# Patient Record
Sex: Female | Born: 1965 | Race: White | Hispanic: No | Marital: Single
Health system: Southern US, Academic
[De-identification: ages and names within clinical notes are randomized; demographics above are authoritative.]

## PROBLEM LIST (undated history)

## (undated) ENCOUNTER — Encounter: Attending: Hematology & Oncology | Primary: Hematology & Oncology

## (undated) ENCOUNTER — Ambulatory Visit

## (undated) ENCOUNTER — Telehealth

## (undated) ENCOUNTER — Encounter: Attending: Adult Health | Primary: Adult Health

## (undated) ENCOUNTER — Encounter

## (undated) ENCOUNTER — Ambulatory Visit: Payer: MEDICARE

## (undated) ENCOUNTER — Telehealth: Attending: Hematology & Oncology | Primary: Hematology & Oncology

## (undated) ENCOUNTER — Telehealth: Attending: Adult Health | Primary: Adult Health

## (undated) ENCOUNTER — Ambulatory Visit: Payer: MEDICARE | Attending: Physical Medicine & Rehabilitation | Primary: Physical Medicine & Rehabilitation

## (undated) ENCOUNTER — Non-Acute Institutional Stay: Payer: MEDICARE

## (undated) ENCOUNTER — Ambulatory Visit: Attending: Hematology & Oncology | Primary: Hematology & Oncology

## (undated) ENCOUNTER — Encounter: Attending: Physical Medicine & Rehabilitation | Primary: Physical Medicine & Rehabilitation

## (undated) ENCOUNTER — Encounter: Payer: MEDICARE | Attending: Hematology & Oncology | Primary: Hematology & Oncology

## (undated) ENCOUNTER — Telehealth: Attending: Family Medicine | Primary: Family Medicine

## (undated) ENCOUNTER — Telehealth: Attending: Hematology | Primary: Hematology

## (undated) ENCOUNTER — Ambulatory Visit: Attending: Adult Health | Primary: Adult Health

## (undated) ENCOUNTER — Ambulatory Visit: Payer: MEDICARE | Attending: Hematology & Oncology | Primary: Hematology & Oncology

## (undated) ENCOUNTER — Ambulatory Visit: Payer: MEDICARE | Attending: Physician Assistant | Primary: Physician Assistant

## (undated) ENCOUNTER — Ambulatory Visit: Payer: Medicare (Managed Care)

## (undated) ENCOUNTER — Encounter: Payer: MEDICARE | Attending: Urology | Primary: Urology

## (undated) ENCOUNTER — Ambulatory Visit: Payer: MEDICARE | Attending: Hematology | Primary: Hematology

## (undated) ENCOUNTER — Encounter: Payer: MEDICARE | Attending: Anesthesiology | Primary: Anesthesiology

## (undated) DIAGNOSIS — D759 Disease of blood and blood-forming organs, unspecified: Secondary | ICD-10-CM

## (undated) DIAGNOSIS — N390 Urinary tract infection, site not specified: Secondary | ICD-10-CM

## (undated) DIAGNOSIS — J449 Chronic obstructive pulmonary disease, unspecified: Secondary | ICD-10-CM

## (undated) DIAGNOSIS — F419 Anxiety disorder, unspecified: Secondary | ICD-10-CM

## (undated) DIAGNOSIS — F329 Major depressive disorder, single episode, unspecified: Secondary | ICD-10-CM

## (undated) DIAGNOSIS — M545 Low back pain, unspecified: Secondary | ICD-10-CM

## (undated) DIAGNOSIS — F32A Depression, unspecified: Secondary | ICD-10-CM

## (undated) DIAGNOSIS — N189 Chronic kidney disease, unspecified: Secondary | ICD-10-CM

## (undated) DIAGNOSIS — E119 Type 2 diabetes mellitus without complications: Secondary | ICD-10-CM

## (undated) DIAGNOSIS — I10 Essential (primary) hypertension: Secondary | ICD-10-CM

## (undated) DIAGNOSIS — R5383 Other fatigue: Secondary | ICD-10-CM

## (undated) DIAGNOSIS — R0683 Snoring: Secondary | ICD-10-CM

## (undated) DIAGNOSIS — D6800 Von Willebrand disease, unspecified: Secondary | ICD-10-CM

## (undated) DIAGNOSIS — R059 Cough, unspecified: Secondary | ICD-10-CM

## (undated) DIAGNOSIS — R0602 Shortness of breath: Secondary | ICD-10-CM

## (undated) DIAGNOSIS — H5509 Other forms of nystagmus: Secondary | ICD-10-CM

## (undated) DIAGNOSIS — R739 Hyperglycemia, unspecified: Secondary | ICD-10-CM

## (undated) DIAGNOSIS — R109 Unspecified abdominal pain: Secondary | ICD-10-CM

## (undated) DIAGNOSIS — M542 Cervicalgia: Secondary | ICD-10-CM

## (undated) DIAGNOSIS — J3489 Other specified disorders of nose and nasal sinuses: Secondary | ICD-10-CM

## (undated) DIAGNOSIS — Z720 Tobacco use: Secondary | ICD-10-CM

## (undated) DIAGNOSIS — J45909 Unspecified asthma, uncomplicated: Secondary | ICD-10-CM

## (undated) DIAGNOSIS — D699 Hemorrhagic condition, unspecified: Secondary | ICD-10-CM

## (undated) DIAGNOSIS — R51 Headache: Secondary | ICD-10-CM

## (undated) DIAGNOSIS — R928 Other abnormal and inconclusive findings on diagnostic imaging of breast: Secondary | ICD-10-CM

## (undated) HISTORY — DX: Von Willebrand disease, unspecified: D68.00

## (undated) HISTORY — DX: Chronic obstructive pulmonary disease, unspecified: J44.9

## (undated) HISTORY — DX: Snoring: R06.83

## (undated) HISTORY — DX: Tobacco use: Z72.0

## (undated) HISTORY — DX: Other fatigue: R53.83

## (undated) HISTORY — DX: Other forms of nystagmus: H55.09

## (undated) HISTORY — DX: Hyperglycemia, unspecified: R73.9

## (undated) HISTORY — PX: SHOULDER SURGERY: SHX246

## (undated) HISTORY — DX: Major depressive disorder, single episode, unspecified: F32.9

## (undated) HISTORY — DX: Urinary tract infection, site not specified: N39.0

## (undated) HISTORY — PX: APPENDECTOMY: SHX54

## (undated) HISTORY — DX: Low back pain, unspecified: M54.50

## (undated) HISTORY — DX: Cough, unspecified: R05.9

## (undated) HISTORY — DX: Cervicalgia: M54.2

## (undated) HISTORY — DX: Von Willebrand's disease: D68.0

## (undated) HISTORY — PX: BLADDER SURGERY: SHX569

## (undated) HISTORY — DX: Other specified disorders of nose and nasal sinuses: J34.89

## (undated) HISTORY — DX: Shortness of breath: R06.02

## (undated) HISTORY — DX: Headache: R51

## (undated) HISTORY — DX: Other abnormal and inconclusive findings on diagnostic imaging of breast: R92.8

## (undated) HISTORY — PX: ABDOMINAL HYSTERECTOMY: SHX81

## (undated) HISTORY — DX: Unspecified abdominal pain: R10.9

## (undated) HISTORY — PX: BREAST BIOPSY: SHX20

## (undated) HISTORY — PX: REPLACEMENT TOTAL KNEE: SUR1224

## (undated) HISTORY — PX: CARDIAC CATHETERIZATION: SHX172

## (undated) MED ORDER — DOXYCYCLINE HYCLATE 100 MG TABLET
Freq: Two times a day (BID) | ORAL | 0 days
Start: ? — End: 2020-11-22

---
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
## 1998-10-28 DIAGNOSIS — R519 Headache, unspecified: Secondary | ICD-10-CM

## 1998-10-28 HISTORY — DX: Headache, unspecified: R51.9

## 2000-10-28 HISTORY — PX: ABDOMINAL HYSTERECTOMY: SUR658

## 2004-07-28 ENCOUNTER — Ambulatory Visit: Payer: Self-pay | Admitting: Internal Medicine

## 2004-08-16 ENCOUNTER — Inpatient Hospital Stay: Payer: Self-pay | Admitting: Unknown Physician Specialty

## 2004-08-28 ENCOUNTER — Ambulatory Visit: Payer: Self-pay | Admitting: Internal Medicine

## 2004-09-27 ENCOUNTER — Ambulatory Visit: Payer: Self-pay | Admitting: Internal Medicine

## 2004-10-28 ENCOUNTER — Ambulatory Visit: Payer: Self-pay | Admitting: Internal Medicine

## 2004-11-01 ENCOUNTER — Ambulatory Visit: Payer: Self-pay | Admitting: Family Medicine

## 2004-11-20 ENCOUNTER — Ambulatory Visit: Payer: Self-pay | Admitting: Pain Medicine

## 2004-11-28 ENCOUNTER — Ambulatory Visit: Payer: Self-pay | Admitting: Internal Medicine

## 2004-12-18 ENCOUNTER — Ambulatory Visit: Payer: Self-pay | Admitting: Pain Medicine

## 2004-12-24 ENCOUNTER — Ambulatory Visit: Payer: Self-pay | Admitting: Pain Medicine

## 2004-12-26 ENCOUNTER — Ambulatory Visit: Payer: Self-pay | Admitting: Internal Medicine

## 2005-01-31 ENCOUNTER — Ambulatory Visit: Payer: Self-pay | Admitting: Pain Medicine

## 2005-02-04 ENCOUNTER — Ambulatory Visit: Payer: Self-pay | Admitting: Pain Medicine

## 2005-02-06 ENCOUNTER — Ambulatory Visit: Payer: Self-pay | Admitting: Pain Medicine

## 2005-02-11 ENCOUNTER — Ambulatory Visit: Payer: Self-pay | Admitting: Internal Medicine

## 2005-02-25 ENCOUNTER — Ambulatory Visit: Payer: Self-pay | Admitting: Internal Medicine

## 2005-03-28 ENCOUNTER — Ambulatory Visit: Payer: Self-pay | Admitting: Internal Medicine

## 2005-04-27 ENCOUNTER — Ambulatory Visit: Payer: Self-pay | Admitting: Internal Medicine

## 2005-05-28 ENCOUNTER — Ambulatory Visit: Payer: Self-pay | Admitting: Internal Medicine

## 2005-06-28 ENCOUNTER — Ambulatory Visit: Payer: Self-pay | Admitting: Internal Medicine

## 2005-07-28 ENCOUNTER — Ambulatory Visit: Payer: Self-pay | Admitting: Internal Medicine

## 2005-08-28 ENCOUNTER — Ambulatory Visit: Payer: Self-pay | Admitting: Internal Medicine

## 2005-09-27 ENCOUNTER — Ambulatory Visit: Payer: Self-pay | Admitting: Internal Medicine

## 2005-10-28 ENCOUNTER — Ambulatory Visit: Payer: Self-pay | Admitting: Internal Medicine

## 2005-12-02 ENCOUNTER — Ambulatory Visit: Payer: Self-pay | Admitting: Internal Medicine

## 2005-12-22 ENCOUNTER — Emergency Department: Payer: Self-pay | Admitting: Emergency Medicine

## 2005-12-30 ENCOUNTER — Ambulatory Visit: Payer: Self-pay | Admitting: Internal Medicine

## 2006-01-13 ENCOUNTER — Ambulatory Visit: Payer: Self-pay | Admitting: Unknown Physician Specialty

## 2006-01-30 ENCOUNTER — Ambulatory Visit: Payer: Self-pay | Admitting: Unknown Physician Specialty

## 2006-02-09 ENCOUNTER — Emergency Department: Payer: Self-pay | Admitting: Emergency Medicine

## 2006-02-10 ENCOUNTER — Ambulatory Visit: Payer: Self-pay | Admitting: Internal Medicine

## 2006-02-25 ENCOUNTER — Ambulatory Visit: Payer: Self-pay | Admitting: Unknown Physician Specialty

## 2006-02-25 ENCOUNTER — Ambulatory Visit: Payer: Self-pay | Admitting: Internal Medicine

## 2006-03-28 ENCOUNTER — Ambulatory Visit: Payer: Self-pay | Admitting: Internal Medicine

## 2006-04-09 ENCOUNTER — Encounter: Payer: Self-pay | Admitting: Urology

## 2006-04-27 ENCOUNTER — Encounter: Payer: Self-pay | Admitting: Urology

## 2006-05-06 ENCOUNTER — Inpatient Hospital Stay: Payer: Self-pay | Admitting: Internal Medicine

## 2006-05-06 ENCOUNTER — Other Ambulatory Visit: Payer: Self-pay

## 2006-05-12 ENCOUNTER — Ambulatory Visit: Payer: Self-pay | Admitting: Internal Medicine

## 2006-06-02 ENCOUNTER — Ambulatory Visit: Payer: Self-pay | Admitting: Internal Medicine

## 2006-06-11 ENCOUNTER — Ambulatory Visit: Payer: Self-pay | Admitting: Gastroenterology

## 2006-06-28 ENCOUNTER — Ambulatory Visit: Payer: Self-pay | Admitting: Internal Medicine

## 2006-06-28 ENCOUNTER — Emergency Department: Payer: Self-pay | Admitting: Unknown Physician Specialty

## 2006-07-28 ENCOUNTER — Ambulatory Visit: Payer: Self-pay | Admitting: Internal Medicine

## 2006-09-03 ENCOUNTER — Ambulatory Visit: Payer: Self-pay | Admitting: Internal Medicine

## 2006-09-05 ENCOUNTER — Emergency Department: Payer: Self-pay | Admitting: Emergency Medicine

## 2006-09-07 ENCOUNTER — Emergency Department: Payer: Self-pay | Admitting: Emergency Medicine

## 2006-09-27 ENCOUNTER — Ambulatory Visit: Payer: Self-pay | Admitting: Internal Medicine

## 2006-11-11 ENCOUNTER — Ambulatory Visit: Payer: Self-pay

## 2006-11-14 ENCOUNTER — Ambulatory Visit: Payer: Self-pay | Admitting: Internal Medicine

## 2006-11-28 ENCOUNTER — Ambulatory Visit: Payer: Self-pay | Admitting: Internal Medicine

## 2006-12-13 ENCOUNTER — Other Ambulatory Visit: Payer: Self-pay

## 2006-12-13 ENCOUNTER — Emergency Department: Payer: Self-pay

## 2007-01-02 ENCOUNTER — Ambulatory Visit: Payer: Self-pay | Admitting: Internal Medicine

## 2007-01-10 ENCOUNTER — Emergency Department: Payer: Self-pay | Admitting: Emergency Medicine

## 2007-01-12 ENCOUNTER — Emergency Department: Payer: Self-pay | Admitting: Emergency Medicine

## 2007-01-13 ENCOUNTER — Other Ambulatory Visit: Payer: Self-pay

## 2007-01-13 ENCOUNTER — Inpatient Hospital Stay: Payer: Self-pay | Admitting: Unknown Physician Specialty

## 2007-01-13 ENCOUNTER — Ambulatory Visit: Payer: Self-pay | Admitting: Internal Medicine

## 2007-01-20 ENCOUNTER — Inpatient Hospital Stay: Payer: Self-pay | Admitting: Internal Medicine

## 2007-01-27 ENCOUNTER — Ambulatory Visit: Payer: Self-pay | Admitting: Internal Medicine

## 2007-02-03 ENCOUNTER — Emergency Department: Payer: Self-pay | Admitting: Emergency Medicine

## 2007-02-03 ENCOUNTER — Other Ambulatory Visit: Payer: Self-pay

## 2007-02-26 ENCOUNTER — Ambulatory Visit: Payer: Self-pay | Admitting: Internal Medicine

## 2007-03-29 ENCOUNTER — Ambulatory Visit: Payer: Self-pay | Admitting: Internal Medicine

## 2007-04-24 ENCOUNTER — Ambulatory Visit: Payer: Self-pay | Admitting: Internal Medicine

## 2007-04-24 ENCOUNTER — Emergency Department: Payer: Self-pay | Admitting: Emergency Medicine

## 2007-04-28 ENCOUNTER — Ambulatory Visit: Payer: Self-pay | Admitting: Internal Medicine

## 2007-05-29 ENCOUNTER — Ambulatory Visit: Payer: Self-pay | Admitting: Internal Medicine

## 2007-06-29 ENCOUNTER — Ambulatory Visit: Payer: Self-pay | Admitting: Internal Medicine

## 2007-07-29 ENCOUNTER — Ambulatory Visit: Payer: Self-pay | Admitting: Internal Medicine

## 2007-08-29 ENCOUNTER — Ambulatory Visit: Payer: Self-pay | Admitting: Internal Medicine

## 2007-09-28 ENCOUNTER — Ambulatory Visit: Payer: Self-pay | Admitting: Internal Medicine

## 2007-10-01 ENCOUNTER — Ambulatory Visit: Payer: Self-pay | Admitting: Internal Medicine

## 2007-10-29 ENCOUNTER — Ambulatory Visit: Payer: Self-pay | Admitting: Internal Medicine

## 2007-11-29 ENCOUNTER — Ambulatory Visit: Payer: Self-pay | Admitting: Internal Medicine

## 2007-12-07 ENCOUNTER — Ambulatory Visit: Payer: Self-pay | Admitting: Unknown Physician Specialty

## 2007-12-14 ENCOUNTER — Other Ambulatory Visit: Payer: Self-pay

## 2007-12-14 ENCOUNTER — Ambulatory Visit: Payer: Self-pay | Admitting: Unknown Physician Specialty

## 2007-12-18 ENCOUNTER — Inpatient Hospital Stay: Payer: Self-pay | Admitting: Unknown Physician Specialty

## 2007-12-27 ENCOUNTER — Ambulatory Visit: Payer: Self-pay | Admitting: Internal Medicine

## 2008-01-27 ENCOUNTER — Ambulatory Visit: Payer: Self-pay | Admitting: Internal Medicine

## 2008-02-04 ENCOUNTER — Ambulatory Visit: Payer: Self-pay | Admitting: Family Medicine

## 2008-02-25 ENCOUNTER — Other Ambulatory Visit: Payer: Self-pay

## 2008-02-25 ENCOUNTER — Emergency Department: Payer: Self-pay | Admitting: Internal Medicine

## 2008-02-26 ENCOUNTER — Ambulatory Visit: Payer: Self-pay | Admitting: Internal Medicine

## 2008-02-26 ENCOUNTER — Other Ambulatory Visit: Payer: Self-pay

## 2008-02-26 ENCOUNTER — Emergency Department: Payer: Self-pay | Admitting: Emergency Medicine

## 2008-02-29 ENCOUNTER — Other Ambulatory Visit: Payer: Self-pay

## 2008-02-29 ENCOUNTER — Inpatient Hospital Stay: Payer: Self-pay | Admitting: Internal Medicine

## 2008-03-31 ENCOUNTER — Ambulatory Visit: Payer: Self-pay | Admitting: Internal Medicine

## 2008-04-27 ENCOUNTER — Ambulatory Visit: Payer: Self-pay | Admitting: Internal Medicine

## 2008-08-08 ENCOUNTER — Inpatient Hospital Stay: Payer: Self-pay | Admitting: Unknown Physician Specialty

## 2008-08-28 ENCOUNTER — Ambulatory Visit: Payer: Self-pay | Admitting: Internal Medicine

## 2008-09-15 ENCOUNTER — Ambulatory Visit: Payer: Self-pay | Admitting: Internal Medicine

## 2008-09-27 ENCOUNTER — Ambulatory Visit: Payer: Self-pay | Admitting: Internal Medicine

## 2008-10-28 ENCOUNTER — Ambulatory Visit: Payer: Self-pay | Admitting: Internal Medicine

## 2008-11-28 ENCOUNTER — Ambulatory Visit: Payer: Self-pay | Admitting: Internal Medicine

## 2008-12-26 ENCOUNTER — Ambulatory Visit: Payer: Self-pay | Admitting: Internal Medicine

## 2009-01-26 ENCOUNTER — Ambulatory Visit: Payer: Self-pay | Admitting: Internal Medicine

## 2009-02-12 IMAGING — CT CT OF THE LEFT KNEE WITHOUT CONTRAST
1 series · 12 of 14 positions shown, 15 images · non-contrast
Comparison: Correlated with recent plain film examination.

REASON FOR EXAM: fall  x ray shows depression   laterally
COMMENTS:

PROCEDURE:     CT  - CT KNEE LEFT WO  - January 10, 2007  [DATE]
RESULT:
HISTORY: Fall.
TECHNIQUE: Contiguous axial CT images of the LEFT knee were obtained without
intravenous contrast.  Images were viewed on a workstation in sagittal and
coronal planes for interpretation. In addition, a 3D model was
reconstructed.

[Series 4: bone windows · axial · 0.39mm/px · z∈[+220,+364]mm · 12 of 58 slices shown, 15 images]
[im 5/58  soft-tissue]
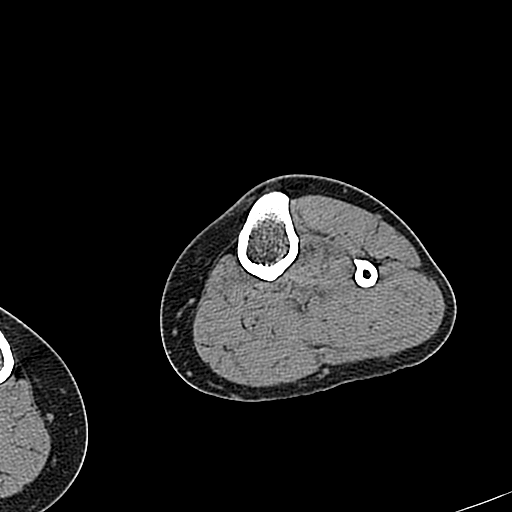
[im 5/58  bone]
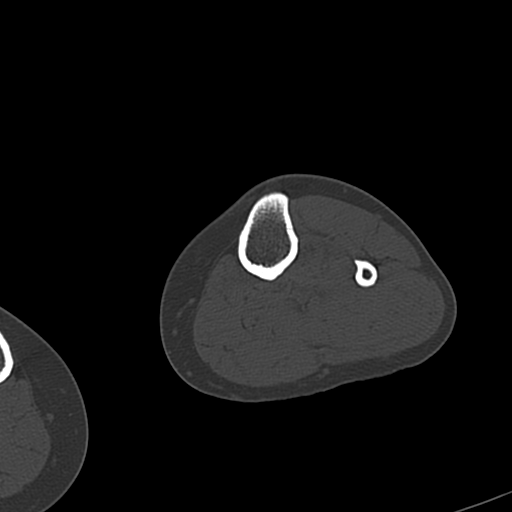
[im 9/58  bone]
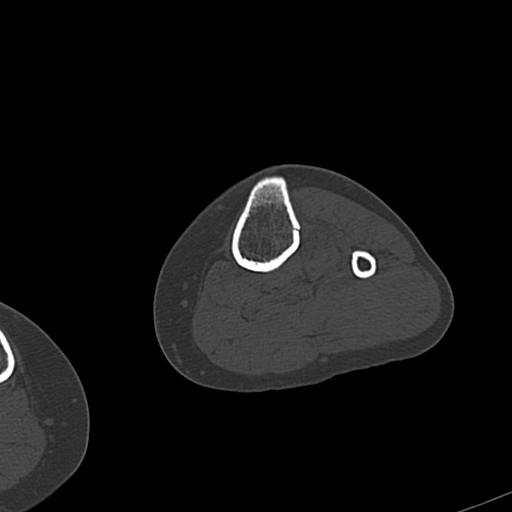
[im 14/58  bone]
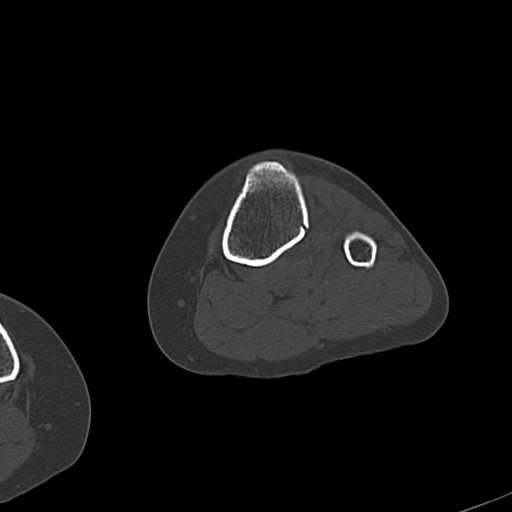
[im 18/58  bone]
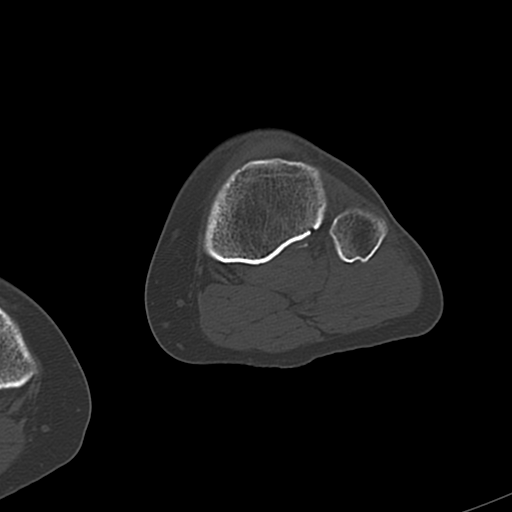
[im 22/58  soft-tissue]
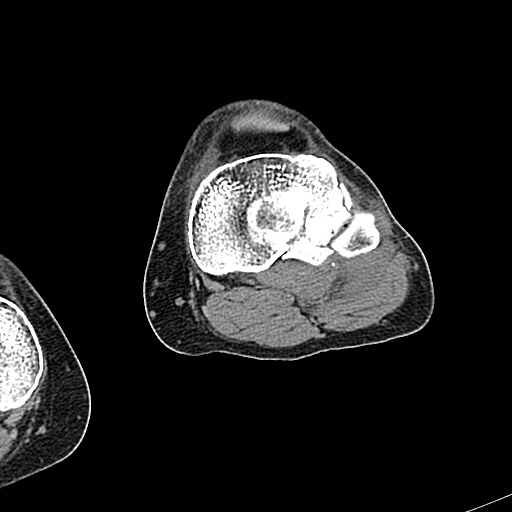
[im 22/58  bone]
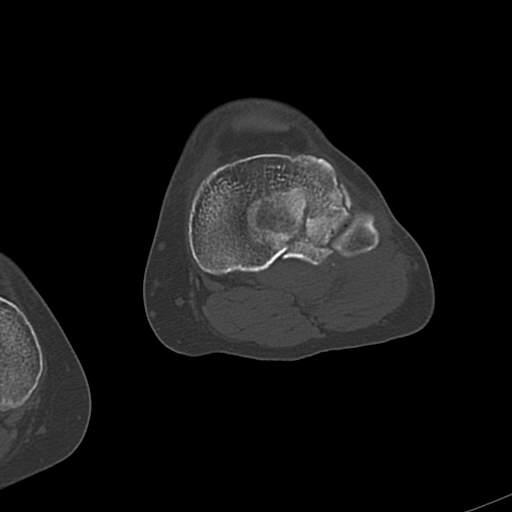
[im 27/58  bone]
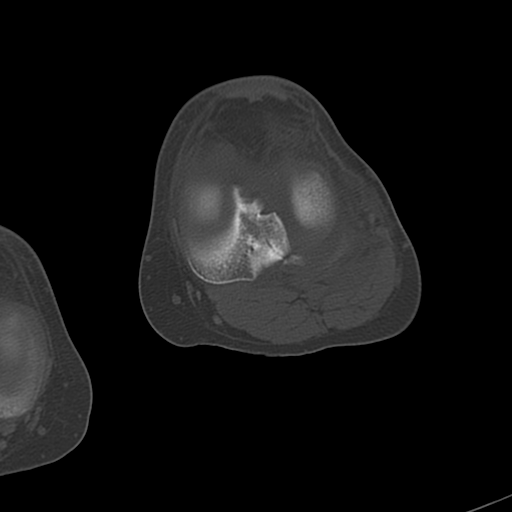
[im 31/58  bone]
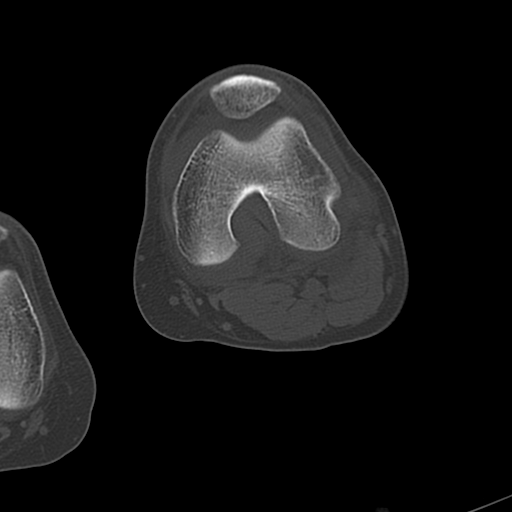
[im 36/58  bone]
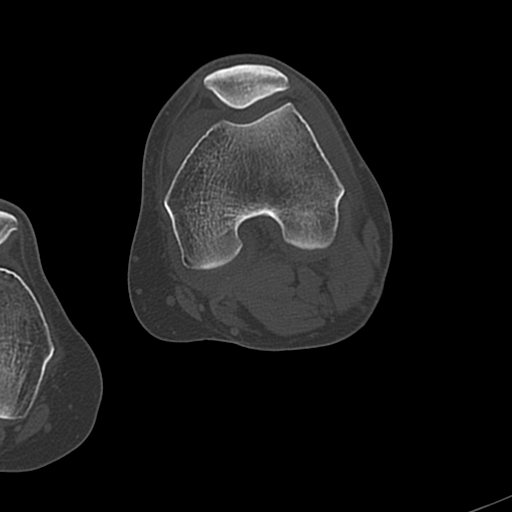
[im 40/58  soft-tissue]
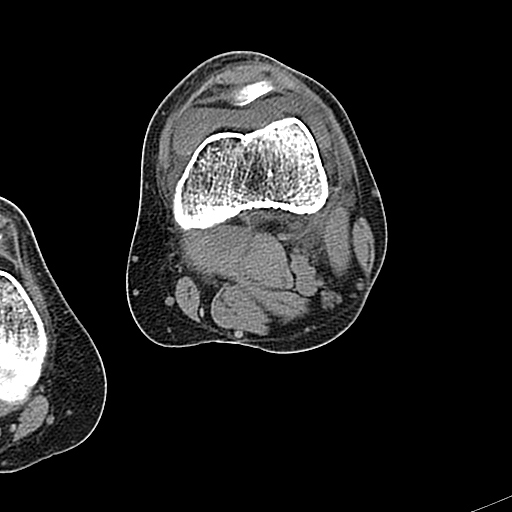
[im 40/58  bone]
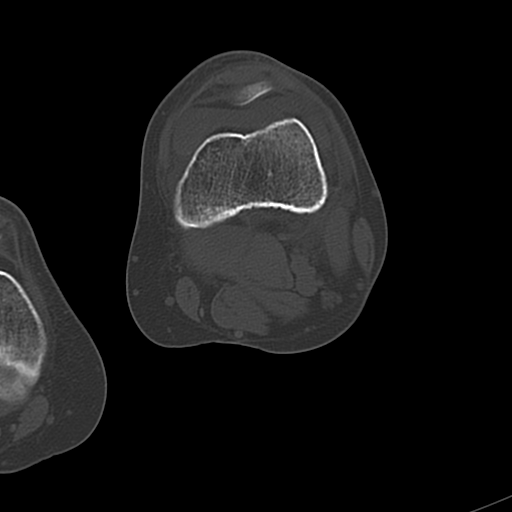
[im 44/58  bone]
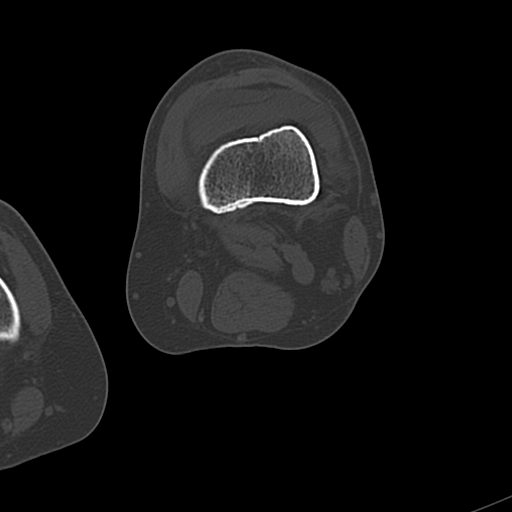
[im 49/58  bone]
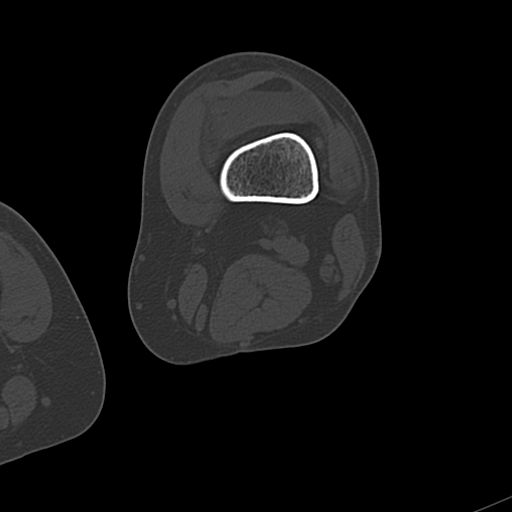
[im 53/58  bone]
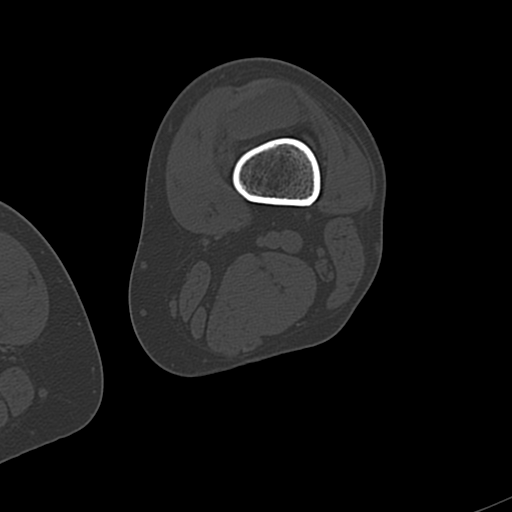

[12 of 14 positions shown; findings below may reference images not displayed]

FINDINGS: There is a lateral tibial plateau fracture on the LEFT with a
cm fragment of the lateral tibial plateau depressed approximately 7 mm.
There is a second fragment posteriorly which is minimally displaced. A
portion of the fracture extends across the midline toward the medial tibial
plateau. The fracture also involves the lateral aspect of the tibial spine
near the ACL insertion.

There is a joint effusion present. No other fractures are identified.
IMPRESSION: 1)There is a comminuted lateral tibial plateau fracture with a 2.2 cm
central fragment, which is depressed 7 mm.  There is also a small posterior
fragment.  A portion of the fracture extends across the mid tibia and
extends to the region of the tibial spine near the ACL insertion.

## 2009-03-28 ENCOUNTER — Ambulatory Visit: Payer: Self-pay | Admitting: Internal Medicine

## 2009-04-17 ENCOUNTER — Ambulatory Visit: Payer: Self-pay | Admitting: Internal Medicine

## 2009-04-27 ENCOUNTER — Ambulatory Visit: Payer: Self-pay | Admitting: Internal Medicine

## 2009-05-28 ENCOUNTER — Ambulatory Visit: Payer: Self-pay | Admitting: Internal Medicine

## 2009-06-28 ENCOUNTER — Ambulatory Visit: Payer: Self-pay | Admitting: Internal Medicine

## 2009-07-28 ENCOUNTER — Ambulatory Visit: Payer: Self-pay | Admitting: Internal Medicine

## 2009-08-28 ENCOUNTER — Ambulatory Visit: Payer: Self-pay | Admitting: Internal Medicine

## 2009-09-27 ENCOUNTER — Ambulatory Visit: Payer: Self-pay | Admitting: Internal Medicine

## 2009-10-04 ENCOUNTER — Emergency Department: Payer: Self-pay | Admitting: Emergency Medicine

## 2009-10-10 ENCOUNTER — Ambulatory Visit: Payer: Self-pay | Admitting: Family Medicine

## 2009-10-28 ENCOUNTER — Ambulatory Visit: Payer: Self-pay | Admitting: Internal Medicine

## 2009-11-28 ENCOUNTER — Ambulatory Visit: Payer: Self-pay | Admitting: Internal Medicine

## 2009-12-26 ENCOUNTER — Ambulatory Visit: Payer: Self-pay | Admitting: Internal Medicine

## 2010-01-09 ENCOUNTER — Ambulatory Visit: Payer: Self-pay | Admitting: Internal Medicine

## 2010-01-26 ENCOUNTER — Ambulatory Visit: Payer: Self-pay | Admitting: Internal Medicine

## 2010-03-16 ENCOUNTER — Ambulatory Visit: Payer: Self-pay | Admitting: Internal Medicine

## 2010-03-28 ENCOUNTER — Ambulatory Visit: Payer: Self-pay | Admitting: Internal Medicine

## 2010-04-04 ENCOUNTER — Emergency Department: Payer: Self-pay | Admitting: Emergency Medicine

## 2010-04-27 ENCOUNTER — Ambulatory Visit: Payer: Self-pay | Admitting: Internal Medicine

## 2010-05-15 ENCOUNTER — Ambulatory Visit: Payer: Self-pay | Admitting: Internal Medicine

## 2010-05-28 ENCOUNTER — Ambulatory Visit: Payer: Self-pay | Admitting: Internal Medicine

## 2010-05-29 ENCOUNTER — Ambulatory Visit: Payer: Self-pay | Admitting: Internal Medicine

## 2010-05-31 ENCOUNTER — Ambulatory Visit: Payer: Self-pay | Admitting: Urology

## 2010-06-12 ENCOUNTER — Ambulatory Visit: Payer: Self-pay | Admitting: Urology

## 2010-06-28 ENCOUNTER — Ambulatory Visit: Payer: Self-pay | Admitting: Internal Medicine

## 2010-07-28 ENCOUNTER — Ambulatory Visit: Payer: Self-pay | Admitting: Internal Medicine

## 2010-08-24 ENCOUNTER — Ambulatory Visit: Payer: Self-pay | Admitting: Unknown Physician Specialty

## 2010-08-25 ENCOUNTER — Ambulatory Visit: Payer: Self-pay | Admitting: Unknown Physician Specialty

## 2010-08-28 ENCOUNTER — Ambulatory Visit: Payer: Self-pay | Admitting: Internal Medicine

## 2010-09-27 ENCOUNTER — Ambulatory Visit: Payer: Self-pay | Admitting: Internal Medicine

## 2010-10-28 ENCOUNTER — Ambulatory Visit: Payer: Self-pay | Admitting: Internal Medicine

## 2010-11-28 ENCOUNTER — Ambulatory Visit: Payer: Self-pay | Admitting: Internal Medicine

## 2010-12-27 ENCOUNTER — Ambulatory Visit: Payer: Self-pay | Admitting: Internal Medicine

## 2011-01-27 ENCOUNTER — Ambulatory Visit: Payer: Self-pay | Admitting: Internal Medicine

## 2011-01-28 ENCOUNTER — Ambulatory Visit: Payer: Self-pay | Admitting: Unknown Physician Specialty

## 2011-01-28 ENCOUNTER — Ambulatory Visit: Payer: Self-pay | Admitting: Urology

## 2011-01-31 ENCOUNTER — Ambulatory Visit: Payer: Self-pay | Admitting: Internal Medicine

## 2011-01-31 ENCOUNTER — Ambulatory Visit: Payer: Self-pay | Admitting: Neurology

## 2011-02-26 ENCOUNTER — Ambulatory Visit: Payer: Self-pay | Admitting: Internal Medicine

## 2011-02-27 ENCOUNTER — Ambulatory Visit: Payer: Self-pay | Admitting: Family Medicine

## 2011-03-29 ENCOUNTER — Ambulatory Visit: Payer: Self-pay | Admitting: Internal Medicine

## 2011-04-28 ENCOUNTER — Ambulatory Visit: Payer: Self-pay | Admitting: Internal Medicine

## 2011-06-14 ENCOUNTER — Ambulatory Visit: Payer: Self-pay | Admitting: Internal Medicine

## 2011-06-29 ENCOUNTER — Ambulatory Visit: Payer: Self-pay | Admitting: Internal Medicine

## 2011-07-29 ENCOUNTER — Ambulatory Visit: Payer: Self-pay | Admitting: Internal Medicine

## 2011-08-15 ENCOUNTER — Ambulatory Visit: Payer: Self-pay | Admitting: Internal Medicine

## 2011-08-17 ENCOUNTER — Emergency Department: Payer: Self-pay | Admitting: *Deleted

## 2011-08-29 ENCOUNTER — Ambulatory Visit: Payer: Self-pay | Admitting: Internal Medicine

## 2011-09-28 ENCOUNTER — Ambulatory Visit: Payer: Self-pay | Admitting: Internal Medicine

## 2011-10-29 ENCOUNTER — Ambulatory Visit: Payer: Self-pay | Admitting: Internal Medicine

## 2011-11-12 ENCOUNTER — Ambulatory Visit: Payer: Self-pay | Admitting: Family Medicine

## 2011-11-14 ENCOUNTER — Ambulatory Visit: Payer: Self-pay | Admitting: Internal Medicine

## 2011-11-14 LAB — CBC CANCER CENTER
Basophil #: 0.1 x10 3/mm (ref 0.0–0.1)
Basophil %: 0.9 %
Eosinophil #: 0.1 x10 3/mm (ref 0.0–0.7)
Eosinophil %: 0.5 %
HCT: 40.3 % (ref 35.0–47.0)
Lymphocyte #: 1.5 x10 3/mm (ref 1.0–3.6)
Lymphocyte %: 10.6 %
MCH: 34 pg (ref 26.0–34.0)
MCHC: 33.7 g/dL (ref 32.0–36.0)
MCV: 101 fL — ABNORMAL HIGH (ref 80–100)
Monocyte #: 0.6 x10 3/mm (ref 0.0–0.7)
Neutrophil #: 11.8 x10 3/mm — ABNORMAL HIGH (ref 1.4–6.5)
Neutrophil %: 84.1 %
RBC: 4 10*6/uL (ref 3.80–5.20)
RDW: 15.6 % — ABNORMAL HIGH (ref 11.5–14.5)

## 2011-11-20 ENCOUNTER — Other Ambulatory Visit: Payer: Self-pay | Admitting: Unknown Physician Specialty

## 2011-11-21 ENCOUNTER — Other Ambulatory Visit: Payer: Self-pay

## 2011-11-21 LAB — BASIC METABOLIC PANEL
BUN: 13 mg/dL (ref 7–18)
Co2: 28 mmol/L (ref 21–32)
EGFR (African American): >60
EGFR (Non-African Amer.): >60
Glucose: 91 mg/dL (ref 65–99)
Osmolality: 279 (ref 275–301)
Potassium: 3.5 mmol/L (ref 3.5–5.1)
Sodium: 140 mmol/L (ref 136–145)

## 2011-11-21 LAB — CLOSTRIDIUM DIFFICILE BY PCR

## 2011-11-24 LAB — STOOL CULTURE

## 2011-11-29 ENCOUNTER — Ambulatory Visit: Payer: Self-pay | Admitting: Internal Medicine

## 2011-12-06 ENCOUNTER — Ambulatory Visit: Payer: Self-pay | Admitting: Unknown Physician Specialty

## 2011-12-12 ENCOUNTER — Ambulatory Visit: Payer: Self-pay | Admitting: Internal Medicine

## 2011-12-12 LAB — CBC CANCER CENTER
Basophil #: 0.1 x10 3/mm (ref 0.0–0.1)
Basophil %: 1.1 %
Eosinophil #: 0.2 x10 3/mm (ref 0.0–0.7)
HCT: 41.7 % (ref 35.0–47.0)
Lymphocyte %: 16 %
MCHC: 34.1 g/dL (ref 32.0–36.0)
MCV: 101 fL — ABNORMAL HIGH (ref 80–100)
Monocyte #: 0.5 x10 3/mm (ref 0.0–0.7)
Monocyte %: 5.5 %
Neutrophil #: 7.1 x10 3/mm — ABNORMAL HIGH (ref 1.4–6.5)
Neutrophil %: 75.8 %
Platelet: 321 x10 3/mm (ref 150–440)
RBC: 4.13 10*6/uL (ref 3.80–5.20)
RDW: 15.4 % — ABNORMAL HIGH (ref 11.5–14.5)
WBC: 9.4 x10 3/mm (ref 3.6–11.0)

## 2011-12-16 ENCOUNTER — Ambulatory Visit: Payer: Self-pay | Admitting: Unknown Physician Specialty

## 2011-12-27 ENCOUNTER — Ambulatory Visit: Payer: Self-pay | Admitting: Internal Medicine

## 2012-01-08 ENCOUNTER — Ambulatory Visit: Payer: Self-pay | Admitting: Internal Medicine

## 2012-01-08 LAB — CBC CANCER CENTER
Basophil #: 0 x10 3/mm (ref 0.0–0.1)
Eosinophil #: 0.2 x10 3/mm (ref 0.0–0.7)
Eosinophil %: 2.8 %
HCT: 42.7 % (ref 35.0–47.0)
Lymphocyte #: 1.2 x10 3/mm (ref 1.0–3.6)
MCH: 33.5 pg (ref 26.0–34.0)
MCHC: 34.3 g/dL (ref 32.0–36.0)
MCV: 98 fL (ref 80–100)
Monocyte %: 7.8 %
Neutrophil #: 5.4 x10 3/mm (ref 1.4–6.5)
Platelet: 406 x10 3/mm (ref 150–440)
RBC: 4.38 10*6/uL (ref 3.80–5.20)
RDW: 14.2 % (ref 11.5–14.5)
WBC: 7.4 x10 3/mm (ref 3.6–11.0)

## 2012-01-08 LAB — APTT: Activated PTT: 33.2 secs (ref 23.6–35.9)

## 2012-01-09 ENCOUNTER — Ambulatory Visit: Payer: Self-pay | Admitting: Unknown Physician Specialty

## 2012-01-13 LAB — PATHOLOGY REPORT

## 2012-01-14 ENCOUNTER — Emergency Department: Payer: Self-pay | Admitting: Emergency Medicine

## 2012-01-14 LAB — BASIC METABOLIC PANEL
BUN: 12 mg/dL (ref 7–18)
Calcium, Total: 8.8 mg/dL (ref 8.5–10.1)
Co2: 24 mmol/L (ref 21–32)
Creatinine: 0.92 mg/dL (ref 0.60–1.30)
EGFR (African American): >60
EGFR (Non-African Amer.): >60
Osmolality: 288 (ref 275–301)
Potassium: 3.8 mmol/L (ref 3.5–5.1)
Sodium: 143 mmol/L (ref 136–145)

## 2012-01-14 LAB — CBC
MCH: 32.9 pg (ref 26.0–34.0)
MCHC: 34 g/dL (ref 32.0–36.0)
RDW: 13.4 % (ref 11.5–14.5)

## 2012-01-14 LAB — TROPONIN I: Troponin-I: <0.02 ng/mL

## 2012-01-27 ENCOUNTER — Ambulatory Visit: Payer: Self-pay | Admitting: Internal Medicine

## 2012-02-07 LAB — CBC CANCER CENTER
Eosinophil #: 0.4 x10 3/mm (ref 0.0–0.7)
Eosinophil %: 5.2 %
HGB: 14.8 g/dL (ref 12.0–16.0)
Lymphocyte #: 1.3 x10 3/mm (ref 1.0–3.6)
MCH: 31.1 pg (ref 26.0–34.0)
Monocyte #: 0.5 x10 3/mm (ref 0.2–0.9)
Monocyte %: 6.4 %
RBC: 4.77 10*6/uL (ref 3.80–5.20)
RDW: 13.3 % (ref 11.5–14.5)
WBC: 8 x10 3/mm (ref 3.6–11.0)

## 2012-02-14 LAB — CBC CANCER CENTER
Basophil #: 0.1 x10 3/mm (ref 0.0–0.1)
HGB: 13.6 g/dL (ref 12.0–16.0)
MCH: 31.4 pg (ref 26.0–34.0)
MCHC: 33.4 g/dL (ref 32.0–36.0)
MCV: 94 fL (ref 80–100)
Monocyte #: 0.6 x10 3/mm (ref 0.2–0.9)
Monocyte %: 8.8 %
Neutrophil #: 3.7 x10 3/mm (ref 1.4–6.5)
Platelet: 399 x10 3/mm (ref 150–440)
RBC: 4.34 10*6/uL (ref 3.80–5.20)

## 2012-02-26 ENCOUNTER — Ambulatory Visit: Payer: Self-pay | Admitting: Internal Medicine

## 2012-02-28 LAB — CBC CANCER CENTER
Basophil #: 0.1 x10 3/mm (ref 0.0–0.1)
Eosinophil #: 0 x10 3/mm (ref 0.0–0.7)
Eosinophil %: 0.1 %
HGB: 14.6 g/dL (ref 12.0–16.0)
Lymphocyte #: 0.9 x10 3/mm — ABNORMAL LOW (ref 1.0–3.6)
MCH: 30.5 pg (ref 26.0–34.0)
MCHC: 33 g/dL (ref 32.0–36.0)
MCV: 93 fL (ref 80–100)
Monocyte #: 0.2 x10 3/mm (ref 0.2–0.9)
Neutrophil #: 8 x10 3/mm — ABNORMAL HIGH (ref 1.4–6.5)
Neutrophil %: 87 %
Platelet: 359 x10 3/mm (ref 150–440)
RBC: 4.8 10*6/uL (ref 3.80–5.20)
WBC: 9.1 x10 3/mm (ref 3.6–11.0)

## 2012-03-05 ENCOUNTER — Ambulatory Visit: Payer: Self-pay | Admitting: Pain Medicine

## 2012-03-17 ENCOUNTER — Ambulatory Visit: Payer: Self-pay | Admitting: Family Medicine

## 2012-03-18 LAB — CBC CANCER CENTER
Eosinophil #: 0.4 x10 3/mm (ref 0.0–0.7)
Eosinophil %: 4.4 %
Lymphocyte #: 1.8 x10 3/mm (ref 1.0–3.6)
Lymphocyte %: 23 %
MCH: 29.2 pg (ref 26.0–34.0)
MCHC: 32.3 g/dL (ref 32.0–36.0)
MCV: 90 fL (ref 80–100)
Monocyte #: 0.6 x10 3/mm (ref 0.2–0.9)
Monocyte %: 7.8 %
Neutrophil %: 63.9 %
Platelet: 322 x10 3/mm (ref 150–440)
RBC: 4.53 10*6/uL (ref 3.80–5.20)
RDW: 14.1 % (ref 11.5–14.5)

## 2012-03-28 ENCOUNTER — Ambulatory Visit: Payer: Self-pay | Admitting: Internal Medicine

## 2012-04-01 LAB — CBC CANCER CENTER
Basophil #: 0 x10 3/mm (ref 0.0–0.1)
Basophil %: 1.1 %
Eosinophil #: 0.1 x10 3/mm (ref 0.0–0.7)
Eosinophil %: 1.8 %
HCT: 40 % (ref 35.0–47.0)
HGB: 13.1 g/dL (ref 12.0–16.0)
Lymphocyte %: 41.5 %
MCH: 28.1 pg (ref 26.0–34.0)
MCV: 86 fL (ref 80–100)
Monocyte %: 8 %
Neutrophil #: 1.9 x10 3/mm (ref 1.4–6.5)
Platelet: 166 x10 3/mm (ref 150–440)
RBC: 4.66 10*6/uL (ref 3.80–5.20)
RDW: 14.5 % (ref 11.5–14.5)
WBC: 4 x10 3/mm (ref 3.6–11.0)

## 2012-04-07 LAB — CBC CANCER CENTER
Basophil %: 0.9 %
Eosinophil #: 0.1 x10 3/mm (ref 0.0–0.7)
Eosinophil %: 0.9 %
Lymphocyte #: 3 x10 3/mm (ref 1.0–3.6)
Lymphocyte %: 49.8 %
Monocyte %: 9.2 %
Neutrophil %: 39.2 %
Platelet: 183 x10 3/mm (ref 150–440)
RDW: 15 % — ABNORMAL HIGH (ref 11.5–14.5)
WBC: 6 x10 3/mm (ref 3.6–11.0)

## 2012-04-13 LAB — CBC CANCER CENTER
Basophil #: 0 x10 3/mm (ref 0.0–0.1)
Basophil %: 0.6 %
Eosinophil #: 0 x10 3/mm (ref 0.0–0.7)
HCT: 39.2 % (ref 35.0–47.0)
HGB: 12.7 g/dL (ref 12.0–16.0)
Lymphocyte #: 2.3 x10 3/mm (ref 1.0–3.6)
Lymphocyte %: 65.6 %
MCH: 27.6 pg (ref 26.0–34.0)
MCV: 85 fL (ref 80–100)
Neutrophil #: 0.8 x10 3/mm — ABNORMAL LOW (ref 1.4–6.5)
Neutrophil %: 21.2 %
RBC: 4.61 10*6/uL (ref 3.80–5.20)
RDW: 15.3 % — ABNORMAL HIGH (ref 11.5–14.5)
WBC: 3.6 x10 3/mm (ref 3.6–11.0)

## 2012-04-16 LAB — CBC CANCER CENTER
Basophil #: 0.1 x10 3/mm (ref 0.0–0.1)
Eosinophil #: 0 x10 3/mm (ref 0.0–0.7)
Eosinophil %: 0.8 %
Lymphocyte #: 2.5 x10 3/mm (ref 1.0–3.6)
Lymphocyte %: 64.4 %
MCV: 85 fL (ref 80–100)
Monocyte #: 0.4 x10 3/mm (ref 0.2–0.9)
Monocyte %: 11.1 %
Neutrophil %: 22.3 %
Platelet: 237 x10 3/mm (ref 150–440)

## 2012-04-20 LAB — CBC CANCER CENTER
HCT: 39.9 % (ref 35.0–47.0)
HGB: 12.9 g/dL (ref 12.0–16.0)
Lymphocyte #: 2.2 x10 3/mm (ref 1.0–3.6)
Lymphocyte %: 58.5 %
MCH: 27.3 pg (ref 26.0–34.0)
MCHC: 32.3 g/dL (ref 32.0–36.0)
MCV: 85 fL (ref 80–100)
Monocyte #: 0.4 x10 3/mm (ref 0.2–0.9)
Neutrophil #: 1 x10 3/mm — ABNORMAL LOW (ref 1.4–6.5)
Neutrophil %: 25.6 %
Platelet: 220 x10 3/mm (ref 150–440)
RBC: 4.71 10*6/uL (ref 3.80–5.20)
RDW: 15.3 % — ABNORMAL HIGH (ref 11.5–14.5)

## 2012-04-23 ENCOUNTER — Ambulatory Visit: Payer: Self-pay | Admitting: Family Medicine

## 2012-04-27 ENCOUNTER — Ambulatory Visit: Payer: Self-pay | Admitting: Internal Medicine

## 2012-04-29 LAB — CBC CANCER CENTER
Basophil #: 0 x10 3/mm (ref 0.0–0.1)
Basophil %: 0.3 %
Eosinophil %: 5.9 %
Lymphocyte %: 34.7 %
MCH: 27 pg (ref 26.0–34.0)
MCHC: 32 g/dL (ref 32.0–36.0)
Neutrophil #: 2.4 x10 3/mm (ref 1.4–6.5)
Neutrophil %: 50 %
Platelet: 266 x10 3/mm (ref 150–440)
RBC: 4.78 10*6/uL (ref 3.80–5.20)
RDW: 14.9 % — ABNORMAL HIGH (ref 11.5–14.5)

## 2012-05-04 ENCOUNTER — Ambulatory Visit: Payer: Self-pay | Admitting: Family Medicine

## 2012-05-05 LAB — CBC CANCER CENTER
Basophil #: 0.1 x10 3/mm (ref 0.0–0.1)
Basophil %: 1.2 %
Eosinophil #: 0.4 x10 3/mm (ref 0.0–0.7)
HCT: 41.4 % (ref 35.0–47.0)
HGB: 13 g/dL (ref 12.0–16.0)
Lymphocyte %: 22.6 %
MCHC: 31.4 g/dL — ABNORMAL LOW (ref 32.0–36.0)
Monocyte %: 6.7 %
Neutrophil #: 4.2 x10 3/mm (ref 1.4–6.5)
Platelet: 301 x10 3/mm (ref 150–440)
RDW: 15.2 % — ABNORMAL HIGH (ref 11.5–14.5)
WBC: 6.6 x10 3/mm (ref 3.6–11.0)

## 2012-05-11 LAB — CBC CANCER CENTER
Basophil #: 0.1 x10 3/mm (ref 0.0–0.1)
Eosinophil #: 0.4 x10 3/mm (ref 0.0–0.7)
Eosinophil %: 8.1 %
Lymphocyte %: 24.6 %
MCHC: 31.9 g/dL — ABNORMAL LOW (ref 32.0–36.0)
Monocyte %: 7.2 %
Neutrophil %: 58.5 %
Platelet: 265 x10 3/mm (ref 150–440)
RBC: 4.94 10*6/uL (ref 3.80–5.20)
RDW: 14.9 % — ABNORMAL HIGH (ref 11.5–14.5)

## 2012-05-25 LAB — CBC CANCER CENTER
Basophil %: 1.8 %
Eosinophil #: 0.6 x10 3/mm (ref 0.0–0.7)
Eosinophil %: 10.3 %
HCT: 38.5 % (ref 35.0–47.0)
HGB: 12.9 g/dL (ref 12.0–16.0)
Lymphocyte #: 2.1 x10 3/mm (ref 1.0–3.6)
Lymphocyte %: 35.4 %
MCHC: 33.4 g/dL (ref 32.0–36.0)
Monocyte #: 0.5 x10 3/mm (ref 0.2–0.9)
Neutrophil #: 2.6 x10 3/mm (ref 1.4–6.5)
Platelet: 316 x10 3/mm (ref 150–440)
RBC: 4.85 10*6/uL (ref 3.80–5.20)

## 2012-05-28 ENCOUNTER — Ambulatory Visit: Payer: Self-pay | Admitting: Internal Medicine

## 2012-06-17 LAB — CBC CANCER CENTER
Basophil #: 0.1 x10 3/mm (ref 0.0–0.1)
Basophil %: 0.9 %
Eosinophil #: 0.7 x10 3/mm (ref 0.0–0.7)
Eosinophil %: 11.9 %
HCT: 40.7 % (ref 35.0–47.0)
Lymphocyte #: 1.6 x10 3/mm (ref 1.0–3.6)
Lymphocyte %: 27 %
MCH: 24.7 pg — ABNORMAL LOW (ref 26.0–34.0)
MCV: 78 fL — ABNORMAL LOW (ref 80–100)
Monocyte #: 0.4 x10 3/mm (ref 0.2–0.9)
Monocyte %: 7.2 %
Neutrophil #: 3.1 x10 3/mm (ref 1.4–6.5)
RDW: 16.1 % — ABNORMAL HIGH (ref 11.5–14.5)
WBC: 5.9 x10 3/mm (ref 3.6–11.0)

## 2012-06-28 ENCOUNTER — Ambulatory Visit: Payer: Self-pay | Admitting: Internal Medicine

## 2012-07-01 LAB — CBC CANCER CENTER
Basophil %: 1.4 %
Eosinophil #: 0.6 x10 3/mm (ref 0.0–0.7)
Eosinophil %: 9.4 %
HGB: 13.4 g/dL (ref 12.0–16.0)
Lymphocyte #: 1.6 x10 3/mm (ref 1.0–3.6)
Lymphocyte %: 26.5 %
MCH: 24.7 pg — ABNORMAL LOW (ref 26.0–34.0)
MCHC: 32.4 g/dL (ref 32.0–36.0)
MCV: 76 fL — ABNORMAL LOW (ref 80–100)
Monocyte #: 0.4 x10 3/mm (ref 0.2–0.9)
Monocyte %: 7.3 %
Neutrophil #: 3.4 x10 3/mm (ref 1.4–6.5)
Neutrophil %: 55.4 %
Platelet: 327 x10 3/mm (ref 150–440)
RBC: 5.44 10*6/uL — ABNORMAL HIGH (ref 3.80–5.20)
WBC: 6.2 x10 3/mm (ref 3.6–11.0)

## 2012-07-02 DIAGNOSIS — D6800 Von Willebrand disease, unspecified: Secondary | ICD-10-CM | POA: Insufficient documentation

## 2012-07-02 DIAGNOSIS — R350 Frequency of micturition: Secondary | ICD-10-CM | POA: Insufficient documentation

## 2012-07-02 DIAGNOSIS — N3946 Mixed incontinence: Secondary | ICD-10-CM | POA: Insufficient documentation

## 2012-07-02 DIAGNOSIS — R339 Retention of urine, unspecified: Secondary | ICD-10-CM | POA: Insufficient documentation

## 2012-07-02 DIAGNOSIS — N3010 Interstitial cystitis (chronic) without hematuria: Secondary | ICD-10-CM | POA: Insufficient documentation

## 2012-07-02 DIAGNOSIS — R310 Gross hematuria: Secondary | ICD-10-CM | POA: Insufficient documentation

## 2012-07-02 DIAGNOSIS — IMO0002 Reserved for concepts with insufficient information to code with codable children: Secondary | ICD-10-CM | POA: Insufficient documentation

## 2012-07-15 LAB — CBC CANCER CENTER
Eosinophil #: 0.7 x10 3/mm (ref 0.0–0.7)
Eosinophil %: 7.6 %
HCT: 42.8 % (ref 35.0–47.0)
Lymphocyte #: 2.8 x10 3/mm (ref 1.0–3.6)
Lymphocyte %: 30.4 %
MCHC: 31.4 g/dL — ABNORMAL LOW (ref 32.0–36.0)
MCV: 77 fL — ABNORMAL LOW (ref 80–100)
Monocyte %: 6.9 %
Neutrophil #: 5 x10 3/mm (ref 1.4–6.5)
Platelet: 433 x10 3/mm (ref 150–440)
RDW: 16.6 % — ABNORMAL HIGH (ref 11.5–14.5)
WBC: 9.2 x10 3/mm (ref 3.6–11.0)

## 2012-07-22 LAB — CBC CANCER CENTER
Eosinophil #: 0.5 x10 3/mm (ref 0.0–0.7)
Eosinophil %: 9 %
HCT: 38.1 % (ref 35.0–47.0)
HGB: 11.8 g/dL — ABNORMAL LOW (ref 12.0–16.0)
Lymphocyte #: 1.5 x10 3/mm (ref 1.0–3.6)
Lymphocyte %: 27.2 %
MCV: 76 fL — ABNORMAL LOW (ref 80–100)
Monocyte #: 0.4 x10 3/mm (ref 0.2–0.9)
Monocyte %: 7.9 %
Neutrophil #: 3 x10 3/mm (ref 1.4–6.5)
RBC: 5 10*6/uL (ref 3.80–5.20)
RDW: 16.6 % — ABNORMAL HIGH (ref 11.5–14.5)
WBC: 5.6 x10 3/mm (ref 3.6–11.0)

## 2012-07-28 ENCOUNTER — Ambulatory Visit: Payer: Self-pay | Admitting: Internal Medicine

## 2012-07-31 LAB — CBC CANCER CENTER
Basophil %: 2.6 %
Eosinophil #: 0.4 x10 3/mm (ref 0.0–0.7)
Eosinophil %: 6.6 %
HCT: 38.6 % (ref 35.0–47.0)
HGB: 12 g/dL (ref 12.0–16.0)
Lymphocyte %: 28.4 %
MCHC: 31 g/dL — ABNORMAL LOW (ref 32.0–36.0)
MCV: 76 fL — ABNORMAL LOW (ref 80–100)
Monocyte %: 7.3 %
Neutrophil %: 55.1 %
RBC: 5.1 10*6/uL (ref 3.80–5.20)
RDW: 16.4 % — ABNORMAL HIGH (ref 11.5–14.5)
WBC: 6.4 x10 3/mm (ref 3.6–11.0)

## 2012-08-10 LAB — CBC CANCER CENTER
Basophil #: 0.1 x10 3/mm (ref 0.0–0.1)
Basophil %: 1.4 %
Eosinophil %: 5.1 %
HGB: 12 g/dL (ref 12.0–16.0)
Lymphocyte #: 2.1 x10 3/mm (ref 1.0–3.6)
MCH: 22.9 pg — ABNORMAL LOW (ref 26.0–34.0)
MCHC: 30.8 g/dL — ABNORMAL LOW (ref 32.0–36.0)
MCV: 74 fL — ABNORMAL LOW (ref 80–100)
Monocyte #: 0.4 x10 3/mm (ref 0.2–0.9)
Neutrophil #: 4 x10 3/mm (ref 1.4–6.5)
WBC: 7 x10 3/mm (ref 3.6–11.0)

## 2012-08-26 LAB — CBC CANCER CENTER
Basophil %: 3.9 %
Eosinophil #: 0.4 x10 3/mm (ref 0.0–0.7)
Eosinophil %: 5.4 %
HCT: 39.3 % (ref 35.0–47.0)
HGB: 12.2 g/dL (ref 12.0–16.0)
Lymphocyte #: 1.8 x10 3/mm (ref 1.0–3.6)
Lymphocyte %: 25.9 %
MCH: 22.7 pg — ABNORMAL LOW (ref 26.0–34.0)
Monocyte #: 0.5 x10 3/mm (ref 0.2–0.9)
Monocyte %: 6.9 %
Neutrophil #: 4.1 x10 3/mm (ref 1.4–6.5)
Neutrophil %: 57.9 %
RBC: 5.38 10*6/uL — ABNORMAL HIGH (ref 3.80–5.20)

## 2012-08-28 ENCOUNTER — Ambulatory Visit: Payer: Self-pay | Admitting: Internal Medicine

## 2012-09-16 LAB — CBC CANCER CENTER
Basophil #: 0 x10 3/mm (ref 0.0–0.1)
Basophil %: 0.2 %
Eosinophil #: 0.5 x10 3/mm (ref 0.0–0.7)
HGB: 12.9 g/dL (ref 12.0–16.0)
Lymphocyte %: 35.8 %
MCH: 22.2 pg — ABNORMAL LOW (ref 26.0–34.0)
MCHC: 30.9 g/dL — ABNORMAL LOW (ref 32.0–36.0)
Monocyte #: 0.5 x10 3/mm (ref 0.2–0.9)
Monocyte %: 7 %
Neutrophil %: 49.8 %
RDW: 17 % — ABNORMAL HIGH (ref 11.5–14.5)
WBC: 6.8 x10 3/mm (ref 3.6–11.0)

## 2012-09-18 LAB — PROTIME-INR
INR: 1
Prothrombin Time: 13.3 secs (ref 11.5–14.7)

## 2012-09-18 LAB — APTT: Activated PTT: 33.2 secs (ref 23.6–35.9)

## 2012-09-27 ENCOUNTER — Ambulatory Visit: Payer: Self-pay | Admitting: Internal Medicine

## 2012-09-28 ENCOUNTER — Ambulatory Visit: Payer: Self-pay | Admitting: Internal Medicine

## 2012-10-01 LAB — CBC CANCER CENTER
Basophil #: 0.1 x10 3/mm (ref 0.0–0.1)
Basophil %: 1.1 %
Eosinophil #: 0.6 x10 3/mm (ref 0.0–0.7)
Eosinophil %: 6.7 %
HGB: 12.6 g/dL (ref 12.0–16.0)
Lymphocyte %: 24 %
MCHC: 32.5 g/dL (ref 32.0–36.0)
Monocyte #: 0.4 x10 3/mm (ref 0.2–0.9)
Neutrophil #: 5.6 x10 3/mm (ref 1.4–6.5)
Neutrophil %: 63.4 %
RBC: 5.54 10*6/uL — ABNORMAL HIGH (ref 3.80–5.20)

## 2012-10-19 LAB — CBC CANCER CENTER
Basophil #: 0.1 x10 3/mm (ref 0.0–0.1)
HCT: 37.8 % (ref 35.0–47.0)
HGB: 12.1 g/dL (ref 12.0–16.0)
Lymphocyte #: 2.4 x10 3/mm (ref 1.0–3.6)
MCH: 22.1 pg — ABNORMAL LOW (ref 26.0–34.0)
MCHC: 32 g/dL (ref 32.0–36.0)
MCV: 69 fL — ABNORMAL LOW (ref 80–100)
Monocyte #: 0.5 x10 3/mm (ref 0.2–0.9)
Monocyte %: 6.3 %
Neutrophil #: 4.1 x10 3/mm (ref 1.4–6.5)
Platelet: 402 x10 3/mm (ref 150–440)
RBC: 5.47 10*6/uL — ABNORMAL HIGH (ref 3.80–5.20)
RDW: 17 % — ABNORMAL HIGH (ref 11.5–14.5)

## 2012-10-28 ENCOUNTER — Ambulatory Visit: Payer: Self-pay | Admitting: Internal Medicine

## 2012-11-16 LAB — CBC CANCER CENTER
Basophil #: 0.1 x10 3/mm (ref 0.0–0.1)
Basophil %: 1.4 %
Eosinophil %: 1.3 %
HCT: 41.5 % (ref 35.0–47.0)
Lymphocyte #: 2.5 x10 3/mm (ref 1.0–3.6)
Lymphocyte %: 23.1 %
MCHC: 32.1 g/dL (ref 32.0–36.0)
Monocyte #: 0.4 x10 3/mm (ref 0.2–0.9)
Neutrophil %: 70.2 %
Platelet: 413 x10 3/mm (ref 150–440)
RBC: 6.17 10*6/uL — ABNORMAL HIGH (ref 3.80–5.20)
RDW: 18.1 % — ABNORMAL HIGH (ref 11.5–14.5)
WBC: 10.9 x10 3/mm (ref 3.6–11.0)

## 2012-11-28 ENCOUNTER — Ambulatory Visit: Payer: Self-pay | Admitting: Internal Medicine

## 2012-12-02 LAB — CBC CANCER CENTER
Basophil #: 0.2 x10 3/mm — ABNORMAL HIGH (ref 0.0–0.1)
Eosinophil #: 0.3 x10 3/mm (ref 0.0–0.7)
Eosinophil %: 3.4 %
HGB: 12.1 g/dL (ref 12.0–16.0)
MCH: 21.1 pg — ABNORMAL LOW (ref 26.0–34.0)
MCV: 67 fL — ABNORMAL LOW (ref 80–100)
Monocyte %: 6.9 %
Neutrophil #: 5.1 x10 3/mm (ref 1.4–6.5)
RDW: 18.5 % — ABNORMAL HIGH (ref 11.5–14.5)
WBC: 9.8 x10 3/mm (ref 3.6–11.0)

## 2012-12-26 ENCOUNTER — Ambulatory Visit: Payer: Self-pay | Admitting: Internal Medicine

## 2013-01-07 LAB — CBC CANCER CENTER
Basophil #: 0 x10 3/mm (ref 0.0–0.1)
Basophil %: 0.1 %
Eosinophil #: 0.6 x10 3/mm (ref 0.0–0.7)
Eosinophil %: 7.5 %
HCT: 39.3 % (ref 35.0–47.0)
HGB: 12.6 g/dL (ref 12.0–16.0)
Lymphocyte %: 27.2 %
MCH: 21.3 pg — ABNORMAL LOW (ref 26.0–34.0)
MCHC: 32.1 g/dL (ref 32.0–36.0)
MCV: 66 fL — ABNORMAL LOW (ref 80–100)
Monocyte #: 0.5 x10 3/mm (ref 0.2–0.9)
Monocyte %: 6.4 %
Neutrophil #: 4.9 x10 3/mm (ref 1.4–6.5)
RBC: 5.92 10*6/uL — ABNORMAL HIGH (ref 3.80–5.20)
WBC: 8.4 x10 3/mm (ref 3.6–11.0)

## 2013-01-20 DIAGNOSIS — N3941 Urge incontinence: Secondary | ICD-10-CM | POA: Insufficient documentation

## 2013-01-20 DIAGNOSIS — N319 Neuromuscular dysfunction of bladder, unspecified: Secondary | ICD-10-CM | POA: Insufficient documentation

## 2013-01-20 DIAGNOSIS — N393 Stress incontinence (female) (male): Secondary | ICD-10-CM | POA: Insufficient documentation

## 2013-01-26 ENCOUNTER — Ambulatory Visit: Payer: Self-pay | Admitting: Internal Medicine

## 2013-02-01 LAB — CBC CANCER CENTER
Basophil #: 0.2 x10 3/mm — ABNORMAL HIGH (ref 0.0–0.1)
Basophil %: 2.6 %
Eosinophil %: 9.6 %
HGB: 12.9 g/dL (ref 12.0–16.0)
Lymphocyte #: 2 x10 3/mm (ref 1.0–3.6)
Lymphocyte %: 24.9 %
MCH: 21 pg — ABNORMAL LOW (ref 26.0–34.0)
MCHC: 31.8 g/dL — ABNORMAL LOW (ref 32.0–36.0)
Monocyte #: 0.5 x10 3/mm (ref 0.2–0.9)
Neutrophil #: 4.6 x10 3/mm (ref 1.4–6.5)
Neutrophil %: 57.1 %
RBC: 6.15 10*6/uL — ABNORMAL HIGH (ref 3.80–5.20)
RDW: 18.6 % — ABNORMAL HIGH (ref 11.5–14.5)

## 2013-02-18 DIAGNOSIS — N312 Flaccid neuropathic bladder, not elsewhere classified: Secondary | ICD-10-CM | POA: Insufficient documentation

## 2013-02-18 DIAGNOSIS — N3942 Incontinence without sensory awareness: Secondary | ICD-10-CM | POA: Insufficient documentation

## 2013-02-25 ENCOUNTER — Ambulatory Visit: Payer: Self-pay | Admitting: Internal Medicine

## 2013-03-19 LAB — CBC CANCER CENTER
Basophil #: 0.1 x10 3/mm (ref 0.0–0.1)
Eosinophil %: 6.5 %
HGB: 13.1 g/dL (ref 12.0–16.0)
Monocyte #: 0.3 x10 3/mm (ref 0.2–0.9)
Monocyte %: 4.1 %
Neutrophil #: 5.4 x10 3/mm (ref 1.4–6.5)
Neutrophil %: 67.3 %
Platelet: 444 x10 3/mm — ABNORMAL HIGH (ref 150–440)
RBC: 6.25 10*6/uL — ABNORMAL HIGH (ref 3.80–5.20)
RDW: 17.4 % — ABNORMAL HIGH (ref 11.5–14.5)
WBC: 8 x10 3/mm (ref 3.6–11.0)

## 2013-03-25 DIAGNOSIS — N3020 Other chronic cystitis without hematuria: Secondary | ICD-10-CM | POA: Insufficient documentation

## 2013-03-28 ENCOUNTER — Ambulatory Visit: Payer: Self-pay | Admitting: Internal Medicine

## 2013-04-06 ENCOUNTER — Other Ambulatory Visit: Payer: Self-pay

## 2013-04-06 LAB — LIPID PANEL
Cholesterol: 119 mg/dL (ref 0–200)
HDL Cholesterol: 32 mg/dL — ABNORMAL LOW (ref 40–60)
VLDL Cholesterol, Calc: 53 mg/dL — ABNORMAL HIGH (ref 5–40)

## 2013-04-06 LAB — FOLATE: Folic Acid: 4.8 ng/mL (ref 3.1–100.0)

## 2013-04-06 LAB — TSH: Thyroid Stimulating Horm: 4.58 u[IU]/mL — ABNORMAL HIGH

## 2013-04-21 LAB — CBC CANCER CENTER
Basophil #: 0.1 x10 3/mm (ref 0.0–0.1)
Eosinophil %: 4.6 %
HGB: 13.2 g/dL (ref 12.0–16.0)
Lymphocyte #: 2.2 x10 3/mm (ref 1.0–3.6)
Lymphocyte %: 25.5 %
MCH: 21.4 pg — ABNORMAL LOW (ref 26.0–34.0)
MCHC: 32.4 g/dL (ref 32.0–36.0)
MCV: 66 fL — ABNORMAL LOW (ref 80–100)
Monocyte #: 0.4 x10 3/mm (ref 0.2–0.9)
Monocyte %: 4.6 %
Neutrophil #: 5.4 x10 3/mm (ref 1.4–6.5)
Platelet: 484 x10 3/mm — ABNORMAL HIGH (ref 150–440)
RBC: 6.15 10*6/uL — ABNORMAL HIGH (ref 3.80–5.20)
WBC: 8.4 x10 3/mm (ref 3.6–11.0)

## 2013-04-27 ENCOUNTER — Ambulatory Visit: Payer: Self-pay | Admitting: Internal Medicine

## 2013-05-19 LAB — CBC CANCER CENTER
Basophil #: 0 x10 3/mm (ref 0.0–0.1)
Eosinophil #: 0.2 x10 3/mm (ref 0.0–0.7)
Eosinophil %: 2.5 %
HCT: 43.4 % (ref 35.0–47.0)
HGB: 14.1 g/dL (ref 12.0–16.0)
Lymphocyte #: 2 x10 3/mm (ref 1.0–3.6)
Lymphocyte %: 20.8 %
MCHC: 32.6 g/dL (ref 32.0–36.0)
MCV: 69 fL — ABNORMAL LOW (ref 80–100)
Monocyte #: 0.5 x10 3/mm (ref 0.2–0.9)
Monocyte %: 4.9 %
Neutrophil #: 7 x10 3/mm — ABNORMAL HIGH (ref 1.4–6.5)
Platelet: 482 x10 3/mm — ABNORMAL HIGH (ref 150–440)
RDW: 22.9 % — ABNORMAL HIGH (ref 11.5–14.5)

## 2013-05-28 ENCOUNTER — Ambulatory Visit: Payer: Self-pay | Admitting: Internal Medicine

## 2013-06-09 LAB — CBC CANCER CENTER
Basophil #: 0 x10 3/mm (ref 0.0–0.1)
Eosinophil #: 0.3 x10 3/mm (ref 0.0–0.7)
Eosinophil %: 3 %
HCT: 42.6 % (ref 35.0–47.0)
HGB: 13.9 g/dL (ref 12.0–16.0)
Lymphocyte %: 21.8 %
MCHC: 32.6 g/dL (ref 32.0–36.0)
MCV: 68 fL — ABNORMAL LOW (ref 80–100)
Monocyte #: 0.6 x10 3/mm (ref 0.2–0.9)
Monocyte %: 6 %
Neutrophil #: 6.7 x10 3/mm — ABNORMAL HIGH (ref 1.4–6.5)
Neutrophil %: 69 %
Platelet: 457 x10 3/mm — ABNORMAL HIGH (ref 150–440)
RBC: 6.24 10*6/uL — ABNORMAL HIGH (ref 3.80–5.20)
RDW: 21 % — ABNORMAL HIGH (ref 11.5–14.5)

## 2013-06-24 ENCOUNTER — Emergency Department: Payer: Self-pay | Admitting: Emergency Medicine

## 2013-06-25 DIAGNOSIS — E1142 Type 2 diabetes mellitus with diabetic polyneuropathy: Secondary | ICD-10-CM | POA: Insufficient documentation

## 2013-06-28 ENCOUNTER — Ambulatory Visit: Payer: Self-pay | Admitting: Internal Medicine

## 2013-07-07 LAB — CBC CANCER CENTER
Basophil #: 0.2 x10 3/mm — ABNORMAL HIGH (ref 0.0–0.1)
Basophil %: 1.1 %
Eosinophil #: 0.2 x10 3/mm (ref 0.0–0.7)
HCT: 38.9 % (ref 35.0–47.0)
HGB: 12.1 g/dL (ref 12.0–16.0)
Lymphocyte #: 3.7 x10 3/mm — ABNORMAL HIGH (ref 1.0–3.6)
Lymphocyte %: 23.5 %
MCH: 21.2 pg — ABNORMAL LOW (ref 26.0–34.0)
MCV: 68 fL — ABNORMAL LOW (ref 80–100)
Monocyte %: 7.3 %
Neutrophil #: 10.7 x10 3/mm — ABNORMAL HIGH (ref 1.4–6.5)
Platelet: 490 x10 3/mm — ABNORMAL HIGH (ref 150–440)
RBC: 5.7 10*6/uL — ABNORMAL HIGH (ref 3.80–5.20)

## 2013-07-12 ENCOUNTER — Other Ambulatory Visit: Payer: Self-pay | Admitting: Unknown Physician Specialty

## 2013-07-12 LAB — CLOSTRIDIUM DIFFICILE BY PCR

## 2013-07-14 LAB — STOOL CULTURE

## 2013-07-28 ENCOUNTER — Ambulatory Visit: Payer: Self-pay | Admitting: Internal Medicine

## 2013-08-04 ENCOUNTER — Emergency Department: Payer: Self-pay | Admitting: Internal Medicine

## 2013-08-12 LAB — CBC CANCER CENTER
Basophil #: 0 x10 3/mm (ref 0.0–0.1)
Basophil %: 0.3 %
Eosinophil #: 0.2 x10 3/mm (ref 0.0–0.7)
HCT: 41 % (ref 35.0–47.0)
HGB: 13.3 g/dL (ref 12.0–16.0)
MCHC: 32.5 g/dL (ref 32.0–36.0)
Monocyte #: 0.6 x10 3/mm (ref 0.2–0.9)
Monocyte %: 6.5 %
Neutrophil #: 6.9 x10 3/mm — ABNORMAL HIGH (ref 1.4–6.5)
Neutrophil %: 69.9 %
Platelet: 443 x10 3/mm — ABNORMAL HIGH (ref 150–440)
RDW: 21.8 % — ABNORMAL HIGH (ref 11.5–14.5)

## 2013-08-28 ENCOUNTER — Ambulatory Visit: Payer: Self-pay | Admitting: Internal Medicine

## 2013-09-02 LAB — CBC CANCER CENTER
Eosinophil %: 0.2 %
HGB: 13.5 g/dL (ref 12.0–16.0)
Lymphocyte #: 1.2 x10 3/mm (ref 1.0–3.6)
Lymphocyte %: 6.5 %
MCH: 22.5 pg — ABNORMAL LOW (ref 26.0–34.0)
MCHC: 31.4 g/dL — ABNORMAL LOW (ref 32.0–36.0)
MCV: 72 fL — ABNORMAL LOW (ref 80–100)
Monocyte #: 0.6 x10 3/mm (ref 0.2–0.9)
Neutrophil #: 17.2 x10 3/mm — ABNORMAL HIGH (ref 1.4–6.5)
Neutrophil %: 89.7 %
WBC: 19.2 x10 3/mm — ABNORMAL HIGH (ref 3.6–11.0)

## 2013-09-06 ENCOUNTER — Ambulatory Visit: Payer: Self-pay | Admitting: Unknown Physician Specialty

## 2013-09-10 LAB — CBC CANCER CENTER
Basophil #: 0.2 x10 3/mm — ABNORMAL HIGH (ref 0.0–0.1)
Basophil %: 1.3 %
Eosinophil #: 0.2 x10 3/mm (ref 0.0–0.7)
Eosinophil %: 1.6 %
Lymphocyte #: 2.2 x10 3/mm (ref 1.0–3.6)
Lymphocyte %: 15.3 %
MCH: 22.5 pg — ABNORMAL LOW (ref 26.0–34.0)
MCHC: 32.1 g/dL (ref 32.0–36.0)
MCV: 70 fL — ABNORMAL LOW (ref 80–100)
Monocyte %: 8 %
Neutrophil %: 73.8 %
Platelet: 481 x10 3/mm — ABNORMAL HIGH (ref 150–440)
RBC: 6.14 10*6/uL — ABNORMAL HIGH (ref 3.80–5.20)
WBC: 14.4 x10 3/mm — ABNORMAL HIGH (ref 3.6–11.0)

## 2013-09-15 ENCOUNTER — Ambulatory Visit: Payer: Self-pay | Admitting: Family Medicine

## 2013-09-27 ENCOUNTER — Ambulatory Visit: Payer: Self-pay | Admitting: Internal Medicine

## 2013-09-27 LAB — CBC CANCER CENTER
Eosinophil #: 0.3 x10 3/mm (ref 0.0–0.7)
Lymphocyte %: 19.4 %
MCHC: 32 g/dL (ref 32.0–36.0)
MCV: 70 fL — ABNORMAL LOW (ref 80–100)
Monocyte #: 0.6 x10 3/mm (ref 0.2–0.9)
Monocyte %: 7.5 %
Neutrophil #: 5.9 x10 3/mm (ref 1.4–6.5)
Neutrophil %: 68.8 %
WBC: 8.5 x10 3/mm (ref 3.6–11.0)

## 2013-10-11 LAB — CBC CANCER CENTER
Basophil #: 0.2 x10 3/mm — ABNORMAL HIGH (ref 0.0–0.1)
HGB: 12.8 g/dL (ref 12.0–16.0)
Lymphocyte #: 2.2 x10 3/mm (ref 1.0–3.6)
MCH: 21.8 pg — ABNORMAL LOW (ref 26.0–34.0)
MCHC: 31.7 g/dL — ABNORMAL LOW (ref 32.0–36.0)
MCV: 69 fL — ABNORMAL LOW (ref 80–100)
Monocyte #: 0.8 x10 3/mm (ref 0.2–0.9)
Monocyte %: 6.3 %
Neutrophil #: 8.9 x10 3/mm — ABNORMAL HIGH (ref 1.4–6.5)
Platelet: 460 x10 3/mm — ABNORMAL HIGH (ref 150–440)

## 2013-10-28 ENCOUNTER — Ambulatory Visit: Payer: Self-pay | Admitting: Internal Medicine

## 2013-11-02 LAB — CBC CANCER CENTER
Basophil #: 0.1 x10 3/mm (ref 0.0–0.1)
Basophil %: 1.3 %
EOS PCT: 2.5 %
Eosinophil #: 0.2 x10 3/mm (ref 0.0–0.7)
HCT: 41.8 % (ref 35.0–47.0)
HGB: 13 g/dL (ref 12.0–16.0)
LYMPHS PCT: 19.7 %
Lymphocyte #: 1.8 x10 3/mm (ref 1.0–3.6)
MCH: 21.3 pg — ABNORMAL LOW (ref 26.0–34.0)
MCHC: 31.1 g/dL — AB (ref 32.0–36.0)
MCV: 68 fL — ABNORMAL LOW (ref 80–100)
MONO ABS: 0.5 x10 3/mm (ref 0.2–0.9)
Monocyte %: 5.5 %
NEUTROS ABS: 6.6 x10 3/mm — AB (ref 1.4–6.5)
NEUTROS PCT: 71 %
Platelet: 475 x10 3/mm — ABNORMAL HIGH (ref 150–440)
RBC: 6.11 10*6/uL — ABNORMAL HIGH (ref 3.80–5.20)
RDW: 18.4 % — ABNORMAL HIGH (ref 11.5–14.5)
WBC: 9.3 x10 3/mm (ref 3.6–11.0)

## 2013-11-02 LAB — APTT: Activated PTT: 32 secs (ref 23.6–35.9)

## 2013-11-25 ENCOUNTER — Ambulatory Visit: Payer: Self-pay | Admitting: Internal Medicine

## 2013-11-25 LAB — CBC CANCER CENTER
BASOS ABS: 0.1 x10 3/mm (ref 0.0–0.1)
BASOS PCT: 0.8 %
EOS ABS: 0.2 x10 3/mm (ref 0.0–0.7)
EOS PCT: 2.5 %
HCT: 40.9 % (ref 35.0–47.0)
HGB: 12.8 g/dL (ref 12.0–16.0)
LYMPHS PCT: 20.8 %
Lymphocyte #: 1.5 x10 3/mm (ref 1.0–3.6)
MCH: 21.7 pg — AB (ref 26.0–34.0)
MCHC: 31.2 g/dL — AB (ref 32.0–36.0)
MCV: 70 fL — AB (ref 80–100)
MONO ABS: 0.4 x10 3/mm (ref 0.2–0.9)
Monocyte %: 6 %
NEUTROS PCT: 69.9 %
Neutrophil #: 4.9 x10 3/mm (ref 1.4–6.5)
PLATELETS: 442 x10 3/mm — AB (ref 150–440)
RBC: 5.88 10*6/uL — ABNORMAL HIGH (ref 3.80–5.20)
RDW: 18.4 % — AB (ref 11.5–14.5)
WBC: 7.1 x10 3/mm (ref 3.6–11.0)

## 2013-11-25 LAB — BASIC METABOLIC PANEL
Anion Gap: 11 (ref 7–16)
BUN: 7 mg/dL (ref 7–18)
Calcium, Total: 8.1 mg/dL — ABNORMAL LOW (ref 8.5–10.1)
Chloride: 106 mmol/L (ref 98–107)
Co2: 27 mmol/L (ref 21–32)
Creatinine: 0.95 mg/dL (ref 0.60–1.30)
GLUCOSE: 95 mg/dL (ref 65–99)
Osmolality: 285 (ref 275–301)
Potassium: 3.7 mmol/L (ref 3.5–5.1)
SODIUM: 144 mmol/L (ref 136–145)

## 2013-11-25 LAB — MAGNESIUM: Magnesium: 2 mg/dL

## 2013-11-28 ENCOUNTER — Ambulatory Visit: Payer: Self-pay | Admitting: Internal Medicine

## 2013-12-13 LAB — COMPREHENSIVE METABOLIC PANEL
ALBUMIN: 3.4 g/dL (ref 3.4–5.0)
ALK PHOS: 79 U/L
ANION GAP: 3 — AB (ref 7–16)
BUN: 11 mg/dL (ref 7–18)
Bilirubin,Total: 0.6 mg/dL (ref 0.2–1.0)
CO2: 30 mmol/L (ref 21–32)
Calcium, Total: 8.7 mg/dL (ref 8.5–10.1)
Chloride: 108 mmol/L — ABNORMAL HIGH (ref 98–107)
Creatinine: 0.96 mg/dL (ref 0.60–1.30)
EGFR (African American): >60
EGFR (Non-African Amer.): >60
GLUCOSE: 89 mg/dL (ref 65–99)
Osmolality: 280 (ref 275–301)
Potassium: 4.2 mmol/L (ref 3.5–5.1)
SGOT(AST): 38 U/L — ABNORMAL HIGH (ref 15–37)
SGPT (ALT): 23 U/L (ref 12–78)
SODIUM: 141 mmol/L (ref 136–145)
Total Protein: 6.7 g/dL (ref 6.4–8.2)

## 2013-12-13 LAB — CBC CANCER CENTER
BASOS PCT: 0.4 %
Basophil #: 0 x10 3/mm (ref 0.0–0.1)
EOS ABS: 0.3 x10 3/mm (ref 0.0–0.7)
Eosinophil %: 2.4 %
HCT: 40.4 % (ref 35.0–47.0)
HGB: 12.4 g/dL (ref 12.0–16.0)
Lymphocyte #: 2.3 x10 3/mm (ref 1.0–3.6)
Lymphocyte %: 20.8 %
MCH: 20.6 pg — AB (ref 26.0–34.0)
MCHC: 30.7 g/dL — ABNORMAL LOW (ref 32.0–36.0)
MCV: 67 fL — ABNORMAL LOW (ref 80–100)
MONOS PCT: 6.7 %
Monocyte #: 0.7 x10 3/mm (ref 0.2–0.9)
NEUTROS PCT: 69.7 %
Neutrophil #: 7.7 x10 3/mm — ABNORMAL HIGH (ref 1.4–6.5)
Platelet: 421 x10 3/mm (ref 150–440)
RBC: 6.02 10*6/uL — AB (ref 3.80–5.20)
RDW: 19.6 % — ABNORMAL HIGH (ref 11.5–14.5)
WBC: 11 x10 3/mm (ref 3.6–11.0)

## 2013-12-13 LAB — MAGNESIUM: MAGNESIUM: 2 mg/dL

## 2013-12-16 ENCOUNTER — Ambulatory Visit: Payer: Self-pay | Admitting: Gastroenterology

## 2013-12-26 ENCOUNTER — Ambulatory Visit: Payer: Self-pay | Admitting: Internal Medicine

## 2013-12-31 ENCOUNTER — Observation Stay: Payer: Self-pay | Admitting: Internal Medicine

## 2013-12-31 LAB — CBC
HCT: 37.8 % (ref 35.0–47.0)
HGB: 12.1 g/dL (ref 12.0–16.0)
MCH: 21.6 pg — AB (ref 26.0–34.0)
MCHC: 32.2 g/dL (ref 32.0–36.0)
MCV: 67 fL — ABNORMAL LOW (ref 80–100)
Platelet: 408 10*3/uL (ref 150–440)
RBC: 5.63 10*6/uL — ABNORMAL HIGH (ref 3.80–5.20)
RDW: 20.4 % — ABNORMAL HIGH (ref 11.5–14.5)
WBC: 8.8 10*3/uL (ref 3.6–11.0)

## 2013-12-31 LAB — BASIC METABOLIC PANEL
Anion Gap: 4 — ABNORMAL LOW (ref 7–16)
BUN: 9 mg/dL (ref 7–18)
CHLORIDE: 111 mmol/L — AB (ref 98–107)
CO2: 28 mmol/L (ref 21–32)
Calcium, Total: 8.3 mg/dL — ABNORMAL LOW (ref 8.5–10.1)
Creatinine: 0.81 mg/dL (ref 0.60–1.30)
EGFR (African American): >60
Glucose: 93 mg/dL (ref 65–99)
OSMOLALITY: 283 (ref 275–301)
Potassium: 3.7 mmol/L (ref 3.5–5.1)
Sodium: 143 mmol/L (ref 136–145)

## 2013-12-31 LAB — URINALYSIS, COMPLETE
BLOOD: NEGATIVE
Bilirubin,UR: NEGATIVE
Glucose,UR: NEGATIVE mg/dL (ref 0–75)
KETONE: NEGATIVE
Nitrite: NEGATIVE
PROTEIN: NEGATIVE
Ph: 6 (ref 4.5–8.0)
RBC,UR: 1 /HPF (ref 0–5)
Specific Gravity: 1.003 (ref 1.003–1.030)
WBC UR: 36 /HPF (ref 0–5)

## 2013-12-31 LAB — CBC CANCER CENTER
Basophil #: 0 x10 3/mm (ref 0.0–0.1)
Basophil %: 0.2 %
EOS ABS: 0.3 x10 3/mm (ref 0.0–0.7)
Eosinophil %: 3.8 %
HCT: 39.9 % (ref 35.0–47.0)
HGB: 12.3 g/dL (ref 12.0–16.0)
LYMPHS PCT: 22.3 %
Lymphocyte #: 1.8 x10 3/mm (ref 1.0–3.6)
MCH: 20.5 pg — AB (ref 26.0–34.0)
MCHC: 30.9 g/dL — ABNORMAL LOW (ref 32.0–36.0)
MCV: 67 fL — AB (ref 80–100)
Monocyte #: 0.5 x10 3/mm (ref 0.2–0.9)
Monocyte %: 6.1 %
NEUTROS ABS: 5.6 x10 3/mm (ref 1.4–6.5)
Neutrophil %: 67.6 %
PLATELETS: 455 x10 3/mm — AB (ref 150–440)
RBC: 5.99 10*6/uL — ABNORMAL HIGH (ref 3.80–5.20)
RDW: 20.6 % — ABNORMAL HIGH (ref 11.5–14.5)
WBC: 8.3 x10 3/mm (ref 3.6–11.0)

## 2013-12-31 LAB — CK TOTAL AND CKMB (NOT AT ARMC)
CK, TOTAL: 34 U/L
CK, TOTAL: 32 U/L
CK-MB: 0.6 ng/mL (ref 0.5–3.6)
CK-MB: 0.5 ng/mL (ref 0.5–3.6)

## 2013-12-31 LAB — DIFFERENTIAL
Basophil #: 0.1 10*3/uL (ref 0.0–0.1)
Basophil %: 1 %
EOS ABS: 0.3 10*3/uL (ref 0.0–0.7)
Eosinophil %: 3.8 %
Lymphocyte #: 1.8 10*3/uL (ref 1.0–3.6)
Lymphocyte %: 20.6 %
Monocyte #: 0.6 x10 3/mm (ref 0.2–0.9)
Monocyte %: 7 %
Neutrophil #: 5.9 10*3/uL (ref 1.4–6.5)
Neutrophil %: 67.6 %

## 2013-12-31 LAB — TROPONIN I
Troponin-I: <0.02 ng/mL
Troponin-I: <0.02 ng/mL

## 2014-01-01 LAB — CBC WITH DIFFERENTIAL/PLATELET
Basophil #: 0.1 10*3/uL (ref 0.0–0.1)
Basophil %: 1.2 %
EOS PCT: 3.6 %
Eosinophil #: 0.2 10*3/uL (ref 0.0–0.7)
HCT: 35.2 % (ref 35.0–47.0)
HGB: 10.9 g/dL — ABNORMAL LOW (ref 12.0–16.0)
Lymphocyte #: 1.8 10*3/uL (ref 1.0–3.6)
Lymphocyte %: 25.5 %
MCH: 20.8 pg — AB (ref 26.0–34.0)
MCHC: 31.1 g/dL — ABNORMAL LOW (ref 32.0–36.0)
MCV: 67 fL — AB (ref 80–100)
MONOS PCT: 7 %
Monocyte #: 0.5 x10 3/mm (ref 0.2–0.9)
Neutrophil #: 4.3 10*3/uL (ref 1.4–6.5)
Neutrophil %: 62.7 %
PLATELETS: 410 10*3/uL (ref 150–440)
RBC: 5.27 10*6/uL — ABNORMAL HIGH (ref 3.80–5.20)
RDW: 20.5 % — ABNORMAL HIGH (ref 11.5–14.5)
WBC: 6.9 10*3/uL (ref 3.6–11.0)

## 2014-01-01 LAB — HEMOGLOBIN A1C: Hemoglobin A1C: 5.5 % (ref 4.2–6.3)

## 2014-01-01 LAB — MAGNESIUM: Magnesium: 1.8 mg/dL

## 2014-01-01 LAB — BASIC METABOLIC PANEL
Anion Gap: 6 — ABNORMAL LOW (ref 7–16)
BUN: 8 mg/dL (ref 7–18)
Calcium, Total: 7.9 mg/dL — ABNORMAL LOW (ref 8.5–10.1)
Chloride: 113 mmol/L — ABNORMAL HIGH (ref 98–107)
Co2: 26 mmol/L (ref 21–32)
Creatinine: 0.84 mg/dL (ref 0.60–1.30)
EGFR (African American): >60
Glucose: 88 mg/dL (ref 65–99)
Osmolality: 286 (ref 275–301)
Potassium: 3.6 mmol/L (ref 3.5–5.1)
Sodium: 145 mmol/L (ref 136–145)

## 2014-01-01 LAB — LIPID PANEL
Cholesterol: 109 mg/dL (ref 0–200)
HDL: 34 mg/dL — AB (ref 40–60)
Ldl Cholesterol, Calc: 40 mg/dL (ref 0–100)
Triglycerides: 175 mg/dL (ref 0–200)
VLDL CHOLESTEROL, CALC: 35 mg/dL (ref 5–40)

## 2014-01-01 LAB — TSH: Thyroid Stimulating Horm: 1.33 u[IU]/mL

## 2014-01-03 LAB — URINE CULTURE

## 2014-01-08 IMAGING — US ABDOMEN ULTRASOUND
1 series · 17 of 25 positions shown · non-contrast
Comparison: none

REASON FOR EXAM: complete   diarrhea  LUQ RUQ abd pain
COMMENTS:

[Series 1: abdomen ultrasound · 17 of 79 slices shown]
[im 1/79]
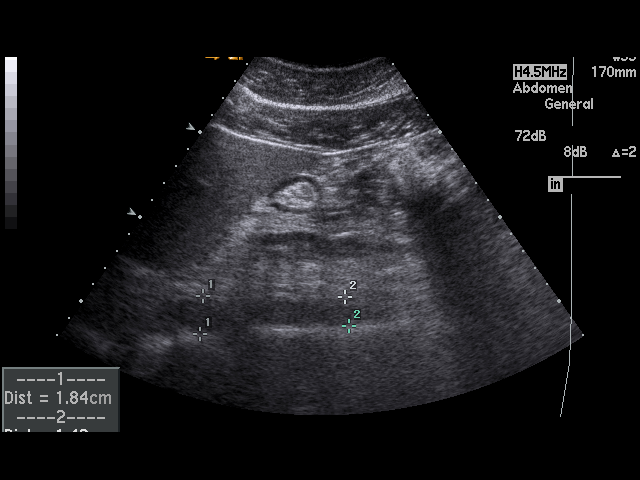
[im 7/79]
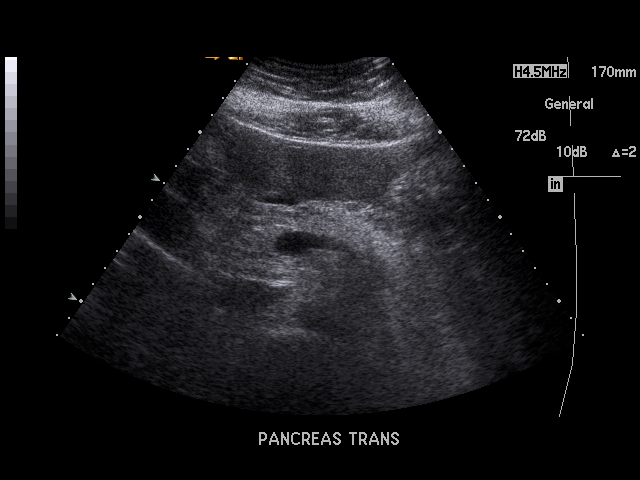
[im 10/79]
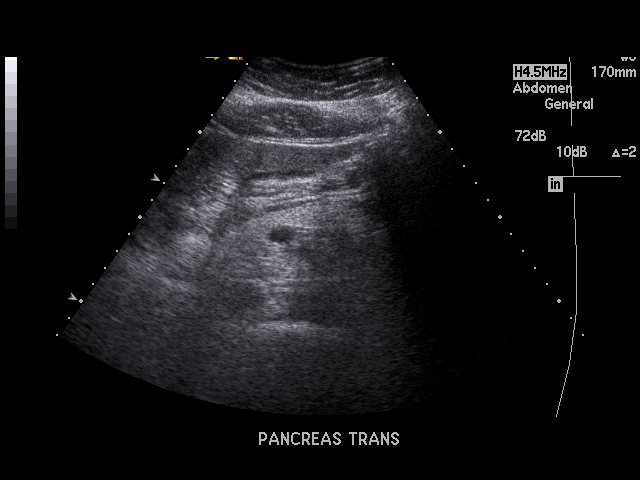
[im 17/79]
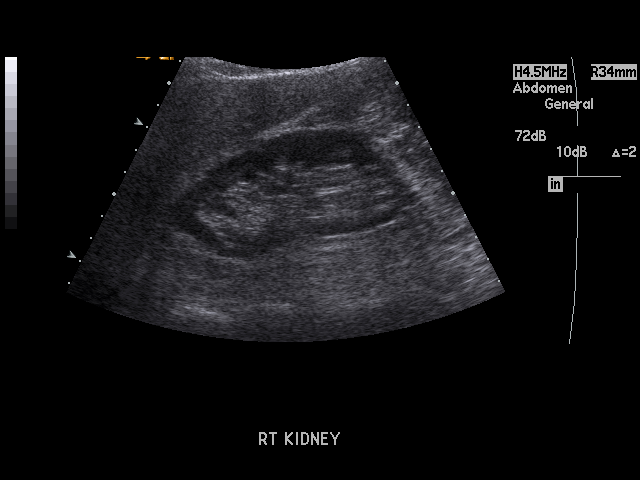
[im 20/79]
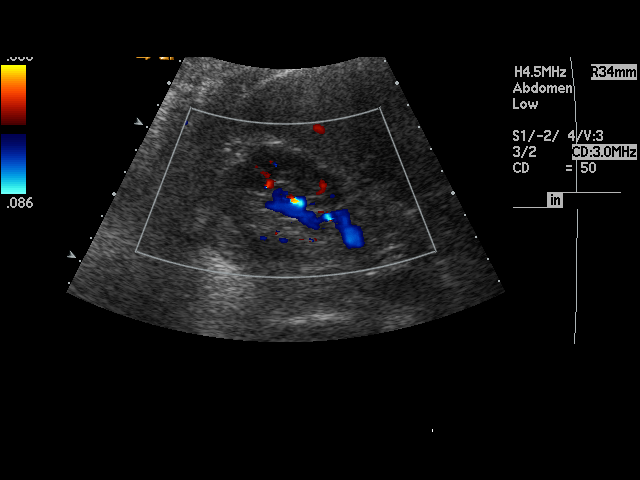
[im 27/79]
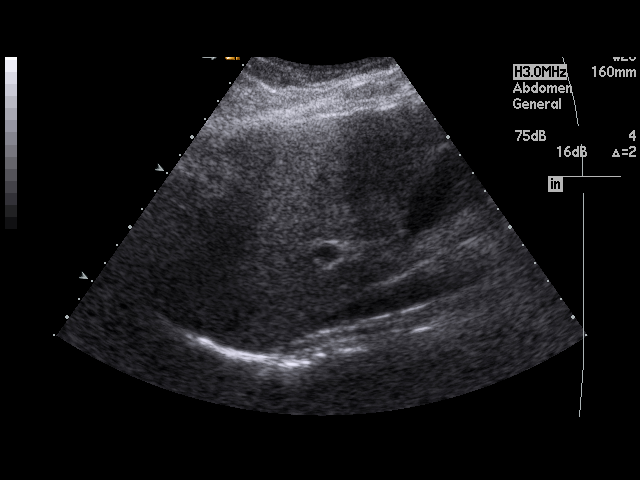
[im 30/79]
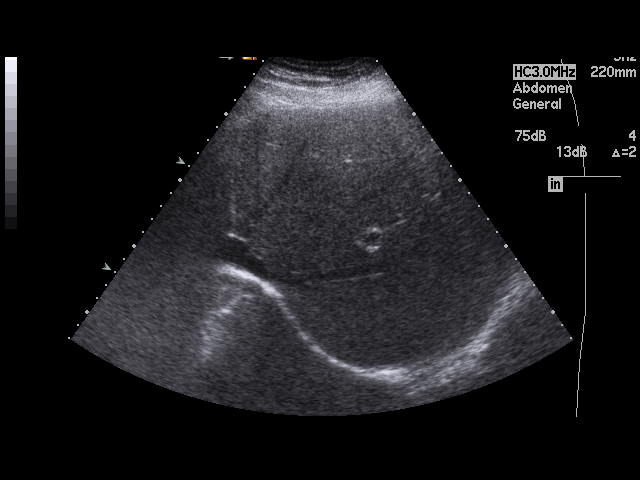
[im 36/79]
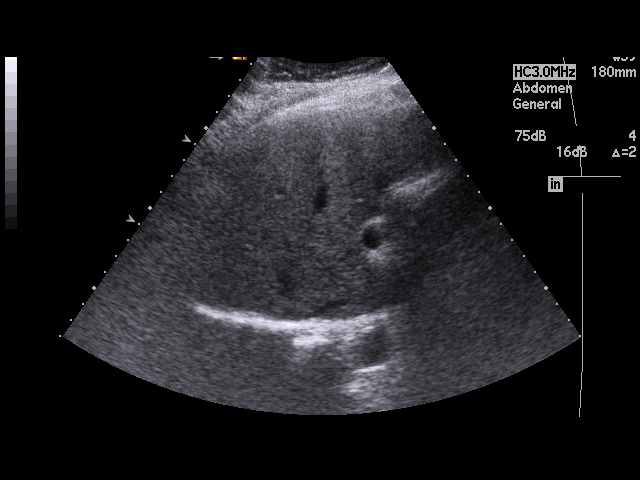
[im 40/79]
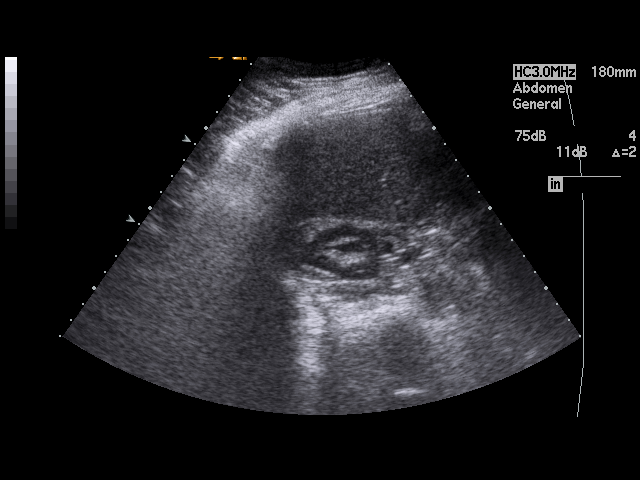
[im 43/79]
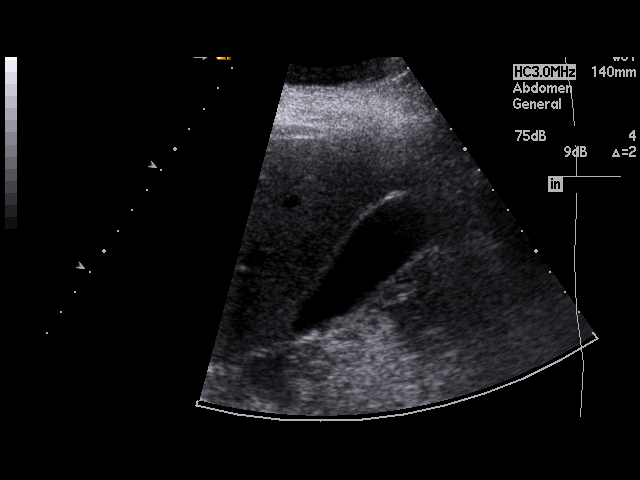
[im 49/79]
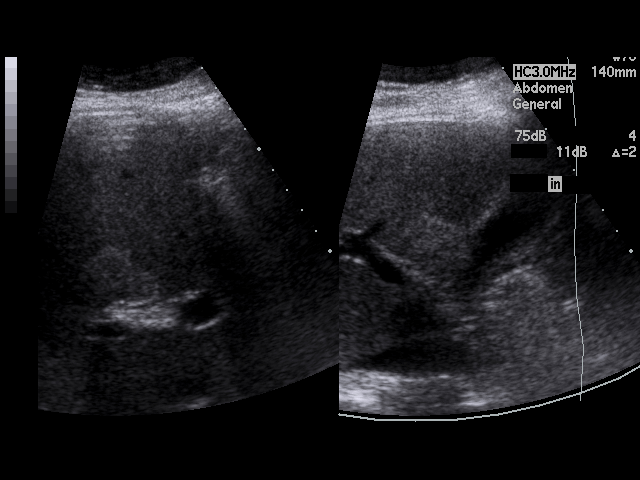
[im 53/79]
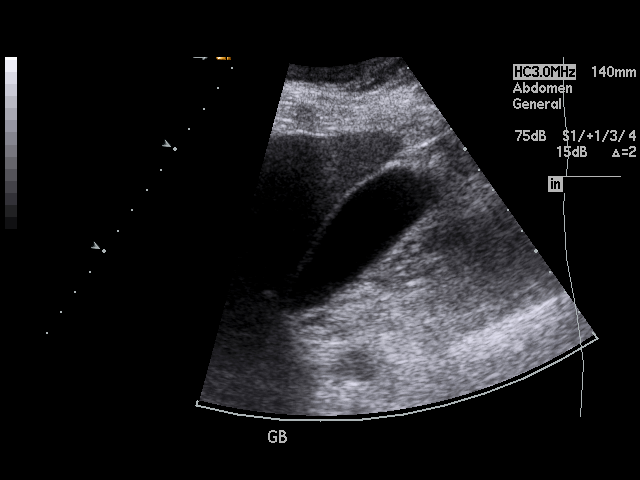
[im 59/79]
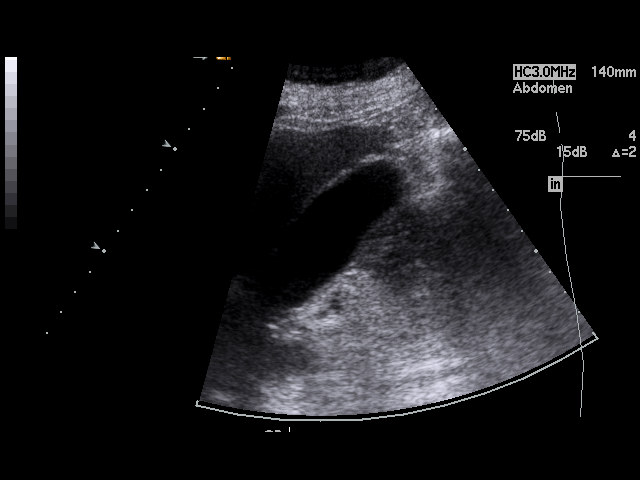
[im 62/79]
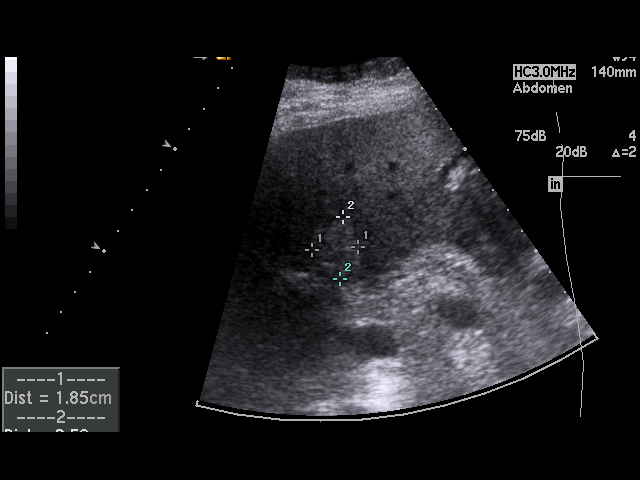
[im 69/79]
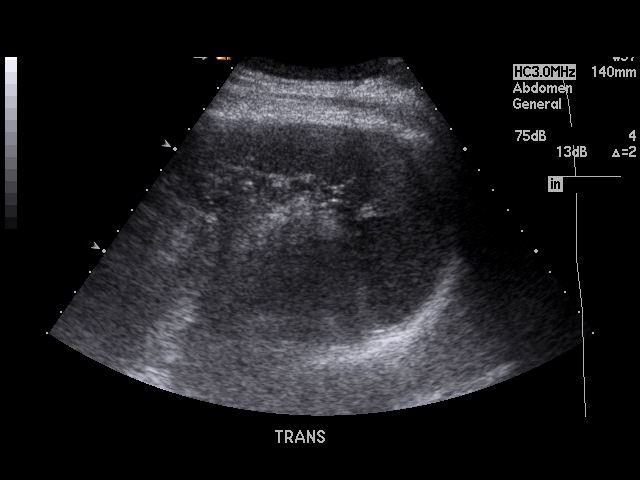
[im 72/79]
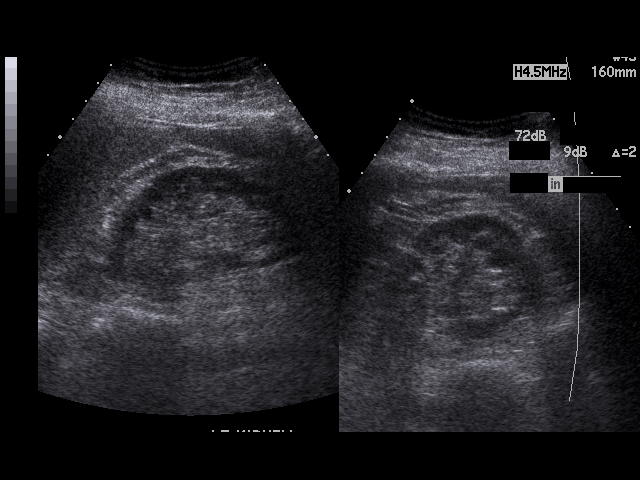
[im 79/79]
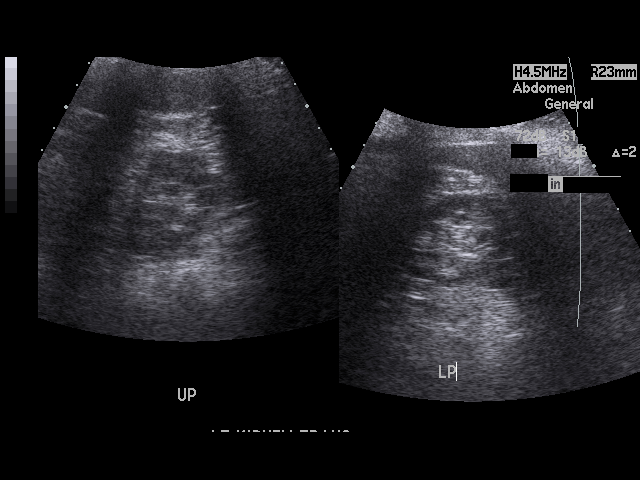

[17 of 25 positions shown; findings below may reference images not displayed]

PROCEDURE:     US  - US ABDOMEN GENERAL SURVEY  - December 06, 2011  [DATE]

RESULT:     There is a 3.1 cm mass located medially in the right lobe. The
mass is mildly hyperechogenic and may represent a hepatic hemangioma.
Additional evaluation of the liver by hepatic MR is suggested. The hepatic
echo pattern otherwise is normal in appearance. Spleen size is normal. The
abdominal aorta and inferior vena cava show no significant abnormalities. No
gallstones are seen. There is no thickening of the gallbladder wall. The
common bile duct measures 4.2 mm in diameter which is within normal limits.
The kidneys show no hydronephrosis. Sagittally, the right kidney measures
10.4 cm and the left measured 9.74 cm. No ascites is seen.
IMPRESSION: 1. No gallstones are identified.
2. There is a focal hyperechoic mass medially in the right lobe of the
liver. Additional evaluation by hepatic MR is suggested.

## 2014-01-21 LAB — CBC CANCER CENTER
Basophil #: 0 x10 3/mm (ref 0.0–0.1)
Basophil %: 0.3 %
EOS PCT: 3.1 %
Eosinophil #: 0.3 x10 3/mm (ref 0.0–0.7)
HCT: 38.5 % (ref 35.0–47.0)
HGB: 11.7 g/dL — ABNORMAL LOW (ref 12.0–16.0)
LYMPHS ABS: 1.9 x10 3/mm (ref 1.0–3.6)
LYMPHS PCT: 20.8 %
MCH: 20.5 pg — AB (ref 26.0–34.0)
MCHC: 30.5 g/dL — ABNORMAL LOW (ref 32.0–36.0)
MCV: 67 fL — ABNORMAL LOW (ref 80–100)
Monocyte #: 0.5 x10 3/mm (ref 0.2–0.9)
Monocyte %: 5.5 %
NEUTROS ABS: 6.3 x10 3/mm (ref 1.4–6.5)
Neutrophil %: 70.3 %
Platelet: 451 x10 3/mm — ABNORMAL HIGH (ref 150–440)
RBC: 5.74 10*6/uL — ABNORMAL HIGH (ref 3.80–5.20)
RDW: 20.5 % — ABNORMAL HIGH (ref 11.5–14.5)
WBC: 9 x10 3/mm (ref 3.6–11.0)

## 2014-01-26 ENCOUNTER — Ambulatory Visit: Payer: Self-pay | Admitting: Internal Medicine

## 2014-02-07 LAB — CBC CANCER CENTER
Basophil #: 0.1 x10 3/mm (ref 0.0–0.1)
Basophil %: 0.8 %
EOS ABS: 0.1 x10 3/mm (ref 0.0–0.7)
EOS PCT: 0.9 %
HCT: 40.3 % (ref 35.0–47.0)
HGB: 12.3 g/dL (ref 12.0–16.0)
Lymphocyte #: 1.9 x10 3/mm (ref 1.0–3.6)
Lymphocyte %: 13.1 %
MCH: 20.3 pg — ABNORMAL LOW (ref 26.0–34.0)
MCHC: 30.5 g/dL — ABNORMAL LOW (ref 32.0–36.0)
MCV: 67 fL — AB (ref 80–100)
MONO ABS: 0.9 x10 3/mm (ref 0.2–0.9)
Monocyte %: 6.3 %
Neutrophil #: 11.4 x10 3/mm — ABNORMAL HIGH (ref 1.4–6.5)
Neutrophil %: 78.9 %
Platelet: 575 x10 3/mm — ABNORMAL HIGH (ref 150–440)
RBC: 6.06 10*6/uL — ABNORMAL HIGH (ref 3.80–5.20)
RDW: 19.9 % — ABNORMAL HIGH (ref 11.5–14.5)
WBC: 14.5 x10 3/mm — ABNORMAL HIGH (ref 3.6–11.0)

## 2014-02-16 LAB — CBC CANCER CENTER
Basophil #: 0 x10 3/mm (ref 0.0–0.1)
Basophil %: 0.2 %
EOS PCT: 2.3 %
Eosinophil #: 0.3 x10 3/mm (ref 0.0–0.7)
HCT: 40.2 % (ref 35.0–47.0)
HGB: 12.1 g/dL (ref 12.0–16.0)
Lymphocyte #: 2 x10 3/mm (ref 1.0–3.6)
Lymphocyte %: 16.9 %
MCH: 20.2 pg — AB (ref 26.0–34.0)
MCHC: 30 g/dL — ABNORMAL LOW (ref 32.0–36.0)
MCV: 67 fL — AB (ref 80–100)
Monocyte #: 0.6 x10 3/mm (ref 0.2–0.9)
Monocyte %: 4.8 %
Neutrophil #: 9.1 x10 3/mm — ABNORMAL HIGH (ref 1.4–6.5)
Neutrophil %: 75.8 %
Platelet: 530 x10 3/mm — ABNORMAL HIGH (ref 150–440)
RBC: 5.99 10*6/uL — ABNORMAL HIGH (ref 3.80–5.20)
RDW: 20 % — AB (ref 11.5–14.5)
WBC: 12.1 x10 3/mm — ABNORMAL HIGH (ref 3.6–11.0)

## 2014-02-16 LAB — BASIC METABOLIC PANEL
Anion Gap: 1 — ABNORMAL LOW (ref 7–16)
BUN: 9 mg/dL (ref 7–18)
CO2: 30 mmol/L (ref 21–32)
CREATININE: 1.03 mg/dL (ref 0.60–1.30)
Calcium, Total: 8.4 mg/dL — ABNORMAL LOW (ref 8.5–10.1)
Chloride: 110 mmol/L — ABNORMAL HIGH (ref 98–107)
EGFR (African American): >60
GLUCOSE: 85 mg/dL (ref 65–99)
OSMOLALITY: 279 (ref 275–301)
Potassium: 3.9 mmol/L (ref 3.5–5.1)
SODIUM: 141 mmol/L (ref 136–145)

## 2014-02-16 LAB — MAGNESIUM: Magnesium: 2 mg/dL

## 2014-02-25 ENCOUNTER — Ambulatory Visit: Payer: Self-pay | Admitting: Internal Medicine

## 2014-03-02 LAB — CBC CANCER CENTER
BASOS PCT: 1.2 %
Basophil #: 0.1 x10 3/mm (ref 0.0–0.1)
EOS PCT: 2.7 %
Eosinophil #: 0.3 x10 3/mm (ref 0.0–0.7)
HCT: 39.3 % (ref 35.0–47.0)
HGB: 12.4 g/dL (ref 12.0–16.0)
Lymphocyte #: 2 x10 3/mm (ref 1.0–3.6)
Lymphocyte %: 18.9 %
MCH: 20.9 pg — AB (ref 26.0–34.0)
MCHC: 31.6 g/dL — AB (ref 32.0–36.0)
MCV: 66 fL — ABNORMAL LOW (ref 80–100)
MONOS PCT: 5.8 %
Monocyte #: 0.6 x10 3/mm (ref 0.2–0.9)
Neutrophil #: 7.6 x10 3/mm — ABNORMAL HIGH (ref 1.4–6.5)
Neutrophil %: 71.4 %
Platelet: 475 x10 3/mm — ABNORMAL HIGH (ref 150–440)
RBC: 5.94 10*6/uL — ABNORMAL HIGH (ref 3.80–5.20)
RDW: 19.6 % — AB (ref 11.5–14.5)
WBC: 10.7 x10 3/mm (ref 3.6–11.0)

## 2014-03-23 LAB — CBC CANCER CENTER
BASOS ABS: 0.1 x10 3/mm (ref 0.0–0.1)
Basophil %: 1.1 %
EOS ABS: 0.2 x10 3/mm (ref 0.0–0.7)
Eosinophil %: 1.4 %
HCT: 39.3 % (ref 35.0–47.0)
HGB: 12.2 g/dL (ref 12.0–16.0)
Lymphocyte #: 3.5 x10 3/mm (ref 1.0–3.6)
Lymphocyte %: 27.8 %
MCH: 20.7 pg — ABNORMAL LOW (ref 26.0–34.0)
MCHC: 31.1 g/dL — ABNORMAL LOW (ref 32.0–36.0)
MCV: 67 fL — ABNORMAL LOW (ref 80–100)
MONO ABS: 0.9 x10 3/mm (ref 0.2–0.9)
MONOS PCT: 6.9 %
Neutrophil #: 7.9 x10 3/mm — ABNORMAL HIGH (ref 1.4–6.5)
Neutrophil %: 62.8 %
PLATELETS: 496 x10 3/mm — AB (ref 150–440)
RBC: 5.91 10*6/uL — ABNORMAL HIGH (ref 3.80–5.20)
RDW: 19.8 % — ABNORMAL HIGH (ref 11.5–14.5)
WBC: 12.6 x10 3/mm — AB (ref 3.6–11.0)

## 2014-03-28 ENCOUNTER — Ambulatory Visit: Payer: Self-pay | Admitting: Internal Medicine

## 2014-04-06 LAB — CREATININE, SERUM
Creatinine: 0.91 mg/dL (ref 0.60–1.30)
EGFR (Non-African Amer.): >60

## 2014-04-06 LAB — CBC CANCER CENTER
Basophil #: 0.1 x10 3/mm (ref 0.0–0.1)
Basophil %: 1.4 %
EOS ABS: 0.2 x10 3/mm (ref 0.0–0.7)
Eosinophil %: 2.8 %
HCT: 39.4 % (ref 35.0–47.0)
HGB: 12.1 g/dL (ref 12.0–16.0)
LYMPHS PCT: 20.6 %
Lymphocyte #: 1.7 x10 3/mm (ref 1.0–3.6)
MCH: 20.6 pg — AB (ref 26.0–34.0)
MCHC: 30.7 g/dL — ABNORMAL LOW (ref 32.0–36.0)
MCV: 67 fL — ABNORMAL LOW (ref 80–100)
MONO ABS: 0.7 x10 3/mm (ref 0.2–0.9)
Monocyte %: 8 %
Neutrophil #: 5.6 x10 3/mm (ref 1.4–6.5)
Neutrophil %: 67.2 %
Platelet: 406 x10 3/mm (ref 150–440)
RBC: 5.88 10*6/uL — ABNORMAL HIGH (ref 3.80–5.20)
RDW: 19.6 % — AB (ref 11.5–14.5)
WBC: 8.4 x10 3/mm (ref 3.6–11.0)

## 2014-04-06 LAB — MAGNESIUM: Magnesium: 2 mg/dL

## 2014-04-06 LAB — POTASSIUM: POTASSIUM: 4.2 mmol/L (ref 3.5–5.1)

## 2014-04-25 LAB — CBC CANCER CENTER
Basophil #: 0.1 x10 3/mm (ref 0.0–0.1)
Basophil %: 0.4 %
EOS ABS: 0.2 x10 3/mm (ref 0.0–0.7)
EOS PCT: 1.4 %
HCT: 39.8 % (ref 35.0–47.0)
HGB: 12.3 g/dL (ref 12.0–16.0)
LYMPHS ABS: 3.4 x10 3/mm (ref 1.0–3.6)
Lymphocyte %: 22.1 %
MCH: 20.6 pg — ABNORMAL LOW (ref 26.0–34.0)
MCHC: 31 g/dL — ABNORMAL LOW (ref 32.0–36.0)
MCV: 66 fL — ABNORMAL LOW (ref 80–100)
Monocyte #: 0.9 x10 3/mm (ref 0.2–0.9)
Monocyte %: 6.2 %
NEUTROS PCT: 69.9 %
Neutrophil #: 10.7 x10 3/mm — ABNORMAL HIGH (ref 1.4–6.5)
Platelet: 375 x10 3/mm (ref 150–440)
RBC: 5.99 10*6/uL — AB (ref 3.80–5.20)
RDW: 20.4 % — ABNORMAL HIGH (ref 11.5–14.5)
WBC: 15.3 x10 3/mm — ABNORMAL HIGH (ref 3.6–11.0)

## 2014-04-27 ENCOUNTER — Ambulatory Visit: Payer: Self-pay | Admitting: Internal Medicine

## 2014-05-04 LAB — CREATININE, SERUM
Creatinine: 0.94 mg/dL (ref 0.60–1.30)
EGFR (Non-African Amer.): >60

## 2014-05-04 LAB — CANCER CENTER HEMATOCRIT: HCT: 40.1 % (ref 35.0–47.0)

## 2014-05-18 LAB — CBC CANCER CENTER
BASOS PCT: 1.1 %
Basophil #: 0.1 x10 3/mm (ref 0.0–0.1)
EOS ABS: 0.2 x10 3/mm (ref 0.0–0.7)
EOS PCT: 2.6 %
HCT: 39.1 % (ref 35.0–47.0)
HGB: 12.1 g/dL (ref 12.0–16.0)
LYMPHS PCT: 17.7 %
Lymphocyte #: 1.7 x10 3/mm (ref 1.0–3.6)
MCH: 20.5 pg — ABNORMAL LOW (ref 26.0–34.0)
MCHC: 31 g/dL — ABNORMAL LOW (ref 32.0–36.0)
MCV: 66 fL — ABNORMAL LOW (ref 80–100)
Monocyte #: 0.6 x10 3/mm (ref 0.2–0.9)
Monocyte %: 6.3 %
Neutrophil #: 6.9 x10 3/mm — ABNORMAL HIGH (ref 1.4–6.5)
Neutrophil %: 72.3 %
Platelet: 411 x10 3/mm (ref 150–440)
RBC: 5.9 10*6/uL — ABNORMAL HIGH (ref 3.80–5.20)
RDW: 20.9 % — ABNORMAL HIGH (ref 11.5–14.5)
WBC: 9.6 x10 3/mm (ref 3.6–11.0)

## 2014-05-23 DIAGNOSIS — E109 Type 1 diabetes mellitus without complications: Secondary | ICD-10-CM | POA: Insufficient documentation

## 2014-05-27 ENCOUNTER — Ambulatory Visit: Payer: Self-pay | Admitting: Primary Care

## 2014-05-28 ENCOUNTER — Ambulatory Visit: Payer: Self-pay | Admitting: Internal Medicine

## 2014-06-10 LAB — CBC CANCER CENTER
Basophil #: 0 x10 3/mm (ref 0.0–0.1)
Basophil %: 0.3 %
EOS ABS: 0.5 x10 3/mm (ref 0.0–0.7)
Eosinophil %: 4.3 %
HCT: 42.7 % (ref 35.0–47.0)
HGB: 13 g/dL (ref 12.0–16.0)
LYMPHS ABS: 2 x10 3/mm (ref 1.0–3.6)
Lymphocyte %: 18.8 %
MCH: 20.7 pg — ABNORMAL LOW (ref 26.0–34.0)
MCHC: 30.5 g/dL — ABNORMAL LOW (ref 32.0–36.0)
MCV: 68 fL — ABNORMAL LOW (ref 80–100)
MONOS PCT: 4.6 %
Monocyte #: 0.5 x10 3/mm (ref 0.2–0.9)
Neutrophil #: 7.7 x10 3/mm — ABNORMAL HIGH (ref 1.4–6.5)
Neutrophil %: 72 %
Platelet: 462 x10 3/mm — ABNORMAL HIGH (ref 150–440)
RBC: 6.28 10*6/uL — ABNORMAL HIGH (ref 3.80–5.20)
RDW: 21.5 % — AB (ref 11.5–14.5)
WBC: 10.6 x10 3/mm (ref 3.6–11.0)

## 2014-06-10 LAB — URINALYSIS, COMPLETE
Bilirubin,UR: NEGATIVE
Blood: NEGATIVE
Glucose,UR: NEGATIVE mg/dL (ref 0–75)
KETONE: NEGATIVE
NITRITE: POSITIVE
PROTEIN: NEGATIVE
Ph: 5 (ref 4.5–8.0)
RBC,UR: 3 /HPF (ref 0–5)
Specific Gravity: 1.018 (ref 1.003–1.030)
Squamous Epithelial: 4
WBC UR: 33 /HPF (ref 0–5)

## 2014-06-12 LAB — URINE CULTURE

## 2014-06-28 ENCOUNTER — Ambulatory Visit: Payer: Self-pay | Admitting: Internal Medicine

## 2014-07-08 LAB — CBC CANCER CENTER
Basophil #: 0 x10 3/mm (ref 0.0–0.1)
Basophil %: 0.2 %
Eosinophil #: 0.3 x10 3/mm (ref 0.0–0.7)
Eosinophil %: 3.3 %
HCT: 41.9 % (ref 35.0–47.0)
HGB: 13 g/dL (ref 12.0–16.0)
LYMPHS ABS: 1.7 x10 3/mm (ref 1.0–3.6)
LYMPHS PCT: 17.4 %
MCH: 20.8 pg — ABNORMAL LOW (ref 26.0–34.0)
MCHC: 31 g/dL — ABNORMAL LOW (ref 32.0–36.0)
MCV: 67 fL — ABNORMAL LOW (ref 80–100)
MONOS PCT: 5.4 %
Monocyte #: 0.5 x10 3/mm (ref 0.2–0.9)
NEUTROS PCT: 73.7 %
Neutrophil #: 7 x10 3/mm — ABNORMAL HIGH (ref 1.4–6.5)
Platelet: 456 x10 3/mm — ABNORMAL HIGH (ref 150–440)
RBC: 6.24 10*6/uL — AB (ref 3.80–5.20)
RDW: 20 % — ABNORMAL HIGH (ref 11.5–14.5)
WBC: 9.5 x10 3/mm (ref 3.6–11.0)

## 2014-07-08 LAB — BASIC METABOLIC PANEL
Anion Gap: 7 (ref 7–16)
BUN: 7 mg/dL (ref 7–18)
CALCIUM: 8.5 mg/dL (ref 8.5–10.1)
Chloride: 103 mmol/L (ref 98–107)
Co2: 28 mmol/L (ref 21–32)
Creatinine: 0.86 mg/dL (ref 0.60–1.30)
EGFR (African American): >60
GLUCOSE: 121 mg/dL — AB (ref 65–99)
Osmolality: 275 (ref 275–301)
Potassium: 4.7 mmol/L (ref 3.5–5.1)
Sodium: 138 mmol/L (ref 136–145)

## 2014-07-08 LAB — MAGNESIUM: Magnesium: 1.9 mg/dL

## 2014-07-15 DIAGNOSIS — R05 Cough: Secondary | ICD-10-CM | POA: Insufficient documentation

## 2014-07-15 DIAGNOSIS — R51 Headache: Secondary | ICD-10-CM

## 2014-07-15 DIAGNOSIS — R519 Headache, unspecified: Secondary | ICD-10-CM | POA: Insufficient documentation

## 2014-07-15 DIAGNOSIS — R053 Chronic cough: Secondary | ICD-10-CM | POA: Insufficient documentation

## 2014-07-15 DIAGNOSIS — R079 Chest pain, unspecified: Secondary | ICD-10-CM | POA: Insufficient documentation

## 2014-07-15 DIAGNOSIS — H5501 Congenital nystagmus: Secondary | ICD-10-CM | POA: Insufficient documentation

## 2014-07-18 LAB — CANCER CENTER HEMATOCRIT: HCT: 38.3 % (ref 35.0–47.0)

## 2014-07-28 ENCOUNTER — Ambulatory Visit: Payer: Self-pay | Admitting: Internal Medicine

## 2014-07-28 DIAGNOSIS — N3281 Overactive bladder: Secondary | ICD-10-CM | POA: Insufficient documentation

## 2014-07-28 DIAGNOSIS — J45909 Unspecified asthma, uncomplicated: Secondary | ICD-10-CM | POA: Insufficient documentation

## 2014-08-15 LAB — CBC CANCER CENTER
BASOS PCT: 0.9 %
Basophil #: 0.1 x10 3/mm (ref 0.0–0.1)
EOS ABS: 0.3 x10 3/mm (ref 0.0–0.7)
EOS PCT: 1.9 %
HCT: 39.3 % (ref 35.0–47.0)
HGB: 12 g/dL (ref 12.0–16.0)
LYMPHS ABS: 2.3 x10 3/mm (ref 1.0–3.6)
Lymphocyte %: 14.4 %
MCH: 20.5 pg — ABNORMAL LOW (ref 26.0–34.0)
MCHC: 30.6 g/dL — ABNORMAL LOW (ref 32.0–36.0)
MCV: 67 fL — AB (ref 80–100)
MONO ABS: 0.8 x10 3/mm (ref 0.2–0.9)
Monocyte %: 4.8 %
NEUTROS ABS: 12.8 x10 3/mm — AB (ref 1.4–6.5)
Neutrophil %: 78 %
PLATELETS: 508 x10 3/mm — AB (ref 150–440)
RBC: 5.86 10*6/uL — AB (ref 3.80–5.20)
RDW: 19.2 % — ABNORMAL HIGH (ref 11.5–14.5)
WBC: 16.3 x10 3/mm — AB (ref 3.6–11.0)

## 2014-08-28 ENCOUNTER — Ambulatory Visit: Payer: Self-pay | Admitting: Internal Medicine

## 2014-08-29 DIAGNOSIS — E782 Mixed hyperlipidemia: Secondary | ICD-10-CM | POA: Insufficient documentation

## 2014-09-05 LAB — CBC CANCER CENTER
BASOS PCT: 2.1 %
Basophil #: 0.3 x10 3/mm — ABNORMAL HIGH (ref 0.0–0.1)
EOS ABS: 0.1 x10 3/mm (ref 0.0–0.7)
EOS PCT: 1 %
HCT: 42.6 % (ref 35.0–47.0)
HGB: 12.7 g/dL (ref 12.0–16.0)
LYMPHS PCT: 4.4 %
Lymphocyte #: 0.7 x10 3/mm — ABNORMAL LOW (ref 1.0–3.6)
MCH: 20.4 pg — AB (ref 26.0–34.0)
MCHC: 29.9 g/dL — ABNORMAL LOW (ref 32.0–36.0)
MCV: 68 fL — ABNORMAL LOW (ref 80–100)
MONO ABS: 0.2 x10 3/mm (ref 0.2–0.9)
Monocyte %: 1.5 %
Neutrophil #: 13.7 x10 3/mm — ABNORMAL HIGH (ref 1.4–6.5)
Neutrophil %: 91 %
PLATELETS: 527 x10 3/mm — AB (ref 150–440)
RBC: 6.25 10*6/uL — AB (ref 3.80–5.20)
RDW: 18.4 % — AB (ref 11.5–14.5)
WBC: 15.1 x10 3/mm — ABNORMAL HIGH (ref 3.6–11.0)

## 2014-09-12 LAB — CBC CANCER CENTER
BASOS ABS: 0.2 x10 3/mm — AB (ref 0.0–0.1)
BASOS PCT: 1.2 %
Eosinophil #: 0.3 x10 3/mm (ref 0.0–0.7)
Eosinophil %: 2.3 %
HCT: 39.2 % (ref 35.0–47.0)
HGB: 11.9 g/dL — ABNORMAL LOW (ref 12.0–16.0)
LYMPHS PCT: 17.9 %
Lymphocyte #: 2.4 x10 3/mm (ref 1.0–3.6)
MCH: 20.2 pg — ABNORMAL LOW (ref 26.0–34.0)
MCHC: 30.3 g/dL — ABNORMAL LOW (ref 32.0–36.0)
MCV: 67 fL — ABNORMAL LOW (ref 80–100)
Monocyte #: 0.7 x10 3/mm (ref 0.2–0.9)
Monocyte %: 5.5 %
NEUTROS PCT: 73.1 %
Neutrophil #: 9.8 x10 3/mm — ABNORMAL HIGH (ref 1.4–6.5)
Platelet: 492 x10 3/mm — ABNORMAL HIGH (ref 150–440)
RBC: 5.88 10*6/uL — ABNORMAL HIGH (ref 3.80–5.20)
RDW: 18.5 % — ABNORMAL HIGH (ref 11.5–14.5)
WBC: 13.3 x10 3/mm — AB (ref 3.6–11.0)

## 2014-09-27 ENCOUNTER — Ambulatory Visit: Payer: Self-pay | Admitting: Internal Medicine

## 2014-09-27 ENCOUNTER — Ambulatory Visit: Payer: Self-pay | Admitting: Family Medicine

## 2014-10-04 ENCOUNTER — Encounter: Payer: Self-pay | Admitting: Nurse Practitioner

## 2014-10-04 NOTE — Progress Notes (Signed)
This encounter was created in error - please disregard.

## 2014-10-05 LAB — CBC CANCER CENTER
BASOS ABS: 0.2 x10 3/mm — AB (ref 0.0–0.1)
Basophil %: 1.3 %
Eosinophil #: 0.1 x10 3/mm (ref 0.0–0.7)
Eosinophil %: 0.9 %
HCT: 39.4 % (ref 35.0–47.0)
HGB: 11.8 g/dL — ABNORMAL LOW (ref 12.0–16.0)
LYMPHS ABS: 1.8 x10 3/mm (ref 1.0–3.6)
LYMPHS PCT: 12.3 %
MCH: 19.6 pg — ABNORMAL LOW (ref 26.0–34.0)
MCHC: 30 g/dL — ABNORMAL LOW (ref 32.0–36.0)
MCV: 65 fL — ABNORMAL LOW (ref 80–100)
MONO ABS: 0.5 x10 3/mm (ref 0.2–0.9)
MONOS PCT: 3.6 %
Neutrophil #: 11.8 x10 3/mm — ABNORMAL HIGH (ref 1.4–6.5)
Neutrophil %: 81.9 %
Platelet: 486 x10 3/mm — ABNORMAL HIGH (ref 150–440)
RBC: 6.02 10*6/uL — AB (ref 3.80–5.20)
RDW: 18.3 % — ABNORMAL HIGH (ref 11.5–14.5)
WBC: 14.4 x10 3/mm — ABNORMAL HIGH (ref 3.6–11.0)

## 2014-10-28 ENCOUNTER — Ambulatory Visit: Payer: Self-pay | Admitting: Internal Medicine

## 2014-11-03 LAB — CBC CANCER CENTER
Basophil #: 0.1 x10 3/mm (ref 0.0–0.1)
Basophil %: 1.3 %
EOS ABS: 0.3 x10 3/mm (ref 0.0–0.7)
Eosinophil %: 3.3 %
HCT: 38.9 % (ref 35.0–47.0)
HGB: 12 g/dL (ref 12.0–16.0)
LYMPHS ABS: 2 x10 3/mm (ref 1.0–3.6)
Lymphocyte %: 24.2 %
MCH: 20 pg — AB (ref 26.0–34.0)
MCHC: 30.9 g/dL — AB (ref 32.0–36.0)
MCV: 65 fL — AB (ref 80–100)
MONO ABS: 0.6 x10 3/mm (ref 0.2–0.9)
Monocyte %: 7.2 %
NEUTROS PCT: 64 %
Neutrophil #: 5.3 x10 3/mm (ref 1.4–6.5)
PLATELETS: 463 x10 3/mm — AB (ref 150–440)
RBC: 6.03 10*6/uL — ABNORMAL HIGH (ref 3.80–5.20)
RDW: 19.2 % — ABNORMAL HIGH (ref 11.5–14.5)
WBC: 8.3 x10 3/mm (ref 3.6–11.0)

## 2014-11-09 ENCOUNTER — Ambulatory Visit: Payer: Self-pay | Admitting: Specialist

## 2014-11-28 ENCOUNTER — Ambulatory Visit: Payer: Self-pay | Admitting: Internal Medicine

## 2014-12-27 ENCOUNTER — Ambulatory Visit
Admit: 2014-12-27 | Disposition: A | Discharge status: Home / Self Care | Payer: Self-pay | Attending: Internal Medicine | Admitting: Internal Medicine

## 2015-01-25 LAB — CBC CANCER CENTER
BASOS PCT: 2.5 %
Basophil #: 0.2 x10 3/mm — ABNORMAL HIGH (ref 0.0–0.1)
Eosinophil #: 0.4 x10 3/mm (ref 0.0–0.7)
Eosinophil %: 4.4 %
HCT: 38 % (ref 35.0–47.0)
HGB: 11.5 g/dL — AB (ref 12.0–16.0)
LYMPHS PCT: 19.9 %
Lymphocyte #: 1.7 x10 3/mm (ref 1.0–3.6)
MCH: 19.4 pg — ABNORMAL LOW (ref 26.0–34.0)
MCHC: 30.3 g/dL — ABNORMAL LOW (ref 32.0–36.0)
MCV: 64 fL — ABNORMAL LOW (ref 80–100)
MONOS PCT: 5.6 %
Monocyte #: 0.5 x10 3/mm (ref 0.2–0.9)
NEUTROS ABS: 5.6 x10 3/mm (ref 1.4–6.5)
Neutrophil %: 67.6 %
Platelet: 374 x10 3/mm (ref 150–440)
RBC: 5.93 10*6/uL — ABNORMAL HIGH (ref 3.80–5.20)
RDW: 19 % — ABNORMAL HIGH (ref 11.5–14.5)
WBC: 8.3 x10 3/mm (ref 3.6–11.0)

## 2015-01-27 ENCOUNTER — Ambulatory Visit
Admit: 2015-01-27 | Disposition: A | Discharge status: Home / Self Care | Payer: Self-pay | Attending: Internal Medicine | Admitting: Internal Medicine

## 2015-01-28 DIAGNOSIS — T85111A Breakdown (mechanical) of implanted electronic neurostimulator (electrode) of peripheral nerve, initial encounter: Secondary | ICD-10-CM | POA: Insufficient documentation

## 2015-01-30 LAB — CREATININE, SERUM
Creatinine: 0.72 mg/dL
EGFR (Non-African Amer.): >60

## 2015-02-18 NOTE — H&P (Signed)
PATIENT NAME:  Lauren Escobar, GALENTINE MR#:  [id number] DATE OF BIRTH:  1966/03/22  DATE OF ADMISSION:  12/31/2013  PRIMARY CARE PHYSICIAN: Dr. Dema Severin at [place]: Dr. Manuella Ghazi  PRIMARY GASTROENTEROLOGIST: Dr. Vira Agar   PRIMARY ONCOLOGIST: Dr. Inez Pilgrim  REFERRING PHYSICIAN: Dr. Jimmye Norman  CHIEF COMPLAINT: Passed out.   HISTORY OF PRESENT ILLNESS: The patient is a 49 year old with history of polycythemia vera and splenomegaly who was just at Dr. Marylene Land office to be evaluated to see if she needs another bout of phlebotomy or not. Furthermore, she has a history of hypertension, COPD, still smoker, and von Willebrand disease. Of note, for the past 2 or 3 months, she has been having chronic diarrhea that is being worked up as an outpatient. Initial thought was that this could be a VIPoma. It appears that she underwent an ondansetron test, which was negative. She is getting evaluated by Dr. Vira Agar for that. While at the clinic, she was sitting and attempted to get up or time around, she does not know which. She passed out. It was witnessed. She denied having any pains in the chest or palpitations at that time. She was brought in here where she underwent a CAT scan of the head, which was negative for acute stroke. Hospitalist services were contacted for further evaluation and management. The patient denies having any chest pain, palpitations. Has chronic shortness of breath.   PAST MEDICAL HISTORY:  1.  Polycythemia vera. 2.  Splenomegaly, undergoing as needed phlebotomy by Dr. Inez Pilgrim. 3.  Chronic diarrhea for the past 2 or 3 months, getting worked up by GI as an outpatient with negative octreotide scan. 4.  Von Willebrand disease.  5.  Hypertension.  6.  Depression.  7.  COPD with ongoing tobacco abuse.  8.  Emphysema.  9.  Diabetes.  10.  Gastric ulcer.  11.  Chronic abdominal pain.  12.  GERD. 13.  Endometriosis.  14.  Total left knee replacement. 15.  Breast  surgery. 16.  Bladder implant. 17.  Shoulder and leg surgeries. 18.  Hysterectomy.   ALLERGIES: No known drug allergies.  SOCIAL HISTORY: Smokes a pack a day. Denies alcohol or drug use.   FAMILY HISTORY: Grandmother with cancer, otherwise, she does not know.   OUTPATIENT MEDICATIONS: Abilify 10 mg once a day, Advair 500/50 mcg 1 puff 2 times a day, albuterol p.r.n., Anusol 1 suppository once a day at bedtime, buspirone 30 mg 2 times a day, Dexilant 60 mg once a day, diclofenac sodium 75 mg 2 times a day as needed, Lyrica 100 mg 2 times a day, metformin 500 mg once a day, nitrofurantoin microcrystal 100 mg once a day,  Norco 325/7.5 one tab 4 times a day as needed, Spiriva 18 mcg inhaled once a day, sumatriptan 50 mg once a day as needed for migraines, Valium 10 mg 2 times a day, Zyprexa 5 mg once a day.   REVIEW OF SYSTEMS: CONSTITUTIONAL: Denies any fevers, chills. Thinks she has gained weight recently.  EYES: Denies blurry vision or double vision.  ENT: Denies tinnitus, hearing loss, discharge, or cough.  RESPIRATORY: No cough, wheezing. Has COPD. CARDIOVASCULAR: No chest pain, orthopnea or swelling in the legs. Denies palpitations.  GASTROINTESTINAL: Positive for chronic diarrhea which is watery, also more recently has had some dysphagia with some foods. Has chronic abdominal pain. No black or tarry stools.  GENITOURINARY: Has dysuria, increased frequency.  ENDOCRINE: Denies polyuria or nocturia. HEMATOLOGIC AND LYMPHATIC: Has polycythemia  vera and von Willebrand's. No recent bleeding.  MUSCULOSKELETAL: Has chronic arthritis.  NEUROLOGIC: No focal weakness or numbness, but has had chronic numbness in the lower extremities. PSYCH: Has depression.   PHYSICAL EXAMINATION: VITAL SIGNS: Temperature on arrival 97.8, pulse rate 76, respiratory rate 18, blood pressure 111/56, pulse ox 99% on room air.  GENERAL: The patient is an obese female, lying in bed, talking in full sentences. No  obvious distress.  HEENT: Normocephalic, atraumatic. Pupils are equal and reactive. Somewhat dry mucous membranes.  NECK: Supple. No thyroid tenderness. No cervical lymphadenopathy.  CARDIOVASCULAR: S1, S2. No significant murmurs or rubs.  LUNGS: Clear to auscultation. No wheezing, rhonchi or rales.  ABDOMEN: Soft, hyperactive. No significant tenderness.  EXTREMITIES: No pitting edema.  NEUROLOGIC: Cranial nerves II through XII grossly intact. Strength is 5 out of 5 in all extremities. The patient has subjective sensation in feet and lower extremities bilaterally.  PSYCHIATRIC: Awake, alert, and oriented x3.  SKIN: No obvious rashes or lesions.   LABS AND IMAGING: CT of the head as above.   EKG: Normal sinus rhythm, low voltage QRS. No acute ST elevations or depressions.  BUN 9, creatinine 0.81, sodium 143, potassium 3.7. Troponin negative. White count 8.8, hemoglobin 12.1, platelets  408,000, MCV 67. UA: 1+ leukocyte esterase, 36 WBC, 2+ bacteria.   ASSESSMENT AND PLAN: We have a 49 year old who was in Dr. Marylene Land office for evaluation for phlebotomy for her polycythemia vera and splenomegaly who also has von Willebrand's disease with chronic diarrhea, diabetes, COPD, and ongoing tobacco abuse who comes in following syncope. She denied having any palpitations. Of note here, she also has some orthostatic vitals done which do not exhibit orthostasis. Ideology of syncope is not known, but the patient does have a dirty urine which could imply she has a urinary tract infection, but do not know if that has caused her syncope. Furthermore, the patient has had chronic diarrhea with multiple episodes of loose watery stools per day that is getting worked up as an outpatient. There are no recent antibiotics or hospitalizations to suggest this is Clostridium difficile nor does she have fever or white count, and I do not think that this is an infectious process, but I would go ahead and obtain a  gastroenterology consult for this chronic diarrhea, obtain stool cultures as well. She is a little dehydrated on exam and I would go ahead and start her on some IV fluids. We will start her on ceftriaxone for possible urinary tract infection as she is symptomatic and has a dirty urine and send in a urine culture. I would check an echocardiogram as well as ultrasound of the carotids. She is neurologically intact at this point. I would monitor her with remote telemetry to see if she has any significant arrhythmias as a cause. She does not appear to have significant electrolyte abnormalities and a negative troponin. I would cycle the troponins. I would hold the metformin. She is on a couple of psychiatric medications which could potentially cause syncope, but they are not new and they have been going on for a year. She denied having syncope in the past. Therefore, I think those are less likely. It could be the combination of a urinary tract infection and gastrointestinal losses/dehydration causing the syncope. Would follow with some neuro checks while she is here. She is FULL code. She was counseled for her smoking for 3 minutes and I would start her on a nicotine patch. Her chronic obstructive pulmonary disease is  stable without significant wheezing, and I would continue her inhalers. ___________________________ Vivien Presto, MD sa:sb D: 12/31/2013 15:41:00 ET T: 12/31/2013 16:10:31 ET JOB#: [id number]  cc: Vivien Presto, MD, <Dictator> Simonne Come. Inez Pilgrim, MD Manya Silvas, MD Dr. Dema Severin - [facility] MD ELECTRONICALLY SIGNED 01/27/2014 15:36

## 2015-02-18 NOTE — Discharge Summary (Signed)
PATIENT NAME:  Lauren Escobar, Lauren Escobar MR#:  [id number] DATE OF BIRTH:  07-07-1966  DATE OF ADMISSION:  12/31/2013 DATE OF DISCHARGE:  01/01/2014  PRESENTING COMPLAINT:  "I passed out."  DISCHARGE DIAGNOSES: 1.  Syncope, suspected due to urinary tract infection. Mild dehydration, resolved.  2.  Hypertension.  3.  Emphysema.  4.  Ongoing tobacco abuse. 5.  Depression.  CODE STATUS: Full code.  MEDICATIONS: 1.  Spiriva 18 mcg inhalation p.o. daily.  2.  Zyprexa 5 mg p.o. daily.  3.  Advair Diskus 500/50, 1 puff b.i.d.  4.  Diclofenac 75 mg b.i.d. p.r.n.  5.  Abilify 10 mg p.o. daily.  6.  Buspirone 30 mg b.i.d.  7.  Anusol-HC 25 rectal suppository once a day at bedtime.  8.  Sumatriptan 50 mg p.o. daily as needed.  9.  Dexilant 60 mg daily.  10.  Lyrica 100 mg p.o. b.i.d.  11.  Albuterol 1 puff inhalation as needed.  12.  Metformin 500 mg daily.  13.  Norco 325/7.5, 1 tablet 4 times a day as needed.  14.  Valium 10 mg b.i.d.  15.  Cipro 500 mg b.i.d.   DIET: Low sodium diet.   FOLLOWUP: With Barnie Del, PA, at [facility].  Echo Doppler showed EF of 55% to 60%, normal left ventricular systolic function.  H and H is 10.9 and 35.2. White count is 6.9. Platelet count is 410.  Metabolic panel within normal limits. Magnesium 1.8, hemoglobin A1c is 5.5. Lipid profile within normal limits. TSH is 1.33. Cardiac enzymes x 3 negative.  Ultrasound carotid Doppler was negative.  CT of the head without contrast shows no gross intracranial abnormality. UA positive for UTI. Urine culture positive for Citrobacter.  BRIEF SUMMARY OF HOSPITAL COURSE: [name] is a 49 year old Caucasian female with multiple medical problems comes to the Emergency Room after she had: 1. Syncopal-like episode. It was suspected due to mild dehydration and urinary tract infection after her cardiac workup was essentially negative. She was admitted on telemetry floor where she remained in regular rhythm.  Cardiac enzymes x 2 were negative. Blood pressure was stable. No orthostasis was noted. She was eating okay. CT head was negative. Carotid Doppler was negative as well.  2.  Urinary tract infection. Urine culture was positive for Citrobacter. She will be finishing up a course with Cipro.  3.  Chronic diarrhea. Follow up with Dr. Vira Agar as outpatient.  4.  Emphysema with ongoing tobacco abuse remained stable. The patient was advised on smoking cessation.  5.  History of depression. Continue antidepressants.  Hospital stay otherwise remained stable. The patient remained a full code.   TIME SPENT: 40 minutes   ____________________________ Gus Height A. Posey Pronto, MD sap:ce D: 01/04/2014 15:10:16 ET T: 01/04/2014 15:34:16 ET JOB#: [id number]  cc: China Deitrick A. Posey Pronto, MD, <Dictator> Barnie Del, [place] Gus Height Hulda Humphrey MD ELECTRONICALLY SIGNED 01/11/2014 15:08

## 2015-02-22 LAB — CBC CANCER CENTER
BASOS PCT: 1.3 %
Basophil #: 0.1 x10 3/mm (ref 0.0–0.1)
Eosinophil #: 0.4 x10 3/mm (ref 0.0–0.7)
Eosinophil %: 3.8 %
HCT: 35.9 % (ref 35.0–47.0)
HGB: 11.5 g/dL — ABNORMAL LOW (ref 12.0–16.0)
LYMPHS ABS: 2 x10 3/mm (ref 1.0–3.6)
Lymphocyte %: 19.9 %
MCH: 20 pg — ABNORMAL LOW (ref 26.0–34.0)
MCHC: 31.9 g/dL — ABNORMAL LOW (ref 32.0–36.0)
MCV: 63 fL — ABNORMAL LOW (ref 80–100)
MONOS PCT: 4.3 %
Monocyte #: 0.4 x10 3/mm (ref 0.2–0.9)
NEUTROS ABS: 7.1 x10 3/mm — AB (ref 1.4–6.5)
Neutrophil %: 70.7 %
PLATELETS: 454 x10 3/mm — AB (ref 150–440)
RBC: 5.75 10*6/uL — AB (ref 3.80–5.20)
RDW: 18.6 % — ABNORMAL HIGH (ref 11.5–14.5)
WBC: 10 x10 3/mm (ref 3.6–11.0)

## 2015-03-22 ENCOUNTER — Ambulatory Visit: Payer: Self-pay | Admitting: Internal Medicine

## 2015-03-22 ENCOUNTER — Other Ambulatory Visit: Payer: Self-pay

## 2015-03-31 DIAGNOSIS — I10 Essential (primary) hypertension: Secondary | ICD-10-CM | POA: Insufficient documentation

## 2015-04-05 DIAGNOSIS — B373 Candidiasis of vulva and vagina: Secondary | ICD-10-CM | POA: Insufficient documentation

## 2015-04-05 DIAGNOSIS — B3731 Acute candidiasis of vulva and vagina: Secondary | ICD-10-CM | POA: Insufficient documentation

## 2015-04-07 ENCOUNTER — Other Ambulatory Visit: Payer: Self-pay

## 2015-04-07 ENCOUNTER — Ambulatory Visit: Payer: Self-pay | Admitting: Internal Medicine

## 2015-04-14 ENCOUNTER — Ambulatory Visit: Payer: Self-pay | Admitting: Family Medicine

## 2015-04-14 ENCOUNTER — Other Ambulatory Visit: Payer: Self-pay

## 2015-04-19 ENCOUNTER — Other Ambulatory Visit: Payer: Self-pay

## 2015-04-19 ENCOUNTER — Ambulatory Visit: Payer: Self-pay | Admitting: Family Medicine

## 2015-04-20 ENCOUNTER — Other Ambulatory Visit: Payer: Self-pay

## 2015-04-20 DIAGNOSIS — D45 Polycythemia vera: Secondary | ICD-10-CM | POA: Insufficient documentation

## 2015-04-20 DIAGNOSIS — Z0289 Encounter for other administrative examinations: Secondary | ICD-10-CM | POA: Insufficient documentation

## 2015-04-21 ENCOUNTER — Inpatient Hospital Stay (HOSPITAL_BASED_OUTPATIENT_CLINIC_OR_DEPARTMENT_OTHER): Payer: Medicare Other | Admitting: Family Medicine

## 2015-04-21 ENCOUNTER — Inpatient Hospital Stay: Payer: Medicare Other | Attending: Family Medicine

## 2015-04-21 VITALS — BP 120/82 | HR 85 | Temp 97.7°F | Wt 180.3 lb

## 2015-04-21 DIAGNOSIS — R161 Splenomegaly, not elsewhere classified: Secondary | ICD-10-CM | POA: Diagnosis not present

## 2015-04-21 DIAGNOSIS — Z79899 Other long term (current) drug therapy: Secondary | ICD-10-CM | POA: Insufficient documentation

## 2015-04-21 DIAGNOSIS — D45 Polycythemia vera: Secondary | ICD-10-CM | POA: Diagnosis present

## 2015-04-21 LAB — CBC
HEMATOCRIT: 38.2 % (ref 35.0–47.0)
HEMOGLOBIN: 11.6 g/dL — AB (ref 12.0–16.0)
MCH: 19.3 pg — AB (ref 26.0–34.0)
MCHC: 30.4 g/dL — ABNORMAL LOW (ref 32.0–36.0)
MCV: 63.4 fL — ABNORMAL LOW (ref 80.0–100.0)
Platelets: 407 10*3/uL (ref 150–440)
RBC: 6.02 MIL/uL — ABNORMAL HIGH (ref 3.80–5.20)
RDW: 21.3 % — ABNORMAL HIGH (ref 11.5–14.5)
WBC: 8.4 10*3/uL (ref 3.6–11.0)

## 2015-04-21 NOTE — Progress Notes (Signed)
[place]  Telephone:([phone]) [phone]  Fax:([phone]) [place] DOB: 29-Oct-1965  MR#: [id number]  OVZ#:[id number]  Patient Care Team: Letta Median, MD as PCP - General  CHIEF COMPLAINT:  Chief Complaint  Patient presents with  . Follow-up   Regarding polycythemia vera. Patient also with a history of splenomegaly in 1996 by ultrasound. Per Dr. Marylene Land previous notes target platelets below 500,000 and hematocrit 42-45. 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  INTERVAL HISTORY:  Patient is here for continued follow-up and further evaluation of polycythemia vera. She overall reports feeling very well. She denies any acute complaints today. Patient has been off Hydrea since June 2013. Of note patient developed diarrhea in 2014, was seen by Dr. Vira Agar. 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 Last CT scan in July 2015 revealed no pancreatic lesion, but did show some signs of colitis. Dr. Vira Agar at that time advised against colonoscopy, diarrhea has since improved.  REVIEW OF SYSTEMS:   Review of Systems  Constitutional: Negative for fever, chills, weight loss, malaise/fatigue and diaphoresis.  HENT: Negative for congestion, ear discharge, ear pain, hearing loss, nosebleeds, sore throat and tinnitus.   Eyes: Negative for blurred vision, double vision, photophobia, pain, discharge and redness.  Respiratory: Negative for cough, hemoptysis, sputum production, shortness of breath, wheezing and stridor.   Cardiovascular: Negative for chest pain, palpitations, orthopnea, claudication, leg swelling and PND.  Gastrointestinal: Negative for heartburn, nausea, vomiting, abdominal pain, diarrhea, constipation, blood in stool and melena.  Genitourinary: Negative.   Musculoskeletal: Negative.   Skin:  Negative.   Neurological: Negative for dizziness, tingling, focal weakness, seizures, weakness and headaches.  Endo/Heme/Allergies: Does not bruise/bleed easily.  Psychiatric/Behavioral: Negative for depression. The patient is not nervous/anxious and does not have insomnia.     As per HPI. Otherwise, a complete review of systems is negatve.  ONCOLOGY HISTORY:  No history exists.    PAST MEDICAL HISTORY: No past medical history on file.  PAST SURGICAL HISTORY: No past surgical history on file.  FAMILY HISTORY No family history on file.  GYNECOLOGIC HISTORY:  No LMP recorded.     ADVANCED DIRECTIVES:    HEALTH MAINTENANCE: History  Substance Use Topics  . Smoking status: Not on file  . Smokeless tobacco: Not on file  . Alcohol Use: Not on file     Colonoscopy:  PAP:  Bone density:  Lipid panel:  Allergies  Allergen Reactions  . Pregabalin Other (See Comments)    Unsteady gate, causing falls    Current Outpatient Prescriptions  Medication Sig Dispense Refill  . amphetamine-dextroamphetamine (ADDERALL) 10 MG tablet Take by mouth.    . ARIPiprazole (ABILIFY) 15 MG tablet Take 15 mg by mouth.    Marland Kitchen atorvastatin (LIPITOR) 20 MG tablet Take 20 mg by mouth.    Marland Kitchen buPROPion (BUDEPRION XL) 300 MG 24 hr tablet Take 300 mg by mouth.    . busPIRone (BUSPAR) 10 MG tablet Take by mouth.    . diazepam (VALIUM) 10 MG tablet Take 10 mg by mouth.    . diphenoxylate-atropine (LOMOTIL) 2.5-0.025 MG per tablet Take by mouth.    . esomeprazole (NEXIUM) 40 MG capsule Take 40 mg by mouth.    . estradiol (ESTRACE VAGINAL) 0.1 MG/GM vaginal cream Use small pea-sized application topically to the  urethral meatus each night at bedtime.    . flurbiprofen (ANSAID) 100 MG tablet Take 100 mg by mouth.    . Fluticasone-Salmeterol (ADVAIR DISKUS) 250-50 MCG/DOSE AEPB Inhale into the lungs.    . gabapentin (NEURONTIN) 300 MG capsule Take 600 mg by mouth.    Marland Kitchen HYDROcodone-acetaminophen (NORCO)  7.5-325 MG per tablet One tablet by mouth four times daily PRN. Do not fill before: 04/20/15, 05/20/15    . ipratropium-albuterol (DUONEB) 0.5-2.5 (3) MG/3ML SOLN 3 mL Q4H (RT).     . isosorbide mononitrate (IMDUR) 30 MG 24 hr tablet Take 30 mg by mouth.    . lisdexamfetamine (VYVANSE) 70 MG capsule Take 70 mg by mouth.    . lurasidone (LATUDA) 40 MG TABS tablet     . metFORMIN (GLUCOPHAGE) 500 MG tablet Take 500 mg by mouth.    . mirabegron ER (MYRBETRIQ) 50 MG TB24 tablet Take 50 mg by mouth.    . nitrofurantoin (MACRODANTIN) 100 MG capsule Take by mouth.    . nystatin (MYCOSTATIN) powder Apply topically.    . nystatin cream (MYCOSTATIN) Apply topically.    . propranolol (INDERAL) 40 MG tablet     . sucralfate (CARAFATE) 1 G tablet Take by mouth.    . SUMAtriptan (IMITREX) 50 MG tablet Take 50 mg by mouth.    . tamsulosin (FLOMAX) 0.4 MG CAPS capsule Take 0.4 mg by mouth.    . tiotropium (SPIRIVA HANDIHALER) 18 MCG inhalation capsule Frequency:BID   Dosage:18   MCG  Instructions:  Note:Dose: 18MCG    . topiramate (TOPAMAX) 50 MG tablet Take 50 mg by mouth.    . varenicline (CHANTIX) 1 MG tablet Take 1 mg by mouth.    . Vilazodone HCl (VIIBRYD) 40 MG TABS Take 40 mg by mouth.    . zaleplon (SONATA) 10 MG capsule Take 10 mg by mouth.     No current facility-administered medications for this visit.    OBJECTIVE: BP 120/82 mmHg  Pulse 85  Temp(Src) 97.7 F (36.5 C) (Tympanic)  Wt 180 lb 5.4 oz (81.8 kg)   There is no height on file to calculate BMI.    ECOG FS:0 - Asymptomatic  General: Well-developed, well-nourished, no acute distress. Eyes: Pink conjunctiva, anicteric sclera. HEENT: Normocephalic, moist mucous membranes, clear oropharnyx. Lungs: Clear to auscultation bilaterally. Heart: Regular rate and rhythm. No rubs, murmurs, or gallops. Abdomen: Soft, nontender, nondistended. No organomegaly noted, normoactive bowel sounds. Musculoskeletal: No edema, cyanosis, or  clubbing. Neuro: Alert, answering all questions appropriately. Cranial nerves grossly intact. Skin: No rashes or petechiae noted. Psych: Normal affect. Lymphatics: No cervical, calvicular, axillary or inguinal LAD.   LAB RESULTS:  Appointment on 04/21/2015  Component Date Value Ref Range Status  . WBC 04/21/2015 8.4  3.6 - 11.0 K/uL Final  . RBC 04/21/2015 6.02* 3.80 - 5.20 MIL/uL Final  . Hemoglobin 04/21/2015 11.6* 12.0 - 16.0 g/dL Final   RESULT REPEATED AND VERIFIED  . HCT 04/21/2015 38.2  35.0 - 47.0 % Final   RESULT REPEATED AND VERIFIED  . MCV 04/21/2015 63.4* 80.0 - 100.0 fL Final  . MCH 04/21/2015 19.3* 26.0 - 34.0 pg Final  . MCHC 04/21/2015 30.4* 32.0 - 36.0 g/dL Final  . RDW 04/21/2015 21.3* 11.5 - 14.5 % Final  . Platelets 04/21/2015 407  150 - 440 K/uL Final    STUDIES: No results found.  ASSESSMENT:  Polycythemia vera  PLAN:   1. Polycythemia vera. As previously planned by Dr. Inez Pilgrim goal hematocrit  is less than 42. If hematocrit falls between 42 and 45 250 ML phlebotomy should be performed, if over 45 350 ML phlebotomy should be performed. Also established a target platelet count of less than 500,000. Stated that if platelets continue to rise greater than 500,000 he would consider restarting Hydrea to or 3 times per week. All lab values today within normal limits hematocrit 38.2, platelets 407,000. We will continue with follow-up again in one month to reevaluate lab values.  Patient expressed understanding and was in agreement with this plan. She also understands that She can call clinic at any time with any questions, concerns, or complaints.   Dr. Oliva Bustard was available for consultation and review of plan of care for this patient.   Evlyn Kanner, NP   04/21/2015 10:49 AM

## 2015-05-04 ENCOUNTER — Other Ambulatory Visit: Payer: Self-pay | Admitting: Family Medicine

## 2015-05-17 ENCOUNTER — Inpatient Hospital Stay: Payer: Medicare Other | Attending: Family Medicine

## 2015-05-17 DIAGNOSIS — D45 Polycythemia vera: Secondary | ICD-10-CM | POA: Diagnosis present

## 2015-05-17 LAB — CBC WITH DIFFERENTIAL/PLATELET
Basophils Absolute: 0.1 10*3/uL (ref 0–0.1)
Eosinophils Absolute: 0 10*3/uL (ref 0–0.7)
HCT: 40.4 % (ref 35.0–47.0)
HEMOGLOBIN: 12.3 g/dL (ref 12.0–16.0)
LYMPHS ABS: 1.6 10*3/uL (ref 1.0–3.6)
Lymphocytes Relative: 10 %
MCH: 19.7 pg — ABNORMAL LOW (ref 26.0–34.0)
MCHC: 30.5 g/dL — AB (ref 32.0–36.0)
MCV: 64.7 fL — ABNORMAL LOW (ref 80.0–100.0)
MONO ABS: 0.7 10*3/uL (ref 0.2–0.9)
Monocytes Relative: 4 %
Neutro Abs: 14.3 10*3/uL — ABNORMAL HIGH (ref 1.4–6.5)
Neutrophils Relative %: 85 %
Platelets: 468 10*3/uL — ABNORMAL HIGH (ref 150–440)
RBC: 6.25 MIL/uL — ABNORMAL HIGH (ref 3.80–5.20)
RDW: 22.8 % — ABNORMAL HIGH (ref 11.5–14.5)
WBC: 16.7 10*3/uL — AB (ref 3.6–11.0)

## 2015-05-18 DIAGNOSIS — R1031 Right lower quadrant pain: Secondary | ICD-10-CM | POA: Insufficient documentation

## 2015-05-19 ENCOUNTER — Inpatient Hospital Stay: Payer: Medicare Other

## 2015-06-16 ENCOUNTER — Inpatient Hospital Stay: Payer: Medicare Other | Attending: Family Medicine

## 2015-06-23 ENCOUNTER — Other Ambulatory Visit: Payer: Self-pay | Admitting: Family Medicine

## 2015-07-14 ENCOUNTER — Other Ambulatory Visit: Payer: Medicare Other

## 2015-07-14 ENCOUNTER — Ambulatory Visit: Payer: Medicare Other

## 2015-07-19 ENCOUNTER — Inpatient Hospital Stay: Payer: Medicare Other | Admitting: Family Medicine

## 2015-07-19 ENCOUNTER — Inpatient Hospital Stay: Payer: Medicare Other | Attending: Family Medicine

## 2015-07-19 ENCOUNTER — Other Ambulatory Visit: Payer: Self-pay | Admitting: Family Medicine

## 2015-07-19 DIAGNOSIS — D45 Polycythemia vera: Secondary | ICD-10-CM

## 2015-07-27 ENCOUNTER — Ambulatory Visit: Payer: Medicare Other | Admitting: Anesthesiology

## 2015-07-27 ENCOUNTER — Encounter: Payer: Self-pay | Admitting: *Deleted

## 2015-07-27 ENCOUNTER — Ambulatory Visit
Admission: RE | Admit: 2015-07-27 | Discharge: 2015-07-27 | Disposition: A | Discharge status: Home / Self Care | Payer: Medicare Other | Source: Ambulatory Visit | Attending: Unknown Physician Specialty | Admitting: Unknown Physician Specialty

## 2015-07-27 ENCOUNTER — Encounter
Admission: RE | Disposition: A | Discharge status: Home / Self Care | Payer: Self-pay | Source: Ambulatory Visit | Attending: Unknown Physician Specialty

## 2015-07-27 DIAGNOSIS — R1084 Generalized abdominal pain: Secondary | ICD-10-CM | POA: Insufficient documentation

## 2015-07-27 DIAGNOSIS — Z79899 Other long term (current) drug therapy: Secondary | ICD-10-CM | POA: Diagnosis not present

## 2015-07-27 DIAGNOSIS — F329 Major depressive disorder, single episode, unspecified: Secondary | ICD-10-CM | POA: Insufficient documentation

## 2015-07-27 DIAGNOSIS — F419 Anxiety disorder, unspecified: Secondary | ICD-10-CM | POA: Diagnosis not present

## 2015-07-27 DIAGNOSIS — I129 Hypertensive chronic kidney disease with stage 1 through stage 4 chronic kidney disease, or unspecified chronic kidney disease: Secondary | ICD-10-CM | POA: Diagnosis not present

## 2015-07-27 DIAGNOSIS — E669 Obesity, unspecified: Secondary | ICD-10-CM | POA: Insufficient documentation

## 2015-07-27 DIAGNOSIS — E1140 Type 2 diabetes mellitus with diabetic neuropathy, unspecified: Secondary | ICD-10-CM | POA: Diagnosis not present

## 2015-07-27 DIAGNOSIS — Z6833 Body mass index (BMI) 33.0-33.9, adult: Secondary | ICD-10-CM | POA: Insufficient documentation

## 2015-07-27 DIAGNOSIS — K640 First degree hemorrhoids: Secondary | ICD-10-CM | POA: Diagnosis not present

## 2015-07-27 DIAGNOSIS — E1122 Type 2 diabetes mellitus with diabetic chronic kidney disease: Secondary | ICD-10-CM | POA: Insufficient documentation

## 2015-07-27 DIAGNOSIS — D759 Disease of blood and blood-forming organs, unspecified: Secondary | ICD-10-CM | POA: Diagnosis not present

## 2015-07-27 DIAGNOSIS — N189 Chronic kidney disease, unspecified: Secondary | ICD-10-CM | POA: Insufficient documentation

## 2015-07-27 DIAGNOSIS — D680 Von Willebrand's disease: Secondary | ICD-10-CM | POA: Insufficient documentation

## 2015-07-27 DIAGNOSIS — Z7952 Long term (current) use of systemic steroids: Secondary | ICD-10-CM | POA: Diagnosis not present

## 2015-07-27 DIAGNOSIS — R197 Diarrhea, unspecified: Secondary | ICD-10-CM | POA: Insufficient documentation

## 2015-07-27 DIAGNOSIS — R933 Abnormal findings on diagnostic imaging of other parts of digestive tract: Secondary | ICD-10-CM | POA: Diagnosis present

## 2015-07-27 DIAGNOSIS — D125 Benign neoplasm of sigmoid colon: Secondary | ICD-10-CM | POA: Diagnosis not present

## 2015-07-27 HISTORY — DX: Disease of blood and blood-forming organs, unspecified: D75.9

## 2015-07-27 HISTORY — DX: Type 2 diabetes mellitus without complications: E11.9

## 2015-07-27 HISTORY — DX: Chronic kidney disease, unspecified: N18.9

## 2015-07-27 HISTORY — DX: Major depressive disorder, single episode, unspecified: F32.9

## 2015-07-27 HISTORY — DX: Depression, unspecified: F32.A

## 2015-07-27 HISTORY — DX: Essential (primary) hypertension: I10

## 2015-07-27 HISTORY — DX: Hemorrhagic condition, unspecified: D69.9

## 2015-07-27 HISTORY — DX: Anxiety disorder, unspecified: F41.9

## 2015-07-27 HISTORY — PX: COLONOSCOPY WITH PROPOFOL: SHX5780

## 2015-07-27 LAB — GLUCOSE, CAPILLARY: Glucose-Capillary: 97 mg/dL (ref 65–99)

## 2015-07-27 SURGERY — COLONOSCOPY WITH PROPOFOL
Anesthesia: General

## 2015-07-27 MED ORDER — PROPOFOL 500 MG/50ML IV EMUL
INTRAVENOUS | Status: DC | PRN
  Administered 2015-07-27: 160 ug/kg/min via INTRAVENOUS

## 2015-07-27 MED ORDER — MIDAZOLAM HCL 2 MG/2ML IJ SOLN
INTRAMUSCULAR | Status: DC | PRN
  Administered 2015-07-27: 1 mg via INTRAVENOUS

## 2015-07-27 MED ORDER — LIDOCAINE HCL (CARDIAC) 20 MG/ML IV SOLN
INTRAVENOUS | Status: DC | PRN
  Administered 2015-07-27: 40 mg via INTRAVENOUS

## 2015-07-27 MED ORDER — PROPOFOL 10 MG/ML IV BOLUS
INTRAVENOUS | Status: DC | PRN
  Administered 2015-07-27: 10 mg via INTRAVENOUS
  Administered 2015-07-27: 50 mg via INTRAVENOUS

## 2015-07-27 MED ORDER — SODIUM CHLORIDE 0.9 % IV SOLN
INTRAVENOUS | Status: DC
  Administered 2015-07-27 (×2): 1000 mL via INTRAVENOUS

## 2015-07-27 MED ORDER — SODIUM CHLORIDE 0.9 % IV SOLN: INTRAVENOUS | Status: DC

## 2015-07-27 MED ORDER — DESMOPRESSIN ACETATE 4 MCG/ML IJ SOLN: 24.0000 ug | Freq: Once | INTRAMUSCULAR | Status: DC

## 2015-07-27 MED ORDER — FENTANYL CITRATE (PF) 100 MCG/2ML IJ SOLN
INTRAMUSCULAR | Status: DC | PRN
  Administered 2015-07-27: 50 ug via INTRAVENOUS

## 2015-07-27 MED ORDER — DESMOPRESSIN ACETATE 4 MCG/ML IJ SOLN
24.0000 ug | Freq: Once | INTRAMUSCULAR | Status: AC
  Administered 2015-07-27: 24 ug via INTRAVENOUS
  Filled 2015-07-27: qty 6

## 2015-07-27 NOTE — Progress Notes (Signed)
Spoke with RN taking care of patient in endo to clarify patient weight to verify dose of desmopressin. Per RN, patient weighs 75 kg.

## 2015-07-27 NOTE — H&P (Signed)
Primary Care Physician:  Letta Median, MD Primary Gastroenterologist:  Dr. Vira Agar  Pre-Procedure History & Physical: HPI:  Lauren Escobar is a 49 y.o. female is here for an colonoscopy.   Past Medical History  Diagnosis Date  . Hypertension   . Bleeding disorder   . Blood dyscrasia   . Chronic kidney disease   . Diabetes mellitus without complication   . Anxiety   . Depression     No past surgical history on file.  Prior to Admission medications   Medication Sig Start Date End Date Taking? Authorizing Provider  amphetamine-dextroamphetamine (ADDERALL) 10 MG tablet Take by mouth.   Yes Historical Provider, MD  ARIPiprazole (ABILIFY) 15 MG tablet Take 15 mg by mouth. 12/24/14  Yes Historical Provider, MD  atorvastatin (LIPITOR) 20 MG tablet Take 20 mg by mouth. 09/09/12  Yes Historical Provider, MD  buPROPion (BUDEPRION XL) 300 MG 24 hr tablet Take 300 mg by mouth. 08/03/14  Yes Historical Provider, MD  busPIRone (BUSPAR) 10 MG tablet Take by mouth.   Yes Historical Provider, MD  diazepam (VALIUM) 10 MG tablet Take 10 mg by mouth. 10/07/12  Yes Historical Provider, MD  diphenoxylate-atropine (LOMOTIL) 2.5-0.025 MG per tablet Take by mouth. 07/26/14  Yes Historical Provider, MD  esomeprazole (NEXIUM) 40 MG capsule Take 40 mg by mouth. 08/07/14  Yes Historical Provider, MD  estradiol (ESTRACE VAGINAL) 0.1 MG/GM vaginal cream Use small pea-sized application topically to the urethral meatus each night at bedtime. 11/10/14  Yes Historical Provider, MD  flurbiprofen (ANSAID) 100 MG tablet Take 100 mg by mouth. 07/07/14  Yes Historical Provider, MD  Fluticasone-Salmeterol (ADVAIR DISKUS) 250-50 MCG/DOSE AEPB Inhale into the lungs. 01/02/15  Yes Historical Provider, MD  gabapentin (NEURONTIN) 300 MG capsule Take 600 mg by mouth. 04/20/15 04/19/16 Yes Historical Provider, MD  HYDROcodone-acetaminophen (NORCO) 7.5-325 MG per tablet One tablet by mouth four times daily PRN. Do not fill  before: 04/20/15, 05/20/15 04/20/15  Yes Historical Provider, MD  ipratropium-albuterol (DUONEB) 0.5-2.5 (3) MG/3ML SOLN 3 mL Q4H (RT).  07/11/14  Yes Historical Provider, MD  isosorbide mononitrate (IMDUR) 30 MG 24 hr tablet Take 30 mg by mouth. 07/16/14  Yes Historical Provider, MD  lisdexamfetamine (VYVANSE) 70 MG capsule Take 70 mg by mouth. 01/02/15  Yes Historical Provider, MD  lurasidone (LATUDA) 40 MG TABS tablet  04/18/15  Yes Historical Provider, MD  metFORMIN (GLUCOPHAGE) 500 MG tablet Take 500 mg by mouth. 09/09/12  Yes Historical Provider, MD  mirabegron ER (MYRBETRIQ) 50 MG TB24 tablet Take 50 mg by mouth. 07/28/14  Yes Historical Provider, MD  nitrofurantoin (MACRODANTIN) 100 MG capsule Take by mouth.   Yes Historical Provider, MD  nystatin (MYCOSTATIN) powder Apply topically. 04/05/15 04/04/16 Yes Historical Provider, MD  nystatin cream (MYCOSTATIN) Apply topically. 06/07/13  Yes Historical Provider, MD  propranolol (INDERAL) 40 MG tablet  02/11/15  Yes Historical Provider, MD  sucralfate (CARAFATE) 1 G tablet Take by mouth. 03/14/15 03/13/16 Yes Historical Provider, MD  SUMAtriptan (IMITREX) 50 MG tablet Take 50 mg by mouth. 02/01/13  Yes Historical Provider, MD  tamsulosin (FLOMAX) 0.4 MG CAPS capsule Take 0.4 mg by mouth. 03/25/13  Yes Historical Provider, MD  tiotropium (SPIRIVA HANDIHALER) 18 MCG inhalation capsule Frequency:BID   Dosage:18   MCG  Instructions:  Note:Dose: 18MCG 09/09/12  Yes Historical Provider, MD  topiramate (TOPAMAX) 50 MG tablet Take 50 mg by mouth. 04/20/15  Yes Historical Provider, MD  varenicline (CHANTIX) 1 MG tablet Take 1  mg by mouth. 08/03/14  Yes Historical Provider, MD  Vilazodone HCl (VIIBRYD) 40 MG TABS Take 40 mg by mouth. 02/10/13  Yes Historical Provider, MD  zaleplon (SONATA) 10 MG capsule Take 10 mg by mouth. 12/09/12  Yes Historical Provider, MD    Allergies as of 06/20/2015 - Review Complete 04/21/2015  Allergen Reaction Noted  . Pregabalin Other (See  Comments) 04/21/2015    No family history on file.  Social History   Social History  . Marital Status: Single    Spouse Name: N/A  . Number of Children: N/A  . Years of Education: N/A   Occupational History  . Not on file.   Social History Main Topics  . Smoking status: Not on file  . Smokeless tobacco: Not on file  . Alcohol Use: Not on file  . Drug Use: Not on file  . Sexual Activity: Not on file   Other Topics Concern  . Not on file   Social History Narrative  . No narrative on file    Review of Systems: See HPI, otherwise negative ROS  Physical Exam: BP 114/77 mmHg  Pulse 91  Temp(Src) 98.4 F (36.9 C) (Tympanic)  Resp 16  Ht 4\' 11"  (1.499 m)  Wt 74.844 kg (165 lb)  BMI 33.31 kg/m2 General:   Alert,  pleasant and cooperative in NAD Head:  Normocephalic and atraumatic. Neck:  Supple; no masses or thyromegaly. Lungs:  Clear throughout to auscultation.    Heart:  Regular rate and rhythm. Abdomen:  Soft, nontender and nondistended. Normal bowel sounds, without guarding, and without rebound.   Neurologic:  Alert and  oriented x4;  grossly normal neurologically.  Impression/Plan: Lauren Escobar is here for an colonoscopy to be performed for abnormal CT scan of cecum  Risks, benefits, limitations, and alternatives regarding  colonoscopy have been reviewed with the patient.  Questions have been answered.  All parties agreeable.   Gaylyn Cheers, MD  07/27/2015, 11:12 AM

## 2015-07-27 NOTE — Anesthesia Procedure Notes (Signed)
Date/Time: 07/27/2015 10:45 AM Performed by: Doreen Salvage Pre-anesthesia Checklist: Patient identified, Emergency Drugs available, Suction available and Patient being monitored Patient Re-evaluated:Patient Re-evaluated prior to inductionOxygen Delivery Method: Nasal cannula Intubation Type: IV induction Dental Injury: Teeth and Oropharynx as per pre-operative assessment  Comments: Nasal cannula with etCO2 monitoring

## 2015-07-27 NOTE — Transfer of Care (Signed)
Immediate Anesthesia Transfer of Care Note  Patient: Lauren Escobar  Procedure(s) Performed: Procedure(s): COLONOSCOPY WITH PROPOFOL (N/A)  Patient Location: PACU and Endoscopy Unit  Anesthesia Type:General  Level of Consciousness: sedated  Airway & Oxygen Therapy: Patient Spontanous Breathing and Patient connected to nasal cannula oxygen  Post-op Assessment: Report given to RN and Post -op Vital signs reviewed and stable  Post vital signs: Reviewed and stable  Last Vitals:  Filed Vitals:   07/27/15 1111  BP: 126/78  Pulse:   Temp: 36.2 C  Resp: 18    Complications: No apparent anesthesia complications

## 2015-07-27 NOTE — Anesthesia Preprocedure Evaluation (Signed)
Anesthesia Evaluation  Patient identified by MRN, date of birth, ID band Patient awake    Reviewed: Allergy & Precautions, H&P , NPO status , Patient's Chart, lab work & pertinent test results, reviewed documented beta blocker date and time   Airway Mallampati: II  TM Distance: >3 FB Neck ROM: full    Dental no notable dental hx. (+) Edentulous Upper, Edentulous Lower   Pulmonary neg pulmonary ROS,    Pulmonary exam normal breath sounds clear to auscultation       Cardiovascular Exercise Tolerance: Good hypertension, negative cardio ROS   Rhythm:regular Rate:Normal     Neuro/Psych negative neurological ROS  negative psych ROS   GI/Hepatic negative GI ROS, Neg liver ROS,   Endo/Other  negative endocrine ROSdiabetes  Renal/GU Renal diseasenegative Renal ROS  negative genitourinary   Musculoskeletal   Abdominal   Peds  Hematology negative hematology ROS (+) Blood dyscrasia, ,   Anesthesia Other Findings   Reproductive/Obstetrics negative OB ROS                             Anesthesia Physical Anesthesia Plan  ASA: III  Anesthesia Plan: General   Post-op Pain Management:    Induction:   Airway Management Planned:   Additional Equipment:   Intra-op Plan:   Post-operative Plan:   Informed Consent: I have reviewed the patients History and Physical, chart, labs and discussed the procedure including the risks, benefits and alternatives for the proposed anesthesia with the patient or authorized representative who has indicated his/her understanding and acceptance.   Dental Advisory Given  Plan Discussed with: CRNA  Anesthesia Plan Comments:         Anesthesia Quick Evaluation

## 2015-07-27 NOTE — Op Note (Signed)
[facility] Gastroenterology Patient Name: Lauren Escobar Procedure Date: 07/27/2015 9:58 AM MRN: [id number] Account #: [id number] Date of Birth: 1966/08/24 Admit Type: Outpatient Age: 49 Room: [facility] ENDO ROOM 1 Gender: Female Note Status: Finalized Procedure:         Colonoscopy Indications:       Abnormal CT of the GI tract Providers:         Manya Silvas, MD Referring MD:      Baxter Kail. Bender (Referring MD) Medicines:         Propofol per Anesthesia Complications:     No immediate complications. Procedure:         Pre-Anesthesia Assessment:                    - After reviewing the risks and benefits, the patient was                     deemed in satisfactory condition to undergo the procedure.                    After obtaining informed consent, the colonoscope was                     passed under direct vision. Throughout the procedure, the                     patient's blood pressure, pulse, and oxygen saturations                     were monitored continuously. The Colonoscope was                     introduced through the anus and advanced to the the cecum,                     identified by appendiceal orifice and ileocecal valve. The                     colonoscopy was performed without difficulty. The patient                     tolerated the procedure well. The quality of the bowel                     preparation was adequate to identify polyps 6 mm and                     larger in size. The patient tolerated the procedure well. Findings:      A diminutive polyp was found in the sigmoid colon. The polyp was       sessile. The polyp was removed with a jumbo cold forceps. Resection and       retrieval were complete.      Internal hemorrhoids were found during endoscopy. The hemorrhoids were       small, medium-sized and Grade I (internal hemorrhoids that do not       prolapse).      The exam was otherwise without abnormality. Impression:        -  One diminutive polyp in the sigmoid colon. Resected and                     retrieved.                    -  Internal hemorrhoids.                    - The examination was otherwise normal. Recommendation:    - Await pathology results. Manya Silvas, MD 07/27/2015 11:08:39 AM This report has been signed electronically. Number of Addenda: 0 Note Initiated On: 07/27/2015 9:58 AM Scope Withdrawal Time: 0 hours 8 minutes 1 second  Total Procedure Duration: 0 hours 15 minutes 52 seconds       [facility]

## 2015-07-28 LAB — SURGICAL PATHOLOGY

## 2015-07-31 NOTE — Anesthesia Postprocedure Evaluation (Signed)
  Anesthesia Post-op Note  Patient: Lauren Escobar  Procedure(s) Performed: Procedure(s): COLONOSCOPY WITH PROPOFOL (N/A)  Anesthesia type:General  Patient location: PACU  Post pain: Pain level controlled  Post assessment: Post-op Vital signs reviewed, Patient's Cardiovascular Status Stable, Respiratory Function Stable, Patent Airway and No signs of Nausea or vomiting  Post vital signs: Reviewed and stable  Last Vitals:  Filed Vitals:   07/27/15 1132  BP: 122/77  Pulse: 100  Temp:   Resp: 19    Level of consciousness: awake, alert  and patient cooperative  Complications: No apparent anesthesia complications

## 2015-08-02 ENCOUNTER — Inpatient Hospital Stay: Payer: Medicare Other | Admitting: Family Medicine

## 2015-08-02 ENCOUNTER — Inpatient Hospital Stay: Payer: Medicare Other | Attending: Family Medicine

## 2015-08-09 DIAGNOSIS — G8929 Other chronic pain: Secondary | ICD-10-CM | POA: Insufficient documentation

## 2015-08-09 DIAGNOSIS — M25559 Pain in unspecified hip: Secondary | ICD-10-CM

## 2015-10-11 DIAGNOSIS — Z96659 Presence of unspecified artificial knee joint: Secondary | ICD-10-CM | POA: Insufficient documentation

## 2015-12-12 ENCOUNTER — Encounter: Payer: Self-pay | Admitting: *Deleted

## 2015-12-12 ENCOUNTER — Ambulatory Visit: Payer: Medicare Other | Admitting: Diagnostic Neuroimaging

## 2015-12-13 ENCOUNTER — Encounter: Payer: Self-pay | Admitting: Diagnostic Neuroimaging

## 2015-12-18 ENCOUNTER — Encounter: Payer: Self-pay | Admitting: Diagnostic Neuroimaging

## 2015-12-18 ENCOUNTER — Ambulatory Visit (INDEPENDENT_AMBULATORY_CARE_PROVIDER_SITE_OTHER): Payer: Medicare Other | Admitting: Diagnostic Neuroimaging

## 2015-12-18 VITALS — BP 124/78 | HR 79 | Ht 59.0 in | Wt 178.2 lb

## 2015-12-18 DIAGNOSIS — G43019 Migraine without aura, intractable, without status migrainosus: Secondary | ICD-10-CM

## 2015-12-18 DIAGNOSIS — G894 Chronic pain syndrome: Secondary | ICD-10-CM | POA: Diagnosis not present

## 2015-12-18 NOTE — Progress Notes (Signed)
[facility]  PATIENT: Lauren Escobar DOB: 01/10/1966  REFERRING CLINICIAN: Joselyn Arrow HISTORY FROM: patient  REASON FOR VISIT: new consult    HISTORICAL  CHIEF COMPLAINT:  Chief Complaint  Patient presents with  . Migraine    rm 7, New Patient, "intractable migrianes x 5 years, tried Propanolol"    HISTORY OF PRESENT ILLNESS:   50 year old female here for evaluation of headaches. Patient reports history of headaches since at least 2000, with pounding sensation in the back of her head, nausea, photophobia and phonophobia. She's been under care of neurologist in [facility] for past 3-4 years. She is on topiramate 100 mg twice a day, propanolol 40 motor strength a and sumatriptan 50 mg as needed. Patient continues to have constant daily low-grade headaches in addition to daily exacerbations lasting 2-3 hours a time.  Patient is also tried Tylenol, ibuprofen, BC powder, magnesium. She has had second opinion at [place] [facility] in the past.  Patient was screened for sleep apnea and she does endorse snoring and interrupted sleep. She has never had a sleep study.  Patient drinks 2 Mountain Dew sodas per day.  Patient has chronic fatigue.    REVIEW OF SYSTEMS: Full 14 system review of systems performed and notable only for eye itching ear pain shortness of breath runny nose leg swelling.   ALLERGIES: Allergies  Allergen Reactions  . Pregabalin Other (See Comments)    Unsteady gate, causing falls  . Tramadol Rash    HOME MEDICATIONS: Outpatient Prescriptions Prior to Visit  Medication Sig Dispense Refill  . amphetamine-dextroamphetamine (ADDERALL) 10 MG tablet Take by mouth.    . ARIPiprazole (ABILIFY) 15 MG tablet Take 15 mg by mouth.    Marland Kitchen atorvastatin (LIPITOR) 20 MG tablet Take 20 mg by mouth.    Marland Kitchen buPROPion (BUDEPRION XL) 300 MG 24 hr tablet Take 300 mg by mouth.    . busPIRone (BUSPAR) 10 MG tablet Take by mouth.    .  diazepam (VALIUM) 10 MG tablet Take 10 mg by mouth.    . diphenoxylate-atropine (LOMOTIL) 2.5-0.025 MG per tablet Take by mouth.    . esomeprazole (NEXIUM) 40 MG capsule Take 40 mg by mouth.    . estradiol (ESTRACE VAGINAL) 0.1 MG/GM vaginal cream Use small pea-sized application topically to the urethral meatus each night at bedtime.    . flurbiprofen (ANSAID) 100 MG tablet Take 100 mg by mouth.    . Fluticasone-Salmeterol (ADVAIR DISKUS) 250-50 MCG/DOSE AEPB Inhale into the lungs.    . gabapentin (NEURONTIN) 300 MG capsule Take 600 mg by mouth.    Marland Kitchen ipratropium-albuterol (DUONEB) 0.5-2.5 (3) MG/3ML SOLN 3 mL Q4H (RT).     . isosorbide mononitrate (IMDUR) 30 MG 24 hr tablet Take 30 mg by mouth.    . lisdexamfetamine (VYVANSE) 70 MG capsule Take 70 mg by mouth.    . lurasidone (LATUDA) 40 MG TABS tablet     . metFORMIN (GLUCOPHAGE) 500 MG tablet Take 500 mg by mouth.    . mirabegron ER (MYRBETRIQ) 50 MG TB24 tablet Take 50 mg by mouth.    . nitrofurantoin (MACRODANTIN) 100 MG capsule Take by mouth.    . nystatin cream (MYCOSTATIN) Apply topically.    . propranolol (INDERAL) 40 MG tablet     . sucralfate (CARAFATE) 1 G tablet Take by mouth.    . SUMAtriptan (IMITREX) 50 MG tablet Take 50 mg by mouth.    . tamsulosin (FLOMAX) 0.4 MG CAPS capsule  Take 0.4 mg by mouth.    . tiotropium (SPIRIVA HANDIHALER) 18 MCG inhalation capsule Frequency:BID   Dosage:18   MCG  Instructions:  Note:Dose: 18MCG    . topiramate (TOPAMAX) 50 MG tablet Take 50 mg by mouth.    . Vilazodone HCl (VIIBRYD) 40 MG TABS Take 40 mg by mouth.    . zaleplon (SONATA) 10 MG capsule Take 10 mg by mouth.    Marland Kitchen HYDROcodone-acetaminophen (NORCO) 7.5-325 MG per tablet One tablet by mouth four times daily PRN. Do not fill before: 04/20/15, 05/20/15    . nystatin (MYCOSTATIN) powder Apply topically.    . varenicline (CHANTIX) 1 MG tablet Take 1 mg by mouth. Reported on 12/18/2015     No facility-administered medications prior to  visit.    PAST MEDICAL HISTORY: Past Medical History  Diagnosis Date  . Hypertension   . Bleeding disorder ([place])   . Blood dyscrasia     polycythemia vera  . Chronic kidney disease   . Diabetes mellitus without complication ([place])   . Anxiety   . Depression   . Headache 2000  . Horizontal nystagmus     age 58  . Von Willebrand's disease [facility])     PAST SURGICAL HISTORY: Past Surgical History  Procedure Laterality Date  . Colonoscopy with propofol N/A 07/27/2015    Procedure: COLONOSCOPY WITH PROPOFOL;  Surgeon: Manya Silvas, MD;  Location: [facility] ENDOSCOPY;  Service: Endoscopy;  Laterality: N/A;  . Abdominal hysterectomy  2002    oophorectomy  . Appendectomy    . Bladder surgery    . Replacement total knee Left   . Shoulder surgery Left     x 2  . Cardiac catheterization      FAMILY HISTORY: Family History  Problem Relation Age of Onset  . Cancer Father     prostate  . Stroke Father   . Diabetes Father   . Cancer Mother     bladder    SOCIAL HISTORY:  Social History   Social History  . Marital Status: Single    Spouse Name: N/A  . Number of Children: N/A  . Years of Education: N/A   Occupational History  .      disabled   Social History Main Topics  . Smoking status: Former Smoker    Quit date: 04/18/2014  . Smokeless tobacco: Never Used  . Alcohol Use: No  . Drug Use: No  . Sexual Activity: Not on file   Other Topics Concern  . Not on file   Social History Narrative   single     PHYSICAL EXAM  GENERAL EXAM/CONSTITUTIONAL: Vitals:  Filed Vitals:   12/18/15 0859  BP: 124/78  Pulse: 79  Height: 4\' 11"  (1.499 m)  Weight: 178 lb 3.2 oz (80.831 kg)     Body mass index is 35.97 kg/(m^2).  Visual Acuity Screening   Right eye Left eye Both eyes  Without correction:     With correction: 20/70 20/70   Comments: bifocals    Patient is in no distress; well developed, nourished and groomed; neck is supple  APPEARS SLEEPY; SLOW  SPEECH  CARDIOVASCULAR:  Examination of carotid arteries is normal; no carotid bruits  Regular rate and rhythm, no murmurs  Examination of peripheral vascular system by observation and palpation is normal  EYES:  Ophthalmoscopic exam of optic discs and posterior segments is normal; no papilledema or hemorrhages  MUSCULOSKELETAL:  Gait, strength, tone, movements noted in Neurologic exam below  NEUROLOGIC:  MENTAL STATUS:  No flowsheet data found.  awake, alert, oriented to person, place and time  recent and remote memory intact  normal attention and concentration  language fluent, comprehension intact, naming intact,   fund of knowledge appropriate  CRANIAL NERVE:   2nd - no papilledema on fundoscopic exam  2nd, 3rd, 4th, 6th - pupils equal and reactive to light, visual fields full to confrontation, extraocular muscles intact, NYSTAGMUS ON RIGHT AND LEFT LATERAL GAZE  5th - facial sensation symmetric  7th - facial strength symmetric  8th - hearing intact  9th - palate elevates symmetrically, uvula midline  11th - shoulder shrug symmetric  12th - tongue protrusion midline  MOTOR:   normal bulk and tone, LIMITED STRENGTH DUE TO PAIN in the BUE, BLE  SENSORY:   normal and symmetric to light touch, temperature, vibration  COORDINATION:   finger-nose-finger, fine finger movements SLOW  REFLEXES:   deep tendon reflexes TRACE and symmetric  GAIT/STATION:   narrow based gait; USES ROLLATOR WALKER     DIAGNOSTIC DATA (LABS, IMAGING, TESTING) - I reviewed patient records, labs, notes, testing and imaging myself where available.  Lab Results  Component Value Date   WBC 16.7* 05/17/2015   HGB 12.3 05/17/2015   HCT 40.4 05/17/2015   MCV 64.7* 05/17/2015   PLT 468* 05/17/2015      Component Value Date/Time   NA 138 07/08/2014 1055   K 4.7 07/08/2014 1055   CL 103 07/08/2014 1055   CO2 28 07/08/2014 1055   GLUCOSE 121* 07/08/2014 1055   BUN 7  07/08/2014 1055   CREATININE 0.72 01/30/2015 1512   CALCIUM 8.5 07/08/2014 1055   PROT 6.7 12/13/2013 1202   ALBUMIN 3.4 12/13/2013 1202   AST 38* 12/13/2013 1202   ALT 23 12/13/2013 1202   ALKPHOS 79 12/13/2013 1202   BILITOT 0.6 12/13/2013 1202   GFRNONAA >60 01/30/2015 1512   GFRNONAA >60 01/14/2012 1532   GFRAA >60 01/30/2015 1512   GFRAA >60 01/14/2012 1532   Lab Results  Component Value Date   CHOL 109 01/01/2014   HDL 34* 01/01/2014   LDLCALC 40 01/01/2014   TRIG 175 01/01/2014   Lab Results  Component Value Date   HGBA1C 5.5 01/01/2014   No results found for: VITAMINB12 Lab Results  Component Value Date   TSH 1.33 01/01/2014      ASSESSMENT AND PLAN  50 y.o. year old female here with intractable migraine since 2000. Patient has tried a variety of preventative and rescue medications. She has seen [place] [facility], outside neurologist, and now seeing me for third opinion. I suggested checking sleep study, possible MRI of the brain, lumbar puncture testing but patient declined at this time. Patient currently under pain management treatment for knee, neck and back pain. Patient states that opiate medications seem to help her headaches. At this time I have no further recommendations to add beyond what her primary neurologist has offered.   Dx:    1. Intractable migraine without aura and without status migrainosus      PLAN: - continue topiramate 100mg  BID and propranolol 40mg  BID - continue sumatriptan 50mg  prn migraine - consider sleep study, MRI brain, lumbar puncture; patient declines at this time - continue pain management clinic treatments for knee/neck/back pain - follow up with primary neurologist or consider referral to academic/university headache clinic  Return for return to PCP and primary neurologist.    Penni Bombard, MD [id number], [id number] AM Certified in Neurology,  Neurophysiology and Neuroimaging  [facility] [address], [place], [place] [address] [phone]

## 2015-12-19 IMAGING — CT NM OCTREOSCAN MULTI DAY
1 series · 3 of 3 positions shown, 7 images · non-contrast
Comparison: None.

RADIOPHARMACEUTICALS:  [id number] Octreotide

CLINICAL DATA: Diarrhea, evaluate for  Vipoma

EXAM:
NUCLEAR MEDICINE OCTREOTIDE (SOMATOSTATIN-RECEPTOR) SCAN
TECHNIQUE: Following intravenous administration of radiopharmaceutical, whole
body images of the head, neck, trunk, and extremities were obtained
on subsequent days.

[Series 3: 3d octreo 2.5 b31s · axial · 0.98mm/px · z∈[+1739,+1747]mm · 3 of 3 slices shown, 7 images]
[im 1/3  soft-tissue]
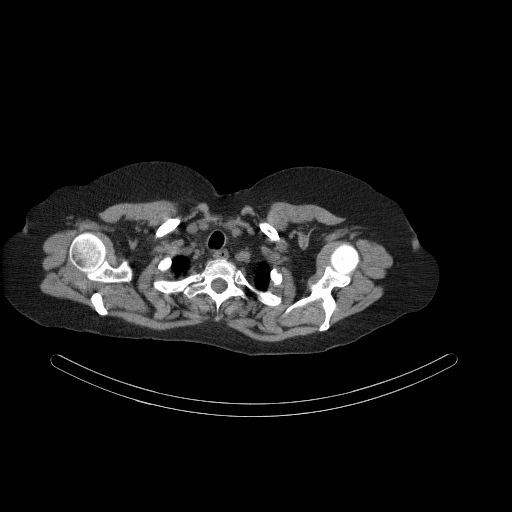
[im 1/3  lung]
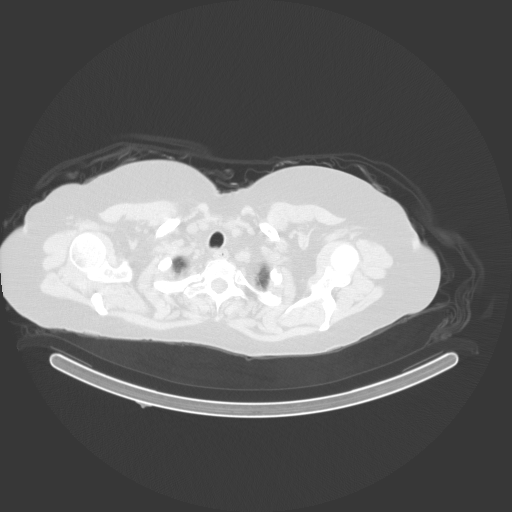
[im 1/3  bone]
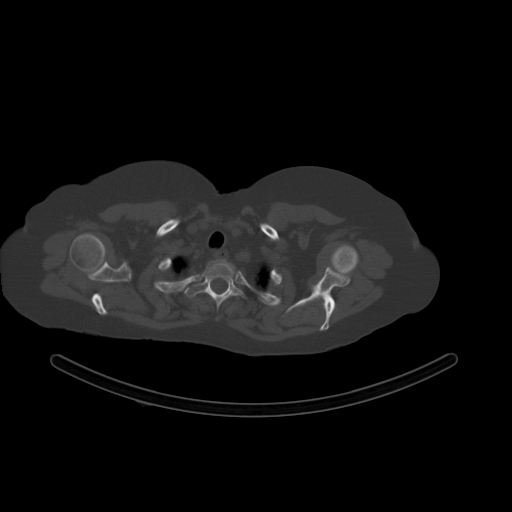
[im 2/3  soft-tissue]
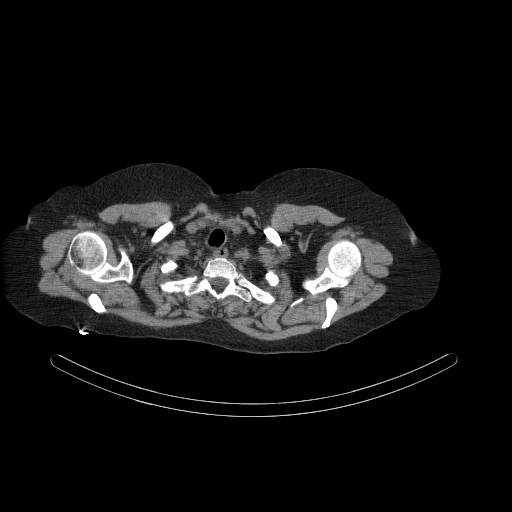
[im 2/3  lung]
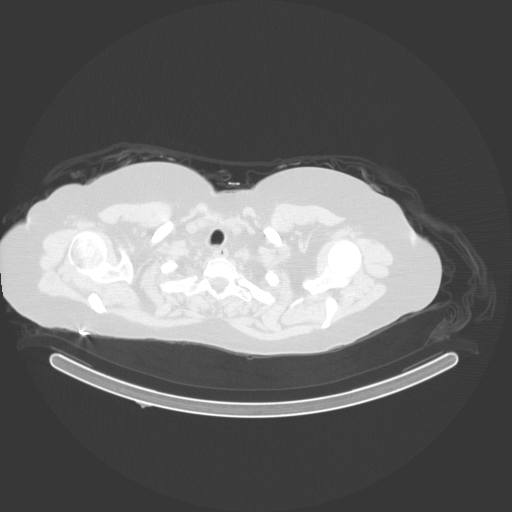
[im 3/3  soft-tissue]
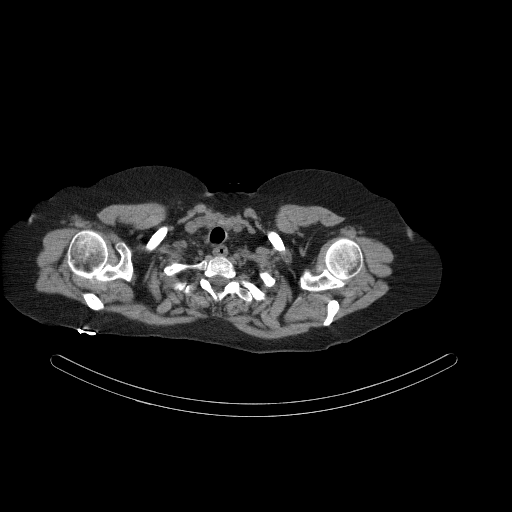
[im 3/3  lung]
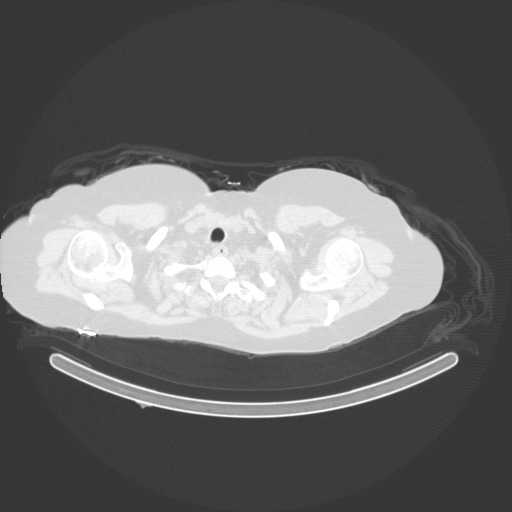

[3 of 3 positions shown; findings below may reference images not displayed]

FINDINGS: Whole-body planar imaging demonstrates no abnormal accumulation of
radiotracer to suggest neuroendocrine tumor. SPECT CT imaging of the
chest abdomen pelvis fail to localize a neuroendocrine tumor. Uptake
in the thyroid gland is likely related to thyroid goiter.
IMPRESSION: No neuroendocrine tumor localized by whole-body planar imaging or
SPECT CT imaging of the chest, abdomen, and pelvis

## 2015-12-27 DIAGNOSIS — R2689 Other abnormalities of gait and mobility: Secondary | ICD-10-CM | POA: Insufficient documentation

## 2016-01-26 ENCOUNTER — Ambulatory Visit: Payer: Medicare Other | Attending: Specialist

## 2016-01-29 ENCOUNTER — Other Ambulatory Visit: Payer: Self-pay | Admitting: Family Medicine

## 2016-01-29 DIAGNOSIS — Z1231 Encounter for screening mammogram for malignant neoplasm of breast: Secondary | ICD-10-CM

## 2016-01-30 ENCOUNTER — Ambulatory Visit: Payer: Medicare Other

## 2016-02-01 ENCOUNTER — Ambulatory Visit
Admission: RE | Admit: 2016-02-01 | Discharge: 2016-02-01 | Disposition: A | Discharge status: Home / Self Care | Payer: Medicare Other | Source: Ambulatory Visit | Attending: Family Medicine | Admitting: Family Medicine

## 2016-02-01 DIAGNOSIS — Z1231 Encounter for screening mammogram for malignant neoplasm of breast: Secondary | ICD-10-CM | POA: Diagnosis not present

## 2016-02-21 ENCOUNTER — Ambulatory Visit: Payer: Medicare Other | Attending: Specialist

## 2016-02-21 DIAGNOSIS — R531 Weakness: Secondary | ICD-10-CM | POA: Diagnosis present

## 2016-02-21 DIAGNOSIS — R0683 Snoring: Secondary | ICD-10-CM | POA: Insufficient documentation

## 2016-02-21 DIAGNOSIS — G4733 Obstructive sleep apnea (adult) (pediatric): Secondary | ICD-10-CM | POA: Diagnosis not present

## 2016-07-12 DIAGNOSIS — I200 Unstable angina: Secondary | ICD-10-CM | POA: Diagnosis present

## 2016-07-17 ENCOUNTER — Ambulatory Visit
Admission: RE | Admit: 2016-07-17 | Discharge: 2016-07-17 | Disposition: A | Discharge status: Home / Self Care | Payer: Medicare Other | Source: Ambulatory Visit | Attending: Internal Medicine | Admitting: Internal Medicine

## 2016-07-17 ENCOUNTER — Encounter
Admission: RE | Disposition: A | Discharge status: Home / Self Care | Payer: Self-pay | Source: Ambulatory Visit | Attending: Internal Medicine

## 2016-07-17 ENCOUNTER — Encounter: Payer: Self-pay | Admitting: *Deleted

## 2016-07-17 DIAGNOSIS — Z823 Family history of stroke: Secondary | ICD-10-CM | POA: Insufficient documentation

## 2016-07-17 DIAGNOSIS — D45 Polycythemia vera: Secondary | ICD-10-CM | POA: Diagnosis not present

## 2016-07-17 DIAGNOSIS — K219 Gastro-esophageal reflux disease without esophagitis: Secondary | ICD-10-CM | POA: Diagnosis not present

## 2016-07-17 DIAGNOSIS — Z888 Allergy status to other drugs, medicaments and biological substances status: Secondary | ICD-10-CM | POA: Diagnosis not present

## 2016-07-17 DIAGNOSIS — E785 Hyperlipidemia, unspecified: Secondary | ICD-10-CM | POA: Insufficient documentation

## 2016-07-17 DIAGNOSIS — Z90710 Acquired absence of both cervix and uterus: Secondary | ICD-10-CM | POA: Diagnosis not present

## 2016-07-17 DIAGNOSIS — F329 Major depressive disorder, single episode, unspecified: Secondary | ICD-10-CM | POA: Diagnosis not present

## 2016-07-17 DIAGNOSIS — Z96659 Presence of unspecified artificial knee joint: Secondary | ICD-10-CM | POA: Diagnosis not present

## 2016-07-17 DIAGNOSIS — Z8042 Family history of malignant neoplasm of prostate: Secondary | ICD-10-CM | POA: Insufficient documentation

## 2016-07-17 DIAGNOSIS — Z833 Family history of diabetes mellitus: Secondary | ICD-10-CM | POA: Diagnosis not present

## 2016-07-17 DIAGNOSIS — G629 Polyneuropathy, unspecified: Secondary | ICD-10-CM | POA: Insufficient documentation

## 2016-07-17 DIAGNOSIS — Z8371 Family history of colonic polyps: Secondary | ICD-10-CM | POA: Diagnosis not present

## 2016-07-17 DIAGNOSIS — Z7984 Long term (current) use of oral hypoglycemic drugs: Secondary | ICD-10-CM | POA: Diagnosis not present

## 2016-07-17 DIAGNOSIS — I200 Unstable angina: Secondary | ICD-10-CM | POA: Diagnosis present

## 2016-07-17 DIAGNOSIS — Z87891 Personal history of nicotine dependence: Secondary | ICD-10-CM | POA: Diagnosis not present

## 2016-07-17 DIAGNOSIS — J449 Chronic obstructive pulmonary disease, unspecified: Secondary | ICD-10-CM | POA: Insufficient documentation

## 2016-07-17 DIAGNOSIS — E7800 Pure hypercholesterolemia, unspecified: Secondary | ICD-10-CM | POA: Insufficient documentation

## 2016-07-17 DIAGNOSIS — I10 Essential (primary) hypertension: Secondary | ICD-10-CM | POA: Diagnosis not present

## 2016-07-17 DIAGNOSIS — Z8249 Family history of ischemic heart disease and other diseases of the circulatory system: Secondary | ICD-10-CM | POA: Insufficient documentation

## 2016-07-17 DIAGNOSIS — R079 Chest pain, unspecified: Secondary | ICD-10-CM | POA: Diagnosis not present

## 2016-07-17 DIAGNOSIS — Z79899 Other long term (current) drug therapy: Secondary | ICD-10-CM | POA: Diagnosis not present

## 2016-07-17 DIAGNOSIS — Z801 Family history of malignant neoplasm of trachea, bronchus and lung: Secondary | ICD-10-CM | POA: Insufficient documentation

## 2016-07-17 DIAGNOSIS — Z809 Family history of malignant neoplasm, unspecified: Secondary | ICD-10-CM | POA: Insufficient documentation

## 2016-07-17 HISTORY — PX: CARDIAC CATHETERIZATION: SHX172

## 2016-07-17 SURGERY — LEFT HEART CATH AND CORONARY ANGIOGRAPHY
Anesthesia: Moderate Sedation

## 2016-07-17 MED ORDER — SODIUM CHLORIDE 0.9% FLUSH: 3.0000 mL | Freq: Two times a day (BID) | INTRAVENOUS | Status: DC

## 2016-07-17 MED ORDER — SODIUM CHLORIDE 0.9 % IV SOLN: 250.0000 mL | INTRAVENOUS | Status: DC | PRN

## 2016-07-17 MED ORDER — IOPAMIDOL (ISOVUE-300) INJECTION 61%
INTRAVENOUS | Status: DC | PRN
  Administered 2016-07-17: 110 mL via INTRAVENOUS

## 2016-07-17 MED ORDER — SODIUM CHLORIDE 0.9 % WEIGHT BASED INFUSION
240.0000 mL/h | INTRAVENOUS | Status: DC
  Administered 2016-07-17: 11:00:00 via INTRAVENOUS

## 2016-07-17 MED ORDER — FENTANYL CITRATE (PF) 100 MCG/2ML IJ SOLN
INTRAMUSCULAR | Status: AC
  Filled 2016-07-17: qty 2

## 2016-07-17 MED ORDER — SODIUM CHLORIDE 0.9 % WEIGHT BASED INFUSION: 3.0000 mL/kg/h | INTRAVENOUS | Status: DC

## 2016-07-17 MED ORDER — ACETAMINOPHEN 325 MG PO TABS
ORAL_TABLET | ORAL | Status: AC
  Filled 2016-07-17: qty 2

## 2016-07-17 MED ORDER — MIDAZOLAM HCL 2 MG/2ML IJ SOLN
INTRAMUSCULAR | Status: AC
  Filled 2016-07-17: qty 2

## 2016-07-17 MED ORDER — MIDAZOLAM HCL 2 MG/2ML IJ SOLN
INTRAMUSCULAR | Status: DC | PRN
  Administered 2016-07-17: 1 mg via INTRAVENOUS

## 2016-07-17 MED ORDER — ACETAMINOPHEN 325 MG PO TABS
650.0000 mg | ORAL_TABLET | ORAL | Status: DC | PRN
  Administered 2016-07-17: 650 mg via ORAL

## 2016-07-17 MED ORDER — SODIUM CHLORIDE 0.9% FLUSH: 3.0000 mL | INTRAVENOUS | Status: DC | PRN

## 2016-07-17 MED ORDER — ASPIRIN 81 MG PO CHEW: 81.0000 mg | CHEWABLE_TABLET | ORAL | Status: DC

## 2016-07-17 MED ORDER — SODIUM CHLORIDE 0.9 % WEIGHT BASED INFUSION: 1.0000 mL/kg/h | INTRAVENOUS | Status: DC

## 2016-07-17 MED ORDER — FENTANYL CITRATE (PF) 100 MCG/2ML IJ SOLN
INTRAMUSCULAR | Status: DC | PRN
  Administered 2016-07-17: 50 ug via INTRAVENOUS
  Administered 2016-07-17: 25 ug via INTRAVENOUS

## 2016-07-17 MED ORDER — ONDANSETRON HCL 4 MG/2ML IJ SOLN: 4.0000 mg | Freq: Four times a day (QID) | INTRAMUSCULAR | Status: DC | PRN

## 2016-07-17 MED ORDER — HEPARIN (PORCINE) IN NACL 2-0.9 UNIT/ML-% IJ SOLN
INTRAMUSCULAR | Status: AC
  Filled 2016-07-17: qty 500

## 2016-07-17 SURGICAL SUPPLY — 9 items
CATH 5FR JL4 DIAGNOSTIC (CATHETERS) ×3 IMPLANT
CATH INFINITI 5FR ANG PIGTAIL (CATHETERS) ×3 IMPLANT
CATH INFINITI JR4 5F (CATHETERS) ×3 IMPLANT
DEVICE CLOSURE MYNXGRIP 5F (Vascular Products) ×3 IMPLANT
KIT MANI 3VAL PERCEP (MISCELLANEOUS) ×3 IMPLANT
NEEDLE PERC 18GX7CM (NEEDLE) ×3 IMPLANT
PACK CARDIAC CATH (CUSTOM PROCEDURE TRAY) ×3 IMPLANT
SHEATH PINNACLE 5F 10CM (SHEATH) ×3 IMPLANT
WIRE EMERALD 3MM-J .035X150CM (WIRE) ×3 IMPLANT

## 2016-07-17 NOTE — Discharge Instructions (Signed)

## 2016-07-17 NOTE — Progress Notes (Signed)
Pt clinically stable post heart cath with clean coronaries. Husband present, Dr Nehemiah Massed out to speak with patient and husband with questions answered, right groin without bleeding nor hematoma. Denies complaints, taking po's without difficulty.

## 2016-07-18 ENCOUNTER — Encounter: Payer: Self-pay | Admitting: Internal Medicine

## 2016-07-23 ENCOUNTER — Other Ambulatory Visit: Payer: Self-pay | Admitting: Orthopedic Surgery

## 2016-07-23 DIAGNOSIS — M7541 Impingement syndrome of right shoulder: Secondary | ICD-10-CM

## 2016-07-26 ENCOUNTER — Ambulatory Visit: Admission: RE | Admit: 2016-07-26 | Payer: Medicare Other | Source: Ambulatory Visit

## 2016-07-26 ENCOUNTER — Other Ambulatory Visit: Payer: Self-pay | Admitting: Orthopedic Surgery

## 2016-07-26 DIAGNOSIS — M7541 Impingement syndrome of right shoulder: Secondary | ICD-10-CM

## 2016-07-31 DIAGNOSIS — R079 Chest pain, unspecified: Secondary | ICD-10-CM | POA: Insufficient documentation

## 2016-07-31 DIAGNOSIS — Z9889 Other specified postprocedural states: Secondary | ICD-10-CM | POA: Insufficient documentation

## 2016-08-07 ENCOUNTER — Ambulatory Visit
Admission: RE | Admit: 2016-08-07 | Discharge: 2016-08-07 | Disposition: A | Discharge status: Home / Self Care | Payer: Medicare Other | Source: Ambulatory Visit | Attending: Orthopedic Surgery | Admitting: Orthopedic Surgery

## 2016-08-07 DIAGNOSIS — M7541 Impingement syndrome of right shoulder: Secondary | ICD-10-CM

## 2016-08-07 DIAGNOSIS — M75101 Unspecified rotator cuff tear or rupture of right shoulder, not specified as traumatic: Secondary | ICD-10-CM | POA: Insufficient documentation

## 2016-08-07 MED ORDER — IOPAMIDOL (ISOVUE-300) INJECTION 61%
10.0000 mL | Freq: Once | INTRAVENOUS | Status: AC | PRN
  Administered 2016-08-07: 4 mL via INTRA_ARTERIAL

## 2016-08-07 MED ORDER — LIDOCAINE HCL (PF) 1 % IJ SOLN
5.0000 mL | Freq: Once | INTRAMUSCULAR | Status: AC
  Administered 2016-08-07: 5 mL
  Filled 2016-08-07: qty 5

## 2016-08-07 MED ORDER — SODIUM CHLORIDE 0.9 % IJ SOLN
5.0000 mL | INTRAMUSCULAR | Status: DC | PRN
  Administered 2016-08-07: 5 mL

## 2016-09-27 DIAGNOSIS — N390 Urinary tract infection, site not specified: Secondary | ICD-10-CM | POA: Insufficient documentation

## 2016-10-01 DIAGNOSIS — D414 Neoplasm of uncertain behavior of bladder: Secondary | ICD-10-CM | POA: Insufficient documentation

## 2016-11-21 ENCOUNTER — Other Ambulatory Visit: Payer: Self-pay | Admitting: Internal Medicine

## 2016-11-21 DIAGNOSIS — R197 Diarrhea, unspecified: Secondary | ICD-10-CM

## 2016-11-21 DIAGNOSIS — R1013 Epigastric pain: Secondary | ICD-10-CM | POA: Insufficient documentation

## 2016-11-21 DIAGNOSIS — R300 Dysuria: Secondary | ICD-10-CM | POA: Insufficient documentation

## 2016-11-22 ENCOUNTER — Other Ambulatory Visit
Admission: RE | Admit: 2016-11-22 | Discharge: 2016-11-22 | Disposition: A | Discharge status: Home / Self Care | Payer: Medicare Other | Source: Ambulatory Visit | Attending: Nurse Practitioner | Admitting: Nurse Practitioner

## 2016-11-22 DIAGNOSIS — R197 Diarrhea, unspecified: Secondary | ICD-10-CM | POA: Diagnosis present

## 2016-11-22 LAB — GASTROINTESTINAL PANEL BY PCR, STOOL (REPLACES STOOL CULTURE)

## 2016-11-22 LAB — C DIFFICILE QUICK SCREEN W PCR REFLEX
C Diff antigen: POSITIVE — AB
C Diff toxin: NEGATIVE

## 2016-11-22 LAB — CLOSTRIDIUM DIFFICILE BY PCR: Toxigenic C. Difficile by PCR: NEGATIVE

## 2016-11-27 ENCOUNTER — Ambulatory Visit
Admission: RE | Admit: 2016-11-27 | Discharge: 2016-11-27 | Disposition: A | Discharge status: Home / Self Care | Payer: Medicare Other | Source: Ambulatory Visit | Attending: Internal Medicine | Admitting: Internal Medicine

## 2016-11-27 ENCOUNTER — Other Ambulatory Visit: Payer: Self-pay | Admitting: Nurse Practitioner

## 2016-11-27 DIAGNOSIS — R197 Diarrhea, unspecified: Secondary | ICD-10-CM

## 2016-11-27 DIAGNOSIS — R911 Solitary pulmonary nodule: Secondary | ICD-10-CM | POA: Insufficient documentation

## 2016-11-27 DIAGNOSIS — R1013 Epigastric pain: Secondary | ICD-10-CM | POA: Diagnosis not present

## 2016-11-27 DIAGNOSIS — K760 Fatty (change of) liver, not elsewhere classified: Secondary | ICD-10-CM | POA: Insufficient documentation

## 2016-11-27 HISTORY — DX: Unspecified asthma, uncomplicated: J45.909

## 2016-11-27 LAB — POCT I-STAT CREATININE: Creatinine, Ser: 0.7 mg/dL (ref 0.44–1.00)

## 2016-11-27 MED ORDER — IOPAMIDOL (ISOVUE-300) INJECTION 61%
100.0000 mL | Freq: Once | INTRAVENOUS | Status: AC | PRN
  Administered 2016-11-27: 100 mL via INTRAVENOUS

## 2016-11-29 ENCOUNTER — Ambulatory Visit: Payer: Medicare Other

## 2016-12-19 ENCOUNTER — Other Ambulatory Visit: Payer: Self-pay | Admitting: Nurse Practitioner

## 2016-12-19 DIAGNOSIS — R6881 Early satiety: Secondary | ICD-10-CM | POA: Insufficient documentation

## 2016-12-26 ENCOUNTER — Ambulatory Visit
Admission: RE | Admit: 2016-12-26 | Discharge: 2016-12-26 | Disposition: A | Discharge status: Home / Self Care | Payer: Medicare Other | Source: Ambulatory Visit | Attending: Nurse Practitioner | Admitting: Nurse Practitioner

## 2016-12-26 DIAGNOSIS — R6881 Early satiety: Secondary | ICD-10-CM | POA: Insufficient documentation

## 2016-12-26 MED ORDER — TECHNETIUM TC 99M SULFUR COLLOID
2.0000 | Freq: Once | INTRAVENOUS | Status: AC | PRN
  Administered 2016-12-26: 2.14 via INTRAVENOUS

## 2017-01-21 DIAGNOSIS — N320 Bladder-neck obstruction: Secondary | ICD-10-CM | POA: Insufficient documentation

## 2017-01-27 ENCOUNTER — Ambulatory Visit: Payer: Medicare Other | Attending: Neurology

## 2017-01-27 DIAGNOSIS — M6281 Muscle weakness (generalized): Secondary | ICD-10-CM | POA: Insufficient documentation

## 2017-01-27 DIAGNOSIS — R262 Difficulty in walking, not elsewhere classified: Secondary | ICD-10-CM | POA: Diagnosis present

## 2017-01-27 DIAGNOSIS — G8929 Other chronic pain: Secondary | ICD-10-CM | POA: Diagnosis present

## 2017-01-27 DIAGNOSIS — M25562 Pain in left knee: Secondary | ICD-10-CM | POA: Insufficient documentation

## 2017-01-27 NOTE — Addendum Note (Signed)
Addended by: Blain Pais on: 01/27/2017 02:55 PM   Modules accepted: Orders

## 2017-01-27 NOTE — Therapy (Signed)
[place] MAIN [facility] SERVICES [address] [place], [place], [address] Phone: [phone]   Fax:  [phone]  Physical Therapy Evaluation  Patient Details  Name: Lauren Escobar MRN: [id number] Date of Birth: 1966/06/29 Referring Provider: Dr. Manuella Ghazi  Encounter Date: 01/27/2017      PT End of Session - 01/27/17 1007    Visit Number 1   Number of Visits 12   Date for PT Re-Evaluation 03/10/17   PT Start Time 0900   PT Stop Time 1000   PT Time Calculation (min) 60 min   Equipment Utilized During Treatment Gait belt   Activity Tolerance Patient tolerated treatment well;Patient limited by pain   Behavior During Therapy [facility] for tasks assessed/performed      Past Medical History:  Diagnosis Date  . Anxiety   . Asthma   . Bleeding disorder ([place])   . Blood dyscrasia    polycythemia vera  . Chronic kidney disease   . Depression   . Diabetes mellitus without complication ([place])   . Headache 2000  . Horizontal nystagmus    age 32  . Hypertension   . Von Willebrand's disease [facility])     Past Surgical History:  Procedure Laterality Date  . ABDOMINAL HYSTERECTOMY  2002   oophorectomy  . APPENDECTOMY    . BLADDER SURGERY    . BREAST BIOPSY Left    NEG  . CARDIAC CATHETERIZATION    . CARDIAC CATHETERIZATION N/A 07/17/2016   Procedure: Left Heart Cath and Coronary Angiography;  Surgeon: Corey Skains, MD;  Location: [place] CV LAB;  Service: Cardiovascular;  Laterality: N/A;  . COLONOSCOPY WITH PROPOFOL N/A 07/27/2015   Procedure: COLONOSCOPY WITH PROPOFOL;  Surgeon: Manya Silvas, MD;  Location: [facility] ENDOSCOPY;  Service: Endoscopy;  Laterality: N/A;  . REPLACEMENT TOTAL KNEE Left   . SHOULDER SURGERY Left    x 2    There were no vitals filed for this visit.       Subjective Assessment - 01/27/17 0915    Subjective Pt is a 51 yo presenting with imbalance and LE weakness. Patient states difficulty walking longer periods  of time and standing for prolonged periods of time, performing the stairs ascesion/desecion of the stairs, performing heavy household chores, and performing dishes, and transfer in and out of the tub and the car. (boyfriend helps perform).    Pertinent History Patient reports L knee fx and subsquent replacement ~ 10 years ago. Patient hx of DM, vonn-wellburn dz, polyliocethemia, endometriosis, plantarfasciaitis, rotator cuff tears B,    How long can you stand comfortably? 48min   How long can you walk comfortably? 83min   Patient Stated Goals Improve strength  and independence    Currently in Pain? Yes   Pain Score 9    Pain Location --  L knee and L arm   Pain Orientation Left   Pain Descriptors / Indicators Aching;Throbbing   Pain Type Chronic pain   Pain Onset More than a month ago   Pain Frequency Constant   Aggravating Factors  movement of limbs   Pain Relieving Factors nothing            [facility] PT Assessment - 01/27/17 0908      Assessment   Medical Diagnosis Imbalance    Referring Provider Dr. Manuella Ghazi   Onset Date/Surgical Date 08/28/16   Hand Dominance Left   Next MD Visit unknown   Prior Therapy yes     Precautions  Precautions Fall     Balance Screen   Has the patient fallen in the past 6 months Yes   How many times? 75   Has the patient had a decrease in activity level because of a fear of falling?  Yes   Is the patient reluctant to leave their home because of a fear of falling?  Yes     Prior Function   Level of Independence Needs assistance with ADLs   Vocation On disability   Vocation Requirements N/A   Leisure watching TV     Cognition   Overall Cognitive Status Within Functional Limits for tasks assessed     Sensation   Light Touch Impaired Detail  L LE higher sensitive to light touch L2-S2     ROM / Strength   AROM / PROM / Strength AROM;Strength     AROM   AROM Assessment Site Knee   Right/Left Knee Right;Left   Right Knee Extension -5   Right  Knee Flexion 110   Left Knee Extension -20   Left Knee Flexion 90     Strength   Strength Assessment Site Hip;Knee;Ankle   Right/Left Hip Right;Left   Right Hip Flexion 3+/5   Right Hip ABduction 3/5   Right Hip ADduction 3/5   Left Hip Flexion 3-/5   Left Hip ABduction 3-/5   Left Hip ADduction 3-/5   Right/Left Knee Right;Left   Right Knee Flexion 4-/5   Right Knee Extension 4-/5   Left Knee Flexion 3+/5   Left Knee Extension 3-/5   Right/Left Ankle Right;Left   Right Ankle Dorsiflexion 4/5   Right Ankle Plantar Flexion 3+/5   Right Ankle Inversion 3+/5   Right Ankle Eversion 3+/5   Left Ankle Dorsiflexion 3-/5   Left Ankle Plantar Flexion 2+/5   Left Ankle Inversion 3-/5   Left Ankle Eversion 3-/5     Ambulation/Gait   Assistive device Rollator   Gait Pattern Decreased arm swing - right;Decreased arm swing - left;Step-to pattern;Decreased step length - right;Decreased step length - left;Decreased stride length;Decreased hip/knee flexion - left;Decreased weight shift to right   Gait velocity .85m/s   Gait Comments Poor posture increased hip and lumbar flexion with exercise     Balance   Balance Assessed Yes     Static Standing Balance   Static Standing - Balance Support No upper extremity supported   Static Standing - Level of Assistance 4: Min assist   Static Standing Balance -  Activities  Romberg - Eyes Opened;Sharpened Romberg - Eyes Open  Unable to balance for >10 without assistance   Static Standing - Comment/# of Minutes 10sec     Standardized Balance Assessment   Standardized Balance Assessment Timed Up and Go Test;10 meter walk test;Five Times Sit to Stand   Five times sit to stand comments  20sec with use of UEs   10 Meter Walk .41m/s     Timed Up and Go Test   Normal TUG (seconds) 36     Observation: Sensation: hyper sensitivity to L knee with eliciting of pain after light fingertip touch. Balance: Patient unable to perform higher level balancing  and demonstrates hip strategies to maintain balance.   Treatment: Sit to stands -- x 10  Feet together balancing -- x 30sec Knee ball roll outs to decrease pain -- x 10        PT Education - 01/27/17 1006    Education provided Yes   Education Details HEP: performing light touch  over the L knee, sit to stands x 10, LAQ to L knee throughout pain-free ROM   Person(s) Educated Patient   Methods Explanation;Demonstration   Comprehension Verbalized understanding;Returned demonstration             PT Long Term Goals - 02-20-2017 1015      PT LONG TERM GOAL #1   Title Patient will score <16 sec to perform 5xSTS without use of UE's to decrease fall risk and improve LE strength.   Baseline 5Xsts: 20sec with use of UEs   Time 6   Period Weeks   Status New     PT LONG TERM GOAL #2   Title Patient will score >30m/s with performing 30mWT with rollator to indicate decreased fall risk when ambulating at community distances.   Baseline .65m/s   Time 6   Period Weeks   Status New     PT LONG TERM GOAL #3   Title Patient will score under 10sec on the TUG to demonstrate significant improvement on the TUG and demonstrate decreased fall risk.    Baseline TUG: 36 sec   Time 6   Period Weeks   Status New     PT LONG TERM GOAL #4   Title Patient will be independent with HEP to demonstrate continued improvements after D/C from PT   Baseline Dependent with HEP performance    Time 6   Period Weeks   Status New               Plan - 02/20/2017 1008    Clinical Impression Statement Pt is a 51 yo let hand dominant female presenting with decreased balance and LE strength. Balance and strength limitations are indicated by decreased time to perform 5xSTS, 55mwt, TUG, Sharpened Rhomberg balance, and decreased AROM/MMT testing. Patient demonstrates increased sensitivity to light touch over the L knee and is unable to move the L knee without significant increase in pain. Patient demonstrates  significant decrease in balance and standing/walking tolerance. Patient will benefit from further skilled therapy to return to prior level of function.    Rehab Potential Fair   Clinical Impairments Affecting Rehab Potential (-) Chronicity of pain and condition, (+) family support   PT Frequency 2x / week   PT Duration 6 weeks   PT Treatment/Interventions ADLs/Self Care Home Management;Electrical Stimulation;Iontophoresis 4mg /ml Dexamethasone;Moist Heat;Stair training;Gait training;Therapeutic activities;Therapeutic exercise;Functional mobility training;Balance training;Patient/family education;Neuromuscular re-education;Manual techniques;Passive range of motion;Energy conservation   PT Next Visit Plan Progress balance/ strengthening   PT Home Exercise Plan sit to stands, knee AROM, tactile light touching to L knee   Consulted and Agree with Plan of Care Patient      Patient will benefit from skilled therapeutic intervention in order to improve the following deficits and impairments:  Pain, Decreased range of motion, Decreased strength, Decreased endurance, Decreased activity tolerance, Difficulty walking, Decreased balance, Decreased safety awareness, Impaired flexibility, Hypomobility, Impaired UE functional use, Decreased mobility, Decreased coordination, Increased muscle spasms, Impaired tone, Abnormal gait, Impaired sensation  Visit Diagnosis: Muscle weakness (generalized)  Difficulty in walking, not elsewhere classified  Chronic pain of left knee      G-Codes - 2017-02-20 1137    Functional Assessment Tool Used (Outpatient Only) TUG, 5xSTS, 63mWT, MMT, clinical judgement   Functional Limitation Mobility: Walking and moving around   Mobility: Walking and Moving Around Current Status (U6333) At least 40 percent but less than 60 percent impaired, limited or restricted   Mobility: Walking and Moving Around Goal Status [phone])  At least 1 percent but less than 20 percent impaired, limited or  restricted       Problem List Patient Active Problem List   Diagnosis Date Noted  . Unstable angina ([place]) 07/12/2016  . Polycythemia vera ([place]) 04/20/2015    Blythe Stanford, PT DPT 01/27/2017, 11:38 AM  [place] MAIN [facility] SERVICES [address] [place], [place], [address] Phone: [phone]   Fax:  [phone]  Name: Lauren Escobar MRN: [id number] Date of Birth: [birth year]

## 2017-02-03 ENCOUNTER — Ambulatory Visit: Payer: Medicare Other

## 2017-02-05 DIAGNOSIS — R339 Retention of urine, unspecified: Secondary | ICD-10-CM | POA: Insufficient documentation

## 2017-02-13 ENCOUNTER — Ambulatory Visit: Payer: Medicare Other

## 2017-02-17 ENCOUNTER — Ambulatory Visit: Payer: Medicare Other

## 2017-02-19 ENCOUNTER — Ambulatory Visit: Payer: Medicare Other

## 2017-02-24 ENCOUNTER — Ambulatory Visit: Payer: Medicare Other

## 2017-02-26 ENCOUNTER — Ambulatory Visit: Payer: Medicare Other

## 2017-03-03 ENCOUNTER — Ambulatory Visit: Payer: Medicare Other

## 2017-03-05 ENCOUNTER — Ambulatory Visit: Payer: Medicare Other

## 2017-03-10 ENCOUNTER — Ambulatory Visit: Payer: Medicare Other

## 2017-03-12 ENCOUNTER — Ambulatory Visit: Payer: Medicare Other

## 2017-03-26 ENCOUNTER — Emergency Department: Payer: Medicare Other

## 2017-03-26 ENCOUNTER — Encounter: Payer: Self-pay | Admitting: Emergency Medicine

## 2017-03-26 ENCOUNTER — Emergency Department
Admission: EM | Admit: 2017-03-26 | Discharge: 2017-03-26 | Disposition: A | Discharge status: Home / Self Care | Payer: Medicare Other | Attending: Emergency Medicine | Admitting: Emergency Medicine

## 2017-03-26 DIAGNOSIS — I129 Hypertensive chronic kidney disease with stage 1 through stage 4 chronic kidney disease, or unspecified chronic kidney disease: Secondary | ICD-10-CM | POA: Diagnosis not present

## 2017-03-26 DIAGNOSIS — R0789 Other chest pain: Secondary | ICD-10-CM | POA: Diagnosis not present

## 2017-03-26 DIAGNOSIS — J45909 Unspecified asthma, uncomplicated: Secondary | ICD-10-CM | POA: Insufficient documentation

## 2017-03-26 DIAGNOSIS — Z79899 Other long term (current) drug therapy: Secondary | ICD-10-CM | POA: Insufficient documentation

## 2017-03-26 DIAGNOSIS — Z87891 Personal history of nicotine dependence: Secondary | ICD-10-CM | POA: Insufficient documentation

## 2017-03-26 DIAGNOSIS — Z7984 Long term (current) use of oral hypoglycemic drugs: Secondary | ICD-10-CM | POA: Diagnosis not present

## 2017-03-26 DIAGNOSIS — N189 Chronic kidney disease, unspecified: Secondary | ICD-10-CM | POA: Diagnosis not present

## 2017-03-26 DIAGNOSIS — E1122 Type 2 diabetes mellitus with diabetic chronic kidney disease: Secondary | ICD-10-CM | POA: Diagnosis not present

## 2017-03-26 DIAGNOSIS — R079 Chest pain, unspecified: Secondary | ICD-10-CM

## 2017-03-26 LAB — BASIC METABOLIC PANEL
ANION GAP: 6 (ref 5–15)
BUN: 12 mg/dL (ref 6–20)
CHLORIDE: 108 mmol/L (ref 101–111)
CO2: 25 mmol/L (ref 22–32)
CREATININE: 0.83 mg/dL (ref 0.44–1.00)
Calcium: 9.3 mg/dL (ref 8.9–10.3)
GFR calc Af Amer: >60 mL/min (ref >60)
GFR calc non Af Amer: >60 mL/min (ref >60)
Glucose, Bld: 109 mg/dL — ABNORMAL HIGH (ref 65–99)
Potassium: 4 mmol/L (ref 3.5–5.1)
SODIUM: 139 mmol/L (ref 135–145)

## 2017-03-26 LAB — CBC
HEMATOCRIT: 38.2 % (ref 35.0–47.0)
HEMOGLOBIN: 12 g/dL (ref 12.0–16.0)
MCH: 21.5 pg — ABNORMAL LOW (ref 26.0–34.0)
MCHC: 31.3 g/dL — ABNORMAL LOW (ref 32.0–36.0)
MCV: 68.7 fL — AB (ref 80.0–100.0)
PLATELETS: 400 10*3/uL (ref 150–440)
RBC: 5.56 MIL/uL — AB (ref 3.80–5.20)
RDW: 18.4 % — ABNORMAL HIGH (ref 11.5–14.5)
WBC: 10 10*3/uL (ref 3.6–11.0)

## 2017-03-26 LAB — TROPONIN I
Troponin I: <0.03 ng/mL (ref <0.03)
Troponin I: <0.03 ng/mL

## 2017-03-26 MED ORDER — KETOROLAC TROMETHAMINE 30 MG/ML IJ SOLN
30.0000 mg | Freq: Once | INTRAMUSCULAR | Status: AC
  Administered 2017-03-26: 30 mg via INTRAVENOUS
  Filled 2017-03-26: qty 1

## 2017-03-26 MED ORDER — DIAZEPAM 5 MG PO TABS: 5.0000 mg | ORAL_TABLET | Freq: Three times a day (TID) | ORAL | 0 refills | Status: DC | PRN

## 2017-03-26 MED ORDER — IBUPROFEN 600 MG PO TABS: 600.0000 mg | ORAL_TABLET | Freq: Three times a day (TID) | ORAL | 0 refills | Status: DC | PRN

## 2017-03-26 NOTE — ED Triage Notes (Signed)
Patient presents to the ED with chest pain x 1 week.  Patient had an appt. With Dr. Nehemiah Massed this am but when she went to appt.  Dr. Nehemiah Massed brought patient to ED.  Patient states pain has been constant in left side of chest and left shoulder.  Patient states chest area is tender and pain is worse with movement.

## 2017-03-26 NOTE — ED Provider Notes (Signed)
[facility] Emergency Department Provider Note       Time seen: ----------------------------------------- 11:13 AM on 03/26/2017 -----------------------------------------     I have reviewed the triage vital signs and the nursing notes.   HISTORY   Chief Complaint Chest Pain    HPI Lauren Escobar is a 51 y.o. female who presents to the ED for chest pain for last week. Patient upon with Dr. Narda Bonds Q this morning but when she went to the appointment he brought her to the ER. Patient states the pain has been constant in the left side of her chest and shoulder. Patient states chest area is tender the pain is worse with movement.   Past Medical History:  Diagnosis Date  . Anxiety   . Asthma   . Bleeding disorder ([place])   . Blood dyscrasia    polycythemia vera  . Chronic kidney disease   . Depression   . Diabetes mellitus without complication ([place])   . Headache 2000  . Horizontal nystagmus    age 62  . Hypertension   . Von Willebrand's disease [facility])     Patient Active Problem List   Diagnosis Date Noted  . Unstable angina ([place]) 07/12/2016  . Polycythemia vera ([place]) 04/20/2015    Past Surgical History:  Procedure Laterality Date  . ABDOMINAL HYSTERECTOMY  2002   oophorectomy  . ABDOMINAL HYSTERECTOMY    . APPENDECTOMY    . BLADDER SURGERY    . BREAST BIOPSY Left    NEG  . CARDIAC CATHETERIZATION    . CARDIAC CATHETERIZATION N/A 07/17/2016   Procedure: Left Heart Cath and Coronary Angiography;  Surgeon: Corey Skains, MD;  Location: [place] CV LAB;  Service: Cardiovascular;  Laterality: N/A;  . COLONOSCOPY WITH PROPOFOL N/A 07/27/2015   Procedure: COLONOSCOPY WITH PROPOFOL;  Surgeon: Manya Silvas, MD;  Location: [facility] ENDOSCOPY;  Service: Endoscopy;  Laterality: N/A;  . REPLACEMENT TOTAL KNEE Left   . SHOULDER SURGERY Left    x 2    Allergies Pregabalin and Tramadol  Social History Social History  Substance Use Topics   . Smoking status: Former Smoker    Quit date: 04/18/2014  . Smokeless tobacco: Never Used  . Alcohol use No    Review of Systems Constitutional: Negative for fever. Eyes: Negative for vision changes ENT:  Negative for congestion, sore throat Cardiovascular: Positive for chest pain Respiratory: Negative for shortness of breath. Gastrointestinal: Negative for abdominal pain, vomiting and diarrhea. Genitourinary: Negative for dysuria. Musculoskeletal: Negative for back pain. Skin: Negative for rash. Neurological: Negative for headaches, focal weakness or numbness.  All systems negative/normal/unremarkable except as stated in the HPI  ____________________________________________   PHYSICAL EXAM:  VITAL SIGNS: ED Triage Vitals  Enc Vitals Group     BP 03/26/17 0957 127/83     Pulse Rate 03/26/17 0957 82     Resp 03/26/17 0957 18     Temp 03/26/17 0957 98.2 F (36.8 C)     Temp Source 03/26/17 0957 Oral     SpO2 03/26/17 0957 96 %     Weight 03/26/17 0958 172 lb (78 kg)     Height 03/26/17 0958 4\' 11"  (1.499 m)     Head Circumference --      Peak Flow --      Pain Score 03/26/17 0957 9     Pain Loc --      Pain Edu? --      Excl. in [place]? --  Constitutional: Alert and oriented. Well appearing and in no distress. Eyes: Conjunctivae are normal. Normal extraocular movements. ENT   Head: Normocephalic and atraumatic.   Nose: No congestion/rhinnorhea.   Mouth/Throat: Mucous membranes are moist.   Neck: No stridor. Cardiovascular: Normal rate, regular rhythm. No murmurs, rubs, or gallops. Respiratory: Normal respiratory effort without tachypnea nor retractions. Breath sounds are clear and equal bilaterally. No wheezes/rales/rhonchi. Gastrointestinal: Soft and nontender. Normal bowel sounds Musculoskeletal: Nontender with normal range of motion in extremities. No lower extremity tenderness nor edema. Neurologic:  Normal speech and language. No gross focal  neurologic deficits are appreciated.  Skin:  Skin is warm, dry and intact. No rash noted. Psychiatric: Mood and affect are normal. Speech and behavior are normal.  ____________________________________________  EKG: Interpreted by me.Sinus rhythm rate 81 bpm, normal PR interval, normal QRS, normal QT.  ____________________________________________  ED COURSE:  Pertinent labs & imaging results that were available during my care of the patient were reviewed by me and considered in my medical decision making (see chart for details). Patient presents for chest pain, we will assess with labs and imaging as indicated.   Procedures ____________________________________________   LABS (pertinent positives/negatives)  Labs Reviewed  BASIC METABOLIC PANEL - Abnormal; Notable for the following:       Result Value   Glucose, Bld 109 (*)    All other components within normal limits  CBC - Abnormal; Notable for the following:    RBC 5.56 (*)    MCV 68.7 (*)    MCH 21.5 (*)    MCHC 31.3 (*)    RDW 18.4 (*)    All other components within normal limits  TROPONIN I  TROPONIN I    RADIOLOGY  Chest x-ray Is negative ____________________________________________  FINAL ASSESSMENT AND PLAN  Chest pain  Plan: Patient's labs and imaging were dictated above. Patient had presented for Chest pain which appears to be reproducible and musculoskeletal related. She'll be discharged with anti-inflammatory muscle relaxants. She is stable for outpatient follow-up. I did discuss with her cardiologist who agrees with her planned prior to discharge.   [name], MD   Note: This note was generated in part or whole with voice recognition software. Voice recognition is usually quite accurate but there are transcription errors that can and very often do occur. I apologize for any typographical errors that were not detected and corrected.     [name], MD 03/26/17 [phone]

## 2017-04-15 DIAGNOSIS — J449 Chronic obstructive pulmonary disease, unspecified: Secondary | ICD-10-CM | POA: Insufficient documentation

## 2017-04-15 DIAGNOSIS — K219 Gastro-esophageal reflux disease without esophagitis: Secondary | ICD-10-CM | POA: Insufficient documentation

## 2017-04-15 DIAGNOSIS — F1190 Opioid use, unspecified, uncomplicated: Secondary | ICD-10-CM | POA: Insufficient documentation

## 2017-04-15 DIAGNOSIS — F329 Major depressive disorder, single episode, unspecified: Secondary | ICD-10-CM | POA: Insufficient documentation

## 2017-04-15 DIAGNOSIS — G43909 Migraine, unspecified, not intractable, without status migrainosus: Secondary | ICD-10-CM | POA: Insufficient documentation

## 2017-04-15 DIAGNOSIS — G894 Chronic pain syndrome: Secondary | ICD-10-CM | POA: Insufficient documentation

## 2017-04-15 DIAGNOSIS — F32A Depression, unspecified: Secondary | ICD-10-CM | POA: Insufficient documentation

## 2017-04-15 DIAGNOSIS — Z79891 Long term (current) use of opiate analgesic: Secondary | ICD-10-CM | POA: Insufficient documentation

## 2017-04-15 NOTE — Progress Notes (Signed)
Patient's Name: Lauren Escobar  MRN: [id number]  Referring Provider: Letta Median, MD  DOB: 03-09-1966  PCP: Letta Median, MD  DOS: 04/16/2017  Note by: Dionisio David NP  Service setting: Ambulatory outpatient  Specialty: Interventional Pain Management  Location: [facility] (AMB) Pain Management Facility    Patient type: New Patient    Primary Reason(s) for Visit: Initial Patient Evaluation CC: Knee Pain (left)  HPI  Lauren Escobar is a 51 y.o. year old, female patient, who comes today for an initial evaluation. She has Polycythemia vera ([place]); Unstable angina ([place]); Abdominal pain, acute, right lower quadrant; Epigastric pain; Asthma without status asthmaticus; Atony of bladder; Balance problem; Benign essential hypertension; Bladder outlet obstruction; Candidiasis of female genitalia; Chest pain; Chest pain, unspecified; Chronic cough; Chronic cystitis; Chronic daily headache; Chronic hip pain (bilateral) (left greater than right); Chronic interstitial cystitis; Congenital nystagmus; COPD (chronic obstructive pulmonary disease) ([place]); Depression; Detrusor instability; Diabetic peripheral neuropathy ([place]); Diarrhea, unspecified; Dyspareunia; Dysuria; Early satiety; Female stress incontinence; Functional disorder of bladder; GERD (gastroesophageal reflux disease); Gross hematuria; Headache disorder; Incomplete emptying of bladder; Mechanical breakdown of implanted electronic neurostimulator of peripheral nerve ([place]); Migraines; Mixed hyperlipidemia; Mixed urge and stress incontinence; Bladder neoplasm of uncertain malignant potential; Nystagmus, congenital; Pain medication agreement signed; S/P cardiac cath; Type I diabetes mellitus ([place]); Urge incontinence; Increased frequency of urination; Urinary incontinence without sensory awareness; Urinary retention; Urinary tract infection; Von Willebrand's disease ([place]); Chronic pain syndrome; Long term current use of opiate analgesic; Long term  prescription opiate use; Opiate use; Pain in joint, shoulder region (secondary) (right greater than left); Knee pain (primary) (greater than right); and Pain due to interstitial cystitis (tertiary) on her problem list.. Her primarily concern today is the Knee Pain (left)  Pain Assessment: Self-Reported Pain Score: 9 /10 Clinically the patient looks like a 3/10 Reported level is inconsistent with clinical observations. Information on the proper use of the pain scale provided to the patient today Pain Type: Chronic pain (post fx 10 years ago) Pain Location: Knee Pain Orientation: Left Pain Descriptors / Indicators: Dull, Aching Pain Frequency: Constant  Onset and Duration: Present longer than 3 months Cause of pain: Trauma Severity: NAS-11 at its worse: 9/10, NAS-11 at its best: 8/10, NAS-11 now: 9/10 and NAS-11 on the average: 5/10 Timing: Afternoon Aggravating Factors: Bending, Kneeling, Lifiting and Walking Alleviating Factors: Stretching Associated Problems: Day-time cramps, Night-time cramps, Dizziness, Inability to control bladder (urine), Inability to control bowel and Sweating Quality of Pain: Aching, Dull, Itching and Stabbing Previous Examinations or Tests: CT scan Previous Treatments: Narcotic medications and Physical Therapy  The patient comes into the clinics today for the first time for a chronic pain management evaluation. According to the patient her pain is worse in her knee. She admits the left is greater than the right. She feels like this is related to a fall that she suffered 10 years ago. She fell off a ladder and fractured her left leg in 5 places. She did undergo surgery including a total knee replacement. She admits that she needs a revision however is not a surgical candidate at this time. She was told she has nerve damage. She does have numbness tingling and weakness. She does have frequent falls last fall was approximately 2 weeks ago. She has had interventional  therapies last done approximately one year ago. She has had physical therapy phase last one was ineffective they will concerned that her knee was getting worse. She denies any recent images.  Her  next area of pain is in her shoulders. She admits right is greater than the left. She has a torn rotator cuff. Again she is not a candidate for surgery. She does have numbness, tingling and weakness. She has had interventional treatment as approximate 4 months ago Dr. Sabra Heck which were ineffective. She has also had physical therapy which was ineffective.  She admits that her next area of pain is in her bladder secondary to having interstitial cystitis. She's been followed by Dr. Jacqlyn Larsen. He does interventional therapies. She denies any physical therapy.  Her last area of pain is in her hip she admits that the left is greater than the right. She has had interventional therapy approximately 1 year ago at [facility]. She does not feel that it was effective it may have made things even worse. She denies any physical therapy or recent images.  Today I took the time to provide the patient with information regarding this pain practice. The patient was informed that the practice is divided into two sections: an interventional pain management section, as well as a completely separate and distinct medication management section. I explained that there are procedure days for interventional therapies, and evaluation days for follow-ups and medication management. Because of the amount of documentation required during both, they are kept separated. This means that there is the possibility that she may be scheduled for a procedure on one day, and medication management the next. I have also informed her that because of staffing and facility limitations, this practice will no longer take patients for medication management only. To illustrate the reasons for this, I gave the patient the example of surgeons, and how inappropriate it would be to  refer a patient to her care, just to write for the post-surgical antibiotics on a surgery done by a different surgeon.   Because interventional pain management is part of the board-certified specialty for the doctors, the patient was informed that joining this practice means that they are open to any and all interventional therapies. I made it clear that this does not mean that they will be forced to have any procedures done. What this means is that I believe interventional therapies to be essential part of the diagnosis and proper management of chronic pain conditions. Therefore, patients not interested in these interventional alternatives will be better served under the care of a different practitioner.  The patient was also made aware of my Comprehensive Pain Management Safety Guidelines where by joining this practice, they limit all of their nerve blocks and joint injections to those done by our practice, for as long as we are retained to manage their care. Historic Controlled Substance Pharmacotherapy Review  PMP and historical list of controlled substances: Diazepam 5 mg 3 times daily, Butrans 10 g patch weekly, Butrans 7.5 g patch weekly, Vyvanse 70 mg capsule daily, Butrans 5 g patch weekly, acetaminophen/codeine No. 3 3 times daily, Vyvanse 60 mg capsule daily, Vyvanse 50 mg capsule daily, Cheratussin 120 mL twice daily, hydrocodone/acetaminophen 7.5/325mg, Adderall ER 30 mg capsules daily,  Adderall 20 mg capsules twice daily, Zaleplon 10 mg capsule daily Highest opioid analgesic regimen found: Hydrocodone/acetaminophen 7.5/325 5 times daily Most recent opioid analgesic: Butrans 10 g patch weekly, Current opioid analgesics: Butrans 10 g patch weekly, Highest recorded MME/day: 45 mg/day MME/day: unknown  Medications: The patient did not bring the medication(s) to the appointment, as requested in our "New Patient Package" Pharmacodynamics: Desired effects: Analgesia: The patient reports  >50% benefit. Reported improvement in function:  The patient reports medication allows her to accomplish basic ADLs. Clinically meaningful improvement in function (CMIF): Sustained CMIF goals met Perceived effectiveness: Described as relatively effective, allowing for increase in activities of daily living (ADL) Undesirable effects: Side-effects or Adverse reactions: None reported Historical Monitoring: The patient  reports that she does not use drugs. List of all UDS Test(s): No results found for: MDMA, COCAINSCRNUR, PCPSCRNUR, PCPQUANT, CANNABQUANT, THCU, [place] List of all Serum Drug Screening Test(s):  No results found for: AMPHSCRSER, BARBSCRSER, BENZOSCRSER, COCAINSCRSER, PCPSCRSER, PCPQUANT, THCSCRSER, CANNABQUANT, OPIATESCRSER, OXYSCRSER, PROPOXSCRSER Historical Background Evaluation: [place] PDMP: Six (6) year initial data search conducted.             [place] Department of public safety, offender search: Editor, commissioning Information) Non-contributory Risk Assessment Profile: Aberrant behavior: None observed or detected today Risk factors for fatal opioid overdose: Concomitant use of Benzodiazepines, Age 75-19 years old, Caucasian and Multiple prescribers Fatal overdose hazard ratio (HR): 1.32 for 20-49 MME/day Non-fatal overdose hazard ratio (HR): 1.44 for 20-49 MME/day Risk of opioid abuse or dependence: 0.7-3.0% with doses ? 36 MME/day and 6.1-26% with doses ? 120 MME/day. Substance use disorder (SUD) risk level: Pending results of Medical Psychology Evaluation for SUD Opioid risk tool (ORT) (Total Score): 1  ORT Scoring interpretation table:  Score <3 = Low Risk for SUD  Score between 4-7 = Moderate Risk for SUD  Score >8 = High Risk for Opioid Abuse   PHQ-2 Depression Scale:  Total score: 0  PHQ-2 Scoring interpretation table: (Score and probability of major depressive disorder)  Score 0 = No depression  Score 1 = 15.4% Probability  Score 2 = 21.1% Probability  Score 3 = 38.4% Probability   Score 4 = 45.5% Probability  Score 5 = 56.4% Probability  Score 6 = 78.6% Probability   PHQ-9 Depression Scale:  Total score: 0  PHQ-9 Scoring interpretation table:  Score 0-4 = No depression  Score 5-9 = Mild depression  Score 10-14 = Moderate depression  Score 15-19 = Moderately severe depression  Score 20-27 = Severe depression (2.4 times higher risk of SUD and 2.89 times higher risk of overuse)   Pharmacologic Plan: Pending ordered tests and/or consults  Meds  The patient has a current medication list which includes the following prescription(s): amphetamine-dextroamphetamine, aripiprazole, bupropion, buspirone, diazepam, dicyclomine, diphenoxylate-atropine, esomeprazole, estradiol, flurbiprofen, fluticasone-salmeterol, ipratropium-albuterol, isosorbide mononitrate, lisdexamfetamine, lurasidone, metformin, mirabegron er, nitrofurantoin, nystatin cream, propranolol, sumatriptan, tamsulosin, tiotropium, topiramate, vilazodone hcl, zaleplon, gabapentin, and sucralfate.  Current Outpatient Prescriptions on File Prior to Visit  Medication Sig  . amphetamine-dextroamphetamine (ADDERALL) 10 MG tablet Take by mouth.  . ARIPIPRAZOLE PO Take 15 mg by mouth 2 (two) times daily.   Marland Kitchen buPROPion (BUDEPRION XL) 300 MG 24 hr tablet Take 300 mg by mouth daily.   . busPIRone (BUSPAR) 15 MG tablet Take by mouth 2 (two) times daily.   . diazepam (VALIUM) 10 MG tablet Take 10 mg by mouth 3 (three) times daily.   Marland Kitchen dicyclomine (BENTYL) 10 MG capsule Take 10 mg by mouth daily.  . diphenoxylate-atropine (LOMOTIL) 2.5-0.025 MG per tablet Take 2 tablets by mouth 4 (four) times daily as needed.   Marland Kitchen esomeprazole (NEXIUM) 40 MG capsule Take 40 mg by mouth daily.   Marland Kitchen estradiol (ESTRACE VAGINAL) 0.1 MG/GM vaginal cream Use small pea-sized application topically to the urethral meatus each night at bedtime.  . flurbiprofen (ANSAID) 100 MG tablet Take 100 mg by mouth.  . Fluticasone-Salmeterol (ADVAIR DISKUS)  250-50 MCG/DOSE AEPB Inhale  1 puff into the lungs 2 (two) times daily.   Marland Kitchen ipratropium-albuterol (DUONEB) 0.5-2.5 (3) MG/3ML SOLN 3 mL Q4H (RT).   . isosorbide mononitrate (IMDUR) 30 MG 24 hr tablet Take 30 mg by mouth daily.  Marland Kitchen lisdexamfetamine (VYVANSE) 70 MG capsule Take 70 mg by mouth daily.   Marland Kitchen lurasidone (LATUDA) 40 MG TABS tablet Take by mouth daily with breakfast.   . metFORMIN (GLUCOPHAGE) 500 MG tablet Take 500 mg by mouth daily with breakfast.   . mirabegron ER (MYRBETRIQ) 50 MG TB24 tablet Take 50 mg by mouth daily.   . nitrofurantoin (MACRODANTIN) 100 MG capsule Take 100 mg by mouth daily.   Marland Kitchen nystatin cream (MYCOSTATIN) Apply topically.  . propranolol (INDERAL) 40 MG tablet Take 40 mg by mouth 2 (two) times daily.   . SUMAtriptan (IMITREX) 100 MG tablet Take 100 mg by mouth once.   . tamsulosin (FLOMAX) 0.4 MG CAPS capsule Take 0.4 mg by mouth daily after supper.   . tiotropium (SPIRIVA HANDIHALER) 18 MCG inhalation capsule Frequency:BID   Dosage:18   MCG  Instructions:  Note:Dose: 18MCG  . topiramate (TOPAMAX) 50 MG tablet Take 50 mg by mouth 2 (two) times daily.   . Vilazodone HCl (VIIBRYD) 40 MG TABS Take 40 mg by mouth.  . zaleplon (SONATA) 10 MG capsule Take 10 mg by mouth.  . gabapentin (NEURONTIN) 300 MG capsule Take 900 mg by mouth 3 (three) times daily.   . sucralfate (CARAFATE) 1 G tablet Take 1 g by mouth 4 (four) times daily -  with meals and at bedtime.    No current facility-administered medications on file prior to visit.    Imaging Review  Shoulder Imaging:  Shoulder-R CT w contrast:  Results for orders placed during the hospital encounter of 08/07/16  CT SHOULDER RIGHT W CONTRAST   Narrative CLINICAL DATA:  Right shoulder pain. Unable to lift arm for 3 months.  EXAM: CT ARTHROGRAPHY OF THE RIGHT SHOULDER  TECHNIQUE: Multidetector CT imaging was performed following the standard protocol after injection of dilute contrast into the joint.  COMPARISON:   None.  FINDINGS: Rotator cuff: Very small partial thickness articular surface tear of the supraspinatus tendon. Infraspinatus tendon is intact. Teres minor tendon is intact. Subscapularis tendon is intact.  Muscles: No atrophy or fatty replacement of the muscles of the rotator cuff.  Biceps Long Head: Intraarticular and extraarticular portions of the biceps tendon are intact.  Acromioclavicular Joint: Moderate degenerative changes of the acromioclavicular joint. Type II acromion. No contrast in the subacromial/subdeltoid bursa.  Glenohumeral Joint: Intraarticular contrast mildly distending the joint capsule. No chondral defect.  Labrum: Intact.  Bones: No fracture or dislocation. No lytic or sclerotic osseous lesion. No pes reaction bone destruction.  Other:  Visualize right lung is clear.  IMPRESSION: 1. Very small partial thickness articular surface tear of the supraspinatus tendon.   Electronically Signed   By: Kathreen Devoid   On: 08/07/2016 14:00    Shoulder-R DG Arthrogram:  Results for orders placed during the hospital encounter of 08/07/16  DG Arthro Shoulder Right   Narrative INDICATION: Impingement syndrome with limitation of motion and pain right shoulder  EXAM: INTRA-ARTICULAR CONTRAST ADMINISTRATION RIGHT SHOULDER FOR RIGHT CT SHOULDER ARTHROGRAPHY  COMPARISON:  None.  FLUOROSCOPY TIME:  Fluoroscopy 0 minutes 42 seconds  Radiation Exposure Index (if provided by the fluoroscopic device): 5.50 mGy  Number of Acquired Spot Images: 1  COMPLICATIONS: None immediate.  PROCEDURE: Following informed consent, the patient was placed  on the fluoroscopy table, and the right shoulder joint was localized using fluoroscopy. The right shoulder was subsequently prepped and draped with local anesthesia using 1% lidocaine injected into the tissues over the right shoulder joint. Under fluoroscopic guidance, a 22 gauge spinal needle was inserted into the right  shoulder joint. A total of 4 mL Omnipaque 300 nonionic, 5 mm 1% lidocaine, and 5 mL normal saline was inserted into the right shoulder joint under fluoroscopic guidance. At the end of the procedure, the needle was removed, and the patient was transferred from the fluoroscopy suite to CT in stable condition.  IMPRESSION: Under fluoroscopic guidance, a 22 gauge spinal needle was inserted into the right shoulder joint with contrast injection as noted above. Image confirmation obtained. No immediate complications.   Electronically Signed   By: Lowella Grip III M.D.   On: 08/07/2016 11:32    Shoulder-L DG:  Results for orders placed in visit on 10/04/09  DG Shoulder Left   Narrative  PRIOR REPORT IMPORTED FROM AN EXTERNAL SYSTEM *   PRIOR REPORT IMPORTED FROM THE [facility] WORKFLOW SYSTEM   REASON FOR EXAM:    shoulder   flex 3  COMMENTS:   LMP: Post Hysterectomy   PROCEDURE:     DXR - DXR SHOULDER LEFT COMPLETE  - Oct 04 2009 11:56AM   RESULT:     Three views of the left shoulder reveal the bones to be  adequately mineralized for age. I do not see evidence of an acute  fracture.  There is no dislocation or significant degenerative change. The [facility] joint  is  grossly intact.   IMPRESSION:      I see no acute bony abnormality of the left shoulder. Of  note is a rounded sclerotic focus that projects over the anterior lateral  aspect of the left third rib.       Lumbosacral Imaging:  Lumbar CT wo contrast:  Results for orders placed in visit on 01/28/11  CT Lumbar Spine Wo Contrast   Narrative * PRIOR REPORT IMPORTED FROM AN EXTERNAL SYSTEM *   PRIOR REPORT IMPORTED FROM THE [facility]NGO WORKFLOW SYSTEM   REASON FOR EXAM:    has bladder stimulator  back and right leg pain  COMMENTS:   PROCEDURE:     CT  - CT LUMBAR SPINE WO  - Jan 28 2011  1:33PM   RESULT:     Clinical Indication: Back pain   Comparison: None   Technique: Multiple axial CT images from T12 through S1  obtained with  sagittal and coronal reformatted images provided.   Findings:   The alignment is anatomic. The vertebral body heights are maintained.  There  is no acute fracture or static listhesis. The paravertebral soft tissues  are  normal. The intraspinal soft tissues are not fully imaged on this  examination due to poor soft tissue contrast, but there is no soft tissue  gross abnormality.   The disc spaces are maintained.   IMPRESSION:   1. No acute osseous injury of the lumbar spine.      Knee Imaging:   Knee-L DG 1-2 views:  Results for orders placed in visit on 12/18/07  DG Knee 1-2 Views Left   Narrative * PRIOR REPORT IMPORTED FROM AN EXTERNAL SYSTEM *   PRIOR REPORT IMPORTED FROM THE SYNGO WORKFLOW SYSTEM   REASON FOR EXAM:    TKR  COMMENTS:   Bedside (portable):Y   PROCEDURE:     DXR -  DXR KNEE LEFT AP AND LATERAL  - Dec 18 2007  3:48PM   RESULT:     The patient is status post LEFT knee replacement. No fracture  about the femoral or tibial prosthetic components is seen. There is no  dislocation at the prosthetic knee joint. Surgical drains are present.   IMPRESSION:   The patient is status post LEFT knee replacement. No abnormal  postoperative  changes are identified.   Thank you for the opportunity to contribute to the care of your patient.        Note: Available results from prior imaging studies were reviewed.        ROS  Cardiovascular History: Negative for hypertension, coronary artery diseas, myocardial infraction, anticoagulant therapy or heart failure Pulmonary or Respiratory History: Lung problems, Asthma and Emphysema Neurological History: Negative for epilepsy, stroke, urinary or fecal inontinence, spina bifida or tethered cord syndrome Review of Past Neurological Studies: No results found for this or any previous visit. Psychological-Psychiatric History: Negative for anxiety, depression, schizophrenia, bipolar disorders or suicidal  ideations or attempts Gastrointestinal History: Negative for peptic ulcer disease, hiatal hernia, GERD, IBS, hepatitis, cirrhosis or pancreatitis Genitourinary History: Kidney disease Hematological History: Negative for anticoagulant therapy, anemia, bruising or bleeding easily, hemophilia, sickle cell disease or trait, thrombocytopenia or coagulupathies Endocrine History: Negative for diabetes or thyroid disease Rheumatologic History: Negative for lupus, osteoarthritis, rheumatoid arthritis, myositis, polymyositis or fibromyagia Musculoskeletal History: Negative for myasthenia gravis, muscular dystrophy, multiple sclerosis or malignant hyperthermia Work History: Disabled  Allergies  Ms. Bickle is allergic to pregabalin and tramadol.  Laboratory Chemistry  Inflammation Markers Lab Results  Component Value Date   ESRSEDRATE 2 04/16/2017   (CRP: Acute Phase) (ESR: Chronic Phase) Renal Function Markers Lab Results  Component Value Date   BUN 9 04/16/2017   CREATININE 0.73 04/16/2017   GFRAA >60 04/16/2017   GFRNONAA >60 04/16/2017   Hepatic Function Markers Lab Results  Component Value Date   AST 42 (H) 04/16/2017   ALT 22 04/16/2017   ALBUMIN 3.9 04/16/2017   ALKPHOS 73 04/16/2017   Electrolytes Lab Results  Component Value Date   NA 136 04/16/2017   K 3.5 04/16/2017   CL 105 04/16/2017   CALCIUM 8.7 (L) 04/16/2017   MG 1.9 04/16/2017   Neuropathy Markers No results found for: [id number] Bone Pathology Markers Lab Results  Component Value Date   ALKPHOS 73 04/16/2017   CALCIUM 8.7 (L) 04/16/2017   Coagulation Parameters Lab Results  Component Value Date   INR 1.0 09/18/2012   LABPROT 13.3 09/18/2012   APTT 32.0 11/02/2013   PLT 400 03/26/2017   Cardiovascular Markers Lab Results  Component Value Date   HGB 12.0 03/26/2017   HCT 38.2 03/26/2017   Note: Lab results reviewed.  [place]  Drug: Ms. Castagnola  reports that she does not use drugs. Alcohol:   reports that she does not drink alcohol. Tobacco:  reports that she quit smoking about 2 years ago. She has never used smokeless tobacco. Medical:  has a past medical history of Anxiety; Asthma; Bleeding disorder ([place]); Blood dyscrasia; Chronic kidney disease; Depression; Diabetes mellitus without complication ([place]); Headache (2000); Horizontal nystagmus; Hypertension; and Von Willebrand's disease ([place]). Family: family history includes Breast cancer in her maternal grandmother; Cancer in her father and mother; Diabetes in her father; Stroke in her father.  Past Surgical History:  Procedure Laterality Date  . ABDOMINAL HYSTERECTOMY  2002   oophorectomy  . ABDOMINAL HYSTERECTOMY    .  APPENDECTOMY    . BLADDER SURGERY    . BREAST BIOPSY Left    NEG  . CARDIAC CATHETERIZATION    . CARDIAC CATHETERIZATION N/A 07/17/2016   Procedure: Left Heart Cath and Coronary Angiography;  Surgeon: Corey Skains, MD;  Location: [facility];  Service: Cardiovascular;  Laterality: N/A;  . COLONOSCOPY WITH PROPOFOL N/A 07/27/2015   Procedure: COLONOSCOPY WITH PROPOFOL;  Surgeon: Manya Silvas, MD;  Location[facility];  Service: Endoscopy;  Laterality: N/A;  . REPLACEMENT TOTAL KNEE Left   . SHOULDER SURGERY Left    x 2   Active Ambulatory Problems    Diagnosis Date Noted  . Polycythemia vera ([place]) 04/20/2015  . Unstable angina ([place]) 07/12/2016  . Abdominal pain, acute, right lower quadrant 05/18/2015  . Epigastric pain 11/21/2016  . Asthma without status asthmaticus 07/28/2014  . Atony of bladder 02/18/2013  . Balance problem 12/27/2015  . Benign essential hypertension 03/31/2015  . Bladder outlet obstruction 01/21/2017  . Candidiasis of female genitalia 04/05/2015  . Chest pain 07/15/2014  . Chest pain, unspecified 07/31/2016  . Chronic cough 07/15/2014  . Chronic cystitis 03/25/2013  . Chronic daily headache 07/15/2014  . Chronic hip pain (bilateral) (left greater than right)  08/09/2015  . Chronic interstitial cystitis 07/02/2012  . 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 COPD (chronic obstructive pulmonary disease) ([place]) 04/15/2017  . Depression 04/15/2017  . Detrusor instability 07/28/2014  . Diabetic peripheral neuropathy ([place]) 06/25/2013  . Diarrhea, unspecified 11/21/2016  . Dyspareunia 07/02/2012  . Dysuria 11/21/2016  . Early satiety 12/19/2016  . Female stress incontinence 01/20/2013  . Functional disorder of bladder 01/20/2013  . GERD (gastroesophageal reflux disease) 04/15/2017  . Gross hematuria 07/02/2012  . Headache disorder 07/15/2014  . Incomplete emptying of bladder 07/02/2012  . Mechanical breakdown of implanted electronic neurostimulator of peripheral nerve ([place]) 01/28/2015  . Migraines 04/15/2017  . Mixed hyperlipidemia 08/29/2014  . Mixed urge and stress incontinence 07/02/2012  . 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 Nystagmus, congenital 07/15/2014  . Pain medication agreement signed 04/20/2015  . S/P cardiac cath 07/31/2016  . Type I diabetes mellitus ([place]) 05/23/2014  . Urge incontinence 01/20/2013  . Increased frequency of urination 07/02/2012  . Urinary incontinence without sensory awareness 02/18/2013  . Urinary retention 02/05/2017  . Urinary tract infection 09/27/2016  . Von Willebrand's disease ([place]) 07/02/2012  . Chronic pain syndrome 04/15/2017  . Long term current use of opiate analgesic 04/15/2017  . Long term prescription opiate use 04/15/2017  . Opiate use 04/15/2017  . Pain in joint, shoulder region (secondary) (right greater than left) 04/16/2017  . Knee pain (primary) (greater than right) 04/16/2017  . 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 No Resolved Ambulatory Problems   Past Medical History:  Diagnosis Date  . Anxiety   . Asthma   . Bleeding disorder ([place])   . Blood dyscrasia   . Chronic kidney disease    . Depression   . Diabetes mellitus without complication ([place])   . Headache 2000  . Horizontal nystagmus   . Hypertension   . Von Willebrand's disease [facility])    Constitutional Exam  General appearance: Well nourished, well developed, and well hydrated. In no apparent acute distress Vitals:   04/16/17 0812  BP: 114/61  Pulse: 71  Resp: 16  Temp: 97.9 F (36.6 C)  TempSrc: Oral  SpO2: 98%  Weight:  170 lb (77.1 kg)  Height: _0  (1.499 m)   BMI Assessment: Estimated body mass index is 34.34 kg/m as calculated from the following:   Height as of this encounter: _1  (1.499 m).   Weight as of this encounter: 170 lb (77.1 kg).  BMI interpretation table: BMI level Category Range association with higher incidence of chronic pain  <18 kg/m2 Underweight   18.5-24.9 kg/m2 Ideal body weight   25-29.9 kg/m2 Overweight Increased incidence by 20%  30-34.9 kg/m2 Obese (Class I) Increased incidence by 68%  35-39.9 kg/m2 Severe obesity (Class II) Increased incidence by 136%  >40 kg/m2 Extreme obesity (Class III) Increased incidence by 254%   BMI Readings from Last 4 Encounters:  04/16/17 34.34 kg/m  03/26/17 34.74 kg/m  07/17/16 35.95 kg/m  12/18/15 35.99 kg/m   Wt Readings from Last 4 Encounters:  04/16/17 170 lb (77.1 kg)  03/26/17 172 lb (78 kg)  07/17/16 178 lb (80.7 kg)  12/18/15 178 lb 3.2 oz (80.8 kg)  Psych/Mental status: Alert, oriented x 3 (person, place, & time)       Eyes: PERLA Respiratory: No evidence of acute respiratory distress  Cervical Spine Exam  Inspection: No masses, redness, or swelling Alignment: Symmetrical Functional ROM: Unrestricted ROM      Stability: No instability detected Muscle strength & Tone: Functionally intact Sensory: Unimpaired Palpation: No palpable anomalies              Upper Extremity (UE) Exam    Side: Right upper extremity  Side: Left upper extremity  Inspection: No masses, redness, swelling, or asymmetry. No contractures   Inspection: No masses, redness, swelling, or asymmetry. No contractures  Functional ROM: Decreased ROM          Functional ROM: Unrestricted ROM          Muscle strength & Tone: Functionally intact  Muscle strength & Tone: Functionally intact  Sensory: Unimpaired  Sensory: Unimpaired  Palpation: No palpable anomalies              Palpation: No palpable anomalies              Specialized Test(s): Deferred         Specialized Test(s): Deferred          Thoracic Spine Exam  Inspection: No masses, redness, or swelling Alignment: Symmetrical Functional ROM: Unrestricted ROM Stability: No instability detected Sensory: Unimpaired Muscle strength & Tone: No palpable anomalies  Lumbar Spine Exam  Inspection: No masses, redness, or swelling Alignment: Symmetrical Functional ROM: Pain restricted ROM      Stability: No instability detected Muscle strength & Tone: Functionally intact Sensory: Unimpaired Palpation: No palpable anomalies       Provocative Tests: Lumbar Hyperextension and rotation test: Unable to perform       Patrick's Maneuver: evaluation deferred today                    Gait & Posture Assessment  Ambulation: Patient ambulates using a walker Gait: Limited. Using assistive device to ambulate Posture: WNL   Lower Extremity Exam    Side: Right lower extremity  Side: Left lower extremity  Inspection: No masses, redness, swelling, or asymmetry. No contractures  Inspection:Mild swelling   Functional ROM: Unrestricted ROM          Functional ROM: Unrestricted ROM          Muscle strength & Tone: Functionally intact  Muscle strength & Tone: Functionally intact  Sensory: Unimpaired  Sensory: Unimpaired  Palpation: No palpable anomalies  Palpation: No palpable anomalies   Assessment  Primary Diagnosis & Pertinent Problem List: The primary encounter diagnosis was Pain in joint of right shoulder. 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  Visit Diagnosis: 1. Pain in joint of right shoulder   2. Chronic pain of left knee   3. 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 Chronic hip pain, unspecified laterality   5. Chronic pain syndrome   6. Long term current use of opiate analgesic   7. Long term prescription opiate use   8. Opiate use    Plan of Care  Initial treatment plan:  Please be advised that as per protocol, today's visit has been an evaluation only. We have not taken over the patient's controlled substance management.  Problem-specific plan: No problem-specific Assessment & Plan notes found for this encounter.  Ordered Lab-work, Procedure(s), Referral(s), & Consult(s): Orders Placed This Encounter  Procedures  . DG HIP UNILAT W OR W/O PELVIS 2-3 VIEWS LEFT  . DG HIP UNILAT W OR W/O PELVIS 2-3 VIEWS RIGHT  . DG Shoulder Right  . Compliance Drug Analysis, Ur  . Comprehensive metabolic panel  . C-reactive protein  . Magnesium  . Sedimentation rate  . Vitamin B12  . 25-Hydroxyvitamin D Lcms D2+D3  . Ambulatory referral to Psychology   Pharmacotherapy: Medications ordered:  No orders of the defined types were placed in this encounter.  Medications administered during this visit: Ms. Muldrow had no medications administered during this visit.   Pharmacotherapy under consideration:  Opioid Analgesics: The patient was informed that there is no guarantee that she would be a candidate for opioid analgesics. The decision will be made following CDC guidelines. This decision will be based on the results of diagnostic studies, as well as Ms. Dinapoli's risk profile.  Membrane stabilizer: To be determined at a later time Muscle relaxant: To be determined at a later time NSAID: To be determined at a later time Other  analgesic(s): To be determined at a later time   Interventional therapies under consideration: Ms. Osborne was informed that there is no guarantee that she would be a candidate for interventional therapies. The decision will be based on the results of diagnostic studies, as well as Ms. Neglia's risk profile.  Possible procedure(s): Diagnostic left knee genicular Nerve block Possible left knee radiofrequency ablation Possible bilateral hip injection    Provider-requested follow-up: Return for 2nd Visit, w/ Dr. Dossie Arbour, after [facility] eval.  No future appointments.  Primary Care Physician: Letta Median, MD Location: [facility] Outpatient Pain Management Facility Note by:  Date: 04/16/2017; Time: 3:28 PM  Pain Score Disclaimer: We use the NRS-11 scale. This is a self-reported, subjective measurement of pain severity with only modest accuracy. It is used primarily to identify changes within a particular patient. It must be understood that outpatient pain scales are significantly less accurate that those used for research, where they can be applied under ideal controlled circumstances with minimal exposure to variables. In reality, the score is likely to be a combination of pain intensity and pain affect, where pain affect describes the degree of emotional arousal or changes in action readiness caused by the sensory experience of pain. Factors such as social and work situation, setting, emotional  state, anxiety levels, expectation, and prior pain experience may influence pain perception and show large inter-individual differences that may also be affected by time variables.  Patient instructions provided during this appointment: Patient Instructions    ____________________________________________________________________________________________  Appointment Policy Summary  It is our goal and responsibility to provide the medical community with assistance in the evaluation and management of  patients with chronic pain. Unfortunately our resources are limited. Because we do not have an unlimited amount of time, or available appointments, we are required to closely monitor and manage their use. The following rules exist to maximize their use:  Patient's responsibilities: 1. Punctuality: You are required to be physically present and registered in our facility at least 30 minutes before your appointment. 2. Tardiness: The cutoff is your appointment time. If you have an appointment scheduled for 10:00 AM and you arrive at 10:01, you will be required to reschedule your appointment.  3. Plan ahead: Always assume that you will encounter traffic on your way in. Plan for it. If you are dependent on a driver, make sure they understand these rules and the need to arrive early. 4. Other appointments and responsibilities: Avoid scheduling any other appointments before or after your pain clinic appointments.  5. Be prepared: Write down everything that you need to discuss with your healthcare provider and give this information to the admitting nurse. Write down the medications that you will need refilled. Bring your pills and bottles (even the empty ones), to all of your appointments, except for those where a procedure is scheduled. 6. No children or pets: Find someone to take care of them. It is not appropriate to bring them in. 7. Scheduling changes: We request "advanced notification" of any changes or cancellations. 8. Advanced notification: Defined as a time period of more than 24 hours prior to the originally scheduled appointment. This allows for the appointment to be offered to other patients. 9. Rescheduling: When a visit is rescheduled, it will require the cancellation of the original appointment. For this reason they both fall within the category of "Cancellations".  10. Cancellations: They require advanced notification. Any cancellation less than 24 hours before the  appointment will be recorded  as a "No Show". 11. No Show: Defined as an unkept appointment where the patient failed to notify or declare to the practice their intention or inability to keep the appointment.  Corrective process for repeat offenders:  1. Tardiness: Three (3) episodes of rescheduling due to late arrivals will be recorded as one (1) "No Show". 2. Cancellation or reschedule: Three (3) cancellations or rescheduling will be recorded as one (1) "No Show". 3. "No Shows": Three (3) "No Shows" within a 12 month period will result in discharge from the practice.  ____________________________________________________________________________________________  ____________________________________________________________________________________________  Pain Scale  Introduction: The pain score used by this practice is the Verbal Numerical Rating Scale (VNRS-11). This is an 11-point scale. It is for adults and children 10 years or older. There are significant differences in how the pain score is reported, used, and applied. Forget everything you learned in the past and learn this scoring system.  General Information: The scale should reflect your current level of pain. Unless you are specifically asked for the level of your worst pain, or your average pain. If you are asked for one of these two, then it should be understood that it is over the past 24 hours.  Basic Activities of Daily Living (ADL): Personal hygiene, dressing, eating, transferring, and using restroom.  Instructions:  Most patients tend to report their level of pain as a combination of two factors, their physical pain and their psychosocial pain. This last one is also known as "suffering" and it is reflection of how physical pain affects you socially and psychologically. From now on, report them separately. From this point on, when asked to report your pain level, report only your physical pain. Use the following table for reference.  Pain Clinic Pain Levels  (0-5/10)  Pain Level Score  Description  No Pain 0   Mild pain 1 Nagging, annoying, but does not interfere with basic activities of daily living (ADL). Patients are able to eat, bathe, get dressed, toileting (being able to get on and off the toilet and perform personal hygiene functions), transfer (move in and out of bed or a chair without assistance), and maintain continence (able to control bladder and bowel functions). Blood pressure and heart rate are unaffected. A normal heart rate for a healthy adult ranges from 60 to 100 bpm (beats per minute).   Mild to moderate pain 2 Noticeable and distracting. Impossible to hide from other people. More frequent flare-ups. Still possible to adapt and function close to normal. It can be very annoying and may have occasional stronger flare-ups. With discipline, patients may get used to it and adapt.   Moderate pain 3 Interferes significantly with activities of daily living (ADL). It becomes difficult to feed, bathe, get dressed, get on and off the toilet or to perform personal hygiene functions. Difficult to get in and out of bed or a chair without assistance. Very distracting. With effort, it can be ignored when deeply involved in activities.   Moderately severe pain 4 Impossible to ignore for more than a few minutes. With effort, patients may still be able to manage work or participate in some social activities. Very difficult to concentrate. Signs of autonomic nervous system discharge are evident: dilated pupils (mydriasis); mild sweating (diaphoresis); sleep interference. Heart rate becomes elevated (>115 bpm). Diastolic blood pressure (lower number) rises above 100 mmHg. Patients find relief in laying down and not moving.   Severe pain 5 Intense and extremely unpleasant. Associated with frowning face and frequent crying. Pain overwhelms the senses.  Ability to do any activity or maintain social relationships becomes significantly limited. Conversation  becomes difficult. Pacing back and forth is common, as getting into a comfortable position is nearly impossible. Pain wakes you up from deep sleep. Physical signs will be obvious: pupillary dilation; increased sweating; goosebumps; brisk reflexes; cold, clammy hands and feet; nausea, vomiting or dry heaves; loss of appetite; significant sleep disturbance with inability to fall asleep or to remain asleep. When persistent, significant weight loss is observed due to the complete loss of appetite and sleep deprivation.  Blood pressure and heart rate becomes significantly elevated. Caution: If elevated blood pressure triggers a pounding headache associated with blurred vision, then the patient should immediately seek attention at an urgent or emergency care unit, as these may be signs of an impending stroke.    Emergency Department Pain Levels (6-10/10)  Emergency Room Pain 6 Severely limiting. Requires emergency care and should not be seen or managed at an outpatient pain management facility. Communication becomes difficult and requires great effort. Assistance to reach the emergency department may be required. Facial flushing and profuse sweating along with potentially dangerous increases in heart rate and blood pressure will be evident.   Distressing pain 7 Self-care is very difficult. Assistance is required to transport, or use restroom. Assistance  to reach the emergency department will be required. Tasks requiring coordination, such as bathing and getting dressed become very difficult.   Disabling pain 8 Self-care is no longer possible. At this level, pain is disabling. The individual is unable to do even the most "basic" activities such as walking, eating, bathing, dressing, transferring to a bed, or toileting. Fine motor skills are lost. It is difficult to think clearly.   Incapacitating pain 9 Pain becomes incapacitating. Thought processing is no longer possible. Difficult to remember your own name.  Control of movement and coordination are lost.   The worst pain imaginable 10 At this level, most patients pass out from pain. When this level is reached, collapse of the autonomic nervous system occurs, leading to a sudden drop in blood pressure and heart rate. This in turn results in a temporary and dramatic drop in blood flow to the brain, leading to a loss of consciousness. Fainting is one of the body's self defense mechanisms. Passing out puts the brain in a calmed state and causes it to shut down for a while, in order to begin the healing process.    Summary: 1. Refer to this scale when providing [place] with your pain level. 2. Be accurate and careful when reporting your pain level. This will help with your care. 3. Over-reporting your pain level will lead to loss of credibility. 4. Even a level of 1/10 means that there is pain and will be treated at our facility. 5. High, inaccurate reporting will be documented as "Symptom Exaggeration", leading to loss of credibility and suspicions of possible secondary gains such as obtaining more narcotics, or wanting to appear disabled, for fraudulent reasons. 6. Only pain levels of 5 or below will be seen at our facility. 7. Pain levels of 6 and above will be sent to the Emergency Department and the appointment cancelled.  Please get your labs and x-rays done as soon as possible. ____________________________________________________________________________________________

## 2017-04-16 ENCOUNTER — Other Ambulatory Visit
Admission: RE | Admit: 2017-04-16 | Discharge: 2017-04-16 | Disposition: A | Discharge status: Home / Self Care | Payer: Medicare Other | Source: Ambulatory Visit | Attending: Pain Medicine | Admitting: Pain Medicine

## 2017-04-16 ENCOUNTER — Ambulatory Visit
Admission: RE | Admit: 2017-04-16 | Discharge: 2017-04-16 | Disposition: A | Discharge status: Home / Self Care | Payer: Medicare Other | Source: Ambulatory Visit | Attending: Pain Medicine | Admitting: Pain Medicine

## 2017-04-16 ENCOUNTER — Ambulatory Visit
Admission: RE | Admit: 2017-04-16 | Discharge: 2017-04-16 | Disposition: A | Discharge status: Home / Self Care | Payer: Medicare Other | Source: Ambulatory Visit | Attending: Nurse Practitioner | Admitting: Nurse Practitioner

## 2017-04-16 ENCOUNTER — Ambulatory Visit: Payer: Medicare Other | Attending: Nurse Practitioner | Admitting: Nurse Practitioner

## 2017-04-16 ENCOUNTER — Encounter: Payer: Self-pay | Admitting: Nurse Practitioner

## 2017-04-16 VITALS — BP 114/61 | HR 71 | Temp 97.9°F | Resp 16 | Ht 59.0 in | Wt 170.0 lb

## 2017-04-16 DIAGNOSIS — Z79891 Long term (current) use of opiate analgesic: Secondary | ICD-10-CM

## 2017-04-16 DIAGNOSIS — R52 Pain, unspecified: Secondary | ICD-10-CM | POA: Diagnosis not present

## 2017-04-16 DIAGNOSIS — M79604 Pain in right leg: Secondary | ICD-10-CM | POA: Insufficient documentation

## 2017-04-16 DIAGNOSIS — F329 Major depressive disorder, single episode, unspecified: Secondary | ICD-10-CM | POA: Insufficient documentation

## 2017-04-16 DIAGNOSIS — R42 Dizziness and giddiness: Secondary | ICD-10-CM | POA: Insufficient documentation

## 2017-04-16 DIAGNOSIS — F1190 Opioid use, unspecified, uncomplicated: Secondary | ICD-10-CM | POA: Diagnosis not present

## 2017-04-16 DIAGNOSIS — D680 Von Willebrand's disease: Secondary | ICD-10-CM | POA: Diagnosis not present

## 2017-04-16 DIAGNOSIS — I129 Hypertensive chronic kidney disease with stage 1 through stage 4 chronic kidney disease, or unspecified chronic kidney disease: Secondary | ICD-10-CM | POA: Insufficient documentation

## 2017-04-16 DIAGNOSIS — F419 Anxiety disorder, unspecified: Secondary | ICD-10-CM | POA: Insufficient documentation

## 2017-04-16 DIAGNOSIS — E1122 Type 2 diabetes mellitus with diabetic chronic kidney disease: Secondary | ICD-10-CM | POA: Insufficient documentation

## 2017-04-16 DIAGNOSIS — N320 Bladder-neck obstruction: Secondary | ICD-10-CM | POA: Diagnosis not present

## 2017-04-16 DIAGNOSIS — M25511 Pain in right shoulder: Secondary | ICD-10-CM | POA: Diagnosis not present

## 2017-04-16 DIAGNOSIS — R531 Weakness: Secondary | ICD-10-CM | POA: Diagnosis not present

## 2017-04-16 DIAGNOSIS — E1142 Type 2 diabetes mellitus with diabetic polyneuropathy: Secondary | ICD-10-CM | POA: Diagnosis not present

## 2017-04-16 DIAGNOSIS — N3010 Interstitial cystitis (chronic) without hematuria: Secondary | ICD-10-CM

## 2017-04-16 DIAGNOSIS — K219 Gastro-esophageal reflux disease without esophagitis: Secondary | ICD-10-CM | POA: Insufficient documentation

## 2017-04-16 DIAGNOSIS — R109 Unspecified abdominal pain: Secondary | ICD-10-CM | POA: Insufficient documentation

## 2017-04-16 DIAGNOSIS — J449 Chronic obstructive pulmonary disease, unspecified: Secondary | ICD-10-CM | POA: Insufficient documentation

## 2017-04-16 DIAGNOSIS — G8929 Other chronic pain: Secondary | ICD-10-CM

## 2017-04-16 DIAGNOSIS — G894 Chronic pain syndrome: Secondary | ICD-10-CM | POA: Insufficient documentation

## 2017-04-16 DIAGNOSIS — M25562 Pain in left knee: Secondary | ICD-10-CM | POA: Insufficient documentation

## 2017-04-16 DIAGNOSIS — N3941 Urge incontinence: Secondary | ICD-10-CM | POA: Diagnosis not present

## 2017-04-16 DIAGNOSIS — Z794 Long term (current) use of insulin: Secondary | ICD-10-CM | POA: Insufficient documentation

## 2017-04-16 DIAGNOSIS — S4991XA Unspecified injury of right shoulder and upper arm, initial encounter: Secondary | ICD-10-CM | POA: Diagnosis not present

## 2017-04-16 DIAGNOSIS — N3011 Interstitial cystitis (chronic) with hematuria: Secondary | ICD-10-CM | POA: Diagnosis not present

## 2017-04-16 DIAGNOSIS — Z96652 Presence of left artificial knee joint: Secondary | ICD-10-CM | POA: Insufficient documentation

## 2017-04-16 DIAGNOSIS — M25559 Pain in unspecified hip: Principal | ICD-10-CM

## 2017-04-16 DIAGNOSIS — R339 Retention of urine, unspecified: Secondary | ICD-10-CM | POA: Diagnosis not present

## 2017-04-16 DIAGNOSIS — E782 Mixed hyperlipidemia: Secondary | ICD-10-CM | POA: Insufficient documentation

## 2017-04-16 DIAGNOSIS — R296 Repeated falls: Secondary | ICD-10-CM | POA: Insufficient documentation

## 2017-04-16 DIAGNOSIS — M25569 Pain in unspecified knee: Secondary | ICD-10-CM | POA: Insufficient documentation

## 2017-04-16 DIAGNOSIS — M25519 Pain in unspecified shoulder: Secondary | ICD-10-CM | POA: Insufficient documentation

## 2017-04-16 DIAGNOSIS — N189 Chronic kidney disease, unspecified: Secondary | ICD-10-CM | POA: Diagnosis not present

## 2017-04-16 LAB — C-REACTIVE PROTEIN

## 2017-04-16 LAB — COMPREHENSIVE METABOLIC PANEL
ALBUMIN: 3.9 g/dL (ref 3.5–5.0)
ALT: 22 U/L (ref 14–54)
ANION GAP: 7 (ref 5–15)
AST: 42 U/L — ABNORMAL HIGH (ref 15–41)
Alkaline Phosphatase: 73 U/L (ref 38–126)
BILIRUBIN TOTAL: 0.5 mg/dL (ref 0.3–1.2)
BUN: 9 mg/dL (ref 6–20)
CALCIUM: 8.7 mg/dL — AB (ref 8.9–10.3)
CO2: 24 mmol/L (ref 22–32)
Chloride: 105 mmol/L (ref 101–111)
Creatinine, Ser: 0.73 mg/dL (ref 0.44–1.00)
Glucose, Bld: 125 mg/dL — ABNORMAL HIGH (ref 65–99)
POTASSIUM: 3.5 mmol/L (ref 3.5–5.1)
Sodium: 136 mmol/L (ref 135–145)
TOTAL PROTEIN: 6.9 g/dL (ref 6.5–8.1)

## 2017-04-16 LAB — SEDIMENTATION RATE: SED RATE: 2 mm/h (ref 0–30)

## 2017-04-16 LAB — MAGNESIUM: MAGNESIUM: 1.9 mg/dL (ref 1.7–2.4)

## 2017-04-16 LAB — VITAMIN B12: VITAMIN B 12: 398 pg/mL (ref 180–914)

## 2017-04-16 NOTE — Progress Notes (Signed)
Safety precautions to be maintained throughout the outpatient stay will include: orient to surroundings, keep bed in low position, maintain call bell within reach at all times, provide assistance with transfer out of bed and ambulation.  

## 2017-04-16 NOTE — Patient Instructions (Addendum)
____________________________________________________________________________________________  Appointment Policy Summary  It is our goal and responsibility to provide the medical community with assistance in the evaluation and management of patients with chronic pain. Unfortunately our resources are limited. Because we do not have an unlimited amount of time, or available appointments, we are required to closely monitor and manage their use. The following rules exist to maximize their use:  Patient's responsibilities: 1. Punctuality: You are required to be physically present and registered in our facility at least 30 minutes before your appointment. 2. Tardiness: The cutoff is your appointment time. If you have an appointment scheduled for 10:00 AM and you arrive at 10:01, you will be required to reschedule your appointment.  3. Plan ahead: Always assume that you will encounter traffic on your way in. Plan for it. If you are dependent on a driver, make sure they understand these rules and the need to arrive early. 4. Other appointments and responsibilities: Avoid scheduling any other appointments before or after your pain clinic appointments.  5. Be prepared: Write down everything that you need to discuss with your healthcare provider and give this information to the admitting nurse. Write down the medications that you will need refilled. Bring your pills and bottles (even the empty ones), to all of your appointments, except for those where a procedure is scheduled. 6. No children or pets: Find someone to take care of them. It is not appropriate to bring them in. 7. Scheduling changes: We request "advanced notification" of any changes or cancellations. 8. Advanced notification: Defined as a time period of more than 24 hours prior to the originally scheduled appointment. This allows for the appointment to be offered to other patients. 9. Rescheduling: When a visit is rescheduled, it will require the  cancellation of the original appointment. For this reason they both fall within the category of "Cancellations".  10. Cancellations: They require advanced notification. Any cancellation less than 24 hours before the  appointment will be recorded as a "No Show". 11. No Show: Defined as an unkept appointment where the patient failed to notify or declare to the practice their intention or inability to keep the appointment.  Corrective process for repeat offenders:  1. Tardiness: Three (3) episodes of rescheduling due to late arrivals will be recorded as one (1) "No Show". 2. Cancellation or reschedule: Three (3) cancellations or rescheduling will be recorded as one (1) "No Show". 3. "No Shows": Three (3) "No Shows" within a 12 month period will result in discharge from the practice.  ____________________________________________________________________________________________  ____________________________________________________________________________________________  Pain Scale  Introduction: The pain score used by this practice is the Verbal Numerical Rating Scale (VNRS-11). This is an 11-point scale. It is for adults and children 10 years or older. There are significant differences in how the pain score is reported, used, and applied. Forget everything you learned in the past and learn this scoring system.  General Information: The scale should reflect your current level of pain. Unless you are specifically asked for the level of your worst pain, or your average pain. If you are asked for one of these two, then it should be understood that it is over the past 24 hours.  Basic Activities of Daily Living (ADL): Personal hygiene, dressing, eating, transferring, and using restroom.  Instructions: Most patients tend to report their level of pain as a combination of two factors, their physical pain and their psychosocial pain. This last one is also known as "suffering" and it is reflection of how  physical   pain affects you socially and psychologically. From now on, report them separately. From this point on, when asked to report your pain level, report only your physical pain. Use the following table for reference.  Pain Clinic Pain Levels (0-5/10)  Pain Level Score  Description  No Pain 0   Mild pain 1 Nagging, annoying, but does not interfere with basic activities of daily living (ADL). Patients are able to eat, bathe, get dressed, toileting (being able to get on and off the toilet and perform personal hygiene functions), transfer (move in and out of bed or a chair without assistance), and maintain continence (able to control bladder and bowel functions). Blood pressure and heart rate are unaffected. A normal heart rate for a healthy adult ranges from 60 to 100 bpm (beats per minute).   Mild to moderate pain 2 Noticeable and distracting. Impossible to hide from other people. More frequent flare-ups. Still possible to adapt and function close to normal. It can be very annoying and may have occasional stronger flare-ups. With discipline, patients may get used to it and adapt.   Moderate pain 3 Interferes significantly with activities of daily living (ADL). It becomes difficult to feed, bathe, get dressed, get on and off the toilet or to perform personal hygiene functions. Difficult to get in and out of bed or a chair without assistance. Very distracting. With effort, it can be ignored when deeply involved in activities.   Moderately severe pain 4 Impossible to ignore for more than a few minutes. With effort, patients may still be able to manage work or participate in some social activities. Very difficult to concentrate. Signs of autonomic nervous system discharge are evident: dilated pupils (mydriasis); mild sweating (diaphoresis); sleep interference. Heart rate becomes elevated (>115 bpm). Diastolic blood pressure (lower number) rises above 100 mmHg. Patients find relief in laying down and not  moving.   Severe pain 5 Intense and extremely unpleasant. Associated with frowning face and frequent crying. Pain overwhelms the senses.  Ability to do any activity or maintain social relationships becomes significantly limited. Conversation becomes difficult. Pacing back and forth is common, as getting into a comfortable position is nearly impossible. Pain wakes you up from deep sleep. Physical signs will be obvious: pupillary dilation; increased sweating; goosebumps; brisk reflexes; cold, clammy hands and feet; nausea, vomiting or dry heaves; loss of appetite; significant sleep disturbance with inability to fall asleep or to remain asleep. When persistent, significant weight loss is observed due to the complete loss of appetite and sleep deprivation.  Blood pressure and heart rate becomes significantly elevated. Caution: If elevated blood pressure triggers a pounding headache associated with blurred vision, then the patient should immediately seek attention at an urgent or emergency care unit, as these may be signs of an impending stroke.    Emergency Department Pain Levels (6-10/10)  Emergency Room Pain 6 Severely limiting. Requires emergency care and should not be seen or managed at an outpatient pain management facility. Communication becomes difficult and requires great effort. Assistance to reach the emergency department may be required. Facial flushing and profuse sweating along with potentially dangerous increases in heart rate and blood pressure will be evident.   Distressing pain 7 Self-care is very difficult. Assistance is required to transport, or use restroom. Assistance to reach the emergency department will be required. Tasks requiring coordination, such as bathing and getting dressed become very difficult.   Disabling pain 8 Self-care is no longer possible. At this level, pain is disabling. The individual   is unable to do even the most "basic" activities such as walking, eating, bathing,  dressing, transferring to a bed, or toileting. Fine motor skills are lost. It is difficult to think clearly.   Incapacitating pain 9 Pain becomes incapacitating. Thought processing is no longer possible. Difficult to remember your own name. Control of movement and coordination are lost.   The worst pain imaginable 10 At this level, most patients pass out from pain. When this level is reached, collapse of the autonomic nervous system occurs, leading to a sudden drop in blood pressure and heart rate. This in turn results in a temporary and dramatic drop in blood flow to the brain, leading to a loss of consciousness. Fainting is one of the body's self defense mechanisms. Passing out puts the brain in a calmed state and causes it to shut down for a while, in order to begin the healing process.    Summary: 1. Refer to this scale when providing [place] with your pain level. 2. Be accurate and careful when reporting your pain level. This will help with your care. 3. Over-reporting your pain level will lead to loss of credibility. 4. Even a level of 1/10 means that there is pain and will be treated at our facility. 5. High, inaccurate reporting will be documented as "Symptom Exaggeration", leading to loss of credibility and suspicions of possible secondary gains such as obtaining more narcotics, or wanting to appear disabled, for fraudulent reasons. 6. Only pain levels of 5 or below will be seen at our facility. 7. Pain levels of 6 and above will be sent to the Emergency Department and the appointment cancelled.  Please get your labs and x-rays done as soon as possible. ____________________________________________________________________________________________

## 2017-04-19 LAB — 25-HYDROXY VITAMIN D LCMS D2+D3
25-Hydroxy, Vitamin D-3: 37 ng/mL
25-Hydroxy, Vitamin D: 37 ng/mL

## 2017-04-19 LAB — 25-HYDROXYVITAMIN D LCMS D2+D3

## 2017-04-21 NOTE — Progress Notes (Signed)
Results were reviewed and found to be: mildly abnormal  No acute injury or pathology identified  Review would suggest interventional pain management techniques may be of benefit 

## 2017-04-22 LAB — COMPLIANCE DRUG ANALYSIS, UR

## 2017-05-14 ENCOUNTER — Ambulatory Visit: Admission: RE | Admit: 2017-05-14 | Discharge: 2017-05-14 | Payer: MEDICARE | Attending: Urology | Admitting: Urology

## 2017-05-14 DIAGNOSIS — R350 Frequency of micturition: Principal | ICD-10-CM

## 2017-05-14 DIAGNOSIS — R339 Retention of urine, unspecified: Secondary | ICD-10-CM

## 2017-05-14 DIAGNOSIS — G8929 Other chronic pain: Secondary | ICD-10-CM

## 2017-05-14 DIAGNOSIS — M25551 Pain in right hip: Secondary | ICD-10-CM

## 2017-05-21 ENCOUNTER — Ambulatory Visit
Admission: RE | Admit: 2017-05-21 | Discharge: 2017-05-21 | Disposition: A | Discharge status: Home / Self Care | Payer: MEDICARE | Attending: Hematology & Oncology | Admitting: Hematology & Oncology

## 2017-05-21 ENCOUNTER — Ambulatory Visit
Admission: RE | Admit: 2017-05-21 | Discharge: 2017-05-21 | Disposition: A | Discharge status: Home / Self Care | Payer: MEDICARE

## 2017-05-21 DIAGNOSIS — D45 Polycythemia vera: Principal | ICD-10-CM

## 2017-05-21 MED ORDER — ASPIRIN 81 MG TABLET,DELAYED RELEASE
ORAL_TABLET | Freq: Every day | ORAL | 2 refills | 0 days
Start: 2017-05-21 — End: 2026-05-21

## 2017-05-26 ENCOUNTER — Ambulatory Visit
Admission: RE | Admit: 2017-05-26 | Discharge: 2017-05-26 | Disposition: A | Discharge status: Home / Self Care | Payer: MEDICARE | Attending: Urology | Admitting: Urology

## 2017-05-26 DIAGNOSIS — R300 Dysuria: Secondary | ICD-10-CM

## 2017-05-26 DIAGNOSIS — R339 Retention of urine, unspecified: Principal | ICD-10-CM

## 2017-05-26 DIAGNOSIS — N3020 Other chronic cystitis without hematuria: Secondary | ICD-10-CM

## 2017-06-16 ENCOUNTER — Emergency Department: Payer: Medicare Other

## 2017-06-16 ENCOUNTER — Inpatient Hospital Stay
Admission: EM | Admit: 2017-06-16 | Discharge: 2017-06-18 | DRG: 378 | Disposition: A | Discharge status: Home Health | Payer: Medicare Other | Attending: Internal Medicine | Admitting: Internal Medicine

## 2017-06-16 DIAGNOSIS — J449 Chronic obstructive pulmonary disease, unspecified: Secondary | ICD-10-CM | POA: Diagnosis present

## 2017-06-16 DIAGNOSIS — Z79899 Other long term (current) drug therapy: Secondary | ICD-10-CM | POA: Diagnosis not present

## 2017-06-16 DIAGNOSIS — Z87891 Personal history of nicotine dependence: Secondary | ICD-10-CM | POA: Diagnosis not present

## 2017-06-16 DIAGNOSIS — D6800 Von Willebrand disease, unspecified: Secondary | ICD-10-CM

## 2017-06-16 DIAGNOSIS — N189 Chronic kidney disease, unspecified: Secondary | ICD-10-CM | POA: Diagnosis present

## 2017-06-16 DIAGNOSIS — D509 Iron deficiency anemia, unspecified: Secondary | ICD-10-CM | POA: Diagnosis present

## 2017-06-16 DIAGNOSIS — F329 Major depressive disorder, single episode, unspecified: Secondary | ICD-10-CM | POA: Diagnosis present

## 2017-06-16 DIAGNOSIS — K580 Irritable bowel syndrome with diarrhea: Secondary | ICD-10-CM | POA: Diagnosis present

## 2017-06-16 DIAGNOSIS — K219 Gastro-esophageal reflux disease without esophagitis: Secondary | ICD-10-CM | POA: Diagnosis present

## 2017-06-16 DIAGNOSIS — K921 Melena: Principal | ICD-10-CM | POA: Diagnosis present

## 2017-06-16 DIAGNOSIS — Z888 Allergy status to other drugs, medicaments and biological substances status: Secondary | ICD-10-CM

## 2017-06-16 DIAGNOSIS — F419 Anxiety disorder, unspecified: Secondary | ICD-10-CM | POA: Diagnosis present

## 2017-06-16 DIAGNOSIS — Z86010 Personal history of colonic polyps: Secondary | ICD-10-CM

## 2017-06-16 DIAGNOSIS — E1122 Type 2 diabetes mellitus with diabetic chronic kidney disease: Secondary | ICD-10-CM | POA: Diagnosis present

## 2017-06-16 DIAGNOSIS — G43909 Migraine, unspecified, not intractable, without status migrainosus: Secondary | ICD-10-CM | POA: Diagnosis present

## 2017-06-16 DIAGNOSIS — Z886 Allergy status to analgesic agent status: Secondary | ICD-10-CM | POA: Diagnosis not present

## 2017-06-16 DIAGNOSIS — Z79890 Hormone replacement therapy (postmenopausal): Secondary | ICD-10-CM | POA: Diagnosis not present

## 2017-06-16 DIAGNOSIS — K922 Gastrointestinal hemorrhage, unspecified: Secondary | ICD-10-CM | POA: Diagnosis present

## 2017-06-16 DIAGNOSIS — J4520 Mild intermittent asthma, uncomplicated: Secondary | ICD-10-CM | POA: Diagnosis present

## 2017-06-16 DIAGNOSIS — Z87440 Personal history of urinary (tract) infections: Secondary | ICD-10-CM

## 2017-06-16 DIAGNOSIS — I129 Hypertensive chronic kidney disease with stage 1 through stage 4 chronic kidney disease, or unspecified chronic kidney disease: Secondary | ICD-10-CM | POA: Diagnosis present

## 2017-06-16 DIAGNOSIS — D45 Polycythemia vera: Secondary | ICD-10-CM | POA: Diagnosis present

## 2017-06-16 DIAGNOSIS — E876 Hypokalemia: Secondary | ICD-10-CM | POA: Diagnosis present

## 2017-06-16 DIAGNOSIS — R319 Hematuria, unspecified: Secondary | ICD-10-CM | POA: Diagnosis present

## 2017-06-16 DIAGNOSIS — K648 Other hemorrhoids: Secondary | ICD-10-CM | POA: Diagnosis present

## 2017-06-16 DIAGNOSIS — Z7984 Long term (current) use of oral hypoglycemic drugs: Secondary | ICD-10-CM

## 2017-06-16 DIAGNOSIS — E782 Mixed hyperlipidemia: Secondary | ICD-10-CM | POA: Diagnosis present

## 2017-06-16 LAB — HEMOGLOBIN: HEMOGLOBIN: 10.2 g/dL — AB (ref 12.0–16.0)

## 2017-06-16 LAB — COMPREHENSIVE METABOLIC PANEL
ALK PHOS: 81 U/L (ref 38–126)
ALT: 21 U/L (ref 14–54)
AST: 43 U/L — AB (ref 15–41)
Albumin: 3.8 g/dL (ref 3.5–5.0)
Anion gap: 8 (ref 5–15)
BILIRUBIN TOTAL: 0.5 mg/dL (ref 0.3–1.2)
BUN: 8 mg/dL (ref 6–20)
CALCIUM: 9.1 mg/dL (ref 8.9–10.3)
CO2: 25 mmol/L (ref 22–32)
CREATININE: 0.76 mg/dL (ref 0.44–1.00)
Chloride: 108 mmol/L (ref 101–111)
GFR calc Af Amer: >60 mL/min (ref >60)
Glucose, Bld: 120 mg/dL — ABNORMAL HIGH (ref 65–99)
POTASSIUM: 3.6 mmol/L (ref 3.5–5.1)
Sodium: 141 mmol/L (ref 135–145)
TOTAL PROTEIN: 7.1 g/dL (ref 6.5–8.1)

## 2017-06-16 LAB — CBC
HEMATOCRIT: 38.2 % (ref 35.0–47.0)
Hemoglobin: 11.7 g/dL — ABNORMAL LOW (ref 12.0–16.0)
MCH: 19.8 pg — ABNORMAL LOW (ref 26.0–34.0)
MCHC: 30.6 g/dL — ABNORMAL LOW (ref 32.0–36.0)
MCV: 64.7 fL — ABNORMAL LOW (ref 80.0–100.0)
PLATELETS: 348 10*3/uL (ref 150–440)
RBC: 5.9 MIL/uL — ABNORMAL HIGH (ref 3.80–5.20)
RDW: 19.5 % — ABNORMAL HIGH (ref 11.5–14.5)
WBC: 12.3 10*3/uL — AB (ref 3.6–11.0)

## 2017-06-16 LAB — TYPE AND SCREEN
ABO/RH(D): O POS
ANTIBODY SCREEN: NEGATIVE

## 2017-06-16 LAB — GLUCOSE, CAPILLARY
Glucose-Capillary: 105 mg/dL — ABNORMAL HIGH (ref 65–99)
Glucose-Capillary: 105 mg/dL — ABNORMAL HIGH (ref 65–99)

## 2017-06-16 MED ORDER — DIAZEPAM 5 MG PO TABS: 10.0000 mg | ORAL_TABLET | Freq: Two times a day (BID) | ORAL | Status: DC | PRN

## 2017-06-16 MED ORDER — SODIUM CHLORIDE 0.9 % IV SOLN
0.3000 ug/kg | Freq: Once | INTRAVENOUS | Status: AC
  Administered 2017-06-16: 22.4 ug via INTRAVENOUS
  Filled 2017-06-16: qty 5.6

## 2017-06-16 MED ORDER — IOPAMIDOL (ISOVUE-300) INJECTION 61%
100.0000 mL | Freq: Once | INTRAVENOUS | Status: AC | PRN
  Administered 2017-06-16: 100 mL via INTRAVENOUS

## 2017-06-16 MED ORDER — PROPRANOLOL HCL 20 MG PO TABS
40.0000 mg | ORAL_TABLET | Freq: Two times a day (BID) | ORAL | Status: DC
  Administered 2017-06-16 – 2017-06-17 (×2): 40 mg via ORAL
  Filled 2017-06-16 (×4): qty 2
  Filled 2017-06-16: qty 1

## 2017-06-16 MED ORDER — SODIUM CHLORIDE 0.9 % IV BOLUS (SEPSIS)
1000.0000 mL | Freq: Once | INTRAVENOUS | Status: AC
  Administered 2017-06-16: 1000 mL via INTRAVENOUS

## 2017-06-16 MED ORDER — VILAZODONE HCL 40 MG PO TABS
40.0000 mg | ORAL_TABLET | Freq: Every day | ORAL | Status: DC
  Filled 2017-06-16: qty 1

## 2017-06-16 MED ORDER — BUSPIRONE HCL 10 MG PO TABS
10.0000 mg | ORAL_TABLET | Freq: Two times a day (BID) | ORAL | Status: DC
  Administered 2017-06-16 – 2017-06-17 (×2): 10 mg via ORAL
  Filled 2017-06-16 (×3): qty 1

## 2017-06-16 MED ORDER — ACETAMINOPHEN 325 MG PO TABS: 650.0000 mg | ORAL_TABLET | Freq: Four times a day (QID) | ORAL | Status: DC | PRN

## 2017-06-16 MED ORDER — LISDEXAMFETAMINE DIMESYLATE 30 MG PO CAPS: 60.0000 mg | ORAL_CAPSULE | Freq: Every day | ORAL | Status: DC

## 2017-06-16 MED ORDER — ONDANSETRON HCL 4 MG/2ML IJ SOLN
4.0000 mg | Freq: Four times a day (QID) | INTRAMUSCULAR | Status: DC | PRN
  Administered 2017-06-16: 4 mg via INTRAVENOUS
  Filled 2017-06-16: qty 2

## 2017-06-16 MED ORDER — MORPHINE SULFATE (PF) 2 MG/ML IV SOLN
2.0000 mg | INTRAVENOUS | Status: DC | PRN
  Administered 2017-06-16 – 2017-06-17 (×5): 2 mg via INTRAVENOUS
  Filled 2017-06-16 (×5): qty 1

## 2017-06-16 MED ORDER — MORPHINE SULFATE (PF) 2 MG/ML IV SOLN
2.0000 mg | Freq: Once | INTRAVENOUS | Status: AC
  Administered 2017-06-16: 2 mg via INTRAVENOUS

## 2017-06-16 MED ORDER — MOMETASONE FURO-FORMOTEROL FUM 200-5 MCG/ACT IN AERO
2.0000 | INHALATION_SPRAY | Freq: Two times a day (BID) | RESPIRATORY_TRACT | Status: DC
  Administered 2017-06-16 – 2017-06-17 (×3): 2 via RESPIRATORY_TRACT
  Filled 2017-06-16: qty 8.8

## 2017-06-16 MED ORDER — BUPROPION HCL ER (XL) 150 MG PO TB24
300.0000 mg | ORAL_TABLET | Freq: Every day | ORAL | Status: DC
  Administered 2017-06-16: 300 mg via ORAL
  Filled 2017-06-16: qty 2

## 2017-06-16 MED ORDER — SODIUM CHLORIDE 0.9 % IV SOLN
80.0000 mg | Freq: Once | INTRAVENOUS | Status: AC
  Administered 2017-06-16: 16:00:00 80 mg via INTRAVENOUS
  Filled 2017-06-16: qty 80

## 2017-06-16 MED ORDER — INSULIN ASPART 100 UNIT/ML ~~LOC~~ SOLN: 0.0000 [IU] | Freq: Three times a day (TID) | SUBCUTANEOUS | Status: DC

## 2017-06-16 MED ORDER — ACETAMINOPHEN 650 MG RE SUPP: 650.0000 mg | Freq: Four times a day (QID) | RECTAL | Status: DC | PRN

## 2017-06-16 MED ORDER — MORPHINE SULFATE (PF) 2 MG/ML IV SOLN
INTRAVENOUS | Status: AC
  Filled 2017-06-16: qty 1

## 2017-06-16 MED ORDER — DICYCLOMINE HCL 10 MG PO CAPS
10.0000 mg | ORAL_CAPSULE | Freq: Every day | ORAL | Status: DC
  Administered 2017-06-16 – 2017-06-17 (×2): 10 mg via ORAL
  Filled 2017-06-16 (×2): qty 1

## 2017-06-16 MED ORDER — LISDEXAMFETAMINE DIMESYLATE 10 MG PO CAPS: 10.0000 mg | ORAL_CAPSULE | Freq: Every day | ORAL | Status: DC

## 2017-06-16 MED ORDER — GABAPENTIN 300 MG PO CAPS
900.0000 mg | ORAL_CAPSULE | Freq: Three times a day (TID) | ORAL | Status: DC
  Administered 2017-06-16 – 2017-06-17 (×4): 900 mg via ORAL
  Filled 2017-06-16 (×4): qty 3

## 2017-06-16 MED ORDER — OXYCODONE HCL 5 MG PO TABS
5.0000 mg | ORAL_TABLET | ORAL | Status: DC | PRN
  Administered 2017-06-16 – 2017-06-17 (×2): 5 mg via ORAL
  Filled 2017-06-16 (×2): qty 1

## 2017-06-16 MED ORDER — PANTOPRAZOLE SODIUM 40 MG IV SOLR: 40.0000 mg | Freq: Two times a day (BID) | INTRAVENOUS | Status: DC

## 2017-06-16 MED ORDER — INSULIN ASPART 100 UNIT/ML ~~LOC~~ SOLN
0.0000 [IU] | Freq: Every day | SUBCUTANEOUS | Status: DC
Start: 2017-06-16 — End: 2017-06-18

## 2017-06-16 MED ORDER — VILAZODONE HCL 20 MG PO TABS
40.0000 mg | ORAL_TABLET | Freq: Every day | ORAL | Status: DC
  Administered 2017-06-16 – 2017-06-17 (×2): 40 mg via ORAL
  Filled 2017-06-16 (×3): qty 2

## 2017-06-16 MED ORDER — MIRABEGRON ER 50 MG PO TB24
50.0000 mg | ORAL_TABLET | Freq: Every day | ORAL | Status: DC
  Administered 2017-06-16 – 2017-06-17 (×2): 50 mg via ORAL
  Filled 2017-06-16 (×3): qty 1

## 2017-06-16 MED ORDER — TAMSULOSIN HCL 0.4 MG PO CAPS
0.4000 mg | ORAL_CAPSULE | Freq: Every day | ORAL | Status: DC
  Administered 2017-06-16 – 2017-06-17 (×2): 0.4 mg via ORAL
  Filled 2017-06-16 (×2): qty 1

## 2017-06-16 MED ORDER — SODIUM CHLORIDE 0.9 % IV SOLN
INTRAVENOUS | Status: DC
  Administered 2017-06-16 – 2017-06-18 (×4): via INTRAVENOUS

## 2017-06-16 MED ORDER — GUAIFENESIN-DM 100-10 MG/5ML PO SYRP
5.0000 mL | ORAL_SOLUTION | ORAL | Status: DC | PRN
  Administered 2017-06-16: 5 mL via ORAL
  Filled 2017-06-16: qty 5

## 2017-06-16 MED ORDER — AMPHETAMINE-DEXTROAMPHETAMINE 5 MG PO TABS: 10.0000 mg | ORAL_TABLET | Freq: Every day | ORAL | Status: DC

## 2017-06-16 MED ORDER — ARIPIPRAZOLE 15 MG PO TABS
15.0000 mg | ORAL_TABLET | Freq: Two times a day (BID) | ORAL | Status: DC
  Administered 2017-06-16: 15 mg via ORAL
  Filled 2017-06-16 (×2): qty 1

## 2017-06-16 MED ORDER — SODIUM CHLORIDE 0.9% FLUSH
3.0000 mL | Freq: Two times a day (BID) | INTRAVENOUS | Status: DC
  Administered 2017-06-16 – 2017-06-17 (×3): 3 mL via INTRAVENOUS

## 2017-06-16 MED ORDER — IPRATROPIUM-ALBUTEROL 0.5-2.5 (3) MG/3ML IN SOLN: 3.0000 mL | RESPIRATORY_TRACT | Status: DC | PRN

## 2017-06-16 MED ORDER — TOPIRAMATE 25 MG PO TABS
50.0000 mg | ORAL_TABLET | Freq: Two times a day (BID) | ORAL | Status: DC
  Administered 2017-06-16 – 2017-06-17 (×3): 50 mg via ORAL
  Filled 2017-06-16 (×5): qty 2

## 2017-06-16 MED ORDER — LISDEXAMFETAMINE DIMESYLATE 70 MG PO CAPS
70.0000 mg | ORAL_CAPSULE | Freq: Every day | ORAL | Status: DC
  Administered 2017-06-16 – 2017-06-17 (×2): 70 mg via ORAL
  Filled 2017-06-16 (×2): qty 1

## 2017-06-16 MED ORDER — TIOTROPIUM BROMIDE MONOHYDRATE 18 MCG IN CAPS
18.0000 ug | ORAL_CAPSULE | Freq: Every day | RESPIRATORY_TRACT | Status: DC
  Administered 2017-06-16 – 2017-06-17 (×2): 18 ug via RESPIRATORY_TRACT
  Filled 2017-06-16: qty 5

## 2017-06-16 MED ORDER — LISDEXAMFETAMINE DIMESYLATE 30 MG PO CAPS
70.0000 mg | ORAL_CAPSULE | Freq: Every day | ORAL | Status: DC
  Filled 2017-06-16 (×2): qty 2

## 2017-06-16 MED ORDER — ONDANSETRON HCL 4 MG PO TABS: 4.0000 mg | ORAL_TABLET | Freq: Four times a day (QID) | ORAL | Status: DC | PRN

## 2017-06-16 MED ORDER — LURASIDONE HCL 40 MG PO TABS
40.0000 mg | ORAL_TABLET | Freq: Every day | ORAL | Status: DC
  Administered 2017-06-17: 40 mg via ORAL
  Filled 2017-06-16 (×2): qty 1

## 2017-06-16 MED ORDER — SODIUM CHLORIDE 0.9 % IV SOLN
8.0000 mg/h | INTRAVENOUS | Status: DC
  Administered 2017-06-16 – 2017-06-18 (×4): 8 mg/h via INTRAVENOUS
  Filled 2017-06-16 (×4): qty 80

## 2017-06-16 NOTE — ED Notes (Signed)
Pt provided warm blanket.

## 2017-06-16 NOTE — ED Triage Notes (Signed)
Pt presents to ED with c/o of a bleeding disorder that she sees cancer center for per husband. Pt states every few minutes having bloody stool. States has been going on a while but worse this weekend. States 1 vomit but no blood in it. Alert, oriented, ambulatory. Appears uncomfortable. States abd and rectum pain. States over weekend stool was bright red, now is black tarry.

## 2017-06-16 NOTE — H&P (Signed)
[facility] [place] NAME: Lauren Escobar    MR#:  [id number]  DATE OF BIRTH:  1966-10-04  DATE OF ADMISSION:  06/16/2017  PRIMARY CARE PHYSICIAN: Letta Median, MD   REQUESTING/REFERRING PHYSICIAN: Dr. Quentin Cornwall  CHIEF COMPLAINT:   Chief Complaint  Patient presents with  . Rectal Bleeding    HISTORY OF PRESENT ILLNESS:  Lauren Escobar  is a 51 y.o. female with a known history of One pill of branch disease, hypertension, diabetes, internal hemorrhoids, polycythemia vera presents to the hospital complaining of severe watery diarrhea since Saturday. On Sunday she noticed some bright red blood with stool. Since yesterday she has started having black tarry stools with epigastric pain. Patient feels dizzy is tachycardic in the emergency room. Her baseline hemoglobin is 15. Target with her hematologist for polycythemia vera is 14.5 with regular phlebotomy. Today her hemoglobin is 11.5. She does not take any anticoagulants. Had colonoscopy in 2016 which showed colon polyps and internal hemorrhoids.  PAST MEDICAL HISTORY:   Past Medical History:  Diagnosis Date  . Anxiety   . Asthma   . Bleeding disorder ([place])   . Blood dyscrasia    polycythemia vera  . Chronic kidney disease   . Depression   . Diabetes mellitus without complication ([place])   . Headache 2000  . Horizontal nystagmus    age 84  . Hypertension   . Von Willebrand's disease [facility])     PAST SURGICAL HISTORY:   Past Surgical History:  Procedure Laterality Date  . ABDOMINAL HYSTERECTOMY  2002   oophorectomy  . ABDOMINAL HYSTERECTOMY    . APPENDECTOMY    . BLADDER SURGERY    . BREAST BIOPSY Left    NEG  . CARDIAC CATHETERIZATION    . CARDIAC CATHETERIZATION N/A 07/17/2016   Procedure: Left Heart Cath and Coronary Angiography;  Surgeon: Corey Skains, MD;  Location: [place];  Service: Cardiovascular;  Laterality: N/A;  . COLONOSCOPY WITH PROPOFOL N/A  07/27/2015   Procedure: COLONOSCOPY WITH PROPOFOL;  Surgeon: Manya Silvas, MD;  Location: [facility] ENDOSCOPY;  Service: Endoscopy;  Laterality: N/A;  . REPLACEMENT TOTAL KNEE Left   . SHOULDER SURGERY Left    x 2    SOCIAL HISTORY:   Social History  Substance Use Topics  . Smoking status: Former Smoker    Quit date: 04/18/2014  . Smokeless tobacco: Never Used  . Alcohol use No    FAMILY HISTORY:   Family History  Problem Relation Age of Onset  . Cancer Father        prostate  . Stroke Father   . Diabetes Father   . Cancer Mother        bladder  . Breast cancer Maternal Grandmother     DRUG ALLERGIES:   Allergies  Allergen Reactions  . Aspirin   . Pregabalin Other (See Comments)    Unsteady gate, causing falls  . Tramadol Rash    REVIEW OF SYSTEMS:   Review of Systems  Constitutional: Positive for malaise/fatigue. Negative for chills, fever and weight loss.  HENT: Negative for hearing loss and nosebleeds.   Eyes: Negative for blurred vision, double vision and pain.  Respiratory: Negative for cough, hemoptysis, sputum production, shortness of breath and wheezing.   Cardiovascular: Negative for chest pain, palpitations, orthopnea and leg swelling.  Gastrointestinal: Positive for abdominal pain, blood in stool, diarrhea, melena and nausea. Negative for constipation and vomiting.  Genitourinary: Negative for  dysuria and hematuria.  Musculoskeletal: Negative for back pain, falls and myalgias.  Skin: Negative for rash.  Neurological: Positive for dizziness and weakness. Negative for tremors, sensory change, speech change, focal weakness, seizures and headaches.  Endo/Heme/Allergies: Does not bruise/bleed easily.  Psychiatric/Behavioral: Positive for depression. Negative for memory loss. The patient is not nervous/anxious.     MEDICATIONS AT HOME:   Prior to Admission medications   Medication Sig Start Date End Date Taking? Authorizing Provider  busPIRone (BUSPAR)  15 MG tablet Take by mouth 2 (two) times daily.    Yes [provider]  diazepam (VALIUM) 10 MG tablet Take 10 mg by mouth every 12 (twelve) hours as needed.  10/07/12  Yes [provider]  dicyclomine (BENTYL) 10 MG capsule Take 10 mg by mouth daily.   Yes [provider]  esomeprazole (NEXIUM) 40 MG capsule Take 40 mg by mouth daily.  08/07/14  Yes [provider]  estradiol (ESTRACE VAGINAL) 0.1 MG/GM vaginal cream Use small pea-sized application topically to the urethral meatus each night at bedtime. 11/10/14  Yes [provider]  flurbiprofen (ANSAID) 100 MG tablet Take 100 mg by mouth. 07/07/14  Yes [provider]  Fluticasone-Salmeterol (ADVAIR DISKUS) 250-50 MCG/DOSE AEPB Inhale 1 puff into the lungs 2 (two) times daily.  01/02/15  Yes [provider]  gabapentin (NEURONTIN) 300 MG capsule Take 900 mg by mouth 3 (three) times daily.  04/20/15 06/16/17 Yes [provider]  ipratropium-albuterol (DUONEB) 0.5-2.5 (3) MG/3ML SOLN 3 mL Q4H (RT).  07/11/14  Yes [provider]  isosorbide mononitrate (IMDUR) 30 MG 24 hr tablet Take 30 mg by mouth daily.   Yes [provider]  lisdexamfetamine (VYVANSE) 70 MG capsule Take 70 mg by mouth daily.  01/02/15  Yes [provider]  lurasidone (LATUDA) 40 MG TABS tablet Take by mouth daily with breakfast.  04/18/15  Yes [provider]  mirabegron ER (MYRBETRIQ) 50 MG TB24 tablet Take 50 mg by mouth daily.  07/28/14  Yes [provider]  nitrofurantoin (MACRODANTIN) 100 MG capsule Take 100 mg by mouth daily.    Yes [provider]  NITROGLYCERIN SL Place 1 tablet under the tongue.   Yes [provider]  nystatin cream (MYCOSTATIN) Apply topically. 06/07/13  Yes [provider]  propranolol (INDERAL) 40 MG tablet Take 40 mg by mouth 2 (two) times daily.  02/11/15  Yes [provider]  SUMAtriptan (IMITREX) 100 MG  tablet Take 100 mg by mouth once.  02/01/13  Yes [provider]  tamsulosin (FLOMAX) 0.4 MG CAPS capsule Take 0.4 mg by mouth daily after supper.  03/25/13  Yes [provider]  tiotropium (SPIRIVA HANDIHALER) 18 MCG inhalation capsule Frequency:BID   Dosage:18   MCG  Instructions:  Note:Dose: 18MCG 09/09/12  Yes [provider]  topiramate (TOPAMAX) 50 MG tablet Take 50 mg by mouth 2 (two) times daily.  04/20/15  Yes [provider]  Vilazodone HCl (VIIBRYD) 40 MG TABS Take 40 mg by mouth. 02/10/13  Yes [provider]  zaleplon (SONATA) 10 MG capsule Take 10 mg by mouth. 12/09/12  Yes [provider]  amphetamine-dextroamphetamine (ADDERALL) 10 MG tablet Take by mouth.    [provider]  ARIPIPRAZOLE PO Take 15 mg by mouth 2 (two) times daily.  12/24/14   [provider]  buPROPion (BUDEPRION XL) 300 MG 24 hr tablet Take 300 mg by mouth daily.  08/03/14   [provider]  diphenoxylate-atropine (LOMOTIL) 2.5-0.025 MG per tablet Take 2 tablets by mouth 4 (four) times daily as needed.  07/26/14   [provider]  metFORMIN (GLUCOPHAGE) 500 MG tablet Take 500 mg by mouth daily with breakfast.  09/09/12   [provider]  sucralfate (CARAFATE) 1 G tablet Take 1 g by mouth 4 (four) times daily -  with meals and at bedtime.  03/14/15 03/13/16  [provider]     VITAL SIGNS:  Blood pressure 118/72, pulse (!) 104, temperature 98.4 F (36.9 C), temperature source Oral, resp. rate 18, height 4\' 11"  (1.499 m), weight 74.4 kg (164 lb), SpO2 97 %.  PHYSICAL EXAMINATION:  Physical Exam  GENERAL:  51 y.o.-year-old patient lying in the bed with no acute distress.  EYES: Pupils equal, round, reactive to light and accommodation. No scleral icterus. Extraocular muscles intact.  HEENT: Head atraumatic, normocephalic. Oropharynx and nasopharynx clear. No oropharyngeal erythema, moist oral mucosa  NECK:  Supple,  no jugular venous distention. No thyroid enlargement, no tenderness.  LUNGS: Normal breath sounds bilaterally, no wheezing, rales, rhonchi. No use of accessory muscles of respiration.  CARDIOVASCULAR: S1, S2 normal. No murmurs, rubs, or gallops.  ABDOMEN: Soft,, nondistended. Bowel sounds present. No organomegaly or mass. Epigastric area tenderness  EXTREMITIES: No pedal edema, cyanosis, or clubbing. + 2 pedal & radial pulses b/l.   NEUROLOGIC: Cranial nerves II through XII are intact. No focal Motor or sensory deficits appreciated b/l PSYCHIATRIC: The patient is alert and oriented x 3. Good affect.  SKIN: No obvious rash, lesion, or ulcer.   LABORATORY PANEL:   CBC  Recent Labs Lab 06/16/17 1157  WBC 12.3*  HGB 11.7*  HCT 38.2  PLT 348   ------------------------------------------------------------------------------------------------------------------  Chemistries   Recent Labs Lab 06/16/17 1157  NA 141  K 3.6  CL 108  CO2 25  GLUCOSE 120*  BUN 8  CREATININE 0.76  CALCIUM 9.1  AST 43*  ALT 21  ALKPHOS 81  BILITOT 0.5   ------------------------------------------------------------------------------------------------------------------  Cardiac Enzymes No results for input(s): TROPONINI in the last 168 hours. ------------------------------------------------------------------------------------------------------------------  RADIOLOGY:  Ct Abdomen Pelvis W Contrast  Result Date: 06/16/2017 CLINICAL DATA:  Abdominal and rectal pain with GI bleeding. Bleeding disorder (polycythemia vera). Previous hysterectomy and appendectomy. EXAM: CT ABDOMEN AND PELVIS WITH CONTRAST TECHNIQUE: Multidetector CT imaging of the abdomen and pelvis was performed using the standard protocol following bolus administration of intravenous contrast. CONTRAST:  127mL ISOVUE-300 IOPAMIDOL (ISOVUE-300) INJECTION 61% COMPARISON:  CT 11/27/2016. FINDINGS: Lower chest: There is stable minimal nodularity  at the left lung base. No significant pleural or pericardial effusion. Hepatobiliary: Mild hepatic steatosis appears improved. No focal hepatic abnormality or abnormal enhancement identified. No evidence of gallstones, gallbladder wall thickening or biliary dilatation. Pancreas: Unremarkable. No pancreatic ductal dilatation or surrounding inflammatory changes. Spleen: Normal in size without focal abnormality. Adrenals/Urinary Tract: Both adrenal glands appear normal. The kidneys and ureters appear normal. No evidence of urinary tract calculus or hydronephrosis. Possible mild bladder wall thickening without residual air in the bladder lumen or focal urothelial lesion. Stomach/Bowel: No evidence of bowel wall thickening, distention or surrounding inflammatory change. There is stable fat deposition within the colonic wall. Postsurgical changes from previous appendectomy are noted. Vascular/Lymphatic: There are no enlarged abdominal or pelvic lymph nodes. No significant vascular findings are present. Reproductive: Hysterectomy.  No evidence of adnexal mass. Other: No evidence of abdominal wall mass or hernia. No ascites. Musculoskeletal: No acute or significant osseous findings. Pelvic stimulator again  noted with single lead traversing the right S3 foramen. IMPRESSION: 1. No acute abdominopelvic findings or explanation for GI bleeding identified. 2. Chronic colonic wall fat deposition may be secondary to body habitus or chronic inflammation. No surrounding inflammatory changes. 3. Possible mild bladder wall thickening. 4. Improved hepatic steatosis. 5. Pelvic stimulator noted. Electronically Signed   By: Richardean Sale M.D.   On: 06/16/2017 16:11     IMPRESSION AND PLAN:   * GI bleed, Likely upper GI bleed due to melanoma. Will admit as inpatient. Serial hemoglobin checks. Start Protonix drip. We'll bolus 1 L normal saline. Baseline hemoglobin is close to 14.5-15. Today she is at 11.5. Tachycardic. Feels  dizzy. Will need transfusion if there is further drop. Patient does have von Willebrand's disease. Received 1 dose of desmopressin in the emergency room.  * Diarrhea. This is worsening of her mild chronic diarrhea. Will need to check for stool PCR and C. difficile.   * Diabetes mellitus. Sliding scale insulin.  * Asthma. He is stable. Continue home inhalers and nebulizers.  * DVT prophylaxis with SCDs  All the records are reviewed and case discussed with ED provider. Management plans discussed with the patient, family and they are in agreement.  CODE STATUS: FULL CODE  TOTAL TIME TAKING CARE OF THIS PATIENT: 35 minutes.   Hillary Bow R M.D on 06/16/2017 at 5:01 PM  Between 7am to 6pm - Pager - [phone]  After 6pm go to [email or web address] - password [email or web address] [facility]  Office  [phone]  CC: Primary care physician; Letta Median, MD  Note: This dictation was prepared with Dragon dictation along with smaller phrase technology. Any transcriptional errors that result from this process are unintentional.

## 2017-06-16 NOTE — ED Provider Notes (Signed)
[facility] Emergency Department Provider Note    First MD Initiated Contact with Patient 06/16/17 1434     (approximate)  I have reviewed the triage vital signs and the nursing notes.   HISTORY  Chief Complaint Rectal Bleeding    HPI Lauren Escobar is a 51 y.o. female chief complaint of profuse bloody diarrhea that initially was hematochezia now turning into melena associated with worsening shortness of breath weakness and fatigue over the past day or so. Patient does have a history of IBS as well as von Willebrand's disease as well as polycythemia vera. Is not taking any blood thinners. Has not had any coffee ground emesis.   Past Medical History:  Diagnosis Date  . Anxiety   . Asthma   . Bleeding disorder ([place])   . Blood dyscrasia    polycythemia vera  . Chronic kidney disease   . Depression   . Diabetes mellitus without complication ([place])   . Headache 2000  . Horizontal nystagmus    age 69  . Hypertension   . Von Willebrand's disease [facility])    Family History  Problem Relation Age of Onset  . Cancer Father        prostate  . Stroke Father   . Diabetes Father   . Cancer Mother        bladder  . Breast cancer Maternal Grandmother    Past Surgical History:  Procedure Laterality Date  . ABDOMINAL HYSTERECTOMY  2002   oophorectomy  . ABDOMINAL HYSTERECTOMY    . APPENDECTOMY    . BLADDER SURGERY    . BREAST BIOPSY Left    NEG  . CARDIAC CATHETERIZATION    . CARDIAC CATHETERIZATION N/A 07/17/2016   Procedure: Left Heart Cath and Coronary Angiography;  Surgeon: Corey Skains, MD;  Location: [place] CV LAB;  Service: Cardiovascular;  Laterality: N/A;  . COLONOSCOPY WITH PROPOFOL N/A 07/27/2015   Procedure: COLONOSCOPY WITH PROPOFOL;  Surgeon: Manya Silvas, MD;  Location: [facility];  Service: Endoscopy;  Laterality: N/A;  . REPLACEMENT TOTAL KNEE Left   . SHOULDER SURGERY Left    x 2   Patient Active Problem List   Diagnosis Date Noted  . GI bleed 06/16/2017  . Pain in joint, shoulder region (secondary) (right greater than left) 04/16/2017  . Knee pain (primary) (greater than right) 04/16/2017  . Pain due to interstitial cystitis (tertiary) 04/16/2017  . COPD (chronic obstructive pulmonary disease) ([place]) 04/15/2017  . Depression 04/15/2017  . GERD (gastroesophageal reflux disease) 04/15/2017  . Migraines 04/15/2017  . Chronic pain syndrome 04/15/2017  . Long term current use of opiate analgesic 04/15/2017  . Long term prescription opiate use 04/15/2017  . Opiate use 04/15/2017  . Urinary retention 02/05/2017  . Bladder outlet obstruction 01/21/2017  . Early satiety 12/19/2016  . Epigastric pain 11/21/2016  . Diarrhea, unspecified 11/21/2016  . Dysuria 11/21/2016  . 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 Urinary tract infection 09/27/2016  . Chest pain, unspecified 07/31/2016  . S/P cardiac cath 07/31/2016  . Unstable angina ([place]) 07/12/2016  . Balance problem 12/27/2015  . Chronic hip pain (tertiary) (bilateral) (left greater than right) 08/09/2015  . Abdominal pain, acute, right lower quadrant 05/18/2015  . Polycythemia vera ([place]) 04/20/2015  . Pain medication agreement signed 04/20/2015  . Candidiasis of female genitalia 04/05/2015  . Benign essential hypertension 03/31/2015  . Mechanical breakdown of implanted electronic neurostimulator of peripheral nerve ([place]) 01/28/2015  .  Mixed hyperlipidemia 08/29/2014  . Asthma without status asthmaticus 07/28/2014  . Detrusor instability 07/28/2014  . Chest pain 07/15/2014  . Chronic cough 07/15/2014  . Chronic daily headache 07/15/2014  . 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 Headache disorder 07/15/2014  . Nystagmus, congenital 07/15/2014  . Type I diabetes mellitus ([place]) 05/23/2014  . Diabetic peripheral neuropathy ([place]) 06/25/2013  . Chronic cystitis 03/25/2013  . [place] of bladder 02/18/2013  . Urinary  incontinence without sensory awareness 02/18/2013  . Female stress incontinence 01/20/2013  . Functional disorder of bladder 01/20/2013  . Urge incontinence 01/20/2013  . Chronic interstitial cystitis 07/02/2012  . Dyspareunia 07/02/2012  . Gross hematuria 07/02/2012  . Incomplete emptying of bladder 07/02/2012  . Mixed urge and stress incontinence 07/02/2012  . Increased frequency of urination 07/02/2012  . Von Willebrand's disease ([place]) 07/02/2012      Prior to Admission medications   Medication Sig Start Date End Date Taking? Authorizing Provider  busPIRone (BUSPAR) 15 MG tablet Take by mouth 2 (two) times daily.    Yes [provider]  diazepam (VALIUM) 10 MG tablet Take 10 mg by mouth every 12 (twelve) hours as needed.  10/07/12  Yes [provider]  dicyclomine (BENTYL) 10 MG capsule Take 10 mg by mouth daily.   Yes [provider]  esomeprazole (NEXIUM) 40 MG capsule Take 40 mg by mouth daily.  08/07/14  Yes [provider]  estradiol (ESTRACE VAGINAL) 0.1 MG/GM vaginal cream Use small pea-sized application topically to the urethral meatus each night at bedtime. 11/10/14  Yes [provider]  flurbiprofen (ANSAID) 100 MG tablet Take 100 mg by mouth. 07/07/14  Yes [provider]  Fluticasone-Salmeterol (ADVAIR DISKUS) 250-50 MCG/DOSE AEPB Inhale 1 puff into the lungs 2 (two) times daily.  01/02/15  Yes [provider]  gabapentin (NEURONTIN) 300 MG capsule Take 900 mg by mouth 3 (three) times daily.  04/20/15 06/16/17 Yes [provider]  ipratropium-albuterol (DUONEB) 0.5-2.5 (3) MG/3ML SOLN 3 mL Q4H (RT).  07/11/14  Yes [provider]  isosorbide mononitrate (IMDUR) 30 MG 24 hr tablet Take 30 mg by mouth daily.   Yes [provider]  lisdexamfetamine (VYVANSE) 70 MG capsule Take 70 mg by mouth daily.  01/02/15  Yes [provider]  lurasidone (LATUDA) 40 MG TABS tablet Take by mouth daily  with breakfast.  04/18/15  Yes [provider]  mirabegron ER (MYRBETRIQ) 50 MG TB24 tablet Take 50 mg by mouth daily.  07/28/14  Yes [provider]  nitrofurantoin (MACRODANTIN) 100 MG capsule Take 100 mg by mouth daily.    Yes [provider]  NITROGLYCERIN SL Place 1 tablet under the tongue.   Yes [provider]  nystatin cream (MYCOSTATIN) Apply topically. 06/07/13  Yes [provider]  propranolol (INDERAL) 40 MG tablet Take 40 mg by mouth 2 (two) times daily.  02/11/15  Yes [provider]  SUMAtriptan (IMITREX) 100 MG tablet Take 100 mg by mouth once.  02/01/13  Yes [provider]  tamsulosin (FLOMAX) 0.4 MG CAPS capsule Take 0.4 mg by mouth daily after supper.  03/25/13  Yes [provider]  tiotropium (SPIRIVA HANDIHALER) 18 MCG inhalation capsule Frequency:BID   Dosage:18   MCG  Instructions:  Note:Dose: 18MCG 09/09/12  Yes [provider]  topiramate (TOPAMAX) 50 MG tablet Take 50 mg by mouth 2 (two) times daily.  04/20/15  Yes [provider]  Vilazodone HCl (VIIBRYD) 40 MG TABS Take 40  mg by mouth. 02/10/13  Yes [provider]  zaleplon (SONATA) 10 MG capsule Take 10 mg by mouth. 12/09/12  Yes [provider]  amphetamine-dextroamphetamine (ADDERALL) 10 MG tablet Take by mouth.    [provider]  ARIPIPRAZOLE PO Take 15 mg by mouth 2 (two) times daily.  12/24/14   [provider]  buPROPion (BUDEPRION XL) 300 MG 24 hr tablet Take 300 mg by mouth daily.  08/03/14   [provider]  diphenoxylate-atropine (LOMOTIL) 2.5-0.025 MG per tablet Take 2 tablets by mouth 4 (four) times daily as needed.  07/26/14   [provider]  metFORMIN (GLUCOPHAGE) 500 MG tablet Take 500 mg by mouth daily with breakfast.  09/09/12   [provider]  sucralfate (CARAFATE) 1 G tablet Take 1 g by mouth 4 (four) times daily -  with meals and at bedtime.  03/14/15 03/13/16   [provider]    Allergies Aspirin; Pregabalin; and Tramadol    Social History Social History  Substance Use Topics  . Smoking status: Former Smoker    Quit date: 06/18/2014  . Smokeless tobacco: Never Used  . Alcohol use No    Review of Systems Patient denies headaches, rhinorrhea, blurry vision, numbness, shortness of breath, chest pain, edema, cough, abdominal pain, nausea, vomiting, diarrhea, dysuria, fevers, rashes or hallucinations unless otherwise stated above in HPI. ____________________________________________   PHYSICAL EXAM:  VITAL SIGNS: Vitals:   06/16/17 1730 06/16/17 1757  BP: 122/71 120/64  Pulse: 100 (!) 103  Resp: 17   Temp:  97.8 F (36.6 C)  SpO2: 100% 100%    Constitutional: Alert and oriented.  in no acute distress. Eyes: Conjunctivae are normal.  Head: Atraumatic. Nose: No congestion/rhinnorhea. Mouth/Throat: Mucous membranes are moist.   Neck: No stridor. Painless ROM.  Cardiovascular: Normal rate, regular rhythm. Grossly normal heart sounds.  Good peripheral circulation. Respiratory: Normal respiratory effort.  No retractions. Lungs CTAB. Gastrointestinal: Soft and nontender. No distention. No abdominal bruits. No CVA tenderness. Genitourinary: guaiac positive stools, perirectal dermatitis, no abscess or induration Musculoskeletal: No lower extremity tenderness nor edema.  No joint effusions. Neurologic:  Normal speech and language. No gross focal neurologic deficits are appreciated. No facial droop Skin:  Skin is warm, dry and intact. No rash noted. Psychiatric: Mood and affect are normal. Speech and behavior are normal.  ____________________________________________   LABS (all labs ordered are listed, but only abnormal results are displayed)  Results for orders placed or performed during the hospital encounter of 06/16/17 (from the past 24 hour(s))  Comprehensive metabolic panel     Status: Abnormal   Collection Time:  06/16/17 11:57 AM  Result Value Ref Range   Sodium 141 135 - 145 mmol/L   Potassium 3.6 3.5 - 5.1 mmol/L   Chloride 108 101 - 111 mmol/L   CO2 25 22 - 32 mmol/L   Glucose, Bld 120 (H) 65 - 99 mg/dL   BUN 8 6 - 20 mg/dL   Creatinine, Ser 0.76 0.44 - 1.00 mg/dL   Calcium 9.1 8.9 - 10.3 mg/dL   Total Protein 7.1 6.5 - 8.1 g/dL   Albumin 3.8 3.5 - 5.0 g/dL   AST 43 (H) 15 - 41 U/L   ALT 21 14 - 54 U/L   Alkaline Phosphatase 81 38 - 126 U/L   Total Bilirubin 0.5 0.3 - 1.2 mg/dL   GFR calc non Af Amer >60 >60 mL/min   GFR calc Af Amer >60 >60 mL/min  Anion gap 8 5 - 15  CBC     Status: Abnormal   Collection Time: 06/16/17 11:57 AM  Result Value Ref Range   WBC 12.3 (H) 3.6 - 11.0 K/uL   RBC 5.90 (H) 3.80 - 5.20 MIL/uL   Hemoglobin 11.7 (L) 12.0 - 16.0 g/dL   HCT 38.2 35.0 - 47.0 %   MCV 64.7 (L) 80.0 - 100.0 fL   MCH 19.8 (L) 26.0 - 34.0 pg   MCHC 30.6 (L) 32.0 - 36.0 g/dL   RDW 19.5 (H) 11.5 - 14.5 %   Platelets 348 150 - 440 K/uL  Type and screen [facility]     Status: None   Collection Time: 06/16/17 11:57 AM  Result Value Ref Range   ABO/RH(D) O POS    Antibody Screen NEG    Sample Expiration 06/19/2017   Glucose, capillary     Status: Abnormal   Collection Time: 06/16/17  6:06 PM  Result Value Ref Range   Glucose-Capillary 105 (H) 65 - 99 mg/dL   ____________________________________________ ____________________________________________  RADIOLOGY  I personally reviewed all radiographic images ordered to evaluate for the above acute complaints and reviewed radiology reports and findings.  These findings were personally discussed with the patient.  Please see medical record for radiology report.  ____________________________________________   PROCEDURES  Procedure(s) performed:  Procedures    Critical Care performed: no ____________________________________________   INITIAL IMPRESSION / ASSESSMENT AND PLAN / ED COURSE  Pertinent labs  & imaging results that were available during my care of the patient were reviewed by me and considered in my medical decision making (see chart for details).  DDX: ugib, lgib, colitis, uc, ibd, dehydration, colitis  Lauren Escobar is a 51 y.o. who presents to the ED with symptoms as described above. Patient afebrile and hemodynamically stable. Based on exam above CT imaging ordered for the above differential shows no evidence of colitis or ulcerative colitis. No evidence of diverticulosis. Based on her blood dyscrasia patient was given DDAVP for concern for active bleeding. This point do not feel that transfusion emergently indicated but I do feel the patient would benefit from admission to hospital for further evaluation and management.      ____________________________________________   FINAL CLINICAL IMPRESSION(S) / ED DIAGNOSES  Final diagnoses:  Acute GI bleeding  Von Willebrand disease ([place])      NEW MEDICATIONS STARTED DURING THIS VISIT:  Current Discharge Medication List       Note:  This document was prepared using Dragon voice recognition software and may include unintentional dictation errors.    Merlyn Lot, MD 06/16/17 2010

## 2017-06-16 NOTE — ED Notes (Signed)
Spoke with pharmacy, they are working on medications

## 2017-06-17 DIAGNOSIS — K921 Melena: Secondary | ICD-10-CM

## 2017-06-17 LAB — URINALYSIS, ROUTINE W REFLEX MICROSCOPIC
Bilirubin Urine: NEGATIVE
Glucose, UA: NEGATIVE mg/dL
Hgb urine dipstick: NEGATIVE
Ketones, ur: NEGATIVE mg/dL
Nitrite: NEGATIVE
Protein, ur: NEGATIVE mg/dL
SPECIFIC GRAVITY, URINE: 1.016 (ref 1.005–1.030)
pH: 5 (ref 5.0–8.0)

## 2017-06-17 LAB — BASIC METABOLIC PANEL
Anion gap: 4 — ABNORMAL LOW (ref 5–15)
BUN: 6 mg/dL (ref 6–20)
CHLORIDE: 111 mmol/L (ref 101–111)
CO2: 26 mmol/L (ref 22–32)
CREATININE: 0.74 mg/dL (ref 0.44–1.00)
Calcium: 7.7 mg/dL — ABNORMAL LOW (ref 8.9–10.3)
GFR calc Af Amer: >60 mL/min (ref >60)
GFR calc non Af Amer: >60 mL/min (ref >60)
GLUCOSE: 106 mg/dL — AB (ref 65–99)
POTASSIUM: 2.8 mmol/L — AB (ref 3.5–5.1)
SODIUM: 141 mmol/L (ref 135–145)

## 2017-06-17 LAB — CBC
HCT: 33.6 % — ABNORMAL LOW (ref 35.0–47.0)
Hemoglobin: 10.4 g/dL — ABNORMAL LOW (ref 12.0–16.0)
MCH: 20.7 pg — ABNORMAL LOW (ref 26.0–34.0)
MCHC: 31 g/dL — ABNORMAL LOW (ref 32.0–36.0)
MCV: 66.6 fL — AB (ref 80.0–100.0)
Platelets: 249 10*3/uL (ref 150–440)
RBC: 5.05 MIL/uL (ref 3.80–5.20)
RDW: 19.3 % — ABNORMAL HIGH (ref 11.5–14.5)
WBC: 8.4 10*3/uL (ref 3.6–11.0)

## 2017-06-17 LAB — POTASSIUM: Potassium: 3.6 mmol/L (ref 3.5–5.1)

## 2017-06-17 LAB — GLUCOSE, CAPILLARY
GLUCOSE-CAPILLARY: 101 mg/dL — AB (ref 65–99)
GLUCOSE-CAPILLARY: 102 mg/dL — AB (ref 65–99)
GLUCOSE-CAPILLARY: 121 mg/dL — AB (ref 65–99)
Glucose-Capillary: 97 mg/dL (ref 65–99)

## 2017-06-17 LAB — HEMOGLOBIN A1C
Hgb A1c MFr Bld: 5.5 % (ref 4.8–5.6)
Mean Plasma Glucose: 111.15 mg/dL

## 2017-06-17 LAB — MAGNESIUM: MAGNESIUM: 1.7 mg/dL (ref 1.7–2.4)

## 2017-06-17 MED ORDER — POTASSIUM CHLORIDE 10 MEQ/100ML IV SOLN
10.0000 meq | INTRAVENOUS | Status: AC
  Administered 2017-06-17 (×4): 10 meq via INTRAVENOUS
  Filled 2017-06-17 (×4): qty 100

## 2017-06-17 MED ORDER — DEXTROSE 5 % IV SOLN
2.0000 g | INTRAVENOUS | Status: DC
  Filled 2017-06-17: qty 2

## 2017-06-17 MED ORDER — BUSPIRONE HCL 5 MG PO TABS
15.0000 mg | ORAL_TABLET | Freq: Two times a day (BID) | ORAL | Status: DC
  Administered 2017-06-17: 15 mg via ORAL
  Filled 2017-06-17 (×4): qty 3

## 2017-06-17 MED ORDER — DEXTROSE 5 % IV SOLN
2.0000 g | Freq: Once | INTRAVENOUS | Status: AC
  Administered 2017-06-18: 2 g via INTRAVENOUS
  Filled 2017-06-17: qty 2

## 2017-06-17 NOTE — Progress Notes (Signed)
Pt complained of increased frequency, burning while urinating; Primary nurse notified Dr. Darvin Neighbours.. Orders received for U/A

## 2017-06-17 NOTE — Progress Notes (Signed)
Called Dr. Benjie Karvonen to discontinue cardiac monitor order and also the patient talked to  PT about multiple times a day she looses feeling in her arms and legs and that's what she feels is causing her falls.  Dr. Benjie Karvonen gave me a verbal order to discontinue telemetry and she just acknowledged about the feeling and said she would talk to her tomorrow.

## 2017-06-17 NOTE — Progress Notes (Signed)
Talked to Dr. Benjie Karvonen about the patient.  I asked her if it were ok to given her oral medications even though patient is NPO; she gave me a verbal order that it was ok to give medications.  I also mentioned to her that there were 3 medications that were ordered for the patient by on the PTA med list it said patient did not take the medications; she gave me a verbal order to discontinue the Adderall, Abilify, and Bupropion.  I also mentioned to her that the patient did not have much urine output last night and she gave me an order to increase there NS rate to 149ml/hr.

## 2017-06-17 NOTE — Consult Note (Addendum)
Cephas Darby, MD [address]  [place]  [place], [place] [address]  Main: [phone]  Fax: [phone] Pager: [phone]   Consultation  Referring Provider:     No ref. provider found Primary Care Physician:  Letta Median, MD Primary Gastroenterologist:  Dr. Vira Agar        Reason for Consultation:     Lower abdominal pain, diarrhea  Date of Admission:  06/16/2017 Date of Consultation:  06/17/2017         HPI:   Lauren Escobar is a 51 y.o. female with past medical history as described below, significant for von Willebrand's disease, polycythemia, IBS-diarrhea, GERD, microcytosis secondary to iron deficiency secondary to serial phlebotomies for polycythemia vera. She is admitted with lower abdominal pain and diarrhea that started on Saturday. Prior to this she was having regular bowel movements. She took ibuprofen 800 mg on Saturday and Sunday, followed by this has noticed dark tarry stools. She did not have bowel movements today. She had CT A/P in the ER and it did not show any acute pathology. She reports being on several different antibiotics for bladder infections in last few weeks. She denies recent travel, consuming outside food. She denies fever, chills  GI Procedures: Colonoscopy on 07/27/2015 by Dr. Vira Agar, tubular adenoma in sigmoid colon  Colonoscopy 01/09/2012 Diagnosis:  DESCENDING COLON MUCOSAL COLD BIOPSY:  - COLONIC MUCOSA WITH NO PATHOLOGIC ABNORMALITY.  - NEGATIVE FOR DYSPLASIA AND MALIGNANCY.   Colonoscopy 08/24/2010 Diagnosis:  Part A: ASCENDING COLON COLD BIOPSIES:  - NO COLITIS.  Marland Kitchen  Part B: TRANSVERSE COLON COLD BIOPSIES:  - NO COLITIS.  Marland Kitchen  Part C: SIGMOID COLON COLD BIOPSIES:  - NO COLITIS.   Past Medical History:  Diagnosis Date  . Anxiety   . Asthma   . Bleeding disorder ([place])   . Blood dyscrasia    polycythemia vera  . Chronic kidney disease   . Depression   . Diabetes mellitus without complication ([place])   .  Headache 2000  . Horizontal nystagmus    age 61  . Hypertension   . Von Willebrand's disease [facility])     Past Surgical History:  Procedure Laterality Date  . ABDOMINAL HYSTERECTOMY  2002   oophorectomy  . ABDOMINAL HYSTERECTOMY    . APPENDECTOMY    . BLADDER SURGERY    . BREAST BIOPSY Left    NEG  . CARDIAC CATHETERIZATION    . CARDIAC CATHETERIZATION N/A 07/17/2016   Procedure: Left Heart Cath and Coronary Angiography;  Surgeon: Corey Skains, MD;  Location: [place] [facility];  Service: Cardiovascular;  Laterality: N/A;  . COLONOSCOPY WITH PROPOFOL N/A 07/27/2015   Procedure: COLONOSCOPY WITH PROPOFOL;  Surgeon: Manya Silvas, MD;  Location: [facility] ENDOSCOPY;  Service: Endoscopy;  Laterality: N/A;  . REPLACEMENT TOTAL KNEE Left   . SHOULDER SURGERY Left    x 2    Prior to Admission medications   Medication Sig Start Date End Date Taking? Authorizing Provider  busPIRone (BUSPAR) 15 MG tablet Take by mouth 2 (two) times daily.    Yes [provider]  diazepam (VALIUM) 10 MG tablet Take 10 mg by mouth every 12 (twelve) hours as needed.  10/07/12  Yes [provider]  dicyclomine (BENTYL) 10 MG capsule Take 10 mg by mouth daily.   Yes [provider]  esomeprazole (NEXIUM) 40 MG capsule Take 40 mg by mouth daily.  08/07/14  Yes [provider]  estradiol (ESTRACE  VAGINAL) 0.1 MG/GM vaginal cream Use small pea-sized application topically to the urethral meatus each night at bedtime. 11/10/14  Yes [provider]  flurbiprofen (ANSAID) 100 MG tablet Take 100 mg by mouth. 07/07/14  Yes [provider]  Fluticasone-Salmeterol (ADVAIR DISKUS) 250-50 MCG/DOSE AEPB Inhale 1 puff into the lungs 2 (two) times daily.  01/02/15  Yes [provider]  gabapentin (NEURONTIN) 300 MG capsule Take 900 mg by mouth 3 (three) times daily.  04/20/15 06/16/17 Yes [provider]  ipratropium-albuterol (DUONEB) 0.5-2.5 (3) MG/3ML SOLN 3  mL Q4H (RT).  07/11/14  Yes [provider]  isosorbide mononitrate (IMDUR) 30 MG 24 hr tablet Take 30 mg by mouth daily.   Yes [provider]  lisdexamfetamine (VYVANSE) 70 MG capsule Take 70 mg by mouth daily.  01/02/15  Yes [provider]  lurasidone (LATUDA) 40 MG TABS tablet Take by mouth daily with breakfast.  04/18/15  Yes [provider]  mirabegron ER (MYRBETRIQ) 50 MG TB24 tablet Take 50 mg by mouth daily.  07/28/14  Yes [provider]  nitrofurantoin (MACRODANTIN) 100 MG capsule Take 100 mg by mouth daily.    Yes [provider]  NITROGLYCERIN SL Place 1 tablet under the tongue.   Yes [provider]  nystatin cream (MYCOSTATIN) Apply topically. 06/07/13  Yes [provider]  propranolol (INDERAL) 40 MG tablet Take 40 mg by mouth 2 (two) times daily.  02/11/15  Yes [provider]  SUMAtriptan (IMITREX) 100 MG tablet Take 100 mg by mouth once.  02/01/13  Yes [provider]  tamsulosin (FLOMAX) 0.4 MG CAPS capsule Take 0.4 mg by mouth daily after supper.  03/25/13  Yes [provider]  tiotropium (SPIRIVA HANDIHALER) 18 MCG inhalation capsule Frequency:BID   Dosage:18   MCG  Instructions:  Note:Dose: 18MCG 09/09/12  Yes [provider]  topiramate (TOPAMAX) 50 MG tablet Take 50 mg by mouth 2 (two) times daily.  04/20/15  Yes [provider]  Vilazodone HCl (VIIBRYD) 40 MG TABS Take 40 mg by mouth. 02/10/13  Yes [provider]  zaleplon (SONATA) 10 MG capsule Take 10 mg by mouth. 12/09/12  Yes [provider]  amphetamine-dextroamphetamine (ADDERALL) 10 MG tablet Take by mouth.    [provider]  ARIPIPRAZOLE PO Take 15 mg by mouth 2 (two) times daily.  12/24/14   [provider]  buPROPion (BUDEPRION XL) 300 MG 24 hr tablet Take 300 mg by mouth daily.  08/03/14   [provider]  diphenoxylate-atropine (LOMOTIL) 2.5-0.025 MG per tablet  Take 2 tablets by mouth 4 (four) times daily as needed.  07/26/14   [provider]  metFORMIN (GLUCOPHAGE) 500 MG tablet Take 500 mg by mouth daily with breakfast.  09/09/12   [provider]  sucralfate (CARAFATE) 1 G tablet Take 1 g by mouth 4 (four) times daily -  with meals and at bedtime.  03/14/15 03/13/16  [provider]    Family History  Problem Relation Age of Onset  . Cancer Father        prostate  . Stroke Father   . Diabetes Father   . Cancer Mother        bladder  . Breast cancer Maternal Grandmother      Social History  Substance Use Topics  . Smoking status: Former Smoker    Quit date: 06/18/2014  . Smokeless tobacco: Never Used  . Alcohol use No    Allergies  as of 06/16/2017 - Review Complete 06/16/2017  Allergen Reaction Noted  . Aspirin  06/16/2017  . Pregabalin Other (See Comments) 04/21/2015  . Tramadol Rash 12/12/2015    Review of Systems:    All systems reviewed and negative except where noted in HPI.   Physical Exam:  Vital signs in last 24 hours: Temp:  [97.8 F (36.6 C)-98.4 F (36.9 C)] 97.8 F (36.6 C) (08/21 0458) Pulse Rate:  [82-114] 82 (08/21 1028) Resp:  [16-21] 21 (08/21 0458) BP: (94-137)/(63-89) 109/63 (08/21 1028) SpO2:  [97 %-100 %] 99 % (08/21 1028) Weight:  [73.9 kg (163 lb)-74.7 kg (164 lb 11.2 oz)] 74.7 kg (164 lb 11.2 oz) (08/21 0421) Last BM Date: 06/16/17 General:   Pleasant, cooperative in NAD Head:  Normocephalic and atraumatic. Eyes:   No icterus.   Conjunctiva pink. PERRLA. Ears:  Normal auditory acuity. Neck:  Supple; no masses or thyroidomegaly Lungs: Respirations even and unlabored. Lungs clear to auscultation bilaterally.   No wheezes, crackles, or rhonchi.  Heart:  Regular rate and rhythm;  Without murmur, clicks, rubs or gallops Abdomen:  Soft, nondistended, mild lower abdominal tenderness, predominantly in the right lower quadrant. Normal bowel sounds. No appreciable masses or  hepatomegaly.  No rebound or guarding.  Rectal:  Not performed. Msk:  Symmetrical without gross deformities.   Extremities:  Without edema, cyanosis or clubbing. Neurologic:  Alert and oriented x3;  grossly normal neurologically. Skin:  Intact without significant lesions or rashes. Cervical Nodes:  No significant cervical adenopathy. Psych:  Alert and cooperative. Normal affect.  LAB RESULTS:  Recent Labs  06/16/17 1157 06/16/17 2040 06/17/17 0523  WBC 12.3*  --  8.4  HGB 11.7* 10.2* 10.4*  HCT 38.2  --  33.6*  PLT 348  --  249   BMET  Recent Labs  06/16/17 1157 06/17/17 0523  NA 141 141  K 3.6 2.8*  CL 108 111  CO2 25 26  GLUCOSE 120* 106*  BUN 8 6  CREATININE 0.76 0.74  CALCIUM 9.1 7.7*   LFT  Recent Labs  06/16/17 1157  PROT 7.1  ALBUMIN 3.8  AST 43*  ALT 21  ALKPHOS 81  BILITOT 0.5   PT/INR No results for input(s): LABPROT, INR in the last 72 hours.  STUDIES: Ct Abdomen Pelvis W Contrast  Result Date: 06/16/2017 CLINICAL DATA:  Abdominal and rectal pain with GI bleeding. Bleeding disorder (polycythemia vera). Previous hysterectomy and appendectomy. EXAM: CT ABDOMEN AND PELVIS WITH CONTRAST TECHNIQUE: Multidetector CT imaging of the abdomen and pelvis was performed using the standard protocol following bolus administration of intravenous contrast. CONTRAST:  145mL ISOVUE-300 IOPAMIDOL (ISOVUE-300) INJECTION 61% COMPARISON:  CT 11/27/2016. FINDINGS: Lower chest: There is stable minimal nodularity at the left lung base. No significant pleural or pericardial effusion. Hepatobiliary: Mild hepatic steatosis appears improved. No focal hepatic abnormality or abnormal enhancement identified. No evidence of gallstones, gallbladder wall thickening or biliary dilatation. Pancreas: Unremarkable. No pancreatic ductal dilatation or surrounding inflammatory changes. Spleen: Normal in size without focal abnormality. Adrenals/Urinary Tract: Both adrenal glands appear normal.  The kidneys and ureters appear normal. No evidence of urinary tract calculus or hydronephrosis. Possible mild bladder wall thickening without residual air in the bladder lumen or focal urothelial lesion. Stomach/Bowel: No evidence of bowel wall thickening, distention or surrounding inflammatory change. There is stable fat deposition within the colonic wall. Postsurgical changes from previous appendectomy are noted. Vascular/Lymphatic: There are no enlarged abdominal or pelvic lymph nodes. No significant vascular findings  are present. Reproductive: Hysterectomy.  No evidence of adnexal mass. Other: No evidence of abdominal wall mass or hernia. No ascites. Musculoskeletal: No acute or significant osseous findings. Pelvic stimulator again noted with single lead traversing the right S3 foramen. IMPRESSION: 1. No acute abdominopelvic findings or explanation for GI bleeding identified. 2. Chronic colonic wall fat deposition may be secondary to body habitus or chronic inflammation. No surrounding inflammatory changes. 3. Possible mild bladder wall thickening. 4. Improved hepatic steatosis. 5. Pelvic stimulator noted. Electronically Signed   By: Richardean Sale M.D.   On: 06/16/2017 16:11      Impression / Plan:   MISTEE SOLIMAN is a 51 y.o. y/o female with Von Willebrand's disease, polycythemia vera on phlebotomies resulting in iron deficiency and microcytic anemia, IBS-D is a regular patient of Dr. Vira Agar admitted with recurrent abdominal pain and worsening diarrhea. This could very well be exacerbation of her underlying IBS-D. However, given recent use of different antibiotics for bladder infection, recommend stool studies to rule out C. difficile. If this is unremarkable, we can start symptomatic management for her IBS-D. She can start full liquid diet. Continue dicyclomine as needed. She tried rifaximin in the past but she did not tolerate it and did not find it helpful either.  Dark tarry stools: History  of NSAID use, she has mild drop in hemoglobin from baseline. Agree with IV PPI - Nothing by mouth past midnight - EGD tomorrow  Thank you for involving me in the care of this patient.      LOS: 1 day   Sherri Sear, MD  06/17/2017, 10:30 AM   Note: This dictation was prepared with Dragon dictation along with smaller phrase technology. Any transcriptional errors that result from this process are unintentional.

## 2017-06-17 NOTE — Progress Notes (Signed)
Pharmacy Antibiotic Note  Lauren Escobar is a 51 y.o. female admitted on 06/16/2017 with UTI.  Pharmacy has been consulted for ceftriaxone dosing.  Plan: Ceftriaxone 2 grams q 24 hours ordered.  Height: 4\' 11"  (149.9 cm) Weight: 164 lb 11.2 oz (74.7 kg) IBW/kg (Calculated) : 43.2  Temp (24hrs), Avg:97.9 F (36.6 C), Min:97.8 F (36.6 C), Max:98 F (36.7 C)   Recent Labs Lab 06/16/17 1157 06/17/17 0523  WBC 12.3* 8.4  CREATININE 0.76 0.74    Estimated Creatinine Clearance: 73.3 mL/min (by C-G formula based on SCr of 0.74 mg/dL).    Allergies  Allergen Reactions  . Aspirin   . Pregabalin Other (See Comments)    Unsteady gate, causing falls  . Tramadol Rash    Antimicrobials this admission: Ceftriaxone 8/21  >>    >>   Dose adjustments this admission:   Microbiology results: No micro      8/21 UA: LE (+) NO2(-) WBC TNTC Thank you for allowing pharmacy to be a part of this patient's care.  Kelbi Renstrom S 06/17/2017 10:50 PM

## 2017-06-17 NOTE — Evaluation (Signed)
Physical Therapy Evaluation Patient Details Name: Lauren Escobar MRN: [id number] DOB: 07-18-1966 Today's Date: 06/17/2017   History of Present Illness  Pt is a 51 yo F, admitted to acute care with GI bleed on 8/20. Prior to admission pt required assist from her boyfriend for ADl's and IADL's, amb with RW. PMH: anxiety, asthma, bleeding disorder, blood dyscrasia, CKD, depression, diabetes mellitus, HA, horizontal nystagmus, HTN, and Von Willebrand disease.   Clinical Impression  Pt is pleasant and willing to participate for return to PLOF Pt performs bed mobility with MinA, tranfers with supervision, and ambulation with with MinA. Pt amb total of 15 ft, becoming increasingly unsteady and reporting n/t to B LE's. Reports her LE's and UE's frequently go numb and then lose all sensation, and this has been going on several times a day for several months. States she has a fall at least once a week due to losing all feeling in extremities. Upon assessing pt sensation, pt inconsistent with reports. Nursing notified of pt reports. Pt presents with the following deficits: strength, endurance, balance, and pain. Overall, pt responded well to today's treatment with no adverse affects. Pt would benefit from skilled PT to address the previously mentioned impairments and promote return to PLOF. Pt is close to baseline levels; given previously mentioned impairments, currently recommending HHPT with intermittent supervision, pending d/c.       Follow Up Recommendations Home health PT;Supervision - Intermittent    Equipment Recommendations  Rolling walker with 5" wheels    Recommendations for Other Services       Precautions / Restrictions Precautions Precautions: Fall Restrictions Weight Bearing Restrictions: No      Mobility  Bed Mobility Overal bed mobility: Needs Assistance Bed Mobility: Supine to Sit     Supine to sit: Min assist     General bed mobility comments: Pt minA with supine to  sit, requiring HHA and min cues for body mechancis. Increased time required to perform task.   Transfers Overall transfer level: Needs assistance Equipment used: Rolling walker (2 wheeled) Transfers: Sit to/from Stand Sit to Stand: Supervision         General transfer comment: Supervision with STS, requiring min cues for mechanics and safety. Requires increased time to perform task.   Ambulation/Gait Ambulation/Gait assistance: Min assist Ambulation Distance (Feet): 15 Feet Assistive device: Rolling walker (2 wheeled) Gait Pattern/deviations: Shuffle     General Gait Details: Pt requires minA with amb, as she became increasingly unsteady with further amb, reporting n/t to B LE's. Requires min verbal and tactile cues for mechancis and safety.   Stairs            Wheelchair Mobility    Modified Rankin (Stroke Patients Only)       Balance Overall balance assessment: Needs assistance Sitting-balance support: Bilateral upper extremity supported;Feet supported Sitting balance-Leahy Scale: Good Sitting balance - Comments: Supervision with sittign balance, due to increasd levels of pain upon sitting. Pt with slight increase in postural sway, but able to self-correct.    Standing balance support: Bilateral upper extremity supported Standing balance-Leahy Scale: Fair Standing balance comment: Fair standing balance, requiring B UE support. Pt with generally flexed posture and increased postural sway, but able to self correct with static standing balance.                              Pertinent Vitals/Pain Pain Assessment: 0-10 Pain Score: 10-Worst pain ever Pain Location: abdomen;  intermittently with movement.  Pain Descriptors / Indicators: Grimacing;Discomfort;Moaning Pain Intervention(s): Limited activity within patient's tolerance;Monitored during session;RN gave pain meds during session    [place] expects to be discharged to:: Private  residence Living Arrangements: Spouse/significant other Available Help at Discharge: Family Type of Home: Mobile home Home Access: Stairs to enter Entrance Stairs-Rails: Right;Left;Can reach both Entrance Stairs-Number of Steps: 4 Home Layout: One level        Prior Function Level of Independence: Needs assistance         Comments: Pt living with boyfriend, who is unhealthy and has CHF. Per pt, he assists her with ADL's adn IADL's. Pt amb with RW, but must have boyfriend hold onto her to prevent falls. Pt states she falls 1x per week, due to losing all feeling in her UE's and LE's. Pt last fall was ~1 week ago when walking to the mailbox.  Per pt, she loses feeling in her extremities everyday, whether she is up amb or sitting in chair ~2x per day. Pt on disability and only amb short distances in the home     Hand Dominance        Extremity/Trunk Assessment   Upper Extremity Assessment Upper Extremity Assessment: Overall WFL for tasks assessed    Lower Extremity Assessment Lower Extremity Assessment: Generalized weakness;RLE deficits/detail;LLE deficits/detail (MMT to B LE's grossly 4/5.) RLE Deficits / Details: During amb, pt reported losing sensation to her LE's. Upon assessing light touch with pt eyes closed. Pt was highly inconsistent with her reports. Occassionally getting R and L confused.  LLE Deficits / Details: During amb, pt reported losing sensation to her LE's. Upon assessing light touch with pt eyes closed. Pt was highly inconsistent with her reports. Occassionally getting R and L confused.        Communication   Communication: No difficulties  Cognition Arousal/Alertness: Awake/alert Behavior During Therapy: WFL for tasks assessed/performed Overall Cognitive Status: Difficult to assess                                 General Comments: Pt was inconsistent in her reports of falling, and with difficulty describing history behind losing sensation to  extremities. Upon assessing pt sensation, she was inconsistent with her reports.       General Comments      Exercises Other Exercises Other Exercises: SLR x 10 reps in supine with supervision. Good mechancis.  Other Exercises: toileting: minA for transfers and CGA for clothing and hygiene.    Assessment/Plan    PT Assessment Patient needs continued PT services  PT Problem List Decreased strength;Decreased activity tolerance;Decreased balance;Decreased mobility;Decreased coordination;Pain       PT Treatment Interventions DME instruction;Gait training;Stair training;Functional mobility training;Therapeutic activities;Therapeutic exercise;Balance training;Neuromuscular re-education;Patient/family education;Manual techniques    PT Goals (Current goals can be found in the Care Plan section)  Acute Rehab PT Goals Patient Stated Goal: to go home PT Goal Formulation: With patient Time For Goal Achievement: 07/01/17 Potential to Achieve Goals: Good    Frequency Min 2X/week   Barriers to discharge        Co-evaluation               AM-PAC PT "6 Clicks" Daily Activity  Outcome Measure Difficulty turning over in bed (including adjusting bedclothes, sheets and blankets)?: Unable Difficulty moving from lying on back to sitting on the side of the bed? : Unable Difficulty sitting down on and  standing up from a chair with arms (e.g., wheelchair, bedside commode, etc,.)?: Unable Help needed moving to and from a bed to chair (including a wheelchair)?: A Little Help needed walking in hospital room?: A Little Help needed climbing 3-5 steps with a railing? : Total 6 Click Score: 10    End of Session Equipment Utilized During Treatment: Gait belt Activity Tolerance: Patient tolerated treatment well Patient left: in chair;with chair alarm set;with call bell/phone within reach;with nursing/sitter in room Nurse Communication: Mobility status      Time: 5366-4403 PT Time Calculation  (min) (ACUTE ONLY): 32 min   Charges:         PT G Codes:        Oran Rein PT, SPT  Bevelyn Ngo 06/17/2017, 5:24 PM

## 2017-06-17 NOTE — Progress Notes (Signed)
[facility] NAME: Lauren Escobar    MR#:  [id number]  DATE OF BIRTH:  09-11-1966  SUBJECTIVE:   Patient has not eaten and therefore has not had diarrhea  REVIEW OF SYSTEMS:    Review of Systems  Constitutional: Negative for fever, chills weight loss HENT: Negative for ear pain, nosebleeds, congestion, facial swelling, rhinorrhea, neck pain, neck stiffness and ear discharge.   Respiratory: Negative for cough, shortness of breath, wheezing  Cardiovascular: Negative for chest pain, palpitations and leg swelling.  Gastrointestinal: She was having diarrhea. No constipation currently no abdominal pain  Genitourinary: Negative for dysuria, urgency, frequency, hematuria Musculoskeletal: Negative for back pain or joint pain Neurological: Negative for dizziness, seizures, syncope, focal weakness,  numbness and headaches.  Hematological: Does not bruise/bleed easily.  Psychiatric/Behavioral: Negative for hallucinations, confusion, dysphoric mood    Tolerating Diet:npo      DRUG ALLERGIES:   Allergies  Allergen Reactions  . Aspirin   . Pregabalin Other (See Comments)    Unsteady gate, causing falls  . Tramadol Rash    VITALS:  Blood pressure 94/67, pulse 82, temperature 97.8 F (36.6 C), temperature source Oral, resp. rate (!) 21, height 4\' 11"  (1.499 m), weight 74.7 kg (164 lb 11.2 oz), SpO2 97 %.  PHYSICAL EXAMINATION:  Constitutional: Appears well-developed and well-nourished. No distress. HENT: Normocephalic. Marland Kitchen Oropharynx is clear and moist.  Eyes: Conjunctivae and EOM are normal. PERRLA, no scleral icterus.  Neck: Normal ROM. Neck supple. No JVD. No tracheal deviation. CVS: RRR, S1/S2 +, no murmurs, no gallops, no carotid bruit.  Pulmonary: Effort and breath sounds normal, no stridor, rhonchi, wheezes, rales.  Abdominal: Soft. BS +,  no distension, tenderness, rebound or guarding.  Musculoskeletal: Normal range of  motion. No edema and no tenderness.  Neuro: Alert. CN 2-12 grossly intact. No focal deficits. Skin: Skin is warm and dry. No rash noted. Psychiatric: Normal mood and affect.      LABORATORY PANEL:   CBC  Recent Labs Lab 06/17/17 0523  WBC 8.4  HGB 10.4*  HCT 33.6*  PLT 249   ------------------------------------------------------------------------------------------------------------------  Chemistries   Recent Labs Lab 06/16/17 1157 06/17/17 0523  NA 141 141  K 3.6 2.8*  CL 108 111  CO2 25 26  GLUCOSE 120* 106*  BUN 8 6  CREATININE 0.76 0.74  CALCIUM 9.1 7.7*  AST 43*  --   ALT 21  --   ALKPHOS 81  --   BILITOT 0.5  --    ------------------------------------------------------------------------------------------------------------------  Cardiac Enzymes No results for input(s): TROPONINI in the last 168 hours. ------------------------------------------------------------------------------------------------------------------  RADIOLOGY:  Ct Abdomen Pelvis W Contrast  Result Date: 06/16/2017 CLINICAL DATA:  Abdominal and rectal pain with GI bleeding. Bleeding disorder (polycythemia vera). Previous hysterectomy and appendectomy. EXAM: CT ABDOMEN AND PELVIS WITH CONTRAST TECHNIQUE: Multidetector CT imaging of the abdomen and pelvis was performed using the standard protocol following bolus administration of intravenous contrast. CONTRAST:  166mL ISOVUE-300 IOPAMIDOL (ISOVUE-300) INJECTION 61% COMPARISON:  CT 11/27/2016. FINDINGS: Lower chest: There is stable minimal nodularity at the left lung base. No significant pleural or pericardial effusion. Hepatobiliary: Mild hepatic steatosis appears improved. No focal hepatic abnormality or abnormal enhancement identified. No evidence of gallstones, gallbladder wall thickening or biliary dilatation. Pancreas: Unremarkable. No pancreatic ductal dilatation or surrounding inflammatory changes. Spleen: Normal in size without focal  abnormality. Adrenals/Urinary Tract: Both adrenal glands appear normal. The kidneys and ureters appear normal. No evidence of urinary  tract calculus or hydronephrosis. Possible mild bladder wall thickening without residual air in the bladder lumen or focal urothelial lesion. Stomach/Bowel: No evidence of bowel wall thickening, distention or surrounding inflammatory change. There is stable fat deposition within the colonic wall. Postsurgical changes from previous appendectomy are noted. Vascular/Lymphatic: There are no enlarged abdominal or pelvic lymph nodes. No significant vascular findings are present. Reproductive: Hysterectomy.  No evidence of adnexal mass. Other: No evidence of abdominal wall mass or hernia. No ascites. Musculoskeletal: No acute or significant osseous findings. Pelvic stimulator again noted with single lead traversing the right S3 foramen. IMPRESSION: 1. No acute abdominopelvic findings or explanation for GI bleeding identified. 2. Chronic colonic wall fat deposition may be secondary to body habitus or chronic inflammation. No surrounding inflammatory changes. 3. Possible mild bladder wall thickening. 4. Improved hepatic steatosis. 5. Pelvic stimulator noted. Electronically Signed   By: Richardean Sale M.D.   On: 06/16/2017 16:11     ASSESSMENT AND PLAN:   51 year old female with history of polycythemia vera, essential hypertension and diabetes who presents with diarrhea and melena.  1. Melena with GI bleed: Hemoglobin has remained stable Continue PPI GI consultation Continue to follow hemoglobin History of von Willebrand's disease and received 1 dose of Desyrel depression in the emergency room  2. Diarrhea: No diarrhea since admission.  3. Mild intermittent asthma: This is stable  4. Diabetes: Continue sliding scale  5. Hypokalemia from diarrhea: Replete and check mg level  6. Depression: Continue Wellbutrin  Management plans discussed with the patient and she is in  agreement.  CODE STATUS: full  TOTAL TIME TAKING CARE OF THIS PATIENT: 30 minutes.     POSSIBLE D/C tomorrow, DEPENDING ON CLINICAL CONDITION.   Caroleen Stoermer M.D on 06/17/2017 at 9:38 AM  Between 7am to 6pm - Pager - [phone] After 6pm go to [email or web address] - password [email or web address] [facility]  Office  [phone]  CC: Primary care physician; Letta Median, MD  Note: This dictation was prepared with Dragon dictation along with smaller phrase technology. Any transcriptional errors that result from this process are unintentional.

## 2017-06-17 NOTE — Progress Notes (Signed)
Primary nurse notified Dr. Almyra Free of pt U/A. Orders received for IV rocephrin. Primary nurse to continue to monitor.

## 2017-06-17 NOTE — Progress Notes (Signed)
MEDICATION RELATED CONSULT NOTE - INITIAL   Pharmacy Consult for Electrolytes  Allergies  Allergen Reactions  . Aspirin   . Pregabalin Other (See Comments)    Unsteady gate, causing falls  . Tramadol Rash    Patient Measurements: Height: 4\' 11"  (149.9 cm) Weight: 164 lb 11.2 oz (74.7 kg) IBW/kg (Calculated) : 43.2  Vital Signs: Temp: 98 F (36.7 C) (08/21 1319) Temp Source: Oral (08/21 1319) BP: 99/59 (08/21 1319) Pulse Rate: 79 (08/21 1319) Intake/Output from previous day: 08/20 0701 - 08/21 0700 In: 1238.8 [I.V.:1238.8] Out: 200 [Urine:200] Intake/Output from this shift: Total I/O In: 541 [I.V.:541] Out: 200 [Urine:200]  Labs:  Recent Labs  06/16/17 1157 06/16/17 2040 06/17/17 0523  WBC 12.3*  --  8.4  HGB 11.7* 10.2* 10.4*  HCT 38.2  --  33.6*  PLT 348  --  249  CREATININE 0.76  --  0.74  MG  --   --  1.7  ALBUMIN 3.8  --   --   PROT 7.1  --   --   AST 43*  --   --   ALT 21  --   --   ALKPHOS 81  --   --   BILITOT 0.5  --   --    Estimated Creatinine Clearance: 73.3 mL/min (by C-G formula based on SCr of 0.74 mg/dL).   Microbiology: No results found for this or any previous visit (from the past 720 hour(s)).  Medical History: Past Medical History:  Diagnosis Date  . Anxiety   . Asthma   . Bleeding disorder ([place])   . Blood dyscrasia    polycythemia vera  . Chronic kidney disease   . Depression   . Diabetes mellitus without complication ([place])   . Headache 2000  . Horizontal nystagmus    age 68  . Hypertension   . Von Willebrand's disease [facility])     Assessment: 51 year old patient presents to hospital complaining of severe watery diarrhea with bright red blood in stool. Pharmacy consulted to follow electrolytes. Today, potassium subtherapeutic at 2.8 mmol/L. Magnesium within range at 1.7 mg/dl.   Goal of Therapy:  Electrolytes WNL K+ Goal 3.5-5.1 mmol/L   Plan:  For hypokalemia, patient today received KCl 10 mEq IV x 4.  Will check  K+ level today @ 1600.  Will followed up with tomorrow BMP and adjust electrolytes accordingly.    [place] Resident  06/17/2017,2:40 PM

## 2017-06-17 NOTE — Progress Notes (Signed)
MEDICATION RELATED CONSULT NOTE - INITIAL   Pharmacy Consult for Electrolytes  Allergies  Allergen Reactions  . Aspirin   . Pregabalin Other (See Comments)    Unsteady gate, causing falls  . Tramadol Rash    Patient Measurements: Height: 4\' 11"  (149.9 cm) Weight: 164 lb 11.2 oz (74.7 kg) IBW/kg (Calculated) : 43.2  Vital Signs: Temp: 98 F (36.7 C) (08/21 1319) Temp Source: Oral (08/21 1319) BP: 99/59 (08/21 1319) Pulse Rate: 79 (08/21 1319) Intake/Output from previous day: 08/20 0701 - 08/21 0700 In: 1238.8 [I.V.:1238.8] Out: 200 [Urine:200] Intake/Output from this shift: Total I/O In: 1560 [I.V.:1160; IV Piggyback:400] Out: 375 [Urine:375]  Labs:  Recent Labs  06/16/17 1157 06/16/17 2040 06/17/17 0523  WBC 12.3*  --  8.4  HGB 11.7* 10.2* 10.4*  HCT 38.2  --  33.6*  PLT 348  --  249  CREATININE 0.76  --  0.74  MG  --   --  1.7  ALBUMIN 3.8  --   --   PROT 7.1  --   --   AST 43*  --   --   ALT 21  --   --   ALKPHOS 81  --   --   BILITOT 0.5  --   --    Estimated Creatinine Clearance: 73.3 mL/min (by C-G formula based on SCr of 0.74 mg/dL).   Microbiology: No results found for this or any previous visit (from the past 720 hour(s)).  Medical History: Past Medical History:  Diagnosis Date  . Anxiety   . Asthma   . Bleeding disorder ([place])   . Blood dyscrasia    polycythemia vera  . Chronic kidney disease   . Depression   . Diabetes mellitus without complication ([place])   . Headache 2000  . Horizontal nystagmus    age 61  . Hypertension   . Von Willebrand's disease [facility])     Assessment: 51 year old patient presents to hospital complaining of severe watery diarrhea with bright red blood in stool. Pharmacy consulted to follow electrolytes.   Goal of Therapy: Electrolytes WNL  Plan:  K = 3.6 after replacement.  No additional supplementation needed at this time.  Will recheck electrolytes with AM labs tomorrow.    Lenis Noon,  PharmD Clinical Pharmacist 06/17/2017,4:44 PM

## 2017-06-18 ENCOUNTER — Inpatient Hospital Stay: Payer: Medicare Other | Admitting: Anesthesiology

## 2017-06-18 ENCOUNTER — Encounter: Payer: Self-pay | Admitting: *Deleted

## 2017-06-18 ENCOUNTER — Encounter
Admission: EM | Disposition: A | Discharge status: Home Health | Payer: Self-pay | Source: Home / Self Care | Attending: Internal Medicine

## 2017-06-18 HISTORY — PX: ESOPHAGOGASTRODUODENOSCOPY: SHX5428

## 2017-06-18 LAB — BASIC METABOLIC PANEL
ANION GAP: 4 — AB (ref 5–15)
BUN: 6 mg/dL (ref 6–20)
CHLORIDE: 114 mmol/L — AB (ref 101–111)
CO2: 25 mmol/L (ref 22–32)
Calcium: 7.7 mg/dL — ABNORMAL LOW (ref 8.9–10.3)
Creatinine, Ser: 0.62 mg/dL (ref 0.44–1.00)
GFR calc non Af Amer: >60 mL/min (ref >60)
Glucose, Bld: 105 mg/dL — ABNORMAL HIGH (ref 65–99)
Potassium: 3.4 mmol/L — ABNORMAL LOW (ref 3.5–5.1)
Sodium: 143 mmol/L (ref 135–145)

## 2017-06-18 LAB — CBC
HEMATOCRIT: 33.5 % — AB (ref 35.0–47.0)
HEMOGLOBIN: 10.2 g/dL — AB (ref 12.0–16.0)
MCH: 20.3 pg — ABNORMAL LOW (ref 26.0–34.0)
MCHC: 30.3 g/dL — ABNORMAL LOW (ref 32.0–36.0)
MCV: 66.8 fL — ABNORMAL LOW (ref 80.0–100.0)
Platelets: 233 10*3/uL (ref 150–440)
RBC: 5.02 MIL/uL (ref 3.80–5.20)
RDW: 19.6 % — ABNORMAL HIGH (ref 11.5–14.5)
WBC: 8.3 10*3/uL (ref 3.6–11.0)

## 2017-06-18 LAB — GLUCOSE, CAPILLARY
GLUCOSE-CAPILLARY: 84 mg/dL (ref 65–99)
GLUCOSE-CAPILLARY: 92 mg/dL (ref 65–99)
Glucose-Capillary: 83 mg/dL (ref 65–99)

## 2017-06-18 LAB — HIV ANTIBODY (ROUTINE TESTING W REFLEX): HIV Screen 4th Generation wRfx: NONREACTIVE

## 2017-06-18 LAB — MAGNESIUM: Magnesium: 1.8 mg/dL (ref 1.7–2.4)

## 2017-06-18 SURGERY — EGD (ESOPHAGOGASTRODUODENOSCOPY)
Anesthesia: General

## 2017-06-18 MED ORDER — CEFUROXIME AXETIL 500 MG PO TABS: 500.0000 mg | ORAL_TABLET | Freq: Two times a day (BID) | ORAL | 0 refills | Status: DC

## 2017-06-18 MED ORDER — GLYCOPYRROLATE 0.2 MG/ML IJ SOLN
INTRAMUSCULAR | Status: AC
  Filled 2017-06-18: qty 1

## 2017-06-18 MED ORDER — POTASSIUM CHLORIDE 10 MEQ/100ML IV SOLN: 10.0000 meq | INTRAVENOUS | Status: AC

## 2017-06-18 MED ORDER — PROPOFOL 500 MG/50ML IV EMUL
INTRAVENOUS | Status: AC
  Filled 2017-06-18: qty 50

## 2017-06-18 MED ORDER — LIDOCAINE HCL (CARDIAC) 20 MG/ML IV SOLN
INTRAVENOUS | Status: DC | PRN
  Administered 2017-06-18: 100 mg via INTRAVENOUS

## 2017-06-18 MED ORDER — LIDOCAINE HCL (PF) 2 % IJ SOLN
INTRAMUSCULAR | Status: AC
  Filled 2017-06-18: qty 2

## 2017-06-18 MED ORDER — PROPOFOL 10 MG/ML IV BOLUS
INTRAVENOUS | Status: DC | PRN
  Administered 2017-06-18: 80 mg via INTRAVENOUS

## 2017-06-18 MED ORDER — POTASSIUM CHLORIDE CRYS ER 20 MEQ PO TBCR: 40.0000 meq | EXTENDED_RELEASE_TABLET | Freq: Once | ORAL | Status: DC

## 2017-06-18 MED ORDER — GABAPENTIN 300 MG PO CAPS: 900.0000 mg | ORAL_CAPSULE | Freq: Three times a day (TID) | ORAL | 0 refills | Status: DC

## 2017-06-18 MED ORDER — GLYCOPYRROLATE 0.2 MG/ML IJ SOLN
INTRAMUSCULAR | Status: DC | PRN
  Administered 2017-06-18: 0.2 mg via INTRAVENOUS

## 2017-06-18 MED ORDER — PROPOFOL 500 MG/50ML IV EMUL
INTRAVENOUS | Status: DC | PRN
  Administered 2017-06-18: 150 ug/kg/min via INTRAVENOUS

## 2017-06-18 NOTE — Progress Notes (Signed)
Pt d/c back to group home today.  IV removed intact.  Rx's given to pt w/all questions and concerns addressed.  D/C paperwork reviewed and education provided with all questions and concerns addressed.  Pt boyfriend at bedside for home transport.

## 2017-06-18 NOTE — Care Management (Signed)
Anticipate discharge today and recommendation of home health physical therapy.  Patient is agreement.  Does not feel she needs nursing.  Has a walker at home so canceled order for DME.  No agency preference.  Referral to [facility]. If there are findings of concern on EGD may need to revisit the home health nurse order.

## 2017-06-18 NOTE — Anesthesia Preprocedure Evaluation (Addendum)
Anesthesia Evaluation  Patient identified by MRN, date of birth, ID band Patient awake    Reviewed: Allergy & Precautions, H&P , NPO status , Patient's Chart, lab work & pertinent test results, reviewed documented beta blocker date and time   History of Anesthesia Complications Negative for: history of anesthetic complications  Airway Mallampati: III  TM Distance: >3 FB Neck ROM: full    Dental  (+) Edentulous Upper, Edentulous Lower   Pulmonary shortness of breath and with exertion, asthma , neg sleep apnea, COPD,  COPD inhaler, neg recent URI, former smoker,           Cardiovascular Exercise Tolerance: Good hypertension, + angina (stable, none recently) + CAD  (-) Past MI, (-) Cardiac Stents and (-) CABG (-) dysrhythmias (-) Valvular Problems/Murmurs     Neuro/Psych neg Seizures PSYCHIATRIC DISORDERS (Depression)  Neuromuscular disease    GI/Hepatic Neg liver ROS, GERD  ,  Endo/Other  diabetes  Renal/GU CRFRenal disease  negative genitourinary   Musculoskeletal   Abdominal   Peds  Hematology negative hematology ROS (+) Blood dyscrasia (von willebrand's and polycythemia vera), ,   Anesthesia Other Findings Past Medical History: No date: Anxiety No date: Asthma No date: Bleeding disorder (HCC) No date: Blood dyscrasia     Comment:  polycythemia vera No date: Chronic kidney disease No date: Depression No date: Diabetes mellitus without complication (HCC) 2000: Headache No date: Horizontal nystagmus     Comment:  age 51 No date: Hypertension No date: Von Willebrand's disease (HCC)   Reproductive/Obstetrics negative OB ROS                             Anesthesia Physical  Anesthesia Plan  ASA: III  Anesthesia Plan: General   Post-op Pain Management:    Induction: Intravenous  PONV Risk Score and Plan: 3 and Propofol infusion  Airway Management Planned: Natural Airway and  Nasal Cannula  Additional Equipment:   Intra-op Plan:   Post-operative Plan:   Informed Consent: I have reviewed the patients History and Physical, chart, labs and discussed the procedure including the risks, benefits and alternatives for the proposed anesthesia with the patient or authorized representative who has indicated his/her understanding and acceptance.     Dental Advisory Given  Plan Discussed with: CRNA  Anesthesia Plan Comments:         Anesthesia Quick Evaluation  

## 2017-06-18 NOTE — Anesthesia Post-op Follow-up Note (Signed)
Anesthesia QCDR form completed.        

## 2017-06-18 NOTE — Progress Notes (Signed)
Pharmacy Antibiotic Note  Lauren Escobar is a 51 y.o. female admitted on 06/16/2017 with UTI.  Pharmacy has been consulted for ceftriaxone dosing.  Plan: Continue ceftriaxone 2 grams q24h.  Height: 4\' 11"  (149.9 cm) Weight: 165 lb (74.8 kg) IBW/kg (Calculated) : 43.2  Temp (24hrs), Avg:97.9 F (36.6 C), Min:97.8 F (36.6 C), Max:98 F (36.7 C)   Recent Labs Lab 06/16/17 1157 06/17/17 0523 06/18/17 0445  WBC 12.3* 8.4 8.3  CREATININE 0.76 0.74 0.62    Estimated Creatinine Clearance: 73.3 mL/min (by C-G formula based on SCr of 0.62 mg/dL).    Allergies  Allergen Reactions  . Aspirin   . Pregabalin Other (See Comments)    Unsteady gate, causing falls  . Tramadol Rash    Antimicrobials this admission: Ceftriaxone 8/21  >>    Microbiology results: CDiff: Sent  Stool: Sent   8/21 UA: LE (+) NO2(-) WBC TNTC  Thank you for allowing pharmacy to be a part of this patient's care.  [place] Resident  06/18/2017 11:56 AM

## 2017-06-18 NOTE — Discharge Summary (Signed)
[place] [facility] [place] NAME: Lauren Escobar    MR#:  [id number]  DATE OF BIRTH:  [birth year]  DATE OF ADMISSION:  06/16/2017 ADMITTING PHYSICIAN: Hillary Bow, MD  DATE OF DISCHARGE: 06/18/2017  PRIMARY CARE PHYSICIAN: Letta Median, MD    ADMISSION DIAGNOSIS:  blood in urine Melena  DISCHARGE DIAGNOSIS:  Active Problems:   GI bleed   Melena   SECONDARY DIAGNOSIS:   Past Medical History:  Diagnosis Date  . Anxiety   . Asthma   . Bleeding disorder ([place])   . Blood dyscrasia    polycythemia vera  . Chronic kidney disease   . Depression   . Diabetes mellitus without complication ([place])   . Headache 2000  . Horizontal nystagmus    age 48  . Hypertension   . Von Willebrand's disease [facility])     HOSPITAL COURSE:   51 year old female with history of polycythemia vera, essential hypertension and diabetes who presents with diarrhea and melena.  1. Melena has ruled out for GI bleed: Her hemoglobin has remained stable. She was evaluated GI. She underwent endoscopy which was normal.   History of von Willebrand's disease and received 1 dose of Desmopression in the emergency room  2. Diarrhea: No diarrhea since admission.  3. Mild intermittent asthma: This is stable  4. Diabetes: She may resume metformin  5. Hypokalemia from diarrhea: Potassium was repleted   DISCHARGE CONDITIONS AND DIET:   Stable Cardiac diet/diabetic diet  CONSULTS OBTAINED:  Treatment Team:  Lin Landsman, MD  DRUG ALLERGIES:   Allergies  Allergen Reactions  . Aspirin   . Pregabalin Other (See Comments)    Unsteady gate, causing falls  . Tramadol Rash    DISCHARGE MEDICATIONS:   Current Discharge Medication List    START taking these medications   Details  cefUROXime (CEFTIN) 500 MG tablet Take 1 tablet (500 mg total) by mouth 2 (two) times daily with a meal. Qty: 12 tablet, Refills: 0      CONTINUE these medications  which have CHANGED   Details  gabapentin (NEURONTIN) 300 MG capsule Take 3 capsules (900 mg total) by mouth 3 (three) times daily. Qty: 120 capsule, Refills: 0      CONTINUE these medications which have NOT CHANGED   Details  busPIRone (BUSPAR) 15 MG tablet Take by mouth 2 (two) times daily.     diazepam (VALIUM) 10 MG tablet Take 10 mg by mouth every 12 (twelve) hours as needed.     dicyclomine (BENTYL) 10 MG capsule Take 10 mg by mouth daily.    esomeprazole (NEXIUM) 40 MG capsule Take 40 mg by mouth daily.     estradiol (ESTRACE VAGINAL) 0.1 MG/GM vaginal cream Use small pea-sized application topically to the urethral meatus each night at bedtime.    Fluticasone-Salmeterol (ADVAIR DISKUS) 250-50 MCG/DOSE AEPB Inhale 1 puff into the lungs 2 (two) times daily.     ipratropium-albuterol (DUONEB) 0.5-2.5 (3) MG/3ML SOLN 3 mL Q4H (RT).     lisdexamfetamine (VYVANSE) 70 MG capsule Take 70 mg by mouth daily.     lurasidone (LATUDA) 40 MG TABS tablet Take by mouth daily with breakfast.     mirabegron ER (MYRBETRIQ) 50 MG TB24 tablet Take 50 mg by mouth daily.     nitrofurantoin (MACRODANTIN) 100 MG capsule Take 100 mg by mouth daily.     NITROGLYCERIN SL Place 1 tablet under the tongue.    nystatin cream (MYCOSTATIN) Apply topically.  propranolol (INDERAL) 40 MG tablet Take 40 mg by mouth 2 (two) times daily.     tamsulosin (FLOMAX) 0.4 MG CAPS capsule Take 0.4 mg by mouth daily after supper.     tiotropium (SPIRIVA HANDIHALER) 18 MCG inhalation capsule Frequency:BID   Dosage:18   MCG  Instructions:  Note:Dose: 18MCG    topiramate (TOPAMAX) 50 MG tablet Take 50 mg by mouth 2 (two) times daily.     Vilazodone HCl (VIIBRYD) 40 MG TABS Take 40 mg by mouth.    diphenoxylate-atropine (LOMOTIL) 2.5-0.025 MG per tablet Take 2 tablets by mouth 4 (four) times daily as needed.     metFORMIN (GLUCOPHAGE) 500 MG tablet Take 500 mg by mouth daily with breakfast.       STOP taking  these medications     flurbiprofen (ANSAID) 100 MG tablet      isosorbide mononitrate (IMDUR) 30 MG 24 hr tablet      SUMAtriptan (IMITREX) 100 MG tablet      zaleplon (SONATA) 10 MG capsule      amphetamine-dextroamphetamine (ADDERALL) 10 MG tablet      ARIPIPRAZOLE PO      buPROPion (BUDEPRION XL) 300 MG 24 hr tablet      sucralfate (CARAFATE) 1 G tablet           Today   CHIEF COMPLAINT:   Doing well this morning. Diarrhea has resolved   VITAL SIGNS:  Blood pressure (!) 92/53, pulse 79, temperature 98 F (36.7 C), temperature source Oral, resp. rate 18, height 4\' 11"  (1.499 m), weight 74.8 kg (165 lb), SpO2 95 %.   REVIEW OF SYSTEMS:  Review of Systems  Constitutional: Negative.  Negative for chills, fever and malaise/fatigue.  HENT: Negative.  Negative for ear discharge, ear pain, hearing loss, nosebleeds and sore throat.   Eyes: Negative.  Negative for blurred vision and pain.  Respiratory: Negative.  Negative for cough, hemoptysis, shortness of breath and wheezing.   Cardiovascular: Negative.  Negative for chest pain, palpitations and leg swelling.  Gastrointestinal: Negative.  Negative for abdominal pain, blood in stool, diarrhea, nausea and vomiting.  Genitourinary: Negative.  Negative for dysuria.  Musculoskeletal: Negative.  Negative for back pain.  Skin: Negative.   Neurological: Negative for dizziness, tremors, speech change, focal weakness, seizures and headaches.  Endo/Heme/Allergies: Negative.  Does not bruise/bleed easily.  Psychiatric/Behavioral: Negative.  Negative for depression, hallucinations and suicidal ideas.     PHYSICAL EXAMINATION:  GENERAL:  51 y.o.-year-old patient lying in the bed with no acute distress.  NECK:  Supple, no jugular venous distention. No thyroid enlargement, no tenderness.  LUNGS: Normal breath sounds bilaterally, no wheezing, rales,rhonchi  No use of accessory muscles of respiration.  CARDIOVASCULAR: S1, S2 normal.  No murmurs, rubs, or gallops.  ABDOMEN: Soft, non-tender, non-distended. Bowel sounds present. No organomegaly or mass.  EXTREMITIES: No pedal edema, cyanosis, or clubbing.  PSYCHIATRIC: The patient is alert and oriented x 3.  SKIN: No obvious rash, lesion, or ulcer.   DATA REVIEW:   CBC  Recent Labs Lab 06/18/17 0445  WBC 8.3  HGB 10.2*  HCT 33.5*  PLT 233    Chemistries   Recent Labs Lab 06/16/17 1157  06/18/17 0445  NA 141  < > 143  K 3.6  < > 3.4*  CL 108  < > 114*  CO2 25  < > 25  GLUCOSE 120*  < > 105*  BUN 8  < > 6  CREATININE 0.76  < >  0.62  CALCIUM 9.1  < > 7.7*  MG  --   < > 1.8  AST 43*  --   --   ALT 21  --   --   ALKPHOS 81  --   --   BILITOT 0.5  --   --   < > = values in this interval not displayed.  Cardiac Enzymes No results for input(s): TROPONINI in the last 168 hours.  Microbiology Results  @[email or web address] @  RADIOLOGY:  Ct Abdomen Pelvis W Contrast  Result Date: 06/16/2017 CLINICAL DATA:  Abdominal and rectal pain with GI bleeding. Bleeding disorder (polycythemia vera). Previous hysterectomy and appendectomy. EXAM: CT ABDOMEN AND PELVIS WITH CONTRAST TECHNIQUE: Multidetector CT imaging of the abdomen and pelvis was performed using the standard protocol following bolus administration of intravenous contrast. CONTRAST:  171mL ISOVUE-300 IOPAMIDOL (ISOVUE-300) INJECTION 61% COMPARISON:  CT 11/27/2016. FINDINGS: Lower chest: There is stable minimal nodularity at the left lung base. No significant pleural or pericardial effusion. Hepatobiliary: Mild hepatic steatosis appears improved. No focal hepatic abnormality or abnormal enhancement identified. No evidence of gallstones, gallbladder wall thickening or biliary dilatation. Pancreas: Unremarkable. No pancreatic ductal dilatation or surrounding inflammatory changes. Spleen: Normal in size without focal abnormality. Adrenals/Urinary Tract: Both adrenal glands appear normal. The kidneys and ureters appear  normal. No evidence of urinary tract calculus or hydronephrosis. Possible mild bladder wall thickening without residual air in the bladder lumen or focal urothelial lesion. Stomach/Bowel: No evidence of bowel wall thickening, distention or surrounding inflammatory change. There is stable fat deposition within the colonic wall. Postsurgical changes from previous appendectomy are noted. Vascular/Lymphatic: There are no enlarged abdominal or pelvic lymph nodes. No significant vascular findings are present. Reproductive: Hysterectomy.  No evidence of adnexal mass. Other: No evidence of abdominal wall mass or hernia. No ascites. Musculoskeletal: No acute or significant osseous findings. Pelvic stimulator again noted with single lead traversing the right S3 foramen. IMPRESSION: 1. No acute abdominopelvic findings or explanation for GI bleeding identified. 2. Chronic colonic wall fat deposition may be secondary to body habitus or chronic inflammation. No surrounding inflammatory changes. 3. Possible mild bladder wall thickening. 4. Improved hepatic steatosis. 5. Pelvic stimulator noted. Electronically Signed   By: Richardean Sale M.D.   On: 06/16/2017 16:11      Current Discharge Medication List    START taking these medications   Details  cefUROXime (CEFTIN) 500 MG tablet Take 1 tablet (500 mg total) by mouth 2 (two) times daily with a meal. Qty: 12 tablet, Refills: 0      CONTINUE these medications which have CHANGED   Details  gabapentin (NEURONTIN) 300 MG capsule Take 3 capsules (900 mg total) by mouth 3 (three) times daily. Qty: 120 capsule, Refills: 0      CONTINUE these medications which have NOT CHANGED   Details  busPIRone (BUSPAR) 15 MG tablet Take by mouth 2 (two) times daily.     diazepam (VALIUM) 10 MG tablet Take 10 mg by mouth every 12 (twelve) hours as needed.     dicyclomine (BENTYL) 10 MG capsule Take 10 mg by mouth daily.    esomeprazole (NEXIUM) 40 MG capsule Take 40 mg by  mouth daily.     estradiol (ESTRACE VAGINAL) 0.1 MG/GM vaginal cream Use small pea-sized application topically to the urethral meatus each night at bedtime.    Fluticasone-Salmeterol (ADVAIR DISKUS) 250-50 MCG/DOSE AEPB Inhale 1 puff into the lungs 2 (two) times daily.     ipratropium-albuterol (DUONEB) 0.5-2.5 (  3) MG/3ML SOLN 3 mL Q4H (RT).     lisdexamfetamine (VYVANSE) 70 MG capsule Take 70 mg by mouth daily.     lurasidone (LATUDA) 40 MG TABS tablet Take by mouth daily with breakfast.     mirabegron ER (MYRBETRIQ) 50 MG TB24 tablet Take 50 mg by mouth daily.     nitrofurantoin (MACRODANTIN) 100 MG capsule Take 100 mg by mouth daily.     NITROGLYCERIN SL Place 1 tablet under the tongue.    nystatin cream (MYCOSTATIN) Apply topically.    propranolol (INDERAL) 40 MG tablet Take 40 mg by mouth 2 (two) times daily.     tamsulosin (FLOMAX) 0.4 MG CAPS capsule Take 0.4 mg by mouth daily after supper.     tiotropium (SPIRIVA HANDIHALER) 18 MCG inhalation capsule Frequency:BID   Dosage:18   MCG  Instructions:  Note:Dose: 18MCG    topiramate (TOPAMAX) 50 MG tablet Take 50 mg by mouth 2 (two) times daily.     Vilazodone HCl (VIIBRYD) 40 MG TABS Take 40 mg by mouth.    diphenoxylate-atropine (LOMOTIL) 2.5-0.025 MG per tablet Take 2 tablets by mouth 4 (four) times daily as needed.     metFORMIN (GLUCOPHAGE) 500 MG tablet Take 500 mg by mouth daily with breakfast.       STOP taking these medications     flurbiprofen (ANSAID) 100 MG tablet      isosorbide mononitrate (IMDUR) 30 MG 24 hr tablet      SUMAtriptan (IMITREX) 100 MG tablet      zaleplon (SONATA) 10 MG capsule      amphetamine-dextroamphetamine (ADDERALL) 10 MG tablet      ARIPIPRAZOLE PO      buPROPion (BUDEPRION XL) 300 MG 24 hr tablet      sucralfate (CARAFATE) 1 G tablet            Management plans discussed with the patient and she is in agreement. Stable for discharge home with [facility]  Patient should  follow up with pcp  CODE STATUS:     Code Status Orders        Start     Ordered   06/16/17 1654  Full code  Continuous     06/16/17 1657    Code Status History    Date Active Date Inactive Code Status Order ID Comments User Context   07/17/2016  2:41 PM 07/17/2016  6:06 PM Full Code [id number]  Corey Skains, MD Inpatient      TOTAL TIME TAKING CARE OF THIS PATIENT: 37 minutes.    Note: This dictation was prepared with Dragon dictation along with smaller phrase technology. Any transcriptional errors that result from this process are unintentional.  Makenze Ellett M.D on 06/18/2017 at 9:08 AM  Between 7am to 6pm - Pager - [phone] After 6pm go to [email or web address] - password [email or web address] [facility]  Office  [phone]  CC: Primary care physician; Letta Median, MD

## 2017-06-18 NOTE — Op Note (Signed)
[facility] Gastroenterology Patient Name: Lauren Escobar Procedure Date: 06/18/2017 1:10 PM MRN: [id number] Account #: [id number] Date of Birth: 10-Dec-1965 Admit Type: Inpatient Age: 51 Room: [facility] ENDO ROOM 1 Gender: Female Note Status: Finalized Procedure:            Upper GI endoscopy Indications:          Suspected upper gastrointestinal bleeding Providers:            Lin Landsman MD, MD Referring MD:         Baxter Kail. Rebeca Alert MD, MD (Referring MD) Medicines:            Monitored Anesthesia Care Complications:        No immediate complications. Procedure:            Pre-Anesthesia Assessment:                       - Prior to the procedure, a History and Physical was                        performed, and patient medications and allergies were                        reviewed. The patient is competent. The risks and                        benefits of the procedure and the sedation options and                        risks were discussed with the patient. All questions                        were answered and informed consent was obtained.                        Patient identification and proposed procedure were                        verified by the physician, the nurse, the                        anesthesiologist, the anesthetist and the technician in                        the pre-procedure area in the procedure room. Mental                        Status Examination: alert and oriented. Airway                        Examination: normal oropharyngeal airway and neck                        mobility. Respiratory Examination: clear to                        auscultation. CV Examination: normal. Prophylactic                        Antibiotics: The patient does not require prophylactic  antibiotics. Prior Anticoagulants: The patient has                        taken no previous anticoagulant or antiplatelet agents.                        ASA  Grade Assessment: III - A patient with severe                        systemic disease. After reviewing the risks and                        benefits, the patient was deemed in satisfactory                        condition to undergo the procedure. The anesthesia plan                        was to use monitored anesthesia care (MAC). Immediately                        prior to administration of medications, the patient was                        re-assessed for adequacy to receive sedatives. The                        heart rate, respiratory rate, oxygen saturations, blood                        pressure, adequacy of pulmonary ventilation, and                        response to care were monitored throughout the                        procedure. The physical status of the patient was                        re-assessed after the procedure.                       After obtaining informed consent, the endoscope was                        passed under direct vision. Throughout the procedure,                        the patient's blood pressure, pulse, and oxygen                        saturations were monitored continuously. The Endoscope                        was introduced through the mouth, and advanced to the                        third part of duodenum. The upper GI endoscopy was  accomplished without difficulty. The patient tolerated                        the procedure well. Findings:      The first portion of the duodenum, second portion of the duodenum and       third portion of the duodenum were normal.      The entire examined stomach was normal.      The gastroesophageal junction and examined esophagus were normal. Impression:           - Normal first portion of the duodenum, second portion                        of the duodenum and third portion of the duodenum.                       - Normal stomach.                       - Normal gastroesophageal junction  and esophagus.                       - No specimens collected. Recommendation:       - Return patient to hospital ward for possible                        discharge same day.                       - Resume regular diet.                       - Continue present medications.                       - Return to GI clinic as previously scheduled. Procedure Code(s):    --- Professional ---                       [phone], Esophagogastroduodenoscopy, flexible, transoral;                        diagnostic, including collection of specimen(s) by                        brushing or washing, when performed (separate procedure) CPT copyright 2016 American Medical Association. All rights reserved. The codes documented in this report are preliminary and upon coder review may  be revised to meet current compliance requirements. Dr. Ulyess Mort Lin Landsman MD, MD 06/18/2017 1:43:11 PM This report has been signed electronically. Number of Addenda: 0 Note Initiated On: 06/18/2017 1:10 PM      [facility]

## 2017-06-18 NOTE — Progress Notes (Addendum)
[place] for Electrolytes  Allergies  Allergen Reactions  . Aspirin   . Pregabalin Other (See Comments)    Unsteady gate, causing falls  . Tramadol Rash    Patient Measurements: Height: 4\' 11"  (149.9 cm) Weight: 165 lb (74.8 kg) IBW/kg (Calculated) : 43.2  Vital Signs: Temp: 98 F (36.7 C) (08/22 0439) Temp Source: Oral (08/22 0439) BP: 92/53 (08/22 0439) Pulse Rate: 79 (08/22 0439) Intake/Output from previous day: 08/21 0701 - 08/22 0700 In: 2362.7 [I.V.:1962.7; IV Piggyback:400] Out: 950 [Urine:950] Intake/Output from this shift: No intake/output data recorded.  Labs:  Recent Labs  06/16/17 1157 06/16/17 2040 06/17/17 0523 06/18/17 0445  WBC 12.3*  --  8.4 8.3  HGB 11.7* 10.2* 10.4* 10.2*  HCT 38.2  --  33.6* 33.5*  PLT 348  --  249 233  CREATININE 0.76  --  0.74 0.62  MG  --   --  1.7 1.8  ALBUMIN 3.8  --   --   --   PROT 7.1  --   --   --   AST 43*  --   --   --   ALT 21  --   --   --   ALKPHOS 81  --   --   --   BILITOT 0.5  --   --   --    Estimated Creatinine Clearance: 73.3 mL/min (by C-G formula based on SCr of 0.62 mg/dL).   Microbiology: No results found for this or any previous visit (from the past 720 hour(s)).  Medical History: Past Medical History:  Diagnosis Date  . Anxiety   . Asthma   . Bleeding disorder ([place])   . Blood dyscrasia    polycythemia vera  . Chronic kidney disease   . Depression   . Diabetes mellitus without complication ([place])   . Headache 2000  . Horizontal nystagmus    age 63  . Hypertension   . Von Willebrand's disease [facility])     Assessment: 51 year old patient presents to hospital complaining of severe watery diarrhea with bright red blood in stool. Pharmacy consulted to follow electrolytes.   Goal of Therapy: Electrolytes WNL  Plan:  K = 3.4 KCl 10 mEg IV x 2 ordered.  Will recheck electrolytes with AM labs tomorrow.    Candelaria Stagers, PharmD Pharmacy  Resident  06/18/2017,7:07 AM

## 2017-06-18 NOTE — Transfer of Care (Signed)
Immediate Anesthesia Transfer of Care Note  Patient: Lauren Escobar  Procedure(s) Performed: Procedure(s): ESOPHAGOGASTRODUODENOSCOPY (EGD) (N/A)  Patient Location: Endoscopy Unit  Anesthesia Type:General  Level of Consciousness: awake and alert   Airway & Oxygen Therapy: Patient Spontanous Breathing and Patient connected to nasal cannula oxygen  Post-op Assessment: Report given to RN and Post -op Vital signs reviewed and stable  Post vital signs: Reviewed and stable  Last Vitals:  Vitals:   06/18/17 1159 06/18/17 1338  BP: 120/68 112/68  Pulse: 82 98  Resp:  16  Temp: 36.7 C (!) 36.2 C  SpO2: 99%     Last Pain:  Vitals:   06/18/17 1338  TempSrc: Tympanic  PainSc:       Patients Stated Pain Goal: 8 (57/97/28 2060)  Complications: No apparent anesthesia complications

## 2017-06-19 ENCOUNTER — Encounter: Payer: Self-pay | Admitting: Gastroenterology

## 2017-06-19 NOTE — Anesthesia Postprocedure Evaluation (Signed)
Anesthesia Post Note  Patient: Lauren Escobar  Procedure(s) Performed: Procedure(s) (LRB): ESOPHAGOGASTRODUODENOSCOPY (EGD) (N/A)  Patient location during evaluation: Endoscopy Anesthesia Type: General Level of consciousness: awake and alert Pain management: pain level controlled Vital Signs Assessment: post-procedure vital signs reviewed and stable Respiratory status: spontaneous breathing, nonlabored ventilation, respiratory function stable and patient connected to nasal cannula oxygen Cardiovascular status: blood pressure returned to baseline and stable Postop Assessment: no signs of nausea or vomiting Anesthetic complications: no     Last Vitals:  Vitals:   06/18/17 1408 06/18/17 1556  BP: 126/74 127/77  Pulse: 97 90  Resp: 16 16  Temp:  36.7 C  SpO2: 95% 99%    Last Pain:  Vitals:   06/18/17 1556  TempSrc: Oral  PainSc:                  Martha Clan

## 2017-07-01 NOTE — Progress Notes (Signed)
[facility]  Patient Status: not taken under care:  06/24/17 Communicated with patient on 06/23/17 attempting admission visit. Patient declined visit stating she had some family issues. She requested waiting "til end of the week." Instructed her that someone would call her to attempt visit week of 06/23/17. Patient verbalized understanding. Notified Dr. Rutherford Guys.    06/27/17  Attempted to reach patient on 8/29, 8/30, and 06/27/17 with available number and patient did not answer phone and has not called office.     IDENTIFIED NEEDS/PLAN: Due to unable to reach patient after several attempts- will be a non-admit this time. Please send another order if any further PT recommended. MD notified: Dr. Rutherford Guys  [phone]    Lauren Escobar 07/01/2017, 9:23 AM

## 2017-07-02 ENCOUNTER — Ambulatory Visit
Admission: RE | Admit: 2017-07-02 | Discharge: 2017-07-02 | Disposition: A | Discharge status: Home / Self Care | Payer: MEDICARE

## 2017-07-02 ENCOUNTER — Ambulatory Visit
Admission: RE | Admit: 2017-07-02 | Discharge: 2017-07-02 | Disposition: A | Discharge status: Home / Self Care | Payer: MEDICARE | Attending: Hematology & Oncology | Admitting: Hematology & Oncology

## 2017-07-02 DIAGNOSIS — D45 Polycythemia vera: Principal | ICD-10-CM

## 2017-08-04 ENCOUNTER — Ambulatory Visit: Payer: Medicare Other | Admitting: Gastroenterology

## 2017-08-04 ENCOUNTER — Encounter: Payer: Self-pay | Admitting: Gastroenterology

## 2017-08-13 ENCOUNTER — Ambulatory Visit
Admission: RE | Admit: 2017-08-13 | Discharge: 2017-08-13 | Disposition: A | Discharge status: Home / Self Care | Payer: MEDICARE | Attending: Hematology & Oncology | Admitting: Hematology & Oncology

## 2017-08-13 DIAGNOSIS — R319 Hematuria, unspecified: Secondary | ICD-10-CM

## 2017-08-13 DIAGNOSIS — D45 Polycythemia vera: Principal | ICD-10-CM

## 2017-08-13 DIAGNOSIS — J329 Chronic sinusitis, unspecified: Secondary | ICD-10-CM

## 2017-08-13 MED ORDER — AMOXICILLIN 500 MG CAPSULE
ORAL_CAPSULE | Freq: Three times a day (TID) | ORAL | 0 refills | 0 days | Status: CP
Start: 2017-08-13 — End: 2017-08-23

## 2017-08-15 ENCOUNTER — Ambulatory Visit
Admission: RE | Admit: 2017-08-15 | Discharge: 2017-08-15 | Disposition: A | Discharge status: Home / Self Care | Payer: MEDICARE

## 2017-08-15 DIAGNOSIS — D45 Polycythemia vera: Principal | ICD-10-CM

## 2017-08-28 DIAGNOSIS — M7541 Impingement syndrome of right shoulder: Secondary | ICD-10-CM | POA: Insufficient documentation

## 2017-08-29 ENCOUNTER — Ambulatory Visit: Admission: RE | Admit: 2017-08-29 | Discharge: 2017-08-29 | Payer: MEDICARE | Attending: Urology | Admitting: Urology

## 2017-08-29 DIAGNOSIS — N3281 Overactive bladder: Secondary | ICD-10-CM

## 2017-08-29 DIAGNOSIS — T85111D Breakdown (mechanical) of implanted electronic neurostimulator (electrode) of peripheral nerve, subsequent encounter: Principal | ICD-10-CM

## 2017-08-29 DIAGNOSIS — N312 Flaccid neuropathic bladder, not elsewhere classified: Secondary | ICD-10-CM

## 2017-08-29 DIAGNOSIS — N3020 Other chronic cystitis without hematuria: Secondary | ICD-10-CM

## 2017-08-29 DIAGNOSIS — R339 Retention of urine, unspecified: Secondary | ICD-10-CM

## 2017-09-16 ENCOUNTER — Ambulatory Visit
Admission: RE | Admit: 2017-09-16 | Discharge: 2017-09-16 | Disposition: A | Discharge status: Home / Self Care | Payer: MEDICARE

## 2017-09-16 DIAGNOSIS — D45 Polycythemia vera: Secondary | ICD-10-CM

## 2017-09-16 DIAGNOSIS — Z01818 Encounter for other preprocedural examination: Principal | ICD-10-CM

## 2017-09-16 DIAGNOSIS — J4520 Mild intermittent asthma, uncomplicated: Secondary | ICD-10-CM

## 2017-09-16 DIAGNOSIS — G629 Polyneuropathy, unspecified: Secondary | ICD-10-CM

## 2017-09-16 DIAGNOSIS — T85111A Breakdown (mechanical) of implanted electronic neurostimulator (electrode) of peripheral nerve, initial encounter: Secondary | ICD-10-CM

## 2017-09-16 DIAGNOSIS — D680 Von Willebrand's disease: Secondary | ICD-10-CM

## 2017-09-16 DIAGNOSIS — E118 Type 2 diabetes mellitus with unspecified complications: Secondary | ICD-10-CM

## 2017-09-21 DIAGNOSIS — T83118A Breakdown (mechanical) of other urinary devices and implants, initial encounter: Principal | ICD-10-CM

## 2017-09-22 ENCOUNTER — Ambulatory Visit
Admission: RE | Admit: 2017-09-22 | Discharge: 2017-09-22 | Disposition: A | Discharge status: Home / Self Care | Payer: MEDICARE | Attending: Urology | Admitting: Urology

## 2017-09-22 ENCOUNTER — Ambulatory Visit
Admission: RE | Admit: 2017-09-22 | Discharge: 2017-09-22 | Disposition: A | Discharge status: Home / Self Care | Payer: MEDICARE | Attending: Registered Nurse | Admitting: Registered Nurse

## 2017-09-22 DIAGNOSIS — T83118A Breakdown (mechanical) of other urinary devices and implants, initial encounter: Principal | ICD-10-CM

## 2017-09-22 MED ORDER — SENNOSIDES 8.6 MG TABLET
ORAL_TABLET | Freq: Every day | ORAL | 0 refills | 0 days | Status: CP
Start: 2017-09-22 — End: 2017-10-22

## 2017-09-22 MED ORDER — DOCUSATE SODIUM 100 MG CAPSULE
ORAL_CAPSULE | Freq: Two times a day (BID) | ORAL | 0 refills | 0.00000 days | Status: CP
Start: 2017-09-22 — End: 2017-10-22

## 2017-09-22 MED ORDER — OXYCODONE-ACETAMINOPHEN 5 MG-325 MG TABLET
ORAL_TABLET | ORAL | 0 refills | 0 days | Status: CP | PRN
Start: 2017-09-22 — End: 2017-09-25

## 2017-09-26 MED ORDER — OXYCODONE-ACETAMINOPHEN 5 MG-325 MG TABLET
ORAL_TABLET | Freq: Three times a day (TID) | ORAL | 0 refills | 0 days | Status: CP | PRN
Start: 2017-09-26 — End: 2017-10-01

## 2017-10-15 ENCOUNTER — Ambulatory Visit
Admission: RE | Admit: 2017-10-15 | Discharge: 2017-10-15 | Disposition: A | Discharge status: Home / Self Care | Payer: MEDICARE

## 2017-10-15 DIAGNOSIS — D45 Polycythemia vera: Principal | ICD-10-CM

## 2017-11-10 ENCOUNTER — Ambulatory Visit
Admit: 2017-11-10 | Discharge: 2017-11-11 | Payer: MEDICARE | Attending: Hematology & Oncology | Primary: Hematology & Oncology

## 2017-11-10 DIAGNOSIS — R350 Frequency of micturition: Secondary | ICD-10-CM

## 2017-11-10 DIAGNOSIS — G8929 Other chronic pain: Secondary | ICD-10-CM

## 2017-11-10 DIAGNOSIS — R634 Abnormal weight loss: Principal | ICD-10-CM

## 2017-11-20 ENCOUNTER — Encounter: Admit: 2017-11-20 | Discharge: 2017-11-21 | Payer: MEDICARE | Attending: Urology | Primary: Urology

## 2017-11-20 DIAGNOSIS — N3281 Overactive bladder: Secondary | ICD-10-CM

## 2017-11-20 DIAGNOSIS — R339 Retention of urine, unspecified: Secondary | ICD-10-CM

## 2017-11-20 DIAGNOSIS — N3020 Other chronic cystitis without hematuria: Secondary | ICD-10-CM

## 2017-11-20 DIAGNOSIS — N3010 Interstitial cystitis (chronic) without hematuria: Principal | ICD-10-CM

## 2017-12-11 ENCOUNTER — Encounter: Admit: 2017-12-11 | Discharge: 2017-12-11 | Payer: MEDICARE

## 2017-12-11 ENCOUNTER — Encounter
Admit: 2017-12-11 | Discharge: 2017-12-11 | Payer: MEDICARE | Attending: Hematology & Oncology | Primary: Hematology & Oncology

## 2017-12-11 DIAGNOSIS — D45 Polycythemia vera: Principal | ICD-10-CM

## 2017-12-11 DIAGNOSIS — K219 Gastro-esophageal reflux disease without esophagitis: Secondary | ICD-10-CM

## 2017-12-11 DIAGNOSIS — E875 Hyperkalemia: Secondary | ICD-10-CM

## 2017-12-11 DIAGNOSIS — R51 Headache: Secondary | ICD-10-CM

## 2017-12-11 MED ORDER — RANITIDINE 300 MG TABLET
ORAL_TABLET | Freq: Every evening | ORAL | 1 refills | 0 days | Status: CP
Start: 2017-12-11 — End: 2018-07-20

## 2017-12-11 MED ORDER — PROPRANOLOL 40 MG TABLET
ORAL_TABLET | Freq: Three times a day (TID) | ORAL | 0 refills | 0 days
Start: 2017-12-11 — End: 2018-07-20

## 2018-01-14 ENCOUNTER — Encounter: Admit: 2018-01-14 | Discharge: 2018-01-14 | Payer: MEDICARE

## 2018-01-14 ENCOUNTER — Encounter
Admit: 2018-01-14 | Discharge: 2018-01-14 | Payer: MEDICARE | Attending: Hematology & Oncology | Primary: Hematology & Oncology

## 2018-01-14 DIAGNOSIS — D45 Polycythemia vera: Principal | ICD-10-CM

## 2018-03-06 IMAGING — MG MM DIGITAL SCREENING BILATERAL
1 series · 4 of 4 positions shown · non-contrast
Comparison: Previous exam(s).

CLINICAL DATA: Screening.

EXAM:
DIGITAL SCREENING BILATERAL MAMMOGRAM WITH CAD

[R CC · right · 4 of 4 slices shown]
[im 1/4]
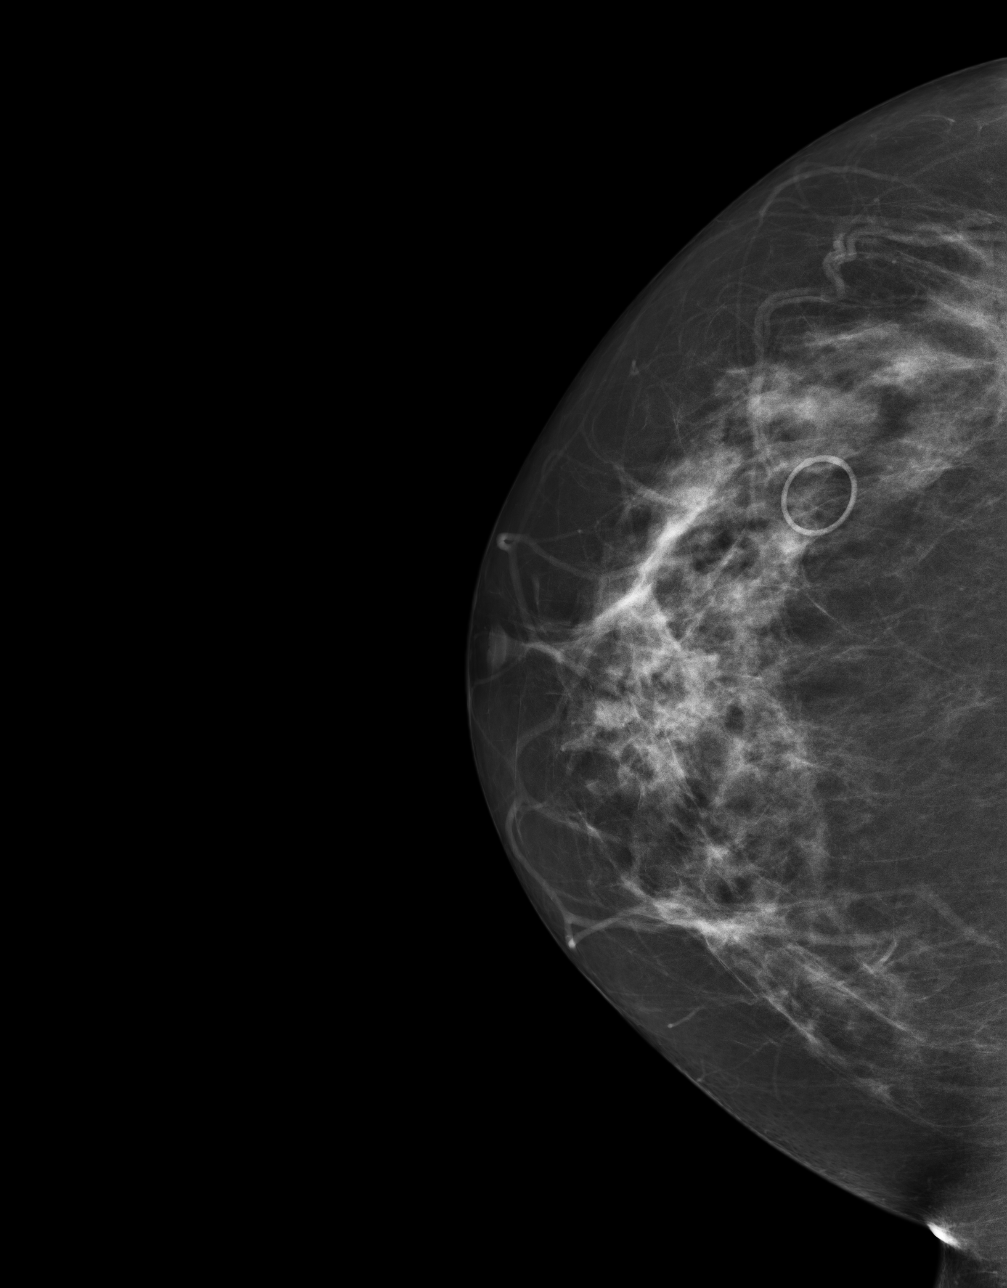
[im 2/4]
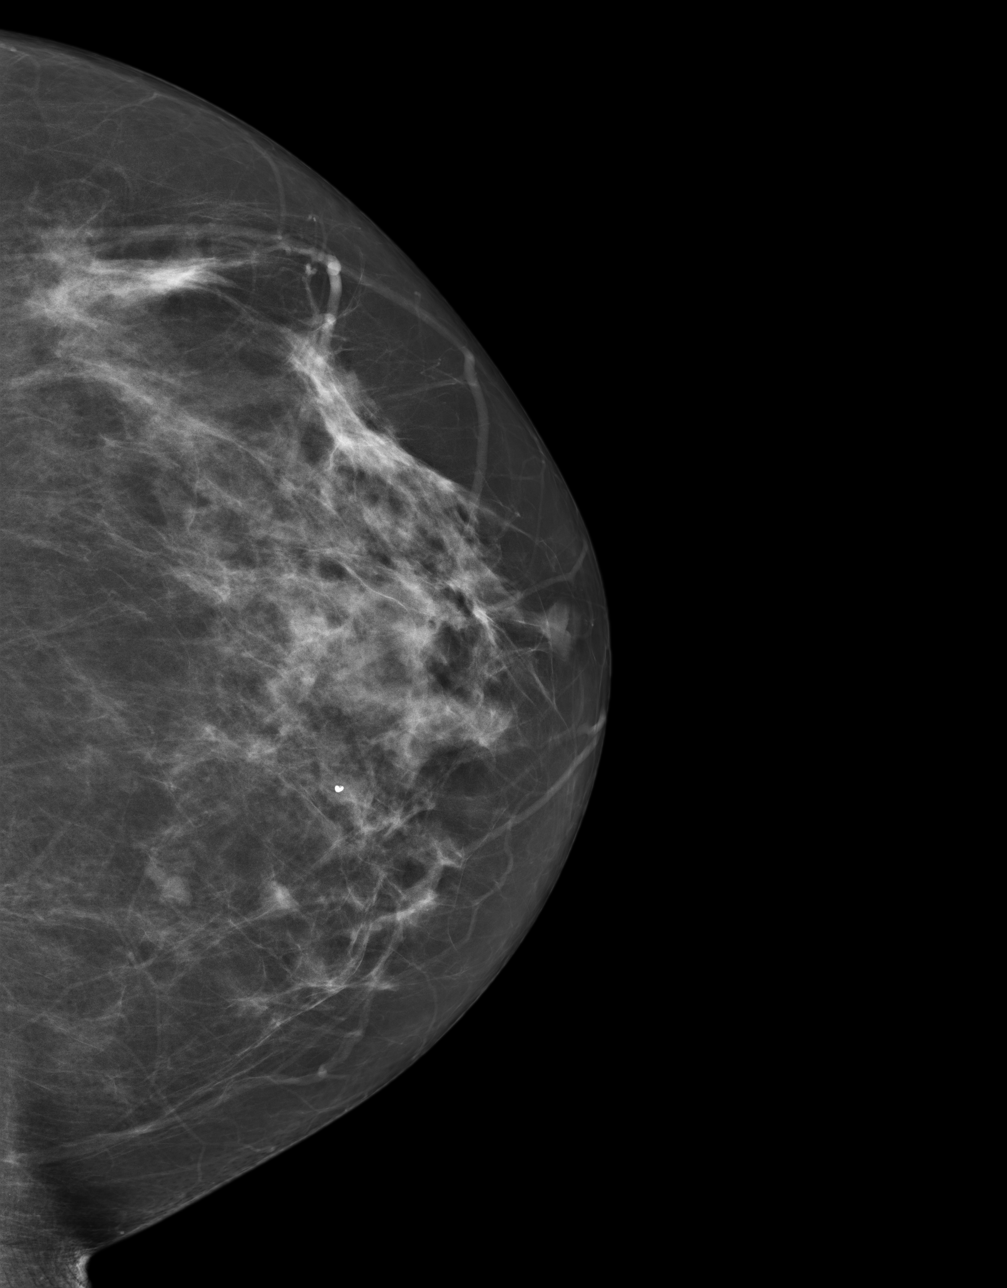
[im 3/4]
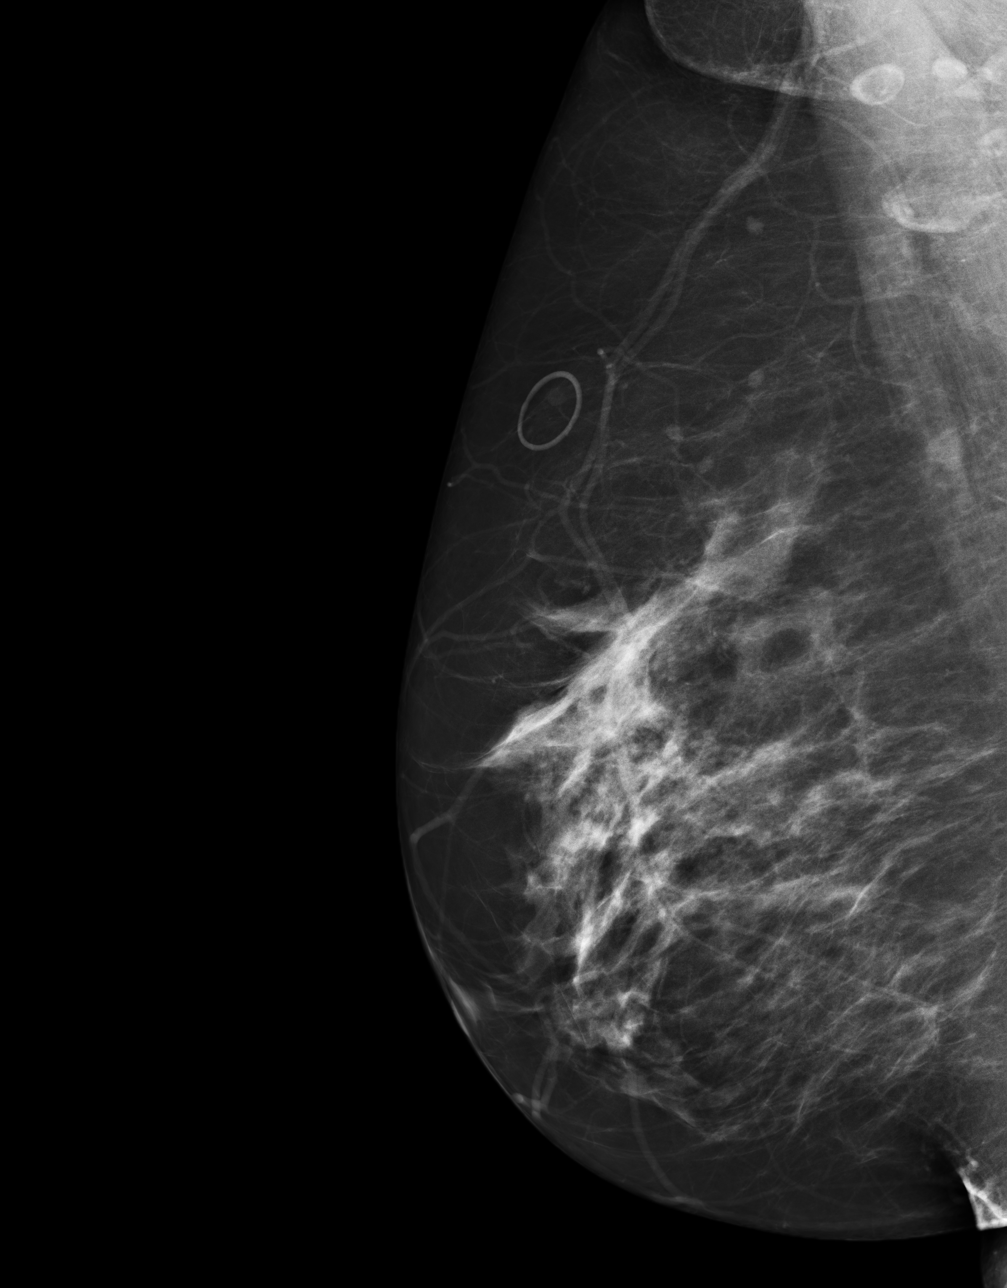
[im 4/4]
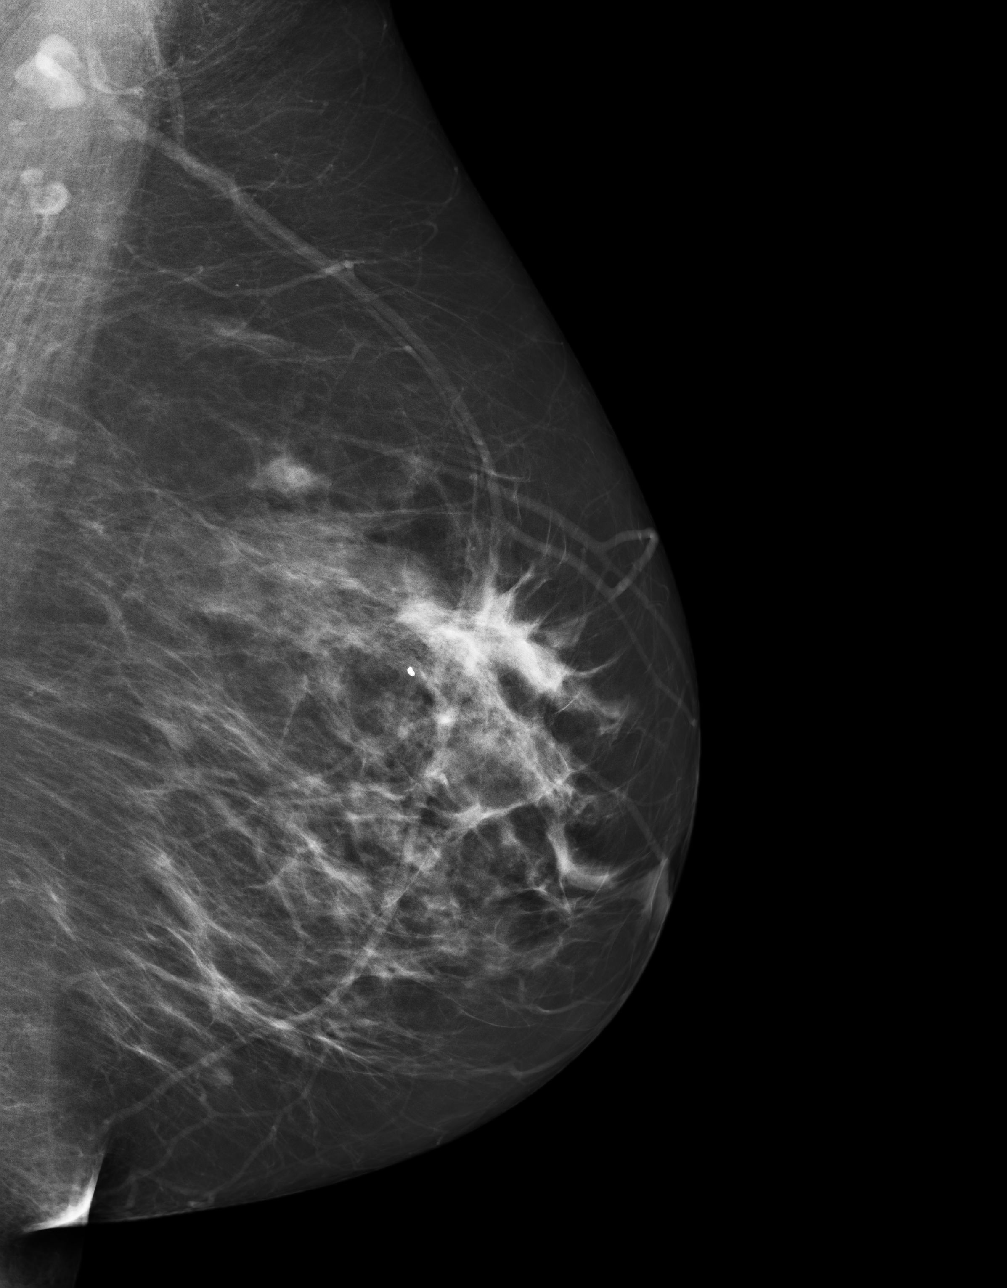

[4 of 4 positions shown; findings below may reference images not displayed]

ACR Breast Density Category c: The breast tissue is heterogeneously
dense, which may obscure small masses.
FINDINGS: There are no findings suspicious for malignancy. Images were
processed with CAD.
IMPRESSION: No mammographic evidence of malignancy. A result letter of this
screening mammogram will be mailed directly to the patient.

RECOMMENDATION:
Screening mammogram in one year. (Code:YJ-[id number])

BI-RADS CATEGORY  1: Negative.

## 2018-07-08 ENCOUNTER — Encounter: Admit: 2018-07-08 | Discharge: 2018-07-09 | Payer: MEDICARE

## 2018-07-08 DIAGNOSIS — R300 Dysuria: Secondary | ICD-10-CM

## 2018-07-08 DIAGNOSIS — R319 Hematuria, unspecified: Principal | ICD-10-CM

## 2018-07-08 DIAGNOSIS — N3010 Interstitial cystitis (chronic) without hematuria: Secondary | ICD-10-CM

## 2018-07-08 DIAGNOSIS — N393 Stress incontinence (female) (male): Secondary | ICD-10-CM

## 2018-07-08 DIAGNOSIS — R102 Pelvic and perineal pain: Secondary | ICD-10-CM

## 2018-07-08 MED ORDER — MIRABEGRON ER 50 MG TABLET,EXTENDED RELEASE 24 HR
ORAL_TABLET | Freq: Every day | ORAL | 11 refills | 0.00000 days | Status: CP
Start: 2018-07-08 — End: 2018-07-20

## 2018-07-08 MED ORDER — TAMSULOSIN 0.4 MG CAPSULE
ORAL_CAPSULE | Freq: Every day | ORAL | 11 refills | 0.00000 days | Status: CP
Start: 2018-07-08 — End: ?

## 2018-07-20 ENCOUNTER — Encounter: Admit: 2018-07-20 | Discharge: 2018-07-21 | Payer: MEDICARE

## 2018-07-20 ENCOUNTER — Encounter
Admit: 2018-07-20 | Discharge: 2018-07-21 | Payer: MEDICARE | Attending: Hematology & Oncology | Primary: Hematology & Oncology

## 2018-07-20 ENCOUNTER — Ambulatory Visit: Admit: 2018-07-20 | Discharge: 2018-07-21 | Payer: MEDICARE

## 2018-07-20 DIAGNOSIS — Z1231 Encounter for screening mammogram for malignant neoplasm of breast: Principal | ICD-10-CM

## 2018-07-20 DIAGNOSIS — D45 Polycythemia vera: Principal | ICD-10-CM

## 2018-07-20 DIAGNOSIS — G8929 Other chronic pain: Secondary | ICD-10-CM

## 2018-07-20 DIAGNOSIS — R911 Solitary pulmonary nodule: Secondary | ICD-10-CM

## 2018-07-20 DIAGNOSIS — R05 Cough: Secondary | ICD-10-CM

## 2018-07-20 DIAGNOSIS — F17200 Nicotine dependence, unspecified, uncomplicated: Secondary | ICD-10-CM

## 2018-07-20 DIAGNOSIS — F329 Major depressive disorder, single episode, unspecified: Secondary | ICD-10-CM

## 2018-07-20 DIAGNOSIS — R918 Other nonspecific abnormal finding of lung field: Secondary | ICD-10-CM

## 2018-07-20 MED ORDER — VILAZODONE 40 MG TABLET
ORAL_TABLET | Freq: Two times a day (BID) | ORAL | 0 refills | 0 days | Status: CP
Start: 2018-07-20 — End: ?

## 2018-07-20 MED ORDER — LATUDA 80 MG TABLET
ORAL_TABLET | Freq: Every day | ORAL | 0 refills | 0.00000 days | Status: CP
Start: 2018-07-20 — End: 2018-08-23

## 2018-08-23 MED ORDER — LATUDA 80 MG TABLET
ORAL_TABLET | 0 refills | 0 days | Status: CP
Start: 2018-08-23 — End: ?

## 2018-11-18 ENCOUNTER — Encounter: Admit: 2018-11-18 | Discharge: 2018-11-19 | Payer: MEDICARE

## 2018-11-18 DIAGNOSIS — R0781 Pleurodynia: Principal | ICD-10-CM

## 2018-12-14 ENCOUNTER — Encounter: Payer: Self-pay | Admitting: Emergency Medicine

## 2018-12-14 ENCOUNTER — Other Ambulatory Visit: Payer: Self-pay

## 2018-12-14 ENCOUNTER — Emergency Department
Admission: EM | Admit: 2018-12-14 | Discharge: 2018-12-14 | Disposition: A | Discharge status: Home / Self Care | Payer: Medicare Other | Attending: Emergency Medicine | Admitting: Emergency Medicine

## 2018-12-14 ENCOUNTER — Emergency Department: Payer: Medicare Other

## 2018-12-14 DIAGNOSIS — J449 Chronic obstructive pulmonary disease, unspecified: Secondary | ICD-10-CM | POA: Insufficient documentation

## 2018-12-14 DIAGNOSIS — Z87891 Personal history of nicotine dependence: Secondary | ICD-10-CM | POA: Insufficient documentation

## 2018-12-14 DIAGNOSIS — E1022 Type 1 diabetes mellitus with diabetic chronic kidney disease: Secondary | ICD-10-CM | POA: Diagnosis not present

## 2018-12-14 DIAGNOSIS — Z96652 Presence of left artificial knee joint: Secondary | ICD-10-CM | POA: Diagnosis not present

## 2018-12-14 DIAGNOSIS — R079 Chest pain, unspecified: Secondary | ICD-10-CM

## 2018-12-14 DIAGNOSIS — N189 Chronic kidney disease, unspecified: Secondary | ICD-10-CM | POA: Diagnosis not present

## 2018-12-14 DIAGNOSIS — I129 Hypertensive chronic kidney disease with stage 1 through stage 4 chronic kidney disease, or unspecified chronic kidney disease: Secondary | ICD-10-CM | POA: Diagnosis not present

## 2018-12-14 LAB — CBC
HCT: 43.7 % (ref 36.0–46.0)
Hemoglobin: 13 g/dL (ref 12.0–15.0)
MCH: 20.6 pg — ABNORMAL LOW (ref 26.0–34.0)
MCHC: 29.7 g/dL — ABNORMAL LOW (ref 30.0–36.0)
MCV: 69.1 fL — ABNORMAL LOW (ref 80.0–100.0)
Platelets: 472 10*3/uL — ABNORMAL HIGH (ref 150–400)
RBC: 6.32 MIL/uL — ABNORMAL HIGH (ref 3.87–5.11)
RDW: 19.9 % — ABNORMAL HIGH (ref 11.5–15.5)
WBC: 11.7 10*3/uL — AB (ref 4.0–10.5)
nRBC: 0 % (ref 0.0–0.2)

## 2018-12-14 LAB — BASIC METABOLIC PANEL
Anion gap: 7 (ref 5–15)
BUN: 11 mg/dL (ref 6–20)
CO2: 23 mmol/L (ref 22–32)
Calcium: 9 mg/dL (ref 8.9–10.3)
Chloride: 108 mmol/L (ref 98–111)
Creatinine, Ser: 0.68 mg/dL (ref 0.44–1.00)
GFR calc Af Amer: >60 mL/min (ref >60)
GFR calc non Af Amer: >60 mL/min (ref >60)
Glucose, Bld: 93 mg/dL (ref 70–99)
Potassium: 3.7 mmol/L (ref 3.5–5.1)
Sodium: 138 mmol/L (ref 135–145)

## 2018-12-14 LAB — TROPONIN I: Troponin I: <0.03 ng/mL (ref <0.03)

## 2018-12-14 MED ORDER — IOHEXOL 350 MG/ML SOLN
75.0000 mL | Freq: Once | INTRAVENOUS | Status: AC | PRN
  Administered 2018-12-14: 75 mL via INTRAVENOUS
  Filled 2018-12-14: qty 75

## 2018-12-14 MED ORDER — HYDROMORPHONE HCL 1 MG/ML IJ SOLN
1.0000 mg | Freq: Once | INTRAMUSCULAR | Status: AC
  Administered 2018-12-14: 1 mg via INTRAVENOUS
  Filled 2018-12-14: qty 1

## 2018-12-14 MED ORDER — IOHEXOL 350 MG/ML SOLN
75.0000 mL | Freq: Once | INTRAVENOUS | Status: DC | PRN
  Filled 2018-12-14: qty 75

## 2018-12-14 NOTE — ED Provider Notes (Signed)
[facility] Emergency Department Provider Note       Time seen: ----------------------------------------- 4:45 PM on 12/14/2018 -----------------------------------------   I have reviewed the triage vital signs and the nursing notes.  HISTORY   Chief Complaint Chest Pain    HPI Lauren Escobar is a 53 y.o. female with a history of anxiety, asthma, polycythemia, chronic kidney disease, depression, diabetes, hypertension who presents to the ED for the right-sided chest pain for several days.  Breathing and movement seems to make the pain worse.  She does report rib fracture several months ago but this pain is in a different location.  She denies fevers, chills or other complaints.  Past Medical History:  Diagnosis Date  . Anxiety   . Asthma   . Bleeding disorder ([place])   . Blood dyscrasia    polycythemia vera  . Chronic kidney disease   . Depression   . Diabetes mellitus without complication ([place])   . Headache 2000  . Horizontal nystagmus    age 43  . Hypertension   . Von Willebrand's disease [facility])     Patient Active Problem List   Diagnosis Date Noted  . Melena 06/17/2017  . GI bleed 06/16/2017  . Pain in joint, shoulder region (secondary) (right greater than left) 04/16/2017  . Knee pain (primary) (greater than right) 04/16/2017  . Pain due to interstitial cystitis (tertiary) 04/16/2017  . COPD (chronic obstructive pulmonary disease) ([place]) 04/15/2017  . Depression 04/15/2017  . GERD (gastroesophageal reflux disease) 04/15/2017  . Migraines 04/15/2017  . Chronic pain syndrome 04/15/2017  . Long term current use of opiate analgesic 04/15/2017  . Long term prescription opiate use 04/15/2017  . Opiate use 04/15/2017  . Urinary retention 02/05/2017  . Bladder outlet obstruction 01/21/2017  . Early satiety 12/19/2016  . Epigastric pain 11/21/2016  . Diarrhea, unspecified 11/21/2016  . Dysuria 11/21/2016  . 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 Urinary tract infection 09/27/2016  . Chest pain, unspecified 07/31/2016  . S/P cardiac cath 07/31/2016  . Unstable angina ([place]) 07/12/2016  . Balance problem 12/27/2015  . Chronic hip pain (tertiary) (bilateral) (left greater than right) 08/09/2015  . Abdominal pain, acute, right lower quadrant 05/18/2015  . Polycythemia vera ([place]) 04/20/2015  . Pain medication agreement signed 04/20/2015  . Candidiasis of female genitalia 04/05/2015  . Benign essential hypertension 03/31/2015  . Mechanical breakdown of implanted electronic neurostimulator of peripheral nerve ([place]) 01/28/2015  . Mixed hyperlipidemia 08/29/2014  . Asthma without status asthmaticus 07/28/2014  . Detrusor instability 07/28/2014  . Chest pain 07/15/2014  . Chronic cough 07/15/2014  . Chronic daily headache 07/15/2014  . 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 Headache disorder 07/15/2014  . Nystagmus, congenital 07/15/2014  . Type I diabetes mellitus ([place]) 05/23/2014  . Diabetic peripheral neuropathy ([place]) 06/25/2013  . Chronic cystitis 03/25/2013  . [place] of bladder 02/18/2013  . Urinary incontinence without sensory awareness 02/18/2013  . Female stress incontinence 01/20/2013  . Functional disorder of bladder 01/20/2013  . Urge incontinence 01/20/2013  . Chronic interstitial cystitis 07/02/2012  . Dyspareunia 07/02/2012  . Gross hematuria 07/02/2012  . Incomplete emptying of bladder 07/02/2012  . Mixed urge and stress incontinence 07/02/2012  . Increased frequency of urination 07/02/2012  . Von Willebrand's disease ([place]) 07/02/2012    Past Surgical History:  Procedure Laterality Date  . ABDOMINAL HYSTERECTOMY  2002   oophorectomy  . ABDOMINAL HYSTERECTOMY    . APPENDECTOMY    . BLADDER SURGERY    .  BREAST BIOPSY Left    NEG  . CARDIAC CATHETERIZATION    . CARDIAC CATHETERIZATION N/A 07/17/2016   Procedure: Left Heart Cath and Coronary Angiography;  Surgeon: Corey Skains, MD;  Location: [place] CV LAB;  Service: Cardiovascular;  Laterality: N/A;  . COLONOSCOPY WITH PROPOFOL N/A 07/27/2015   Procedure: COLONOSCOPY WITH PROPOFOL;  Surgeon: Manya Silvas, MD;  Location: [facility] ENDOSCOPY;  Service: Endoscopy;  Laterality: N/A;  . ESOPHAGOGASTRODUODENOSCOPY N/A 06/18/2017   Procedure: ESOPHAGOGASTRODUODENOSCOPY (EGD);  Surgeon: Lin Landsman, MD;  Location[facility] ENDOSCOPY;  Service: Gastroenterology;  Laterality: N/A;  . REPLACEMENT TOTAL KNEE Left   . SHOULDER SURGERY Left    x 2    Allergies Aspirin; Pregabalin; and Tramadol  Social History Social History   Tobacco Use  . Smoking status: Former Smoker    Last attempt to quit: 06/18/2014    Years since quitting: 4.4  . Smokeless tobacco: Never Used  Substance Use Topics  . Alcohol use: No    Alcohol/week: 0.0 standard drinks  . Drug use: No   Review of Systems Constitutional: Negative for fever. Cardiovascular: Positive for chest pain Respiratory: Positive for pain with breathing Gastrointestinal: Negative for abdominal pain, vomiting and diarrhea. Musculoskeletal: Negative for back pain. Skin: Negative for rash. Neurological: Negative for headaches, focal weakness or numbness.  All systems negative/normal/unremarkable except as stated in the HPI  ____________________________________________   PHYSICAL EXAM:  VITAL SIGNS: ED Triage Vitals  Enc Vitals Group     BP 12/14/18 1336 (!) 146/84     Pulse Rate 12/14/18 1336 80     Resp 12/14/18 1336 16     Temp 12/14/18 1336 (!) 97.4 F (36.3 C)     Temp Source 12/14/18 1336 Oral     SpO2 12/14/18 1336 100 %     Weight --      Height --      Head Circumference --      Peak Flow --      Pain Score 12/14/18 1337 10     Pain Loc --      Pain Edu? --      Excl. in [place]? --    Constitutional: Alert and oriented.  Mild to moderate distress from pain Eyes: Conjunctivae are normal. Normal extraocular  movements. Cardiovascular: Normal rate, regular rhythm. No murmurs, rubs, or gallops. Respiratory: Splinting with clear breath sounds bilaterally Gastrointestinal: Soft and nontender. Normal bowel sounds Musculoskeletal: Nontender with normal range of motion in extremities. No lower extremity tenderness nor edema. Neurologic:  Normal speech and language. No gross focal neurologic deficits are appreciated.  Skin:  Skin is warm, dry and intact. No rash noted. Psychiatric: Mood and affect are normal. Speech and behavior are normal.  ____________________________________________  EKG: Interpreted by me.  Sinus rhythm with a rate of 78 bpm, normal PR interval, normal QRS, normal QT  ____________________________________________  ED COURSE:  As part of my medical decision making, I reviewed the following data within the [place] History obtained from family if available, nursing notes, old chart and ekg, as well as notes from prior ED visits. Patient presented for severe right-sided chest pain, we will assess with labs and imaging as indicated at this time.   Procedures ____________________________________________   LABS (pertinent positives/negatives)  Labs Reviewed  CBC - Abnormal; Notable for the following components:      Result Value   WBC 11.7 (*)    RBC 6.32 (*)    MCV  69.1 (*)    MCH 20.6 (*)    MCHC 29.7 (*)    RDW 19.9 (*)    Platelets 472 (*)    All other components within normal limits  BASIC METABOLIC PANEL  TROPONIN I    RADIOLOGY Images were viewed by me  CTA chest  IMPRESSION: 1. No CT findings for pulmonary embolism. 2. Normal thoracic aorta. 3. No acute pulmonary findings or worrisome pulmonary lesions. 4. Several healing right rib fractures.  Aortic Atherosclerosis (ICD10-I70.0). ____________________________________________   DIFFERENTIAL DIAGNOSIS   Musculoskeletal pain, anxiety, spasm, GERD, MI, PE, dissection  FINAL ASSESSMENT  AND PLAN  Chest pain   Plan: The patient had presented for right-sided chest pain. Patient's labs are reassuring. Patient's imaging did not reveal any acute process or explanation for her right-sided chest pain.  She is cleared for outpatient follow-up.   Laurence Aly, MD    Note: This note was generated in part or whole with voice recognition software. Voice recognition is usually quite accurate but there are transcription errors that can and very often do occur. I apologize for any typographical errors that were not detected and corrected.     [name], MD 12/14/18 [phone]

## 2018-12-14 NOTE — ED Triage Notes (Signed)
Pt c/o RT sided CP xfew days, pt holding chest while triaging. PT states recent rib fractures. PT ambulatory. VSS

## 2018-12-14 NOTE — ED Notes (Signed)
Pt refuses wheelchair.

## 2018-12-21 ENCOUNTER — Telehealth: Payer: Self-pay | Admitting: *Deleted

## 2018-12-21 NOTE — Telephone Encounter (Signed)
Received referral for lung cancer screening. Contacted patient and notified her that she will not be eligible for lung screening until she turns 53 years old. However, she does have a recent CT scan of the chest and reports that she is followed with imaging at another facility.   Patient is informed that I will follow her and contact her when she turns 53 years old for consideration of screening at that time.

## 2019-01-01 ENCOUNTER — Ambulatory Visit: Admit: 2019-01-01 | Discharge: 2019-01-02 | Payer: MEDICARE

## 2019-01-01 ENCOUNTER — Encounter: Admit: 2019-01-01 | Discharge: 2019-01-02 | Payer: MEDICARE | Attending: Urology | Primary: Urology

## 2019-01-01 DIAGNOSIS — Z789 Other specified health status: Principal | ICD-10-CM

## 2019-01-01 DIAGNOSIS — R319 Hematuria, unspecified: Principal | ICD-10-CM

## 2019-01-01 DIAGNOSIS — R768 Other specified abnormal immunological findings in serum: Principal | ICD-10-CM

## 2019-01-01 DIAGNOSIS — N312 Flaccid neuropathic bladder, not elsewhere classified: Principal | ICD-10-CM

## 2019-01-01 DIAGNOSIS — Z0289 Encounter for other administrative examinations: Principal | ICD-10-CM

## 2019-01-01 DIAGNOSIS — R310 Gross hematuria: Principal | ICD-10-CM

## 2019-01-01 DIAGNOSIS — K259 Gastric ulcer, unspecified as acute or chronic, without hemorrhage or perforation: Principal | ICD-10-CM

## 2019-01-01 DIAGNOSIS — N809 Endometriosis, unspecified: Principal | ICD-10-CM

## 2019-01-01 DIAGNOSIS — S2241XA Multiple fractures of ribs, right side, initial encounter for closed fracture: Principal | ICD-10-CM

## 2019-01-01 DIAGNOSIS — M419 Scoliosis, unspecified: Principal | ICD-10-CM

## 2019-01-01 DIAGNOSIS — R911 Solitary pulmonary nodule: Principal | ICD-10-CM

## 2019-01-01 DIAGNOSIS — F419 Anxiety disorder, unspecified: Principal | ICD-10-CM

## 2019-01-01 DIAGNOSIS — R198 Other specified symptoms and signs involving the digestive system and abdomen: Principal | ICD-10-CM

## 2019-01-01 DIAGNOSIS — D680 Von Willebrand's disease: Principal | ICD-10-CM

## 2019-01-01 DIAGNOSIS — D45 Polycythemia vera: Principal | ICD-10-CM

## 2019-01-01 DIAGNOSIS — K219 Gastro-esophageal reflux disease without esophagitis: Principal | ICD-10-CM

## 2019-01-01 DIAGNOSIS — N3020 Other chronic cystitis without hematuria: Principal | ICD-10-CM

## 2019-01-01 DIAGNOSIS — J449 Chronic obstructive pulmonary disease, unspecified: Principal | ICD-10-CM

## 2019-01-01 DIAGNOSIS — F329 Major depressive disorder, single episode, unspecified: Principal | ICD-10-CM

## 2019-01-01 DIAGNOSIS — N318 Other neuromuscular dysfunction of bladder: Principal | ICD-10-CM

## 2019-01-01 DIAGNOSIS — J45909 Unspecified asthma, uncomplicated: Principal | ICD-10-CM

## 2019-01-01 DIAGNOSIS — N3942 Incontinence without sensory awareness: Principal | ICD-10-CM

## 2019-01-01 DIAGNOSIS — Z9889 Other specified postprocedural states: Principal | ICD-10-CM

## 2019-01-01 DIAGNOSIS — E119 Type 2 diabetes mellitus without complications: Principal | ICD-10-CM

## 2019-01-01 DIAGNOSIS — R350 Frequency of micturition: Principal | ICD-10-CM

## 2019-01-01 DIAGNOSIS — R339 Retention of urine, unspecified: Principal | ICD-10-CM

## 2019-01-01 DIAGNOSIS — N393 Stress incontinence (female) (male): Principal | ICD-10-CM

## 2019-01-01 DIAGNOSIS — N3010 Interstitial cystitis (chronic) without hematuria: Principal | ICD-10-CM

## 2019-01-01 DIAGNOSIS — N3941 Urge incontinence: Principal | ICD-10-CM

## 2019-01-01 DIAGNOSIS — IMO0002 Dyspareunia: Principal | ICD-10-CM

## 2019-01-18 ENCOUNTER — Encounter: Admit: 2019-01-18 | Discharge: 2019-01-18 | Payer: MEDICARE

## 2019-01-18 ENCOUNTER — Ambulatory Visit: Admit: 2019-01-18 | Discharge: 2019-01-18 | Payer: MEDICARE

## 2019-01-18 ENCOUNTER — Encounter
Admit: 2019-01-18 | Discharge: 2019-01-18 | Payer: MEDICARE | Attending: Hematology & Oncology | Primary: Hematology & Oncology

## 2019-01-18 DIAGNOSIS — F329 Major depressive disorder, single episode, unspecified: Principal | ICD-10-CM

## 2019-01-18 DIAGNOSIS — Z79899 Other long term (current) drug therapy: Principal | ICD-10-CM

## 2019-01-18 DIAGNOSIS — N3942 Incontinence without sensory awareness: Principal | ICD-10-CM

## 2019-01-18 DIAGNOSIS — Z9889 Other specified postprocedural states: Principal | ICD-10-CM

## 2019-01-18 DIAGNOSIS — E785 Hyperlipidemia, unspecified: Principal | ICD-10-CM

## 2019-01-18 DIAGNOSIS — M419 Scoliosis, unspecified: Principal | ICD-10-CM

## 2019-01-18 DIAGNOSIS — R310 Gross hematuria: Principal | ICD-10-CM

## 2019-01-18 DIAGNOSIS — F419 Anxiety disorder, unspecified: Principal | ICD-10-CM

## 2019-01-18 DIAGNOSIS — D45 Polycythemia vera: Principal | ICD-10-CM

## 2019-01-18 DIAGNOSIS — J449 Chronic obstructive pulmonary disease, unspecified: Principal | ICD-10-CM

## 2019-01-18 DIAGNOSIS — Z789 Other specified health status: Principal | ICD-10-CM

## 2019-01-18 DIAGNOSIS — N393 Stress incontinence (female) (male): Principal | ICD-10-CM

## 2019-01-18 DIAGNOSIS — R911 Solitary pulmonary nodule: Principal | ICD-10-CM

## 2019-01-18 DIAGNOSIS — N3020 Other chronic cystitis without hematuria: Principal | ICD-10-CM

## 2019-01-18 DIAGNOSIS — F17200 Nicotine dependence, unspecified, uncomplicated: Principal | ICD-10-CM

## 2019-01-18 DIAGNOSIS — R198 Other specified symptoms and signs involving the digestive system and abdomen: Principal | ICD-10-CM

## 2019-01-18 DIAGNOSIS — Z87891 Personal history of nicotine dependence: Principal | ICD-10-CM

## 2019-01-18 DIAGNOSIS — R350 Frequency of micturition: Principal | ICD-10-CM

## 2019-01-18 DIAGNOSIS — K259 Gastric ulcer, unspecified as acute or chronic, without hemorrhage or perforation: Principal | ICD-10-CM

## 2019-01-18 DIAGNOSIS — N3010 Interstitial cystitis (chronic) without hematuria: Principal | ICD-10-CM

## 2019-01-18 DIAGNOSIS — D680 Von Willebrand's disease: Principal | ICD-10-CM

## 2019-01-18 DIAGNOSIS — N312 Flaccid neuropathic bladder, not elsewhere classified: Principal | ICD-10-CM

## 2019-01-18 DIAGNOSIS — IMO0002 Dyspareunia: Principal | ICD-10-CM

## 2019-01-18 DIAGNOSIS — D414 Neoplasm of uncertain behavior of bladder: Principal | ICD-10-CM

## 2019-01-18 DIAGNOSIS — Z0289 Encounter for other administrative examinations: Principal | ICD-10-CM

## 2019-01-18 DIAGNOSIS — R339 Retention of urine, unspecified: Principal | ICD-10-CM

## 2019-01-18 DIAGNOSIS — N318 Other neuromuscular dysfunction of bladder: Principal | ICD-10-CM

## 2019-01-18 DIAGNOSIS — Z7951 Long term (current) use of inhaled steroids: Principal | ICD-10-CM

## 2019-01-18 DIAGNOSIS — R918 Other nonspecific abnormal finding of lung field: Principal | ICD-10-CM

## 2019-01-18 DIAGNOSIS — R768 Other specified abnormal immunological findings in serum: Principal | ICD-10-CM

## 2019-01-18 DIAGNOSIS — J45909 Unspecified asthma, uncomplicated: Principal | ICD-10-CM

## 2019-01-18 DIAGNOSIS — K219 Gastro-esophageal reflux disease without esophagitis: Principal | ICD-10-CM

## 2019-01-18 DIAGNOSIS — E119 Type 2 diabetes mellitus without complications: Principal | ICD-10-CM

## 2019-01-18 DIAGNOSIS — N809 Endometriosis, unspecified: Principal | ICD-10-CM

## 2019-01-18 DIAGNOSIS — N3941 Urge incontinence: Principal | ICD-10-CM

## 2019-01-18 DIAGNOSIS — Z1231 Encounter for screening mammogram for malignant neoplasm of breast: Principal | ICD-10-CM

## 2019-01-18 MED ORDER — IPRATROPIUM 0.5 MG-ALBUTEROL 3 MG (2.5 MG BASE)/3 ML NEBULIZATION SOLN
Freq: Four times a day (QID) | RESPIRATORY_TRACT | 11 refills | 0.00000 days | Status: CP | PRN
Start: 2019-01-18 — End: ?

## 2019-04-23 ENCOUNTER — Encounter: Admit: 2019-04-23 | Discharge: 2019-04-24 | Payer: MEDICARE

## 2019-04-23 ENCOUNTER — Ambulatory Visit
Admit: 2019-04-23 | Discharge: 2019-04-24 | Payer: MEDICARE | Attending: Hematology & Oncology | Primary: Hematology & Oncology

## 2019-04-23 DIAGNOSIS — M542 Cervicalgia: Secondary | ICD-10-CM

## 2019-04-23 DIAGNOSIS — D45 Polycythemia vera: Principal | ICD-10-CM

## 2019-04-28 ENCOUNTER — Encounter: Admit: 2019-04-28 | Discharge: 2019-04-29 | Payer: MEDICARE

## 2019-04-28 DIAGNOSIS — D45 Polycythemia vera: Principal | ICD-10-CM

## 2019-05-21 ENCOUNTER — Encounter: Payer: Self-pay | Admitting: Gastroenterology

## 2019-05-21 ENCOUNTER — Ambulatory Visit (INDEPENDENT_AMBULATORY_CARE_PROVIDER_SITE_OTHER): Payer: Medicare Other | Admitting: Gastroenterology

## 2019-05-21 ENCOUNTER — Other Ambulatory Visit: Payer: Self-pay

## 2019-05-21 VITALS — BP 125/75 | HR 87 | Temp 98.2°F | Ht 59.0 in | Wt 133.0 lb

## 2019-05-21 DIAGNOSIS — S62609A Fracture of unspecified phalanx of unspecified finger, initial encounter for closed fracture: Secondary | ICD-10-CM | POA: Insufficient documentation

## 2019-05-21 DIAGNOSIS — R634 Abnormal weight loss: Secondary | ICD-10-CM

## 2019-05-21 DIAGNOSIS — K529 Noninfective gastroenteritis and colitis, unspecified: Secondary | ICD-10-CM

## 2019-05-21 MED ORDER — AMITRIPTYLINE HCL 50 MG PO TABS: 50.0000 mg | ORAL_TABLET | Freq: Every day | ORAL | 1 refills | Status: DC

## 2019-05-21 MED ORDER — DICYCLOMINE HCL 10 MG PO CAPS: 10.0000 mg | ORAL_CAPSULE | Freq: Three times a day (TID) | ORAL | 0 refills | Status: DC

## 2019-05-21 NOTE — Progress Notes (Signed)
Cephas Darby, MD [address]  [place]  [place], [place] [address]  Main: [phone]  Fax: [phone]    Gastroenterology Consultation  Referring Provider:     Freddy Finner, NP Primary Care Physician:  Letta Median, MD Primary Gastroenterologist:  Dr. Cephas Darby Reason for Consultation:     Chronic diarrhea, weight loss, rectal bleeding        HPI:   Lauren Escobar is a 53 y.o. female referred by Dr. Rebeca Alert, Durene Cal, MD  for consultation & management of chronic diarrhea, unintentional weight loss and rectal bleeding.  Patient has history of anxiety, depression, polycythemia vera who undergoes phlebotomy as needed, followed by [facility], hematology.  Patient reports that she has chronic diarrhea but for the last 1 month she has been noticing rectal bleeding, bright red sometimes on wiping and otherwise mixed with stool.  She also notices weight loss.  She is limiting her p.o. intake due to ongoing diarrhea.  She reports having 5-6 runny bowel movements daily and sometimes during the night. Patient was tearful, appears sad, lives alone, lost her roommate/friend 1 year ago.  She said she never lived by herself.  She also reports abdominal cramps, diffuse, back pain.  She said she tried Lomotil and it did not help.  She is currently not taking buspirone and Topamax.  She denies smoking.  NSAIDs: None  Antiplts/Anticoagulants/Anti thrombotics: None  GI Procedures:   Colonoscopy 12/2009  Diagnosis:  Part A: ASCENDING COLON COLD BIOPSIES:  - NO COLITIS.  Marland Kitchen  Part B: TRANSVERSE COLON COLD BIOPSIES:  - NO COLITIS.  Marland Kitchen  Part C: SIGMOID COLON COLD BIOPSIES:  - NO COLITIS.   Colonoscopy 12/2011 Diagnosis:  DESCENDING COLON MUCOSAL COLD BIOPSY:  - COLONIC MUCOSA WITH NO PATHOLOGIC ABNORMALITY.  - NEGATIVE FOR DYSPLASIA AND MALIGNANCY.   colonoscopy by Dr. Vira Agar in 06/2015 - One diminutive polyp in the sigmoid colon. Resected and retrieved. - Internal  hemorrhoids. - The examination was otherwise normal.  DIAGNOSIS:  COLON POLYP, SIGMOID; COLD BIOPSY:  - TUBULAR ADENOMA.  - NEGATIVE FOR HIGH-GRADE DYSPLASIA AND MALIGNANCY.   EGD 06/18/2017 - Normal first portion of the duodenum, second portion of the duodenum and third portion of the duodenum. - Normal stomach. - Normal gastroesophageal junction and esophagus. - No specimens collected.  Past Medical History:  Diagnosis Date   Anxiety    Asthma    Bleeding disorder ([place])    Blood dyscrasia    polycythemia vera   Chronic kidney disease    Depression    Diabetes mellitus without complication ([place])    Headache 2000   Horizontal nystagmus    age 53   Hypertension    Von Willebrand's disease [facility])     Past Surgical History:  Procedure Laterality Date   ABDOMINAL HYSTERECTOMY  2002   oophorectomy   ABDOMINAL HYSTERECTOMY     APPENDECTOMY     BLADDER SURGERY     BREAST BIOPSY Left    NEG   CARDIAC CATHETERIZATION     CARDIAC CATHETERIZATION N/A 07/17/2016   Procedure: Left Heart Cath and Coronary Angiography;  Surgeon: Corey Skains, MD;  Location: [place] CV LAB;  Service: Cardiovascular;  Laterality: N/A;   COLONOSCOPY WITH PROPOFOL N/A 07/27/2015   Procedure: COLONOSCOPY WITH PROPOFOL;  Surgeon: Manya Silvas, MD;  Location: [facility] ENDOSCOPY;  Service: Endoscopy;  Laterality: N/A;   ESOPHAGOGASTRODUODENOSCOPY N/A 06/18/2017   Procedure: ESOPHAGOGASTRODUODENOSCOPY (EGD);  Surgeon: Lin Landsman,  MD;  Location: [facility] ENDOSCOPY;  Service: Gastroenterology;  Laterality: N/A;   REPLACEMENT TOTAL KNEE Left    SHOULDER SURGERY Left    x 2   Current Outpatient Medications:    cefUROXime (CEFTIN) 500 MG tablet, Take 1 tablet (500 mg total) by mouth 2 (two) times daily with a meal., Disp: 12 tablet, Rfl: 0   dicyclomine (BENTYL) 10 MG capsule, Take 1 capsule (10 mg total) by mouth 4 (four) times daily -  before meals and at bedtime for 60  doses., Disp: 60 capsule, Rfl: 0   esomeprazole (NEXIUM) 40 MG capsule, Take 40 mg by mouth daily. , Disp: , Rfl:    Fluticasone-Salmeterol (ADVAIR DISKUS) 250-50 MCG/DOSE AEPB, Inhale 1 puff into the lungs 2 (two) times daily. , Disp: , Rfl:    ipratropium-albuterol (DUONEB) 0.5-2.5 (3) MG/3ML SOLN, 3 mL Q4H (RT). , Disp: , Rfl:    lurasidone (LATUDA) 40 MG TABS tablet, Take by mouth daily with breakfast. , Disp: , Rfl:    nitrofurantoin (MACRODANTIN) 100 MG capsule, Take 100 mg by mouth daily. , Disp: , Rfl:    NITROGLYCERIN SL, Place 1 tablet under the tongue., Disp: , Rfl:    tiotropium (SPIRIVA HANDIHALER) 18 MCG inhalation capsule, Frequency:BID   Dosage:18   MCG  Instructions:  Note:Dose: 18MCG, Disp: , Rfl:    amitriptyline (ELAVIL) 50 MG tablet, Take 1 tablet (50 mg total) by mouth at bedtime., Disp: 30 tablet, Rfl: 1   busPIRone (BUSPAR) 15 MG tablet, Take by mouth 2 (two) times daily. , Disp: , Rfl:    diphenoxylate-atropine (LOMOTIL) 2.5-0.025 MG per tablet, Take 2 tablets by mouth 4 (four) times daily as needed. , Disp: , Rfl:    topiramate (TOPAMAX) 50 MG tablet, Take 50 mg by mouth 2 (two) times daily. , Disp: , Rfl:    Family History  Problem Relation Age of Onset   Cancer Father        prostate   Stroke Father    Diabetes Father    Cancer Mother        bladder   Breast cancer Maternal Grandmother      Social History   Tobacco Use   Smoking status: Former Smoker    Quit date: 06/18/2014    Years since quitting: 4.9   Smokeless tobacco: Never Used  Substance Use Topics   Alcohol use: No    Alcohol/week: 0.0 standard drinks   Drug use: No    Allergies as of 05/21/2019 - Review Complete 05/21/2019  Allergen Reaction Noted   Aspirin  06/16/2017   Pregabalin Other (See Comments) 04/21/2015   Tramadol Rash 12/12/2015    Review of Systems:    All systems reviewed and negative except where noted in HPI.   Physical Exam:  BP 125/75     Pulse 87    Temp 98.2 F (36.8 C)    Ht 4\' 11"  (1.499 m)    Wt 133 lb (60.3 kg)    BMI 26.86 kg/m  No LMP recorded. Patient has had a hysterectomy.  General:   Alert,  Well-developed, well-nourished, pleasant and cooperative in NAD Head:  Normocephalic and atraumatic. Eyes:  Sclera clear, no icterus.   Conjunctiva pink. Ears:  Normal auditory acuity. Nose:  No deformity, discharge, or lesions. Mouth:  No deformity or lesions,oropharynx pink & moist. Neck:  Supple; no masses or thyromegaly. Lungs:  Respirations even and unlabored.  Clear throughout to auscultation.   No wheezes, crackles, or rhonchi.  No acute distress. Heart:  Regular rate and rhythm; no murmurs, clicks, rubs, or gallops. Abdomen:  Normal bowel sounds. Soft, non-tender and non-distended without masses, hepatosplenomegaly or hernias noted.  No guarding or rebound tenderness.   Rectal: Not performed Msk:  Symmetrical without gross deformities. Good, equal movement & strength bilaterally. Pulses:  Normal pulses noted. Extremities:  No clubbing or edema.  No cyanosis. Neurologic:  Alert and oriented x3;  grossly normal neurologically. Skin:  Intact without significant lesions or rashes. No jaundice. Psych:  Alert and cooperative. Normal mood and affect.  Imaging Studies: Reviewed  Assessment and Plan:   JAMARA VARY is a 53 y.o. female with history of untreated anxiety, depression, lives alone with chronic diarrhea in 1 month history of worsening abdominal cramps, diarrhea, rectal bleeding, weight loss  Recommend stool studies to rule out infection Check pancreatic fecal elastase, fecal calprotectin levels, CRP, celiac serologies Trial of Bentyl 10 mg before each meal and at bedtime We will try amitriptyline 50 mg at bedtime Recommend EGD and colonoscopy with duodenal, gastric and colon biopsies   Follow up in 2 to 3 weeks   Cephas Darby, MD

## 2019-05-24 ENCOUNTER — Other Ambulatory Visit: Payer: Self-pay

## 2019-05-24 ENCOUNTER — Other Ambulatory Visit: Payer: Self-pay | Admitting: Gastroenterology

## 2019-05-24 DIAGNOSIS — K529 Noninfective gastroenteritis and colitis, unspecified: Secondary | ICD-10-CM

## 2019-05-24 DIAGNOSIS — R634 Abnormal weight loss: Secondary | ICD-10-CM

## 2019-05-26 ENCOUNTER — Telehealth: Payer: Self-pay

## 2019-05-26 LAB — C-REACTIVE PROTEIN: CRP: <1 mg/L (ref 0–10)

## 2019-05-26 LAB — TISSUE TRANSGLUTAMINASE, IGA: Transglutaminase IgA: <2 U/mL (ref 0–3)

## 2019-05-26 LAB — IGA: IgA/Immunoglobulin A, Serum: 183 mg/dL (ref 87–352)

## 2019-05-26 NOTE — Telephone Encounter (Signed)
Pt notified of lab results

## 2019-05-26 NOTE — Telephone Encounter (Signed)
-----   Message from Lin Landsman, MD sent at 05/26/2019 10:22 AM EDT ----- Normal labs so far  RV

## 2019-05-27 ENCOUNTER — Other Ambulatory Visit: Payer: Self-pay

## 2019-05-27 ENCOUNTER — Other Ambulatory Visit
Admission: RE | Admit: 2019-05-27 | Discharge: 2019-05-27 | Disposition: A | Discharge status: Home / Self Care | Payer: Medicare Other | Source: Ambulatory Visit | Attending: Gastroenterology | Admitting: Gastroenterology

## 2019-05-27 DIAGNOSIS — Z20828 Contact with and (suspected) exposure to other viral communicable diseases: Secondary | ICD-10-CM | POA: Insufficient documentation

## 2019-05-27 LAB — SPECIMEN STATUS REPORT

## 2019-05-27 LAB — SARS CORONAVIRUS 2 (TAT 6-24 HRS): SARS Coronavirus 2: NEGATIVE

## 2019-05-28 ENCOUNTER — Other Ambulatory Visit: Payer: Self-pay

## 2019-05-28 ENCOUNTER — Other Ambulatory Visit: Payer: Self-pay | Admitting: Gastroenterology

## 2019-05-28 DIAGNOSIS — K529 Noninfective gastroenteritis and colitis, unspecified: Secondary | ICD-10-CM

## 2019-05-28 MED ORDER — SUPREP BOWEL PREP KIT 17.5-3.13-1.6 GM/177ML PO SOLN: 1.0000 | ORAL | 0 refills | Status: DC

## 2019-05-31 ENCOUNTER — Ambulatory Visit: Payer: Medicare Other | Admitting: Anesthesiology

## 2019-05-31 ENCOUNTER — Ambulatory Visit
Admission: RE | Admit: 2019-05-31 | Discharge: 2019-05-31 | Disposition: A | Discharge status: Home / Self Care | Payer: Medicare Other | Attending: Gastroenterology | Admitting: Gastroenterology

## 2019-05-31 ENCOUNTER — Encounter: Payer: Self-pay | Admitting: Anesthesiology

## 2019-05-31 ENCOUNTER — Other Ambulatory Visit: Payer: Self-pay

## 2019-05-31 ENCOUNTER — Encounter
Admission: RE | Disposition: A | Discharge status: Home / Self Care | Payer: Self-pay | Source: Home / Self Care | Attending: Gastroenterology

## 2019-05-31 DIAGNOSIS — E1122 Type 2 diabetes mellitus with diabetic chronic kidney disease: Secondary | ICD-10-CM | POA: Insufficient documentation

## 2019-05-31 DIAGNOSIS — F419 Anxiety disorder, unspecified: Secondary | ICD-10-CM | POA: Insufficient documentation

## 2019-05-31 DIAGNOSIS — I2510 Atherosclerotic heart disease of native coronary artery without angina pectoris: Secondary | ICD-10-CM | POA: Insufficient documentation

## 2019-05-31 DIAGNOSIS — Z96652 Presence of left artificial knee joint: Secondary | ICD-10-CM | POA: Insufficient documentation

## 2019-05-31 DIAGNOSIS — R634 Abnormal weight loss: Secondary | ICD-10-CM | POA: Diagnosis not present

## 2019-05-31 DIAGNOSIS — R197 Diarrhea, unspecified: Secondary | ICD-10-CM

## 2019-05-31 DIAGNOSIS — N189 Chronic kidney disease, unspecified: Secondary | ICD-10-CM | POA: Diagnosis not present

## 2019-05-31 DIAGNOSIS — J449 Chronic obstructive pulmonary disease, unspecified: Secondary | ICD-10-CM | POA: Insufficient documentation

## 2019-05-31 DIAGNOSIS — Z6826 Body mass index (BMI) 26.0-26.9, adult: Secondary | ICD-10-CM | POA: Diagnosis not present

## 2019-05-31 DIAGNOSIS — Z7951 Long term (current) use of inhaled steroids: Secondary | ICD-10-CM | POA: Insufficient documentation

## 2019-05-31 DIAGNOSIS — K529 Noninfective gastroenteritis and colitis, unspecified: Secondary | ICD-10-CM | POA: Diagnosis not present

## 2019-05-31 DIAGNOSIS — D680 Von Willebrand's disease: Secondary | ICD-10-CM | POA: Insufficient documentation

## 2019-05-31 DIAGNOSIS — Z79899 Other long term (current) drug therapy: Secondary | ICD-10-CM | POA: Diagnosis not present

## 2019-05-31 DIAGNOSIS — I129 Hypertensive chronic kidney disease with stage 1 through stage 4 chronic kidney disease, or unspecified chronic kidney disease: Secondary | ICD-10-CM | POA: Diagnosis not present

## 2019-05-31 DIAGNOSIS — D126 Benign neoplasm of colon, unspecified: Secondary | ICD-10-CM | POA: Insufficient documentation

## 2019-05-31 DIAGNOSIS — K210 Gastro-esophageal reflux disease with esophagitis: Secondary | ICD-10-CM | POA: Diagnosis not present

## 2019-05-31 DIAGNOSIS — Z87891 Personal history of nicotine dependence: Secondary | ICD-10-CM | POA: Diagnosis not present

## 2019-05-31 DIAGNOSIS — K644 Residual hemorrhoidal skin tags: Secondary | ICD-10-CM | POA: Diagnosis not present

## 2019-05-31 DIAGNOSIS — F329 Major depressive disorder, single episode, unspecified: Secondary | ICD-10-CM | POA: Diagnosis not present

## 2019-05-31 HISTORY — PX: ESOPHAGOGASTRODUODENOSCOPY (EGD) WITH PROPOFOL: SHX5813

## 2019-05-31 HISTORY — PX: COLONOSCOPY WITH PROPOFOL: SHX5780

## 2019-05-31 SURGERY — COLONOSCOPY WITH PROPOFOL
Anesthesia: General

## 2019-05-31 MED ORDER — LIDOCAINE HCL (PF) 2 % IJ SOLN
INTRAMUSCULAR | Status: AC
  Filled 2019-05-31: qty 10

## 2019-05-31 MED ORDER — FENTANYL CITRATE (PF) 100 MCG/2ML IJ SOLN: 25.0000 ug | INTRAMUSCULAR | Status: DC | PRN

## 2019-05-31 MED ORDER — PROPOFOL 500 MG/50ML IV EMUL
INTRAVENOUS | Status: AC
  Filled 2019-05-31: qty 50

## 2019-05-31 MED ORDER — PROPOFOL 500 MG/50ML IV EMUL
INTRAVENOUS | Status: DC | PRN
  Administered 2019-05-31: 130 ug/kg/min via INTRAVENOUS

## 2019-05-31 MED ORDER — SODIUM CHLORIDE 0.9 % IV SOLN
INTRAVENOUS | Status: DC
  Administered 2019-05-31: 1000 mL via INTRAVENOUS

## 2019-05-31 MED ORDER — DICYCLOMINE HCL 20 MG PO TABS
20.0000 mg | ORAL_TABLET | Freq: Once | ORAL | Status: AC
  Administered 2019-05-31: 20 mg via ORAL
  Filled 2019-05-31: qty 1

## 2019-05-31 MED ORDER — ONDANSETRON HCL 4 MG/2ML IJ SOLN: 4.0000 mg | Freq: Once | INTRAMUSCULAR | Status: DC | PRN

## 2019-05-31 MED ORDER — PROPOFOL 10 MG/ML IV BOLUS
INTRAVENOUS | Status: DC | PRN
  Administered 2019-05-31: 50 mg via INTRAVENOUS
  Administered 2019-05-31: 80 mg via INTRAVENOUS

## 2019-05-31 NOTE — Anesthesia Postprocedure Evaluation (Signed)
Anesthesia Post Note  Patient: Lauren Escobar  Procedure(s) Performed: COLONOSCOPY WITH PROPOFOL (N/A ) ESOPHAGOGASTRODUODENOSCOPY (EGD) WITH PROPOFOL (N/A )  Patient location during evaluation: Endoscopy Anesthesia Type: General Level of consciousness: awake and alert and oriented Pain management: pain level controlled Vital Signs Assessment: post-procedure vital signs reviewed and stable Respiratory status: spontaneous breathing Cardiovascular status: blood pressure returned to baseline Anesthetic complications: no     Last Vitals:  Vitals:   05/31/19 1143 05/31/19 1210  BP: (!) 88/63 (!) 88/57  Pulse:    Resp: 16   Temp:    SpO2: 97%     Last Pain:  Vitals:   05/31/19 1230  TempSrc:   PainSc: 8                  Janathan Bribiesca

## 2019-05-31 NOTE — Anesthesia Preprocedure Evaluation (Signed)
Anesthesia Evaluation  Patient identified by MRN, date of birth, ID band Patient awake    Reviewed: Allergy & Precautions, H&P , NPO status , Patient's Chart, lab work & pertinent test results, reviewed documented beta blocker date and time   History of Anesthesia Complications Negative for: history of anesthetic complications  Airway Mallampati: III  TM Distance: >3 FB Neck ROM: full    Dental  (+) Edentulous Upper, Edentulous Lower   Pulmonary shortness of breath and with exertion, asthma , neg sleep apnea, COPD,  COPD inhaler, neg recent URI, former smoker,           Cardiovascular Exercise Tolerance: Good hypertension, + angina (stable, none recently) + CAD  (-) Past MI, (-) Cardiac Stents and (-) CABG (-) dysrhythmias (-) Valvular Problems/Murmurs     Neuro/Psych neg Seizures PSYCHIATRIC DISORDERS (Depression)  Neuromuscular disease    GI/Hepatic Neg liver ROS, GERD  ,  Endo/Other  diabetes  Renal/GU CRFRenal disease  negative genitourinary   Musculoskeletal   Abdominal   Peds  Hematology negative hematology ROS (+) Blood dyscrasia (von willebrand's and polycythemia vera), ,   Anesthesia Other Findings Past Medical History: No date: Anxiety No date: Asthma No date: Bleeding disorder (HCC) No date: Blood dyscrasia     Comment:  polycythemia vera No date: Chronic kidney disease No date: Depression No date: Diabetes mellitus without complication ([place]) 6333: Headache No date: Horizontal nystagmus     Comment:  age 53 No date: Hypertension No date: Von Willebrand's disease ([place])   Reproductive/Obstetrics negative OB ROS                             Anesthesia Physical  Anesthesia Plan  ASA: III  Anesthesia Plan: General   Post-op Pain Management:    Induction: Intravenous  PONV Risk Score and Plan: 3 and Propofol infusion  Airway Management Planned: Natural Airway and  Nasal Cannula  Additional Equipment:   Intra-op Plan:   Post-operative Plan:   Informed Consent: I have reviewed the patients History and Physical, chart, labs and discussed the procedure including the risks, benefits and alternatives for the proposed anesthesia with the patient or authorized representative who has indicated his/her understanding and acceptance.     Dental Advisory Given  Plan Discussed with: CRNA  Anesthesia Plan Comments:         Anesthesia Quick Evaluation

## 2019-05-31 NOTE — H&P (Signed)
Lauren Darby, MD [address]  [place]  [place], [place] [address]  Main: [phone]  Fax: [phone] Pager: [phone]  Primary Care Physician:  Letta Median, MD Primary Gastroenterologist:  Dr. Cephas Escobar  Pre-Procedure History & Physical: HPI:  Lauren Escobar is a 53 y.o. female is here for an endoscopy and colonoscopy.   Past Medical History:  Diagnosis Date  . Anxiety   . Asthma   . Bleeding disorder ([place])   . Blood dyscrasia    polycythemia vera  . Chronic kidney disease   . Depression   . Diabetes mellitus without complication ([place])   . Headache 2000  . Horizontal nystagmus    age 45  . Hypertension   . Von Willebrand's disease [facility])     Past Surgical History:  Procedure Laterality Date  . ABDOMINAL HYSTERECTOMY  2002   oophorectomy  . ABDOMINAL HYSTERECTOMY    . APPENDECTOMY    . BLADDER SURGERY    . BREAST BIOPSY Left    NEG  . CARDIAC CATHETERIZATION    . CARDIAC CATHETERIZATION N/A 07/17/2016   Procedure: Left Heart Cath and Coronary Angiography;  Surgeon: Corey Skains, MD;  Location: [facility];  Service: Cardiovascular;  Laterality: N/A;  . COLONOSCOPY WITH PROPOFOL N/A 07/27/2015   Procedure: COLONOSCOPY WITH PROPOFOL;  Surgeon: Manya Silvas, MD;  Location: [facility] ENDOSCOPY;  Service: Endoscopy;  Laterality: N/A;  . ESOPHAGOGASTRODUODENOSCOPY N/A 06/18/2017   Procedure: ESOPHAGOGASTRODUODENOSCOPY (EGD);  Surgeon: Lin Landsman, MD;  Location: [facility] ENDOSCOPY;  Service: Gastroenterology;  Laterality: N/A;  . REPLACEMENT TOTAL KNEE Left   . SHOULDER SURGERY Left    x 2    Prior to Admission medications   Medication Sig Start Date End Date Taking? Authorizing Provider  amitriptyline (ELAVIL) 50 MG tablet Take 1 tablet (50 mg total) by mouth at bedtime. 05/21/19 06/20/19 Yes Vanga, Tally Due, MD  busPIRone (BUSPAR) 15 MG tablet Take by mouth 2 (two) times daily.    Yes [provider]   cefUROXime (CEFTIN) 500 MG tablet Take 1 tablet (500 mg total) by mouth 2 (two) times daily with a meal. 06/18/17  Yes Mody, Sital, MD  dicyclomine (BENTYL) 10 MG capsule Take 1 capsule (10 mg total) by mouth 4 (four) times daily -  before meals and at bedtime for 60 doses. 05/21/19 06/05/19 Yes Vanga, Tally Due, MD  diphenoxylate-atropine (LOMOTIL) 2.5-0.025 MG per tablet Take 2 tablets by mouth 4 (four) times daily as needed.  07/26/14  Yes [provider]  esomeprazole (NEXIUM) 40 MG capsule Take 40 mg by mouth daily.  08/07/14  Yes [provider]  Fluticasone-Salmeterol (ADVAIR DISKUS) 250-50 MCG/DOSE AEPB Inhale 1 puff into the lungs 2 (two) times daily.  01/02/15  Yes [provider]  ipratropium-albuterol (DUONEB) 0.5-2.5 (3) MG/3ML SOLN 3 mL Q4H (RT).  07/11/14  Yes [provider]  lurasidone (LATUDA) 40 MG TABS tablet Take by mouth daily with breakfast.  04/18/15  Yes [provider]  Na Sulfate-K Sulfate-Mg Sulf (SUPREP BOWEL PREP KIT) 17.5-3.13-1.6 GM/177ML SOLN Take 1 kit by mouth as directed. 05/28/19  Yes Vanga, Tally Due, MD  nitrofurantoin (MACRODANTIN) 100 MG capsule Take 100 mg by mouth daily.    Yes [provider]  NITROGLYCERIN SL Place 1 tablet under the tongue.   Yes [provider]  tiotropium (SPIRIVA HANDIHALER) 18 MCG inhalation capsule Frequency:BID   Dosage:18   MCG  Instructions:  Note:Dose: 18MCG  09/09/12  Yes [provider]  topiramate (TOPAMAX) 50 MG tablet Take 50 mg by mouth 2 (two) times daily.  04/20/15  Yes [provider]    Allergies as of 05/24/2019 - Review Complete 05/21/2019  Allergen Reaction Noted  . Aspirin  06/16/2017  . Pregabalin Other (See Comments) 04/21/2015  . Tramadol Rash 12/12/2015    Family History  Problem Relation Age of Onset  . Cancer Father        prostate  . Stroke Father   . Diabetes Father   . Cancer Mother        bladder  . Breast cancer  Maternal Grandmother     Social History   Socioeconomic History  . Marital status: Single    Spouse name: Not on file  . Number of children: Not on file  . Years of education: Not on file  . Highest education level: Not on file  Occupational History    Comment: disabled  Social Needs  . Financial resource strain: Not on file  . Food insecurity    Worry: Not on file    Inability: Not on file  . Transportation needs    Medical: Not on file    Non-medical: Not on file  Tobacco Use  . Smoking status: Former Smoker    Quit date: 06/18/2014    Years since quitting: 4.9  . Smokeless tobacco: Never Used  Substance and Sexual Activity  . Alcohol use: No    Alcohol/week: 0.0 standard drinks  . Drug use: No  . Sexual activity: Not on file  Lifestyle  . Physical activity    Days per week: Not on file    Minutes per session: Not on file  . Stress: Not on file  Relationships  . Social Herbalist on phone: Not on file    Gets together: Not on file    Attends religious service: Not on file    Active member of club or organization: Not on file    Attends meetings of clubs or organizations: Not on file    Relationship status: Not on file  . Intimate partner violence    Fear of current or ex partner: Not on file    Emotionally abused: Not on file    Physically abused: Not on file    Forced sexual activity: Not on file  Other Topics Concern  . Not on file  Social History Narrative   single    Review of Systems: See HPI, otherwise negative ROS  Physical Exam: BP 105/68   Pulse 73   Temp (!) 96.7 F (35.9 C) (Tympanic)   Resp 20   Ht 4' 11"  (1.499 m)   Wt 60.3 kg   SpO2 100%   BMI 26.86 kg/m  General:   Alert,  pleasant and cooperative in NAD Head:  Normocephalic and atraumatic. Neck:  Supple; no masses or thyromegaly. Lungs:  Clear throughout to auscultation.    Heart:  Regular rate and rhythm. Abdomen:  Soft, nontender and nondistended. Normal bowel  sounds, without guarding, and without rebound.   Neurologic:  Alert and  oriented x4;  grossly normal neurologically.  Impression/Plan: Lauren Escobar is here for an endoscopy and colonoscopy to be performed for chronic diarrhea, weight loss  Risks, benefits, limitations, and alternatives regarding  endoscopy and colonoscopy have been reviewed with the patient.  Questions have been answered.  All parties agreeable.   Sherri Sear, MD  05/31/2019, 10:25 AM

## 2019-05-31 NOTE — OR Nursing (Signed)
Dr. Marius Ditch notified of patient's complaint 10/10 Abdominal pain.

## 2019-05-31 NOTE — Op Note (Signed)
[facility] Patient Name: Lauren Escobar Procedure Date: 05/31/2019 10:25 AM MRN: [id number] Account #: [id number] Date of Birth: 11/12/1965 Admit Type: Outpatient Age: 53 Room: [facility] ENDO ROOM 2 Gender: Female Note Status: Finalized Procedure:            Upper GI endoscopy Indications:          Diarrhea, Weight loss Providers:            Lin Landsman MD, MD Referring MD:         Baxter Kail. Rebeca Alert MD, MD (Referring MD) Medicines:            Monitored Anesthesia Care Complications:        No immediate complications. Estimated blood loss: None. Procedure:            Pre-Anesthesia Assessment:                       - Prior to the procedure, a History and Physical was                        performed, and patient medications and allergies were                        reviewed. The patient is competent. The risks and                        benefits of the procedure and the sedation options and                        risks were discussed with the patient. All questions                        were answered and informed consent was obtained.                        Patient identification and proposed procedure were                        verified by the physician, the nurse, the                        anesthesiologist, the anesthetist and the technician in                        the pre-procedure area in the procedure room in the                        endoscopy suite. Mental Status Examination: alert and                        oriented. Airway Examination: normal oropharyngeal                        airway and neck mobility. Respiratory Examination:                        clear to auscultation. CV Examination: normal.                        Prophylactic Antibiotics: The patient does not require  prophylactic antibiotics. Prior Anticoagulants: The                        patient has taken no previous anticoagulant or         antiplatelet agents. ASA Grade Assessment: III - A                        patient with severe systemic disease. After reviewing                        the risks and benefits, the patient was deemed in                        satisfactory condition to undergo the procedure. The                        anesthesia plan was to use monitored anesthesia care                        (MAC). Immediately prior to administration of                        medications, the patient was re-assessed for adequacy                        to receive sedatives. The heart rate, respiratory rate,                        oxygen saturations, blood pressure, adequacy of                        pulmonary ventilation, and response to care were                        monitored throughout the procedure. The physical status                        of the patient was re-assessed after the procedure.                       After obtaining informed consent, the endoscope was                        passed under direct vision. Throughout the procedure,                        the patient's blood pressure, pulse, and oxygen                        saturations were monitored continuously. The Endoscope                        was introduced through the mouth, and advanced to the                        second part of duodenum. The upper GI endoscopy was                        accomplished without difficulty. The patient tolerated  the procedure well. Findings:      The duodenal bulb and second portion of the duodenum were normal.       Biopsies for histology were taken with a cold forceps for evaluation of       celiac disease.      The entire examined stomach was normal. Biopsies were taken with a cold       forceps for Helicobacter pylori testing.      The cardia and gastric fundus were normal on retroflexion.      LA Grade A (one or more mucosal breaks less than 5 mm, not extending       between tops of 2  mucosal folds) esophagitis with no bleeding was found       in the lower third of the esophagus.      Esophagogastric landmarks were identified: the gastroesophageal junction       was found at 33 cm from the incisors.      The gastroesophageal junction was normal. Impression:           - Normal duodenal bulb and second portion of the                        duodenum. Biopsied.                       - Normal stomach. Biopsied.                       - LA Grade A reflux esophagitis.                       - Esophagogastric landmarks identified.                       - Normal gastroesophageal junction. Recommendation:       - Await pathology results.                       - Follow an antireflux regimen.                       - Use a proton pump inhibitor PO BID for 3 months.                       - Return to my office as previously scheduled. Procedure Code(s):    --- Professional ---                       [phone], Esophagogastroduodenoscopy, flexible, transoral;                        with biopsy, single or multiple Diagnosis Code(s):    --- Professional ---                       K21.0, Gastro-esophageal reflux disease with esophagitis                       R19.7, Diarrhea, unspecified                       R63.4, Abnormal weight loss CPT copyright 2019 American Medical Association. All rights reserved. The codes documented in this report are preliminary and upon coder review may  be  revised to meet current compliance requirements. Dr. Ulyess Mort Lin Landsman MD, MD 05/31/2019 11:18:15 AM This report has been signed electronically. Number of Addenda: 0 Note Initiated On: 05/31/2019 10:25 AM Estimated Blood Loss: Estimated blood loss: none.      [facility]

## 2019-05-31 NOTE — Anesthesia Post-op Follow-up Note (Signed)
Anesthesia QCDR form completed.        

## 2019-05-31 NOTE — Transfer of Care (Signed)
Immediate Anesthesia Transfer of Care Note  Patient: Lauren Escobar  Procedure(s) Performed: COLONOSCOPY WITH PROPOFOL (N/A ) ESOPHAGOGASTRODUODENOSCOPY (EGD) WITH PROPOFOL (N/A )  Patient Location: Endoscopy Unit  Anesthesia Type:General  Level of Consciousness: drowsy and patient cooperative  Airway & Oxygen Therapy: Patient Spontanous Breathing  Post-op Assessment: Report given to RN and Post -op Vital signs reviewed and stable  Post vital signs: Reviewed and stable  Last Vitals:  Vitals Value Taken Time  BP 88/63 05/31/19 1143  Temp    Pulse 74 05/31/19 1142  Resp 16 05/31/19 1143  SpO2 97 % 05/31/19 1143  Vitals shown include unvalidated device data.  Last Pain:  Vitals:   05/31/19 1019  TempSrc: Tympanic  PainSc: 0-No pain         Complications: No apparent anesthesia complications

## 2019-05-31 NOTE — Op Note (Signed)
[facility]astro[facility] Patient Name: Lauren Escobar Procedure Date: 05/31/2019 10:25 AM MRN: [id number] Account #: [id number] Date of Birth: 03-18-1966 Admit Type: Outpatient Age: 53 Room: [facility] ENDO ROOM 2 Gender: Female Note Status: Finalized Procedure:            Colonoscopy Indications:          Chronic diarrhea, Weight loss Providers:            Lin Landsman MD, MD Medicines:            Monitored Anesthesia Care Complications:        No immediate complications. Estimated blood loss: None. Procedure:            Pre-Anesthesia Assessment:                       - Prior to the procedure, a History and Physical was                        performed, and patient medications and allergies were                        reviewed. The patient is competent. The risks and                        benefits of the procedure and the sedation options and                        risks were discussed with the patient. All questions                        were answered and informed consent was obtained.                        Patient identification and proposed procedure were                        verified by the physician, the nurse, the                        anesthesiologist, the anesthetist and the technician in                        the pre-procedure area in the procedure room in the                        endoscopy suite. Mental Status Examination: alert and                        oriented. Airway Examination: normal oropharyngeal                        airway and neck mobility. Respiratory Examination:                        clear to auscultation. CV Examination: normal.                        Prophylactic Antibiotics: The patient does not require  prophylactic antibiotics. Prior Anticoagulants: The                        patient has taken no previous anticoagulant or                        antiplatelet agents. ASA Grade Assessment: III - A                         patient with severe systemic disease. After reviewing                        the risks and benefits, the patient was deemed in                        satisfactory condition to undergo the procedure. The                        anesthesia plan was to use monitored anesthesia care                        (MAC). Immediately prior to administration of                        medications, the patient was re-assessed for adequacy                        to receive sedatives. The heart rate, respiratory rate,                        oxygen saturations, blood pressure, adequacy of                        pulmonary ventilation, and response to care were                        monitored throughout the procedure. The physical status                        of the patient was re-assessed after the procedure.                       After obtaining informed consent, the colonoscope was                        passed under direct vision. Throughout the procedure,                        the patient's blood pressure, pulse, and oxygen                        saturations were monitored continuously. The                        Colonoscope was introduced through the anus and                        advanced to the the terminal ileum, with identification  of the appendiceal orifice and IC valve. The                        colonoscopy was performed without difficulty. The                        patient tolerated the procedure well. The quality of                        the bowel preparation was good. Findings:      The perianal and digital rectal examinations were normal. Pertinent       negatives include normal sphincter tone and no palpable rectal lesions.      The terminal ileum appeared normal.      The colon (entire examined portion) appeared normal. Biopsies for       histology were taken with a cold forceps from the entire colon for       evaluation of microscopic  colitis.      Normal mucosa was found in the entire colon.      Non-bleeding external hemorrhoids were found during retroflexion. The       hemorrhoids were medium-sized. Impression:           - The examined portion of the ileum was normal.                       - The entire examined colon is normal. Biopsied.                       - Normal mucosa in the entire examined colon.                       - Non-bleeding external hemorrhoids. Recommendation:       - Discharge patient to home (with escort).                       - Resume previous diet today.                       - Continue present medications.                       - Await pathology results.                       - Return to my office as previously scheduled.                       - Repeat colonoscopy in 10 years for surveillance. Procedure Code(s):    --- Professional ---                       [phone], Colonoscopy, flexible; with biopsy, single or                        multiple Diagnosis Code(s):    --- Professional ---                       K64.4, Residual hemorrhoidal skin tags                       K52.9, Noninfective gastroenteritis and colitis,  unspecified                       R63.4, Abnormal weight loss CPT copyright 2019 American Medical Association. All rights reserved. The codes documented in this report are preliminary and upon coder review may  be revised to meet current compliance requirements. Dr. Ulyess Mort Lin Landsman MD, MD 05/31/2019 11:36:11 AM This report has been signed electronically. Number of Addenda: 0 Note Initiated On: 05/31/2019 10:25 AM Scope Withdrawal Time: 0 hours 10 minutes 40 seconds  Total Procedure Duration: 0 hours 13 minutes 19 seconds  Estimated Blood Loss: Estimated blood loss: none.      [facility]

## 2019-06-01 ENCOUNTER — Encounter: Payer: Self-pay | Admitting: Gastroenterology

## 2019-06-01 LAB — PANCREATIC ELASTASE, FECAL: Pancreatic Elastase, Fecal: >500 ug Elast./g (ref >200)

## 2019-06-01 LAB — SURGICAL PATHOLOGY

## 2019-06-01 LAB — C DIFFICILE, CYTOTOXIN B

## 2019-06-01 LAB — GI PROFILE, STOOL, PCR

## 2019-06-01 LAB — C DIFFICILE TOXINS A+B W/RFLX: C difficile Toxins A+B, EIA: NEGATIVE

## 2019-06-01 LAB — CALPROTECTIN, FECAL: Calprotectin, Fecal: 36 ug/g (ref 0–120)

## 2019-06-03 ENCOUNTER — Encounter: Payer: Self-pay | Admitting: Gastroenterology

## 2019-06-12 ENCOUNTER — Other Ambulatory Visit: Payer: Self-pay | Admitting: Gastroenterology

## 2019-06-16 ENCOUNTER — Other Ambulatory Visit: Payer: Self-pay

## 2019-06-16 ENCOUNTER — Encounter: Payer: Self-pay | Admitting: Gastroenterology

## 2019-06-16 ENCOUNTER — Ambulatory Visit: Payer: Medicare Other | Admitting: Gastroenterology

## 2019-06-16 ENCOUNTER — Ambulatory Visit (INDEPENDENT_AMBULATORY_CARE_PROVIDER_SITE_OTHER): Payer: Medicare Other | Admitting: Gastroenterology

## 2019-06-16 VITALS — BP 93/60 | HR 76 | Temp 98.6°F | Resp 17 | Ht 59.0 in | Wt 130.6 lb

## 2019-06-16 DIAGNOSIS — K580 Irritable bowel syndrome with diarrhea: Secondary | ICD-10-CM

## 2019-06-16 NOTE — Progress Notes (Signed)
Cephas Darby, MD [address]  [place]  [place], [place] [address]  Main: [phone]  Fax: [phone]    Gastroenterology Consultation  Referring Provider:     Letta Median, MD Primary Care Physician:  Letta Median, MD Primary Gastroenterologist:  Dr. Cephas Darby Reason for Consultation:     Chronic diarrhea, weight loss, rectal bleeding        HPI:   Lauren Escobar is a 53 y.o. female referred by Dr. Rebeca Alert, Durene Cal, MD  for consultation & management of chronic diarrhea, unintentional weight loss and rectal bleeding.  Patient has history of anxiety, depression, polycythemia vera who undergoes phlebotomy as needed, followed by [facility], hematology.  Patient reports that she has chronic diarrhea but for the last 1 month she has been noticing rectal bleeding, bright red sometimes on wiping and otherwise mixed with stool.  She also notices weight loss.  She is limiting her p.o. intake due to ongoing diarrhea.  She reports having 5-6 runny bowel movements daily and sometimes during the night. Patient was tearful, appears sad, lives alone, lost her roommate/friend 1 year ago.  She said she never lived by herself.  She also reports abdominal cramps, diffuse, back pain.  She said she tried Lomotil and it did not help.  She is currently not taking buspirone and Topamax.  She denies smoking.  Follow-up visit 06/16/2019 Patient underwent EGD and colonoscopy with biopsies which were unremarkable.  She has been taking amitriptyline 50 mg at bedtime.  She reports that she has been constipated, having bowel movements about 3 times a week, stools are hard.  She states that she does eat only dinner and nothing throughout the day.  After dinner, she has discomfort in her stomach that she has to lay down.  Her work-up has been unremarkable so far.  NSAIDs: None  Antiplts/Anticoagulants/Anti thrombotics: None  GI Procedures:   EGD and colonoscopy 05/31/2019  - Normal  duodenal bulb and second portion of the duodenum. Biopsied. - Normal stomach. Biopsied. - LA Grade A reflux esophagitis. - Esophagogastric landmarks identified. - Normal gastroesophageal junction.  - The examined portion of the ileum was normal. - The entire examined colon is normal. Biopsied. - Normal mucosa in the entire examined colon. - Non-bleeding external hemorrhoids.  DIAGNOSIS:  A. DUODENUM; COLD BIOPSY:  - ENTERIC MUCOSA WITH PRESERVED VILLOUS ARCHITECTURE AND NO SIGNIFICANT  HISTOPATHOLOGIC CHANGE.  - NEGATIVE FOR FEATURES OF CELIAC, DYSPLASIA, AND MALIGNANCY.   B. STOMACH, RANDOM; COLD BIOPSY:  - GASTRIC ANTRAL AND OXYNTIC MUCOSA WITH NO SIGNIFICANT HISTOPATHOLOGIC  CHANGE.  - NEGATIVE FOR H. PYLORI, DYSPLASIA, AND MALIGNANCY.   C. COLON, RANDOM; COLD BIOPSY:  - INCIDENTAL MINUTE TUBULAR ADENOMA.  - OTHERWISE BENIGN COLONIC MUCOSA WITH NO SIGNIFICANT HISTOPATHOLOGIC  CHANGE.  - NEGATIVE FOR FEATURES OF MICROSCOPIC COLITIS.  - NEGATIVE FOR HIGH-GRADE DYSPLASIA AND MALIGNANCY.   Colonoscopy 12/2009  Diagnosis:  Part A: ASCENDING COLON COLD BIOPSIES:  - NO COLITIS.  Marland Kitchen  Part B: TRANSVERSE COLON COLD BIOPSIES:  - NO COLITIS.  Marland Kitchen  Part C: SIGMOID COLON COLD BIOPSIES:  - NO COLITIS.   Colonoscopy 12/2011 Diagnosis:  DESCENDING COLON MUCOSAL COLD BIOPSY:  - COLONIC MUCOSA WITH NO PATHOLOGIC ABNORMALITY.  - NEGATIVE FOR DYSPLASIA AND MALIGNANCY.   colonoscopy by Dr. Vira Agar in 06/2015 - One diminutive polyp in the sigmoid colon. Resected and retrieved. - Internal hemorrhoids. - The examination was otherwise normal.  DIAGNOSIS:  COLON POLYP, SIGMOID; COLD  BIOPSY:  - TUBULAR ADENOMA.  - NEGATIVE FOR HIGH-GRADE DYSPLASIA AND MALIGNANCY.   EGD 06/18/2017 - Normal first portion of the duodenum, second portion of the duodenum and third portion of the duodenum. - Normal stomach. - Normal gastroesophageal junction and esophagus. - No specimens collected.  Past  Medical History:  Diagnosis Date  . Anxiety   . Asthma   . Bleeding disorder ([place])   . Blood dyscrasia    polycythemia vera  . Chronic kidney disease   . Depression   . Diabetes mellitus without complication ([place])   . Headache 2000  . Horizontal nystagmus    age 3  . Hypertension   . Von Willebrand's disease [facility])     Past Surgical History:  Procedure Laterality Date  . ABDOMINAL HYSTERECTOMY  2002   oophorectomy  . ABDOMINAL HYSTERECTOMY    . APPENDECTOMY    . BLADDER SURGERY    . BREAST BIOPSY Left    NEG  . CARDIAC CATHETERIZATION    . CARDIAC CATHETERIZATION N/A 07/17/2016   Procedure: Left Heart Cath and Coronary Angiography;  Surgeon: Corey Skains, MD;  Location: [place] CV LAB;  Service: Cardiovascular;  Laterality: N/A;  . COLONOSCOPY WITH PROPOFOL N/A 07/27/2015   Procedure: COLONOSCOPY WITH PROPOFOL;  Surgeon: Manya Silvas, MD;  Location: [facility] ENDOSCOPY;  Service: Endoscopy;  Laterality: N/A;  . COLONOSCOPY WITH PROPOFOL N/A 05/31/2019   Procedure: COLONOSCOPY WITH PROPOFOL;  Surgeon: Lin Landsman, MD;  Location: [facility] ENDOSCOPY;  Service: Gastroenterology;  Laterality: N/A;  . ESOPHAGOGASTRODUODENOSCOPY N/A 06/18/2017   Procedure: ESOPHAGOGASTRODUODENOSCOPY (EGD);  Surgeon: Lin Landsman, MD;  Location: [facility] ENDOSCOPY;  Service: Gastroenterology;  Laterality: N/A;  . ESOPHAGOGASTRODUODENOSCOPY (EGD) WITH PROPOFOL N/A 05/31/2019   Procedure: ESOPHAGOGASTRODUODENOSCOPY (EGD) WITH PROPOFOL;  Surgeon: Lin Landsman, MD;  Location: [facility] ENDOSCOPY;  Service: Gastroenterology;  Laterality: N/A;  . REPLACEMENT TOTAL KNEE Left   . SHOULDER SURGERY Left    x 2   Current Outpatient Medications:  .  albuterol (VENTOLIN HFA) 108 (90 Base) MCG/ACT inhaler, Inhale into the lungs., Disp: , Rfl:  .  amitriptyline (ELAVIL) 50 MG tablet, TAKE 1 TABLET BY MOUTH EVERYDAY AT BEDTIME, Disp: 90 tablet, Rfl: 1 .  aspirin EC 81 MG tablet, Take by mouth., Disp: ,  Rfl:  .  atorvastatin (LIPITOR) 20 MG tablet, TAKE 1 TABLET BY MOUTH AT BEDTIME FOR HIGH CHOLESTEROL, Disp: , Rfl:  .  buPROPion (WELLBUTRIN XL) 300 MG 24 hr tablet, Take by mouth., Disp: , Rfl:  .  cetirizine (ZYRTEC) 10 MG tablet, TAKE 1 TABLET BY MOUTH DAILY FOR ALLERGIES, Disp: , Rfl:  .  ciprofloxacin (CIPRO) 500 MG tablet, TAKE 1 TABLET BY MOUTH TWICE A DAY FOR 7 DAYS, Disp: , Rfl:  .  dicyclomine (BENTYL) 10 MG capsule, Take 1 capsule (10 mg total) by mouth 4 (four) times daily -  before meals and at bedtime for 60 doses., Disp: 60 capsule, Rfl: 0 .  diphenoxylate-atropine (LOMOTIL) 2.5-0.025 MG per tablet, Take 2 tablets by mouth 4 (four) times daily as needed. , Disp: , Rfl:  .  esomeprazole (NEXIUM) 40 MG capsule, Take 40 mg by mouth daily. , Disp: , Rfl:  .  Fluticasone-Salmeterol (ADVAIR DISKUS) 250-50 MCG/DOSE AEPB, Inhale 1 puff into the lungs 2 (two) times daily. , Disp: , Rfl:  .  folic acid (FOLVITE) 1 MG tablet, Take 1 mg by mouth daily., Disp: , Rfl:  .  hydrOXYzine (ATARAX/VISTARIL) 25 MG tablet, TAKE  1 4 ORAL TABLETS UP TO TWICE DAILY AS NEEDED FOR SEVERE ANXIETY OR SLEEP, Disp: , Rfl:  .  ipratropium-albuterol (DUONEB) 0.5-2.5 (3) MG/3ML SOLN, 3 mL Q4H (RT). , Disp: , Rfl:  .  isosorbide mononitrate (IMDUR) 30 MG 24 hr tablet, Take 30 mg by mouth daily., Disp: , Rfl:  .  NITROGLYCERIN SL, Place 1 tablet under the tongue., Disp: , Rfl:  .  propranolol (INDERAL) 40 MG tablet, TAKE 1 TABLET TWICE DAILY TO PREVENT HEADACHES, Disp: , Rfl:  .  SUMAtriptan (IMITREX) 50 MG tablet, Take by mouth., Disp: , Rfl:    Family History  Problem Relation Age of Onset  . Cancer Father        prostate  . Stroke Father   . Diabetes Father   . Cancer Mother        bladder  . Breast cancer Maternal Grandmother      Social History   Tobacco Use  . Smoking status: Former Smoker    Quit date: 06/18/2014    Years since quitting: 4.9  . Smokeless tobacco: Never Used  Substance Use Topics   . Alcohol use: No    Alcohol/week: 0.0 standard drinks  . Drug use: No    Allergies as of 06/16/2019 - Review Complete 06/16/2019  Allergen Reaction Noted  . Aspirin  06/16/2017  . Pregabalin Other (See Comments) 04/21/2015  . Tramadol Rash 12/12/2015    Review of Systems:    All systems reviewed and negative except where noted in HPI.   Physical Exam:  BP 93/60 (BP Location: Left Arm, Patient Position: Sitting, Cuff Size: Normal)   Pulse 76   Temp 98.6 F (37 C)   Resp 17   Ht 4\' 11"  (1.499 m)   Wt 130 lb 9.6 oz (59.2 kg)   BMI 26.38 kg/m  No LMP recorded. Patient has had a hysterectomy.  General:   Alert,  Well-developed, well-nourished, pleasant and cooperative in NAD Head:  Normocephalic and atraumatic. Eyes:  Sclera clear, no icterus.   Conjunctiva pink. Ears:  Normal auditory acuity. Nose:  No deformity, discharge, or lesions. Mouth:  No deformity or lesions,oropharynx pink & moist. Neck:  Supple; no masses or thyromegaly. Lungs:  Respirations even and unlabored.  Clear throughout to auscultation.   No wheezes, crackles, or rhonchi. No acute distress. Heart:  Regular rate and rhythm; no murmurs, clicks, rubs, or gallops. Abdomen:  Normal bowel sounds. Soft, non-tender and non-distended without masses, hepatosplenomegaly or hernias noted.  No guarding or rebound tenderness.   Rectal: Not performed Msk:  Symmetrical without gross deformities. Good, equal movement & strength bilaterally. Pulses:  Normal pulses noted. Extremities:  No clubbing or edema.  No cyanosis. Neurologic:  Alert and oriented x3;  grossly normal neurologically. Skin:  Intact without significant lesions or rashes. No jaundice. Psych:  Alert and cooperative. Normal mood and affect.  Imaging Studies: Reviewed  Assessment and Plan:   Lauren Escobar is a 53 y.o. female with history of untreated anxiety, depression, lives alone with chronic diarrhea in 1 month history of worsening abdominal  cramps, diarrhea, rectal bleeding, weight loss.  Work-up including infectious etiology, fecal calprotectin levels, pancreatic elastase, CRP, celiac serologies, EGD and colonoscopy with biopsies are unremarkable.  Her weight is stable.  Currently on amitriptyline 50 mg at bedtime, has constipation  Decrease amitriptyline to 25 mg at bedtime   Follow up in 3 to 4 months  Cephas Darby, MD

## 2019-07-09 ENCOUNTER — Encounter: Admit: 2019-07-09 | Discharge: 2019-07-09 | Payer: MEDICARE

## 2019-07-09 DIAGNOSIS — M542 Cervicalgia: Secondary | ICD-10-CM

## 2019-07-09 DIAGNOSIS — M47812 Spondylosis without myelopathy or radiculopathy, cervical region: Secondary | ICD-10-CM

## 2019-07-11 DIAGNOSIS — M542 Cervicalgia: Secondary | ICD-10-CM

## 2019-08-30 ENCOUNTER — Encounter
Admit: 2019-08-30 | Discharge: 2019-08-31 | Payer: MEDICARE | Attending: Physical Medicine & Rehabilitation | Primary: Physical Medicine & Rehabilitation

## 2019-08-30 DIAGNOSIS — M542 Cervicalgia: Principal | ICD-10-CM

## 2019-08-30 DIAGNOSIS — M479 Spondylosis, unspecified: Principal | ICD-10-CM

## 2019-08-30 MED ORDER — CYCLOBENZAPRINE 5 MG TABLET
ORAL_TABLET | Freq: Every day | ORAL | 0 refills | 0.00000 days | Status: CP
Start: 2019-08-30 — End: ?

## 2019-08-30 MED ORDER — CYCLOBENZAPRINE 5 MG TABLET: 5 mg | tablet | Freq: Three times a day (TID) | 0 refills | 10 days | Status: AC

## 2019-09-01 ENCOUNTER — Encounter: Admit: 2019-09-01 | Discharge: 2019-09-01 | Payer: MEDICARE

## 2019-09-01 ENCOUNTER — Ambulatory Visit
Admit: 2019-09-01 | Discharge: 2019-09-01 | Payer: MEDICARE | Attending: Hematology & Oncology | Primary: Hematology & Oncology

## 2019-09-01 DIAGNOSIS — R310 Gross hematuria: Principal | ICD-10-CM

## 2019-09-01 DIAGNOSIS — D45 Polycythemia vera: Principal | ICD-10-CM

## 2019-09-01 DIAGNOSIS — E109 Type 1 diabetes mellitus without complications: Principal | ICD-10-CM

## 2019-09-17 ENCOUNTER — Ambulatory Visit: Payer: Medicare Other | Admitting: Gastroenterology

## 2019-09-20 ENCOUNTER — Encounter
Admit: 2019-09-20 | Discharge: 2019-10-19 | Payer: MEDICARE | Attending: Rehabilitative and Restorative Service Providers" | Primary: Rehabilitative and Restorative Service Providers"

## 2019-09-28 ENCOUNTER — Encounter: Admit: 2019-09-28 | Discharge: 2019-09-28 | Payer: MEDICARE

## 2019-09-28 ENCOUNTER — Ambulatory Visit
Admit: 2019-09-28 | Discharge: 2019-09-28 | Payer: MEDICARE | Attending: Hematology & Oncology | Primary: Hematology & Oncology

## 2019-09-28 ENCOUNTER — Non-Acute Institutional Stay: Admit: 2019-09-28 | Discharge: 2019-09-28 | Payer: MEDICARE

## 2019-09-28 DIAGNOSIS — R310 Gross hematuria: Principal | ICD-10-CM

## 2019-09-28 DIAGNOSIS — R232 Flushing: Principal | ICD-10-CM

## 2019-09-28 DIAGNOSIS — D414 Neoplasm of uncertain behavior of bladder: Principal | ICD-10-CM

## 2019-09-28 DIAGNOSIS — N3010 Interstitial cystitis (chronic) without hematuria: Principal | ICD-10-CM

## 2019-09-28 DIAGNOSIS — R911 Solitary pulmonary nodule: Principal | ICD-10-CM

## 2019-09-28 DIAGNOSIS — D45 Polycythemia vera: Principal | ICD-10-CM

## 2019-09-28 MED ORDER — CONJUGATED ESTROGENS 0.625 MG TABLET
ORAL_TABLET | Freq: Every day | ORAL | 0 refills | 30.00000 days | Status: CP
Start: 2019-09-28 — End: 2020-09-27

## 2019-09-28 MED ORDER — HYDROXYUREA 500 MG CAPSULE
ORAL_CAPSULE | Freq: Every day | ORAL | 11 refills | 30 days | Status: CP
Start: 2019-09-28 — End: 2020-09-27

## 2019-09-29 ENCOUNTER — Ambulatory Visit (INDEPENDENT_AMBULATORY_CARE_PROVIDER_SITE_OTHER): Payer: Medicare Other | Admitting: Gastroenterology

## 2019-09-29 ENCOUNTER — Encounter: Payer: Self-pay | Admitting: Gastroenterology

## 2019-09-29 ENCOUNTER — Encounter (INDEPENDENT_AMBULATORY_CARE_PROVIDER_SITE_OTHER): Payer: Self-pay

## 2019-09-29 ENCOUNTER — Other Ambulatory Visit: Payer: Self-pay

## 2019-09-29 VITALS — BP 106/67 | HR 80 | Temp 97.6°F | Wt 129.4 lb

## 2019-09-29 DIAGNOSIS — K580 Irritable bowel syndrome with diarrhea: Secondary | ICD-10-CM

## 2019-09-29 DIAGNOSIS — R1030 Lower abdominal pain, unspecified: Secondary | ICD-10-CM | POA: Diagnosis not present

## 2019-09-29 MED ORDER — DICYCLOMINE HCL 20 MG PO TABS: 20.0000 mg | ORAL_TABLET | Freq: Three times a day (TID) | ORAL | 0 refills | Status: DC

## 2019-09-29 MED ORDER — OXYBUTYNIN CHLORIDE ER 5 MG PO TB24: 5.0000 mg | ORAL_TABLET | Freq: Every day | ORAL | 0 refills | Status: DC

## 2019-09-29 NOTE — Progress Notes (Signed)
Cephas Darby, MD [address]  [place]  [place], [place] [address]  Main: [phone]  Fax: [phone]    Gastroenterology Consultation  Referring Provider:     Letta Median, MD Primary Care Physician:  Letta Median, MD Primary Gastroenterologist:  Dr. Cephas Darby Reason for Consultation:     Chronic diarrhea, weight loss, rectal bleeding        HPI:   Lauren Escobar is a 53 y.o. female referred by Dr. Rebeca Alert, Durene Cal, MD  for consultation & management of chronic diarrhea, unintentional weight loss and rectal bleeding.  Patient has history of anxiety, depression, polycythemia vera who undergoes phlebotomy as needed, followed by [facility], hematology.  Patient reports that she has chronic diarrhea but for the last 1 month she has been noticing rectal bleeding, bright red sometimes on wiping and otherwise mixed with stool.  She also notices weight loss.  She is limiting her p.o. intake due to ongoing diarrhea.  She reports having 5-6 runny bowel movements daily and sometimes during the night. Patient was tearful, appears sad, lives alone, lost her roommate/friend 1 year ago.  She said she never lived by herself.  She also reports abdominal cramps, diffuse, back pain.  She said she tried Lomotil and it did not help.  She is currently not taking buspirone and Topamax.  She denies smoking.  Follow-up visit 06/16/2019 Patient underwent EGD and colonoscopy with biopsies which were unremarkable.  She has been taking amitriptyline 50 mg at bedtime.  She reports that she has been constipated, having bowel movements about 3 times a week, stools are hard.  She states that she does eat only dinner and nothing throughout the day.  After dinner, she has discomfort in her stomach that she has to lay down.  Her work-up has been unremarkable so far.  Follow-up visit 09/29/2019 Patient reports that for last 2 weeks, she has been experiencing severe lower abdominal pain, in the  suprapubic area, worse with bending over.  She denies fever, chills.  She denies burning urination, increased urinary frequency, incontinence or hesitancy.  She denies back pain.  Associated with nausea.  She denies bloating.  She reports having regular bowel movements.  She reports Bentyl partially alleviated her symptoms and she ran out of it now.  She also has history of interstitial cystitis and was seeing an urologist in the past.  Her weight has been stable.  She denies diarrhea, constipation or rectal bleeding.  NSAIDs: None  Antiplts/Anticoagulants/Anti thrombotics: None  GI Procedures:   EGD and colonoscopy 05/31/2019  - Normal duodenal bulb and second portion of the duodenum. Biopsied. - Normal stomach. Biopsied. - LA Grade A reflux esophagitis. - Esophagogastric landmarks identified. - Normal gastroesophageal junction.  - The examined portion of the ileum was normal. - The entire examined colon is normal. Biopsied. - Normal mucosa in the entire examined colon. - Non-bleeding external hemorrhoids.  DIAGNOSIS:  A. DUODENUM; COLD BIOPSY:  - ENTERIC MUCOSA WITH PRESERVED VILLOUS ARCHITECTURE AND NO SIGNIFICANT  HISTOPATHOLOGIC CHANGE.  - NEGATIVE FOR FEATURES OF CELIAC, DYSPLASIA, AND MALIGNANCY.   B. STOMACH, RANDOM; COLD BIOPSY:  - GASTRIC ANTRAL AND OXYNTIC MUCOSA WITH NO SIGNIFICANT HISTOPATHOLOGIC  CHANGE.  - NEGATIVE FOR H. PYLORI, DYSPLASIA, AND MALIGNANCY.   C. COLON, RANDOM; COLD BIOPSY:  - INCIDENTAL MINUTE TUBULAR ADENOMA.  - OTHERWISE BENIGN COLONIC MUCOSA WITH NO SIGNIFICANT HISTOPATHOLOGIC  CHANGE.  - NEGATIVE FOR FEATURES OF MICROSCOPIC COLITIS.  - NEGATIVE FOR  HIGH-GRADE DYSPLASIA AND MALIGNANCY.   Colonoscopy 12/2009  Diagnosis:  Part A: ASCENDING COLON COLD BIOPSIES:  - NO COLITIS.  Marland Kitchen  Part B: TRANSVERSE COLON COLD BIOPSIES:  - NO COLITIS.  Marland Kitchen  Part C: SIGMOID COLON COLD BIOPSIES:  - NO COLITIS.   Colonoscopy 12/2011 Diagnosis:  DESCENDING  COLON MUCOSAL COLD BIOPSY:  - COLONIC MUCOSA WITH NO PATHOLOGIC ABNORMALITY.  - NEGATIVE FOR DYSPLASIA AND MALIGNANCY.   colonoscopy by Dr. Vira Agar in 06/2015 - One diminutive polyp in the sigmoid colon. Resected and retrieved. - Internal hemorrhoids. - The examination was otherwise normal.  DIAGNOSIS:  COLON POLYP, SIGMOID; COLD BIOPSY:  - TUBULAR ADENOMA.  - NEGATIVE FOR HIGH-GRADE DYSPLASIA AND MALIGNANCY.   EGD 06/18/2017 - Normal first portion of the duodenum, second portion of the duodenum and third portion of the duodenum. - Normal stomach. - Normal gastroesophageal junction and esophagus. - No specimens collected.  Past Medical History:  Diagnosis Date  . Anxiety   . Asthma   . Bleeding disorder ([place])   . Blood dyscrasia    polycythemia vera  . Chronic kidney disease   . Depression   . Diabetes mellitus without complication ([place])   . Headache 2000  . Horizontal nystagmus    age 54  . Hypertension   . Von Willebrand's disease [facility])     Past Surgical History:  Procedure Laterality Date  . ABDOMINAL HYSTERECTOMY  2002   oophorectomy  . ABDOMINAL HYSTERECTOMY    . APPENDECTOMY    . BLADDER SURGERY    . BREAST BIOPSY Left    NEG  . CARDIAC CATHETERIZATION    . CARDIAC CATHETERIZATION N/A 07/17/2016   Procedure: Left Heart Cath and Coronary Angiography;  Surgeon: Corey Skains, MD;  Location: [place] CV LAB;  Service: Cardiovascular;  Laterality: N/A;  . COLONOSCOPY WITH PROPOFOL N/A 07/27/2015   Procedure: COLONOSCOPY WITH PROPOFOL;  Surgeon: Manya Silvas, MD;  Location: [facility] ENDOSCOPY;  Service: Endoscopy;  Laterality: N/A;  . COLONOSCOPY WITH PROPOFOL N/A 05/31/2019   Procedure: COLONOSCOPY WITH PROPOFOL;  Surgeon: Lin Landsman, MD;  Location: [facility] ENDOSCOPY;  Service: Gastroenterology;  Laterality: N/A;  . ESOPHAGOGASTRODUODENOSCOPY N/A 06/18/2017   Procedure: ESOPHAGOGASTRODUODENOSCOPY (EGD);  Surgeon: Lin Landsman, MD;  Location: [facility]  ENDOSCOPY;  Service: Gastroenterology;  Laterality: N/A;  . ESOPHAGOGASTRODUODENOSCOPY (EGD) WITH PROPOFOL N/A 05/31/2019   Procedure: ESOPHAGOGASTRODUODENOSCOPY (EGD) WITH PROPOFOL;  Surgeon: Lin Landsman, MD;  Location: [facility] ENDOSCOPY;  Service: Gastroenterology;  Laterality: N/A;  . REPLACEMENT TOTAL KNEE Left   . SHOULDER SURGERY Left    x 2   Current Outpatient Medications:  .  albuterol (VENTOLIN HFA) 108 (90 Base) MCG/ACT inhaler, Inhale into the lungs., Disp: , Rfl:  .  amitriptyline (ELAVIL) 50 MG tablet, TAKE 1 TABLET BY MOUTH EVERYDAY AT BEDTIME, Disp: 90 tablet, Rfl: 1 .  aspirin EC 81 MG tablet, Take by mouth., Disp: , Rfl:  .  atorvastatin (LIPITOR) 20 MG tablet, TAKE 1 TABLET BY MOUTH AT BEDTIME FOR HIGH CHOLESTEROL, Disp: , Rfl:  .  buPROPion (WELLBUTRIN XL) 300 MG 24 hr tablet, Take by mouth., Disp: , Rfl:  .  cetirizine (ZYRTEC) 10 MG tablet, TAKE 1 TABLET BY MOUTH DAILY FOR ALLERGIES, Disp: , Rfl:  .  esomeprazole (NEXIUM) 40 MG capsule, Take 40 mg by mouth daily. , Disp: , Rfl:  .  Fluticasone-Salmeterol (ADVAIR DISKUS) 250-50 MCG/DOSE AEPB, Inhale 1 puff into the lungs 2 (two) times daily. , Disp: ,  Rfl:  .  folic acid (FOLVITE) 1 MG tablet, Take 1 mg by mouth daily., Disp: , Rfl:  .  hydrOXYzine (ATARAX/VISTARIL) 25 MG tablet, TAKE 1 4 ORAL TABLETS UP TO TWICE DAILY AS NEEDED FOR SEVERE ANXIETY OR SLEEP, Disp: , Rfl:  .  ipratropium-albuterol (DUONEB) 0.5-2.5 (3) MG/3ML SOLN, 3 mL Q4H (RT). , Disp: , Rfl:  .  isosorbide mononitrate (IMDUR) 30 MG 24 hr tablet, Take 30 mg by mouth daily., Disp: , Rfl:  .  NITROGLYCERIN SL, Place 1 tablet under the tongue., Disp: , Rfl:  .  propranolol (INDERAL) 40 MG tablet, TAKE 1 TABLET TWICE DAILY TO PREVENT HEADACHES, Disp: , Rfl:  .  SUMAtriptan (IMITREX) 50 MG tablet, Take by mouth., Disp: , Rfl:  .  dicyclomine (BENTYL) 20 MG tablet, Take 1 tablet (20 mg total) by mouth 4 (four) times daily -  before meals and at bedtime., Disp:  120 tablet, Rfl: 0 .  oxybutynin (DITROPAN-XL) 5 MG 24 hr tablet, Take 1 tablet (5 mg total) by mouth at bedtime., Disp: 30 tablet, Rfl: 0   Family History  Problem Relation Age of Onset  . Cancer Father        prostate  . Stroke Father   . Diabetes Father   . Cancer Mother        bladder  . Breast cancer Maternal Grandmother      Social History   Tobacco Use  . Smoking status: Former Smoker    Quit date: 06/18/2014    Years since quitting: 5.2  . Smokeless tobacco: Never Used  Substance Use Topics  . Alcohol use: No    Alcohol/week: 0.0 standard drinks  . Drug use: No    Allergies as of 09/29/2019 - Review Complete 09/29/2019  Allergen Reaction Noted  . Aspirin  06/16/2017  . Pregabalin Other (See Comments) 04/21/2015  . Tramadol Rash 12/12/2015    Review of Systems:    All systems reviewed and negative except where noted in HPI.   Physical Exam:  BP 106/67 (BP Location: Left Arm, Patient Position: Sitting, Cuff Size: Normal)   Pulse 80   Temp 97.6 F (36.4 C) (Oral)   Wt 129 lb 6 oz (58.7 kg)   BMI 26.13 kg/m  No LMP recorded. Patient has had a hysterectomy.  General:   Alert,  Well-developed, well-nourished, pleasant and cooperative in NAD Head:  Normocephalic and atraumatic. Eyes:  Sclera clear, no icterus.   Conjunctiva pink. Ears:  Normal auditory acuity. Nose:  No deformity, discharge, or lesions. Mouth:  No deformity or lesions,oropharynx pink & moist. Neck:  Supple; no masses or thyromegaly. Lungs:  Respirations even and unlabored.  Clear throughout to auscultation.   No wheezes, crackles, or rhonchi. No acute distress. Heart:  Regular rate and rhythm; no murmurs, clicks, rubs, or gallops. Abdomen:  Normal bowel sounds. Soft, diffuse abdominal tenderness, worse in the suprapubic area and non-distended without masses, hepatosplenomegaly or hernias noted.  No guarding or rebound tenderness.   Rectal: Not performed Msk:  Symmetrical without gross  deformities. Good, equal movement & strength bilaterally. Pulses:  Normal pulses noted. Extremities:  No clubbing or edema.  No cyanosis. Neurologic:  Alert and oriented x3;  grossly normal neurologically. Skin:  Intact without significant lesions or rashes. No jaundice. Psych:  Alert and cooperative. Normal mood and affect.  Imaging Studies: Reviewed  Assessment and Plan:   Lauren Escobar is a 52 y.o. female with history of untreated anxiety, depression, lives  alone with chronic diarrhea in 1 month history of worsening abdominal cramps, diarrhea, rectal bleeding, weight loss.  Work-up including infectious etiology, fecal calprotectin levels, pancreatic elastase, CRP, celiac serologies, EGD and colonoscopy with biopsies are unremarkable.  Her weight is stable.  Currently on amitriptyline and Bentyl as needed.  Reports 2 weeks of lower abdominal pain but has diffuse abdominal tenderness on exam with no other constitutional symptoms  Increase Bentyl to 20 mg 3 times a day Trial of oxybutynin 5 mg extended release at bedtime  Follow up in 3 months  Cephas Darby, MD

## 2019-10-07 ENCOUNTER — Encounter
Admit: 2019-10-07 | Discharge: 2019-10-08 | Payer: MEDICARE | Attending: Physical Medicine & Rehabilitation | Primary: Physical Medicine & Rehabilitation

## 2019-10-07 ENCOUNTER — Ambulatory Visit: Admit: 2019-10-07 | Discharge: 2019-10-08 | Payer: MEDICARE

## 2019-10-07 DIAGNOSIS — M47812 Spondylosis without myelopathy or radiculopathy, cervical region: Principal | ICD-10-CM

## 2019-10-20 DIAGNOSIS — R232 Flushing: Principal | ICD-10-CM

## 2019-10-20 MED ORDER — BREXPIPRAZOLE 0.5 MG TABLET
Freq: Every day | 0 days
Start: 2019-10-20 — End: ?

## 2019-10-21 ENCOUNTER — Other Ambulatory Visit: Payer: Self-pay | Admitting: Gastroenterology

## 2019-10-21 DIAGNOSIS — R1030 Lower abdominal pain, unspecified: Secondary | ICD-10-CM

## 2019-10-21 DIAGNOSIS — K580 Irritable bowel syndrome with diarrhea: Secondary | ICD-10-CM

## 2019-10-25 MED ORDER — PREMARIN 0.625 MG TABLET
ORAL_TABLET | 0 refills | 0 days | Status: CP
Start: 2019-10-25 — End: ?

## 2019-11-02 ENCOUNTER — Ambulatory Visit: Admit: 2019-11-02 | Discharge: 2019-11-02 | Payer: MEDICARE

## 2019-11-02 ENCOUNTER — Encounter: Admit: 2019-11-02 | Discharge: 2019-11-02 | Payer: MEDICARE

## 2019-11-02 ENCOUNTER — Encounter
Admit: 2019-11-02 | Discharge: 2019-11-02 | Payer: MEDICARE | Attending: Hematology & Oncology | Primary: Hematology & Oncology

## 2019-11-02 DIAGNOSIS — F17210 Nicotine dependence, cigarettes, uncomplicated: Principal | ICD-10-CM

## 2019-11-02 DIAGNOSIS — E010 Iodine-deficiency related diffuse (endemic) goiter: Principal | ICD-10-CM

## 2019-11-02 MED ORDER — VARENICLINE 0.5 MG (11)-1 MG (42) TABLETS IN A DOSE PACK
PACK | 0 refills | 0 days | Status: CP
Start: 2019-11-02 — End: 2020-01-31

## 2019-11-30 ENCOUNTER — Ambulatory Visit: Admit: 2019-11-30 | Discharge: 2019-12-01 | Payer: MEDICARE

## 2019-11-30 MED ORDER — VARENICLINE 1 MG TABLET
ORAL_TABLET | Freq: Two times a day (BID) | ORAL | 2 refills | 30 days | Status: CP
Start: 2019-11-30 — End: 2020-02-28

## 2019-12-02 MED ORDER — HYDROXYUREA 500 MG CAPSULE
ORAL_CAPSULE | Freq: Every day | ORAL | 11 refills | 30 days | Status: CP
Start: 2019-12-02 — End: 2020-12-01

## 2019-12-05 DIAGNOSIS — R232 Flushing: Principal | ICD-10-CM

## 2019-12-06 ENCOUNTER — Encounter
Admit: 2019-12-06 | Discharge: 2019-12-07 | Payer: MEDICARE | Attending: Hematology & Oncology | Primary: Hematology & Oncology

## 2019-12-06 MED ORDER — DICLOFENAC 1 % TOPICAL GEL
Freq: Four times a day (QID) | TOPICAL | 3 refills | 13.00000 days | Status: CP
Start: 2019-12-06 — End: 2020-12-05

## 2019-12-06 MED ORDER — CELECOXIB 200 MG CAPSULE
ORAL_CAPSULE | Freq: Every day | ORAL | 2 refills | 0.00000 days | Status: CP
Start: 2019-12-06 — End: 2020-12-14

## 2019-12-06 MED ORDER — CELECOXIB 200 MG CAPSULE: 200 mg | capsule | Freq: Two times a day (BID) | 2 refills | 30 days | Status: AC

## 2019-12-06 MED ORDER — NICOTINE 21 MG/24 HR DAILY TRANSDERMAL PATCH
MEDICATED_PATCH | TRANSDERMAL | 0 refills | 28 days | Status: CP
Start: 2019-12-06 — End: ?

## 2019-12-07 MED ORDER — PREMARIN 0.625 MG TABLET
ORAL_TABLET | 0 refills | 0 days | Status: CP
Start: 2019-12-07 — End: ?

## 2019-12-08 ENCOUNTER — Other Ambulatory Visit: Payer: Self-pay | Admitting: Gastroenterology

## 2019-12-08 ENCOUNTER — Encounter: Admit: 2019-12-08 | Discharge: 2019-12-09 | Payer: MEDICARE

## 2019-12-08 DIAGNOSIS — R300 Dysuria: Principal | ICD-10-CM

## 2019-12-08 DIAGNOSIS — N320 Bladder-neck obstruction: Principal | ICD-10-CM

## 2019-12-10 ENCOUNTER — Encounter: Admit: 2019-12-10 | Discharge: 2019-12-11 | Payer: MEDICARE

## 2019-12-10 DIAGNOSIS — R3915 Urgency of urination: Principal | ICD-10-CM

## 2019-12-10 MED ORDER — MYRBETRIQ 50 MG TABLET,EXTENDED RELEASE
ORAL_TABLET | Freq: Every day | ORAL | 0 refills | 30 days | Status: CP
Start: 2019-12-10 — End: 2020-01-09

## 2019-12-27 ENCOUNTER — Other Ambulatory Visit: Payer: Self-pay

## 2019-12-27 ENCOUNTER — Ambulatory Visit: Admit: 2019-12-27 | Discharge: 2019-12-28 | Payer: MEDICARE

## 2019-12-27 ENCOUNTER — Encounter
Admit: 2019-12-27 | Discharge: 2019-12-28 | Payer: MEDICARE | Attending: Hematology & Oncology | Primary: Hematology & Oncology

## 2019-12-27 ENCOUNTER — Encounter: Admit: 2019-12-27 | Discharge: 2019-12-28 | Payer: MEDICARE

## 2019-12-29 ENCOUNTER — Encounter: Payer: Self-pay | Admitting: *Deleted

## 2019-12-29 ENCOUNTER — Ambulatory Visit: Payer: Medicare Other | Admitting: Gastroenterology

## 2019-12-29 DIAGNOSIS — K580 Irritable bowel syndrome with diarrhea: Secondary | ICD-10-CM

## 2019-12-30 DIAGNOSIS — R232 Flushing: Principal | ICD-10-CM

## 2019-12-31 MED ORDER — PREMARIN 0.625 MG TABLET
ORAL_TABLET | 0 refills | 0 days | Status: CP
Start: 2019-12-31 — End: ?

## 2020-01-02 DIAGNOSIS — R3915 Urgency of urination: Principal | ICD-10-CM

## 2020-01-03 ENCOUNTER — Other Ambulatory Visit: Payer: Self-pay

## 2020-01-03 DIAGNOSIS — K580 Irritable bowel syndrome with diarrhea: Secondary | ICD-10-CM

## 2020-01-03 DIAGNOSIS — R1030 Lower abdominal pain, unspecified: Secondary | ICD-10-CM

## 2020-01-03 MED ORDER — NICOTINE 14 MG/24 HR DAILY TRANSDERMAL PATCH
MEDICATED_PATCH | TRANSDERMAL | 0 refills | 28.00000 days | Status: CP
Start: 2020-01-03 — End: ?

## 2020-01-03 MED ORDER — DICYCLOMINE HCL 20 MG PO TABS: 20.0000 mg | ORAL_TABLET | Freq: Three times a day (TID) | ORAL | 0 refills | Status: DC

## 2020-01-03 MED ORDER — OXYBUTYNIN CHLORIDE ER 5 MG PO TB24: ORAL_TABLET | ORAL | 0 refills | Status: DC

## 2020-01-03 NOTE — Telephone Encounter (Signed)
Last office vi/sit 09/29/2019 IBS  Last refill 10/25/2019 0 refills Bentyl  Oxybutynin 10/25/2019 0 refills

## 2020-01-04 MED ORDER — MYRBETRIQ 50 MG TABLET,EXTENDED RELEASE
ORAL_TABLET | 10 refills | 0 days | Status: CP
Start: 2020-01-04 — End: ?

## 2020-01-05 DIAGNOSIS — D45 Polycythemia vera: Principal | ICD-10-CM

## 2020-01-27 ENCOUNTER — Other Ambulatory Visit: Payer: Self-pay | Admitting: Gastroenterology

## 2020-01-27 DIAGNOSIS — K580 Irritable bowel syndrome with diarrhea: Secondary | ICD-10-CM

## 2020-01-27 DIAGNOSIS — R1030 Lower abdominal pain, unspecified: Secondary | ICD-10-CM

## 2020-01-28 NOTE — Telephone Encounter (Signed)
Please see and approve refills.

## 2020-01-31 MED ORDER — NICOTINE 7 MG/24 HR DAILY TRANSDERMAL PATCH
MEDICATED_PATCH | TRANSDERMAL | 0 refills | 28 days | Status: CP
Start: 2020-01-31 — End: ?

## 2020-02-03 ENCOUNTER — Other Ambulatory Visit: Payer: Self-pay | Admitting: Family Medicine

## 2020-02-12 ENCOUNTER — Ambulatory Visit: Payer: Medicare Other | Attending: Internal Medicine

## 2020-02-12 DIAGNOSIS — Z23 Encounter for immunization: Secondary | ICD-10-CM

## 2020-02-12 NOTE — Progress Notes (Signed)
   Covid-19 Vaccination Clinic  Name:  Lauren Escobar    MRN: [id number] DOB: 1965-11-28  02/12/2020  [name] was observed post Covid-19 immunization for 15 minutes without incident. She was provided with Vaccine Information Sheet and instruction to access the V-Safe system.   [name] was instructed to call 911 with any severe reactions post vaccine: Marland Kitchen Difficulty breathing  . Swelling of face and throat  . A fast heartbeat  . A bad rash all over body  . Dizziness and weakness   Immunizations Administered    Name Date Dose VIS Date Route   Pfizer COVID-19 Vaccine 02/12/2020 10:00 AM 0.3 mL 10/08/2019 Intramuscular   Manufacturer: [employer], [employer]   Lot: [id number]   [place]: [id number]

## 2020-02-23 ENCOUNTER — Other Ambulatory Visit: Payer: Self-pay | Admitting: Gastroenterology

## 2020-02-23 DIAGNOSIS — K580 Irritable bowel syndrome with diarrhea: Secondary | ICD-10-CM

## 2020-02-23 DIAGNOSIS — R1030 Lower abdominal pain, unspecified: Secondary | ICD-10-CM

## 2020-02-23 MED ORDER — DICYCLOMINE 20 MG TABLET
Freq: Every day | ORAL | 0 days
Start: 2020-02-23 — End: ?

## 2020-02-23 NOTE — Telephone Encounter (Signed)
Last office visit 09/29/2019 IBS  Last refill oxybutynin 01/28/2020 0 refills  Bentyl 01/28/2020 0 refills   no appointment is scheduled

## 2020-03-01 ENCOUNTER — Other Ambulatory Visit: Payer: Self-pay | Admitting: Gastroenterology

## 2020-03-08 ENCOUNTER — Ambulatory Visit: Payer: Medicare Other | Attending: Internal Medicine

## 2020-03-08 ENCOUNTER — Other Ambulatory Visit: Payer: Self-pay | Admitting: Gastroenterology

## 2020-03-08 DIAGNOSIS — K580 Irritable bowel syndrome with diarrhea: Secondary | ICD-10-CM

## 2020-03-08 DIAGNOSIS — Z23 Encounter for immunization: Secondary | ICD-10-CM

## 2020-03-08 DIAGNOSIS — R1030 Lower abdominal pain, unspecified: Secondary | ICD-10-CM

## 2020-03-08 NOTE — Progress Notes (Signed)
   Covid-19 Vaccination Clinic  Name:  GIFTY CURRERI    MRN: [id number] DOB: Jul 10, 1966  03/08/2020  Ms. Paulos was observed post Covid-19 immunization for 15 minutes without incident. She was provided with Vaccine Information Sheet and instruction to access the V-Safe system.   Ms. Paskins was instructed to call 911 with any severe reactions post vaccine: Marland Kitchen Difficulty breathing  . Swelling of face and throat  . A fast heartbeat  . A bad rash all over body  . Dizziness and weakness   Immunizations Administered    Name Date Dose VIS Date Route   Pfizer COVID-19 Vaccine 03/08/2020  4:42 PM 0.3 mL 12/22/2018 Intramuscular   Manufacturer: [place]   Lot: [id number]   [place]: [id number]

## 2020-04-18 ENCOUNTER — Encounter: Admit: 2020-04-18 | Payer: MEDICARE | Attending: Hematology | Primary: Hematology

## 2020-05-04 ENCOUNTER — Ambulatory Visit
Admit: 2020-05-04 | Discharge: 2020-05-04 | Payer: MEDICARE | Attending: Hematology & Oncology | Primary: Hematology & Oncology

## 2020-05-04 ENCOUNTER — Other Ambulatory Visit: Admit: 2020-05-04 | Discharge: 2020-05-04 | Payer: MEDICARE

## 2020-05-04 DIAGNOSIS — M545 Low back pain: Secondary | ICD-10-CM

## 2020-05-04 DIAGNOSIS — J0110 Acute frontal sinusitis, unspecified: Principal | ICD-10-CM

## 2020-05-04 DIAGNOSIS — D45 Polycythemia vera: Principal | ICD-10-CM

## 2020-05-04 DIAGNOSIS — G8929 Other chronic pain: Secondary | ICD-10-CM

## 2020-05-04 DIAGNOSIS — D680 Von Willebrand's disease: Principal | ICD-10-CM

## 2020-05-04 MED ORDER — FLUTICASONE PROPIONATE 50 MCG/ACTUATION NASAL SPRAY,SUSPENSION
Freq: Every day | NASAL | 0 refills | 0.00000 days | Status: CP
Start: 2020-05-04 — End: 2021-05-04

## 2020-05-04 MED ORDER — LEVOFLOXACIN 500 MG TABLET: 500 mg | tablet | Freq: Every day | 0 refills | 7 days | Status: AC

## 2020-05-04 MED ORDER — LEVOFLOXACIN 500 MG TABLET
ORAL_TABLET | Freq: Every day | ORAL | 0 refills | 7.00000 days | Status: CP
Start: 2020-05-04 — End: 2020-05-11

## 2020-05-04 MED ORDER — CETIRIZINE 5 MG-PSEUDOEPHEDRINE ER 120 MG TABLET,EXTENDED RELEASE,12HR
ORAL_TABLET | Freq: Two times a day (BID) | ORAL | 11 refills | 30 days | Status: CP
Start: 2020-05-04 — End: 2021-05-04

## 2020-05-04 MED ORDER — HYDROXYUREA 500 MG CAPSULE
ORAL_CAPSULE | Freq: Every day | ORAL | 11 refills | 30.00000 days | Status: CP
Start: 2020-05-04 — End: 2021-05-04

## 2020-06-09 ENCOUNTER — Ambulatory Visit: Admit: 2020-06-09 | Payer: MEDICARE

## 2020-06-21 ENCOUNTER — Encounter: Payer: Self-pay | Admitting: *Deleted

## 2020-06-21 ENCOUNTER — Telehealth: Payer: Self-pay | Admitting: *Deleted

## 2020-06-21 DIAGNOSIS — Z87891 Personal history of nicotine dependence: Secondary | ICD-10-CM

## 2020-06-21 DIAGNOSIS — Z122 Encounter for screening for malignant neoplasm of respiratory organs: Secondary | ICD-10-CM

## 2020-06-21 NOTE — Telephone Encounter (Signed)
Received referral for initial lung cancer screening scan. Contacted patient and obtained smoking history,(current, 79.75 pack year) as well as answering questions related to screening process. Patient denies signs of lung cancer such as weight loss or hemoptysis. Patient denies comorbidity that would prevent curative treatment if lung cancer were found. Patient is scheduled for shared decision making visit and CT scan on 07/04/20 at 115pm.

## 2020-06-25 DIAGNOSIS — J0110 Acute frontal sinusitis, unspecified: Principal | ICD-10-CM

## 2020-06-26 MED ORDER — FLUTICASONE PROPIONATE 50 MCG/ACTUATION NASAL SPRAY,SUSPENSION
0 refills | 0 days | Status: CP
Start: 2020-06-26 — End: ?

## 2020-07-04 ENCOUNTER — Ambulatory Visit
Admission: RE | Admit: 2020-07-04 | Discharge: 2020-07-04 | Disposition: A | Discharge status: Home / Self Care | Payer: Medicare Other | Source: Ambulatory Visit | Attending: Nurse Practitioner | Admitting: Nurse Practitioner

## 2020-07-04 ENCOUNTER — Other Ambulatory Visit: Payer: Self-pay

## 2020-07-04 ENCOUNTER — Encounter: Payer: Self-pay | Admitting: Nurse Practitioner

## 2020-07-04 ENCOUNTER — Inpatient Hospital Stay: Payer: Medicare Other | Attending: Nurse Practitioner | Admitting: Oncology

## 2020-07-04 DIAGNOSIS — Z122 Encounter for screening for malignant neoplasm of respiratory organs: Secondary | ICD-10-CM | POA: Insufficient documentation

## 2020-07-04 DIAGNOSIS — Z87891 Personal history of nicotine dependence: Secondary | ICD-10-CM

## 2020-07-04 DIAGNOSIS — F17210 Nicotine dependence, cigarettes, uncomplicated: Secondary | ICD-10-CM | POA: Diagnosis not present

## 2020-07-04 DIAGNOSIS — J439 Emphysema, unspecified: Secondary | ICD-10-CM | POA: Diagnosis not present

## 2020-07-04 NOTE — Progress Notes (Signed)
Virtual Visit via Video Note  I connected with Lauren Escobar on 07/04/20 at  1:15 PM EDT by a video enabled telemedicine application and verified that I am speaking with the correct person using two identifiers.  Location: Patient: [facility] Provider: Clinic   I discussed the limitations of evaluation and management by telemedicine and the availability of in person appointments. The patient expressed understanding and agreed to proceed.  I discussed the assessment and treatment plan with the patient. The patient was provided an opportunity to ask questions and all were answered. The patient agreed with the plan and demonstrated an understanding of the instructions.   The patient was advised to call back or seek an in-person evaluation if the symptoms worsen or if the condition fails to improve as anticipated.   In accordance with CMS guidelines, patient has met eligibility criteria including age, absence of signs or symptoms of lung cancer.  Social History   Tobacco Use  . Smoking status: Current Every Day Smoker    Packs/day: 2.75    Years: 29.00    Pack years: 79.75    Types: Cigarettes  . Smokeless tobacco: Never Used  Substance Use Topics  . Alcohol use: No    Alcohol/week: 0.0 standard drinks  . Drug use: No      A shared decision-making session was conducted prior to the performance of CT scan. This includes one or more decision aids, includes benefits and harms of screening, follow-up diagnostic testing, over-diagnosis, false positive rate, and total radiation exposure.   Counseling on the importance of adherence to annual lung cancer LDCT screening, impact of co-morbidities, and ability or willingness to undergo diagnosis and treatment is imperative for compliance of the program.   Counseling on the importance of continued smoking cessation for former smokers; the importance of smoking cessation for current smokers, and information about tobacco cessation interventions have been  given to patient including [place] and [facility] quit [place] programs.   Written order for lung cancer screening with LDCT has been given to the patient and any and all questions have been answered to the best of my abilities.    Yearly follow up will be coordinated by Burgess Estelle, Thoracic Navigator.  I provided 15 minutes of face-to-face video visit time during this encounter, and > 50% was spent counseling as documented under my assessment & plan.   Jacquelin Hawking, NP

## 2020-07-05 ENCOUNTER — Telehealth: Payer: Self-pay | Admitting: *Deleted

## 2020-07-05 NOTE — Telephone Encounter (Signed)
Notified patient of LDCT lung cancer screening program results with recommendation for 12 month follow up imaging. Also notified of incidental findings noted below and is encouraged to discuss further with PCP who will receive a copy of this note and/or the CT report. Patient verbalizes understanding.   IMPRESSION: 1. Lung-RADS 2, benign appearance or behavior. Continue annual screening with low-dose chest CT without contrast in 12 months. 2. Emphysema (ICD10-J43.9).

## 2020-08-07 ENCOUNTER — Encounter: Admit: 2020-08-07 | Discharge: 2020-08-08 | Payer: MEDICARE

## 2020-08-07 ENCOUNTER — Encounter: Admit: 2020-08-07 | Discharge: 2020-08-08 | Payer: MEDICARE | Attending: Adult Health | Primary: Adult Health

## 2020-08-07 DIAGNOSIS — M25551 Pain in right hip: Principal | ICD-10-CM

## 2020-08-07 DIAGNOSIS — G8929 Other chronic pain: Secondary | ICD-10-CM

## 2020-08-07 DIAGNOSIS — D45 Polycythemia vera: Principal | ICD-10-CM

## 2020-08-07 DIAGNOSIS — H6692 Otitis media, unspecified, left ear: Principal | ICD-10-CM

## 2020-08-07 MED ORDER — AMOXICILLIN 875 MG-POTASSIUM CLAVULANATE 125 MG TABLET
ORAL_TABLET | Freq: Two times a day (BID) | ORAL | 0 refills | 14 days | Status: CP
Start: 2020-08-07 — End: 2020-08-21

## 2020-08-15 ENCOUNTER — Other Ambulatory Visit: Payer: Self-pay | Admitting: Family Medicine

## 2020-08-15 DIAGNOSIS — Z1231 Encounter for screening mammogram for malignant neoplasm of breast: Secondary | ICD-10-CM

## 2020-08-22 MED ORDER — HYDROCODONE 5 MG-ACETAMINOPHEN 325 MG TABLET
ORAL_TABLET | Freq: Four times a day (QID) | ORAL | 0 refills | 5 days | Status: CP | PRN
Start: 2020-08-22 — End: 2020-08-27

## 2020-09-29 DIAGNOSIS — D45 Polycythemia vera: Principal | ICD-10-CM

## 2020-09-29 MED ORDER — HYDROXYUREA 500 MG CAPSULE
ORAL_CAPSULE | 3 refills | 0 days | Status: CP
Start: 2020-09-29 — End: 2020-11-21

## 2020-10-03 DIAGNOSIS — R232 Flushing: Principal | ICD-10-CM

## 2020-10-04 ENCOUNTER — Ambulatory Visit: Admit: 2020-10-04 | Discharge: 2020-10-05 | Payer: MEDICARE

## 2020-10-04 DIAGNOSIS — M25551 Pain in right hip: Principal | ICD-10-CM

## 2020-10-04 DIAGNOSIS — M25562 Pain in left knee: Principal | ICD-10-CM

## 2020-10-04 DIAGNOSIS — G8929 Other chronic pain: Principal | ICD-10-CM

## 2020-10-04 DIAGNOSIS — G894 Chronic pain syndrome: Principal | ICD-10-CM

## 2020-10-05 MED ORDER — CONJUGATED ESTROGENS 0.625 MG TABLET
ORAL_TABLET | 0 refills | 0 days
Start: 2020-10-05 — End: ?

## 2020-10-30 DIAGNOSIS — R232 Flushing: Principal | ICD-10-CM

## 2020-10-30 DIAGNOSIS — R3915 Urgency of urination: Principal | ICD-10-CM

## 2020-10-31 MED ORDER — CONJUGATED ESTROGENS 0.625 MG TABLET
ORAL_TABLET | 0 refills | 0 days
Start: 2020-10-31 — End: ?

## 2020-11-01 MED ORDER — MYRBETRIQ 50 MG TABLET,EXTENDED RELEASE
ORAL_TABLET | Freq: Every day | ORAL | 0 refills | 30.00000 days | Status: CP
Start: 2020-11-01 — End: 2020-11-21

## 2020-11-06 MED ORDER — DOXYCYCLINE MONOHYDRATE 100 MG CAPSULE
Freq: Every day | 0.00000 days
Start: 2020-11-06 — End: 2020-12-14

## 2020-11-07 MED ORDER — TRINTELLIX 20 MG TABLET
Freq: Every day | 0 days
Start: 2020-11-07 — End: ?

## 2020-11-09 ENCOUNTER — Ambulatory Visit
Admit: 2020-11-09 | Discharge: 2020-11-10 | Payer: MEDICARE | Attending: Hematology & Oncology | Primary: Hematology & Oncology

## 2020-11-09 ENCOUNTER — Encounter: Admit: 2020-11-09 | Discharge: 2020-11-10 | Payer: MEDICARE

## 2020-11-09 DIAGNOSIS — N61 Mastitis without abscess: Principal | ICD-10-CM

## 2020-11-09 DIAGNOSIS — Z122 Encounter for screening for malignant neoplasm of respiratory organs: Principal | ICD-10-CM

## 2020-11-09 DIAGNOSIS — Z87891 Personal history of nicotine dependence: Principal | ICD-10-CM

## 2020-11-09 DIAGNOSIS — D45 Polycythemia vera: Principal | ICD-10-CM

## 2020-11-09 MED ORDER — RUXOLITINIB 10 MG TABLET
ORAL_TABLET | Freq: Two times a day (BID) | ORAL | 1 refills | 30.00000 days | Status: CP
Start: 2020-11-09 — End: 2020-12-09
  Filled 2020-11-20: qty 60, 30d supply, fill #0

## 2020-11-09 MED ORDER — ATORVASTATIN 20 MG TABLET
ORAL_TABLET | Freq: Every day | ORAL | 11 refills | 30 days | Status: CP
Start: 2020-11-09 — End: 2021-11-09

## 2020-11-10 DIAGNOSIS — N61 Mastitis without abscess: Principal | ICD-10-CM

## 2020-11-13 DIAGNOSIS — D45 Polycythemia vera: Principal | ICD-10-CM

## 2020-11-14 MED ORDER — BELSOMRA 20 MG TABLET
Freq: Every day | 0 days
Start: 2020-11-14 — End: ?

## 2020-11-17 NOTE — Unmapped (Signed)
[facility] Specialty Medication Onboarding    Specialty Medication: Jakafi 10mg  tablet  Prior Authorization: Approved   Financial Assistance: No - copay  <$25  Final Copay/Day Supply: $0 / 30 days    Insurance Restrictions: None     Notes to Pharmacist:     The triage team has completed the benefits investigation and has determined that the patient is able to fill this medication at [facility]. Please contact the patient to complete the onboarding or follow up with the prescribing physician as needed.

## 2020-11-20 NOTE — Unmapped (Signed)
[facility]   Patient Onboarding/Medication Counseling    Ms.Rohe is a 55 y.o. 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  I am speaking to the patient.    Was a Nurse, learning disability used for this call? No    Verified patient's date of birth / HIPAA.    Specialty medication(s) to be sent: Hematology/Oncology: [name]    Non-specialty medications/supplies to be sent: none    Medications not needed at this time: none     Jakafi (ruxolitinib)    Medication & Administration     Dosage: Take 1 tablet (10 mg total) by mouth Two (2) times a day.    Administration:   ??? Take with or without food    Adherence/Missed dose instructions:  ??? Skip the missed dose and go back to your normal time.  ??? Do not take 2 doses at the same time or extra doses.    Goals of Therapy   ??? It is used to treat myelofibrosis.  ??? It is used to treat polycythemia vera.  ??? It is used to treat graft-versus-host disease (GVHD)    Side Effects & Monitoring Parameters   ??? Common side effects  ??? Diarrhea, constipation, stomach pain or gas  ??? Dizziness  ??? Headache  ??? Muscle spasm  ??? Weight gain  ??? Nose or throat irritation  ??? Joint pain    ??? The following side effects should be reported to the provider  ??? Allergic reaction  ??? Infection like symptoms  ??? Unexplained bleeding or bruising  ??? Painful skin rash with blisters  ??? Upset stomach or vomiting  ??? Shortness of breath  ??? Feeling tired or weak  ??? Swelling  ??? Confusion, memory problems, low mood, changes in behavior, change in strength on 1 side is greater than the other, trouble speaking or thinking, change in balance, or change in eyesight.  ??? Change in color or size of a mole, any new or changing skin lump or growth    ??? Monitoring Parameters:  ??? CBC baseline and every 2-4 weeks  ??? Lipid panels 8-12 weeks after therapy initiated  ??? TB skin test prior to starting    Contraindications, Warnings, & Precautions     ??? Hematologic toxicity, including thrombocytopenia, anemia, and neutropenia  ??? Infections  ??? Lipid abnormalities  ??? Non-melanoma skin cancer  ??? Use caution in patients with hepatic or renal impairment  ??? Withdrawal syndrome - fever, respiratory distress, hypotension, multiorgan failure, splenomegaly, worsening cytopenias, hemodynamic compensation, and septic shock-like syndrome have been reported with treatment tapering or discontinuation. Patients should not interrupt/discontinue treatment without consulting a healthcare provider.    Pregnancy & Lactation Considerations  ??? Not recommended for use during pregnancy  ??? Breastfeeding is not recommended during treatment and for 2 weeks after the last dose    Drug/Food Interactions     ??? Medication list reviewed in Epic. The patient was instructed to inform the care team before taking any new medications or supplements. No drug interactions identified.   ??? Avoid grapefruit juice  ??? Ask your doctor before getting any live or inactivated vaccinations    Storage, Handling Precautions, & Disposal     ??? Store at room temperature  ??? Keep away from children and pets  ??? Caregivers recommended to wear gloves when handling medication      Current Medications (including OTC/herbals), Comorbidities and Allergies     Current Outpatient Medications  Medication Sig Dispense Refill   ??? ADVAIR DISKUS 250-50 mcg/dose diskus Inhale 1 puff 2 (two) times a day.      ??? amitriptyline (ELAVIL) 50 MG tablet TAKE 1 TABLET BY MOUTH EVERYDAY AT BEDTIME     ??? aspirin (ECOTRIN) 81 MG tablet Take 1 tablet (81 mg total) by mouth daily. 150 tablet 2   ??? atorvastatin (LIPITOR) 20 MG tablet Take 20 mg by mouth daily.      ??? atorvastatin (LIPITOR) 20 MG tablet Take 1 tablet (20 mg total) by mouth daily. 30 tablet 11   ??? cetirizine (ZYRTEC) 10 MG tablet TAKE 1 TABLET BY MOUTH DAILY FOR ALLERGIES     ??? cetirizine-pseudoephedrine (ZYRTEC-D) 5-120 mg per tablet Take 1 tablet by mouth Two (2) times a day. 60 tablet 11   ??? ciprofloxacin HCl (CIPRO) 250 MG tablet 1 BY MOUTH TWICE A DAY FOR 3 DAYS FOR UTI     ??? dicyclomine (BENTYL) 20 mg tablet Take 20 mg by mouth.     ??? diphenoxylate-atropine (LOMOTIL) 2.5-0.025 mg per tablet Take 1 tablet by mouth four (4) times a day as needed.      ??? esomeprazole (NEXIUM) 40 MG capsule Take 40 mg by mouth daily.     ??? fluticasone propionate (FLONASE) 50 mcg/actuation nasal spray SPRAY 1 SPRAY INTO EACH NOSTRIL EVERY DAY 16 mL 0   ??? folic acid (FOLVITE) 1 MG tablet Take 1,000 mcg by mouth daily.     ??? hydroxyurea (HYDREA) 500 mg capsule TAKE 1 CAPSULE (500 MG TOTAL) BY MOUTH DAILY. 90 capsule 3   ??? ipratropium-albuterol (DUO-NEB) 0.5-2.5 mg/3 mL nebulizer Inhale 3 mL by nebulization every six (6) hours as needed. 360 mL 11   ??? isosorbide mononitrate (IMDUR) 30 MG 24 hr tablet Take 30 mg by mouth daily.      ??? LATUDA 80 mg Tab TAKE 1 TABLET BY MOUTH EVERY DAY 30 tablet 0   ??? miscellaneous medical supply Misc Cefaly for migraine headache     ??? MYRBETRIQ 50 mg Tb24 extended-release tablets Take 1 tablet (50 mg total) by mouth daily. 30 tablet 0   ??? nitroglycerin (NITROSTAT) 0.4 MG SL tablet      ??? oxybutynin (DITROPAN-XL) 5 MG 24 hr tablet TAKE 1 TABLET BY MOUTH EVERYDAY AT BEDTIME     ??? PREMARIN 0.625 mg tablet TAKE 1 TABLET (0.625 MG TOTAL) BY MOUTH DAILY. 30 tablet 0   ??? propranoloL (INDERAL) 40 MG tablet Take 1 tablet by mouth daily.     ??? REXULTI 0.5 mg Tab tablet TAKE 1 TABLET BY MOUTH AT BEDTIME FOR 1 WEEK THEN INCREASE TO 1 MG TABLET     ??? ruxolitinib (JAKAFI) 10 mg tablet Take 1 tablet (10 mg total) by mouth Two (2) times a day. 60 tablet 1   ??? SPIRIVA WITH HANDIHALER 18 mcg inhalation capsule INHALE 1 CAP INHALATION ONCE DAILY     ??? SUMAtriptan (IMITREX) 50 MG tablet Take 50 mg by mouth every two (2) hours as needed.      ??? tamsulosin (FLOMAX) 0.4 mg capsule Take 1 capsule (0.4 mg total) by mouth daily. 90 capsule 11   ??? triamcinolone (KENALOG) 0.1 % cream APPLY TO AFFECTED AREA (BACK OF NECK) TWICE DAILY FOR UP TO 2 WEEKS FOR CONTACT DERMATITIS     ??? venlafaxine (EFFEXOR-XR) 150 MG 24 hr capsule TAKE 2 CAPSULES BY MOUTH EVERY MORNING     ??? vilazodone (VIIBRYD) 40 mg Tab Take 1  tablet (40 mg total) by mouth Two (2) times a day. 60 tablet 0   ??? VRAYLAR 1.5 mg capsule TAKE 1 CAPSULE BY MOUTH EVERY DAY IN THE MORNING     ??? VYVANSE 30 mg capsule Take 1 capsule by mouth daily.       No current facility-administered medications for this visit.       Allergies   Allergen Reactions   ??? Lyrica [Pregabalin] Other (See Comments)     Unsteady gate, causing falls   ??? Gabapentin Nausea And Vomiting   ??? Tramadol Rash       Patient Active Problem List   Diagnosis   ??? Chronic interstitial cystitis   ??? Dyspareunia   ??? Gross hematuria   ??? Incomplete bladder emptying   ??? Polycythemia vera (CMS-HCC)   ??? Von Willebrand's disease (CMS-HCC)   ??? Atony of bladder   ??? Overactive detrusor   ??? Female stress incontinence   ??? Diabetic peripheral neuropathy (CMS-HCC)   ??? Type I diabetes mellitus (CMS-HCC)   ??? Asthma without status asthmaticus   ??? Chest pain   ??? Chronic cough   ??? Headache disorder   ??? Congenital nystagmus   ??? Mechanical breakdown of implanted electronic neurostimulator of peripheral nerve (CMS-HCC)   ??? Candidiasis of female genitalia   ??? Pain medication agreement signed   ??? Abdominal pain, acute, right lower quadrant   ??? Chronic hip pain   ??? Balance problem   ??? Dysuria   ??? Neoplasm of uncertain behavior of bladder   ??? Bladder outlet obstruction       Reviewed and up to date in Epic.    Appropriateness of Therapy     Is medication and dose appropriate based on diagnosis? Yes    Prescription has been clinically reviewed: Yes    Baseline Quality of Life Assessment      How many days over the past month did your polycythemia vera  keep you from your normal activities? For example, brushing your teeth or getting up in the morning. 0    Financial Information     Medication Assistance provided: Prior Authorization    Anticipated copay of $0 / 30 days reviewed with patient. Verified delivery address.    Delivery Information     Scheduled delivery date: 11/21/20    Expected start date: 11/21/20    Medication will be delivered via Next Day Courier to the prescription address in [facility].  This shipment will not require a signature.      Explained the services we provide at [facility] and that each month we would call to set up refills.  Stressed importance of returning phone calls so that we could ensure they receive their medications in time each month.  Informed patient that we should be setting up refills 7-10 days prior to when they will run out of medication.  A pharmacist will reach out to perform a clinical assessment periodically.  Informed patient that a welcome packet and a drug information handout will be sent.      Patient verbalized understanding of the above information as well as how to contact the pharmacy at [phone] option 4 with any questions/concerns.  The pharmacy is open Monday through Friday 8:30am-4:30pm.  A pharmacist is available 24/7 via pager to answer any clinical questions they may have.    Patient Specific Needs     - Does the patient have any physical, cognitive, or cultural barriers? No    -  Patient prefers to have medications discussed with  Patient     - Is the patient or caregiver able to read and understand education materials at a high school level or above? Yes    - Patient's primary language is  English     - Is the patient high risk? Yes, patient is taking oral chemotherapy. Appropriateness of therapy as been assessed    - Does the patient require a Care Management Plan? No     - Does the patient require physician intervention or other additional services (i.e. nutrition, smoking cessation, social work)? No      Tyrek Lawhorn [facility] Specialty Pharmacist

## 2020-11-21 ENCOUNTER — Institutional Professional Consult (permissible substitution): Admit: 2020-11-21 | Discharge: 2020-11-22 | Payer: MEDICARE | Attending: Pharmacist | Primary: Pharmacist

## 2020-11-21 NOTE — Unmapped (Cosign Needed)
Courtney Erickson is a 55 y.o. 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 She also struggles with muscle cramping, migraine headaches, depression, fatigue, pruritis, all which could be related to MPN or complicate treatment choices. Of note she is on various inhalers, bladder medications, and statin. She reports taking hydrea 1000 mg daily with Jakafi planning on arriving by mail today.     Most recent labs from 1/13 include Hgb 13.2, PLT 279, and ANC 3.0. CMP stable, but notable for slight LFT bump. No recent lipids but on statin so baseline likely controlled.    Oncologic History:  Oncology History Overview Note   -- Patient with a reported history of polycythemia vera, treated with phlebotomy and Hydrea. Reportedly had splenomegaly, but CT scan of abdomen and pelvis from 09/06/13 and again on 05/03/2014, at [facility] stated normal spleen and liver. There is report of mild thickening of the wall of the colon, suggestive of chronic inflammation. The patient had a normal chest CT on 11/09/14 with the exception of mild dilatation of the pulmonary trunk.     Polycythemia vera (CMS-HCC)   07/02/2012 Initial Diagnosis    Polycythemia vera (RAF-HCC)         Weight and Vitals:  Wt Readings from Last 3 Encounters:   11/09/20 64.9 kg (143 lb)   10/04/20 63.7 kg (140 lb 8 oz)   08/07/20 65.9 kg (145 lb 3.2 oz)     Temp Readings from Last 3 Encounters:   11/09/20 36.6 ??C (97.8 ??F) (Tympanic)   10/04/20 36.2 ??C (97.1 ??F)   08/07/20 37 ??C (98.6 ??F) (Oral)     BP Readings from Last 3 Encounters:   11/09/20 94/66   10/04/20 119/85 08/07/20 114/81     Pulse Readings from Last 3 Encounters:   11/09/20 81   10/04/20 87   08/07/20 98       Pertinent Labs:  No visits with results within 1 Day(s) from this visit.   Latest known visit with results is:   Lab on 11/09/2020   Component Date Value Ref Range Status   ??? Sodium 11/09/2020 138  135 - 145 mmol/L Final   ??? Potassium 11/09/2020 4.3  3.4 - 4.5 mmol/L Final   ??? Chloride 11/09/2020 110* 98 - 107 mmol/L Final   ??? Anion Gap 11/09/2020 2* 5 - 14 mmol/L Final   ??? CO2 11/09/2020 26.0  20.0 - 31.0 mmol/L Final   ??? BUN 11/09/2020 14  9 - 23 mg/dL Final   ??? Creatinine 11/09/2020 0.62  0.60 - 0.80 mg/dL Final   ??? BUN/Creatinine Ratio 11/09/2020 23   Final   ??? EGFR CKD-EPI Non-African American,* 11/09/2020 >90  >=60 mL/min/1.32m2 Final   ??? EGFR CKD-EPI African American, Fem* 11/09/2020 >90  >=60 mL/min/1.89m2 Final   ??? Glucose 11/09/2020 102  70 - 179 mg/dL Final   ??? Calcium 16/07/9603 9.6  8.7 - 10.4 mg/dL Final   ??? Albumin 54/06/8118 3.8  3.4 - 5.0 g/dL Final   ??? Total Protein 11/09/2020 7.0  5.7 - 8.2 g/dL Final   ???  Total Bilirubin 11/09/2020 1.1  0.3 - 1.2 mg/dL Final   ??? AST 29/56/2130 79* <=34 U/L Final   ??? ALT 11/09/2020 59* 10 - 49 U/L Final   ??? Alkaline Phosphatase 11/09/2020 76  46 - 116 U/L Final   ??? LDH 11/09/2020 167  120 - 246 U/L Final   ??? WBC 11/09/2020 5.6  4.5 - 11.0 10*9/L Final   ??? RBC 11/09/2020 3.57* 4.00 - 5.20 10*12/L Final   ??? HGB 11/09/2020 13.2  12.0 - 16.0 g/dL Final   ??? HCT 86/57/8469 41.6  36.0 - 46.0 % Final   ??? MCV 11/09/2020 116.7* 80.0 - 100.0 fL Final   ??? MCH 11/09/2020 36.9* 26.0 - 34.0 pg Final   ??? MCHC 11/09/2020 31.6  31.0 - 37.0 g/dL Final   ??? RDW 62/95/2841 16.2* 12.0 - 15.0 % Final   ??? MPV 11/09/2020 9.5  7.0 - 10.0 fL Final   ??? Platelet 11/09/2020 279  150 - 440 10*9/L Final   ??? Neutrophils % 11/09/2020 54.3  % Final   ??? Lymphocytes % 11/09/2020 35.4  % Final   ??? Monocytes % 11/09/2020 5.3  % Final   ??? Eosinophils % 11/09/2020 1.9  % Final   ??? Basophils % 11/09/2020 0.5  % Final   ??? Absolute Neutrophils 11/09/2020 3.0  2.0 - 7.5 10*9/L Final   ??? Absolute Lymphocytes 11/09/2020 2.0  1.5 - 5.0 10*9/L Final   ??? Absolute Monocytes 11/09/2020 0.3  0.2 - 0.8 10*9/L Final   ??? Absolute Eosinophils 11/09/2020 0.1  0.0 - 0.4 10*9/L Final   ??? Absolute Basophils 11/09/2020 0.0  0.0 - 0.1 10*9/L Final   ??? Large Unstained Cells 11/09/2020 3  0 - 4 % Final   ??? Macrocytosis 11/09/2020 Marked* Not Present Final   ??? Anisocytosis 11/09/2020 Slight* Not Present Final   ??? Hypochromasia 11/09/2020 Slight* Not Present Final   ??? Smear Review Comments 11/09/2020 See Comment* Undefined Final    Slide reviewed.    ??? Hypersegmented Neutrophils 11/09/2020 Present* Not Present Final       Allergies:   Allergies   Allergen Reactions   ??? Lyrica [Pregabalin] Other (See Comments)     Unsteady gate, causing falls   ??? Gabapentin Nausea And Vomiting   ??? Tramadol Rash       Drug Interactions: no notable clinically concerning DDI with jakafi; she has polypharmacy with numerous other meds (monitor for serotonergic, anticholinergic, BP/HR lowering, and EPS toxicities)      Current Medications:  Current Outpatient Medications   Medication Sig Dispense Refill   ??? ADVAIR DISKUS 250-50 mcg/dose diskus Inhale 1 puff 2 (two) times a day.      ??? amitriptyline (ELAVIL) 50 MG tablet TAKE 1 TABLET BY MOUTH EVERYDAY AT BEDTIME     ??? aspirin (ECOTRIN) 81 MG tablet Take 1 tablet (81 mg total) by mouth daily. 150 tablet 2   ??? atorvastatin (LIPITOR) 20 MG tablet Take 1 tablet (20 mg total) by mouth daily. 30 tablet 11   ??? esomeprazole (NEXIUM) 40 MG capsule Take 40 mg by mouth daily.     ??? fluticasone propionate (FLONASE) 50 mcg/actuation nasal spray SPRAY 1 SPRAY INTO EACH NOSTRIL EVERY DAY 16 mL 0   ??? folic acid (FOLVITE) 1 MG tablet Take 1,000 mcg by mouth daily.     ??? ipratropium-albuterol (DUO-NEB) 0.5-2.5 mg/3 mL nebulizer Inhale 3 mL by nebulization every six (6) hours as needed. 360 mL 11   ???  isosorbide mononitrate (IMDUR) 30 MG 24 hr tablet Take 30 mg by mouth daily.      ??? LATUDA 80 mg Tab TAKE 1 TABLET BY MOUTH EVERY DAY 30 tablet 0   ??? oxybutynin (DITROPAN-XL) 5 MG 24 hr tablet TAKE 1 TABLET BY MOUTH EVERYDAY AT BEDTIME     ??? PREMARIN 0.625 mg tablet TAKE 1 TABLET (0.625 MG TOTAL) BY MOUTH DAILY. 30 tablet 0   ??? propranoloL (INDERAL) 40 MG tablet Take 1 tablet by mouth daily.     ??? SPIRIVA WITH HANDIHALER 18 mcg inhalation capsule INHALE 1 CAP INHALATION ONCE DAILY     ??? SUMAtriptan (IMITREX) 50 MG tablet Take 50 mg by mouth every two (2) hours as needed.      ??? tamsulosin (FLOMAX) 0.4 mg capsule Take 1 capsule (0.4 mg total) by mouth daily. 90 capsule 11   ??? venlafaxine (EFFEXOR-XR) 150 MG 24 hr capsule TAKE 2 CAPSULES BY MOUTH EVERY MORNING     ??? vilazodone (VIIBRYD) 40 mg Tab Take 1 tablet (40 mg total) by mouth Two (2) times a day. 60 tablet 0   ??? VYVANSE 70 mg capsule Take 1 capsule by mouth daily.      ??? nitroglycerin (NITROSTAT) 0.4 MG SL tablet Place 0.4 mg under the tongue daily as needed.      ??? ruxolitinib (JAKAFI) 10 mg tablet Take 1 tablet (10 mg total) by mouth Two (2) times a day. 60 tablet 1     No current facility-administered medications for this visit.       Adherence: no issues identified      Assessment: CourtneyErickson is a 55 y.o. female with MPN (PCV) being initiated on Jakafi 10 mg BID    Plan:   - STOP hydrea  - START Jakafi 10 mg BID  - Continue baby aspirin 81 mg daily  - CTM for supportive care needs related to MPN symptoms  - Discussed Jakafi refills/cost (has grant so $0), administration (take BID with or without food), goal of therapy (MPN symptom management, reduction of blood counts), ADRs (diarrhea, muscle spasms, HA/dizziness, SOB, lab changes such as lipids, LFTs, blood counts). Consider adding lipid panel and increasing atorva if elevated.   - She should RTC on 2/17 as scheduled for labs and MD/CPP visit.   - Potential future plan may include interferon, although complicated by her PS as well as underlying depression    F/u:  Future Appointments   Date Time Provider Department Center   11/22/2020 10:00 AM [facility]MO RM 2 [facility] [place]   11/22/2020 12:00 PM Genia Del, ANP [facility]ULT[facility]   12/08/2020  8:00 AM Vickii Penna, MD [facility]   12/13/2020  8:00 AM HBR CT RM 1 [facility] [place] [facility]   12/14/2020  9:30 AM ADULT ONC LAB [facility]   12/14/2020 10:40 AM Halford Decamp, MD [id number] [facility]         Manfred Arch, PharmD, BCOP, CPP  Pager: [phone]        The patient reports they are currently: at home. I spent 20 minutes on the phone with the patient on the date of service. I spent an additional 5 minutes on pre- and post-visit activities on the date of service.     The patient was physically located in [place] or a state in which I am permitted to provide care. The patient and/or parent/guardian understood that s/he may incur co-pays and cost  sharing, and agreed to the telemedicine visit. The visit was reasonable and appropriate under the circumstances given the patient's presentation at the time.    The patient and/or parent/guardian has been advised of the potential risks and limitations of this mode of treatment (including, but not limited to, the absence of in-person examination) and has agreed to be treated using telemedicine. The patient's/patient's family's questions regarding telemedicine have been answered.     If the visit was completed in an ambulatory setting, the patient and/or parent/guardian has also been advised to contact their provider???s office for worsening conditions, and seek emergency medical treatment and/or call 911 if the patient deems either necessary.

## 2020-11-22 ENCOUNTER — Encounter: Admit: 2020-11-22 | Discharge: 2020-11-23 | Payer: MEDICARE

## 2020-11-22 DIAGNOSIS — Z96652 Presence of left artificial knee joint: Principal | ICD-10-CM

## 2020-11-22 DIAGNOSIS — N61 Mastitis without abscess: Principal | ICD-10-CM

## 2020-11-22 DIAGNOSIS — R928 Other abnormal and inconclusive findings on diagnostic imaging of breast: Principal | ICD-10-CM

## 2020-11-22 DIAGNOSIS — E119 Type 2 diabetes mellitus without complications: Principal | ICD-10-CM

## 2020-11-22 DIAGNOSIS — R350 Frequency of micturition: Principal | ICD-10-CM

## 2020-11-22 DIAGNOSIS — N3941 Urge incontinence: Principal | ICD-10-CM

## 2020-11-22 DIAGNOSIS — K219 Gastro-esophageal reflux disease without esophagitis: Principal | ICD-10-CM

## 2020-11-22 DIAGNOSIS — J45909 Unspecified asthma, uncomplicated: Principal | ICD-10-CM

## 2020-11-22 DIAGNOSIS — D680 Von Willebrand's disease: Principal | ICD-10-CM

## 2020-11-22 DIAGNOSIS — Z79899 Other long term (current) drug therapy: Principal | ICD-10-CM

## 2020-11-22 DIAGNOSIS — F17210 Nicotine dependence, cigarettes, uncomplicated: Principal | ICD-10-CM

## 2020-11-22 DIAGNOSIS — Z7982 Long term (current) use of aspirin: Principal | ICD-10-CM

## 2020-11-22 DIAGNOSIS — F419 Anxiety disorder, unspecified: Principal | ICD-10-CM

## 2020-11-22 DIAGNOSIS — F329 Major depressive disorder, single episode, unspecified: Principal | ICD-10-CM

## 2020-11-22 MED ORDER — DOXYCYCLINE HYCLATE 100 MG TABLET
ORAL_TABLET | Freq: Two times a day (BID) | ORAL | 0 refills | 10.00000 days | Status: CP
Start: 2020-11-22 — End: 2020-12-14

## 2020-11-22 NOTE — Unmapped (Signed)
Patient Name: Courtney Erickson  Patient Age: 55 y.o.  Encounter Date: 11/22/2020    Referring Physician:   Halford Decamp, MD  [address]  [id number]  [place],  [place] [address]    Primary Care Provider:  Carron Brazen, MD    Supervising Physician  Dr. Debbrah Alar      Reason for Visit:   Chief Complaint   Patient presents with   ??? New Diagnosis       HPI:    Courtney Erickson is a 55 y.o. female who is seen in consultation at the request of Leonard Downing* for abnormal mammogram.  The patient's medical record has been reviewed the patient is interviewed.  She states that she has been having left breast pain for a few weeks now.  Also reports yellow nipple discharge.  She was seen in urgent care and was given a couple of courses of doxycycline.  Her last antibiotic was last Friday.  She never noticed any redness to the skin other than the nipple discharge.  She is an every day smoker.  Her pain scale in the breast is an 8 she uses 4 extra strength Tylenol without any relief.  Not having any fevers or chills.  He did have imaging studies that identified in the left upper outer quadrant a 1.3 cm asymmetry for which biopsy is recommended.  Scheduled for January 28.  Has no family history for breast cancer.    Review of Systems:  Other than her breast complaint remaining 10 systems are negative    Reproductive History:  Patient is postmenopausal, menarche at 39    Medical History:  Past Medical History:   Diagnosis Date   ??? Abnormal findings on esophagogastroduodenoscopy (EGD) 12/16/2013    For abdominal discomfort; DDAVP; with EUS; no abnormal findings, and pancreas unremarkable   ??? ANA positive    ??? Anxiety    ??? Asthma    ??? Atony of bladder    ??? Chronic interstitial cystitis    ??? COPD (chronic obstructive pulmonary disease) (CMS-HCC)    ??? Depression    ??? Diabetes (CMS-HCC)    ??? Difficult intravenous access    ??? Dyspareunia    ??? Endometriosis    ??? Female stress incontinence    ??? Gastric ulcer    ??? GERD (gastroesophageal reflux disease)    ??? Gross hematuria    ??? H/O colonoscopy 12/2011   ??? Incomplete bladder emptying    ??? Incontinence without sensory awareness    ??? Other chronic cystitis    ??? Other functional disorder of bladder    ??? Pain medication agreement signed 08/19/2012   ??? Polycythemia vera(238.4)    ??? Scoliosis    ??? Urge incontinence    ??? Urinary frequency    ??? Von Willebrand's disease (CMS-HCC)        Surgical History:  Past Surgical History:   Procedure Laterality Date   ??? APPENDECTOMY     ??? BREAST BIOPSY Right     age 24     benign   ??? HYSTERECTOMY  1999    total   ??? InterStim Device  2003    Bladder Stimulator   ??? OOPHORECTOMY     ??? PR IMPLANT PERIPH/GASTRIC NEUROSTIM/RECEIVER Right 02/06/2015    Procedure: INSERTION OR REPLACEMENT PERIPHERAL/GASTRIC NEUROSTIMULATOR PULSE GENERAT/RECEIVE, DIRECT/INDUCTIV COUPLING;  Surgeon: Smith Robert, MD;  Location: [facility] OR [facility];  Service: Urology   ???  PR REVISE/REMOVE PERIPH/GASTRIC NEUROSTIM/RECEIVER N/A 09/22/2017    Procedure: REVISION OR REMOVAL OF PERIPHERAL OR GASTRIC NEUROSTIMULATOR PULSE GENERATOR OR RECEIVER;  Surgeon: Norval Morton, MD;  Location[facility];  Service: Urology   ??? PR REVISE/REMOVE PERIPHERAL NEUROELECTRODE N/A 09/22/2017    Procedure: REVISION OR REMOVAL OF PERIPHERAL NEUROSTIMULATOR ELECTRODE ARRAY;  Surgeon: Norval Morton, MD;  Location: [facility] OR [facility];  Service: Urology   ??? ROTATOR CUFF REPAIR Left     x 2   ??? TOTAL KNEE ARTHROPLASTY Left 2006    Given Humate-P       Family History:  Family History   Problem Relation Age of Onset   ??? Hyperlipidemia Sister    ??? GU problems Mother    ??? Cancer Mother 53        Bladder cancer   ??? Cancer Father 83        Colon cancer   ??? Prostate cancer Neg Hx    ??? Kidney cancer Neg Hx    ??? Urolithiasis Neg Hx        Social History:  Tobacco use:   Social History     Tobacco Use   Smoking Status Current Some Day Smoker   ??? Packs/day: 0.50   ??? Years: 30.00 ??? Pack years: 15.00   ??? Types: Cigarettes   ??? Start date: 05/13/1999   ??? Last attempt to quit: 11/11/2016   ??? Years since quitting: 4.0   Smokeless Tobacco Never Used   Tobacco Comment    Quit but started back     Alcohol use:   Social History     Substance and Sexual Activity   Alcohol Use No   ??? Alcohol/week: 0.0 standard drinks    Comment: denies     Drug use:   Social History     Substance and Sexual Activity   Drug Use No     Living situation: Patient lives at home with her daughter.  She does not drink alcohol and smokes at least 3 cigarettes a day    Medications:     Current Outpatient Medications:   ???  ADVAIR DISKUS 250-50 mcg/dose diskus, Inhale 1 puff 2 (two) times a day. , Disp: , Rfl:   ???  amitriptyline (ELAVIL) 50 MG tablet, TAKE 1 TABLET BY MOUTH EVERYDAY AT BEDTIME, Disp: , Rfl:   ???  aspirin (ECOTRIN) 81 MG tablet, Take 1 tablet (81 mg total) by mouth daily., Disp: 150 tablet, Rfl: 2  ???  atorvastatin (LIPITOR) 20 MG tablet, Take 1 tablet (20 mg total) by mouth daily., Disp: 30 tablet, Rfl: 11  ???  BELSOMRA 20 mg tablet, 20 mg daily., Disp: , Rfl:   ???  brexpiprazole (REXULTI) 0.5 mg Tab tablet, 0.5 mg daily., Disp: , Rfl:   ???  celecoxib (CELEBREX) 200 MG capsule, Take 200 mg by mouth daily., Disp: , Rfl:   ???  cyclobenzaprine (FLEXERIL) 5 MG tablet, Take 5 mg by mouth daily., Disp: , Rfl:   ???  dicyclomine (BENTYL) 20 mg tablet, Take 20 mg by mouth daily., Disp: , Rfl:   ???  doxycycline (MONODOX) 100 MG capsule, 100 mg daily., Disp: , Rfl:   ???  doxycycline (VIBRA-TABS) 100 MG tablet, Take 100 mg by mouth Two (2) times a day., Disp: , Rfl:   ???  esomeprazole (NEXIUM) 40 MG capsule, Take 40 mg by mouth daily., Disp: , Rfl:   ???  fluticasone propionate (FLONASE) 50 mcg/actuation nasal spray,  SPRAY 1 SPRAY INTO EACH NOSTRIL EVERY DAY, Disp: 16 mL, Rfl: 0  ???  folic acid (FOLVITE) 1 MG tablet, Take 1,000 mcg by mouth daily., Disp: , Rfl:   ???  ipratropium-albuterol (DUO-NEB) 0.5-2.5 mg/3 mL nebulizer, Inhale 3 mL by nebulization every six (6) hours as needed., Disp: 360 mL, Rfl: 11  ???  isosorbide mononitrate (IMDUR) 30 MG 24 hr tablet, Take 30 mg by mouth daily. , Disp: , Rfl:   ???  LATUDA 80 mg Tab, TAKE 1 TABLET BY MOUTH EVERY DAY, Disp: 30 tablet, Rfl: 0  ???  nitroglycerin (NITROSTAT) 0.4 MG SL tablet, Place 0.4 mg under the tongue daily as needed. , Disp: , Rfl:   ???  oxybutynin (DITROPAN-XL) 5 MG 24 hr tablet, TAKE 1 TABLET BY MOUTH EVERYDAY AT BEDTIME, Disp: , Rfl:   ???  PREMARIN 0.625 mg tablet, TAKE 1 TABLET (0.625 MG TOTAL) BY MOUTH DAILY., Disp: 30 tablet, Rfl: 0  ???  propranoloL (INDERAL) 40 MG tablet, Take 1 tablet by mouth daily., Disp: , Rfl:   ???  ruxolitinib (JAKAFI) 10 mg tablet, Take 1 tablet (10 mg total) by mouth Two (2) times a day., Disp: 60 tablet, Rfl: 1  ???  SPIRIVA WITH HANDIHALER 18 mcg inhalation capsule, INHALE 1 CAP INHALATION ONCE DAILY, Disp: , Rfl:   ???  SUMAtriptan (IMITREX) 50 MG tablet, Take 50 mg by mouth every two (2) hours as needed. , Disp: , Rfl:   ???  tamsulosin (FLOMAX) 0.4 mg capsule, Take 1 capsule (0.4 mg total) by mouth daily., Disp: 90 capsule, Rfl: 11  ???  TRINTELLIX 20 mg tablet, 20 mg daily., Disp: , Rfl:   ???  venlafaxine (EFFEXOR-XR) 150 MG 24 hr capsule, TAKE 2 CAPSULES BY MOUTH EVERY MORNING, Disp: , Rfl:   ???  vilazodone (VIIBRYD) 40 mg Tab, Take 1 tablet (40 mg total) by mouth Two (2) times a day., Disp: 60 tablet, Rfl: 0  ???  VYVANSE 70 mg capsule, Take 1 capsule by mouth daily. , Disp: , Rfl:     Allergies:  is allergic to lyrica [pregabalin], gabapentin, and tramadol.          Exam:  BSA: 1.61 meters squared  BP 96/71  - Pulse 94  - Temp 35.9 ??C (96.7 ??F) (Oral)  - Resp 16  - Ht 144.8 cm (4' 9)  - Wt 64.4 kg (141 lb 14.4 oz)  - SpO2 99%  - BMI 30.71 kg/m??   Pain Assessment: Pain scale 8 limited to the left breast    General Appearance:  No acute distress, well appearing and well nourished.   Head:  Normocephalic, atraumatic.   Eyes:  Conjuctiva and lids appear normal. Pupils equal and round,   sclera anicteric.   Ears:  Overall appearance normal with no scars, lesions or               masses.  Hearing is grossly normal.   Nose: Not examined secondary to face mask for covid   Throat: Not examined secondary to face mask for covid   Breast:  Breast exam reveals large heavy breasts with slightly inverted nipples bilaterally.  No skin change mass or nipple discharge bilaterally.  The patient did report pain to palpation in the left upper outer quadrant   Axilla  no adenopathy in the axilla bilaterally   Neck:  Neck is supple trachea midline no JVD is supraclavicular adenopathy   Pulmonary:    Normal respiratory effort  Cardiovascular:  Regular rate and rhythm per vital signs   Musculoskeletal: Normal gait.  Extremities without clubbing, cyanosis, or           edema.   Psychiatric: Judgement and insight appropriate.  Oriented to person,         place,  and time.       Diagnostic Studies:  @MAMMOFINDINGS @  Per HPI    Assessment:  Abnormal left mammogram    Breast pain    Plan:  Patient is assessed to be at average risk for the development of breast cancer.  Clinical exam is normal other than reports of pain to palpation.  Mammogram has a finding that she will undergo biopsy.  Based on her history of smoking I recommended to sensation and also her caffeine intake is moderately high with [facility] and I asked her to eliminate that that would certainly help with her breast pain.  She will be called with her biopsy result pathology and additional recommendations to follow.  Patient tells me that she is unable to take any type of NSAIDs.  So she will stay with extra strength Tylenol.  In addition could use a heating pad or ice.      The note was transcribed by dragon and may contain errors in spelling

## 2020-11-24 ENCOUNTER — Encounter: Admit: 2020-11-24 | Discharge: 2020-11-25 | Payer: MEDICARE

## 2020-11-24 DIAGNOSIS — R928 Other abnormal and inconclusive findings on diagnostic imaging of breast: Principal | ICD-10-CM

## 2020-12-05 NOTE — Unmapped (Signed)
Lennart Pall contacted the [employer] requesting results of the following:     Procedure: Mammogram/Biopsy  Completed On: 11/24/20  Program: Breast  Speciality: Surgery     Please contact LORISA SCHEID at [phone] for proper follow up.    Check Indicates criteria has been reviewed and confirmed with the patient:    [x]  Preferred Name   [x]  DOB and/or MR#  [x]  Preferred Contact Method  [x]  Phone Number(s)   []  MyChart     Thank you,   Drema Balzarine  [facility] Communication [facility]   [phone]

## 2020-12-05 NOTE — Unmapped (Signed)
Hi,     Courtney Erickson has contacted the Communication Center in regards to the following symptom:     States she is having worsening pain at the site of biopsy.     Please contact Likisha at [phone]     Check Indicates criteria has been reviewed and confirmed with the patient:    [x]  Preferred Name   [x]  DOB and/or MR#  [x]  Preferred Contact Method  [x]  Phone Number(s)   []  MyChart       Thank you,  Drema Balzarine   [facility] Communication [facility]   [phone]  .

## 2020-12-06 MED ORDER — OLANZAPINE 5 MG TABLET
0 days
Start: 2020-12-06 — End: ?

## 2020-12-06 NOTE — Unmapped (Signed)
[facility] Triage Note     Patient: Courtney Erickson     Reason for call:  return call    Time call returned: 1335     Phone Assessment: pt calling because she has recently had a biopsy done on her left breast (still waiting results) but the breast is still very painful and still has a discharge.   Pt states she cannot take NSAIDs and tylenol is not helping.         Triage Recommendations: RN recommended a heat compress for now and RN will forward concerns to surg onc for return call

## 2020-12-06 NOTE — Unmapped (Signed)
Hi,     Patient has contacted the Communication Center in regards to the following symptom:     Pain: worsening and Swelling in leg, arm, abdomen: worsening    Please contact patint at [phone].    Check Indicates criteria has been reviewed and confirmed with the patient:    [x]  Preferred Name   [x]  DOB and/or MR#  [x]  Preferred Contact Method  [x]  Phone Number(s)   [x]  MyChart     A page or telephone call has been made to the corresponding clinic.     Thank you,  [name]   [facility] Communication [facility]   [phone]

## 2020-12-07 NOTE — Unmapped (Signed)
The patient called yesterday with complaint of pain post core biopsy and that advil and tylenol not helping nurse recommended heat    I left VM on dedicated line that her path looked great all benign and mammography reviewed this and recommended a one year f/u     PRN with me endorsed heating pad and  Taking 2 extra strength tylenol every 8 hours.

## 2020-12-12 NOTE — Unmapped (Signed)
[facility] Clinical Assessment & Refill Coordination Note    Courtney Erickson, DOB: 11-28-1965  Phone: [phone] (home)     All above HIPAA information was verified with patient.     Was a Nurse, learning disability used for this call? No    Specialty Medication(s):   Hematology/Oncology: [id number]     Current Outpatient Medications   Medication Sig Dispense Refill   ??? ADVAIR DISKUS 250-50 mcg/dose diskus Inhale 1 puff 2 (two) times a day.      ??? amitriptyline (ELAVIL) 50 MG tablet TAKE 1 TABLET BY MOUTH EVERYDAY AT BEDTIME     ??? aspirin (ECOTRIN) 81 MG tablet Take 1 tablet (81 mg total) by mouth daily. 150 tablet 2   ??? atorvastatin (LIPITOR) 20 MG tablet Take 1 tablet (20 mg total) by mouth daily. 30 tablet 11   ??? BELSOMRA 20 mg tablet 20 mg daily.     ??? brexpiprazole (REXULTI) 0.5 mg Tab tablet 0.5 mg daily.     ??? cyclobenzaprine (FLEXERIL) 5 MG tablet Take 5 mg by mouth daily.     ??? dicyclomine (BENTYL) 20 mg tablet Take 20 mg by mouth daily.     ??? doxycycline (MONODOX) 100 MG capsule 100 mg daily.     ??? doxycycline (VIBRA-TABS) 100 MG tablet Take 1 tablet (100 mg total) by mouth Two (2) times a day. 20 tablet 0   ??? esomeprazole (NEXIUM) 40 MG capsule Take 40 mg by mouth daily.     ??? fluticasone propionate (FLONASE) 50 mcg/actuation nasal spray SPRAY 1 SPRAY INTO EACH NOSTRIL EVERY DAY 16 mL 0   ??? folic acid (FOLVITE) 1 MG tablet Take 1,000 mcg by mouth daily.     ??? ipratropium-albuterol (DUO-NEB) 0.5-2.5 mg/3 mL nebulizer Inhale 3 mL by nebulization every six (6) hours as needed. 360 mL 11   ??? isosorbide mononitrate (IMDUR) 30 MG 24 hr tablet Take 30 mg by mouth daily.      ??? LATUDA 80 mg Tab TAKE 1 TABLET BY MOUTH EVERY DAY 30 tablet 0   ??? nitroglycerin (NITROSTAT) 0.4 MG SL tablet Place 0.4 mg under the tongue daily as needed.      ??? oxybutynin (DITROPAN-XL) 5 MG 24 hr tablet TAKE 1 TABLET BY MOUTH EVERYDAY AT BEDTIME     ??? PREMARIN 0.625 mg tablet TAKE 1 TABLET (0.625 MG TOTAL) BY MOUTH DAILY. 30 tablet 0   ??? propranoloL (INDERAL) 40 MG tablet Take 1 tablet by mouth daily.     ??? ruxolitinib (JAKAFI) 10 mg tablet Take 1 tablet (10 mg total) by mouth Two (2) times a day. 60 tablet 1   ??? SPIRIVA WITH HANDIHALER 18 mcg inhalation capsule INHALE 1 CAP INHALATION ONCE DAILY     ??? SUMAtriptan (IMITREX) 50 MG tablet Take 50 mg by mouth every two (2) hours as needed.      ??? tamsulosin (FLOMAX) 0.4 mg capsule Take 1 capsule (0.4 mg total) by mouth daily. 90 capsule 11   ??? TRINTELLIX 20 mg tablet 20 mg daily.     ??? venlafaxine (EFFEXOR-XR) 150 MG 24 hr capsule TAKE 2 CAPSULES BY MOUTH EVERY MORNING     ??? vilazodone (VIIBRYD) 40 mg Tab Take 1 tablet (40 mg total) by mouth Two (2) times a day. 60 tablet 0   ??? VYVANSE 70 mg capsule Take 1 capsule by mouth daily.        No current facility-administered medications for this visit.  Changes to medications: Treyana reports no changes at this time.    Allergies   Allergen Reactions   ??? Lyrica [Pregabalin] Other (See Comments)     Unsteady gate, causing falls   ??? Gabapentin Nausea And Vomiting   ??? Tramadol Rash       Changes to allergies: No    SPECIALTY MEDICATION ADHERENCE     Jakafi 10 mg: 4-5 days of medicine on hand     Medication Adherence    Patient reported X missed doses in the last month: 0  Specialty Medication: Jakafi 10 mg bid  Patient is on additional specialty medications: No  Informant: patient  Confirmed plan for next specialty medication refill: delivery by pharmacy          Specialty medication(s) dose(s) confirmed: Regimen is correct and unchanged.     Are there any concerns with adherence? No    Adherence counseling provided? Not needed    CLINICAL MANAGEMENT AND INTERVENTION      Clinical Benefit Assessment:    Do you feel the medicine is effective or helping your condition? Yes    Clinical Benefit counseling provided? Not needed    Adverse Effects Assessment:    Are you experiencing any side effects? No    Are you experiencing difficulty administering your medicine? No    Quality of Life Assessment:    How many days over the past month did your Polycythemia vera  keep you from your normal activities? For example, brushing your teeth or getting up in the morning. 0    Have you discussed this with your provider? Not needed    Therapy Appropriateness:    Is therapy appropriate? Yes, therapy is appropriate and should be continued    DISEASE/MEDICATION-SPECIFIC INFORMATION      N/A    PATIENT SPECIFIC NEEDS     - Does the patient have any physical, cognitive, or cultural barriers? No    - Is the patient high risk? Yes, patient is taking oral chemotherapy. Appropriateness of therapy as been assessed    - Does the patient require a Care Management Plan? No     - Does the patient require physician intervention or other additional services (i.e. nutrition, smoking cessation, social work)? No      SHIPPING     Specialty Medication(s) to be Shipped:   Hematology/Oncology: Jakafi    Other medication(s) to be shipped: No additional medications requested for fill at this time     Changes to insurance: No    Delivery Scheduled: Yes, Expected medication delivery date: 12/15/20.     Medication will be delivered via Next Day Courier to the confirmed prescription address in [facility].    The patient will receive a drug information handout for each medication shipped and additional FDA Medication Guides as required.  Verified that patient has previously received a Conservation officer, historic buildings.    All of the patient's questions and concerns have been addressed.    [name] Shared [facility] Specialty Pharmacist

## 2020-12-14 ENCOUNTER — Encounter: Admit: 2020-12-14 | Discharge: 2020-12-14 | Payer: MEDICARE

## 2020-12-14 ENCOUNTER — Encounter: Admit: 2020-12-14 | Discharge: 2020-12-14 | Payer: MEDICARE | Attending: Pharmacist | Primary: Pharmacist

## 2020-12-14 ENCOUNTER — Encounter
Admit: 2020-12-14 | Discharge: 2020-12-14 | Payer: MEDICARE | Attending: Hematology & Oncology | Primary: Hematology & Oncology

## 2020-12-14 DIAGNOSIS — J449 Chronic obstructive pulmonary disease, unspecified: Principal | ICD-10-CM

## 2020-12-14 DIAGNOSIS — D45 Polycythemia vera: Principal | ICD-10-CM

## 2020-12-14 LAB — COMPREHENSIVE METABOLIC PANEL
ALBUMIN: 4 g/dL (ref 3.4–5.0)
ALKALINE PHOSPHATASE: 72 U/L (ref 46–116)
ALT (SGPT): 46 U/L (ref 10–49)
ANION GAP: 6 mmol/L (ref 5–14)
AST (SGOT): 62 U/L — ABNORMAL HIGH (ref <=34)
BILIRUBIN TOTAL: 1.3 mg/dL — ABNORMAL HIGH (ref 0.3–1.2)
BLOOD UREA NITROGEN: 11 mg/dL (ref 9–23)
BUN / CREAT RATIO: 16
CALCIUM: 9 mg/dL (ref 8.7–10.4)
CHLORIDE: 104 mmol/L (ref 98–107)
CO2: 27 mmol/L (ref 20.0–31.0)
CREATININE: 0.69 mg/dL
EGFR CKD-EPI AA FEMALE: >90 mL/min/{1.73_m2} (ref >=60)
EGFR CKD-EPI NON-AA FEMALE: >90 mL/min/{1.73_m2} (ref >=60)
GLUCOSE RANDOM: 102 mg/dL (ref 70–179)
POTASSIUM: 4 mmol/L (ref 3.4–4.5)
PROTEIN TOTAL: 6.8 g/dL (ref 5.7–8.2)
SODIUM: 137 mmol/L (ref 135–145)

## 2020-12-14 LAB — CBC W/ AUTO DIFF
BASOPHILS ABSOLUTE COUNT: 0 10*9/L (ref 0.0–0.1)
BASOPHILS RELATIVE PERCENT: 0.4 %
EOSINOPHILS ABSOLUTE COUNT: 0.1 10*9/L (ref 0.0–0.4)
EOSINOPHILS RELATIVE PERCENT: 1.5 %
HEMATOCRIT: 36.7 % (ref 36.0–46.0)
HEMOGLOBIN: 12 g/dL (ref 12.0–16.0)
LARGE UNSTAINED CELLS: 4 % (ref 0–4)
LYMPHOCYTES ABSOLUTE COUNT: 2.4 10*9/L (ref 1.5–5.0)
LYMPHOCYTES RELATIVE PERCENT: 36.5 %
MEAN CORPUSCULAR HEMOGLOBIN CONC: 32.7 g/dL (ref 31.0–37.0)
MEAN CORPUSCULAR HEMOGLOBIN: 36 pg — ABNORMAL HIGH (ref 26.0–34.0)
MEAN CORPUSCULAR VOLUME: 109.9 fL — ABNORMAL HIGH (ref 80.0–100.0)
MEAN PLATELET VOLUME: 9.1 fL (ref 7.0–10.0)
MONOCYTES ABSOLUTE COUNT: 0.3 10*9/L (ref 0.2–0.8)
MONOCYTES RELATIVE PERCENT: 4.9 %
NEUTROPHILS ABSOLUTE COUNT: 3.4 10*9/L (ref 2.0–7.5)
NEUTROPHILS RELATIVE PERCENT: 52.5 %
PLATELET COUNT: 461 10*9/L — ABNORMAL HIGH (ref 150–440)
RED BLOOD CELL COUNT: 3.34 10*12/L — ABNORMAL LOW (ref 4.00–5.20)
RED CELL DISTRIBUTION WIDTH: 13.6 % (ref 12.0–15.0)
WBC ADJUSTED: 6.5 10*9/L (ref 4.5–11.0)

## 2020-12-14 LAB — LACTATE DEHYDROGENASE: LACTATE DEHYDROGENASE: 184 U/L (ref 120–246)

## 2020-12-14 LAB — SLIDE REVIEW

## 2020-12-14 MED ORDER — RUXOLITINIB 15 MG TABLET
ORAL_TABLET | Freq: Two times a day (BID) | ORAL | 5 refills | 30 days | Status: CP
Start: 2020-12-14 — End: 2021-01-13
  Filled 2020-12-18: qty 60, 30d supply, fill #0

## 2020-12-14 MED ORDER — PREDNISONE 10 MG TABLET
ORAL_TABLET | Freq: Every day | ORAL | 0 refills | 5.00000 days | Status: CP
Start: 2020-12-14 — End: ?

## 2020-12-14 MED ORDER — LEVOFLOXACIN 750 MG TABLET
ORAL_TABLET | Freq: Every day | ORAL | 0 refills | 7 days | Status: CP
Start: 2020-12-14 — End: 2020-12-19

## 2020-12-14 MED FILL — JAKAFI 10 MG TABLET: ORAL | 30 days supply | Qty: 60 | Fill #1

## 2020-12-14 NOTE — Unmapped (Signed)
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 This treatment controlled her counts, but did not relieve many of the associated MPN symptoms.  For this reason, she was started on ruxolitinib.  At 10 mg BID, her counts are reasonably well controlled.  Her platelet count is slightly over 450, which does not have any significant clinical implications.  I do not think that increasing the dose will improve her sx.  We should check her ferritin to make sure the increase in her platelets is not tied to a relative iron deficiency.     The goal is to improve her functional status so that we can consider a transition to interferon.  Her biggest risk factor for IFN is her depression.  IFN can be effective a low doses so I feel this can be done if she has careful surveillance.  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  Nevertheless, it is associated with a higher risk of progression and transformation.  I will discuss this with her at her next visit.     Low dose aspirin is reasonable given her cardiovascular risk factors. Given her h/o of vWD and her controlled platelet count, I would stay with once a day.  Of note, her last vWF testing was normal.  This is not unheard of in vWD.      She appears to be suffering from a COPD exacerbation.  This may have been triggered by an environmental exposure.  Nevertheless, I feel it is reasonable to treat it.     PLAN:  1) Continue Ruxolitinib 10 mg BID.    2) Discuss transitioning to interferon.  3) COPD Exacerbation:   ?? Prednisone 40 mg a day for 5 days  ?? Levaquin 750 mg x 5 days      4) FU on CT scan for lung cancer screening (12/16/20)    RISK ASSESSMENT:  ?? 15.8/14/596  ?? No h/o thrombosis  ?? 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 10 yr overall survival: 94%; AML: 3%; MF: 5%    ?? 20 yr overall survival: 66%; AML: 20%; MF: 18%    PCV Thrombosis Risk  Age > 60:    0  History of thrombosis:  0  Low Risk: 0 points (Every day aspirin unless presence of other high risk factors)    TX HX:  ?? 2013: Hydrea  ?? 2022: Switched to ruxolitinib 10 mg BID due to MPN sx     HEME HX  06/2012: Dx with PCV  ?? Possible splenomegaly  ?? Started on phlebotomy  ?? 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  She is tolerating this well.  Her symptom score is given below.  We discussed the following:   ?? Depression:  Overall, she feels less depressed.   ?? COPD: She notes worsening cough with sputum production and worsening SOB; these sx have developed over the past 24 hours. It feels similar to other COPD exacerbations.  These occur 2 to 3 x a year.  Her last hospitalization was in 2007.  She does respond to steroids  ?? Allergy: She does note some worsening allergy sx.  She had a lot of dust exposure in the past few days.  She has been on Claritin and Zyrtec.  Her sneezing has been getting better.   ?? Breast:  She was seen by the breast clinic.  She is no longer having DC.  She does note some pain at the biopsy site.    ?? Flank pain: She notes pain over her flank; she denies UT sx.     MPN 10 Score  (App: [email or web address]) [email or web address]  (Useful for monitoring patient symptoms.    Symptom Range 7/21 1/22 2/17    Fatigue in the past 24 hours (Absent) 0 1 2 3 4 5 6 7 8 9  10 (Worst Imaginable) 10 10 10+   Filling up quickly when you eat (Early satiety)  (Absent) 0 1 2 3 4 5 6 7 8 9  10 (Worst Imaginable)      Abdominal discomfort  (Absent) 0 1 2 3 4 5 6 7 8 9  10 (Worst Imaginable) 0 0 0   Inactivity (Absent) 0 1 2 3 4 5 6 7 8 9  10 (Worst Imaginable) 8 8 8    Problems with concentration - (Absent) 0 1 2 3 4 5 6 7 8 9  10 (Worst Imaginable) 5 8 5    Numbness/ Tingling (in my hands and feet) (Absent) 0 1 2 3 4 5 6 7 8 9  10 (Worst Imaginable) ?  Drops objects on most days  0    Night sweats (Absent) 0 1 2 3 4 5 6 7 8 9  10 (Worst Imaginable) 8 8 0   Itching (pruritus) (Absent) 0 1 2 3 4 5 6 7 8 9  10 (Worst Imaginable) 4  Night itch  aquagenic pruritus;  can't sleep  4 0  Sleep is better    Bone pain (diffuse not joint pain or arthritis) (Absent) 0 1 2 3 4 5 6 7 8 9  10 (Worst Imaginable) 3 Severe but infreq  2   0   Fever (>100 F) (Absent) 0 1 2 3 4 5 6 7 8 9  10 (Daily) 0 0 0   Unintentional weight loss last 6 months (Absent) 0 1 2 3 4 5 6 7 8 9  10 (Worst Imaginable) 0 0 0   Total  38 40 23     PHYSICAL EXAM:  VS: As recorded above  GENERAL: No acute distress  HEENT: Plates;   LYMPH NODES: No significant LAN  NECK: NO JVD  LUNGS: Wheezing noted throughout; no consolidation; extended exp phase; no acc muscle use  ZOX:WRUE S1, S2  ABD: NTND; No HSM  EXT:No edema    PMHx:   ?? 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 H/O bleeding with childbirth, dental extractions, menstrual bleeding  ?? VWF measured as low as 20% and as high as 84%  ?? Responds to DDAVP  ?? Txed with Humate P75 U/kg BID for 5 days  ?? Lung cancer screening   ?? 12/19/17: Pulmonary nodules:  Stable, Lung-RADS Category 2.  ?? 01/18/19: LD CT Chest: Few unchanged subcentimeter pulmonary nodules. No new nodule.   Lung-RADS: 2  ?? Severe cervical spondylosis (9/20)  ?? Colonoscopy (2016): single polyp  ?? Bladder disease: atony, chronic interstitial cystitis, with implanted bladder stimulator  ?? S/P hysterectomy for endometriosis  ?? Psychiatric disease, stated history of major depression; h/o Abilify, Wellbutrin, BuSpar, Adderall, Valium; on Latuda, gabapentin, Topamax, Viibryd, Abilify (txed by [facility]  ?? GERD, gastric ulcer, epigastric pain, with negative EGD and EUS for pancreatic disease (8/20)  ?? Mammogram (01/2017): BI-RADS 2, 1 year follow-up recommended/scheduled  ?? Nl Cardiac Cath (9/17)   ?? COPD;????smoker (30 pk yr), on Advair and DuoNeb;   ?? PFTs (12/15)  ?? FVC was 2.27 liters, 79% of predicted  FEV1 was 1.86, 78% of predicted  FEV1 ratio was 82  FEF 25-75% liters per second was 88% of predicted    LUNG VOLUMES:  TLC was 71% of predicted  RV was 55% of predicted    DIFFUSION CAPACITY:  DLCO was 63% of predicted  DLCO/VA was 123% of predicted    FLOW VOLUME LOOP:  ? Expiratory flow volume is somewhat flattened   ??

## 2020-12-14 NOTE — Unmapped (Signed)
Hi,     Courtney Erickson w/ [facility] contacted the Communication Center regarding the following:    - Clarification needed for Levoflozacin-directions is to take for 5 days but dispense amt is for 7     Please contact Courtney Erickson at [phone].    Thanks in advance,    [name]  [facility]   [phone]

## 2020-12-14 NOTE — Unmapped (Addendum)
PLAN  1) Polycythemia Vera: The Courtney Erickson is working well to control your counts and symptoms.  I have put a script for 15 mg tablets twice a day. The next time we will talk about long term issues with these medications. There is an alternative - that's interferon.      2) COPD: 40 mg of prednisone once a daty for 5 days and levaquin once a day for 5 days.       A: Breast, left, core biopsy  -Benign breast tissue with stromal fibrosis, microcysts, and microcalcifications   -Benign: Not cancer   - Stromal fibrosis: Scar tissue   - Microcysts: These common     It does not explain drainage - this was likely due to an infection in the breast.      Courtney Erickson: It decreases of the side effects of this condition: Bone pain, Night sweats and itching.     All lab results last 24 hours:    Recent Results (from the past 24 hour(s))   Comprehensive Metabolic Panel    Collection Time: 12/14/20 10:17 AM   Result Value Ref Range    Sodium 137 135 - 145 mmol/L    Potassium 4.0 3.4 - 4.5 mmol/L    Chloride 104 98 - 107 mmol/L    Anion Gap 6 5 - 14 mmol/L    CO2 27.0 20.0 - 31.0 mmol/L    BUN 11 9 - 23 mg/dL    Creatinine 1.61 Kidneys are normal 0.60 - 0.80 mg/dL    BUN/Creatinine Ratio 16     EGFR CKD-EPI Non-African American, Female >90 >=60 mL/min/1.58m2    EGFR CKD-EPI African American, Female >90 >=60 mL/min/1.70m2    Glucose 102 70 - 179 mg/dL    Calcium 9.0 8.7 - 09.6 mg/dL    Albumin 4.0 3.4 - 5.0 g/dL    Total Protein 6.8 5.7 - 8.2 g/dL    Total Bilirubin 1.3 (H) 0.3 - 1.2 mg/dL    AST 62 (H) This is likely due to medication.  This is concerning IF it > 340 <=34 U/L    ALT 46 10 - 49 U/L    Alkaline Phosphatase 72 46 - 116 U/L   Lactate dehydrogenase    Collection Time: 12/14/20 10:17 AM   Result Value Ref Range    LDH 184 120 - 246 U/L   CBC w/ Differential    Collection Time: 12/14/20 10:17 AM   Result Value Ref Range    WBC 6.5 4.5 - 11.0 10*9/L    RBC 3.34 (L) 4.00 - 5.20 10*12/L    HGB 12.0 This is normal 12.0 - 16.0 g/dL HCT 04.5 40.9 - 81.1 %    MCV 109.9 (H) 80.0 - 100.0 fL    MCH 36.0 (H) 26.0 - 34.0 pg    MCHC 32.7 31.0 - 37.0 g/dL    RDW 91.4 78.2 - 95.6 %    MPV 9.1 7.0 - 10.0 fL    Platelet 461 (H) This is slightly higher -  150 - 440 10*9/L    Neutrophils % 52.5 %    Lymphocytes % 36.5 %    Monocytes % 4.9 %    Eosinophils % 1.5 %    Basophils % 0.4 %    Absolute Neutrophils 3.4 2.0 - 7.5 10*9/L    Absolute Lymphocytes 2.4 1.5 - 5.0 10*9/L    Absolute Monocytes 0.3 0.2 - 0.8 10*9/L    Absolute Eosinophils 0.1 0.0 - 0.4 10*9/L  Absolute Basophils 0.0 0.0 - 0.1 10*9/L    Large Unstained Cells 4 0 - 4 %    Macrocytosis Marked (A) Not Present

## 2020-12-14 NOTE — Unmapped (Unsigned)
Labs drawn and sent for analysis. Care provided by P.[facility] LPN

## 2020-12-14 NOTE — Unmapped (Signed)
Courtney Erickson is a 55 y.o. female with MPN (PCV) who I am seeing in clinic today for medication management    Encounter Date: 12/14/2020    Current Treatment: Jakafi 10 mg BID    For oral chemotherapy:  Pharmacy: [facility]   Medication Access: $0/month with grant    Interval History: Courtney Erickson reports better symptom control after initiation of Jakafi. Specifically, she has gained greater control over her balance, fall risk, and fatigue. Continues to have pain. She also continues to struggle with COPD management (reported using duoneb every 4 hours), chest pain (goes away after laying down for a while), migraines (pretreated with sumatriptan), insomnia (only 1-2 hours of sleep per day) and bladder over activation. Unclear if these issues are related to MPN, comorbid conditions, or polypharmacy.  Of note, she reported no longer taking doxycycline, nitrostat, and celebrex.       Most recent labs include Hgb 12.0, PLT 461, and ANC 3.4. CMP is stable with slight AST elevation (62) and Tbili (1.3). BP 109/71, HR 84.      Oncologic History:  Oncology History Overview Note   -- Patient with a reported history of polycythemia vera, treated with phlebotomy and Hydrea. Reportedly had splenomegaly, but CT scan of abdomen and pelvis from 09/06/13 and again on 05/03/2014, at [facility] stated normal spleen and liver. There is report of mild thickening of the wall of the colon, suggestive of chronic inflammation. The patient had a normal chest CT on 11/09/14 with the exception of mild dilatation of the pulmonary trunk.     Polycythemia vera (CMS-HCC)   07/02/2012 Initial Diagnosis    Polycythemia vera (RAF-HCC)         Weight and Vitals:  Wt Readings from Last 3 Encounters:   12/14/20 67.1 kg (148 lb)   11/22/20 64.4 kg (141 lb 14.4 oz)   11/09/20 64.9 kg (143 lb)     Temp Readings from Last 3 Encounters:   12/14/20 36.7 ??C (98 ??F) (Oral)   11/22/20 35.9 ??C (96.7 ??F) (Oral)   11/09/20 36.6 ??C (97.8 ??F) (Tympanic)     BP Readings from Last 3 Encounters:   12/14/20 109/71   11/22/20 96/71   11/09/20 94/66     Pulse Readings from Last 3 Encounters:   12/14/20 84   11/22/20 94   11/09/20 81       Pertinent Labs:  Appointment on 12/14/2020   Component Date Value Ref Range Status   ??? Sodium 12/14/2020 137  135 - 145 mmol/L Final   ??? Potassium 12/14/2020 4.0  3.4 - 4.5 mmol/L Final   ??? Chloride 12/14/2020 104  98 - 107 mmol/L Final   ??? Anion Gap 12/14/2020 6  5 - 14 mmol/L Final   ??? CO2 12/14/2020 27.0  20.0 - 31.0 mmol/L Final   ??? BUN 12/14/2020 11  9 - 23 mg/dL Final   ??? Creatinine 12/14/2020 0.69  0.60 - 0.80 mg/dL Final   ??? BUN/Creatinine Ratio 12/14/2020 16   Final   ??? EGFR CKD-EPI Non-African American,* 12/14/2020 >90  >=60 mL/min/1.84m2 Final   ??? EGFR CKD-EPI African American, Fem* 12/14/2020 >90  >=60 mL/min/1.75m2 Final   ??? Glucose 12/14/2020 102  70 - 179 mg/dL Final   ??? Calcium 45/40/9811 9.0  8.7 - 10.4 mg/dL Final   ??? Albumin 91/47/8295 4.0  3.4 - 5.0 g/dL Final   ??? Total Protein 12/14/2020 6.8  5.7 - 8.2 g/dL Final   ???  Total Bilirubin 12/14/2020 1.3* 0.3 - 1.2 mg/dL Final   ??? AST 16/07/9603 62* <=34 U/L Final   ??? ALT 12/14/2020 46  10 - 49 U/L Final   ??? Alkaline Phosphatase 12/14/2020 72  46 - 116 U/L Final   ??? LDH 12/14/2020 184  120 - 246 U/L Final   ??? WBC 12/14/2020 6.5  4.5 - 11.0 10*9/L Final   ??? RBC 12/14/2020 3.34* 4.00 - 5.20 10*12/L Final   ??? HGB 12/14/2020 12.0  12.0 - 16.0 g/dL Final   ??? HCT 54/06/8118 36.7  36.0 - 46.0 % Final   ??? MCV 12/14/2020 109.9* 80.0 - 100.0 fL Final   ??? MCH 12/14/2020 36.0* 26.0 - 34.0 pg Final   ??? MCHC 12/14/2020 32.7  31.0 - 37.0 g/dL Final   ??? RDW 14/78/2956 13.6  12.0 - 15.0 % Final   ??? MPV 12/14/2020 9.1  7.0 - 10.0 fL Final   ??? Platelet 12/14/2020 461* 150 - 440 10*9/L Final   ??? Neutrophils % 12/14/2020 52.5  % Final   ??? Lymphocytes % 12/14/2020 36.5  % Final   ??? Monocytes % 12/14/2020 4.9  % Final   ??? Eosinophils % 12/14/2020 1.5  % Final   ??? Basophils % 12/14/2020 0.4  % Final   ??? Absolute Neutrophils 12/14/2020 3.4  2.0 - 7.5 10*9/L Final   ??? Absolute Lymphocytes 12/14/2020 2.4  1.5 - 5.0 10*9/L Final   ??? Absolute Monocytes 12/14/2020 0.3  0.2 - 0.8 10*9/L Final   ??? Absolute Eosinophils 12/14/2020 0.1  0.0 - 0.4 10*9/L Final   ??? Absolute Basophils 12/14/2020 0.0  0.0 - 0.1 10*9/L Final   ??? Large Unstained Cells 12/14/2020 4  0 - 4 % Final   ??? Macrocytosis 12/14/2020 Marked* Not Present Final       Allergies:   Allergies   Allergen Reactions   ??? Lyrica [Pregabalin] Other (See Comments)     Unsteady gate, causing falls   ??? Gabapentin Nausea And Vomiting   ??? Tramadol Rash       Drug Interactions: No notable DDI with jakafi however, has polypharmacy with numerous other meds. Monitor for serotonin syndrome, anticholinergic effects, EPS toxicities, and BP/HR lowering.       Current Medications:  Current Outpatient Medications   Medication Sig Dispense Refill   ??? ADVAIR DISKUS 250-50 mcg/dose diskus Inhale 1 puff 2 (two) times a day.      ??? amitriptyline (ELAVIL) 50 MG tablet TAKE 1 TABLET BY MOUTH EVERYDAY AT BEDTIME     ??? aspirin (ECOTRIN) 81 MG tablet Take 1 tablet (81 mg total) by mouth daily. 150 tablet 2   ??? atorvastatin (LIPITOR) 20 MG tablet Take 1 tablet (20 mg total) by mouth daily. 30 tablet 11   ??? BELSOMRA 20 mg tablet 20 mg daily.     ??? brexpiprazole (REXULTI) 0.5 mg Tab tablet 0.5 mg daily.     ??? cyclobenzaprine (FLEXERIL) 5 MG tablet Take 5 mg by mouth daily.     ??? dicyclomine (BENTYL) 20 mg tablet Take 20 mg by mouth daily.     ??? esomeprazole (NEXIUM) 40 MG capsule Take 40 mg by mouth daily.     ??? fluticasone propionate (FLONASE) 50 mcg/actuation nasal spray SPRAY 1 SPRAY INTO EACH NOSTRIL EVERY DAY 16 mL 0   ??? folic acid (FOLVITE) 1 MG tablet Take 1,000 mcg by mouth daily.     ??? ipratropium-albuterol (DUO-NEB) 0.5-2.5 mg/3 mL nebulizer Inhale 3  mL by nebulization every six (6) hours as needed. 360 mL 11   ??? isosorbide mononitrate (IMDUR) 30 MG 24 hr tablet Take 30 mg by mouth daily.      ??? LATUDA 80 mg Tab TAKE 1 TABLET BY MOUTH EVERY DAY 30 tablet 0   ??? levoFLOXacin (LEVAQUIN) 750 MG tablet Take 1 tablet (750 mg total) by mouth daily for 5 days. 7 tablet 0   ??? OLANZapine (ZYPREXA) 5 MG tablet TAKE 2 TABS BY MOUTH AT BEDTIME FOR 1 WEEK, THEN 1 TAB BY MOUTH AT BEDTIME FOR 1 WEEK, THEN STOP     ??? predniSONE (DELTASONE) 10 MG tablet Take 4 tablets (40 mg total) by mouth daily. 20 tablet 0   ??? propranoloL (INDERAL) 40 MG tablet Take 1 tablet by mouth daily.     ??? ruxolitinib (JAKAFI) 15 mg tablet Take 1 tablet (15 mg total) by mouth Two (2) times a day. 60 tablet 5   ??? SPIRIVA WITH HANDIHALER 18 mcg inhalation capsule INHALE 1 CAP INHALATION ONCE DAILY     ??? SUMAtriptan (IMITREX) 50 MG tablet Take 50 mg by mouth every two (2) hours as needed.      ??? tamsulosin (FLOMAX) 0.4 mg capsule Take 1 capsule (0.4 mg total) by mouth daily. 90 capsule 11   ??? TRINTELLIX 20 mg tablet 20 mg daily.     ??? venlafaxine (EFFEXOR-XR) 150 MG 24 hr capsule TAKE 2 CAPSULES BY MOUTH EVERY MORNING     ??? VYVANSE 70 mg capsule Take 1 capsule by mouth daily.        No current facility-administered medications for this visit.       Adherence: No issues identified        Assessment: CourtneyErickson is a 55 y.o. female with MPN (PCV) who is now increasing her Jakafi dose from 10 mg BID to 15 mg BID.     Plan:   MPN:   - INCREASE Jakafi to 15 mg BID. Rx sent to pharmacy.   - (The 10 mg tablets were planned to deliver tomorrow. She can either refuse them and will get the 15 mg tablets soon instead, or accept them but have to hang onto them for future.)  - Continue aspirin daily    COPD exacerbation:  - START prednisone 40 mg daily  - START levofloxacin 750 mg daily for 5 days    - She should RTC on 5/26 as scheduled for labs and MD visit.     F/u:  Future Appointments   Date Time Provider Department Center   12/16/2020 10:00 AM [facility] CT RM 1 [facility] [place] [facility]   01/12/2021  9:45 AM Vickii Penna, MD [facility]   03/22/2021 11:30 AM ADULT ONC LAB [facility]   03/22/2021 12:40 PM Halford Decamp, MD [id number]       I spent 25 minutes with Courtney Erickson in direct patient care.      Manfred Arch, PharmD, BCOP, CPP  Pager: [phone]

## 2020-12-14 NOTE — Unmapped (Signed)
AOC Triage Note     Patient: Courtney Erickson     Reason for call:  Medication clarification    Time call returned: 1140     Phone Assessment: Returned [facility] and spoke to [place] to clarify Levoflozacin-directions is to take for 5 days but dispense amount is for 5 per provider.          Patient Response: VORB    Patient Pharmacy has been verified and primary pharmacy has been marked as preferred

## 2020-12-15 NOTE — Unmapped (Signed)
Clinical Assessment Needed For: Dose Change  Medication: Jakafi 15mg  tablet  Last Fill Date/Day Supply: 12/14/20 / 30 days  Copay $0  Was previous dose already scheduled to fill: No    Notes to Pharmacist:

## 2020-12-15 NOTE — Unmapped (Signed)
[facility] Clinical Assessment & Refill Coordination Note    Courtney Erickson, DOB: Mar 13, 1966  Phone: [phone] (home)     All above HIPAA information was verified with patient.     Was a Nurse, learning disability used for this call? No    Specialty Medication(s):   Hematology/Oncology: [id number]     Current Outpatient Medications   Medication Sig Dispense Refill   ??? ADVAIR DISKUS 250-50 mcg/dose diskus Inhale 1 puff 2 (two) times a day.      ??? amitriptyline (ELAVIL) 50 MG tablet TAKE 1 TABLET BY MOUTH EVERYDAY AT BEDTIME     ??? aspirin (ECOTRIN) 81 MG tablet Take 1 tablet (81 mg total) by mouth daily. 150 tablet 2   ??? atorvastatin (LIPITOR) 20 MG tablet Take 1 tablet (20 mg total) by mouth daily. 30 tablet 11   ??? BELSOMRA 20 mg tablet 20 mg daily.     ??? brexpiprazole (REXULTI) 0.5 mg Tab tablet 0.5 mg daily.     ??? cyclobenzaprine (FLEXERIL) 5 MG tablet Take 5 mg by mouth daily.     ??? dicyclomine (BENTYL) 20 mg tablet Take 20 mg by mouth daily.     ??? esomeprazole (NEXIUM) 40 MG capsule Take 40 mg by mouth daily.     ??? fluticasone propionate (FLONASE) 50 mcg/actuation nasal spray SPRAY 1 SPRAY INTO EACH NOSTRIL EVERY DAY 16 mL 0   ??? folic acid (FOLVITE) 1 MG tablet Take 1,000 mcg by mouth daily.     ??? ipratropium-albuterol (DUO-NEB) 0.5-2.5 mg/3 mL nebulizer Inhale 3 mL by nebulization every six (6) hours as needed. 360 mL 11   ??? isosorbide mononitrate (IMDUR) 30 MG 24 hr tablet Take 30 mg by mouth daily.      ??? LATUDA 80 mg Tab TAKE 1 TABLET BY MOUTH EVERY DAY 30 tablet 0   ??? levoFLOXacin (LEVAQUIN) 750 MG tablet Take 1 tablet (750 mg total) by mouth daily for 5 days. 7 tablet 0   ??? OLANZapine (ZYPREXA) 5 MG tablet TAKE 2 TABS BY MOUTH AT BEDTIME FOR 1 WEEK, THEN 1 TAB BY MOUTH AT BEDTIME FOR 1 WEEK, THEN STOP     ??? predniSONE (DELTASONE) 10 MG tablet Take 4 tablets (40 mg total) by mouth daily. 20 tablet 0   ??? propranoloL (INDERAL) 40 MG tablet Take 1 tablet by mouth daily.     ??? ruxolitinib (JAKAFI) 15 mg tablet Take 1 tablet (15 mg total) by mouth Two (2) times a day. 60 tablet 5   ??? SPIRIVA WITH HANDIHALER 18 mcg inhalation capsule INHALE 1 CAP INHALATION ONCE DAILY     ??? SUMAtriptan (IMITREX) 50 MG tablet Take 50 mg by mouth every two (2) hours as needed.      ??? tamsulosin (FLOMAX) 0.4 mg capsule Take 1 capsule (0.4 mg total) by mouth daily. 90 capsule 11   ??? TRINTELLIX 20 mg tablet 20 mg daily.     ??? venlafaxine (EFFEXOR-XR) 150 MG 24 hr capsule TAKE 2 CAPSULES BY MOUTH EVERY MORNING     ??? VYVANSE 70 mg capsule Take 1 capsule by mouth daily.        No current facility-administered medications for this visit.        Changes to medications: Bryanda reports no changes at this time.    Allergies   Allergen Reactions   ??? Lyrica [Pregabalin] Other (See Comments)     Unsteady gate, causing falls   ??? Gabapentin Nausea And Vomiting   ???  Tramadol Rash       Changes to allergies: No    SPECIALTY MEDICATION ADHERENCE     [name]: 0 days of medicine on hand       Medication Adherence    Specialty Medication: [id number]   Patient is on additional specialty medications: No  Informant: patient  Confirmed plan for next specialty medication refill: delivery by pharmacy  Refills needed for supportive medications: not needed          Specialty medication(s) dose(s) confirmed: Patient reports changes to the regimen as follows: increase in dose to 15mg  twice daily     Are there any concerns with adherence? No    Adherence counseling provided? Not needed    CLINICAL MANAGEMENT AND INTERVENTION      Clinical Benefit Assessment:    Do you feel the medicine is effective or helping your condition? Yes    Clinical Benefit counseling provided? Not needed    Adverse Effects Assessment:    Are you experiencing any side effects? No    Are you experiencing difficulty administering your medicine? No    Quality of Life Assessment:    How many days over the past month did your condition  keep you from your normal activities? For example, brushing your teeth or getting up in the morning. 0    Have you discussed this with your provider? Not needed    Therapy Appropriateness:    Is therapy appropriate? Yes, therapy is appropriate and should be continued    DISEASE/MEDICATION-SPECIFIC INFORMATION      N/A    PATIENT SPECIFIC NEEDS     - Does the patient have any physical, cognitive, or cultural barriers? No    - Is the patient high risk? Yes, patient is taking oral chemotherapy. Appropriateness of therapy as been assessed    - Does the patient require a Care Management Plan? No     - Does the patient require physician intervention or other additional services (i.e. nutrition, smoking cessation, social work)? No      SHIPPING     Specialty Medication(s) to be Shipped:   Hematology/Oncology: Jakafi    Other medication(s) to be shipped: No additional medications requested for fill at this time     Changes to insurance: No    Delivery Scheduled: Yes, Expected medication delivery date: 12/18/20.     Medication will be delivered via Same Day Courier to the confirmed prescription address in [facility].    The patient will receive a drug information handout for each medication shipped and additional FDA Medication Guides as required.  Verified that patient has previously received a Conservation officer, historic buildings.    All of the patient's questions and concerns have been addressed.    Hriday Stai Vangie Bicker   [facility] Specialty Pharmacist

## 2020-12-16 ENCOUNTER — Ambulatory Visit: Admit: 2020-12-16 | Discharge: 2020-12-17 | Payer: MEDICARE

## 2021-01-05 NOTE — Unmapped (Signed)
[facility] Clinical Assessment & Refill Coordination Note    Courtney Erickson, Courtney Erickson  Phone: [phone] (home)     All above HIPAA information was verified with patient.     Was a Nurse, learning disability used for this call? No    Specialty Medication(s):   Hematology/Oncology: VHQION     Current Outpatient Medications   Medication Sig Dispense Refill   ??? ADVAIR DISKUS 250-50 mcg/dose diskus Inhale 1 puff 2 (two) times a day.      ??? amitriptyline (ELAVIL) 50 MG tablet TAKE 1 TABLET BY MOUTH EVERYDAY AT BEDTIME     ??? aspirin (ECOTRIN) 81 MG tablet Take 1 tablet (81 mg total) by mouth daily. 150 tablet 2   ??? atorvastatin (LIPITOR) 20 MG tablet Take 1 tablet (20 mg total) by mouth daily. 30 tablet 11   ??? BELSOMRA 20 mg tablet 20 mg daily.     ??? brexpiprazole (REXULTI) 0.5 mg Tab tablet 0.5 mg daily.     ??? cyclobenzaprine (FLEXERIL) 5 MG tablet Take 5 mg by mouth daily.     ??? dicyclomine (BENTYL) 20 mg tablet Take 20 mg by mouth daily.     ??? esomeprazole (NEXIUM) 40 MG capsule Take 40 mg by mouth daily.     ??? fluticasone propionate (FLONASE) 50 mcg/actuation nasal spray SPRAY 1 SPRAY INTO EACH NOSTRIL EVERY DAY 16 mL 0   ??? folic acid (FOLVITE) 1 MG tablet Take 1,000 mcg by mouth daily.     ??? ipratropium-albuterol (DUO-NEB) 0.5-2.5 mg/3 mL nebulizer Inhale 3 mL by nebulization every six (6) hours as needed. 360 mL 11   ??? isosorbide mononitrate (IMDUR) 30 MG 24 hr tablet Take 30 mg by mouth daily.      ??? LATUDA 80 mg Tab TAKE 1 TABLET BY MOUTH EVERY DAY 30 tablet 0   ??? OLANZapine (ZYPREXA) 5 MG tablet TAKE 2 TABS BY MOUTH AT BEDTIME FOR 1 WEEK, THEN 1 TAB BY MOUTH AT BEDTIME FOR 1 WEEK, THEN STOP     ??? predniSONE (DELTASONE) 10 MG tablet Take 4 tablets (40 mg total) by mouth daily. 20 tablet 0   ??? propranoloL (INDERAL) 40 MG tablet Take 1 tablet by mouth daily.     ??? ruxolitinib (JAKAFI) 15 mg tablet Take 1 tablet (15 mg total) by mouth Two (2) times a day. 60 tablet 5   ??? SPIRIVA WITH HANDIHALER 18 mcg inhalation capsule INHALE 1 CAP INHALATION ONCE DAILY     ??? SUMAtriptan (IMITREX) 50 MG tablet Take 50 mg by mouth every two (2) hours as needed.      ??? tamsulosin (FLOMAX) 0.4 mg capsule Take 1 capsule (0.4 mg total) by mouth daily. 90 capsule 11   ??? TRINTELLIX 20 mg tablet 20 mg daily.     ??? venlafaxine (EFFEXOR-XR) 150 MG 24 hr capsule TAKE 2 CAPSULES BY MOUTH EVERY MORNING     ??? VYVANSE 70 mg capsule Take 1 capsule by mouth daily.        No current facility-administered medications for this visit.        Changes to medications: Courtney Erickson reports no changes at this time.    Allergies   Allergen Reactions   ??? Lyrica [Pregabalin] Other (See Comments)     Unsteady gate, causing falls   ??? Gabapentin Nausea And Vomiting   ??? Tramadol Rash       Changes to allergies: No    SPECIALTY MEDICATION ADHERENCE  [name]: about 10 days of medicine on hand     Medication Adherence    Patient reported X missed doses in the last month: 0  Specialty Medication: [name] bid  Patient is on additional specialty medications: No  Informant: patient  Confirmed plan for next specialty medication refill: delivery by pharmacy          Specialty medication(s) dose(s) confirmed: Regimen is correct and unchanged.     Are there any concerns with adherence? No    Adherence counseling provided? Not needed    CLINICAL MANAGEMENT AND INTERVENTION      Clinical Benefit Assessment:    Do you feel the medicine is effective or helping your condition? Yes    Clinical Benefit counseling provided? Not needed    Adverse Effects Assessment:    Are you experiencing any side effects? No    Are you experiencing difficulty administering your medicine? No    Quality of Life Assessment:    How many days over the past month did your polycythemia vera  keep you from your normal activities? For example, brushing your teeth or getting up in the morning. 0    Have you discussed this with your provider? Not needed    Therapy Appropriateness:    Is therapy appropriate? Yes, therapy is appropriate and should be continued    DISEASE/MEDICATION-SPECIFIC INFORMATION      N/A    PATIENT SPECIFIC NEEDS     - Does the patient have any physical, cognitive, or cultural barriers? No    - Is the patient high risk? Yes, patient is taking oral chemotherapy. Appropriateness of therapy as been assessed    - Does the patient require a Care Management Plan? No     - Does the patient require physician intervention or other additional services (i.e. nutrition, smoking cessation, social work)? No      SHIPPING     Specialty Medication(s) to be Shipped:   Hematology/Oncology: Jakafi    Other medication(s) to be shipped: No additional medications requested for fill at this time     Changes to insurance: No    Delivery Scheduled: Yes, Expected medication delivery date: 01/12/21.     Medication will be delivered via Next Day Courier [facility]    The patient will receive a drug information handout for each medication shipped and additional FDA Medication Guides as required.  Verified that patient has previously received a Conservation officer, historic buildings.    All of the patient's questions and concerns have been addressed.    [name] Shared [facility] Specialty Pharmacist

## 2021-01-11 MED FILL — JAKAFI 15 MG TABLET: ORAL | 30 days supply | Qty: 60 | Fill #1

## 2021-01-12 ENCOUNTER — Encounter: Admit: 2021-01-12 | Discharge: 2021-01-13 | Payer: MEDICARE

## 2021-01-12 DIAGNOSIS — R3915 Urgency of urination: Principal | ICD-10-CM

## 2021-01-12 DIAGNOSIS — N3010 Interstitial cystitis (chronic) without hematuria: Principal | ICD-10-CM

## 2021-01-12 LAB — URINALYSIS WITH CULTURE REFLEX
BILIRUBIN UA: NEGATIVE
BLOOD UA: NEGATIVE
GLUCOSE UA: NEGATIVE
KETONES UA: NEGATIVE
LEUKOCYTE ESTERASE UA: NEGATIVE
NITRITE UA: NEGATIVE
PH UA: 5.5 (ref 5.0–9.0)
PROTEIN UA: NEGATIVE
RBC UA: 0 /HPF (ref <4)
SPECIFIC GRAVITY UA: 1.025 (ref 1.005–1.040)
SQUAMOUS EPITHELIAL: 9 /HPF — ABNORMAL HIGH (ref 0–5)
UROBILINOGEN UA: 0.2
WBC UA: 2 /HPF (ref 0–5)

## 2021-01-12 MED ORDER — MIRABEGRON ER 50 MG TABLET,EXTENDED RELEASE 24 HR
ORAL_TABLET | Freq: Every day | ORAL | 11 refills | 90 days | Status: CP
Start: 2021-01-12 — End: 2021-04-12

## 2021-01-12 MED ORDER — PENTOSAN POLYSULFATE SODIUM 100 MG CAPSULE
ORAL_CAPSULE | Freq: Three times a day (TID) | ORAL | 0 refills | 90.00000 days | Status: CP
Start: 2021-01-12 — End: 2021-04-12

## 2021-01-12 NOTE — Unmapped (Signed)
Assessment:   Hx gross hematuria s/p (-) work-up in 12/2018  ?IC s/p interstim placement in past removed 2/2 infection  Polycythemia vera  DM2  Active smoker    This is a 55 y.o.-year-old female with a history of gross hematuria who returns in follow-up for bothersome lower urinary tract symptoms of unclear etiology.  Constellation of symptoms are consistent with IC/BPS, although given significant smoking history urothelial carcinoma must remain in the back of our differential.  Reassuringly her UA is without evidence of microhematuria today and she denies recent episodes of gross hematuria.     I reviewed with the patient today her urinary complaints, symptoms and evaluation in the clinic. Her postvoid residual in clinic is 35cc.  Her urinary symptoms appear to improve with mirabegron; however, she again notes running out of the medication.  We discussed the utility of her medication therapy which includes mybetriq. The risk and side effects of the medications were again discussed.  I emphasized to her the importance of continually taking this medication to maximize the benefit.  We also discussed the utility of Elmiron given that she notes having a prior good response to this.  She elected to proceed with treatment and the potential risks/side effects of this medication were discussed as well.  We will plan to see her back in about 3 to 4 months for symptom check if she notes persistently bothersome LUTS we will consider referral for bladder Botox versus PTNS.    Plan:  [ ]  UA reflex culture  [ ]  Continue mirabegron 50 mg daily  [ ]  Resume Elmiron 100 mg 3 times daily either 1 hour before or 2 hours after meals given questionable history of interstitial cystitis  [ ]  Return to clinic in 3-4 months with any available APP for symptom check  [ ]  As needed referral to Dr. Raoul Pitch for discussion of bladder Botox versus PTNS for medication refractory urge predominant LUTS    ===========================================================================================    CHIEF COMPLAINT: History of BPH and bothersome lower urinary tract symptoms here for follow-up.    BRIEF HISTORY OF PRESENT ILLNESS:  Courtney Erickson is very pleasant 55 y.o.-year-old female with past medical history of gross hematuria status post negative evaluation in 2020 and prior InterStim placement removed for infection who is seen in follow-up for the evaluation of urge predominant lower urinary tract symptoms.  She is previously been followed by APP Duffy Rhody and Dr. Raoul Pitch, who recently performed a cystoscopy on her in March 2020.  Last seen by me 12/10/2019 during which time she was noted to have persistent urge predominant voiding symptoms for which she was started on mirabegron 50 mg daily.  Regarding hematuria history her last UA from 12/08/2019 did not reveal any RBCs.  She is a current smoker (1 pack/week x 30 years).  She denies anticoagulant use and does admit to past urinary tract infections.    In the interim she notes that mirabegron helped with urgency but she since ran out and has had a recurrence of her symptoms.  She denies fevers, chills, night sweats, hematuria, pyuria, dysuria, urinary retention and chest pain.  She does note chronic lower back pain and was asking if this could be related to her lower urinary tract symptoms.  Reassuringly her PVR today is 35 cc.  She continues to smoke tobacco.  UA today is without evidence of hematuria and is not suggestive of infection.    PAST MEDICAL HISTORY:     Past Medical History:  Diagnosis Date   ??? Abnormal findings on esophagogastroduodenoscopy (EGD) 12/16/2013    For abdominal discomfort; DDAVP; with EUS; no abnormal findings, and pancreas unremarkable   ??? ANA positive    ??? Anxiety    ??? Asthma    ??? Atony of bladder    ??? Chronic interstitial cystitis    ??? COPD (chronic obstructive pulmonary disease) (CMS-HCC)    ??? Depression    ??? Diabetes (CMS-HCC)    ??? Difficult intravenous access    ??? Dyspareunia    ??? Endometriosis    ??? Female stress incontinence    ??? Gastric ulcer    ??? GERD (gastroesophageal reflux disease)    ??? Gross hematuria    ??? H/O colonoscopy 12/2011   ??? Incomplete bladder emptying    ??? Incontinence without sensory awareness    ??? Other chronic cystitis    ??? Other functional disorder of bladder    ??? Pain medication agreement signed 08/19/2012   ??? Polycythemia vera(238.4)    ??? Scoliosis    ??? Urge incontinence    ??? Urinary frequency    ??? Von Willebrand's disease (CMS-HCC)        PAST SURGICAL HISTORY:   Past Surgical History:   Procedure Laterality Date   ??? APPENDECTOMY     ??? BREAST BIOPSY Right     age 44     benign   ??? HYSTERECTOMY  1999    total   ??? InterStim Device  2003    Bladder Stimulator   ??? OOPHORECTOMY     ??? PR IMPLANT PERIPH/GASTRIC NEUROSTIM/RECEIVER Right 02/06/2015    Procedure: INSERTION OR REPLACEMENT PERIPHERAL/GASTRIC NEUROSTIMULATOR PULSE GENERAT/RECEIVE, DIRECT/INDUCTIV COUPLING;  Surgeon: Smith Robert, MD;  [facility]: [facility];  Service: Urology   ??? PR REVISE/REMOVE PERIPH/GASTRIC NEUROSTIM/RECEIVER N/A 09/22/2017    Procedure: REVISION OR REMOVAL OF PERIPHERAL OR GASTRIC NEUROSTIMULATOR PULSE GENERATOR OR RECEIVER;  Surgeon: Norval Morton, MD;  [facility] OR [facility];  Service: Urology   ??? PR REVISE/REMOVE PERIPHERAL NEUROELECTRODE N/A 09/22/2017    Procedure: REVISION OR REMOVAL OF PERIPHERAL NEUROSTIMULATOR ELECTRODE ARRAY;  Surgeon: Norval Morton, MD;  [facility];  Service: Urology   ??? ROTATOR CUFF REPAIR Left     x 2   ??? TOTAL KNEE ARTHROPLASTY Left 2006    Given Humate-P       ALLERGIES:    is allergic to lyrica [pregabalin], gabapentin, and tramadol.    MEDICATIONS:  Current Outpatient Medications   Medication Sig Dispense Refill   ??? ADVAIR DISKUS 250-50 mcg/dose diskus Inhale 1 puff 2 (two) times a day.      ??? amitriptyline (ELAVIL) 50 MG tablet TAKE 1 TABLET BY MOUTH EVERYDAY AT BEDTIME     ??? aspirin (ECOTRIN) 81 MG tablet Take 1 tablet (81 mg total) by mouth daily. 150 tablet 2   ??? atorvastatin (LIPITOR) 20 MG tablet Take 1 tablet (20 mg total) by mouth daily. 30 tablet 11   ??? BELSOMRA 20 mg tablet 20 mg daily.     ??? brexpiprazole (REXULTI) 0.5 mg Tab tablet 0.5 mg daily.     ??? cyclobenzaprine (FLEXERIL) 5 MG tablet Take 5 mg by mouth daily.     ??? dicyclomine (BENTYL) 20 mg tablet Take 20 mg by mouth daily.     ??? esomeprazole (NEXIUM) 40 MG capsule Take 40 mg by mouth daily.     ??? fluticasone propionate (FLONASE) 50 mcg/actuation nasal spray SPRAY  1 SPRAY INTO EACH NOSTRIL EVERY DAY 16 mL 0   ??? folic acid (FOLVITE) 1 MG tablet Take 1,000 mcg by mouth daily.     ??? ipratropium-albuterol (DUO-NEB) 0.5-2.5 mg/3 mL nebulizer Inhale 3 mL by nebulization every six (6) hours as needed. 360 mL 11   ??? isosorbide mononitrate (IMDUR) 30 MG 24 hr tablet Take 30 mg by mouth daily.      ??? LATUDA 80 mg Tab TAKE 1 TABLET BY MOUTH EVERY DAY 30 tablet 0   ??? OLANZapine (ZYPREXA) 5 MG tablet TAKE 2 TABS BY MOUTH AT BEDTIME FOR 1 WEEK, THEN 1 TAB BY MOUTH AT BEDTIME FOR 1 WEEK, THEN STOP     ??? predniSONE (DELTASONE) 10 MG tablet Take 4 tablets (40 mg total) by mouth daily. 20 tablet 0   ??? propranoloL (INDERAL) 40 MG tablet Take 1 tablet by mouth daily.     ??? ruxolitinib (JAKAFI) 15 mg tablet Take 1 tablet (15 mg total) by mouth Two (2) times a day. 60 tablet 5   ??? SPIRIVA WITH HANDIHALER 18 mcg inhalation capsule INHALE 1 CAP INHALATION ONCE DAILY     ??? SUMAtriptan (IMITREX) 50 MG tablet Take 50 mg by mouth every two (2) hours as needed.      ??? tamsulosin (FLOMAX) 0.4 mg capsule Take 1 capsule (0.4 mg total) by mouth daily. 90 capsule 11   ??? TRINTELLIX 20 mg tablet 20 mg daily.     ??? venlafaxine (EFFEXOR-XR) 150 MG 24 hr capsule TAKE 2 CAPSULES BY MOUTH EVERY MORNING     ??? VYVANSE 70 mg capsule Take 1 capsule by mouth daily.        No current facility-administered medications for this visit. SOCIAL HISTORY:  Social History     Tobacco Use   ??? Smoking status: Current Some Day Smoker     Packs/day: 0.50     Years: 30.00     Pack years: 15.00     Types: Cigarettes     Start date: 05/13/1999     Last attempt to quit: 11/11/2016     Years since quitting: 4.1   ??? Smokeless tobacco: Never Used   ??? Tobacco comment: Quit but started back   Vaping Use   ??? Vaping Use: Never used   Substance Use Topics   ??? Alcohol use: No     Alcohol/week: 0.0 standard drinks     Comment: denies   ??? Drug use: No        FAMILY HISTORY: mother with bladder cancer    Family History   Problem Relation Age of Onset   ??? Hyperlipidemia Sister    ??? GU problems Mother    ??? Cancer Mother 30        Bladder cancer   ??? Cancer Father 37        Colon cancer   ??? Prostate cancer Neg Hx    ??? Kidney cancer Neg Hx    ??? Urolithiasis Neg Hx        REVIEW OF SYSTEMS:  A 10-system review of systems was completed by the patient and reviewed by me today.  All pertinent positives were discussed in history of present illness.    PHYSICAL EXAMINATION:  VITAL SIGNS:  The patient is afebrile.  Vital signs are stable.  GENERAL:  In no apparent distress; strong smell of tobacco in the room.  HEENT:  Eyes, ears and mouth appeared normal (mask intermittently falling off face).  LYMPHATICS:  No cervical lymphadenopathy.  LUNGS:  Chest wall excursion symmetric bilaterally. No audible wheezing  ABDOMEN:  Soft, nontender, nondistended.  BACK:  Negative CVA tenderness.  EXTREMITIES:  No lower extremity edema.  SKIN:  No rashes or jaundice.  NEUROLOGIC:  No focal deficits on neurologic exam.    LABORATORY TESTING:      Chemistry        Component Value Date/Time    NA 137 12/14/2020 1017    NA 141 01/27/2015 1056    K 4.0 12/14/2020 1017    K 4.6 01/27/2015 1056    CL 104 12/14/2020 1017    CL 105 01/27/2015 1056    CO2 27.0 12/14/2020 1017    CO2 27 01/27/2015 1056    BUN 11 12/14/2020 1017    BUN 7 01/27/2015 1056    CREATININE 0.69 12/14/2020 1017    CREATININE 0.80 01/27/2015 1056    GLU 102 12/14/2020 1017        Component Value Date/Time    CALCIUM 9.0 12/14/2020 1017    CALCIUM 9.6 01/27/2015 1056    ALKPHOS 72 12/14/2020 1017    AST 62 (H) 12/14/2020 1017    ALT 46 12/14/2020 1017    BILITOT 1.3 (H) 12/14/2020 1017              PSA trend:  No results found for: PSA, PSADIAG, PSASCRN, PSAFREE, PSATOT    PVR: 35cc    Candace Cruise. Dow Adolph, MD MPH  Assistant Professor  Department of Urology   [facility]  [id number]  [place], [place] [address]  p [phone] f [phone]

## 2021-02-05 NOTE — Unmapped (Signed)
[facility] Refill Coordination Note    Specialty Medication(s) to be Shipped:   Hematology/Oncology: Courtney Erickson    Other medication(s) to be shipped: No additional medications requested for fill at this time     Courtney Erickson, DOB: 04/01/1966  Phone: [phone] (home)       All above HIPAA information was verified with patient.     Was a Nurse, learning disability used for this call? No    Completed refill call assessment today to schedule patient's medication shipment from the [facility] [phone]).  All relevant notes have been reviewed.     Specialty medication(s) and dose(s) confirmed: Regimen is correct and unchanged.   Changes to medications: Courtney Erickson reports no changes at this time.  Changes to insurance: No  New side effects reported not previously addressed with a pharmacist or physician: None reported  Questions for the pharmacist: No    Confirmed patient received a Conservation officer, historic buildings and a Surveyor, mining with first shipment. The patient will receive a drug information handout for each medication shipped and additional FDA Medication Guides as required.       DISEASE/MEDICATION-SPECIFIC INFORMATION        N/A    SPECIALTY MEDICATION ADHERENCE     Medication Adherence    Patient reported X missed doses in the last month: 0  Specialty Medication: [name]  Patient is on additional specialty medications: No  Informant: patient              Were doses missed due to medication being on hold? No    [name]: 11 days of medicine on hand       REFERRAL TO PHARMACIST     Referral to the pharmacist: Not needed      [facility]     Shipping address confirmed in Epic.     Delivery Scheduled: Yes, Expected medication delivery date: 02/14/21.     Medication will be delivered via Next Day Courier to the prescription address in [employer] [place].    Courtney Erickson   [facility] Specialty Technician

## 2021-02-07 MED ORDER — MIRTAZAPINE 15 MG TABLET
0 days
Start: 2021-02-07 — End: 2021-03-22

## 2021-02-13 MED FILL — JAKAFI 15 MG TABLET: ORAL | 30 days supply | Qty: 60 | Fill #2

## 2021-03-01 MED ORDER — DIAZEPAM 5 MG TABLET
0 days
Start: 2021-03-01 — End: ?

## 2021-03-01 NOTE — Unmapped (Signed)
[facility] Refill Coordination Note    Specialty Medication(s) to be Shipped:   Hematology/Oncology: Courtney Erickson    Other medication(s) to be shipped: No additional medications requested for fill at this time     Courtney Erickson, DOB: Oct 20, 1966  Phone: [phone] (home)       All above HIPAA information was verified with patient.     Was a Nurse, learning disability used for this call? No    Completed refill call assessment today to schedule patient's medication shipment from the [facility] [phone]).  All relevant notes have been reviewed.     Specialty medication(s) and dose(s) confirmed: Regimen is correct and unchanged.   Changes to medications: Diamonds reports no changes at this time.  Changes to insurance: No  New side effects reported not previously addressed with a pharmacist or physician: None reported  Questions for the pharmacist: No    Confirmed patient received a Conservation officer, historic buildings and a Surveyor, mining with first shipment. The patient will receive a drug information handout for each medication shipped and additional FDA Medication Guides as required.       DISEASE/MEDICATION-SPECIFIC INFORMATION        N/A    SPECIALTY MEDICATION ADHERENCE     Medication Adherence    Patient reported X missed doses in the last month: 0  Specialty Medication: [name]  Patient is on additional specialty medications: No  Informant: patient              Were doses missed due to medication being on hold? No    Jakafi  15 mg: 18 days of medicine on hand        REFERRAL TO PHARMACIST     Referral to the pharmacist: Not needed      [facility]     Shipping address confirmed in Epic.     Delivery Scheduled: Yes, Expected medication delivery date: 03/15/21.     Medication will be delivered via Next Day Courier to the prescription address in Epic [place].    Courtney Erickson M Courtney Erickson   [facility] Specialty Technician

## 2021-03-11 ENCOUNTER — Other Ambulatory Visit: Payer: Self-pay

## 2021-03-11 ENCOUNTER — Emergency Department
Admission: EM | Admit: 2021-03-11 | Discharge: 2021-03-11 | Disposition: A | Discharge status: Home / Self Care | Payer: Medicare Other | Attending: Emergency Medicine | Admitting: Emergency Medicine

## 2021-03-11 DIAGNOSIS — Z9861 Coronary angioplasty status: Secondary | ICD-10-CM | POA: Diagnosis not present

## 2021-03-11 DIAGNOSIS — I129 Hypertensive chronic kidney disease with stage 1 through stage 4 chronic kidney disease, or unspecified chronic kidney disease: Secondary | ICD-10-CM | POA: Diagnosis not present

## 2021-03-11 DIAGNOSIS — J45909 Unspecified asthma, uncomplicated: Secondary | ICD-10-CM | POA: Insufficient documentation

## 2021-03-11 DIAGNOSIS — E1140 Type 2 diabetes mellitus with diabetic neuropathy, unspecified: Secondary | ICD-10-CM | POA: Diagnosis not present

## 2021-03-11 DIAGNOSIS — Z7952 Long term (current) use of systemic steroids: Secondary | ICD-10-CM | POA: Diagnosis not present

## 2021-03-11 DIAGNOSIS — Z79899 Other long term (current) drug therapy: Secondary | ICD-10-CM | POA: Diagnosis not present

## 2021-03-11 DIAGNOSIS — E1122 Type 2 diabetes mellitus with diabetic chronic kidney disease: Secondary | ICD-10-CM | POA: Insufficient documentation

## 2021-03-11 DIAGNOSIS — Z7982 Long term (current) use of aspirin: Secondary | ICD-10-CM | POA: Insufficient documentation

## 2021-03-11 DIAGNOSIS — J449 Chronic obstructive pulmonary disease, unspecified: Secondary | ICD-10-CM | POA: Insufficient documentation

## 2021-03-11 DIAGNOSIS — N189 Chronic kidney disease, unspecified: Secondary | ICD-10-CM | POA: Insufficient documentation

## 2021-03-11 DIAGNOSIS — Z8551 Personal history of malignant neoplasm of bladder: Secondary | ICD-10-CM | POA: Insufficient documentation

## 2021-03-11 DIAGNOSIS — N390 Urinary tract infection, site not specified: Secondary | ICD-10-CM | POA: Insufficient documentation

## 2021-03-11 DIAGNOSIS — Z96652 Presence of left artificial knee joint: Secondary | ICD-10-CM | POA: Diagnosis not present

## 2021-03-11 DIAGNOSIS — F17210 Nicotine dependence, cigarettes, uncomplicated: Secondary | ICD-10-CM | POA: Diagnosis not present

## 2021-03-11 DIAGNOSIS — R339 Retention of urine, unspecified: Secondary | ICD-10-CM | POA: Insufficient documentation

## 2021-03-11 LAB — BASIC METABOLIC PANEL
Anion gap: 11 (ref 5–15)
BUN: 19 mg/dL (ref 6–20)
CO2: 19 mmol/L — ABNORMAL LOW (ref 22–32)
Calcium: 8.8 mg/dL — ABNORMAL LOW (ref 8.9–10.3)
Chloride: 108 mmol/L (ref 98–111)
Creatinine, Ser: 0.83 mg/dL (ref 0.44–1.00)
GFR, Estimated: >60 mL/min (ref >60)
Glucose, Bld: 102 mg/dL — ABNORMAL HIGH (ref 70–99)
Potassium: 4.3 mmol/L (ref 3.5–5.1)
Sodium: 138 mmol/L (ref 135–145)

## 2021-03-11 LAB — CBC WITH DIFFERENTIAL/PLATELET
Abs Immature Granulocytes: 0.12 10*3/uL — ABNORMAL HIGH (ref 0.00–0.07)
Basophils Absolute: 0.1 10*3/uL (ref 0.0–0.1)
Basophils Relative: 1 %
Eosinophils Absolute: 0.2 10*3/uL (ref 0.0–0.5)
Eosinophils Relative: 3 %
HCT: 35.7 % — ABNORMAL LOW (ref 36.0–46.0)
Hemoglobin: 12.3 g/dL (ref 12.0–15.0)
Immature Granulocytes: 2 %
Lymphocytes Relative: 26 %
Lymphs Abs: 2.1 10*3/uL (ref 0.7–4.0)
MCH: 31.9 pg (ref 26.0–34.0)
MCHC: 34.5 g/dL (ref 30.0–36.0)
MCV: 92.5 fL (ref 80.0–100.0)
Monocytes Absolute: 0.7 10*3/uL (ref 0.1–1.0)
Monocytes Relative: 8 %
Neutro Abs: 5 10*3/uL (ref 1.7–7.7)
Neutrophils Relative %: 60 %
Platelets: 381 10*3/uL (ref 150–400)
RBC: 3.86 MIL/uL — ABNORMAL LOW (ref 3.87–5.11)
RDW: 13.1 % (ref 11.5–15.5)
WBC: 8.1 10*3/uL (ref 4.0–10.5)
nRBC: 0 % (ref 0.0–0.2)

## 2021-03-11 LAB — URINALYSIS, COMPLETE (UACMP) WITH MICROSCOPIC
Bilirubin Urine: NEGATIVE
Glucose, UA: NEGATIVE mg/dL
Ketones, ur: NEGATIVE mg/dL
Nitrite: NEGATIVE
Protein, ur: 100 mg/dL — AB
RBC / HPF: >50 RBC/hpf — ABNORMAL HIGH (ref 0–5)
Specific Gravity, Urine: 1.028 (ref 1.005–1.030)
Squamous Epithelial / HPF: NONE SEEN (ref 0–5)
WBC, UA: >50 WBC/hpf — ABNORMAL HIGH (ref 0–5)
pH: 5 (ref 5.0–8.0)

## 2021-03-11 MED ORDER — CEPHALEXIN 500 MG CAPSULE
0 days
Start: 2021-03-11 — End: 2021-03-22

## 2021-03-11 MED ORDER — SODIUM CHLORIDE 0.9 % IV SOLN
1.0000 g | Freq: Once | INTRAVENOUS | Status: AC
  Administered 2021-03-11: 1 g via INTRAVENOUS
  Filled 2021-03-11: qty 10

## 2021-03-11 MED ORDER — SODIUM CHLORIDE 0.9 % IV BOLUS
1000.0000 mL | Freq: Once | INTRAVENOUS | Status: AC
  Administered 2021-03-11: 1000 mL via INTRAVENOUS

## 2021-03-11 MED ORDER — CEPHALEXIN 500 MG PO CAPS: 500.0000 mg | ORAL_CAPSULE | Freq: Four times a day (QID) | ORAL | 0 refills | Status: AC

## 2021-03-11 MED ORDER — HALOPERIDOL LACTATE 5 MG/ML IJ SOLN
5.0000 mg | Freq: Once | INTRAMUSCULAR | Status: AC
  Administered 2021-03-11: 5 mg via INTRAVENOUS
  Filled 2021-03-11: qty 1

## 2021-03-11 MED ORDER — PHENAZOPYRIDINE HCL 100 MG PO TABS: 100.0000 mg | ORAL_TABLET | Freq: Three times a day (TID) | ORAL | 0 refills | Status: AC | PRN

## 2021-03-11 MED ORDER — OXYCODONE-ACETAMINOPHEN 5-325 MG PO TABS
1.0000 | ORAL_TABLET | Freq: Once | ORAL | Status: AC
  Administered 2021-03-11: 1 via ORAL
  Filled 2021-03-11: qty 1

## 2021-03-11 NOTE — ED Triage Notes (Signed)
Pt to ED POV for urinary retention since 1930 last night. States she feels like she has to void but cannot, only dribbles States has bladder disorder but this is not normal Pt appears uncomfortable

## 2021-03-11 NOTE — ED Provider Notes (Signed)
[facility] Emergency Department Provider Note   ____________________________________________   I have reviewed the triage vital signs and the nursing notes.   HISTORY  Chief Complaint Urinary Retention   History limited by: Not Limited   HPI Lauren Escobar is a 55 y.o. female who presents to the emergency department today because of concern for urinary retention and lower abdominal pain. The patient states that her symptoms started yesterday. States that the pain is severe. It is located in the central lower abdomen. Worse with movement. The patient has noticed decreased urination since yesterday as well. States that these symptoms have occurred to her in the past and that she has a history of interstitial cystitis.     Records reviewed. Per medical record review patient has a history of CKD, urinary retention.  Past Medical History:  Diagnosis Date  . Anxiety   . Asthma   . Bleeding disorder ([place])   . Blood dyscrasia    polycythemia vera  . Chronic kidney disease   . Depression   . Diabetes mellitus without complication ([place])   . Headache 2000  . Horizontal nystagmus    age 40  . Hypertension   . Von Willebrand's disease [facility])     Patient Active Problem List   Diagnosis Date Noted  . Fracture of phalanx of finger 05/21/2019  . Impingement syndrome of right shoulder region 08/28/2017  . Pain in joint, shoulder region (secondary) (right greater than left) 04/16/2017  . Knee pain (primary) (greater than right) 04/16/2017  . Pain due to interstitial cystitis (tertiary) 04/16/2017  . COPD (chronic obstructive pulmonary disease) ([place]) 04/15/2017  . Depression 04/15/2017  . GERD (gastroesophageal reflux disease) 04/15/2017  . Migraines 04/15/2017  . Chronic pain syndrome 04/15/2017  . Long term current use of opiate analgesic 04/15/2017  . Long term prescription opiate use 04/15/2017  . Opiate use 04/15/2017  . Urinary retention 02/05/2017   . Bladder outlet obstruction 01/21/2017  . Early satiety 12/19/2016  . Epigastric pain 11/21/2016  . Diarrhea, unspecified 11/21/2016  . Dysuria 11/21/2016  . 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 Urinary tract infection 09/27/2016  . Chest pain, unspecified 07/31/2016  . S/P cardiac cath 07/31/2016  . Unstable angina ([place]) 07/12/2016  . Balance problem 12/27/2015  . History of total knee arthroplasty 10/11/2015  . Chronic hip pain (tertiary) (bilateral) (left greater than right) 08/09/2015  . Abdominal pain, acute, right lower quadrant 05/18/2015  . Polycythemia vera ([place]) 04/20/2015  . Pain medication agreement signed 04/20/2015  . Candidiasis of female genitalia 04/05/2015  . Benign essential hypertension 03/31/2015  . Mechanical breakdown of implanted electronic neurostimulator of peripheral nerve ([place]) 01/28/2015  . Mixed hyperlipidemia 08/29/2014  . Asthma without status asthmaticus 07/28/2014  . Detrusor instability 07/28/2014  . Chest pain 07/15/2014  . Chronic cough 07/15/2014  . Chronic daily headache 07/15/2014  . 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 Headache disorder 07/15/2014  . Nystagmus, congenital 07/15/2014  . Type I diabetes mellitus ([place]) 05/23/2014  . Diabetic peripheral neuropathy ([place]) 06/25/2013  . Chronic cystitis 03/25/2013  . Atony of bladder 02/18/2013  . Urinary incontinence without sensory awareness 02/18/2013  . Female stress incontinence 01/20/2013  . Functional disorder of bladder 01/20/2013  . Urge incontinence 01/20/2013  . Overactive detrusor 01/20/2013  . Chronic interstitial cystitis 07/02/2012  . Dyspareunia 07/02/2012  . Gross hematuria 07/02/2012  . Incomplete emptying of bladder 07/02/2012  . Mixed urge and stress incontinence 07/02/2012  . Increased  frequency of urination 07/02/2012  . Von Willebrand's disease ([place]) 07/02/2012    Past Surgical History:  Procedure Laterality Date  . ABDOMINAL  HYSTERECTOMY  2002   oophorectomy  . ABDOMINAL HYSTERECTOMY    . APPENDECTOMY    . BLADDER SURGERY    . BREAST BIOPSY Left    NEG  . CARDIAC CATHETERIZATION    . CARDIAC CATHETERIZATION N/A 07/17/2016   Procedure: Left Heart Cath and Coronary Angiography;  Surgeon: Corey Skains, MD;  Location: [place] CV LAB;  Service: Cardiovascular;  Laterality: N/A;  . COLONOSCOPY WITH PROPOFOL N/A 07/27/2015   Procedure: COLONOSCOPY WITH PROPOFOL;  Surgeon: Manya Silvas, MD;  Location: [facility] ENDOSCOPY;  Service: Endoscopy;  Laterality: N/A;  . COLONOSCOPY WITH PROPOFOL N/A 05/31/2019   Procedure: COLONOSCOPY WITH PROPOFOL;  Surgeon: Lin Landsman, MD;  Location: [facility] ENDOSCOPY;  Service: Gastroenterology;  Laterality: N/A;  . ESOPHAGOGASTRODUODENOSCOPY N/A 06/18/2017   Procedure: ESOPHAGOGASTRODUODENOSCOPY (EGD);  Surgeon: Lin Landsman, MD;  Location: [facility] ENDOSCOPY;  Service: Gastroenterology;  Laterality: N/A;  . ESOPHAGOGASTRODUODENOSCOPY (EGD) WITH PROPOFOL N/A 05/31/2019   Procedure: ESOPHAGOGASTRODUODENOSCOPY (EGD) WITH PROPOFOL;  Surgeon: Lin Landsman, MD;  Location: [facility] ENDOSCOPY;  Service: Gastroenterology;  Laterality: N/A;  . REPLACEMENT TOTAL KNEE Left   . SHOULDER SURGERY Left    x 2    Prior to Admission medications   Medication Sig Start Date End Date Taking? Authorizing Provider  albuterol (VENTOLIN HFA) 108 (90 Base) MCG/ACT inhaler Inhale into the lungs. 11/03/15   [provider]  amitriptyline (ELAVIL) 50 MG tablet TAKE 1 TABLET BY MOUTH EVERYDAY AT BEDTIME 03/01/20   Lin Landsman, MD  aspirin EC 81 MG tablet Take by mouth. 05/21/17 05/21/26  [provider]  atorvastatin (LIPITOR) 20 MG tablet TAKE 1 TABLET BY MOUTH AT BEDTIME FOR HIGH CHOLESTEROL 04/21/19   [provider]  baclofen (LIORESAL) 10 MG tablet Take 10 mg by mouth 3 (three) times daily as needed. 11/12/19   [provider]  buPROPion (WELLBUTRIN XL) 300  MG 24 hr tablet Take by mouth. 12/26/15   [provider]  cetirizine (ZYRTEC) 10 MG tablet TAKE 1 TABLET BY MOUTH DAILY FOR ALLERGIES 04/27/19   [provider]  CHANTIX CONTINUING MONTH PAK 1 MG tablet Take 1 mg by mouth 2 (two) times daily. 11/30/19   [provider]  cyclobenzaprine (FLEXERIL) 5 MG tablet Take 5 mg by mouth 3 (three) times daily. 08/30/19   [provider]  diclofenac Sodium (VOLTAREN) 1 % GEL  12/06/19   [provider]  dicyclomine (BENTYL) 20 MG tablet TAKE 1 TABLET (20 MG TOTAL) BY MOUTH 4 (FOUR) TIMES DAILY - BEFORE MEALS AND AT BEDTIME. 02/23/20 03/24/20  Lin Landsman, MD  esomeprazole (NEXIUM) 40 MG capsule Take 40 mg by mouth daily.  08/07/14   [provider]  Fluticasone-Salmeterol (ADVAIR DISKUS) 250-50 MCG/DOSE AEPB Inhale 1 puff into the lungs 2 (two) times daily.  01/02/15   [provider]  folic acid (FOLVITE) 1 MG tablet Take 1 mg by mouth daily. 05/01/19   [provider]  gabapentin (NEURONTIN) 300 MG capsule Take 300 mg by mouth 3 (three) times daily. 10/30/19   [provider]  hydrOXYzine (ATARAX/VISTARIL) 25 MG tablet TAKE 1 4 ORAL TABLETS UP TO TWICE DAILY AS NEEDED FOR SEVERE ANXIETY OR SLEEP 06/01/19   [provider]  hydrOXYzine (ATARAX/VISTARIL) 50 MG tablet SMARTSIG:1 Tablet(s) By Mouth 1 to 3 Times Daily  12/14/19   [provider]  ipratropium-albuterol (DUONEB) 0.5-2.5 (3) MG/3ML SOLN 3 mL Q4H (RT).  07/11/14   [provider]  isosorbide mononitrate (IMDUR) 30 MG 24 hr tablet Take 30 mg by mouth daily. 05/01/19   [provider]  NITROGLYCERIN SL Place 1 tablet under the tongue.    [provider]  oxybutynin (DITROPAN-XL) 5 MG 24 hr tablet TAKE 1 TABLET BY MOUTH EVERYDAY AT BEDTIME 02/23/20   Vanga, Tally Due, MD  PREMARIN 0.625 MG tablet Take 0.625 mg by mouth daily. 12/07/19   [provider]  propranolol (INDERAL) 40 MG  tablet TAKE 1 TABLET TWICE DAILY TO PREVENT HEADACHES 05/24/19   [provider]  REXULTI 0.5 MG TABS TAKE 1 TABLET BY MOUTH AT BEDTIME FOR 1 WEEK THEN INCREASE TO 1 MG TABLET 10/20/19   [provider]  risperiDONE (RISPERDAL) 0.5 MG tablet TAKE 1 TABLET BY MOUTH AT BEDTIME FOR 1 WEEK, THEN TAKE 2 TABLETS BY MOUTH AT BEDTIME THEREAFTER 07/28/19   [provider]  SPIRIVA HANDIHALER 18 MCG inhalation capsule 1 capsule daily. 10/25/19   [provider]  SUMAtriptan (IMITREX) 50 MG tablet Take by mouth. 02/01/13   [provider]  tamsulosin (FLOMAX) 0.4 MG CAPS capsule Take by mouth. 07/08/18   [provider]  triamcinolone cream (KENALOG) 0.1 % APPLY TO AFFECTED AREA (BACK OF NECK) TWICE DAILY FOR UP TO 2 WEEKS FOR CONTACT DERMATITIS 09/20/19   [provider]  venlafaxine XR (EFFEXOR-XR) 150 MG 24 hr capsule Take 300 mg by mouth every morning. 10/14/19   [provider]  VRAYLAR capsule Take 1.5 mg by mouth. 11/16/19   [provider]  VYVANSE 40 MG capsule Take 40 mg by mouth every morning. 11/16/19   [provider]    Allergies Aspirin, Pregabalin, and Tramadol  Family History  Problem Relation Age of Onset  . Cancer Father        prostate  . Stroke Father   . Diabetes Father   . Cancer Mother        bladder  . Breast cancer Maternal Grandmother     Social History Social History   Tobacco Use  . Smoking status: Current Every Day Smoker    Packs/day: 2.75    Years: 29.00    Pack years: 79.75    Types: Cigarettes  . Smokeless tobacco: Never Used  Substance Use Topics  . Alcohol use: No    Alcohol/week: 0.0 standard drinks  . Drug use: No    Review of Systems Constitutional: No fever/chills Eyes: No visual changes. ENT: No sore throat. Cardiovascular: Denies chest pain. Respiratory: Denies shortness of breath. Gastrointestinal: Positive for lower abdominal pain.  Genitourinary:  Positive for decreased urination.  Musculoskeletal: Negative for back pain. Skin: Negative for rash. Neurological: Negative for headaches, focal weakness or numbness.  ____________________________________________   PHYSICAL EXAM:  VITAL SIGNS: ED Triage Vitals  Enc Vitals Group     BP 03/11/21 1738 131/65     Pulse Rate 03/11/21 1738 85     Resp 03/11/21 1738 (!) 22     Temp 03/11/21 1738 98.2 F (36.8 C)     Temp Source 03/11/21 1738 Oral     SpO2 03/11/21 1738 97 %     Weight 03/11/21 1739 140 lb (63.5 kg)     Height 03/11/21 1739 4\' 11"  (1.499 m)     Head Circumference --      Peak Flow --  Pain Score 03/11/21 1738 10   Constitutional: Alert and oriented.  Eyes: Conjunctivae are normal.  ENT      Head: Normocephalic and atraumatic.      Nose: No congestion/rhinnorhea.      Mouth/Throat: Mucous membranes are moist.      Neck: No stridor. Hematological/Lymphatic/Immunilogical: No cervical lymphadenopathy. Cardiovascular: Normal rate, regular rhythm.  No murmurs, rubs, or gallops.  Respiratory: Normal respiratory effort without tachypnea nor retractions. Breath sounds are clear and equal bilaterally. No wheezes/rales/rhonchi. Gastrointestinal: Soft and tender to palpation in the suprapubic region.  Genitourinary: Deferred Musculoskeletal: Normal range of motion in all extremities. No lower extremity edema. Neurologic:  Normal speech and language. No gross focal neurologic deficits are appreciated.  Skin:  Skin is warm, dry and intact. No rash noted. Psychiatric: Mood and affect are normal. Speech and behavior are normal. Patient exhibits appropriate insight and judgment.  ____________________________________________    LABS (pertinent positives/negatives)  BMP wnl except co2 19, glu 102, ca 8.8 CBC wbc 8.1, hgb 12.3, plt 381 UA cloudy, large hgb dipstick, large leukocytes, >50 RBC and WBC, WBC clumps  present  ____________________________________________   EKG  None  ____________________________________________    RADIOLOGY  None  ____________________________________________   PROCEDURES  Procedures  ____________________________________________   INITIAL IMPRESSION / ASSESSMENT AND PLAN / ED COURSE  Pertinent labs & imaging results that were available during my care of the patient were reviewed by me and considered in my medical decision making (see chart for details).   Patient presented to the emergency department today because of concerns for suprapubic pain and decreased urination.  Work-up here is concerning for urinary tract infection.  Patient was given dose of antibiotics here in the emergency department.  Did feel somewhat better after treatment.  This time will plan on discharge home with further antibiotics.  ____________________________________________   FINAL CLINICAL IMPRESSION(S) / ED DIAGNOSES  Final diagnoses:  Lower urinary tract infectious disease     Note: This dictation was prepared with Dragon dictation. Any transcriptional errors that result from this process are unintentional     Nance Pear, MD 03/11/21 2252

## 2021-03-11 NOTE — Discharge Instructions (Addendum)
Please seek medical attention for any high fevers, chest pain, shortness of breath, change in behavior, persistent vomiting, bloody stool or any other new or concerning symptoms.  

## 2021-03-11 NOTE — ED Notes (Signed)
55ml urine return from foley. Pt remains in pain.

## 2021-03-11 NOTE — ED Notes (Signed)
Pt bladder scanned, no urine noted.

## 2021-03-12 MED ORDER — DEXTROAMPHETAMINE-AMPHETAMINE 20 MG TABLET
0 days
Start: 2021-03-12 — End: ?

## 2021-03-14 MED FILL — JAKAFI 15 MG TABLET: ORAL | 30 days supply | Qty: 60 | Fill #3

## 2021-03-15 MED ORDER — PHENAZOPYRIDINE 100 MG TABLET
0 days
Start: 2021-03-15 — End: 2021-03-22

## 2021-03-19 MED ORDER — ALBUTEROL SULFATE HFA 90 MCG/ACTUATION AEROSOL INHALER
0 days
Start: 2021-03-19 — End: ?

## 2021-03-22 ENCOUNTER — Encounter: Admit: 2021-03-22 | Discharge: 2021-03-23 | Payer: MEDICARE

## 2021-03-22 ENCOUNTER — Encounter
Admit: 2021-03-22 | Discharge: 2021-03-23 | Payer: MEDICARE | Attending: Hematology & Oncology | Primary: Hematology & Oncology

## 2021-03-22 DIAGNOSIS — G4700 Insomnia, unspecified: Principal | ICD-10-CM

## 2021-03-22 DIAGNOSIS — D45 Polycythemia vera: Principal | ICD-10-CM

## 2021-03-22 LAB — COMPREHENSIVE METABOLIC PANEL
ALBUMIN: 3.9 g/dL (ref 3.4–5.0)
ALKALINE PHOSPHATASE: 62 U/L (ref 46–116)
ALT (SGPT): 34 U/L (ref 10–49)
ANION GAP: 3 mmol/L — ABNORMAL LOW (ref 5–14)
AST (SGOT): 60 U/L — ABNORMAL HIGH (ref <=34)
BILIRUBIN TOTAL: 1 mg/dL (ref 0.3–1.2)
BLOOD UREA NITROGEN: 9 mg/dL (ref 9–23)
BUN / CREAT RATIO: 11
CALCIUM: 9.6 mg/dL (ref 8.7–10.4)
CHLORIDE: 107 mmol/L (ref 98–107)
CO2: 29 mmol/L (ref 20.0–31.0)
CREATININE: 0.79 mg/dL
EGFR CKD-EPI (2021) FEMALE: 88 mL/min/{1.73_m2} (ref >=60)
GLUCOSE RANDOM: 119 mg/dL (ref 70–179)
POTASSIUM: 4.2 mmol/L (ref 3.4–4.8)
PROTEIN TOTAL: 6.8 g/dL (ref 5.7–8.2)
SODIUM: 139 mmol/L (ref 135–145)

## 2021-03-22 LAB — CBC W/ AUTO DIFF
BASOPHILS ABSOLUTE COUNT: 0.1 10*9/L (ref 0.0–0.1)
BASOPHILS RELATIVE PERCENT: 0.9 %
EOSINOPHILS ABSOLUTE COUNT: 0.2 10*9/L (ref 0.0–0.5)
EOSINOPHILS RELATIVE PERCENT: 2.8 %
HEMATOCRIT: 35.6 % (ref 34.0–44.0)
HEMOGLOBIN: 12.2 g/dL (ref 11.3–14.9)
LYMPHOCYTES ABSOLUTE COUNT: 1.9 10*9/L (ref 1.1–3.6)
LYMPHOCYTES RELATIVE PERCENT: 28.9 %
MEAN CORPUSCULAR HEMOGLOBIN CONC: 34.2 g/dL (ref 32.0–36.0)
MEAN CORPUSCULAR HEMOGLOBIN: 30.3 pg (ref 25.9–32.4)
MEAN CORPUSCULAR VOLUME: 88.6 fL (ref 77.6–95.7)
MEAN PLATELET VOLUME: 9.1 fL (ref 6.8–10.7)
MONOCYTES ABSOLUTE COUNT: 0.5 10*9/L (ref 0.3–0.8)
MONOCYTES RELATIVE PERCENT: 8.4 %
NEUTROPHILS ABSOLUTE COUNT: 3.8 10*9/L (ref 1.8–7.8)
NEUTROPHILS RELATIVE PERCENT: 59 %
PLATELET COUNT: 344 10*9/L (ref 150–450)
RED BLOOD CELL COUNT: 4.02 10*12/L (ref 3.95–5.13)
RED CELL DISTRIBUTION WIDTH: 13.3 % (ref 12.2–15.2)
WBC ADJUSTED: 6.5 10*9/L (ref 3.6–11.2)

## 2021-03-22 LAB — FERRITIN: FERRITIN: 49 ng/mL

## 2021-03-22 LAB — LACTATE DEHYDROGENASE: LACTATE DEHYDROGENASE: 195 U/L (ref 120–246)

## 2021-03-22 MED ORDER — RUXOLITINIB 15 MG TABLET
ORAL_TABLET | Freq: Two times a day (BID) | ORAL | 5 refills | 30 days | Status: CP
Start: 2021-03-22 — End: 2021-04-21
  Filled 2021-04-12: qty 60, 30d supply, fill #0

## 2021-03-22 NOTE — Unmapped (Signed)
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 This treatment controlled her counts, but did not relieve many of the associated MPN symptoms.  For this reason, she was started on ruxolitinib.  At 15 mg BID, her counts are well controlled.       The goal was to improve her functional status and consider a transition to interferon.  However, the more I get to know her, the more I realize that the psychiatric risks are too great.  She is tolerating her current treatment well.  I think it is best to continue her current treatment.     One reason for the switch was to control her MPN systems.  Her symptom score has returned to her pre-ruxolitinib score.  There are many factors that are contributing to her current score that are beyond her PCV. The biggest problem is her lack of sleep.  In fact, I suspect only the pruritus, parts of her bone pain, and a portion of her NS are controlled by ruxolitinib.  However, given her tolerance for the drug and its control over her CBC, I will continue it.      Low dose aspirin is reasonable given her cardiovascular risk factors. Given her h/o of vWD and her controlled platelet count, I would stay with once a day.  Of note, her last vWF testing was normal.  This is not unheard of in vWD.      PLAN:  1) Continue Ruxolitinib at 15 mg BID  2) I have put in a referral to sleep clinic.   3) She should drop the 2:00 PM  Adderall.   4) Continue CT scan for lung cancer screening (11/2021    RISK ASSESSMENT:  ?? 15.8/14/596  ?? No h/o thrombosis  ?? 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 10 yr overall survival: 94%; AML: 3%; MF: 5%    ?? 20 yr overall survival: 66%; AML: 20%; MF: 18%    PCV Thrombosis Risk  Age > 60:    0  History of thrombosis:  0  Low Risk: 0 points (Every day aspirin unless presence of other high risk factors)    TX HX:  ?? 2013: Hydrea  ?? 2022: Switched to ruxolitinib 10 mg BID due to MPN sx     HEME HX  06/2012: Dx with PCV  ?? Possible splenomegaly  ?? Started on phlebotomy  ?? 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5/12.2/344    INTERVAL HX:  Courtney Erickson came for routine FU of her PCV.  We discussed the following:   ?? Medication Adherence: She reports getting her Jakafi.  She denies a significant side effects; she has missed very few doses.   ?? Polypharmacy:  We  reviewed her meds.  I have attempted to edit the list to the meds she is actually taking.   ?? Sleep: She reports very poor sleep.  She says she will go days without any sleep. She attempts to go to sleep, but is kept up by pain and restlessness. She notes pain in her knee predominantly. She has been told that she needs treatment for OSA. She notes sx c/w sleep deprivation.    ?? Psych:  She reports being bipolar.  She is on a number of psychiatric meds including 2 stimulants. She notes recent crying spells. She denies SI/SP  ?? ROS: She denied F/C/UR; her COPD is at baseline; no bleeding/clotting sx;     MPN 10 Score  (App: [email or web address])   [email or web address]  (Useful for monitoring patient symptoms.    Symptom Range 7/21 1/22 2/17  5/26   Fatigue in the past 24 hours (Absent) 0 1 2 3 4 5 6 7 8 9  10 (Worst Imaginable) 10 10 10+ 10   Filling up quickly when you eat (Early satiety)  (Absent) 0 1 2 3 4 5 6 7 8 9  10 (Worst Imaginable) 0 0 0 0   Abdominal discomfort  (Absent) 0 1 2 3 4 5 6 7 8 9  10 (Worst Imaginable) 0 0 0 0   Inactivity (Absent) 0 1 2 3 4 5 6 7 8 9  10 (Worst Imaginable) 8 8 8 10    Problems with concentration - (Absent) 0 1 2 3 4 5 6 7 8 9  10 (Worst Imaginable) 5 8 5 10    Numbness/ Tingling (in my hands and feet) (Absent) 0 1 2 3 4 5 6 7 8 9  10 (Worst Imaginable) 3 3 3 3    Night sweats (Absent) 0 1 2 3 4 5 6 7 8 9  10 (Worst Imaginable) 8 8 0 3   Itching (pruritus) (Absent) 0 1 2 3 4 5 6 7 8 9  10 (Worst Imaginable) 4  Night itch  aquagenic pruritus;  4 0  Sleep is better  3   Bone pain (diffuse not joint pain or arthritis) (Absent) 0 1 2 3 4 5 6 7 8 9  10 (Worst Imaginable) 3 Severe but infreq  2   0 0   Fever (>100 F) (Absent) 0 1 2 3 4 5 6 7 8 9  10 (Daily) 0 0 0 0   Unintentional weight loss last 6 months (Absent) 0 1 2 3 4 5 6 7 8 9  10 (Worst Imaginable) 0 0 0 0   Total  41 43 26 42     PHYSICAL EXAM:  VS: As recorded above  GENERAL: No acute distress  HEENT: Plates;   LYMPH NODES: No significant LAN  NECK: NO JVD  LUNGS: Wheezing noted throughout; no consolidation; extended exp phase; no acc muscle use  QIO:NGEX S1, S2  ABD: NTND; No HSM  EXT:No edema    PMHx:   ?? 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 H/O bleeding with childbirth, dental extractions, menstrual bleeding  ?? VWF measured as low as 20% and as high as 84%  ?? Responds to DDAVP  ?? Txed with Humate P75 U/kg BID for 5 days  ?? Lung cancer screening   ?? 12/19/17: Pulmonary nodules: Stable, Lung-RADS Category 2.  ?? 01/18/19: LD CT Chest: Few unchanged subcentimeter pulmonary nodules. No new nodule.   Lung-RADS: 2  ?? Severe cervical spondylosis (9/20)  ?? Colonoscopy (2016): single polyp  ?? Bladder disease: atony,  chronic interstitial cystitis, with implanted bladder stimulator  ?? S/P hysterectomy for endometriosis  ?? Psychiatric disease, stated history of major depression; h/o Abilify, Wellbutrin, BuSpar, Adderall, Valium; on Latuda, gabapentin, Topamax, Viibryd, Abilify (txed by [facility]  ?? GERD, gastric ulcer, epigastric pain, with negative EGD and EUS for pancreatic disease (8/20)  ?? Mammogram (01/2017): BI-RADS 2, 1 year follow-up recommended/scheduled  ?? Nl Cardiac Cath (9/17)   ?? COPD;????smoker (30 pk yr), on Advair and DuoNeb;   ?? PFTs (12/15)  ?? FVC was 2.27 liters, 79% of predicted  FEV1 was 1.86, 78% of predicted  FEV1 ratio was 82  FEF 25-75% liters per second was 88% of predicted    LUNG VOLUMES:  TLC was 71% of predicted  RV was 55% of predicted    DIFFUSION CAPACITY:  DLCO was 63% of predicted  DLCO/VA was 123% of predicted    FLOW VOLUME LOOP:  ? Expiratory flow volume is somewhat flattened   ??

## 2021-03-22 NOTE — Unmapped (Addendum)
PLAN:  1) Continue Ruxolitinib   2) I have put in a referral to sleep clinic.     Assessment:  1) Your blood is perfect.  This is a good drug for you. It's working great.  There is no reason that the other doctors can't do something.  2) You will not feel better until you sleep. I would drop the 2:00 Adderall.     All lab results last 24 hours:    Recent Results (from the past 24 hour(s))   Ferritin    Collection Time: 03/22/21 10:19 AM   Result Value Ref Range    Ferritin 49.0 Your iron levels are good 7.3 - 270.7 ng/mL   Lactate dehydrogenase    Collection Time: 03/22/21 10:19 AM   Result Value Ref Range    LDH 195 There is no evidence of cell deatyh 120 - 246 U/L   Comprehensive Metabolic Panel    Collection Time: 03/22/21 10:19 AM   Result Value Ref Range    Sodium 139 135 - 145 mmol/L    Potassium 4.2 3.4 - 4.8 mmol/L    Chloride 107 98 - 107 mmol/L    CO2 29.0 20.0 - 31.0 mmol/L    Anion Gap 3 (L) 5 - 14 mmol/L    BUN 9 9 - 23 mg/dL    Creatinine 1.61 Kidneys are good 0.60 - 0.80 mg/dL    BUN/Creatinine Ratio 11     eGFR CKD-EPI (2021) Female 88 >=60 mL/min/1.46m2    Glucose 119 70 - 179 mg/dL    Calcium 9.6 8.7 - 09.6 mg/dL    Albumin 3.9 3.4 - 5.0 g/dL    Total Protein 6.8 5.7 - 8.2 g/dL    Total Bilirubin 1.0 0.3 - 1.2 mg/dL    AST 60 (H) Your liver tests are good <=34 U/L    ALT 34 10 - 49 U/L    Alkaline Phosphatase 62 46 - 116 U/L   CBC w/ Differential    Collection Time: 03/22/21 10:19 AM   Result Value Ref Range    WBC 6.5 3.6 - 11.2 10*9/L    RBC 4.02 3.95 - 5.13 10*12/L    HGB 12.2 11.3 - 14.9 g/dL    HCT 04.5 This is perfect 34.0 - 44.0 %    MCV 88.6 77.6 - 95.7 fL    MCH 30.3 25.9 - 32.4 pg    MCHC 34.2 32.0 - 36.0 g/dL    RDW 40.9 81.1 - 91.4 %    MPV 9.1 6.8 - 10.7 fL    Platelet 344 This is perfect  150 - 450 10*9/L    Neutrophils % 59.0 %    Lymphocytes % 28.9 %    Monocytes % 8.4 %    Eosinophils % 2.8 %    Basophils % 0.9 %    Absolute Neutrophils 3.8 1.8 - 7.8 10*9/L    Absolute Lymphocytes 1.9 1.1 - 3.6 10*9/L    Absolute Monocytes 0.5 0.3 - 0.8 10*9/L    Absolute Eosinophils 0.2 0.0 - 0.5 10*9/L    Absolute Basophils 0.1 0.0 - 0.1 10*9/L

## 2021-04-05 NOTE — Unmapped (Signed)
[facility] Refill Coordination Note    Specialty Medication(s) to be Shipped:   Hematology/Oncology: Courtney Erickson    Other medication(s) to be shipped: No additional medications requested for fill at this time     Courtney Erickson, DOB: 1966-07-03  Phone: [phone] (home)       All above HIPAA information was verified with patient.     Was a Nurse, learning disability used for this call? No    Completed refill call assessment today to schedule patient's medication shipment from the [facility] [phone]).  All relevant notes have been reviewed.     Specialty medication(s) and dose(s) confirmed: Regimen is correct and unchanged.   Changes to medications: Courtney Erickson reports no changes at this time.  Changes to insurance: No  New side effects reported not previously addressed with a pharmacist or physician: None reported  Questions for the pharmacist: No    Confirmed patient received a Conservation officer, historic buildings and a Surveyor, mining with first shipment. The patient will receive a drug information handout for each medication shipped and additional FDA Medication Guides as required.       DISEASE/MEDICATION-SPECIFIC INFORMATION        N/A    SPECIALTY MEDICATION ADHERENCE     Medication Adherence    Patient reported X missed doses in the last month: 0  Specialty Medication: Jakafi  Patient is on additional specialty medications: No              Were doses missed due to medication being on hold? No    [name]: 14 days of medicine on hand        REFERRAL TO PHARMACIST     Referral to the pharmacist: Not needed      [facility]     Shipping address confirmed in Epic.     Delivery Scheduled: Yes, Expected medication delivery date: 04/13/21.     Medication will be delivered via Next Day Courier to the prescription address in Epic [place].    Unk Lightning   [facility] Specialty Technician

## 2021-04-26 NOTE — Unmapped (Unsigned)
Assessment:   Hx gross hematuria s/p (-) work-up in 12/2018  ?IC s/p interstim placement in past removed 2/2 infection  Polycythemia vera  DM2  Active smoker    This is a 55 y.o.-year-old female with a history of gross hematuria who returns in follow-up for bothersome lower urinary tract symptoms of unclear etiology.  Constellation of symptoms are consistent with IC/BPS, although given significant smoking history urothelial carcinoma must remain in the back of our differential.  Reassuringly her UA is without evidence of microhematuria today and she denies recent episodes of gross hematuria.     I reviewed with the patient today her urinary complaints, symptoms and evaluation in the clinic. Her postvoid residual in clinic is 35cc.  Her urinary symptoms appear to improve with mirabegron; however, she again notes running out of the medication.  We discussed the utility of her medication therapy which includes mybetriq. The risk and side effects of the medications were again discussed.  I emphasized to her the importance of continually taking this medication to maximize the benefit.  We also discussed the utility of Elmiron given that she notes having a prior good response to this.  She elected to proceed with treatment and the potential risks/side effects of this medication were discussed as well.  We will plan to see her back in about 3 to 4 months for symptom check if she notes persistently bothersome LUTS we will consider referral for bladder Botox versus PTNS.    Plan:  [ ]  UA reflex culture  [ ]  Continue mirabegron 50 mg daily  [ ]  Resume Elmiron 100 mg 3 times daily either 1 hour before or 2 hours after meals given questionable history of interstitial cystitis  [ ]  Return to clinic in 3-4 months with any available APP for symptom check  [ ]  As needed referral to Dr. Raoul Pitch for discussion of bladder Botox versus PTNS for medication refractory urge predominant LUTS    ===========================================================================================    CHIEF COMPLAINT: History of BPH and bothersome lower urinary tract symptoms here for follow-up.    BRIEF HISTORY OF PRESENT ILLNESS:  Courtney Erickson is very pleasant 55 y.o.-year-old female with past medical history of gross hematuria status post negative evaluation in 2020 and prior InterStim placement removed for infection who is seen in follow-up for the evaluation of urge predominant lower urinary tract symptoms.  She is previously been followed by APP Duffy Rhody and Dr. Raoul Pitch, who recently performed a cystoscopy on her in March 2020.  Last seen by me 12/10/2019 during which time she was noted to have persistent urge predominant voiding symptoms for which she was started on mirabegron 50 mg daily.  Regarding hematuria history her last UA from 12/08/2019 did not reveal any RBCs.  She is a current smoker (1 pack/week x 30 years).  She denies anticoagulant use and does admit to past urinary tract infections.    In the interim she notes that mirabegron helped with urgency but she since ran out and has had a recurrence of her symptoms.  She denies fevers, chills, night sweats, hematuria, pyuria, dysuria, urinary retention and chest pain.  She does note chronic lower back pain and was asking if this could be related to her lower urinary tract symptoms.  Reassuringly her PVR today is 35 cc.  She continues to smoke tobacco.  UA today is without evidence of hematuria and is not suggestive of infection.    PAST MEDICAL HISTORY:     Past Medical History:  Diagnosis Date   ??? Abnormal findings on esophagogastroduodenoscopy (EGD) 12/16/2013    For abdominal discomfort; DDAVP; with EUS; no abnormal findings, and pancreas unremarkable   ??? ANA positive    ??? Anxiety    ??? Asthma    ??? Atony of bladder    ??? Chronic interstitial cystitis    ??? COPD (chronic obstructive pulmonary disease) (CMS-HCC)    ??? Depression    ??? Diabetes (CMS-HCC)    ??? Difficult intravenous access    ??? Dyspareunia    ??? Endometriosis    ??? Female stress incontinence    ??? Gastric ulcer    ??? GERD (gastroesophageal reflux disease)    ??? Gross hematuria    ??? H/O colonoscopy 12/2011   ??? Incomplete bladder emptying    ??? Incontinence without sensory awareness    ??? Other chronic cystitis    ??? Other functional disorder of bladder    ??? Pain medication agreement signed 08/19/2012   ??? Polycythemia vera(238.4)    ??? Scoliosis    ??? Urge incontinence    ??? Urinary frequency    ??? Von Willebrand's disease (CMS-HCC)        PAST SURGICAL HISTORY:   Past Surgical History:   Procedure Laterality Date   ??? APPENDECTOMY     ??? BREAST BIOPSY Right     age 42     benign   ??? HYSTERECTOMY  1999    total   ??? InterStim Device  2003    Bladder Stimulator   ??? OOPHORECTOMY     ??? PR IMPLANT PERIPH/GASTRIC NEUROSTIM/RECEIVER Right 02/06/2015    Procedure: INSERTION OR REPLACEMENT PERIPHERAL/GASTRIC NEUROSTIMULATOR PULSE GENERAT/RECEIVE, DIRECT/INDUCTIV COUPLING;  Surgeon: Smith Robert, MD;  [facility];  Service: Urology   ??? PR REVISE/REMOVE PERIPH/GASTRIC NEUROSTIM/RECEIVER N/A 09/22/2017    Procedure: REVISION OR REMOVAL OF PERIPHERAL OR GASTRIC NEUROSTIMULATOR PULSE GENERATOR OR RECEIVER;  Surgeon: Norval Morton, MD;  Location: [facility] OR [facility];  Service: Urology   ??? PR REVISE/REMOVE PERIPHERAL NEUROELECTRODE N/A 09/22/2017    Procedure: REVISION OR REMOVAL OF PERIPHERAL NEUROSTIMULATOR ELECTRODE ARRAY;  Surgeon: Norval Morton, MD;  Location: [facility] OR [facility];  Service: Urology   ??? ROTATOR CUFF REPAIR Left     x 2   ??? TOTAL KNEE ARTHROPLASTY Left 2006    Given Humate-P       ALLERGIES:    is allergic to lyrica [pregabalin], gabapentin, and tramadol.    MEDICATIONS:  Current Outpatient Medications   Medication Sig Dispense Refill   ??? ADVAIR DISKUS 250-50 mcg/dose diskus Inhale 1 puff 2 (two) times a day.      ??? albuterol HFA 90 mcg/actuation inhaler      ??? aspirin (ECOTRIN) 81 MG tablet Take 1 tablet (81 mg total) by mouth daily. 150 tablet 2   ??? atorvastatin (LIPITOR) 20 MG tablet Take 1 tablet (20 mg total) by mouth daily. 30 tablet 11   ??? dextroamphetamine-amphetamine (ADDERALL) 20 mg tablet TAKE 1 TABLET ONCE A DAY AT 2-3 P.M.     ??? diazePAM (VALIUM) 5 MG tablet TAKE 1 TABLET BY MOUTH AT BEDTIME FOR SLEEP     ??? esomeprazole (NEXIUM) 40 MG capsule Take 40 mg by mouth daily.     ??? fluticasone propionate (FLONASE) 50 mcg/actuation nasal spray SPRAY 1 SPRAY INTO EACH NOSTRIL EVERY DAY 16 mL 0   ??? folic acid (FOLVITE) 1 MG tablet Take 1,000 mcg by mouth daily.     ???  ipratropium-albuterol (DUO-NEB) 0.5-2.5 mg/3 mL nebulizer Inhale 3 mL by nebulization every six (6) hours as needed. 360 mL 11   ??? isosorbide mononitrate (IMDUR) 30 MG 24 hr tablet Take 30 mg by mouth daily.      ??? propranoloL (INDERAL) 40 MG tablet Take 1 tablet by mouth daily.     ??? ruxolitinib (JAKAFI) 15 mg tablet Take 1 tablet (15 mg total) by mouth Two (2) times a day. 60 tablet 5   ??? SPIRIVA WITH HANDIHALER 18 mcg inhalation capsule INHALE 1 CAP INHALATION ONCE DAILY     ??? venlafaxine (EFFEXOR-XR) 150 MG 24 hr capsule TAKE 2 CAPSULES BY MOUTH EVERY MORNING     ??? VYVANSE 70 mg capsule Take 1 capsule by mouth daily.        No current facility-administered medications for this visit.        SOCIAL HISTORY:  Social History     Tobacco Use   ??? Smoking status: Current Some Day Smoker     Packs/day: 0.50     Years: 30.00     Pack years: 15.00     Types: Cigarettes     Start date: 05/13/1999     Last attempt to quit: 11/11/2016     Years since quitting: 4.4   ??? Smokeless tobacco: Never Used   ??? Tobacco comment: Quit but started back   Vaping Use   ??? Vaping Use: Never used   Substance Use Topics   ??? Alcohol use: No     Alcohol/week: 0.0 standard drinks     Comment: denies   ??? Drug use: No        FAMILY HISTORY: mother with bladder cancer    Family History   Problem Relation Age of Onset   ??? Hyperlipidemia Sister    ??? GU problems Mother    ??? Cancer Mother 28        Bladder cancer   ??? Cancer Father 65        Colon cancer   ??? Prostate cancer Neg Hx    ??? Kidney cancer Neg Hx    ??? Urolithiasis Neg Hx        REVIEW OF SYSTEMS:  A 10-system review of systems was completed by the patient and reviewed by me today.  All pertinent positives were discussed in history of present illness.    PHYSICAL EXAMINATION:  VITAL SIGNS:  The patient is afebrile.  Vital signs are stable.  GENERAL:  In no apparent distress; strong smell of tobacco in the room.  HEENT:  Eyes, ears and mouth appeared normal (mask intermittently falling off face).  LYMPHATICS:  No cervical lymphadenopathy.  LUNGS:  Chest wall excursion symmetric bilaterally. No audible wheezing  ABDOMEN:  Soft, nontender, nondistended.  BACK:  Negative CVA tenderness.  EXTREMITIES:  No lower extremity edema.  SKIN:  No rashes or jaundice.  NEUROLOGIC:  No focal deficits on neurologic exam.    LABORATORY TESTING:      Chemistry        Component Value Date/Time    NA 139 03/22/2021 1019    NA 141 01/27/2015 1056    K 4.2 03/22/2021 1019    K 4.6 01/27/2015 1056    CL 107 03/22/2021 1019    CL 105 01/27/2015 1056    CO2 29.0 03/22/2021 1019    CO2 27 01/27/2015 1056    BUN 9 03/22/2021 1019    BUN 7 01/27/2015 1056    CREATININE 0.79 03/22/2021  1019    CREATININE 0.80 01/27/2015 1056    GLU 119 03/22/2021 1019        Component Value Date/Time    CALCIUM 9.6 03/22/2021 1019    CALCIUM 9.6 01/27/2015 1056    ALKPHOS 62 03/22/2021 1019    AST 60 (H) 03/22/2021 1019    ALT 34 03/22/2021 1019    BILITOT 1.0 03/22/2021 1019              PSA trend:  No results found for: PSA, PSADIAG, PSASCRN, PSAFREE, PSATOT    PVR: 35cc    Candace Cruise. Dow Adolph, MD MPH  Assistant Professor  Department of Urology   [facility]  [id number]  [place], [place] [address]  p [phone] f [phone]

## 2021-04-27 ENCOUNTER — Ambulatory Visit: Admit: 2021-04-27 | Payer: MEDICARE

## 2021-05-02 NOTE — Unmapped (Signed)
[facility] Refill Coordination Note    Specialty Medication(s) to be Shipped:   Hematology/Oncology: Courtney Erickson    Other medication(s) to be shipped: No additional medications requested for fill at this time     Courtney Erickson, DOB: April 27, 1966  Phone: [phone] (home)       All above HIPAA information was verified with patient.     Was a Nurse, learning disability used for this call? No    Completed refill call assessment today to schedule patient's medication shipment from the [facility] [phone]).  All relevant notes have been reviewed.     Specialty medication(s) and dose(s) confirmed: Regimen is correct and unchanged.   Changes to medications: Yasira reports no changes at this time.  Changes to insurance: No  New side effects reported not previously addressed with a pharmacist or physician: None reported  Questions for the pharmacist: No    Confirmed patient received a Conservation officer, historic buildings and a Surveyor, mining with first shipment. The patient will receive a drug information handout for each medication shipped and additional FDA Medication Guides as required.       DISEASE/MEDICATION-SPECIFIC INFORMATION        N/A    SPECIALTY MEDICATION ADHERENCE     Medication Adherence    Patient reported X missed doses in the last month: 0  Specialty Medication: [name]  Patient is on additional specialty medications: No  Informant: patient              Were doses missed due to medication being on hold? No    [name]: 14 days of medicine on hand       REFERRAL TO PHARMACIST     Referral to the pharmacist: Not needed      [facility]     Shipping address confirmed in Epic.     Delivery Scheduled: Yes, Expected medication delivery date: 05/17/21.     Medication will be delivered via Next Day Courier to the prescription address in Epic [place].    Courtney Erickson   [facility] Specialty Technician

## 2021-05-16 MED FILL — JAKAFI 15 MG TABLET: ORAL | 30 days supply | Qty: 60 | Fill #1

## 2021-06-12 NOTE — Unmapped (Signed)
[facility] Clinical Assessment & Refill Coordination Note    Courtney Erickson, DOB: 29-Nov-1965  Phone: [phone] (home)     All above HIPAA information was verified with patient.     Was a Nurse, learning disability used for this call? No    Specialty Medication(s):   Hematology/Oncology: [id number]     Current Outpatient Medications   Medication Sig Dispense Refill   ??? ADVAIR DISKUS 250-50 mcg/dose diskus Inhale 1 puff 2 (two) times a day.      ??? albuterol HFA 90 mcg/actuation inhaler      ??? aspirin (ECOTRIN) 81 MG tablet Take 1 tablet (81 mg total) by mouth daily. 150 tablet 2   ??? atorvastatin (LIPITOR) 20 MG tablet Take 1 tablet (20 mg total) by mouth daily. 30 tablet 11   ??? dextroamphetamine-amphetamine (ADDERALL) 20 mg tablet TAKE 1 TABLET ONCE A DAY AT 2-3 P.M.     ??? diazePAM (VALIUM) 5 MG tablet TAKE 1 TABLET BY MOUTH AT BEDTIME FOR SLEEP     ??? esomeprazole (NEXIUM) 40 MG capsule Take 40 mg by mouth daily.     ??? fluticasone propionate (FLONASE) 50 mcg/actuation nasal spray SPRAY 1 SPRAY INTO EACH NOSTRIL EVERY DAY 16 mL 0   ??? folic acid (FOLVITE) 1 MG tablet Take 1,000 mcg by mouth daily.     ??? ipratropium-albuterol (DUO-NEB) 0.5-2.5 mg/3 mL nebulizer Inhale 3 mL by nebulization every six (6) hours as needed. 360 mL 11   ??? isosorbide mononitrate (IMDUR) 30 MG 24 hr tablet Take 30 mg by mouth daily.      ??? propranoloL (INDERAL) 40 MG tablet Take 1 tablet by mouth daily.     ??? ruxolitinib (JAKAFI) 15 mg tablet Take 1 tablet (15 mg total) by mouth Two (2) times a day. 60 tablet 5   ??? SPIRIVA WITH HANDIHALER 18 mcg inhalation capsule INHALE 1 CAP INHALATION ONCE DAILY     ??? venlafaxine (EFFEXOR-XR) 150 MG 24 hr capsule TAKE 2 CAPSULES BY MOUTH EVERY MORNING     ??? VYVANSE 70 mg capsule Take 1 capsule by mouth daily.        No current facility-administered medications for this visit.        Changes to medications: Courtney Erickson reports no changes at this time.    Allergies   Allergen Reactions   ??? Lyrica [Pregabalin] Other (See Comments)     Unsteady gate, causing falls   ??? Gabapentin Nausea And Vomiting   ??? Tramadol Rash       Changes to allergies: No    SPECIALTY MEDICATION ADHERENCE     [name]: 14 days of medicine on hand     Medication Adherence    Patient reported X missed doses in the last month: 0  Specialty Medication: [name] tablets - 1 tab (15 mg) twice daily  Patient is on additional specialty medications: No  Informant: patient  Confirmed plan for next specialty medication refill: delivery by pharmacy  Refills needed for supportive medications: not needed          Specialty medication(s) dose(s) confirmed: Regimen is correct and unchanged.     Are there any concerns with adherence? No    Adherence counseling provided? Not needed    CLINICAL MANAGEMENT AND INTERVENTION      Clinical Benefit Assessment:    Do you feel the medicine is effective or helping your condition? Yes    Clinical Benefit counseling provided? Not needed  Adverse Effects Assessment:    Are you experiencing any side effects? No    Are you experiencing difficulty administering your medicine? No    Quality of Life Assessment:    Quality of Life    Rheumatology  On a scale of 1 - 10 with 1 representing not at all and 10 representing completely - how has your rheumatologic condition affected your:  Oncology  Dermatology          How many days over the past month did your Polycythemia vera  keep you from your normal activities? For example, brushing your teeth or getting up in the morning. 0    Have you discussed this with your provider? Not needed    Acute Infection Status:    Acute infections noted within Epic:  No active infections  Patient reported infection: None    Therapy Appropriateness:    Is therapy appropriate? Yes, therapy is appropriate and should be continued    DISEASE/MEDICATION-SPECIFIC INFORMATION      N/A    PATIENT SPECIFIC NEEDS     - Does the patient have any physical, cognitive, or cultural barriers? No    - Is the patient high risk? No    - Does the patient require a Care Management Plan? No     - Does the patient require physician intervention or other additional services (i.e. nutrition, smoking cessation, social work)? No      SHIPPING     Specialty Medication(s) to be Shipped:   Hematology/Oncology: Jakafi    Other medication(s) to be shipped: No additional medications requested for fill at this time     Changes to insurance: No    Delivery Scheduled: Yes, Expected medication delivery date: 06/22/21.     Medication will be delivered via Next Day Courier to the confirmed prescription address in [facility].    The patient will receive a drug information handout for each medication shipped and additional FDA Medication Guides as required.  Verified that patient has previously received a Conservation officer, historic buildings and a Surveyor, mining.    The patient or caregiver noted above participated in the development of this care plan and knows that they can request review of or adjustments to the care plan at any time.      All of the patient's questions and concerns have been addressed.    Courtney Erickson [facility] Specialty Pharmacist

## 2021-06-20 ENCOUNTER — Encounter: Admit: 2021-06-20 | Payer: MEDICARE

## 2021-06-20 ENCOUNTER — Encounter: Admit: 2021-06-20 | Payer: MEDICARE | Attending: Adult Health | Primary: Adult Health

## 2021-06-20 ENCOUNTER — Encounter: Admit: 2021-06-20 | Payer: MEDICARE | Attending: Hematology & Oncology | Primary: Hematology & Oncology

## 2021-06-21 ENCOUNTER — Encounter
Admit: 2021-06-21 | Discharge: 2021-06-22 | Payer: MEDICARE | Attending: Physician Assistant | Primary: Physician Assistant

## 2021-06-21 DIAGNOSIS — F32A Depression, unspecified depression type: Principal | ICD-10-CM

## 2021-06-21 DIAGNOSIS — F419 Anxiety disorder, unspecified: Principal | ICD-10-CM

## 2021-06-21 DIAGNOSIS — F909 Attention-deficit hyperactivity disorder, unspecified type: Principal | ICD-10-CM

## 2021-06-21 DIAGNOSIS — E669 Obesity, unspecified: Principal | ICD-10-CM

## 2021-06-21 DIAGNOSIS — R29818 Other symptoms and signs involving the nervous system: Principal | ICD-10-CM

## 2021-06-21 DIAGNOSIS — R519 Sleep related headaches: Principal | ICD-10-CM

## 2021-06-21 DIAGNOSIS — E109 Type 1 diabetes mellitus without complications: Principal | ICD-10-CM

## 2021-06-21 DIAGNOSIS — G478 Other sleep disorders: Principal | ICD-10-CM

## 2021-06-21 DIAGNOSIS — R61 Generalized hyperhidrosis: Principal | ICD-10-CM

## 2021-06-21 DIAGNOSIS — E1142 Type 2 diabetes mellitus with diabetic polyneuropathy: Principal | ICD-10-CM

## 2021-06-21 DIAGNOSIS — F319 Bipolar disorder, unspecified: Principal | ICD-10-CM

## 2021-06-21 DIAGNOSIS — R351 Nocturia: Principal | ICD-10-CM

## 2021-06-21 DIAGNOSIS — F458 Other somatoform disorders: Principal | ICD-10-CM

## 2021-06-21 DIAGNOSIS — N3010 Interstitial cystitis (chronic) without hematuria: Principal | ICD-10-CM

## 2021-06-21 DIAGNOSIS — G4700 Insomnia, unspecified: Principal | ICD-10-CM

## 2021-06-21 DIAGNOSIS — R0683 Snoring: Principal | ICD-10-CM

## 2021-06-21 MED FILL — JAKAFI 15 MG TABLET: ORAL | 30 days supply | Qty: 60 | Fill #2

## 2021-06-21 NOTE — Unmapped (Addendum)
Sleep:  Change the timing of your Adderall to the morning time  Make sure that you take your Trintellix, Effexor and Vyvanse in the morning only  Avoid television within 1 hour for bedtime  Change your [facility] to caffeine free [facility]  Make sure that you follow-up at [facility] to ensure that your mental health needs are met    Sleep apnea:  You were previously evaluated with a sleep study many years ago.  I do suspect sleep apnea and you will need to have your study repeated.  Remember that sleep apnea is important to diagnose and treat as it is an independent risk factor for cardiovascular disease, including but not limited to hypertension, difficult to treat hypertension, arrythmias, congestive heart failure, heart attacks, stroke and increased mortality.  I have the phone number listed below for the sleep lab and would recommend that you give them a call rather than waiting for them to call you.  Generally, I will discuss your results with you a couple of weeks after the study has been completed.      [place] sleep lab patient line:    [phone]      [facility] neurology sleep clinic patient line:    [phone]     Patient education: Sleep apnea in adults (Beyond the Basics)  Author:  Truitt Leep, MD  Section Editor:  Eliezer Lofts, MD  Deputy Editor:  Gwen Her, MD  Contributor Disclosures  All topics are updated as new evidence becomes available and our peer review process is complete.  Literature review current through: Oct 2021. - This topic last updated: May 04, 2020.    INTRODUCTION -- Normally during sleep, air moves in and out through the nose, throat, and lungs at a regular rhythm. In a person with sleep apnea, air movement is periodically reduced or stopped. There are two types of sleep apnea, obstructive sleep apnea and central (non-obstructive) sleep apnea; some forms of apnea involve both types (a ???mixed??? apnea). For both types of sleep apnea, there is a reduction in breathing. In obstructive sleep apnea, breathing becomes abnormal because of narrowing or closure of the throat. In central sleep apnea, breathing is abnormal because of a change in the breathing control and rhythm, but the throat remains open.  Sleep apnea is a serious condition that can affect sleep satisfaction and quality, alertness and efficiency while awake, and the ability to safely drive a motor vehicle; it can also impact long term health. Approximately 25 percent of adults are at risk for sleep apnea of some degree [1]. Males are more commonly affected than females, but after menopause it is more equal. Other risk factors include middle and older age, being overweight or obese, and having a small mouth and throat.  This topic review focuses on the most common type of sleep apnea in adults, obstructive sleep apnea (OSA).  HOW SLEEP APNEA OCCURS -- The throat is surrounded by muscles that control the airway for speaking, swallowing, and breathing. These muscles hang from the skull and jaw and surround a flexible tube (the main airway that brings air to the lungs). During sleep, muscles are less active, which can cause the throat to narrow (figure 1). In most people, this narrowing does not affect breathing. In others, it can cause snoring, sometimes with reduced or completely blocked airflow (figure 2). A completely blocked airway without airflow is called an obstructive apnea. Partial obstruction with diminished airflow is called a hypopnea. A person may have  both sleep apnea and hypopnea.  Insufficient breathing due to apnea or hypopnea causes oxygen levels to fall and carbon dioxide levels to rise. Because the airway is blocked, breathing faster or harder does not help to improve oxygen levels until the airway is reopened. Typically, the obstruction requires the person to briefly awaken to activate upper airway muscles. Once the airway is opened, the person then takes several deep breaths to catch up on breathing. As the person awakens, he or she may move briefly, snort, or loudly snore. Less frequently, a person may awaken completely with a sensation of gasping, smothering, or choking.  If the person falls back to sleep quickly, he or she will not remember the event. Many people with sleep apnea are unaware of their abnormal breathing in sleep, and all patients underestimate how often their sleep is interrupted. Awakening from sleep causes sleep to be unrefreshing and causes a sense of fatigue and wake time sleepiness.  Anatomic causes of obstructive sleep apnea -- Some patients have OSA because of a small upper airway. As the bones of the face and skull develop, some people develop a small lower face, a small mouth, and a tongue that seems too large for the mouth. These features are largely genetically determined, which explains why OSA tends to cluster in some families. Obesity also increases the risk of airway closure. Tonsil enlargement can be an important cause, especially in children. While these factors increase the risk of sleep apnea, they are not likely to cause noticeable symptoms or problems while the person is awake.  SLEEP APNEA SYMPTOMS -- The major symptoms of OSA are loud snoring, fatigue, and feeling sleepy during the day (or whenever the person is normally awake). However, some people have no symptoms. For example, if the person does not have a bed partner, they may not be aware of the snoring. Fatigue and sleepiness have many causes and are often attributed to overwork and increasing age. As a result, it may take time for a person to recognize that they have a problem. A bed partner or spouse often prompts the person to seek medical attention (eg, for pauses, snorts, and snoring during sleep).  Other symptoms may include one or more of the following:  ?Restless sleep  ?Awakening with choking, gasping, or smothering  ?Morning headaches, dry mouth, or sore throat  ?Waking frequently to urinate  ?Awakening unrested, groggy  ?Low energy, difficulty concentrating, memory impairment  Risk factors -- Certain factors increase the risk of sleep apnea.  ?Increasing age - OSA occurs at all ages, but it is more common in middle and older age adults.  ?Female sex and hormones - OSA is twice as common in males as females, especially in middle aged males and in those on replacement hormones.  ?Obesity - The more obese a person is, the more likely he or she is to have OSA.  ?Sedation from medication or alcohol - These reduce breathing and prevent awakening during sleep, and can lengthen periods of apnea (no breathing), with potentially dangerous consequences.  ?Abnormality of the airway that narrows it (eg, large tonsils).    SLEEP APNEA HEALTH CONSEQUENCES -- Complications of sleep apnea can include reduced alertness, difficulty concentrating, and sleepiness. The consequence is an increased risk of crashes, accidents, and errors. Studies have shown that people with severe OSA are more than twice as likely to be involved in a motor vehicle accident as people without sleep apnea. People with OSA are encouraged to recognize this risk  and discuss options for driving, working, and performing other high-risk tasks with a healthcare provider.  In addition, people with untreated OSA may have an increased risk or worsened control of cardiovascular problems such as high blood pressure, heart attack, abnormal heart rhythms, or stroke [2]. This risk may be due to changes in the heart rate and blood pressure that occur during sleep.    SLEEP APNEA DIAGNOSIS -- The diagnosis of OSA and a plan to manage it is best made by a knowledgeable sleep medicine specialist who has an understanding of the individual's health issues. The diagnosis is usually based upon the person's medical history, physical examination, and testing, including:  ?A complaint of snoring and ineffective sleep  ?Neck size (greater than 17 inches in men or 16 inches in women) is associated with an increased risk of sleep apnea  ?A small upper airway: difficulty seeing the throat because of a tongue that is large for the mouth  ?High blood pressure, especially if it is resistant to treatment  ?If a bed partner has observed the patient during episodes of stopped breathing (apnea), choking, or gasping during sleep, there is a strong possibility of sleep apnea  An overnight sleep study is called a polysomnogram. The polysomnogram measures the breathing effort and airflow, blood oxygen level, heart rate and rhythm, duration of the various stages of sleep, body position, and movement of the arms/legs.  At-home devices are available that monitor breathing, oxygen saturation, position, and heart rate, but not sleep itself. Home monitoring is a reasonable alternative to conventional testing in a sleep laboratory if the clinician strongly suspects moderate or severe sleep apnea and the patient does not have other illnesses or sleep disorders that may interfere with interpretation of the results.    SLEEP APNEA TREATMENT -- The goal of treatment is to maintain an open airway during sleep. Effective treatment will eliminate the symptoms of sleep disturbance; long-term health consequences are also reduced. Most treatments require nightly use. The challenge for the clinician and the patient is to select an effective therapy that is appropriate for the patient's problem and that is acceptable for long term use.    Continuous positive airway pressure (CPAP) -- The most effective predictable, and commonly used treatment for sleep apnea uses air pressure from a mechanical device to keep the upper airway open during sleep. A CPAP device (figure 3) uses an air-tight attachment to the nose, typically a mask, connected to a tube and a blower which generates the pressure [3]. Devices should fit comfortably into the nasal opening, or over the nose or nose and mouth. CPAP should be used any time the person sleeps (day or night).  The CPAP device can be started in the sleep lab, where a technician can adjust the pressure and select the best equipment to keep the airway open. Alternatively, an auto device with a self-adjusting pressure feature, provided with proper education and training, can get treatment started without another sleep test. CPAP devices are now relatively quiet, and having a comfortable mask fit is key, but most people will accept the treatment if it improves their symptoms. However, difficulty with mask comfort and/or nasal congestion result in a reduction of regular use to only 50 percent of people after two years.    Continued follow-up with a healthcare provider helps promote effective treatment as technology improves. Information from the CPAP machine is often used by you, physicians, therapists, and insurers to track the success of treatment. CPAP can be delivered  with different features to improve comfort and solve problems that may come up during treatment. Changes in treatment may be needed if symptoms do not improve or if the person's condition changes, such as a gain or loss of weight.    Behavior and lifestyle changes -- Most people with OSA can benefit from certain behavior changes.    Changing sleep position -- Adjusting sleep position (to stay off the back) may help improve sleep quality in people who have OSA when sleeping on the back. However, this is difficult to maintain throughout the night and is rarely an adequate solution.    Weight loss -- Weight loss is very helpful for people who are obese or overweight. Weight loss through dietary changes, exercise, and/or surgical treatment is equally effective. However, it can be difficult to maintain weight loss; the five-year success of non-surgical weight loss is only 5 percent, meaning that 95 percent of people regain lost weight. (See Patient education: Losing weight (Beyond the Basics).)    Avoiding alcohol and other sedatives -- Alcohol can worsen sleepiness, increasing the risk of accidents or injury. People with OSA are often counseled to drink little to no alcohol, even during the daytime. Similarly, people who take anti-anxiety medications or sedatives to sleep should speak with their healthcare provider about the impact of these medications on sleep apnea.  If you have OSA, you will need to notify other healthcare providers, including surgeons, about your condition and the potential risks of being sedated. People with OSA who are given perioperative anesthesia and/or pain medications require special management and close monitoring to reduce the risk of a blocked airway.    Other treatments -- While behavioral changes and CPAP are typically recommended as initial therapy for people with OSA, other treatments may be used in some situations.     Appliances -- An oral appliance (or mandibular advancement device) can reposition the jaw, bringing the tongue and soft palate forward to relieve obstruction in some people [4].  Oral appliances work very well to reduce snoring, although the effect on OSA is sometimes more limited [4]. As a result, they are best used for mild cases of OSA when relief of snoring is the main goal. While oral appliances are not as effective as CPAP for OSA, they may be an alternative for people who cannot tolerate (or choose not to have) CPAP. Side effects of oral appliances are generally minor but may include changes to the bite with prolonged use.    Other devices that aim to reduce snoring and improve sleep are also available over the counter or by prescription. These include strips that are placed over the nose or nostrils with the goal of helping keep the airway open. While some people find these devices helpful, there is limited evidence for their efficacy in treating sleep apnea. If you are interested in trying one of these devices, be sure to read all labels and instructions carefully, and discuss it your health care provider first.    Upper airway surgery -- Surgery is an alternative for patients who cannot tolerate or do not improve with nonsurgical treatments. Surgery can also be used in combination with other nonsurgical treatments.    Surgical procedures reshape structures in the upper airways or surgically reposition bone or soft tissue. Uvulopalatopharyngoplasty (UPPP) removes the uvula and excessive tissue in the throat, including the tonsils, if present. Other procedures, such as maxillomandibular advancement (MMA), address both the upper and lower pharyngeal airway more globally.  UPPP alone  has limited success rates (less than 50 percent) and people can relapse (when OSA symptoms return after surgery) [5]. As a result, this surgery is only recommended in a minority of people and should be considered with caution. MMA may have a higher success rate, particularly in people with abnormal jaw (maxilla and mandible) anatomy, but it is a complicated procedure.     Tracheostomy creates a permanent opening in the neck. It is reserved for people with severe disease in whom less drastic measures have failed or are inappropriate. Although successful in eliminating obstructive sleep apnea, tracheostomy requires significant lifestyle changes and carries some serious risks (eg, infection, bleeding, blockage).  There is increasing experience with a surgical approach that involves nerve stimulation to prevent the upper airway from closing during sleep. While there are specific criteria a person must meet in order to be a candidate for this procedure, it can sometimes help if other treatments have not been effective.   All surgical treatments require discussions about the goals of treatment, the expected outcomes, and potential complications.

## 2021-06-21 NOTE — Unmapped (Signed)
CLINIC NOTE   Sleep Clinic, Neurology  [facility]      Date of Service: 06/21/2021     Patient Name: Courtney Erickson       MRN: [id number]       Date of Birth: 1966/01/09  Primary Care Physician: Carron Brazen, MD  Referring Provider: Leotis Pain, Herbie Baltimore*    Assessment and Plan:   Impression:   [name] is a 55 y.o. female with ADHD, asthma, atony of bladder, bipolar disorder, chronic interstitial cystitis, chronic pain syndrome, COPD, dyspareunia, endometriosis, female stress incontinence, gastric ulcer, GERD, gross hematuria, incomplete bladder emptying, incontinence without sensory awareness, insomnia, other chronic cystitis, other functional disorder of bladder, polycythemia vera, scoliosis, urge incontinence, urinary frequency, von Willebrand's disease and was referred secondary to sleep difficulties.    Suspected sleep apnea: Courtney Erickson was previously evaluated in [facility] via sleep study about 20 years ago.  It is unclear what the result was but she appears to be symptomatic.  She has known snoring, wakes up coughing/gasping/short of breath, has fragmented and nonrestorative sleep and is obese.  I suspect sleep apnea and I spoke at length with regards to the pathophysiology of both central and obstructive sleep apnea.  I also discussed that untreated sleep apnea is a independent risk factor for cardiovascular disease including but not limited to, hypertension, arrhythmias, MI, stroke, increased mortality.  I also discussed at length how sleep apnea is diagnosed and what a sleep study is.  I further discussed treatment options for sleep apnea including positional therapy, oral appliance, surgery, and Pap therapy.  All questions were answered.  ?? Polysomnography ordered    Insomnia: This is primarily managed by her psychiatrist at [facility] but did make some recommendations to improve things.  ??? Change the timing of your Adderall to the morning time  ??? Make sure that you take your Trintellix, Effexor and Vyvanse in the morning only  ??? Avoid television within 1 hour for bedtime  ??? Change your [facility] to caffeine free [facility]  ??? Make sure that you follow-up at [facility] to ensure that your mental health needs are met    I personally spent 65 minutes face-to-face and non-face-to-face in the care of this patient, which includes all pre, intra, and post visit time on the date of service.      Subjective:     Chief Complaint: Sleeping difficulties    History of Present Illness:     Courtney Erickson is a 55 y.o. female seen for sleeping difficulties at the request of  Leonard Downing* for an opinion regarding potential etiologies, diagnostic work-up that should be considered, and/or recommendations for treatment options.      Courtney Erickson was referred secondary to concerns of her difficulty with sleep.  She is currently under the care of a provider at [facility] in [place].  She reports having bipolar disorder, anxiety/depression, ADHD as well as insomnia.  She is currently taking Trintellix, duloxetine, and Vyvanse in the morning (she did not take her morning meds as of yet today) and takes Adderall 20 mg around 2 PM-3 PM.  For sleep, she takes 5 mg of diazepam.  She does not have a set bedtime as she sleeps in her recliner and tends to try to sleep around midnight.  It takes her 1.5 hours to fall asleep but sometimes longer.  She feels that she wakes up every 30 minutes to an hour.  She rises for the day about 6 AM without an alarm.  She does not feel refreshed and feels just as bad waking up as when she went to sleep.  She does not sleep in her bed because of the memories of her husband passing away there, 3 years ago.  She denies any sleepiness during the daytime, napping or dozing.  She does have a history of snoring, wakes up coughing/gasping for air.  She will occasionally wake up to go to the bathroom.  Sometimes she wakes up and will take a walk down the hall but usually when she wakes up in the middle of the night she falls back asleep pretty easily.  She does have episodes of sleep paralysis and this occurs 3-4 times a week.  She does have a history of bruxism and has had all her teeth removed.  She has night sweats as well.  She denies any sleep-related hallucinations, history of sleepwalking or talking.  She does not have RLS but does have cramping of her arms and legs.  She does have peripheral neuropathy and numbness in her feet.  Caffeine is typically 1-2 servings of [facility] usually by 3 PM.  She denies any fluids even water after 3 PM.  She denies any alcohol.  She quit smoking 2 years ago.  She does not utilize any over-the-counter sleep aids.    Review of Systems:  A 14-system review was completed and found to be negative except for what is mentioned in the HPI.     PMH:  Past Medical History:   Diagnosis Date   ??? Abnormal findings on esophagogastroduodenoscopy (EGD) 12/16/2013    For abdominal discomfort; DDAVP; with EUS; no abnormal findings, and pancreas unremarkable   ??? Adult ADHD    ??? ANA positive    ??? Anxiety    ??? Asthma    ??? Atony of bladder    ??? Bipolar disorder (CMS-HCC)    ??? Chronic interstitial cystitis    ??? Chronic pain syndrome    ??? COPD (chronic obstructive pulmonary disease) (CMS-HCC)    ??? Depression    ??? Diabetes (CMS-HCC)    ??? Difficult intravenous access    ??? Dyspareunia    ??? Endometriosis    ??? Female stress incontinence    ??? Gastric ulcer    ??? GERD (gastroesophageal reflux disease)    ??? Gross hematuria    ??? H/O colonoscopy 12/2011   ??? Incomplete bladder emptying    ??? Incontinence without sensory awareness    ??? Insomnia    ??? Other chronic cystitis    ??? Other functional disorder of bladder    ??? Pain medication agreement signed 08/19/2012   ??? Polycythemia vera(238.4)    ??? Scoliosis    ??? Urge incontinence    ??? Urinary frequency    ??? Von Willebrand's disease (CMS-HCC)        Past Surgical Hx:  Past Surgical History:   Procedure Laterality Date   ??? APPENDECTOMY     ??? BREAST BIOPSY Right     age 49     benign   ??? HYSTERECTOMY  1999    total   ??? InterStim Device  2003    Bladder Stimulator   ??? OOPHORECTOMY     ??? PR IMPLANT PERIPH/GASTRIC NEUROSTIM/RECEIVER Right 02/06/2015    Procedure: INSERTION OR REPLACEMENT PERIPHERAL/GASTRIC NEUROSTIMULATOR PULSE GENERAT/RECEIVE, DIRECT/INDUCTIV COUPLING;  Surgeon: Smith Robert, MD;  Location: [facility];  Service: Urology   ???  PR REVISE/REMOVE PERIPH/GASTRIC NEUROSTIM/RECEIVER N/A 09/22/2017    Procedure: REVISION OR REMOVAL OF PERIPHERAL OR GASTRIC NEUROSTIMULATOR PULSE GENERATOR OR RECEIVER;  Surgeon: Norval Morton, MD;  Location: [facility];  Service: Urology   ??? PR REVISE/REMOVE PERIPHERAL NEUROELECTRODE N/A 09/22/2017    Procedure: REVISION OR REMOVAL OF PERIPHERAL NEUROSTIMULATOR ELECTRODE ARRAY;  Surgeon: Norval Morton, MD;  Location: [facility] OR [facility];  Service: Urology   ??? ROTATOR CUFF REPAIR Left     x 2   ??? TOTAL KNEE ARTHROPLASTY Left 2006    Given Humate-P         FamHx:  Family History   Problem Relation Age of Onset   ??? Hyperlipidemia Sister    ??? GU problems Mother    ??? Cancer Mother 84        Bladder cancer   ??? Cancer Father 58        Colon cancer   ??? Prostate cancer Neg Hx    ??? Kidney cancer Neg Hx    ??? Urolithiasis Neg Hx        Social History:  Social History     Socioeconomic History   ??? Marital status: Single   Tobacco Use   ??? Smoking status: Former Smoker     Packs/day: 0.50     Years: 30.00     Pack years: 15.00     Types: Cigarettes     Start date: 05/13/1999     Quit date: 11/11/2016     Years since quitting: 4.6   ??? Smokeless tobacco: Never Used   ??? Tobacco comment: Quit but started back   Vaping Use   ??? Vaping Use: Never used   Substance and Sexual Activity   ??? Alcohol use: No     Alcohol/week: 0.0 standard drinks     Comment: denies   ??? Drug use: No   ??? Sexual activity: Not Currently         Medications:    Current Outpatient Medications:   ???  ADVAIR DISKUS 250-50 mcg/dose diskus, Inhale 1 puff 2 (two) times a day. , Disp: , Rfl:   ???  albuterol HFA 90 mcg/actuation inhaler, , Disp: , Rfl:   ???  aspirin (ECOTRIN) 81 MG tablet, Take 1 tablet (81 mg total) by mouth daily., Disp: 150 tablet, Rfl: 2  ???  atorvastatin (LIPITOR) 20 MG tablet, Take 1 tablet (20 mg total) by mouth daily., Disp: 30 tablet, Rfl: 11  ???  dextroamphetamine-amphetamine (ADDERALL) 20 mg tablet, TAKE 1 TABLET ONCE A DAY AT 2-3 P.M., Disp: , Rfl:   ???  diazePAM (VALIUM) 5 MG tablet, TAKE 1 TABLET BY MOUTH AT BEDTIME FOR SLEEP, Disp: , Rfl:   ???  esomeprazole (NEXIUM) 40 MG capsule, Take 40 mg by mouth daily., Disp: , Rfl:   ???  fluticasone propionate (FLONASE) 50 mcg/actuation nasal spray, SPRAY 1 SPRAY INTO EACH NOSTRIL EVERY DAY, Disp: 16 mL, Rfl: 0  ???  folic acid (FOLVITE) 1 MG tablet, Take 1,000 mcg by mouth daily., Disp: , Rfl:   ???  ipratropium-albuterol (DUO-NEB) 0.5-2.5 mg/3 mL nebulizer, Inhale 3 mL by nebulization every six (6) hours as needed., Disp: 360 mL, Rfl: 11  ???  phenazopyridine (PYRIDIUM) 100 MG tablet, Take 100 mg by mouth., Disp: , Rfl:   ???  propranoloL (INDERAL) 40 MG tablet, Take 1 tablet by mouth daily., Disp: , Rfl:   ???  ruxolitinib (JAKAFI) 15 mg tablet, Take 1 tablet (15 mg  total) by mouth Two (2) times a day., Disp: 60 tablet, Rfl: 5  ???  SPIRIVA WITH HANDIHALER 18 mcg inhalation capsule, INHALE 1 CAP INHALATION ONCE DAILY, Disp: , Rfl:   ???  TRINTELLIX 20 mg tablet, TAKE 1 TABLET BY MOUTH EVERY DAY IN THE MORNING, Disp: , Rfl:   ???  venlafaxine (EFFEXOR-XR) 150 MG 24 hr capsule, TAKE 2 CAPSULES BY MOUTH EVERY MORNING, Disp: , Rfl:   ???  VYVANSE 70 mg capsule, Take 1 capsule by mouth daily. , Disp: , Rfl:   ???  cyclobenzaprine (FLEXERIL) 10 MG tablet, , Disp: , Rfl:   ???  isosorbide mononitrate (IMDUR) 30 MG 24 hr tablet, Take 30 mg by mouth daily.  (Patient not taking: Reported on 06/21/2021), Disp: , Rfl:       Allergies:  Allergies   Allergen Reactions   ??? Lyrica [Pregabalin] Other (See Comments)     Unsteady gate, causing falls   ??? Gabapentin Nausea And Vomiting   ??? Tramadol Rash            Objective:     Physical Exam:     Vitals:    06/21/21 0757   BP: 141/82   BP Site: R Arm   BP Position: Sitting   BP Cuff Size: Medium   Pulse: 78   Weight: 71.5 kg (157 lb 9.6 oz)     Body mass index is 34.1 kg/m??.      Awake alert appropriate  EOMI, occasional horizontal nystagmus  Tongue midline  Modified Mallampati score IV  Edentulous  Face symmetric  Clear speech  Follows commands  Moves all extremities spontaneously and antigravity  No pronator drift  Antalgic gait    Epworth Sleepiness Scale: 0      Data/ Lab/ Inventory Review:   Polysomnogram results as above  Blood/ serum laboratory tests  Ferritin   Date Value Ref Range Status   03/22/2021 49.0 7.3 - 270.7 ng/mL Final     Iron Saturation (%)   Date Value Ref Range Status   12/27/2019 36 15 - 50 % Final     TSH   Date Value Ref Range Status   11/02/2019 2.750 0.600 - 3.300 uIU/mL Final     Free T4   Date Value Ref Range Status   11/02/2019 1.42 (H) 0.71 - 1.40 ng/dL Final     Vitamin Z-61   Date Value Ref Range Status   11/27/2018 554 232 - 1,245 pg/mL Final     CO2   Date Value Ref Range Status   03/22/2021 29.0 20.0 - 31.0 mmol/L Final           Author:  Patrcia Dolly 06/21/2021 10:37 AM    Note - This record has been created using [employer]. Chart creation errors have been sought, but may not always have been located. Such creation errors do not reflect on the standard of medical care.

## 2021-07-19 ENCOUNTER — Other Ambulatory Visit: Payer: Self-pay | Admitting: Family Medicine

## 2021-07-19 DIAGNOSIS — Z1231 Encounter for screening mammogram for malignant neoplasm of breast: Secondary | ICD-10-CM

## 2021-07-23 NOTE — Unmapped (Signed)
The [facility] has made a third and final attempt to reach this patient to refill the following medication:Jakafi.      We have left voicemails on the following phone numbers: [phone] and have been unable to leave messages on the following phone numbers: [phone].    Dates contacted: 9/16,19,26  Last scheduled delivery: 06/21/21    The patient may be at risk of non-compliance with this medication. The patient should call the [facility] at [phone]  Option 4, then Option 1 (oncology) to refill medication.    [name] Hulda Humphrey   [facility] Specialty Technician

## 2021-08-03 DIAGNOSIS — D45 Polycythemia vera: Principal | ICD-10-CM

## 2021-08-04 NOTE — Unmapped (Signed)
Courtney Erickson needs to be seen in clinic at her earliest convenience.  I have put in labs.

## 2021-08-07 NOTE — Unmapped (Signed)
Sent Courtney Erickson a reminder about scheduling  A follow up for 10/31

## 2021-08-22 ENCOUNTER — Inpatient Hospital Stay
Admission: RE | Admit: 2021-08-22 | Discharge: 2021-08-22 | Disposition: A | Discharge status: Home / Self Care | Payer: Self-pay | Source: Ambulatory Visit | Attending: *Deleted | Admitting: *Deleted

## 2021-08-22 ENCOUNTER — Other Ambulatory Visit: Payer: Self-pay | Admitting: *Deleted

## 2021-08-22 DIAGNOSIS — Z1231 Encounter for screening mammogram for malignant neoplasm of breast: Secondary | ICD-10-CM

## 2021-08-27 ENCOUNTER — Other Ambulatory Visit: Admit: 2021-08-27 | Discharge: 2021-08-28 | Payer: MEDICARE

## 2021-08-27 ENCOUNTER — Ambulatory Visit: Admit: 2021-08-27 | Discharge: 2021-08-28 | Payer: MEDICARE | Attending: Adult Health | Primary: Adult Health

## 2021-08-27 DIAGNOSIS — D45 Polycythemia vera: Principal | ICD-10-CM

## 2021-08-27 LAB — CBC W/ AUTO DIFF
BASOPHILS ABSOLUTE COUNT: 0.1 10*9/L (ref 0.0–0.1)
BASOPHILS RELATIVE PERCENT: 0.8 %
EOSINOPHILS ABSOLUTE COUNT: 0.4 10*9/L (ref 0.0–0.5)
EOSINOPHILS RELATIVE PERCENT: 3.5 %
HEMATOCRIT: 41.6 % (ref 34.0–44.0)
HEMOGLOBIN: 14.1 g/dL (ref 11.3–14.9)
LYMPHOCYTES ABSOLUTE COUNT: 2.3 10*9/L (ref 1.1–3.6)
LYMPHOCYTES RELATIVE PERCENT: 21.2 %
MEAN CORPUSCULAR HEMOGLOBIN CONC: 33.8 g/dL (ref 32.0–36.0)
MEAN CORPUSCULAR HEMOGLOBIN: 28.3 pg (ref 25.9–32.4)
MEAN CORPUSCULAR VOLUME: 83.7 fL (ref 77.6–95.7)
MEAN PLATELET VOLUME: 8.8 fL (ref 6.8–10.7)
MONOCYTES ABSOLUTE COUNT: 0.7 10*9/L (ref 0.3–0.8)
MONOCYTES RELATIVE PERCENT: 6.2 %
NEUTROPHILS ABSOLUTE COUNT: 7.5 10*9/L (ref 1.8–7.8)
NEUTROPHILS RELATIVE PERCENT: 68.3 %
PLATELET COUNT: 472 10*9/L — ABNORMAL HIGH (ref 150–450)
RED BLOOD CELL COUNT: 4.97 10*12/L (ref 3.95–5.13)
RED CELL DISTRIBUTION WIDTH: 16.2 % — ABNORMAL HIGH (ref 12.2–15.2)
WBC ADJUSTED: 11 10*9/L (ref 3.6–11.2)

## 2021-08-27 LAB — LACTATE DEHYDROGENASE: LACTATE DEHYDROGENASE: 233 U/L (ref 120–246)

## 2021-08-27 LAB — COMPREHENSIVE METABOLIC PANEL
ALBUMIN: 4.3 g/dL (ref 3.4–5.0)
ALKALINE PHOSPHATASE: 68 U/L (ref 46–116)
ALT (SGPT): 29 U/L (ref 10–49)
ANION GAP: 6 mmol/L (ref 5–14)
AST (SGOT): 58 U/L — ABNORMAL HIGH (ref <=34)
BILIRUBIN TOTAL: 0.8 mg/dL (ref 0.3–1.2)
BLOOD UREA NITROGEN: 9 mg/dL (ref 9–23)
BUN / CREAT RATIO: 14
CALCIUM: 9.5 mg/dL (ref 8.7–10.4)
CHLORIDE: 104 mmol/L (ref 98–107)
CO2: 28 mmol/L (ref 20.0–31.0)
CREATININE: 0.65 mg/dL
EGFR CKD-EPI (2021) FEMALE: >90 mL/min/{1.73_m2} (ref >=60)
GLUCOSE RANDOM: 102 mg/dL (ref 70–179)
POTASSIUM: 4.5 mmol/L (ref 3.4–4.8)
PROTEIN TOTAL: 7.2 g/dL (ref 5.7–8.2)
SODIUM: 138 mmol/L (ref 135–145)

## 2021-08-27 MED ORDER — RUXOLITINIB 15 MG TABLET
ORAL_TABLET | Freq: Two times a day (BID) | ORAL | 6 refills | 30 days | Status: CP
Start: 2021-08-27 — End: 2022-03-25
  Filled 2021-09-27: qty 60, 30d supply, fill #0

## 2021-08-27 NOTE — Unmapped (Signed)
If you have not heard from the pharmacy by the time you have 3 days of medication left, please call!     [facility] at [phone]  Option 4, then Option 1 (oncology) to refill medication.    Please return in 3 months for a follow up visit and labs.    If you have any questions, please do not hesitate to contact us.    **Fevers over 100.4 are an EMERGENCY. Please go to the nearest ER**    If you experience new or worsening of the following symptoms, please call:  1. Night sweats  2. Abdominal discomfort in the left upper quadrant  3. Feeling full early  4. Excessive fatigue  5. Unintentional weight loss  6. Bone pain  7. Itching after hot showers    When reviewing your results, please remember that the results of many of the tests we order can vary somewhat and that variation often means nothing.  Sometimes when we get results back after your clinic visit, if it looks like there???s some variation of that type, we may decide to recheck things sooner than we discussed in clinic.  If you get a call that we want to recheck things sooner, do not panic. It does not mean that things are going wrong.    For appointments & questions Monday through Friday 8 AM-- 5 PM   please call [phone] or Toll free [phone].    On Nights, Weekends and Holidays  Call [phone] and ask for the adult hematologist/oncologist on call.    Markus Jarvis, AGPCNP-C, MSN  Nurse Practitioner  Hematologic Malignancies  [facility]    Nurse Navigator: Pete Glatter, RN  Questions and appointments M-F 8am - 5pm: [phone] or [phone]    [facility]  [address]  [place], [place] [address]  [email or web address]    Results for orders placed or performed in visit on 08/27/21   CBC w/ Differential   Result Value Ref Range    WBC 11.0 3.6 - 11.2 10*9/L    RBC 4.97 3.95 - 5.13 10*12/L    HGB 14.1 11.3 - 14.9 g/dL    HCT 25.9 56.3 - 87.5 %    MCV 83.7 77.6 - 95.7 fL    MCH 28.3 25.9 - 32.4 pg    MCHC 33.8 32.0 - 36.0 g/dL RDW 64.3 (H) 32.9 - 51.8 %    MPV 8.8 6.8 - 10.7 fL    Platelet 472 (H) 150 - 450 10*9/L    Neutrophils % 68.3 %    Lymphocytes % 21.2 %    Monocytes % 6.2 %    Eosinophils % 3.5 %    Basophils % 0.8 %    Absolute Neutrophils 7.5 1.8 - 7.8 10*9/L    Absolute Lymphocytes 2.3 1.1 - 3.6 10*9/L    Absolute Monocytes 0.7 0.3 - 0.8 10*9/L    Absolute Eosinophils 0.4 0.0 - 0.5 10*9/L    Absolute Basophils 0.1 0.0 - 0.1 10*9/L    Anisocytosis Slight (A) Not Present

## 2021-08-27 NOTE — Unmapped (Incomplete)
ID: Courtney Erickson is a 55 y.o. 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 This treatment controlled her counts, but did not relieve many of the associated MPN symptoms.  For this reason, she was started on ruxolitinib.  At 15 mg BID, her counts are well controlled.       She's doing well overall with her ruxolitinib.  She reports a few missed doses, but compliance is overall quite good.  She remains on several psych medications and ruxolitinib continues to be the preferred treatment over interferon.    Her MPN 10 score has improved.  It has fluctuated, but if her sleep improves, this may get better.  Her fatigue is largely related to lack of sleep than her MPN.  She is now caring for a friend that was recently diagnosed with cancer.  She is unable to shut her brain off while trying to sleep. I think utilization of trazodone would be beneficial, but given significant psych med burden, would recommend PCP or psych provider prescribe this.    She also stopped her 2pm adderall and found this not to be helpful with her sleep situation.  Given this, advised her to restart PM adderall for her ADHD.    Gave her the number to call [facility] when she is nearly out of medication as they have been having a hard time contacting her.    PLAN:  1) Continue Ruxolitinib at 15 mg BID  2) Consider starting trazodone  3) Continue ASA 81mg  daily   4) Restart 2:00 PM  Adderall.   5) Continue CT scan for lung cancer screening (11/2021    Markus Jarvis, RN, MSN, AGPCNP-C  Nurse Practitioner  Hematologic Malignancies  [facility]  08/27/2021      I personally spent 55 minutes face-to-face and non-face-to-face in the care of this patient, which includes all pre, intra, and post visit time on the date of service.    RISK ASSESSMENT:  ?? 15.8/14/596  ?? No h/o thrombosis  ?? 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 10 yr overall survival: 94%; AML: 48.5%  ?? 10 yr overall survival: 94%; AML: 3%; MF: 5%    ?? 20 yr overall survival: 66%; AML: 20%; MF: 18%    PCV Thrombosis Risk  Age > 60:    0  History of thrombosis:  0  Low Risk: 0 points (Every day aspirin unless presence of other high risk factors)    TX HX:  ?? 2013: Hydrea  ?? 2022: Switched to ruxolitinib 10 mg BID due to MPN sx     HEME HX  06/2012: Dx with PCV  ?? Possible splenomegaly  ?? Started on phlebotomy  ?? 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%    09/2015: Transitioned to [facility]; held when platelets normalized  09/2019: CBC: 15.8/14.0/596; Restarted Hydrea 500 mg every day   10/2019: 12.7/13.2/591; Increased Hydrea to 1000 mg every day   05/04/20: 5.5/12.8/218; CD34: 0.04  08/07/20:   Variants of Known/Likely Clinical Significance:   Gene Coding Predicted Protein Variant allele fraction   JAK2 c.1849G>T p.(Val617Phe) 21.9 %   PTPN11 c.178G>A p.(Gly60Ser) 48.5 %     11/09/20: 5.6/13.2/279;   11/24/20: Began Ruxolitinib 10 mg BID; DC'd hydrea  12/14/20: Inc ruxolitinib 15 mg BID  03/22/21: 6.5/12.2/344    INTERVAL HX:  Courtney Erickson came for routine FU of her PCV.  We discussed the following:   ?? Medication Adherence: She has been getting her Jakafi.  Tolerating well.  Has not missed any doses.    ?? Polypharmacy:  We reviewed her meds.  I have attempted to edit the list to the meds she is actually taking.   ?? Sleep: Still with poor sleep.  Still can go days without sleep.  She tries to make herself sleep but is unable to clear her head.     ?? Psych:  She reports being bipolar.  She is on a number of psychiatric meds including 2 stimulants. She notes recent crying spells. She denies SI/SP  ?? ROS: She denied F/C/UR; her COPD is at baseline; no bleeding/clotting sx; Still with some interstitial cystitis symptoms.    MPN 10 Score  (App: [email or web address]) [email or web address]  (Useful for monitoring patient symptoms.    Symptom Range 7/21 1/22 2/17  5/26 10/31   Fatigue in the past 24 hours (Absent) 0 1 2 3 4 5 6 7 8 9  10 (Worst Imaginable) 10 10 10+ 10 10   Filling up quickly when you eat (Early satiety)  (Absent) 0 1 2 3 4 5 6 7 8 9  10 (Worst Imaginable) 0 0 0 0 0   Abdominal discomfort  (Absent) 0 1 2 3 4 5 6 7 8 9  10 (Worst Imaginable) 0 0 0 0 0   Inactivity (Absent) 0 1 2 3 4 5 6 7 8 9  10 (Worst Imaginable) 8 8 8 10 5    Problems with concentration - (Absent) 0 1 2 3 4 5 6 7 8 9  10 (Worst Imaginable) 5 8 5 10  0   Numbness/ Tingling (in my hands and feet) (Absent) 0 1 2 3 4 5 6 7 8 9  10 (Worst Imaginable) 3 3 3 3 7    Night sweats (Absent) 0 1 2 3 4 5 6 7 8 9  10 (Worst Imaginable) 8 8 0 3 8   Itching (pruritus) (Absent) 0 1 2 3 4 5 6 7 8 9  10 (Worst Imaginable) 4  Night itch  aquagenic pruritus;  4 0  Sleep is better  3 0   Bone pain (diffuse not joint pain or arthritis) (Absent) 0 1 2 3 4 5 6 7 8 9  10 (Worst Imaginable) 3 Severe but infreq  2   0 0 0   Fever (>100 F) (Absent) 0 1 2 3 4 5 6 7 8 9  10 (Daily) 0 0 0 0 0   Unintentional weight loss last 6 months (Absent) 0 1 2 3 4 5 6 7 8 9  10 (Worst Imaginable) 0 0 0 0 0   Total  41 43 26 42 30     PHYSICAL EXAM:  VS: BP 124/66  - Pulse 76  - Temp 36.4 ??C (97.6 ??F) (Temporal)  - Resp 18  - Wt 68.3 kg (150 lb 8 oz)  - SpO2 98%  - BMI 32.57 kg/m??   GENERAL: No acute distress  HEENT: Plates;   LYMPH NODES: No significant LAN  NECK: NO JVD  LUNGS: Wheezing noted throughout; no consolidation; extended exp phase; no acc muscle use  ZOX:WRUE S1, S2  ABD: NTND; No HSM  EXT:No edema    PMHx:   ?? 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 H/O bleeding with childbirth, dental extractions, menstrual bleeding  ?? VWF measured as low as 20% and as high as 84%  ??  Responds to DDAVP  ?? Txed with Humate P75 U/kg BID for 5 days  ?? Lung cancer screening   ?? 12/19/17: Pulmonary nodules: Stable, Lung-RADS Category 2.  ?? 01/18/19: LD CT Chest: Few unchanged subcentimeter pulmonary nodules. No new nodule.   Lung-RADS: 2  ?? Severe cervical spondylosis (9/20)  ?? Colonoscopy (2016): single polyp  ?? Bladder disease: atony, chronic interstitial cystitis, with implanted bladder stimulator  ?? S/P hysterectomy for endometriosis  ?? Psychiatric disease, stated history of major depression; h/o Abilify, Wellbutrin, BuSpar, Adderall, Valium; on Latuda, gabapentin, Topamax, Viibryd, Abilify (txed by [facility]  ?? GERD, gastric ulcer, epigastric pain, with negative EGD and EUS for pancreatic disease (8/20)  ?? Mammogram (01/2017): BI-RADS 2, 1 year follow-up recommended/scheduled  ?? Nl Cardiac Cath (9/17)   ?? COPD;????smoker (30 pk yr), on Advair and DuoNeb;   ?? PFTs (12/15)  ?? FVC was 2.27 liters, 79% of predicted  FEV1 was 1.86, 78% of predicted  FEV1 ratio was 82  FEF 25-75% liters per second was 88% of predicted    LUNG VOLUMES:  TLC was 71% of predicted  RV was 55% of predicted    DIFFUSION CAPACITY:  DLCO was 63% of predicted  DLCO/VA was 123% of predicted    FLOW VOLUME LOOP:  ? Expiratory flow volume is somewhat flattened   ??

## 2021-08-29 MED FILL — JAKAFI 15 MG TABLET: ORAL | 30 days supply | Qty: 60 | Fill #3

## 2021-08-29 NOTE — Unmapped (Signed)
[facility] Clinical Assessment & Refill Coordination Note    Courtney Erickson, DOB: 1966-06-24  Phone: [phone] (home)     All above HIPAA information was verified with patient.     Was a Nurse, learning disability used for this call? No    Specialty Medication(s):   Hematology/Oncology: [id number]     Current Outpatient Medications   Medication Sig Dispense Refill   ??? ADVAIR DISKUS 250-50 mcg/dose diskus Inhale 1 puff 2 (two) times a day.      ??? albuterol HFA 90 mcg/actuation inhaler      ??? aspirin (ECOTRIN) 81 MG tablet Take 1 tablet (81 mg total) by mouth daily. 150 tablet 2   ??? atorvastatin (LIPITOR) 20 MG tablet Take 1 tablet (20 mg total) by mouth daily. 30 tablet 11   ??? cyclobenzaprine (FLEXERIL) 10 MG tablet      ??? dextroamphetamine-amphetamine (ADDERALL) 20 mg tablet TAKE 1 TABLET ONCE A DAY AT 2-3 P.M.     ??? diazePAM (VALIUM) 5 MG tablet TAKE 1 TABLET BY MOUTH AT BEDTIME FOR SLEEP     ??? esomeprazole (NEXIUM) 40 MG capsule Take 40 mg by mouth daily.     ??? fluticasone propionate (FLONASE) 50 mcg/actuation nasal spray SPRAY 1 SPRAY INTO EACH NOSTRIL EVERY DAY 16 mL 0   ??? folic acid (FOLVITE) 1 MG tablet Take 1,000 mcg by mouth daily.     ??? ipratropium-albuterol (DUO-NEB) 0.5-2.5 mg/3 mL nebulizer Inhale 3 mL by nebulization every six (6) hours as needed. 360 mL 11   ??? isosorbide mononitrate (IMDUR) 30 MG 24 hr tablet Take 30 mg by mouth daily.     ??? phenazopyridine (PYRIDIUM) 100 MG tablet Take 100 mg by mouth.     ??? propranoloL (INDERAL) 40 MG tablet Take 1 tablet by mouth daily.     ??? ruxolitinib (JAKAFI) 15 mg tablet Take 1 tablet (15 mg total) by mouth Two (2) times a day. 60 tablet 6   ??? SPIRIVA WITH HANDIHALER 18 mcg inhalation capsule INHALE 1 CAP INHALATION ONCE DAILY     ??? TRINTELLIX 20 mg tablet TAKE 1 TABLET BY MOUTH EVERY DAY IN THE MORNING     ??? venlafaxine (EFFEXOR-XR) 150 MG 24 hr capsule TAKE 2 CAPSULES BY MOUTH EVERY MORNING     ??? VYVANSE 70 mg capsule Take 1 capsule by mouth daily.        No current facility-administered medications for this visit.        Changes to medications: Eather reports no changes at this time.    Allergies   Allergen Reactions   ??? Lyrica [Pregabalin] Other (See Comments)     Unsteady gate, causing falls   ??? Gabapentin Nausea And Vomiting   ??? Tramadol Rash       Changes to allergies: No    SPECIALTY MEDICATION ADHERENCE     [name]: 2 days of medicine on hand     Medication Adherence    Patient reported X missed doses in the last month: 0  Specialty Medication: [name] twice daily  Patient is on additional specialty medications: No  Informant: patient  Confirmed plan for next specialty medication refill: delivery by pharmacy  Refills needed for supportive medications: not needed          Specialty medication(s) dose(s) confirmed: Regimen is correct and unchanged.     Are there any concerns with adherence? Patient denies missed doses.  [facility] last dispensed  30 day supply of Jakafi on 06/21/21    Adherence counseling provided? Not needed    CLINICAL MANAGEMENT AND INTERVENTION      Clinical Benefit Assessment:    Do you feel the medicine is effective or helping your condition? Patient declined to answer    Clinical Benefit counseling provided? Not needed    Adverse Effects Assessment:    Are you experiencing any side effects? No    Are you experiencing difficulty administering your medicine? No    Quality of Life Assessment:    Quality of Life    Rheumatology  Oncology  1. What impact has your specialty medication had on the reduction of your daily pain or discomfort level?: Tremendous  2. On a scale of 1-10, how would you rate your ability to manage side effects associated with your specialty medication? 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 For example, brushing your teeth or getting up in the morning. 0    Have you discussed this with your provider? Yes    Acute Infection Status:    Acute infections noted within Epic:  No active infections  Patient reported infection: None    Therapy Appropriateness:    Is therapy appropriate and patient progressing towards therapeutic goals? Yes, therapy is appropriate and should be continued    DISEASE/MEDICATION-SPECIFIC INFORMATION      N/A    PATIENT SPECIFIC NEEDS     - Does the patient have any physical, cognitive, or cultural barriers? No    - Is the patient high risk? No    - Does the patient require a Care Management Plan? No     - Does the patient require physician intervention or other additional services (i.e. nutrition, smoking cessation, social work)? No      SHIPPING     Specialty Medication(s) to be Shipped:   Hematology/Oncology: Jakafi    Other medication(s) to be shipped: No additional medications requested for fill at this time     Changes to insurance: No    Delivery Scheduled: Yes, Expected medication delivery date: 08/30/21.     Medication will be delivered via Next Day Courier to the confirmed prescription address in [facility].    The patient will receive a drug information handout for each medication shipped and additional FDA Medication Guides as required.  Verified that patient has previously received a Conservation officer, historic buildings and a Surveyor, mining.    The patient or caregiver noted above participated in the development of this care plan and knows that they can request review of or adjustments to the care plan at any time.      All of the patient's questions and concerns have been addressed.    [name] Shared [facility] Specialty Pharmacist

## 2021-09-17 NOTE — Unmapped (Signed)
[facility] Refill Coordination Note    Specialty Medication(s) to be Shipped:   Hematology/Oncology: [name]    Other medication(s) to be shipped: No additional medications requested for fill at this time     RICCI PAFF, DOB: Apr 12, 1966  Phone: [phone] (home)       All above HIPAA information was verified with patient.     Was a Nurse, learning disability used for this call? No    Completed refill call assessment today to schedule patient's medication shipment from the [facility] [phone]).  All relevant notes have been reviewed.     Specialty medication(s) and dose(s) confirmed: Regimen is correct and unchanged.   Changes to medications: Charlena reports no changes at this time.  Changes to insurance: No  New side effects reported not previously addressed with a pharmacist or physician: None reported  Questions for the pharmacist: No    Confirmed patient received a Conservation officer, historic buildings and a Surveyor, mining with first shipment. The patient will receive a drug information handout for each medication shipped and additional FDA Medication Guides as required.       DISEASE/MEDICATION-SPECIFIC INFORMATION        N/A    SPECIALTY MEDICATION ADHERENCE     Medication Adherence    Patient reported X missed doses in the last month: 0  Specialty Medication: [name]  Patient is on additional specialty medications: No  Informant: patient              Were doses missed due to medication being on hold? No    Jakafi  15 mg: 14 days of medicine on hand       REFERRAL TO PHARMACIST     Referral to the pharmacist: Not needed      [facility]     Shipping address confirmed in Epic.     Delivery Scheduled: Yes, Expected medication delivery date: 09/28/21.     Medication will be delivered via Next Day Courier to the prescription address in Epic [place].    [name]   [facility] Specialty Technician

## 2021-10-23 NOTE — Unmapped (Signed)
[facility] Refill Coordination Note    Specialty Medication(s) to be Shipped:   Hematology/Oncology: [name]    Other medication(s) to be shipped: No additional medications requested for fill at this time     AIKAM HELLICKSON, DOB: [birth year]  Phone: [phone] (home)       All above HIPAA information was verified with patient.     Was a Nurse, learning disability used for this call? No    Completed refill call assessment today to schedule patient's medication shipment from the [facility] [phone]).  All relevant notes have been reviewed.     Specialty medication(s) and dose(s) confirmed: Regimen is correct and unchanged.   Changes to medications: Prabhnoor reports no changes at this time.  Changes to insurance: No  New side effects reported not previously addressed with a pharmacist or physician: None reported  Questions for the pharmacist: No    Confirmed patient received a Conservation officer, historic buildings and a Surveyor, mining with first shipment. The patient will receive a drug information handout for each medication shipped and additional FDA Medication Guides as required.       DISEASE/MEDICATION-SPECIFIC INFORMATION        N/A    SPECIALTY MEDICATION ADHERENCE     Medication Adherence    Patient reported X missed doses in the last month: 0  Specialty Medication: Jakafi 15mg   Patient is on additional specialty medications: No        Were doses missed due to medication being on hold? No    [name]: 7 days of medicine on hand     REFERRAL TO PHARMACIST     Referral to the pharmacist: Not needed      [facility]     Shipping address confirmed in Epic.     Delivery Scheduled: Yes, Expected medication delivery date: 10/26/2021.     Medication will be delivered via Next Day Courier to the prescription address in Epic [facility].    Oretha Milch   [facility] Specialty Technician

## 2021-10-23 NOTE — Unmapped (Signed)
The [facility] has made a second and final attempt to reach this patient to refill the following medication:Jakafi.      We have been unable to leave messages on the following phone numbers: [phone] and have sent a MyChart message.    Dates contacted: 12/20,27  Last scheduled delivery: 09/27/21    The patient may be at risk of non-compliance with this medication. The patient should call the [facility] at [phone]  Option 4, then Option 1 (oncology) to refill medication.    [name] Hulda Humphrey   [facility] Specialty Technician

## 2021-10-25 MED FILL — JAKAFI 15 MG TABLET: ORAL | 30 days supply | Qty: 60 | Fill #4

## 2021-11-19 NOTE — Unmapped (Signed)
The [facility] has made a second and final attempt to reach this patient to refill the following medication:Jakafi.      We have left voicemails on the following phone numbers: [phone].    Dates contacted: 1/18,23  Last scheduled delivery: 10/25/21    The patient may be at risk of non-compliance with this medication. The patient should call the [facility] at [phone]  Option 4, then Option 1 (oncology) to refill medication.    Courtney Erickson Courtney Erickson   [facility] Specialty Technician

## 2021-11-23 ENCOUNTER — Other Ambulatory Visit: Payer: Self-pay

## 2021-11-23 ENCOUNTER — Ambulatory Visit
Admission: RE | Admit: 2021-11-23 | Discharge: 2021-11-23 | Disposition: A | Discharge status: Home / Self Care | Payer: Medicare Other | Source: Ambulatory Visit | Attending: Family Medicine | Admitting: Family Medicine

## 2021-11-23 DIAGNOSIS — Z1231 Encounter for screening mammogram for malignant neoplasm of breast: Secondary | ICD-10-CM | POA: Diagnosis not present

## 2021-11-26 ENCOUNTER — Ambulatory Visit: Admit: 2021-11-26 | Discharge: 2021-11-26 | Payer: MEDICARE | Attending: Adult Health | Primary: Adult Health

## 2021-11-26 ENCOUNTER — Other Ambulatory Visit: Admit: 2021-11-26 | Discharge: 2021-11-26 | Payer: MEDICARE

## 2021-11-26 DIAGNOSIS — R5383 Other fatigue: Principal | ICD-10-CM

## 2021-11-26 DIAGNOSIS — D45 Polycythemia vera: Principal | ICD-10-CM

## 2021-11-26 DIAGNOSIS — Z87891 Personal history of nicotine dependence: Principal | ICD-10-CM

## 2021-11-26 DIAGNOSIS — R1031 Right lower quadrant pain: Principal | ICD-10-CM

## 2021-11-26 LAB — CBC W/ AUTO DIFF
BASOPHILS ABSOLUTE COUNT: 0.1 10*9/L (ref 0.0–0.1)
BASOPHILS RELATIVE PERCENT: 0.7 %
EOSINOPHILS ABSOLUTE COUNT: 0.3 10*9/L (ref 0.0–0.5)
EOSINOPHILS RELATIVE PERCENT: 3.8 %
HEMATOCRIT: 33.6 % — ABNORMAL LOW (ref 34.0–44.0)
HEMOGLOBIN: 11.3 g/dL (ref 11.3–14.9)
LYMPHOCYTES ABSOLUTE COUNT: 2 10*9/L (ref 1.1–3.6)
LYMPHOCYTES RELATIVE PERCENT: 22.8 %
MEAN CORPUSCULAR HEMOGLOBIN CONC: 33.6 g/dL (ref 32.0–36.0)
MEAN CORPUSCULAR HEMOGLOBIN: 28.2 pg (ref 25.9–32.4)
MEAN CORPUSCULAR VOLUME: 84.2 fL (ref 77.6–95.7)
MEAN PLATELET VOLUME: 8.8 fL (ref 6.8–10.7)
MONOCYTES ABSOLUTE COUNT: 0.6 10*9/L (ref 0.3–0.8)
MONOCYTES RELATIVE PERCENT: 7.1 %
NEUTROPHILS ABSOLUTE COUNT: 5.6 10*9/L (ref 1.8–7.8)
NEUTROPHILS RELATIVE PERCENT: 65.6 %
PLATELET COUNT: 395 10*9/L (ref 150–450)
RED BLOOD CELL COUNT: 3.99 10*12/L (ref 3.95–5.13)
RED CELL DISTRIBUTION WIDTH: 17.5 % — ABNORMAL HIGH (ref 12.2–15.2)
WBC ADJUSTED: 8.6 10*9/L (ref 3.6–11.2)

## 2021-11-26 LAB — COMPREHENSIVE METABOLIC PANEL
ALBUMIN: 3.8 g/dL (ref 3.4–5.0)
ALKALINE PHOSPHATASE: 74 U/L (ref 46–116)
ALT (SGPT): 41 U/L (ref 10–49)
ANION GAP: 7 mmol/L (ref 5–14)
AST (SGOT): 69 U/L — ABNORMAL HIGH (ref <=34)
BILIRUBIN TOTAL: 0.4 mg/dL (ref 0.3–1.2)
BLOOD UREA NITROGEN: 17 mg/dL (ref 9–23)
BUN / CREAT RATIO: 23
CALCIUM: 9 mg/dL (ref 8.7–10.4)
CHLORIDE: 105 mmol/L (ref 98–107)
CO2: 27 mmol/L (ref 20.0–31.0)
CREATININE: 0.74 mg/dL
EGFR CKD-EPI (2021) FEMALE: >90 mL/min/{1.73_m2} (ref >=60)
GLUCOSE RANDOM: 103 mg/dL (ref 70–179)
POTASSIUM: 4.3 mmol/L (ref 3.4–4.8)
PROTEIN TOTAL: 6.7 g/dL (ref 5.7–8.2)
SODIUM: 139 mmol/L (ref 135–145)

## 2021-11-26 LAB — TSH: THYROID STIMULATING HORMONE: 3.667 u[IU]/mL (ref 0.550–4.780)

## 2021-11-26 LAB — LACTATE DEHYDROGENASE: LACTATE DEHYDROGENASE: 237 U/L (ref 120–246)

## 2021-11-26 MED ORDER — FOLIC ACID 1 MG TABLET
Freq: Every day | ORAL | 0 refills | 0 days | Status: CN
Start: 2021-11-26 — End: ?

## 2021-11-26 MED ORDER — FLUTICASONE PROPIONATE 50 MCG/ACTUATION NASAL SPRAY,SUSPENSION
0 refills | 0 days | Status: CN
Start: 2021-11-26 — End: ?

## 2021-11-26 NOTE — Unmapped (Signed)
Patient stated that she need refills on Lipitor, Flonase, and folic acid.

## 2021-11-26 NOTE — Unmapped (Unsigned)
Phlebotomy drawn by Normajean Baxter, RN. 23g LAC.  Labs drawn and sent for analysis.

## 2021-11-26 NOTE — Unmapped (Signed)
ID: Courtney Erickson is a 56 y.o. 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 This treatment controlled her counts, but did not relieve many of the associated MPN symptoms.  For this reason, she was started on ruxolitinib.  At 15 mg BID, her counts are well controlled.       No big changes since being seen last.  She has not received the call from [facility] to schedule medication delivery so I have given her the number to call.  Labs continue to look good.  She still struggles with fatigue, but she is still caring for her friend with cancer and also has chronic insomnia.    She is having some RLQ abdominal pain.  This has been getting worse for several months now.  Unable to palpate the area due to tenderness.  She does not have an appendix.  She had a hysterectomy and states that she had her ovaries and tubes removed.  However, on review on prior CT imaging, it says that there are no adenexal masses, meaning she likely only had her uterus removed.  I am concerned at this point for ovarian pathology.  Bowels are moving regularly.  Plan for CT A/P.    PLAN:  1) Continue Ruxolitinib at 15 mg BID  2) Continue ASA 81mg  daily   3) CT A/P ASAP  4) Low dose CT for lung cancer screening same day  5) RTC 2 months      Markus Jarvis, RN, MSN, AGPCNP-C  Nurse Practitioner  Hematologic Malignancies  [facility]  11/26/2021      I personally spent 55 minutes face-to-face and non-face-to-face in the care of this patient, which includes all pre, intra, and post visit time on the date of service.    RISK ASSESSMENT:  ?? 15.8/14/596  ?? No h/o thrombosis  ?? 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 10 yr overall survival: 94%; AML: 3%; MF: 5%    ?? 20 yr overall survival: 66%; AML: 20%; MF: 18%    PCV Thrombosis Risk  Age > 60:    0  History of thrombosis:  0  Low Risk: 0 points (Every day aspirin unless presence of other high risk factors)    TX HX:  ?? 2013: Hydrea  ?? 2022: Switched to ruxolitinib 10 mg BID due to MPN sx     HEME HX  06/2012: Dx with PCV  ?? Possible splenomegaly  ?? Started on phlebotomy  ?? 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; held when platelets normalized  09/2019: CBC: 15.8/14.0/596; Restarted Hydrea 500 mg every day   10/2019: 12.7/13.2/591; Increased Hydrea to 1000 mg every day   05/04/20: 5.5/12.8/218; CD34: 0.04  08/07/20:   Variants of Known/Likely Clinical Significance:   Gene Coding Predicted Protein Variant allele fraction   JAK2 c.1849G>T p.(Val617Phe) 21.9 %   PTPN11 c.178G>A p.(Gly60Ser) 48.5 %     11/09/20: 5.6/13.2/279;   11/24/20: Began Ruxolitinib 10 mg BID; DC'd hydrea  12/14/20: Inc ruxolitinib 15 mg BID  03/22/21: 6.5/12.2/344  11/26/21: 8.6/11.3/395 ANC 5.6    INTERVAL HX:  Courtney Erickson came for routine FU of her PCV.  We discussed the following:   ?? Medication Adherence: She has been getting her Jakafi.  Tolerating well.  Has not missed  any doses.    ?? Polypharmacy:  We reviewed her meds.  I have attempted to edit the list to the meds she is actually taking.   ?? Sleep: Continues to have intermittent sleep.  Has tried trazodone in the past.  ?? Psych:  She reports being bipolar.  She is on a number of psychiatric meds including 2 stimulants. She notes recent crying spells. She denies SI/SP  ?? ROS: She denied F/C/UR; her COPD is at baseline; no bleeding/clotting sx; Reports some pain in her RLQ, occasionally bilateral.  Tingling feeling in her feet at the same time; no GI symptoms along with it.  This has started to get worse    MPN 10 Score  (App: [email or web address])   [email or web address]  (Useful for monitoring patient symptoms.    Symptom Range 7/21 1/22 2/17  5/26 10/31   Fatigue in the past 24 hours (Absent) 0 1 2 3 4 5 6 7 8 9  10 (Worst Imaginable) 10 10 10+ 10 10   Filling up quickly when you eat (Early satiety)  (Absent) 0 1 2 3 4 5 6 7 8 9  10 (Worst Imaginable) 0 0 0 0 0   Abdominal discomfort  (Absent) 0 1 2 3 4 5 6 7 8 9  10 (Worst Imaginable) 0 0 0 0 0   Inactivity (Absent) 0 1 2 3 4 5 6 7 8 9  10 (Worst Imaginable) 8 8 8 10 5    Problems with concentration - (Absent) 0 1 2 3 4 5 6 7 8 9  10 (Worst Imaginable) 5 8 5 10  0   Numbness/ Tingling (in my hands and feet) (Absent) 0 1 2 3 4 5 6 7 8 9  10 (Worst Imaginable) 3 3 3 3 7    Night sweats (Absent) 0 1 2 3 4 5 6 7 8 9  10 (Worst Imaginable) 8 8 0 3 8   Itching (pruritus) (Absent) 0 1 2 3 4 5 6 7 8 9  10 (Worst Imaginable) 4  Night itch  aquagenic pruritus;  4 0  Sleep is better  3 0   Bone pain (diffuse not joint pain or arthritis) (Absent) 0 1 2 3 4 5 6 7 8 9  10 (Worst Imaginable) 3 Severe but infreq  2   0 0 0   Fever (>100 F) (Absent) 0 1 2 3 4 5 6 7 8 9  10 (Daily) 0 0 0 0 0   Unintentional weight loss last 6 months (Absent) 0 1 2 3 4 5 6 7 8 9  10 (Worst Imaginable) 0 0 0 0 0   Total  41 43 26 42 30     PHYSICAL EXAM:  VS: BP 110/80  - Pulse 76  - Temp 37.1 ??C (98.8 ??F) (Temporal)  - Resp 18  - Ht 144.8 cm (4' 9)  - Wt 69.7 kg (153 lb 11.2 oz)  - SpO2 100%  - BMI 33.26 kg/m??   GENERAL: No acute distress  HEENT: Plates;   LYMPH NODES: No significant LAN  NECK: NO JVD  LUNGS: Wheezing noted throughout; no consolidation; extended exp phase; no acc muscle use  ZOX:WRUE S1, S2  ABD: NTND; No HSM  EXT:No edema    PMHx:   ?? 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 H/O bleeding with childbirth, dental extractions, menstrual bleeding  ?? VWF measured as low as 20% and as high as 84%  ?? Responds to DDAVP  ?? Txed with Humate P75 U/kg BID for 5  days  ?? Lung cancer screening   ?? 12/19/17: Pulmonary nodules: Stable, Lung-RADS Category 2.  ?? 01/18/19: LD CT Chest: Few unchanged subcentimeter pulmonary nodules. No new nodule.   Lung-RADS: 2  ?? Severe cervical spondylosis (9/20)  ?? Colonoscopy (2016): single polyp  ?? Bladder disease: atony, chronic interstitial cystitis, with implanted bladder stimulator  ?? S/P hysterectomy for endometriosis  ?? Psychiatric disease, stated history of major depression; h/o Abilify, Wellbutrin, BuSpar, Adderall, Valium; on Latuda, gabapentin, Topamax, Viibryd, Abilify (txed by [facility]  ?? GERD, gastric ulcer, epigastric pain, with negative EGD and EUS for pancreatic disease (8/20)  ?? Mammogram (01/2017): BI-RADS 2, 1 year follow-up recommended/scheduled  ?? Nl Cardiac Cath (9/17)   ?? COPD;????smoker (30 pk yr), on Advair and DuoNeb;   ?? PFTs (12/15)  ?? FVC was 2.27 liters, 79% of predicted  FEV1 was 1.86, 78% of predicted  FEV1 ratio was 82  FEF 25-75% liters per second was 88% of predicted    LUNG VOLUMES:  TLC was 71% of predicted  RV was 55% of predicted    DIFFUSION CAPACITY:  DLCO was 63% of predicted  DLCO/VA was 123% of predicted    FLOW VOLUME LOOP:  ? Expiratory flow volume is somewhat flattened   ??

## 2021-11-26 NOTE — Unmapped (Signed)
The patient should call the [facility] at [phone]  Option 4, then Option 1 (oncology) to refill medication.

## 2021-12-31 NOTE — Unmapped (Signed)
[facility] Clinical Assessment & Refill Coordination Note    Courtney Erickson, DOB: 11-12-1965  Phone: [phone] (home)     All above HIPAA information was verified with patient.     Was a Nurse, learning disability used for this call? No    Specialty Medication(s):   Hematology/Oncology: [id number]     Current Outpatient Medications   Medication Sig Dispense Refill   ??? ADVAIR DISKUS 250-50 mcg/dose diskus Inhale 1 puff 2 (two) times a day.      ??? albuterol HFA 90 mcg/actuation inhaler      ??? aspirin (ECOTRIN) 81 MG tablet Take 1 tablet (81 mg total) by mouth daily. 150 tablet 2   ??? atorvastatin (LIPITOR) 20 MG tablet Take 1 tablet (20 mg total) by mouth daily. (Patient not taking: Reported on 11/26/2021) 30 tablet 11   ??? dextroamphetamine-amphetamine (ADDERALL) 20 mg tablet TAKE 1 TABLET ONCE A DAY AT 2-3 P.M.     ??? diazePAM (VALIUM) 5 MG tablet TAKE 1 TABLET BY MOUTH AT BEDTIME FOR SLEEP     ??? esomeprazole (NEXIUM) 40 MG capsule Take 40 mg by mouth daily.     ??? fluticasone propionate (FLONASE) 50 mcg/actuation nasal spray SPRAY 1 SPRAY INTO EACH NOSTRIL EVERY DAY (Patient not taking: Reported on 11/26/2021) 16 mL 0   ??? folic acid (FOLVITE) 1 MG tablet Take 1,000 mcg by mouth daily.     ??? ipratropium-albuterol (DUO-NEB) 0.5-2.5 mg/3 mL nebulizer Inhale 3 mL by nebulization every six (6) hours as needed. 360 mL 11   ??? isosorbide mononitrate (IMDUR) 30 MG 24 hr tablet Take 30 mg by mouth daily. (Patient not taking: Reported on 11/26/2021)     ??? phenazopyridine (PYRIDIUM) 100 MG tablet Take 100 mg by mouth. (Patient not taking: Reported on 11/26/2021)     ??? propranoloL (INDERAL) 40 MG tablet Take 1 tablet by mouth daily.     ??? ruxolitinib (JAKAFI) 15 mg tablet Take 1 tablet (15 mg total) by mouth Two (2) times a day. 60 tablet 6   ??? SPIRIVA WITH HANDIHALER 18 mcg inhalation capsule INHALE 1 CAP INHALATION ONCE DAILY     ??? TRINTELLIX 20 mg tablet TAKE 1 TABLET BY MOUTH EVERY DAY IN THE MORNING     ??? venlafaxine (EFFEXOR-XR) 150 MG 24 hr capsule TAKE 2 CAPSULES BY MOUTH EVERY MORNING     ??? VYVANSE 70 mg capsule Take 1 capsule by mouth daily.        No current facility-administered medications for this visit.        Changes to medications: Metztli reports no changes at this time.    Allergies   Allergen Reactions   ??? Lyrica [Pregabalin] Other (See Comments)     Unsteady gate, causing falls   ??? Trazodone      weakness   ??? Gabapentin Nausea And Vomiting   ??? Tramadol Rash       Changes to allergies: No    SPECIALTY MEDICATION ADHERENCE     [name]: 1 days of medicine on hand     Medication Adherence    Patient reported X missed doses in the last month: 0  Specialty Medication: Jakafi 15mg  (last filled 10/25/21 but patient said she has not missed doses because she had an extra bottle          Specialty medication(s) dose(s) confirmed: Regimen is correct and unchanged.     Are there any concerns with adherence? Yes: Jakafi 15mg  (  last filled 10/25/21 but patient said she has not missed doses because she had an extra bottle    Adherence counseling provided? Yes: stressed the importance of taking every day with no missed doses    CLINICAL MANAGEMENT AND INTERVENTION      Clinical Benefit Assessment:    Do you feel the medicine is effective or helping your condition? Yes    Clinical Benefit counseling provided? Not needed    Adverse Effects Assessment:    Are you experiencing any side effects? No    Are you experiencing difficulty administering your medicine? No    Quality of Life Assessment:    Quality of Life      Oncology  1. What impact has your specialty medication had on the reduction of your daily pain or discomfort level?: None  2. On a scale of 1-10, how would you rate your ability to manage side effects associated with your specialty medication? (1=no issues, 10 = unable to take medication due to side effects): 1            How many days over the past month did your condition/medication  keep you from your normal activities? For example, brushing your teeth or getting up in the morning. 0    Have you discussed this with your provider? Not needed    Acute Infection Status:    Acute infections noted within Epic:  No active infections  Patient reported infection: None    Therapy Appropriateness:    Is therapy appropriate and patient progressing towards therapeutic goals? Yes, therapy is appropriate and should be continued    DISEASE/MEDICATION-SPECIFIC INFORMATION      N/A    PATIENT SPECIFIC NEEDS     - Does the patient have any physical, cognitive, or cultural barriers? No    - Is the patient high risk? Yes, patient is taking oral chemotherapy. Appropriateness of therapy as been assessed    - Does the patient require a Care Management Plan? No           SHIPPING     Specialty Medication(s) to be Shipped:   Hematology/Oncology: Jakafi    Other medication(s) to be shipped: No additional medications requested for fill at this time     Changes to insurance: No    Delivery Scheduled: Yes, Expected medication delivery date: 01/01/22.     Medication will be delivered via Same Day Courier to the confirmed prescription address in [facility].    The patient will receive a drug information handout for each medication shipped and additional FDA Medication Guides as required.  Verified that patient has previously received a Conservation officer, historic buildings and a Surveyor, mining.    The patient or caregiver noted above participated in the development of this care plan and knows that they can request review of or adjustments to the care plan at any time.      All of the patient's questions and concerns have been addressed.    Rollen Sox   [facility] Specialty Pharmacist

## 2022-01-01 MED FILL — JAKAFI 15 MG TABLET: ORAL | 30 days supply | Qty: 60 | Fill #1

## 2022-01-07 ENCOUNTER — Ambulatory Visit: Admit: 2022-01-07 | Discharge: 2022-01-08 | Payer: MEDICARE

## 2022-01-07 MED ADMIN — iohexoL (OMNIPAQUE) 350 mg iodine/mL solution 100 mL: 100 mL | INTRAVENOUS | @ 16:00:00 | Stop: 2022-01-07

## 2022-01-24 ENCOUNTER — Ambulatory Visit
Admit: 2022-01-24 | Discharge: 2022-01-24 | Payer: MEDICARE | Attending: Hematology & Oncology | Primary: Hematology & Oncology

## 2022-01-24 ENCOUNTER — Ambulatory Visit: Admit: 2022-01-24 | Discharge: 2022-01-24 | Payer: MEDICARE

## 2022-01-24 ENCOUNTER — Other Ambulatory Visit: Admit: 2022-01-24 | Discharge: 2022-01-24 | Payer: MEDICARE

## 2022-01-24 DIAGNOSIS — M25519 Pain in unspecified shoulder: Principal | ICD-10-CM

## 2022-01-24 DIAGNOSIS — E611 Iron deficiency: Principal | ICD-10-CM

## 2022-01-24 DIAGNOSIS — M75101 Unspecified rotator cuff tear or rupture of right shoulder, not specified as traumatic: Principal | ICD-10-CM

## 2022-01-24 DIAGNOSIS — R0602 Shortness of breath: Principal | ICD-10-CM

## 2022-01-24 DIAGNOSIS — D45 Polycythemia vera: Principal | ICD-10-CM

## 2022-01-24 LAB — COMPREHENSIVE METABOLIC PANEL
ALBUMIN: 3.9 g/dL (ref 3.4–5.0)
ALKALINE PHOSPHATASE: 72 U/L (ref 46–116)
ALT (SGPT): 56 U/L — ABNORMAL HIGH (ref 10–49)
ANION GAP: 6 mmol/L (ref 5–14)
AST (SGOT): 84 U/L — ABNORMAL HIGH (ref <=34)
BILIRUBIN TOTAL: 0.4 mg/dL (ref 0.3–1.2)
BLOOD UREA NITROGEN: 15 mg/dL (ref 9–23)
BUN / CREAT RATIO: 22
CALCIUM: 9.2 mg/dL (ref 8.7–10.4)
CHLORIDE: 108 mmol/L — ABNORMAL HIGH (ref 98–107)
CO2: 25 mmol/L (ref 20.0–31.0)
CREATININE: 0.68 mg/dL
EGFR CKD-EPI (2021) FEMALE: >90 mL/min/{1.73_m2} (ref >=60)
GLUCOSE RANDOM: 115 mg/dL (ref 70–179)
POTASSIUM: 5.2 mmol/L — ABNORMAL HIGH (ref 3.4–4.8)
PROTEIN TOTAL: 7.6 g/dL (ref 5.7–8.2)
SODIUM: 139 mmol/L (ref 135–145)

## 2022-01-24 LAB — CBC W/ AUTO DIFF
BASOPHILS ABSOLUTE COUNT: 0.1 10*9/L (ref 0.0–0.1)
BASOPHILS RELATIVE PERCENT: 1 %
EOSINOPHILS ABSOLUTE COUNT: 0.7 10*9/L — ABNORMAL HIGH (ref 0.0–0.5)
EOSINOPHILS RELATIVE PERCENT: 5.6 %
HEMATOCRIT: 29.2 % — ABNORMAL LOW (ref 34.0–44.0)
HEMOGLOBIN: 9.6 g/dL — ABNORMAL LOW (ref 11.3–14.9)
LYMPHOCYTES ABSOLUTE COUNT: 2 10*9/L (ref 1.1–3.6)
LYMPHOCYTES RELATIVE PERCENT: 15.2 %
MEAN CORPUSCULAR HEMOGLOBIN CONC: 32.9 g/dL (ref 32.0–36.0)
MEAN CORPUSCULAR HEMOGLOBIN: 26.9 pg (ref 25.9–32.4)
MEAN CORPUSCULAR VOLUME: 81.9 fL (ref 77.6–95.7)
MEAN PLATELET VOLUME: 8.7 fL (ref 6.8–10.7)
MONOCYTES ABSOLUTE COUNT: 0.8 10*9/L (ref 0.3–0.8)
MONOCYTES RELATIVE PERCENT: 6.2 %
NEUTROPHILS ABSOLUTE COUNT: 9.5 10*9/L — ABNORMAL HIGH (ref 1.8–7.8)
NEUTROPHILS RELATIVE PERCENT: 72 %
PLATELET COUNT: 591 10*9/L — ABNORMAL HIGH (ref 150–450)
RED BLOOD CELL COUNT: 3.56 10*12/L — ABNORMAL LOW (ref 3.95–5.13)
RED CELL DISTRIBUTION WIDTH: 18.4 % — ABNORMAL HIGH (ref 12.2–15.2)
WBC ADJUSTED: 13.2 10*9/L — ABNORMAL HIGH (ref 3.6–11.2)

## 2022-01-24 LAB — FERRITIN: FERRITIN: 5.3 ng/mL — ABNORMAL LOW

## 2022-01-24 LAB — LACTATE DEHYDROGENASE: LACTATE DEHYDROGENASE: 237 U/L (ref 120–246)

## 2022-01-24 MED ORDER — FERROUS SULFATE 325 MG (65 MG IRON) TABLET
ORAL_TABLET | ORAL | 3 refills | 52 days | Status: CP
Start: 2022-01-24 — End: 2022-08-22

## 2022-01-24 NOTE — Unmapped (Signed)
ID: Courtney Erickson is a 56 y.o. with a h/o PCV     DZ CHAR: PCV  ?? BM: NA  ?? Molecular WU   ?? JAK2 (Val617Phe): 21.9%  ?? 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 Clotting Risk (include variables): Low Risk  ?? No h/o thrombosis  ?? JAK2+  ?? Age < 60  ?? CV Risk Factors: Yes  ?? Tx: Aspirin  TX CHAR:  ??? Hydrea: Changed for lack of symptom control  ??? Ruxolitinib: 15 mg BID    RISK ASSESSMENT:  ?? 10 yr overall survival: 94%; AML: 3%; MF: 5%    ?? 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 This treatment controlled her counts, but did not relieve many of the associated MPN symptoms.  For this reason, she was started on ruxolitinib.  At 15 mg BID, her counts have been well controlled.  There has been some improvement in her symptoms.  I suspect the remainder of her symptoms are not related to her PCV.      There are a number of other issues:   ?? Abd pain:  This was an issue at her last visit.  She has had a CT scan, which is unremarkable.  Her pain may be related to her iron deficiency.   ?? Iron Deficiency: Her ferritin is < 10 in the setting of thrombocytosis and anemia.  She has iron deficiency anemia, which may explain some of her non-specific symptoms.  She needs a colonoscopy to rule out colon cancer. If we do not find a source of the bleeding, we may need to hold her aspirin.   ?? Shoulder Pain:  She has had a supraspinatus tear.  I have referred her to PM&R  ?? Transaminitis:  She has a mild transaminitis with AST > ALT.  She has had intermittent elevations in the past.  I will follow this for now.     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 Possible splenomegaly  ?? Started on phlebotomy  ?? 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%    09/2015: Transitioned to [facility]; held when platelets normalized  09/2019: CBC: 15.8/14.0/596; Restarted Hydrea 500 mg every day   10/2019: 12.7/13.2/591; Increased Hydrea to 1000 mg every day   05/04/20: 5.5/12.8/218; CD34: 0.04  08/07/20:   Variants of Known/Likely Clinical Significance:   Gene Coding Predicted Protein Variant allele fraction   JAK2 c.1849G>T p.(Val617Phe) 21.9 %   PTPN11 c.178G>A p.(Gly60Ser) 48.5 %     11/09/20: 5.6/13.2/279;   11/24/20: Began Ruxolitinib 10 mg BID; DC'd hydrea  12/14/20: Inc ruxolitinib 15 mg BID  03/22/21: 6.5/12.2/344  11/26/21: 8.6/11.3/395 ANC 5.6  01/24/22: 13.2/9.6/591; ferritin: 5.3    INTERVAL HX:  Courtney Erickson comes for FU of her  PCV.  We discussed the following:  ??? Arm/shoulder pain: She notes a bump under her right arm about 2 weeks ago. It has been getting bigger over that time.    o She has had no fevers  o She notes  no redness.    o She has no radiating pain  o She recently had a nl mammogram  ??? Shortness of breath: This has been getting worse over the  past  3 to 4 days.    o She denies CP; she has had no cough  o She uses her nebulizer every 4 hours  o She has had no edema; she does sleep in a chair  o She gets short of breath at 20 yrd  ??? Lower abd pain: She describes this pain as predominantly inguinal. It gets worse with activity. She is slow to get up.   ??? Bleeding: She is unaware of any melena or signs of GI bleeding.   ??? COVID: She took 2 COVID tests this week - both were negative  ??? She is having difficulty eating; she has not had weight loss.     PHYSICAL EXAM:  VS: As recorded in Epic  GENERAL: She did not desat after three min of walking;   HEENT:OP was clear  LYMPH NODES: No LAN  LUNGS:Dim; some extended W; no consolidation; no cough  COR: Dist S1, S2; no sign m/r/g  ABD: Diffuse tenderness without rebound; NABS; no HSM  EXT: No edema; pain noted in hips bilaterally; tenderness under right arm and axilla; no redness; pain over jt space; some swelling noted     MPN 10 Score  (App: [email or web address])   [email or web address]  (Useful for monitoring patient symptoms.    Symptom Range 7/21 1/22 2/17  5/26 10/31   Fatigue in the past 24 hours (Absent) 0 1 2 3 4 5 6 7 8 9  10 (Worst Imaginable) 10 10 10+ 10 10   Filling up quickly when you eat (Early satiety)  (Absent) 0 1 2 3 4 5 6 7 8 9  10 (Worst Imaginable) 0 0 0 0 0   Abdominal discomfort  (Absent) 0 1 2 3 4 5 6 7 8 9  10 (Worst Imaginable) 0 0 0 0 0   Inactivity (Absent) 0 1 2 3 4 5 6 7 8 9  10 (Worst Imaginable) 8 8 8 10 5    Problems with concentration - (Absent) 0 1 2 3 4 5 6 7 8 9  10 (Worst Imaginable) 5 8 5 10  0   Numbness/ Tingling (in my hands and feet) (Absent) 0 1 2 3 4 5 6 7 8 9  10 (Worst Imaginable) 3 3 3 3 7    Night sweats (Absent) 0 1 2 3 4 5 6 7 8 9  10 (Worst Imaginable) 8 8 0 3 8   Itching (pruritus) (Absent) 0 1 2 3 4 5 6 7 8 9  10 (Worst Imaginable) 4  Night itch  aquagenic pruritus;  4 0  Sleep is better  3 0   Bone pain (diffuse not joint pain or arthritis) (Absent) 0 1 2 3 4 5 6 7 8 9  10 (Worst Imaginable) 3 Severe but infreq  2   0 0 0   Fever (>100 F) (Absent) 0 1 2 3 4 5 6 7 8 9  10 (Daily) 0 0 0 0 0   Unintentional weight loss last 6 months (Absent) 0 1 2 3 4 5 6 7 8 9  10 (Worst Imaginable) 0 0 0 0 0   Total  41 43 26 42 30     PMHx:   ?? 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 H/O bleeding with childbirth, dental extractions, menstrual bleeding  ?? VWF measured as low as 20%  and as high as 84%  ?? Responds to DDAVP  ?? Txed with Humate P75 U/kg BID for 5 days  ?? Lung cancer screening   ?? 12/19/17: Pulmonary nodules: Stable, Lung-RADS Category 2.  ?? 01/18/19: LD CT Chest: Few unchanged subcentimeter pulmonary nodules. No new nodule.   Lung-RADS: 2  ?? Severe cervical spondylosis (9/20)  ?? Colonoscopy (2016): single polyp  ?? Bladder disease: atony, chronic interstitial cystitis, with implanted bladder stimulator  ?? S/P hysterectomy for endometriosis  ?? Psychiatric disease, stated history of major depression; h/o Abilify, Wellbutrin, BuSpar, Adderall, Valium; on Latuda, gabapentin, Topamax, Viibryd, Abilify (txed by [facility]  ?? GERD, gastric ulcer, epigastric pain, with negative EGD and EUS for pancreatic disease (8/20)  ?? Mammogram (01/2017): BI-RADS 2, 1 year follow-up recommended/scheduled  ?? Nl Cardiac Cath (9/17)   ?? COPD;????smoker (30 pk yr), on Advair and DuoNeb;   ?? PFTs (12/15)  ?? FVC was 2.27 liters, 79% of predicted  FEV1 was 1.86, 78% of predicted  FEV1 ratio was 82  FEF 25-75% liters per second was 88% of predicted    LUNG VOLUMES:  TLC was 71% of predicted  RV was 55% of predicted    DIFFUSION CAPACITY:  DLCO was 63% of predicted  DLCO/VA was 123% of predicted    FLOW VOLUME LOOP:  ? Expiratory flow volume is somewhat flattened   ??

## 2022-01-24 NOTE — Unmapped (Addendum)
PLAN:   I am concerned about the shoulder and arm pain.     Let's get an ultrasound for the arm and shoulder on the right.     I have sent a BNP. If this test is elevated, it suggests a degree of heart failure.   I have sent a ferritin, which will tell me if there is iron deficiency.     Summary:   You have been having shoulder/arm pain for a few weeks and increasing shortness of breath for a few days.  Your blood looks like you have an infection.     You had CT scan a few weeks ago.  This looked pretty good.        All lab results last 24 hours:    Recent Results (from the past 24 hour(s))   Comprehensive Metabolic Panel    Collection Time: 01/24/22 11:45 AM   Result Value Ref Range    Sodium 139 135 - 145 mmol/L    Potassium 5.2 (H) 3.4 - 4.8 mmol/L    Chloride 108 (H) 98 - 107 mmol/L    CO2 25.0 20.0 - 31.0 mmol/L    Anion Gap 6 5 - 14 mmol/L    BUN 15 9 - 23 mg/dL    Creatinine 1.61 0.96 - 0.80 mg/dL    BUN/Creatinine Ratio 22     eGFR CKD-EPI (2021) Female >90 >=60 mL/min/1.20m2    Glucose 115 70 - 179 mg/dL    Calcium 9.2 8.7 - 04.5 mg/dL    Albumin 3.9 3.4 - 5.0 g/dL    Total Protein 7.6 5.7 - 8.2 g/dL    Total Bilirubin 0.4 0.3 - 1.2 mg/dL    AST 84 (H) <=40 U/L These are liver numbers.  They are not particular high.  You can see this in fatty liver disease. You can also see it with medications     ALT 56 (H) 10 - 49 U/L    Alkaline Phosphatase 72 46 - 116 U/L   Lactate dehydrogenase    Collection Time: 01/24/22 11:45 AM   Result Value Ref Range    LDH 237 120 - 246 U/L   CBC w/ Differential    Collection Time: 01/24/22 11:45 AM   Result Value Ref Range    WBC 13.2 (H) 3.6 - 11.2 10*9/L    RBC 3.56 (L) 3.95 - 5.13 10*12/L    HGB 9.6 (L) 11.3 - 14.9 g/dL This is down a lot .      HCT 29.2 (L) 34.0 - 44.0 %    MCV 81.9 77.6 - 95.7 fL    MCH 26.9 25.9 - 32.4 pg    MCHC 32.9 32.0 - 36.0 g/dL    RDW 98.1 (H) 19.1 - 15.2 %    MPV 8.7 6.8 - 10.7 fL    Platelet 591 (H) 150 - 450 10*9/L These are up    Neutrophils % 72.0 %    Lymphocytes % 15.2 %    Monocytes % 6.2 %    Eosinophils % 5.6 %    Basophils % 1.0 %    Absolute Neutrophils 9.5 (H) 1.8 - 7.8 10*9/L These are up    Absolute Lymphocytes 2.0 1.1 - 3.6 10*9/L    Absolute Monocytes 0.8 0.3 - 0.8 10*9/L    Absolute Eosinophils 0.7 (H) 0.0 - 0.5 10*9/L    Absolute Basophils 0.1 0.0 - 0.1 10*9/L    Anisocytosis Slight (A) Not Present    Hypochromasia Slight (  A) Not Present

## 2022-01-24 NOTE — Unmapped (Signed)
Patient arrived for lab draw. Labs obtained via venipuncture in R AC by Maralyn Sago, RN. Labs collected and sent for analysis.

## 2022-01-24 NOTE — Unmapped (Signed)
Patient stated that she noticed last week that she have a soft knot under her right arm pit. In the past week it have gotten bigger. It's a little tender.

## 2022-02-01 NOTE — Unmapped (Signed)
[facility] has made a second and final attempt to reach this patient to refill the following medication:Jakafi.      We have left voicemails on the following phone numbers: [phone] and have sent a MyChart message.    Dates contacted: 3/28,4/7  Last scheduled delivery: 01/01/22    The patient may be at risk of non-compliance with this medication. The patient should call the [facility] at [phone]  Option 4, then Option 1 (oncology) to refill medication.    [name] Margretta Ditty [facility] Specialty Technician  06/02/22

## 2022-02-08 NOTE — Unmapped (Signed)
[facility] has made a third attempt to reach this patient to refill the following medication:Jakafi.      We have left voicemails on the following phone numbers: [phone] and have sent a MyChart message.    Dates contacted: 01/22/22; 02/01/22; 02/08/22  Last scheduled delivery: 01/01/22    The patient may be at risk of non-compliance with this medication. The patient should call the [facility] at [phone]  Option 4, then Option 1 (oncology) to refill medication.    [name] Shared [facility] Specialty Pharmacist

## 2022-02-18 MED FILL — JAKAFI 15 MG TABLET: ORAL | 30 days supply | Qty: 60 | Fill #5

## 2022-02-18 NOTE — Unmapped (Signed)
[facility] Clinical Assessment & Refill Coordination Note    Courtney Erickson, DOB: 05/16/1966  Phone: [phone] (home)     All above HIPAA information was verified with patient.     Was a Nurse, learning disability used for this call? No    Specialty Medication(s):   Hematology/Oncology: [id number]     Current Outpatient Medications   Medication Sig Dispense Refill    ADVAIR DISKUS 250-50 mcg/dose diskus Inhale 1 puff  in the morning and 1 puff in the evening.      albuterol HFA 90 mcg/actuation inhaler       aspirin (ECOTRIN) 81 MG tablet Take 1 tablet (81 mg total) by mouth daily. 150 tablet 2    atorvastatin (LIPITOR) 20 MG tablet Take 1 tablet (20 mg total) by mouth daily. (Patient not taking: Reported on 11/26/2021) 30 tablet 11    esomeprazole (NEXIUM) 40 MG capsule Take 1 capsule (40 mg total) by mouth daily.      ferrous sulfate 325 (65 FE) MG tablet Take 1 tablet (325 mg total) by mouth Every Tuesday, Thursday, Saturday, Sunday. 30 tablet 3    fluticasone propionate (FLONASE) 50 mcg/actuation nasal spray SPRAY 1 SPRAY INTO EACH NOSTRIL EVERY DAY (Patient not taking: Reported on 11/26/2021) 16 mL 0    folic acid (FOLVITE) 1 MG tablet Take 1 tablet (1,000 mcg total) by mouth daily.      ipratropium-albuterol (DUO-NEB) 0.5-2.5 mg/3 mL nebulizer Inhale 3 mL by nebulization every six (6) hours as needed. 360 mL 11    isosorbide mononitrate (IMDUR) 30 MG 24 hr tablet Take 30 mg by mouth daily. (Patient not taking: Reported on 11/26/2021)      propranoloL (INDERAL) 40 MG tablet Take 1 tablet (40 mg total) by mouth in the morning.      ruxolitinib (JAKAFI) 15 mg tablet Take 1 tablet (15 mg total) by mouth Two (2) times a day. 60 tablet 5    ruxolitinib (JAKAFI) 15 mg tablet Take 1 tablet (15 mg total) by mouth Two (2) times a day. 60 tablet 6    SPIRIVA WITH HANDIHALER 18 mcg inhalation capsule INHALE 1 CAP INHALATION ONCE DAILY      TRINTELLIX 20 mg tablet TAKE 1 TABLET BY MOUTH EVERY DAY IN THE MORNING      venlafaxine (EFFEXOR-XR) 150 MG 24 hr capsule TAKE 2 CAPSULES BY MOUTH EVERY MORNING       No current facility-administered medications for this visit.        Changes to medications: Dezaree reports no changes at this time.    Allergies   Allergen Reactions    Lyrica [Pregabalin] Other (See Comments)     Unsteady gate, causing falls    Trazodone      weakness    Gabapentin Nausea And Vomiting    Tramadol Rash       Changes to allergies: No    SPECIALTY MEDICATION ADHERENCE     [name]: 1-2 days of medicine on hand (will be out of medication after 02/18/22)    Medication Adherence    Patient reported X missed doses in the last month: 1-2  Specialty Medication: [name] - 1 tab (15 mg) twice daily  Patient is on additional specialty medications: No  Informant: patient  Confirmed plan for next specialty medication refill: delivery by pharmacy  Refills needed for supportive medications: not needed          Specialty medication(s) dose(s) confirmed: Regimen  is correct and unchanged.     Are there any concerns with adherence? No    Adherence counseling provided? Not needed    CLINICAL MANAGEMENT AND INTERVENTION      Clinical Benefit Assessment:    Do you feel the medicine is effective or helping your condition? Yes    Clinical Benefit counseling provided? Not needed    Adverse Effects Assessment:    Are you experiencing any side effects? No    Are you experiencing difficulty administering your medicine? No    Quality of Life Assessment:    Quality of Life    Rheumatology  Oncology  2. On a scale of 1-10, how would you rate your ability to manage side effects associated with your specialty medication? 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 For example, brushing your teeth or getting up in the morning. 0    Have you discussed this with your provider? Not needed    Acute Infection Status:    Acute infections noted within Epic:  No active infections  Patient reported infection: None    Therapy Appropriateness:    Is therapy appropriate and patient progressing towards therapeutic goals? Yes, therapy is appropriate and should be continued    DISEASE/MEDICATION-SPECIFIC INFORMATION      N/A    PATIENT SPECIFIC NEEDS     Does the patient have any physical, cognitive, or cultural barriers? No    Is the patient high risk? No    Does the patient require a Care Management Plan? No     SOCIAL DETERMINANTS OF HEALTH     At the [facility], we have learned that life circumstances - like trouble affording food, housing, utilities, or transportation can affect the health of many of our patients.   That is why we wanted to ask: are you currently experiencing any life circumstances that are negatively impacting your health and/or quality of life? Patient declined to answer (not discussed)    Social Determinants of Health     Financial Resource Strain: Not on file   Internet Connectivity: Not on file   Food Insecurity: Not on file   Tobacco Use: Medium Risk    Smoking Tobacco Use: Former    Smokeless Tobacco Use: Never    Passive Exposure: Not on file   Housing/Utilities: Not on file   Alcohol Use: Not on file   Transportation Needs: Not on file   Substance Use: Not on file   Health Literacy: Not on file   Physical Activity: Not on file   Interpersonal Safety: Not on file   Stress: Not on file   Intimate Partner Violence: Not on file   Depression: Not on file   Social Connections: Not on file       Would you be willing to receive help with any of the needs that you have identified today? Not applicable       SHIPPING     Specialty Medication(s) to be Shipped:   Hematology/Oncology: [facility]on    Other medication(s) to be shipped: No additional medications requested for fill at this time     Changes to insurance: No    Delivery Scheduled: Yes, Expected medication delivery date: 02/19/22.     Medication will be delivered via Next Day Courier  to the confirmed prescription address in [facility].    The patient will receive a drug information handout for each medication shipped and additional FDA Medication Guides as required.  Verified that patient has previously received a Conservation officer, historic buildings and a Surveyor, mining.    The patient or caregiver noted above participated in the development of this care plan and knows that they can request review of or adjustments to the care plan at any time.      All of the patient's questions and concerns have been addressed.    [name] [facility] Specialty Pharmacist

## 2022-02-26 NOTE — Unmapped (Signed)
Courtney Erickson,     Can you contact Ms Mangen? There were several issues at her last visit and I want to make sure they got done prior to her next visit at the end of May.      ?? She was to begin Ferrous Sulfate 325 mg three times per week for iron deficiency.  I hope she has   ?? Colonoscopy: I ordered a colonoscopy to work up her iron deficiency.    ?? Referral to PM&R: She tore her supraspinatus and I made this referral.      Thanks, H

## 2022-02-27 NOTE — Unmapped (Signed)
Called and left message with [facility] re: referring patient for colonoscopy

## 2022-02-27 NOTE — Unmapped (Signed)
Called and s/w patient.  Has been taking ferrous tablets without any issue.  Have been unable to schedule colonoscopy at [facility] d/t to scheduling out into July/August.  Patient asked if she could have it done at [facility].  Advised I will call them and see if I can get it done there.

## 2022-03-12 NOTE — Unmapped (Signed)
[facility] Refill Coordination Note    Specialty Medication(s) to be Shipped:   Hematology/Oncology: [name]    Other medication(s) to be shipped: No additional medications requested for fill at this time     Courtney Erickson, DOB: 1966-10-27  Phone: [phone] (home)       All above HIPAA information was verified with patient.     Was a Nurse, learning disability used for this call? No    Completed refill call assessment today to schedule patient's medication shipment from the [facility] [phone]).  All relevant notes have been reviewed.     Specialty medication(s) and dose(s) confirmed: Regimen is correct and unchanged.   Changes to medications: Kenadee reports starting the following medications: ferrous sulfate 325 three days a week  Changes to insurance: No  New side effects reported not previously addressed with a pharmacist or physician: None reported  Questions for the pharmacist: No    Confirmed patient received a Conservation officer, historic buildings and a Surveyor, mining with first shipment. The patient will receive a drug information handout for each medication shipped and additional FDA Medication Guides as required.       DISEASE/MEDICATION-SPECIFIC INFORMATION        N/A    SPECIALTY MEDICATION ADHERENCE     Medication Adherence    Patient reported X missed doses in the last month: 0  Specialty Medication: [name]  Patient is on additional specialty medications: No              Were doses missed due to medication being on hold? No    jakafi 15mg   : 3 days of medicine on hand       REFERRAL TO PHARMACIST     Referral to the pharmacist: Not needed      [facility]     Shipping address confirmed in Epic.     Delivery Scheduled: Yes, Expected medication delivery date: 5/18.     Medication will be delivered via Next Day Courier to the prescription address in Epic WAM.    Courtney Erickson   [facility] Specialty Technician

## 2022-03-13 MED FILL — JAKAFI 15 MG TABLET: ORAL | 30 days supply | Qty: 60 | Fill #2

## 2022-03-27 ENCOUNTER — Ambulatory Visit: Admit: 2022-03-27 | Payer: MEDICARE

## 2022-03-27 ENCOUNTER — Ambulatory Visit: Admit: 2022-03-27 | Payer: MEDICARE | Attending: Adult Health | Primary: Adult Health

## 2022-03-28 NOTE — Unmapped (Signed)
Called to reschedule missed appt. No answer, VM full

## 2022-04-08 ENCOUNTER — Ambulatory Visit: Admit: 2022-04-08 | Discharge: 2022-04-08 | Payer: MEDICARE | Attending: Adult Health | Primary: Adult Health

## 2022-04-08 ENCOUNTER — Other Ambulatory Visit: Admit: 2022-04-08 | Discharge: 2022-04-08 | Payer: MEDICARE

## 2022-04-08 DIAGNOSIS — D45 Polycythemia vera: Principal | ICD-10-CM

## 2022-04-08 DIAGNOSIS — E119 Type 2 diabetes mellitus without complications: Principal | ICD-10-CM

## 2022-04-08 DIAGNOSIS — E611 Iron deficiency: Principal | ICD-10-CM

## 2022-04-08 DIAGNOSIS — I2510 Atherosclerotic heart disease of native coronary artery without angina pectoris: Principal | ICD-10-CM

## 2022-04-08 LAB — COMPREHENSIVE METABOLIC PANEL
ALBUMIN: 3.9 g/dL (ref 3.4–5.0)
ALKALINE PHOSPHATASE: 74 U/L (ref 46–116)
ALT (SGPT): 84 U/L — ABNORMAL HIGH (ref 10–49)
ANION GAP: 5 mmol/L (ref 5–14)
AST (SGOT): 95 U/L — ABNORMAL HIGH (ref <=34)
BILIRUBIN TOTAL: 0.7 mg/dL (ref 0.3–1.2)
BLOOD UREA NITROGEN: 18 mg/dL (ref 9–23)
BUN / CREAT RATIO: 27
CALCIUM: 9.5 mg/dL (ref 8.7–10.4)
CHLORIDE: 107 mmol/L (ref 98–107)
CO2: 27 mmol/L (ref 20.0–31.0)
CREATININE: 0.66 mg/dL
EGFR CKD-EPI (2021) FEMALE: >90 mL/min/{1.73_m2} (ref >=60)
GLUCOSE RANDOM: 100 mg/dL (ref 70–179)
POTASSIUM: 4.5 mmol/L (ref 3.4–4.8)
PROTEIN TOTAL: 7.5 g/dL (ref 5.7–8.2)
SODIUM: 139 mmol/L (ref 135–145)

## 2022-04-08 LAB — CBC W/ AUTO DIFF
BASOPHILS ABSOLUTE COUNT: 0.2 10*9/L — ABNORMAL HIGH (ref 0.0–0.1)
BASOPHILS RELATIVE PERCENT: 1.5 %
EOSINOPHILS ABSOLUTE COUNT: 0.3 10*9/L (ref 0.0–0.5)
EOSINOPHILS RELATIVE PERCENT: 3.3 %
HEMATOCRIT: 38.8 % (ref 34.0–44.0)
HEMOGLOBIN: 12.8 g/dL (ref 11.3–14.9)
LYMPHOCYTES ABSOLUTE COUNT: 1.7 10*9/L (ref 1.1–3.6)
LYMPHOCYTES RELATIVE PERCENT: 16.9 %
MEAN CORPUSCULAR HEMOGLOBIN CONC: 32.9 g/dL (ref 32.0–36.0)
MEAN CORPUSCULAR HEMOGLOBIN: 26.2 pg (ref 25.9–32.4)
MEAN CORPUSCULAR VOLUME: 79.7 fL (ref 77.6–95.7)
MEAN PLATELET VOLUME: 9.1 fL (ref 6.8–10.7)
MONOCYTES ABSOLUTE COUNT: 0.8 10*9/L (ref 0.3–0.8)
MONOCYTES RELATIVE PERCENT: 7.5 %
NEUTROPHILS ABSOLUTE COUNT: 7.2 10*9/L (ref 1.8–7.8)
NEUTROPHILS RELATIVE PERCENT: 70.8 %
PLATELET COUNT: 442 10*9/L (ref 150–450)
RED BLOOD CELL COUNT: 4.88 10*12/L (ref 3.95–5.13)
RED CELL DISTRIBUTION WIDTH: 19.2 % — ABNORMAL HIGH (ref 12.2–15.2)
WBC ADJUSTED: 10.1 10*9/L (ref 3.6–11.2)

## 2022-04-08 LAB — HEMOGLOBIN A1C
ESTIMATED AVERAGE GLUCOSE: 100 mg/dL
HEMOGLOBIN A1C: 5.1 % (ref 4.8–5.6)

## 2022-04-08 LAB — RETICULOCYTES
RETICULOCYTE ABSOLUTE COUNT: 49.5 10*9/L (ref 23.0–100.0)
RETICULOCYTE COUNT PCT: 1.01 % (ref 0.50–2.17)

## 2022-04-08 LAB — FERRITIN: FERRITIN: 18.7 ng/mL

## 2022-04-08 LAB — LACTATE DEHYDROGENASE: LACTATE DEHYDROGENASE: 204 U/L (ref 120–246)

## 2022-04-08 LAB — CHOLESTEROL, TOTAL: CHOLESTEROL: 176 mg/dL (ref <=200)

## 2022-04-08 MED ORDER — TRINTELLIX 20 MG TABLET
ORAL_TABLET | Freq: Every morning | ORAL | 0 refills | 90 days
Start: 2022-04-08 — End: ?

## 2022-04-08 MED ORDER — VENLAFAXINE ER 150 MG CAPSULE,EXTENDED RELEASE 24 HR
ORAL_CAPSULE | Freq: Every day | ORAL | 1 refills | 30 days
Start: 2022-04-08 — End: ?

## 2022-04-08 NOTE — Unmapped (Signed)
ID: Courtney Erickson is a 56 y.o. 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 This treatment controlled her counts, but did not relieve many of the associated MPN symptoms.  For this reason, she was started on ruxolitinib.  At 15 mg BID, she continues to have good control of her counts and symptoms.  However, she has a number of problems that are unrelated to her MPN.       There are a number of other issues:   Abd pain: Imaging negative, still with pain, no worse no better   Iron Deficiency: Ferritin is improved - up to 18.7 from 5.3 at last visit.  She has not yet scheduled her colonoscopy (to be done at [facility]).   Shoulder Pain:  She has had a supraspinatus tear.  Sees PM&R next week  Transaminitis:  She has a mild transaminitis with AST > ALT.  She has had intermittent elevations in the past.  I will follow this for now.   Mental Health: for unclear reasons, she psychiatrist discharged her as a patient and is no longer filling medications.  She has not had any psych medications in several weeks.  The is tearful in clinic today, and has a lot of stress with her friend who has cancer, for whom she is the primary caregiver.  Unfortunately her list in Epic is not clear as some were patient reported.  I am  uncomfortable filling some of the without knowledge that she has been taking them.  We will work on getting records from [employer].  I have also referred her to [facility] for counseling and medication management.  Our navigator called pharmacy to confirm last fill and doses - they report that only the wellbutrin was filled recently.  I have sent in 150mg  daily for now.      [facility]    PLAN:  1) Continue Ruxolitinib at 15 mg BID  2) Continue ferrous Sulfate 325 mg three times per week   3) Continue ASA 81mg  daily for now  4) Colonoscopy - still needs to schedule  5) Seeing PM&R on Wednesday  6) RTC 2 months; continue to follow transaminases    Markus Jarvis, RN, MSN, AGPCNP-C  Nurse Practitioner  Hematologic Malignancies  [facility]  04/08/2022      I personally spent 60 minutes face-to-face and non-face-to-face in the care of this patient, which includes all pre, intra, and post visit time on the date of service.  All documented time was specific to the E/M visit and does not include any procedures that may have been performed.      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%    09/2015: Transitioned to [facility]; held when platelets normalized  09/2019: CBC: 15.8/14.0/596; Restarted Hydrea 500  mg every day   10/2019: 12.7/13.2/591; Increased Hydrea to 1000 mg every day   05/04/20: 5.5/12.8/218; CD34: 0.04  08/07/20:   Variants of Known/Likely Clinical Significance:   Gene Coding Predicted Protein Variant allele fraction   JAK2 c.1849G>T p.(Val617Phe) 21.9 %   PTPN11 c.178G>A p.(Gly60Ser) 48.5 %     11/09/20: 5.6/13.2/279;   11/24/20: Began Ruxolitinib 10 mg BID; DC'd hydrea  12/14/20: Inc ruxolitinib 15 mg BID  03/22/21: 6.5/12.2/344  11/26/21: 8.6/11.3/395 ANC 5.6  01/24/22: 13.2/9.6/591; ferritin: 5.3    INTERVAL HX:  Courtney Erickson comes for FU of her  PCV.  We discussed the following:  Arm/shoulder pain: taking tylenol 2g every 8 hours  Shortness of breath: This has been getting worse over the  past  3 to 4 days.    She denies CP; she has had no cough  She uses her nebulizer every 4 hours  She has had no edema; she does sleep in a chair  She gets short of breath at 20 yrd  Lower abd pain: This present, intermittent, cramping.  No improvement with iron supplementation  Bleeding: No bleeding that she notices.  Has not had her colonoscopy yet.  She is having difficulty eating; she has not had weight loss.   PMH:  Her mental health provider stopped seeing her in April.  Out of her depression meds - ran out back in April.    PHYSICAL EXAM:  VS: As recorded in Epic  GENERAL: She did not desat after three min of walking;   HEENT:OP was clear  LYMPH NODES: No LAN  LUNGS:Dim; some extended W; no consolidation; no cough  COR: Dist S1, S2; no sign m/r/g  ABD: Diffuse tenderness without rebound; NABS; no HSM  EXT: No edema; pain noted in hips bilaterally; tenderness under right arm and axilla; no redness; pain over jt space; some swelling noted     MPN 10 Score  (App: [email or web address])   [email or web address]  (Useful for monitoring patient symptoms.    Symptom Range 7/21 1/22 2/17  5/26 10/31 04/08/22   Fatigue in the past 24 hours (Absent) 0 1 2 3 4 5 6 7 8 9  10 (Worst Imaginable) 10 10 10+ 10 10 10    Filling up quickly when you eat (Early satiety)  (Absent) 0 1 2 3 4 5 6 7 8 9  10 (Worst Imaginable) 0 0 0 0 0 5   Abdominal discomfort  (Absent) 0 1 2 3 4 5 6 7 8 9  10 (Worst Imaginable) 0 0 0 0 0 0   Inactivity (Absent) 0 1 2 3 4 5 6 7 8 9  10 (Worst Imaginable) 8 8 8 10 5 5    Problems with concentration - (Absent) 0 1 2 3 4 5 6 7 8 9  10 (Worst Imaginable) 5 8 5 10  0 10   Numbness/ Tingling (in my hands and feet) (Absent) 0 1 2 3 4 5 6 7 8 9  10 (Worst Imaginable) 3 3 3 3 7  0   Night sweats (Absent) 0 1 2 3 4 5 6 7 8 9  10 (Worst Imaginable) 8 8 0 3 8 10    Itching (pruritus) (Absent) 0 1 2 3 4 5 6 7 8 9  10 (Worst Imaginable) 4  Night itch  aquagenic pruritus;  4 0  Sleep is better  3 0 0   Bone pain (diffuse not joint pain or arthritis) (Absent) 0 1 2 3 4 5 6 7 8 9  10 (Worst Imaginable) 3  Severe but infreq  2   0 0 0 5   Fever (>100 F) (Absent) 0 1 2 3 4 5 6 7 8 9  10 (Daily) 0 0 0 0 0 0   Unintentional weight loss last 6 months (Absent) 0 1 2 3 4 5 6 7 8 9  10 (Worst Imaginable) 0 0 0 0 0 0   Total  41 43 26 42 30 45     PMHx:    VWD: Responsive to DDAVP (2005)  H/O bleeding with childbirth, dental extractions, menstrual bleeding  VWF measured as low as 20% and as high as 84%  Responds to DDAVP  Txed with Humate P75 U/kg BID for 5 days  Lung cancer screening   12/19/17: Pulmonary nodules: Stable, Lung-RADS Category 2.  01/18/19: LD CT Chest: Few unchanged subcentimeter pulmonary nodules. No new nodule.   Lung-RADS: 2  Severe cervical spondylosis (9/20)  Colonoscopy (2016): single polyp  Bladder disease: atony, chronic interstitial cystitis, with implanted bladder stimulator  S/P hysterectomy for endometriosis  Psychiatric disease, stated history of major depression; h/o Abilify, Wellbutrin, BuSpar, Adderall, Valium; on Latuda, gabapentin, Topamax, Viibryd, Abilify (txed by [facility]  GERD, gastric ulcer, epigastric pain, with negative EGD and EUS for pancreatic disease (8/20)  Mammogram (01/2017): BI-RADS 2, 1 year follow-up recommended/scheduled  Nl Cardiac Cath (9/17)   COPD;  smoker (30 pk yr), on Advair and DuoNeb;   PFTs (12/15)  FVC was 2.27 liters, 79% of predicted  FEV1 was 1.86, 78% of predicted  FEV1 ratio was 82  FEF 25-75% liters per second was 88% of predicted    LUNG VOLUMES:  TLC was 71% of predicted  RV was 55% of predicted    DIFFUSION CAPACITY:  DLCO was 63% of predicted  DLCO/VA was 123% of predicted    FLOW VOLUME LOOP:  ? Expiratory flow volume is somewhat flattened

## 2022-04-08 NOTE — Unmapped (Unsigned)
[facility]  Physical Medicine and Rehabilitation     Patient Name:Courtney Erickson  MRN: [id number]  DOB: 08-27-1966  Age: 55 y.o.     ----------------------------------------------------------------------------------------------------------------------  April 10, 2022 10:00 AM. Documentation assistance provided by Winferd Humphrey, medical scribe, at the direction of Valeda Malm, MD.  ----------------------------------------------------------------------------------------------------------------------    ASSESSMENT & PLAN:     04/08/22     DIAGNOSIS: ***    TREATMENT PLAN: ***    NEXT STEPS/FOLLOW UP: ***      Comprehensive patient education performed today explaining that the care plan will integrate multimodal activity-based rehabilitation techniques to enhance recovery, reduce pain, and facilitate functionality. Risks, benefits, and instructions for all medications prescribed / treatments offered  reviewed extensively with patient, who expresses understanding.  Patient strongly advised regarding red flag signs such as new or progressive motor weakness, sensory deficits, saddle anesthesia, bowel/bladder dysfunction, gait/coordination disturbance, weight loss, and night pain that should prompt evaluation at nearest ER.    A consultation report has been transmitted to the consult requesting physician.    SUBJECTIVE:     Chief Complaint:    No chief complaint on file.    History of Present Illness:   Courtney Erickson is a 56 y.o. year old female being evaluated in consultation at the request of Halford Decamp, MD for No chief complaint on file.    I have reviewed the referring providers note.     Ongoing new shoulder pain with a h/o supraspinatus tear and cervical spondylosis. Pt reports noticing a bump under her right arm about 2 weeks before her visit with the referring provider on 01/24/22 and states that it has only gotten larger over time. She notes this bump is associated with the of her R shoulder and arm pain. Referring provider completed an ultrasound evaluation of her right shoulder but referred pt for a repeat ultrasound and evaluation of this bump and associated pain.     Currently pt reports ***       Symptom Location: RUE   Symptom Character: {GTCHARACTER:42837}  Symptom Onset/Mechanism: acute (< 1 month)  Temporal Pattern: {GTPATTERN:42839}  NRS Pain Intensity: {pain scale, das:38454}  Aggravating Factors: {GTAGGRAVATING:42842}  Alleviating Factors: {GTALLEVIATING:42843}  Night Pain: no  Unintended Weight Loss: no  Fever/Infection:no  Neuromotor Function: motor weakness - (no),  gait/coordination disturbance - (no), loss of bowel or bladder control - (no), saddle anesthesia - (no)     Prior Interventions/Modalities:  - Aspirin     Meds:  None     Home Exercise Program:  None     Prior Diagnostics:  [place] Shoulder Right 01/24/22  IMPRESSION:   Articular surface anterior supraspinatus tendon tear, right shoulder. Normal rotator cuff muscles.  Subdeltoid bursitis.  No axillary mass seen.    MRI Cervical Spine 07/09/2019  IMPRESSION:   Severe cervical spondylosis, most pronounced at left C3-4 facet arthropathy and C6-C7 disc-endplates. Further detailed above.  Focal T2 hyperintense signal at the C6-7 level which could be artifactual. Correlate with symptoms.       Current Outpatient Medications   Medication Sig Dispense Refill   ??? ADVAIR DISKUS 250-50 mcg/dose diskus Inhale 1 puff  in the morning and 1 puff in the evening.     ??? albuterol HFA 90 mcg/actuation inhaler      ??? aspirin (ECOTRIN) 81 MG tablet Take 1 tablet (81 mg total) by mouth daily. 150 tablet 2   ??? atorvastatin (LIPITOR) 20 MG tablet Take 1 tablet (20  mg total) by mouth daily. (Patient not taking: Reported on 11/26/2021) 30 tablet 11   ??? esomeprazole (NEXIUM) 40 MG capsule Take 1 capsule (40 mg total) by mouth daily.     ??? ferrous sulfate 325 (65 FE) MG tablet Take 1 tablet (325 mg total) by mouth Every Tuesday, Thursday, Saturday, Sunday. 30 tablet 3   ??? fluticasone propionate (FLONASE) 50 mcg/actuation nasal spray SPRAY 1 SPRAY INTO EACH NOSTRIL EVERY DAY (Patient not taking: Reported on 11/26/2021) 16 mL 0   ??? folic acid (FOLVITE) 1 MG tablet Take 1 tablet (1,000 mcg total) by mouth daily.     ??? ipratropium-albuterol (DUO-NEB) 0.5-2.5 mg/3 mL nebulizer Inhale 3 mL by nebulization every six (6) hours as needed. 360 mL 11   ??? isosorbide mononitrate (IMDUR) 30 MG 24 hr tablet Take 30 mg by mouth daily. (Patient not taking: Reported on 11/26/2021)     ??? propranoloL (INDERAL) 40 MG tablet Take 1 tablet (40 mg total) by mouth in the morning.     ??? ruxolitinib (JAKAFI) 15 mg tablet Take 1 tablet (15 mg total) by mouth Two (2) times a day. 60 tablet 6   ??? SPIRIVA WITH HANDIHALER 18 mcg inhalation capsule INHALE 1 CAP INHALATION ONCE DAILY     ??? TRINTELLIX 20 mg tablet TAKE 1 TABLET BY MOUTH EVERY DAY IN THE MORNING     ??? venlafaxine (EFFEXOR-XR) 150 MG 24 hr capsule TAKE 2 CAPSULES BY MOUTH EVERY MORNING       No current facility-administered medications for this visit.       Allergies:   Lyrica [pregabalin], Trazodone, Gabapentin, and Tramadol    Past Medical / Surgical History:     Past Medical History:   Diagnosis Date   ??? Abnormal findings on esophagogastroduodenoscopy (EGD) 12/16/2013    For abdominal discomfort; DDAVP; with EUS; no abnormal findings, and pancreas unremarkable   ??? Adult ADHD    ??? ANA positive    ??? Anxiety    ??? Asthma    ??? Atony of bladder    ??? Bipolar disorder (CMS-HCC)    ??? Chronic interstitial cystitis    ??? Chronic pain syndrome    ??? COPD (chronic obstructive pulmonary disease) (CMS-HCC)    ??? Depression    ??? Diabetes (CMS-HCC)    ??? Difficult intravenous access    ??? Dyspareunia    ??? Endometriosis    ??? Female stress incontinence    ??? Gastric ulcer    ??? GERD (gastroesophageal reflux disease)    ??? Gross hematuria    ??? H/O colonoscopy 12/2011   ??? Incomplete bladder emptying    ??? Incontinence without sensory awareness    ??? Insomnia    ??? Other chronic cystitis    ??? Other functional disorder of bladder    ??? Pain medication agreement signed 08/19/2012   ??? Polycythemia vera (CMS-HCC)    ??? Polycythemia vera(238.4)    ??? Scoliosis    ??? Urge incontinence    ??? Urinary frequency    ??? Von Willebrand's disease        Past Surgical History:   Procedure Laterality Date   ??? APPENDECTOMY     ??? BREAST BIOPSY Right     age 67     benign   ??? HYSTERECTOMY  1999    total   ??? InterStim Device  2003    Bladder Stimulator   ??? OOPHORECTOMY     ??? PR IMPLANT PERIPH/GASTRIC NEUROSTIM/RECEIVER Right 02/06/2015  Procedure: INSERTION OR REPLACEMENT PERIPHERAL/GASTRIC NEUROSTIMULATOR PULSE GENERAT/RECEIVE, DIRECT/INDUCTIV COUPLING;  Surgeon: Smith Robert, MD;  [facility];  Service: Urology   ??? PR REVISE/REMOVE PERIPH/GASTRIC NEUROSTIM/RECEIVER N/A 09/22/2017    Procedure: REVISION OR REMOVAL OF PERIPHERAL OR GASTRIC NEUROSTIMULATOR PULSE GENERATOR OR RECEIVER;  Surgeon: Norval Morton, MD;  [facility] OR [facility];  Service: Urology   ??? PR REVISE/REMOVE PERIPHERAL NEUROELECTRODE N/A 09/22/2017    Procedure: REVISION OR REMOVAL OF PERIPHERAL NEUROSTIMULATOR ELECTRODE ARRAY;  Surgeon: Norval Morton, MD;  [facility]: [facility] OR [facility];  Service: Urology   ??? ROTATOR CUFF REPAIR Left     x 2   ??? TOTAL KNEE ARTHROPLASTY Left 2006    Given Humate-P       Social History     Socioeconomic History   ??? Marital status: Single   Tobacco Use   ??? Smoking status: Former     Packs/day: 0.50     Years: 30.00     Pack years: 15.00     Types: Cigarettes     Start date: 05/13/1999     Quit date: 11/11/2016     Years since quitting: 5.4   ??? Smokeless tobacco: Never   ??? Tobacco comments:     Quit but started back   Vaping Use   ??? Vaping Use: Never used   Substance and Sexual Activity   ??? Alcohol use: No     Alcohol/week: 0.0 standard drinks     Comment: denies   ??? Drug use: No   ??? Sexual activity: Not Currently       Family History   Problem Relation Age of Onset   ??? Hyperlipidemia Sister    ??? GU problems Mother    ??? Cancer Mother 47        Bladder cancer   ??? Cancer Father 69        Colon cancer   ??? Prostate cancer Neg Hx    ??? Kidney cancer Neg Hx    ??? Urolithiasis Neg Hx        For any of the above entries which indicate no records on file, the patient reports no relevant history.                 Review of Systems:   Review of Systems was completed through a 10 organ system review and is listed in the chart.  Pertinent positives are noted in HPI or flowsheet and otherwise negative.  Patient has been instructed to followup with PCP or appropriate specialist for symptoms outside the purview of this speciality.    OBJECTIVE:     Vitals:     There were no vitals taken for this visit.    The above medications, allergies, history, and ROS, and vitals have been reviewed.    Physical Exam:   GEN: alert and oriented, no apparent distress  HEENT: normocephalic, atraumatic, anicteric, moist mucous membranes  CV: normal heart rate  PULM: normal work of breathing  GI: nondistended  EXT: no swelling, edema in b/l UE and LE  SKIN: no visible ecchymosis or breakdown  PSYCH: normal mood and affect    NEURO:   Manual Muscle Testing    Left Upper Extremity Right Upper Extremity   Shoulder Abduction 5/5 5/5   Elbow Flexion 5/5 5/5   Elbow Extension  5/5 5/5   Wrist Extension  5/5 5/5   Finger Abduction  5/5 5/5   Grip Strength  5/5  5/5           Left Lower Extremity Right Lower Extremity   Hip Flexion  5/5 5/5   Knee Extension  5/5 5/5   Knee Flexion 5/5 5/5   Ankle Dorsiflexion  5/5 5/5   Ankle Plantarflexion  5/5 5/5   EHL Extension  5/5 5/5      Able to complete single leg squats bilaterally.    Heel walking:    Toe walking:     Reflexes     Left Upper Extremity Right Upper Extremity    Triceps 2+ 2+   Biceps 2+ 2+   Brachioradialis 2+ 2+   Hoffman's  negative negative        Left Lower Extremity Right Lower Extremity    Patellar 2+ 2+   Achilles  2+ 2+   Ankle Clonus negative negative   Babinski negative negative     Sensory  Sensation to light touch WNL throughout b/l UE and LE    MSK:  Gait and Station: normal nonantalgic gait with symmetric body posture    Cervical Spine:   Inspection: Normal alignment. No erythema, discoloration, or asymmetry.  Palpation: No paraspinal muscle tendereness.  No vertebral body point tenderness.  ROM: normal flexion, extension, rotation, lateral bend  Spurling's: - (negative) LUE, - (negative) RUE    Lumbar Spine:  Inspection: Normal alignment. No erythema, discoloration, or asymmetry.  Palpation: No paraspinal muscle tenderness.  No vertebral body point tenderness.  ROM: normal flexion, extension, rotation, lateral bend   Facet Loading Pain: - (negative) left, - (negative) right  Straight Leg Raise: - (negative) LLE, - (negative) RLE    Sacroiliac Joint:   Inspection: Normal alignment. No erythema, discoloration, or asymmetry.  Palpation: No tenderness at PSIS  Patricks: - (negative) LLE, - (negative) RLE  Active assisted straight leg raise: - (negative) LLE, - (negative) RLE    Hip:  Inspection: Normal alignment. No erythema, discoloration, or asymmetry.  Palpation: No tenderness to palpation.  ROM: normal flexion, extension, internal/external rotation  FABER: - (negative) LLE, - (negative) RLE  IR/Scour: - (negative) LLE, - (negative) RLE    Piriformis:  Tenderness to palpation:  Pain with activation:  Pain with stretch:    Shoulder:  Inspection: Normal alignment. No erythema, discoloration, or asymmetry.  No atrophy.  Palpation: No tenderness to palpation.  ROM: normal flexion, extension, internal rotation, external rotation  Neer's: - (negative) LUE, - (negative) RUE  Hawkin's: - (negative) LUE, - (negative) RUE  Empty Can: - (negative) LUE, - (negative) RUE  Speed's:  - (negative) LUE, - (negative) RUE  Cross Arm Adduction: - (negative) LUE, - (negative) RUE    ----------------------------------------------------------------------------------------------------------------------  April 10, 2022 9:48 AM. Documentation assistance provided by Winferd Humphrey, medical scribe, at the direction of Valeda Malm, MD.  ----------------------------------------------------------------------------------------------------------------------      Tia Masker, MD  Assistant Professor - PM&R  Musculoskeletal and Spine Specialist - [facility] [employer]

## 2022-04-09 NOTE — Unmapped (Signed)
[facility] ([facility]  Psycho-Oncology Outreach Note    Referral information  Courtney Erickson was referred to the [facility] Psycho-Oncology service for counseling/therapy     Summary: [name] is a 56 y.o. diagnosed with Polycythemia vera. The patient reports a hx of depression, anxiety.     Referral outcome: The patient has an appointment with Maryagnes Amos.     The patient was informed of how to reschedule their appointment and that it is their responsibility to verify with their insurance provider if they have questions or concerns about service coverage.    Thank you for allowing me to participate in the care of this patient.    [employer], MSW.

## 2022-04-18 MED ORDER — VENLAFAXINE ER 150 MG CAPSULE,EXTENDED RELEASE 24 HR
ORAL_CAPSULE | Freq: Every day | ORAL | 0 refills | 30 days | Status: CP
Start: 2022-04-18 — End: ?

## 2022-04-18 NOTE — Unmapped (Signed)
The [facility] has made a second and final attempt to reach this patient to refill the following medication:Jakafi.      We have been unable to leave messages on the following phone numbers: [phone], have sent a MyChart message, and have sent a text message to the following phone numbers: [phone] .    Dates contacted: 6/6,12  Last scheduled delivery: 03/13/22    The patient may be at risk of non-compliance with this medication. The patient should call the [facility] at [phone]  Option 4, then Option 1 (oncology) to refill medication.    [name] Margretta Ditty [facility] Specialty Technician  07/22/22

## 2022-04-23 NOTE — Unmapped (Signed)
Patient did not show for her scheduled psychiatry visit through [facility]. Voice mail message left with instructions on how to reschedule.

## 2022-04-25 MED ORDER — FERROUS SULFATE 325 MG (65 MG IRON) TABLET
ORAL_TABLET | ORAL | 3 refills | 52 days | Status: CP
Start: 2022-04-25 — End: 2022-11-21

## 2022-04-25 NOTE — Unmapped (Signed)
Called and left message for patient re: missed appointments with [facility].

## 2022-04-26 ENCOUNTER — Encounter: Payer: Self-pay | Admitting: Family Medicine

## 2022-05-01 NOTE — Unmapped (Signed)
[facility] has made a third attempt to reach this patient to refill the following medication:Jakafi.       We have left a voice message on the following numbers: [phone].  We have been unable to leave messages on the following phone numbers: [phone] (VM Box full), have sent a MyChart message, and have sent a text message to the following phone numbers: [phone] .     Dates contacted: 6/6,12, 05/01/22  Last scheduled delivery: 03/13/22     The patient may be at risk of non-compliance with this medication. The patient should call the [facility] at [phone]  Option 4, then Option 1 (oncology) to refill medication    Horace Porteous, PharmD  [facility],e

## 2022-07-02 ENCOUNTER — Other Ambulatory Visit: Admit: 2022-07-02 | Discharge: 2022-07-03 | Payer: MEDICARE

## 2022-07-02 ENCOUNTER — Ambulatory Visit
Admit: 2022-07-02 | Discharge: 2022-07-03 | Payer: MEDICARE | Attending: Hematology & Oncology | Primary: Hematology & Oncology

## 2022-07-02 DIAGNOSIS — R799 Abnormal finding of blood chemistry, unspecified: Principal | ICD-10-CM

## 2022-07-02 DIAGNOSIS — D45 Polycythemia vera: Principal | ICD-10-CM

## 2022-07-02 LAB — COMPREHENSIVE METABOLIC PANEL
ALBUMIN: 3.7 g/dL (ref 3.4–5.0)
ALKALINE PHOSPHATASE: 69 U/L (ref 46–116)
ALT (SGPT): 49 U/L (ref 10–49)
ANION GAP: 5 mmol/L (ref 5–14)
AST (SGOT): 68 U/L — ABNORMAL HIGH (ref <=34)
BILIRUBIN TOTAL: 0.8 mg/dL (ref 0.3–1.2)
BLOOD UREA NITROGEN: 14 mg/dL (ref 9–23)
BUN / CREAT RATIO: 23
CALCIUM: 9.3 mg/dL (ref 8.7–10.4)
CHLORIDE: 107 mmol/L (ref 98–107)
CO2: 28 mmol/L (ref 20.0–31.0)
CREATININE: 0.6 mg/dL
EGFR CKD-EPI (2021) FEMALE: >90 mL/min/{1.73_m2} (ref >=60)
GLUCOSE RANDOM: 108 mg/dL (ref 70–179)
POTASSIUM: 5.4 mmol/L — ABNORMAL HIGH (ref 3.5–5.1)
PROTEIN TOTAL: 6.7 g/dL (ref 5.7–8.2)
SODIUM: 140 mmol/L (ref 135–145)

## 2022-07-02 LAB — CBC W/ AUTO DIFF
BASOPHILS ABSOLUTE COUNT: 0.2 10*9/L — ABNORMAL HIGH (ref 0.0–0.1)
BASOPHILS RELATIVE PERCENT: 1.6 %
EOSINOPHILS ABSOLUTE COUNT: 0.5 10*9/L (ref 0.0–0.5)
EOSINOPHILS RELATIVE PERCENT: 4.1 %
HEMATOCRIT: 38.6 % (ref 34.0–44.0)
HEMOGLOBIN: 13 g/dL (ref 11.3–14.9)
LYMPHOCYTES ABSOLUTE COUNT: 1.7 10*9/L (ref 1.1–3.6)
LYMPHOCYTES RELATIVE PERCENT: 13.5 %
MEAN CORPUSCULAR HEMOGLOBIN CONC: 33.7 g/dL (ref 32.0–36.0)
MEAN CORPUSCULAR HEMOGLOBIN: 28.8 pg (ref 25.9–32.4)
MEAN CORPUSCULAR VOLUME: 85.4 fL (ref 77.6–95.7)
MEAN PLATELET VOLUME: 9.6 fL (ref 6.8–10.7)
MONOCYTES ABSOLUTE COUNT: 0.9 10*9/L — ABNORMAL HIGH (ref 0.3–0.8)
MONOCYTES RELATIVE PERCENT: 7.2 %
NEUTROPHILS ABSOLUTE COUNT: 9 10*9/L — ABNORMAL HIGH (ref 1.8–7.8)
NEUTROPHILS RELATIVE PERCENT: 73.6 %
PLATELET COUNT: 419 10*9/L (ref 150–450)
RED BLOOD CELL COUNT: 4.52 10*12/L (ref 3.95–5.13)
RED CELL DISTRIBUTION WIDTH: 17.1 % — ABNORMAL HIGH (ref 12.2–15.2)
WBC ADJUSTED: 12.2 10*9/L — ABNORMAL HIGH (ref 3.6–11.2)

## 2022-07-02 LAB — LACTATE DEHYDROGENASE: LACTATE DEHYDROGENASE: 265 U/L — ABNORMAL HIGH (ref 120–246)

## 2022-07-02 LAB — IRON & TIBC
IRON SATURATION: 58 % — ABNORMAL HIGH (ref 20–55)
IRON: 193 ug/dL — ABNORMAL HIGH
TOTAL IRON BINDING CAPACITY: 330 ug/dL (ref 250–425)

## 2022-07-02 LAB — LIPASE: LIPASE: 38 U/L (ref 12–53)

## 2022-07-02 LAB — LIPID PANEL
CHOLESTEROL/HDL RATIO SCREEN: 4.2 (ref 1.0–4.5)
CHOLESTEROL: 148 mg/dL (ref <=200)
HDL CHOLESTEROL: 35 mg/dL — ABNORMAL LOW (ref 40–60)
LDL CHOLESTEROL CALCULATED: 50 mg/dL (ref 40–99)
NON-HDL CHOLESTEROL: 113 mg/dL (ref 70–130)
TRIGLYCERIDES: 317 mg/dL — ABNORMAL HIGH (ref 0–150)
VLDL CHOLESTEROL CAL: 63.4 mg/dL — ABNORMAL HIGH (ref 11–40)

## 2022-07-02 LAB — FERRITIN: FERRITIN: 23.3 ng/mL

## 2022-07-02 MED ORDER — RUXOLITINIB 15 MG TABLET
ORAL_TABLET | Freq: Two times a day (BID) | ORAL | 11 refills | 30 days | Status: CP
Start: 2022-07-02 — End: 2023-06-27
  Filled 2022-07-30: qty 60, 30d supply, fill #0

## 2022-07-02 NOTE — Unmapped (Addendum)
PLAN:  1) Refer to urogynecology - these doctors who specialize in the urological problems with women  2) Iron tablets: Take these for another 6 months.   3) I like the lower amount of Tylenol  -   4) Let's get an ultrasound of your liver and spleen, This will be a marker for your PCV.     ASSESSMENT:  1) Your blood looks great.  2) Your bladder are the most pressing.   3) Your symptoms of PCV seem to be coming back; your LDH is slightly elevated.  These are minor issues.     IF you do not hear about the test results, call the clinic.      All lab results last 24 hours:    Recent Results (from the past 24 hour(s))   CBC w/ Differential    Collection Time: 07/02/22 10:39 AM   Result Value Ref Range    WBC 12.2 (H) 3.6 - 11.2 10*9/L This is too small an increase to be worried     RBC 4.52 3.95 - 5.13 10*12/L    HGB 13.0 11.3 - 14.9 g/dL    HCT 16.1 09.6 - 04.5 % We like this to be < 45    MCV 85.4 77.6 - 95.7 fL    MCH 28.8 25.9 - 32.4 pg    MCHC 33.7 32.0 - 36.0 g/dL    RDW 40.9 (H) 81.1 - 15.2 %    MPV 9.6 6.8 - 10.7 fL    Platelet 419 150 - 450 10*9/L These are normal    Neutrophils % 73.6 %    Lymphocytes % 13.5 %    Monocytes % 7.2 %    Eosinophils % 4.1 %    Basophils % 1.6 %    Absolute Neutrophils 9.0 (H) 1.8 - 7.8 10*9/L     Absolute Lymphocytes 1.7 1.1 - 3.6 10*9/L    Absolute Monocytes 0.9 (H) 0.3 - 0.8 10*9/L    Absolute Eosinophils 0.5 0.0 - 0.5 10*9/L    Absolute Basophils 0.2 (H) 0.0 - 0.1 10*9/L    Anisocytosis Slight (A) Not Present     All lab results last 24 hours:    Recent Results (from the past 24 hour(s))   Comprehensive Metabolic Panel    Collection Time: 07/02/22 10:39 AM   Result Value Ref Range    Sodium 140 135 - 145 mmol/L    Potassium 5.4 (H) 3.5 - 5.1 mmol/L    Chloride 107 98 - 107 mmol/L    CO2 28.0 20.0 - 31.0 mmol/L    Anion Gap 5 5 - 14 mmol/L    BUN 14 9 - 23 mg/dL    Creatinine 9.14 7.82 - 0.80 mg/dL    BUN/Creatinine Ratio 23     eGFR CKD-EPI (2021) Female >90 >=60 mL/min/1.53m2 Glucose 108 70 - 179 mg/dL    Calcium 9.3 8.7 - 95.6 mg/dL    Albumin 3.7 3.4 - 5.0 g/dL    Total Protein 6.7 5.7 - 8.2 g/dL    Total Bilirubin 0.8 0.3 - 1.2 mg/dL    AST 68 (H) <=21 U/L This is better - these are the liver numbers    ALT 49 10 - 49 U/L    Alkaline Phosphatase 69 46 - 116 U/L   Lactate dehydrogenase    Collection Time: 07/02/22 10:39 AM   Result Value Ref Range    LDH 265 (H) 120 - 246 U/L   Iron & TIBC  Collection Time: 07/02/22 10:39 AM   Result Value Ref Range    Iron 193 (H) 50 - 170 ug/dL    TIBC 161 096 - 045 ug/dL    Iron Saturation (%) 58 (H) 20 - 55 %   Ferritin    Collection Time: 07/02/22 10:39 AM   Result Value Ref Range    Ferritin 23.3 7.3 - 270.7 ng/mL This is the best measurement for total body iron = its normal   Lipid Panel    Collection Time: 07/02/22 10:39 AM   Result Value Ref Range    Triglycerides 317 (H) 0 - 150 mg/dL    Cholesterol 409 <=811 mg/dL    HDL 35 (L) 40 - 60 mg/dL    LDL Calculated 50 40 - 99 mg/dL    VLDL Cholesterol Cal 63.4 (H) 11 - 40 mg/dL    Chol/HDL Ratio 4.2 1.0 - 4.5    Non-HDL Cholesterol 113 70 - 130 mg/dL    FASTING Unknown

## 2022-07-02 NOTE — Unmapped (Unsigned)
ID: Courtney Erickson is a 56 y.o. with a h/o PCV     DZ CHAR: PCV  ?? BM: NA  ?? Molecular WU   ?? JAK2 (Val617Phe): 21.9%  ?? 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 Clotting Risk (include variables): Low Risk  ?? No h/o thrombosis  ?? JAK2+  ?? Age < 60  ?? CV Risk Factors: Yes  ?? Tx: Aspirin  TX CHAR:  ??? Hydrea: Changed for lack of symptom control  ??? Ruxolitinib: 15 mg BID    RISK ASSESSMENT:  ?? 10 yr overall survival: 94%; AML: 3%; MF: 5%    ?? 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 This treatment controlled her counts, but did not relieve many of the associated MPN symptoms.  For this reason, she was started on ruxolitinib.  At 15 mg BID, she continues to have good control of her counts. Initially, I felt that she was also getting symptomatic improvement from her ruxolitinib, but that is less clear now.  To complicate matters, many of her symptoms do not appear to be related to her MPN.  We should get an [place] to assess her spleen size. This may be a better marker for disease control.      It appears that most of her abdominal pain is actually suprapubic pain related possibly to hemorrhagic cystitis.  I am not quite sure why it has taken [place] this long to get this history, but it appears this is her most pressing symptom.      She is recovering from her iron deficiency.  Her transaminases appear to be improving.     PLAN:  1) Continue ruxolitinib 15 mg BID  2) Continue ferrous Sulfate 325 mg three times per week through March 2024  3) Refer to urogynecology for possible hemorrhagic cystitis   4) Continue ASA 81mg  daily for now  5) [place] to assess spleen size; we can also assess her liver for NASH.   6) RTC in 3 months    HEME HX  06/2012: Dx with PCV  ?? Possible splenomegaly  ?? Started on phlebotomy  ?? 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%    09/2015: Transitioned to [facility]; held when platelets normalized  05/2019: Normal colonoscopy; Grade I reflux  09/2019: CBC: 15.8/14.0/596; Restarted Hydrea 500 mg every day   10/2019: 12.7/13.2/591; Increased Hydrea to 1000 mg every day   05/04/20: 5.5/12.8/218; CD34: 0.04  08/07/20:   Variants of Known/Likely Clinical Significance:   Gene Coding Predicted Protein Variant allele fraction   JAK2 c.1849G>T p.(Val617Phe) 21.9 %   PTPN11 c.178G>A p.(Gly60Ser) 48.5 %     11/09/20: 5.6/13.2/279;   11/24/20: Began Ruxolitinib 10 mg BID; DC'd hydrea  12/14/20: Inc ruxolitinib 15 mg BID  03/22/21: 6.5/12.2/344  11/26/21: 8.6/11.3/395 ANC 5.6  01/24/22: 13.2/9.6/591; ferritin: 5.3  07/02/22: 12,2/13.0/38.6/419;   ?? Ferritin: 23.3  ?? AST: 68; ALT: 49  ?? LDL: 50; TG 317    INTERVAL HX  Courtney Hoyland comes for FU of her PCV.  She notes running out [place].  She has restarted it and she does feel better. We talked about the following:     ?? Abd pain  ?? She has a long h/o abdominal pain.  She has taken Tylenol about 4 times a day.  She notes that the pain does not occur every day  ??  The pain is predominantly suprapubic, R>L  ?? No change in the pain with bowel habits  ?? The pain wakes up from sleep   ?? She notes the urination causes the pain to increase   ?? She notes blood in urine   ?? She has been treated in the past with steroids/levaquin; these have been effective  ?? She has a h/o interstitial cystitis and even had an interstim device; she also has had cystopexy   ?? Shoulder Pain:  She was seen by PM&R and this feels better.    ?? Mental Health: She is currently on no medications.      MPN 10 Score  (App: [email or web address])   [email or web address]  (Useful for monitoring patient symptoms.    Symptom Range 7/21 1/22 2/17  5/26 10/31 04/08/22 06/2022   Fatigue in the past 24 hours (Absent) 0 1 2 3 4 5 6 7 8 9  10 (Worst Imaginable) 10 10 10+ 10 10 10  8  Wants to just lie there - intertia Filling up quickly when you eat (Early satiety)  (Absent) 0 1 2 3 4 5 6 7 8 9  10 (Worst Imaginable) 0 0 0 0 0 5 Feels full all the time - 3 bites 9   No weight loss - no reflux =-eat and get sick  No change in stools     Abdominal discomfort  (Absent) 0 1 2 3 4 5 6 7 8 9  10 (Worst Imaginable) 0 0 0 0 0 0 0   Inactivity (Absent) 0 1 2 3 4 5 6 7 8 9  10 (Worst Imaginable) 8 8 8 10 5 5 10    Problems with concentration - (Absent) 0 1 2 3 4 5 6 7 8 9  10 (Worst Imaginable) 5 8 5 10  0 10 0   Numbness/ Tingling (in my hands and feet) (Absent) 0 1 2 3 4 5 6 7 8 9  10 (Worst Imaginable) 3 3 3 3 7  0 0   Night sweats (Absent) 0 1 2 3 4 5 6 7 8 9  10 (Worst Imaginable) 8 8 0 3 8 10  Breaks out in sweat - 8   Itching (pruritus) (Absent) 0 1 2 3 4 5 6 7 8 9  10 (Worst Imaginable) 4  Night itch  aquagenic pruritus;  4 0  Sleep is better  3 0 0 10 - this symptom is back  On remeron - 6 years age     Bone pain (diffuse not joint pain or arthritis) (Absent) 0 1 2 3 4 5 6 7 8 9  10 (Worst Imaginable) 3 Severe but infreq  2   0 0 0 5 0   Fever (>100 F) (Absent) 0 1 2 3 4 5 6 7 8 9  10 (Daily) 0 0 0 0 0 0 0   Unintentional weight loss last 6 months (Absent) 0 1 2 3 4 5 6 7 8 9  10 (Worst Imaginable) 0 0 0 0 0 0 0   Total  41 43 26 42 30 45 45     PHYSICAL EXAM:  VS: As recorded in Epic  GENERAL: She appears reasonably well   HEENT: OP is clear  LYMPH NODES: No LAN  LUNGS: CTA  COR:RRR  ABD: NTND; ? Splenomegaly   EXT: No edema    PMHx:   ??  VWD: Responsive to DDAVP (2005)  ?? H/O bleeding with childbirth, dental extractions, menstrual bleeding  ?? VWF measured as low as 20% and as  high as 84%  ?? Responds to DDAVP  ?? Txed with Humate P75 U/kg BID for 5 days  ?? Lung cancer screening   ?? 12/19/17: Pulmonary nodules: Stable, Lung-RADS Category 2.  ?? 01/18/19: LD CT Chest: Few unchanged subcentimeter pulmonary nodules. No new nodule.   Lung-RADS: 2  ?? Severe cervical spondylosis (9/20)  ?? Colonoscopy (2016): single polyp  ?? Bladder disease: atony, chronic interstitial cystitis, with implanted bladder stimulator  ?? S/P hysterectomy for endometriosis  ?? Psychiatric disease, stated history of major depression; h/o Abilify, Wellbutrin, BuSpar, Adderall, Valium; on Latuda, gabapentin, Topamax, Viibryd, Abilify (txed by [facility]  ?? GERD, gastric ulcer, epigastric pain, with negative EGD and EUS for pancreatic disease (8/20)  ?? Mammogram (01/2017): BI-RADS 2, 1 year follow-up recommended/scheduled  ?? Nl Cardiac Cath (9/17)   ?? COPD;  smoker (30 pk yr), on Advair and DuoNeb;   ?? PFTs (12/15)  ?? FVC was 2.27 liters, 79% of predicted  FEV1 was 1.86, 78% of predicted  FEV1 ratio was 82  FEF 25-75% liters per second was 88% of predicted    LUNG VOLUMES:  TLC was 71% of predicted  RV was 55% of predicted    DIFFUSION CAPACITY:  DLCO was 63% of predicted  DLCO/VA was 123% of predicted    FLOW VOLUME LOOP:  ? Expiratory flow volume is somewhat flattened   ?? BuSpar, Adderall, Valium; on Latuda, gabapentin, Topamax, Viibryd, Abilify (txed by [facility]  GERD, gastric ulcer, epigastric pain, with negative EGD and EUS for pancreatic disease (8/20)  Mammogram (01/2017): BI-RADS 2, 1 year follow-up recommended/scheduled  Nl Cardiac Cath (9/17)   COPD;  smoker (30 pk yr), on Advair and DuoNeb;   PFTs (12/15)  FVC was 2.27 liters, 79% of predicted  FEV1 was 1.86, 78% of predicted  FEV1 ratio was 82  FEF 25-75% liters per second was 88% of predicted    LUNG VOLUMES:  TLC was 71% of predicted  RV was 55% of predicted    DIFFUSION CAPACITY:  DLCO was 63% of predicted  DLCO/VA was 123% of predicted    FLOW VOLUME LOOP:  ? Expiratory flow volume is somewhat flattened

## 2022-07-02 NOTE — Unmapped (Unsigned)
Labs drawn via butterfly & sent for analysis.  To next appt.  Care provided by BB.

## 2022-07-11 ENCOUNTER — Other Ambulatory Visit: Payer: Self-pay | Admitting: Family Medicine

## 2022-07-11 DIAGNOSIS — R0602 Shortness of breath: Secondary | ICD-10-CM

## 2022-07-16 ENCOUNTER — Ambulatory Visit: Admit: 2022-07-16 | Discharge: 2022-07-17 | Payer: MEDICARE

## 2022-07-25 ENCOUNTER — Ambulatory Visit: Payer: Medicare Other | Attending: Family Medicine

## 2022-07-25 DIAGNOSIS — R0602 Shortness of breath: Secondary | ICD-10-CM | POA: Insufficient documentation

## 2022-07-25 LAB — PULMONARY FUNCTION TEST ARMC ONLY
DL/VA % pred: 93 %
DL/VA: 4.12 ml/min/mmHg/L
DLCO unc % pred: 81 %
DLCO unc: 14.12 ml/min/mmHg
FEF 25-75 Post: 2.67 L/sec
FEF 25-75 Pre: 1.76 L/sec
FEF2575-%Change-Post: 51 %
FEF2575-%Pred-Post: 119 %
FEF2575-%Pred-Pre: 78 %
FEV1-%Change-Post: 10 %
FEV1-%Pred-Post: 68 %
FEV1-%Pred-Pre: 61 %
FEV1-Post: 1.5 L
FEV1-Pre: 1.35 L
FEV1FVC-%Change-Post: -4 %
FEV1FVC-%Pred-Pre: 108 %
FEV6-%Change-Post: 15 %
FEV6-%Pred-Post: 66 %
FEV6-%Pred-Pre: 57 %
FEV6-Post: 1.83 L
FEV6-Pre: 1.58 L
FEV6FVC-%Pred-Post: 103 %
FEV6FVC-%Pred-Pre: 103 %
FVC-%Change-Post: 15 %
FVC-%Pred-Post: 64 %
FVC-%Pred-Pre: 56 %
FVC-Post: 1.83 L
FVC-Pre: 1.58 L
Post FEV1/FVC ratio: 82 %
Post FEV6/FVC ratio: 100 %
Pre FEV1/FVC ratio: 86 %
Pre FEV6/FVC Ratio: 100 %
RV % pred: 142 %
RV: 2.36 L
TLC % pred: 111 %
TLC: 4.81 L

## 2022-07-25 MED ORDER — ALBUTEROL SULFATE (2.5 MG/3ML) 0.083% IN NEBU
2.5000 mg | INHALATION_SOLUTION | Freq: Once | RESPIRATORY_TRACT | Status: AC
  Administered 2022-07-25: 2.5 mg via RESPIRATORY_TRACT

## 2022-07-29 NOTE — Unmapped (Signed)
Specialty Medication(s): Ruxolitinib    Ms.Howald has been dis-enrolled from the [facility] specialty pharmacy services due to multiple unsuccessful outreach attempts by the pharmacy.    Additional information provided to the patient: Unable to contact patient after several attempts to refill ruxolitinib.  [facility] last filled medication on 04/19/22 x 30 day supply.    Kermit Balo, [facility]  [facility] Specialty Pharmacist

## 2022-07-30 NOTE — Unmapped (Signed)
[facility] Clinical Assessment & Refill Coordination Note    Courtney Erickson, DOB: 11/25/1965  Phone: [phone] (home)     All above HIPAA information was verified with patient.     Was a Nurse, learning disability used for this call? No    Specialty Medication(s):   Hematology/Oncology: [id number]     Current Outpatient Medications   Medication Sig Dispense Refill    ADVAIR DISKUS 250-50 mcg/dose diskus Inhale 1 puff  in the morning and 1 puff in the evening.      albuterol HFA 90 mcg/actuation inhaler       aspirin (ECOTRIN) 81 MG tablet Take 1 tablet (81 mg total) by mouth daily. 150 tablet 2    atorvastatin (LIPITOR) 20 MG tablet Take 1 tablet (20 mg total) by mouth daily. (Patient not taking: Reported on 11/26/2021) 30 tablet 11    esomeprazole (NEXIUM) 40 MG capsule Take 1 capsule (40 mg total) by mouth daily.      ferrous sulfate 325 (65 FE) MG tablet Take 1 tablet (325 mg total) by mouth Every Tuesday, Thursday, Saturday, Sunday. 30 tablet 3    ipratropium-albuterol (DUO-NEB) 0.5-2.5 mg/3 mL nebulizer Inhale 3 mL by nebulization every six (6) hours as needed. 360 mL 11    propranoloL (INDERAL) 40 MG tablet Take 1 tablet (40 mg total) by mouth in the morning.      ruxolitinib (JAKAFI) 15 mg tablet Take 1 tablet (15 mg total) by mouth Two (2) times a day. 60 tablet 11    SPIRIVA WITH HANDIHALER 18 mcg inhalation capsule INHALE 1 CAP INHALATION ONCE DAILY       No current facility-administered medications for this visit.        Changes to medications: Courtney Erickson reports no changes at this time.    Allergies   Allergen Reactions    Lyrica [Pregabalin] Other (See Comments)     Unsteady gate, causing falls    Trazodone      weakness    Gabapentin Nausea And Vomiting    Tramadol Rash     Changes to allergies: No    SPECIALTY MEDICATION ADHERENCE     [name]: 0 days of medicine on hand     Medication Adherence    Patient reported X missed doses in the last month: all  Specialty Medication: [name] - 1 tablet twice daily  Patient is on additional specialty medications: No  Informant: patient  Reliability of informant: unreliable  Provider-estimated medication adherence level: poor                  Confirmed plan for next specialty medication refill: delivery by pharmacy  Refills needed for supportive medications: not needed          Specialty medication(s) dose(s) confirmed: Regimen is correct and unchanged.     Are there any concerns with adherence? Yes: pt has been out of medication x almost 2 months.  Pharmacy was unable to contact patient and patient reported she was not able to contact pharmacy.  Discussed calling pharmacy when down to less than 7 day supply of medication.  Provided [facility] phone number and notified patient that pharmacy will also call her monthly to schedule her refills.    Adherence counseling provided?  Discussed importance of taking medication as prescribed    CLINICAL MANAGEMENT AND INTERVENTION      Clinical Benefit Assessment:    Do you feel the medicine is effective or helping  your condition?  NA - hasn't taken in  2 months    Clinical Benefit counseling provided?  NA    Adverse Effects Assessment:    Are you experiencing any side effects?  NA    Are you experiencing difficulty administering your medicine?  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 For example, brushing your teeth or getting up in the morning. 0    Have you discussed this with your provider? Yes    Acute Infection Status:    Acute infections noted within Epic:  No active infections  Patient reported infection: None    Therapy Appropriateness:    Is therapy appropriate and patient progressing towards therapeutic goals? Yes, therapy is appropriate and should be continued    DISEASE/MEDICATION-SPECIFIC INFORMATION N/A    Oncology: Is the patient receiving adequate infection prevention treatment? Not applicable  Does the patient have adequate nutritional support? Not applicable    PATIENT SPECIFIC NEEDS     Does the patient have any physical, cognitive, or cultural barriers? No    Is the patient high risk? No    Did the patient require a clinical intervention? No    Does the patient require physician intervention or other additional services (i.e., nutrition, smoking cessation, social work)? No    SOCIAL DETERMINANTS OF HEALTH     At the [facility], we have learned that life circumstances - like trouble affording food, housing, utilities, or transportation can affect the health of many of our patients.   That is why we wanted to ask: are you currently experiencing any life circumstances that are negatively impacting your health and/or quality of life? No    Social Determinants of Psychologist, prison and probation services Strain: Not on file   Internet Connectivity: Not on file   Food Insecurity: Not on file   Tobacco Use: Medium Risk (08/27/2021)    Patient History     Smoking Tobacco Use: Former     Smokeless Tobacco Use: Never     Passive Exposure: Not on file   Housing/Utilities: Not on file   Alcohol Use: Not on file   Transportation Needs: Not on file   Substance Use: Not on file   Health Literacy: Not on file   Physical Activity: Not on file   Interpersonal Safety: Not on file   Stress: Not on file   Intimate Partner Violence: Not on file   Depression: Not on file   Social Connections: Not on file     Would you be willing to receive help with any of the needs that you have identified today? Not applicable     SHIPPING     Specialty Medication(s) to be Shipped:   Hematology/Oncology: [name]    Other medication(s) to be shipped: No additional medications requested for fill at this time     Changes to insurance: No    Delivery Scheduled: Yes, Expected medication delivery date: 07/31/22.     Medication will be delivered via Next Day Courier to the confirmed prescription address in [facility].    The patient will receive a drug information handout for each medication shipped and additional FDA Medication Guides as required.  Verified that patient has previously received a Welcome  Packet and a Surveyor, mining.    The patient or caregiver noted above participated in the development of this care plan and knows that they can request review of or adjustments to the care plan at any time.      All of the patient's questions and concerns have been addressed.    Kermit Balo, [facility]   [facility] Specialty Pharmacist

## 2022-08-20 MED ORDER — FERROUS SULFATE 325 MG (65 MG IRON) TABLET
ORAL_TABLET | ORAL | 3 refills | 53 days | Status: CP
Start: 2022-08-20 — End: 2023-03-18

## 2022-09-05 NOTE — Unmapped (Signed)
[facility] Refill Coordination Note    Specialty Medication(s) to be Shipped:   Hematology/Oncology: [name]    Other medication(s) to be shipped: No additional medications requested for fill at this time     Courtney Eric[facility]on, DOB: [birth year]  Phone: [phone] (home)       All above HIPAA information was verified with patient.     Was a Nurse, learning disability used for this call? No    Completed refill call assessment today to schedule patient's medication shipment from the [facility] [phone]).  All relevant notes have been reviewed.     Specialty medication(s) and dose(s) confirmed: Regimen is correct and unchanged.   Changes to medications: Courtney Erickson reports no changes at this time.  Changes to insurance: No  New side effects reported not previously addressed with a pharmacist or physician: None reported  Questions for the pharmacist: No    Confirmed patient received a Conservation officer, historic buildings and a Surveyor, mining with first shipment. The patient will receive a drug information handout for each medication shipped and additional FDA Medication Guides as required.       DISEASE/MEDICATION-SPECIFIC INFORMATION        N/A    SPECIALTY MEDICATION ADHERENCE     Medication Adherence    Patient reported X missed doses in the last month: 0  Specialty Medication: [name]  Patient is on additional specialty medications: No  Informant: patient                       Were doses missed due to medication being on hold? No    Jakafi 15mg   : 3 days of medicine on hand       REFERRAL TO PHARMACIST     Referral to the pharmacist: Not needed      [facility]     Shipping address confirmed in Epic.     Delivery Scheduled: Yes, Expected medication delivery date: 09/07/22.     Medication will be delivered via Next Day Courier to the prescription address in Epic [place].    Courtney Erickson   [facility] Specialty Technician

## 2022-09-06 MED FILL — JAKAFI 15 MG TABLET: ORAL | 30 days supply | Qty: 60 | Fill #1

## 2022-09-11 ENCOUNTER — Ambulatory Visit (INDEPENDENT_AMBULATORY_CARE_PROVIDER_SITE_OTHER): Payer: Medicare Other | Admitting: Student in an Organized Health Care Education/Training Program

## 2022-09-11 ENCOUNTER — Telehealth: Payer: Self-pay

## 2022-09-11 VITALS — BP 124/78 | HR 78 | Temp 97.9°F | Ht 59.0 in | Wt 166.0 lb

## 2022-09-11 DIAGNOSIS — R0602 Shortness of breath: Secondary | ICD-10-CM

## 2022-09-11 DIAGNOSIS — J432 Centrilobular emphysema: Secondary | ICD-10-CM

## 2022-09-11 MED ORDER — SPIRIVA RESPIMAT 2.5 MCG/ACT IN AERS: 2.0000 | INHALATION_SPRAY | Freq: Every day | RESPIRATORY_TRACT | 11 refills | Status: DC

## 2022-09-11 MED ORDER — FLUTICASONE FUROATE-VILANTEROL 100-25 MCG/ACT IN AEPB: 1.0000 | INHALATION_SPRAY | Freq: Every day | RESPIRATORY_TRACT | 11 refills | Status: AC

## 2022-09-11 NOTE — Progress Notes (Signed)
Synopsis: Referred in for shortness of breath by Kerri Perches, PA-C  Assessment & Plan:   #Shortness of breath #COPD (preserved ratio impaired spirometry) #Emphysema  Presents for the evaluation of shortness of breath with PFT's from 06/2022 showing an FEV1 of 1.5L (68%) post bronchodilator challenge with preserved ratio (FEV1/FVC of 0.86). She has normal TLC and signs of air trapping (RV/TLC of 142% predicted). DLCO is normal at 81% predicted. Lung cancer screening CT's also shows emphysema . 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 I will be treating her as such and optimize her inhaler therapy with LABA/LAMA/ICS. I will switch her tiotropium to respimat for ease of delivery of the LAMA and initiate LABA/ICS therapy with Breo. I will also place a referral to pulmonary rehabilitation. Finally, we will attempt to obtain Lauren Escobar's imaging from [facility] and import them into our system for comparison.  - Tiotropium Bromide Monohydrate (SPIRIVA RESPIMAT) 2.5 MCG/ACT AERS; Inhale 2 puffs into the lungs daily.  Dispense: 30 each; Refill: 11 - fluticasone furoate-vilanterol (BREO ELLIPTA) 100-25 MCG/ACT AEPB; Inhale 1 puff into the lungs daily.  Dispense: 30 each; Refill: 11 - AMB referral to pulmonary rehabilitation   Return in about 3 months (around 12/12/2022).  I spent 60 minutes caring for this patient today, including preparing to see the patient, obtaining and/or reviewing separately obtained history, performing a medically appropriate examination and/or evaluation, counseling and educating the patient/family/caregiver, ordering medications, tests, or procedures, documenting clinical information in the electronic health record, and independently interpreting results (not separately reported/billed) and communicating results to the patient/family/caregiver  Armando Reichert, MD [facility] 09/11/2022 10:32 AM    End of visit medications:  Meds  ordered this encounter  Medications   Tiotropium Bromide Monohydrate (SPIRIVA RESPIMAT) 2.5 MCG/ACT AERS    Sig: Inhale 2 puffs into the lungs daily.    Dispense:  30 each    Refill:  11   fluticasone furoate-vilanterol (BREO ELLIPTA) 100-25 MCG/ACT AEPB    Sig: Inhale 1 puff into the lungs daily.    Dispense:  30 each    Refill:  11     Current Outpatient Medications:    albuterol (VENTOLIN HFA) 108 (90 Base) MCG/ACT inhaler, Inhale into the lungs., Disp: , Rfl:    aspirin EC 81 MG tablet, Take by mouth., Disp: , Rfl:    atorvastatin (LIPITOR) 20 MG tablet, TAKE 1 TABLET BY MOUTH AT BEDTIME FOR HIGH CHOLESTEROL, Disp: , Rfl:    cetirizine (ZYRTEC) 10 MG tablet, TAKE 1 TABLET BY MOUTH DAILY FOR ALLERGIES, Disp: , Rfl:    diclofenac Sodium (VOLTAREN) 1 % GEL, , Disp: , Rfl:    esomeprazole (NEXIUM) 40 MG capsule, Take 40 mg by mouth daily. , Disp: , Rfl:    fluticasone furoate-vilanterol (BREO ELLIPTA) 100-25 MCG/ACT AEPB, Inhale 1 puff into the lungs daily., Disp: 30 each, Rfl: 11   folic acid (FOLVITE) 1 MG tablet, Take 1 mg by mouth daily., Disp: , Rfl:    gabapentin (NEURONTIN) 300 MG capsule, Take 300 mg by mouth 3 (three) times daily., Disp: , Rfl:    hydrOXYzine (ATARAX/VISTARIL) 25 MG tablet, TAKE 1 4 ORAL TABLETS UP TO TWICE DAILY AS NEEDED FOR SEVERE ANXIETY OR SLEEP, Disp: , Rfl:    hydrOXYzine (ATARAX/VISTARIL) 50 MG tablet, SMARTSIG:1 Tablet(s) By Mouth 1 to 3 Times Daily, Disp: , Rfl:    isosorbide mononitrate (IMDUR) 30 MG 24 hr tablet, Take 30 mg by mouth daily., Disp: ,  Rfl:    [name] tablet, Take 15 mg by mouth 2 (two) times daily., Disp: , Rfl:    Multiple Vitamins-Minerals (MULTIVIT/MULTIMINERAL ADULT PO), Take by mouth., Disp: , Rfl:    propranolol (INDERAL) 40 MG tablet, TAKE 1 TABLET TWICE DAILY TO PREVENT HEADACHES, Disp: , Rfl:    risperiDONE (RISPERDAL) 0.5 MG tablet, TAKE 1 TABLET BY MOUTH AT BEDTIME FOR 1 WEEK, THEN TAKE 2 TABLETS BY MOUTH AT BEDTIME  THEREAFTER, Disp: , Rfl:    SUMAtriptan (IMITREX) 50 MG tablet, Take by mouth., Disp: , Rfl:    tamsulosin (FLOMAX) 0.4 MG CAPS capsule, Take by mouth., Disp: , Rfl:    Tiotropium Bromide Monohydrate (SPIRIVA RESPIMAT) 2.5 MCG/ACT AERS, Inhale 2 puffs into the lungs daily., Disp: 30 each, Rfl: 11   triamcinolone cream (KENALOG) 0.1 %, APPLY TO AFFECTED AREA (BACK OF NECK) TWICE DAILY FOR UP TO 2 WEEKS FOR CONTACT DERMATITIS, Disp: , Rfl:    venlafaxine XR (EFFEXOR-XR) 150 MG 24 hr capsule, Take 300 mg by mouth every morning., Disp: , Rfl:    VRAYLAR capsule, Take 1.5 mg by mouth., Disp: , Rfl:    VYVANSE 40 MG capsule, Take 40 mg by mouth every morning., Disp: , Rfl:    amitriptyline (ELAVIL) 50 MG tablet, TAKE 1 TABLET BY MOUTH EVERYDAY AT BEDTIME (Patient not taking: Reported on 09/11/2022), Disp: 90 tablet, Rfl: 0   baclofen (LIORESAL) 10 MG tablet, Take 10 mg by mouth 3 (three) times daily as needed. (Patient not taking: Reported on 09/11/2022), Disp: , Rfl:    cyclobenzaprine (FLEXERIL) 5 MG tablet, Take 5 mg by mouth 3 (three) times daily. (Patient not taking: Reported on 09/11/2022), Disp: , Rfl:    dicyclomine (BENTYL) 20 MG tablet, TAKE 1 TABLET (20 MG TOTAL) BY MOUTH 4 (FOUR) TIMES DAILY - BEFORE MEALS AND AT BEDTIME., Disp: 120 tablet, Rfl: 0   ipratropium-albuterol (DUONEB) 0.5-2.5 (3) MG/3ML SOLN, 3 mL Q4H (RT).  (Patient not taking: Reported on 09/11/2022), Disp: , Rfl:    NITROGLYCERIN SL, Place 1 tablet under the tongue. (Patient not taking: Reported on 09/11/2022), Disp: , Rfl:    oxybutynin (DITROPAN-XL) 5 MG 24 hr tablet, TAKE 1 TABLET BY MOUTH EVERYDAY AT BEDTIME (Patient not taking: Reported on 09/11/2022), Disp: 30 tablet, Rfl: 0   PREMARIN 0.625 MG tablet, Take 0.625 mg by mouth daily. (Patient not taking: Reported on 09/11/2022), Disp: , Rfl:    REXULTI 0.5 MG TABS, TAKE 1 TABLET BY MOUTH AT BEDTIME FOR 1 WEEK THEN INCREASE TO 1 MG TABLET (Patient not taking: Reported on  09/11/2022), Disp: , Rfl:    Subjective:   PATIENT ID: Lauren Escobar GENDER: female DOB: 11/27/1965, MRN: [id number]  Chief Complaint  Patient presents with   pulmonary consult    Hx of COPD- SOB with exertion and dry cough    HPI  56 year old female with a self-reported history of bronchitis who presents to clinic for the evaluation of shortness of breath.  She feels that she has been having progressive shortness of breath initially with exertion and now at rest. She feels that she is now unable to walk short distances (from room to room, around the block) without feeling completely out of breath.  She has a daily dry cough that is nonproductive that at times (around every 3 months) becomes productive.  At that point, she calls her primary care provider and she is prescribed a course of antibiotics and steroids for the management of  presumed COPD exacerbation.  For her shortness of breath, she has been seen by cardiology and has had an evaluation and subsequently discharged from their care.  She also underwent a pulmonary function test that was performed in our hospital. She tells me that she is had multiple inhalers over the years and there is some confusion over her inhaler regimen.  She is currently on albuterol as needed as well as on daily Spiriva HandiHaler.  There is report of her being on Advair but the patient vehemently denies actually taking the Advair.  She denies any fevers, chills, hemoptysis, productive sputum, night sweats, weight loss, abdominal pain, chest pain, or chest tightness.  She has gained weight over the past couple of years (between 15 and 20 pounds) and this has coincided with worsening symptoms.  Her other providers include hematology at [facility] where she is managed for polycythemia vera.  She is also getting yearly CT scans for lung cancer screening at [facility].  Review of the record does note that the CT mentions emphysema but otherwise normal lung parenchyma.  She  used to work at [employer] but has not done so for many years and she is currently on disability.  She used to smoke (3 packs a day for at least 20 years) but quit a year ago after her friend came down with cancer.  Ancillary information including prior medications, full medical/surgical/family/social histories, and PFTs (when available) are listed below and have been reviewed.   Review of Systems  Constitutional:  Negative for chills, fever and weight loss (weight gain).  Respiratory:  Positive for cough, hemoptysis, sputum production, shortness of breath and wheezing.   Cardiovascular:  Negative for chest pain.     Objective:   Vitals:   09/11/22 1004  BP: 124/78  Pulse: 78  Temp: 97.9 F (36.6 C)  TempSrc: Temporal  SpO2: 97%  Weight: 166 lb (75.3 kg)  Height: '4\' 11"'$  (1.499 m)   97% on RA  BMI Readings from Last 3 Encounters:  09/11/22 33.53 kg/m  03/11/21 28.28 kg/m  07/04/20 27.27 kg/m   Wt Readings from Last 3 Encounters:  09/11/22 166 lb (75.3 kg)  03/11/21 140 lb (63.5 kg)  07/04/20 135 lb (61.2 kg)    Physical Exam Constitutional:      Appearance: Normal appearance.  HENT:     Head: Normocephalic.     Mouth/Throat:     Mouth: Mucous membranes are moist.  Eyes:     Extraocular Movements: Extraocular movements intact.     Pupils: Pupils are equal, round, and reactive to light.  Cardiovascular:     Rate and Rhythm: Normal rate and regular rhythm.     Pulses: Normal pulses.     Heart sounds: Normal heart sounds.  Pulmonary:     Effort: Pulmonary effort is normal.     Breath sounds: Normal breath sounds. No wheezing.  Abdominal:     General: There is distension.     Palpations: Abdomen is soft.  Neurological:     General: No focal deficit present.     Mental Status: She is alert and oriented to person, place, and time. Mental status is at baseline.       Ancillary Information    Past Medical History:  Diagnosis Date   Abdominal pain     Abnormal mammogram    Anxiety    Asthma    Bleeding disorder ([place])    Blood dyscrasia    polycythemia vera   Chronic kidney  disease    COPD (chronic obstructive pulmonary disease) (HCC)    Cough    Depression    Diabetes mellitus without complication (HCC)    Fatigue    Headache 2000   Horizontal nystagmus    age 75   Hyperglycemia    Hypertension    Low back pain    Major depressive disorder    Nasal lesion    Neck pain    Snoring    SOB (shortness of breath)    Tobacco use    UTI (urinary tract infection)    Von Willebrand's disease ([place])      Family History  Problem Relation Age of Onset   Cancer Father        prostate   Stroke Father    Diabetes Father    Cancer Mother        bladder   Breast cancer Maternal Grandmother      Past Surgical History:  Procedure Laterality Date   ABDOMINAL HYSTERECTOMY  2002   oophorectomy   ABDOMINAL HYSTERECTOMY     APPENDECTOMY     BLADDER SURGERY     BREAST BIOPSY Left    NEG   CARDIAC CATHETERIZATION     CARDIAC CATHETERIZATION N/A 07/17/2016   Procedure: Left Heart Cath and Coronary Angiography;  Surgeon: Corey Skains, MD;  Location: [place] [facility];  Service: Cardiovascular;  Laterality: N/A;   COLONOSCOPY WITH PROPOFOL N/A 07/27/2015   Procedure: COLONOSCOPY WITH PROPOFOL;  Surgeon: Manya Silvas, MD;  Location[facility] ENDOSCOPY;  Service: Endoscopy;  Laterality: N/A;   COLONOSCOPY WITH PROPOFOL N/A 05/31/2019   Procedure: COLONOSCOPY WITH PROPOFOL;  Surgeon: Lin Landsman, MD;  Location: [facility] ENDOSCOPY;  Service: Gastroenterology;  Laterality: N/A;   ESOPHAGOGASTRODUODENOSCOPY N/A 06/18/2017   Procedure: ESOPHAGOGASTRODUODENOSCOPY (EGD);  Surgeon: Lin Landsman, MD;  Location: [facility] ENDOSCOPY;  Service: Gastroenterology;  Laterality: N/A;   ESOPHAGOGASTRODUODENOSCOPY (EGD) WITH PROPOFOL N/A 05/31/2019   Procedure: ESOPHAGOGASTRODUODENOSCOPY (EGD) WITH PROPOFOL;  Surgeon: Lin Landsman, MD;   Location: [place];  Service: Gastroenterology;  Laterality: N/A;   REPLACEMENT TOTAL KNEE Left    SHOULDER SURGERY Left    x 2    Social History   Socioeconomic History   Marital status: Single    Spouse name: Not on file   Number of children: Not on file   Years of education: Not on file   Highest education level: Not on file  Occupational History    Comment: disabled  Tobacco Use   Smoking status: Former    Packs/day: 2.75    Years: 29.00    Total pack years: 79.75    Types: Cigarettes    Quit date: 10/2020    Years since quitting: 1.8   Smokeless tobacco: Never  Substance and Sexual Activity   Alcohol use: No    Alcohol/week: 0.0 standard drinks of alcohol   Drug use: No   Sexual activity: Not on file  Other Topics Concern   Not on file  Social History Narrative   single   Social Determinants of Health   Financial Resource Strain: Not on file  Food Insecurity: Not on file  Transportation Needs: Not on file  Physical Activity: Not on file  Stress: Not on file  Social Connections: Not on file  Intimate Partner Violence: Not on file     Allergies  Allergen Reactions   Aspirin    Pregabalin Other (See Comments)    Unsteady gate, causing falls  Tramadol Rash     CBC    Component Value Date/Time   WBC 8.1 03/11/2021 1758   RBC 3.86 (L) 03/11/2021 1758   HGB 12.3 03/11/2021 1758   HGB 11.5 (L) 02/22/2015 0851   HCT 35.7 (L) 03/11/2021 1758   HCT 35.9 02/22/2015 0851   PLT 381 03/11/2021 1758   PLT 454 (H) 02/22/2015 0851   MCV 92.5 03/11/2021 1758   MCV 63 (L) 02/22/2015 0851   MCH 31.9 03/11/2021 1758   MCHC 34.5 03/11/2021 1758   RDW 13.1 03/11/2021 1758   RDW 18.6 (H) 02/22/2015 0851   LYMPHSABS 2.1 03/11/2021 1758   LYMPHSABS 2.0 02/22/2015 0851   MONOABS 0.7 03/11/2021 1758   MONOABS 0.4 02/22/2015 0851   EOSABS 0.2 03/11/2021 1758   EOSABS 0.4 02/22/2015 0851   BASOSABS 0.1 03/11/2021 1758   BASOSABS 0.1 02/22/2015 0851     Pulmonary Functions Testing Results:    Latest Ref Rng & Units 07/25/2022    2:02 PM  PFT Results  FVC-Pre L 1.58   FVC-Predicted Pre % 56   FVC-Post L 1.83   FVC-Predicted Post % 64   Pre FEV1/FVC % % 86   Post FEV1/FCV % % 82   FEV1-Pre L 1.35   FEV1-Predicted Pre % 61   FEV1-Post L 1.50   DLCO uncorrected ml/min/mmHg 14.12   DLCO UNC% % 81   DLVA Predicted % 93   TLC L 4.81   TLC % Predicted % 111   RV % Predicted % 142     Outpatient Medications Prior to Visit  Medication Sig Dispense Refill   albuterol (VENTOLIN HFA) 108 (90 Base) MCG/ACT inhaler Inhale into the lungs.     aspirin EC 81 MG tablet Take by mouth.     atorvastatin (LIPITOR) 20 MG tablet TAKE 1 TABLET BY MOUTH AT BEDTIME FOR HIGH CHOLESTEROL     cetirizine (ZYRTEC) 10 MG tablet TAKE 1 TABLET BY MOUTH DAILY FOR ALLERGIES     diclofenac Sodium (VOLTAREN) 1 % GEL      esomeprazole (NEXIUM) 40 MG capsule Take 40 mg by mouth daily.      folic acid (FOLVITE) 1 MG tablet Take 1 mg by mouth daily.     gabapentin (NEURONTIN) 300 MG capsule Take 300 mg by mouth 3 (three) times daily.     hydrOXYzine (ATARAX/VISTARIL) 25 MG tablet TAKE 1 4 ORAL TABLETS UP TO TWICE DAILY AS NEEDED FOR SEVERE ANXIETY OR SLEEP     hydrOXYzine (ATARAX/VISTARIL) 50 MG tablet SMARTSIG:1 Tablet(s) By Mouth 1 to 3 Times Daily     isosorbide mononitrate (IMDUR) 30 MG 24 hr tablet Take 30 mg by mouth daily.     [name] tablet Take 15 mg by mouth 2 (two) times daily.     Multiple Vitamins-Minerals (MULTIVIT/MULTIMINERAL ADULT PO) Take by mouth.     propranolol (INDERAL) 40 MG tablet TAKE 1 TABLET TWICE DAILY TO PREVENT HEADACHES     risperiDONE (RISPERDAL) 0.5 MG tablet TAKE 1 TABLET BY MOUTH AT BEDTIME FOR 1 WEEK, THEN TAKE 2 TABLETS BY MOUTH AT BEDTIME THEREAFTER     SUMAtriptan (IMITREX) 50 MG tablet Take by mouth.     tamsulosin (FLOMAX) 0.4 MG CAPS capsule Take by mouth.     triamcinolone cream (KENALOG) 0.1 % APPLY TO AFFECTED  AREA (BACK OF NECK) TWICE DAILY FOR UP TO 2 WEEKS FOR CONTACT DERMATITIS     venlafaxine XR (EFFEXOR-XR) 150 MG 24 hr capsule  Take 300 mg by mouth every morning.     VRAYLAR capsule Take 1.5 mg by mouth.     VYVANSE 40 MG capsule Take 40 mg by mouth every morning.     Fluticasone-Salmeterol (ADVAIR DISKUS) 250-50 MCG/DOSE AEPB Inhale 1 puff into the lungs 2 (two) times daily.      SPIRIVA HANDIHALER 18 MCG inhalation capsule 1 capsule daily.     amitriptyline (ELAVIL) 50 MG tablet TAKE 1 TABLET BY MOUTH EVERYDAY AT BEDTIME (Patient not taking: Reported on 09/11/2022) 90 tablet 0   baclofen (LIORESAL) 10 MG tablet Take 10 mg by mouth 3 (three) times daily as needed. (Patient not taking: Reported on 09/11/2022)     cyclobenzaprine (FLEXERIL) 5 MG tablet Take 5 mg by mouth 3 (three) times daily. (Patient not taking: Reported on 09/11/2022)     dicyclomine (BENTYL) 20 MG tablet TAKE 1 TABLET (20 MG TOTAL) BY MOUTH 4 (FOUR) TIMES DAILY - BEFORE MEALS AND AT BEDTIME. 120 tablet 0   ipratropium-albuterol (DUONEB) 0.5-2.5 (3) MG/3ML SOLN 3 mL Q4H (RT).  (Patient not taking: Reported on 09/11/2022)     NITROGLYCERIN SL Place 1 tablet under the tongue. (Patient not taking: Reported on 09/11/2022)     oxybutynin (DITROPAN-XL) 5 MG 24 hr tablet TAKE 1 TABLET BY MOUTH EVERYDAY AT BEDTIME (Patient not taking: Reported on 09/11/2022) 30 tablet 0   PREMARIN 0.625 MG tablet Take 0.625 mg by mouth daily. (Patient not taking: Reported on 09/11/2022)     REXULTI 0.5 MG TABS TAKE 1 TABLET BY MOUTH AT BEDTIME FOR 1 WEEK THEN INCREASE TO 1 MG TABLET (Patient not taking: Reported on 09/11/2022)     buPROPion (WELLBUTRIN XL) 300 MG 24 hr tablet Take by mouth. (Patient not taking: Reported on 09/11/2022)     CHANTIX CONTINUING MONTH PAK 1 MG tablet Take 1 mg by mouth 2 (two) times daily.     No facility-administered medications prior to visit.

## 2022-09-11 NOTE — Patient Instructions (Addendum)
Today we changed your inhalers. You will stop the Advair and the Spiriva Handihaler.  We will start the following in the morning: Breo Ellipta one puff once daily Spiriva Respimat two puffs once daily Albuterol inhaler every 4 hours as needed  I also placed a referral to pulmonary rehabilitation to help with your symptoms. I will see you for follow up in 3 months.

## 2022-09-11 NOTE — Telephone Encounter (Signed)
Requested last 3 CT's from [facility] via powershare.

## 2022-09-12 ENCOUNTER — Ambulatory Visit
Admission: RE | Admit: 2022-09-12 | Discharge: 2022-09-12 | Disposition: A | Discharge status: Home / Self Care | Payer: Self-pay | Source: Ambulatory Visit | Attending: Student in an Organized Health Care Education/Training Program | Admitting: Student in an Organized Health Care Education/Training Program

## 2022-09-12 DIAGNOSIS — R0602 Shortness of breath: Secondary | ICD-10-CM

## 2022-09-12 NOTE — Telephone Encounter (Signed)
Spoke to Byram with [facility] and requested images to be uploaded to PACS.

## 2022-09-12 NOTE — Telephone Encounter (Signed)
Images have been uploaded.  Routing to Dr. Genia Harold to make aware.

## 2022-09-23 ENCOUNTER — Encounter: Payer: Medicare Other | Attending: Student in an Organized Health Care Education/Training Program | Admitting: *Deleted

## 2022-09-23 ENCOUNTER — Encounter: Payer: Self-pay | Admitting: *Deleted

## 2022-09-23 DIAGNOSIS — J432 Centrilobular emphysema: Secondary | ICD-10-CM

## 2022-09-23 DIAGNOSIS — R0602 Shortness of breath: Secondary | ICD-10-CM

## 2022-09-23 NOTE — Progress Notes (Signed)
Virtual orientation call completed today. shehas an appointment on Date: 09/30/2022  for EP eval and gym Orientation.  Documentation of diagnosis can be found in [facility] Date: 09/11/2022 .

## 2022-09-30 ENCOUNTER — Encounter: Payer: Medicare Other | Attending: Student in an Organized Health Care Education/Training Program | Admitting: *Deleted

## 2022-09-30 VITALS — Ht 62.1 in | Wt 169.0 lb

## 2022-09-30 DIAGNOSIS — R0602 Shortness of breath: Secondary | ICD-10-CM | POA: Diagnosis present

## 2022-09-30 DIAGNOSIS — J432 Centrilobular emphysema: Secondary | ICD-10-CM | POA: Insufficient documentation

## 2022-09-30 NOTE — Patient Instructions (Signed)
Patient Instructions  Patient Details  Name: Lauren Escobar MRN: [id number] Date of Birth: 1966/03/28 Referring Provider:  Armando Reichert, MD  Below are your personal goals for exercise, nutrition, and risk factors. Our goal is to help you stay on track towards obtaining and maintaining these goals. We will be discussing your progress on these goals with you throughout the program.  Initial Exercise Prescription:  Initial Exercise Prescription - 09/30/22 1100       Date of Initial Exercise RX and Referring Provider   Date 09/30/22    Referring Provider Armando Reichert MD      NuStep   Level 3    SPM 80    Minutes 15    METs 2      Arm Ergometer   Level 1    RPM 25    Minutes 15    METs 2      Biostep-RELP   Level 2    SPM 50    Minutes 15    METs 2      Track   Laps 34    Minutes 15    METs 2.85      Prescription Details   Frequency (times per week) 3    Duration Progress to 30 minutes of continuous aerobic without signs/symptoms of physical distress      Intensity   THRR 40-80% of Max Heartrate 115-147    Ratings of Perceived Exertion 11-13    Perceived Dyspnea 0-4      Progression   Progression Continue to progress workloads to maintain intensity without signs/symptoms of physical distress.      Resistance Training   Training Prescription Yes    Weight 3 lb    Reps 10-15             Exercise Goals: Frequency: Be able to perform aerobic exercise two to three times per week in program working toward 2-5 days per week of home exercise.  Intensity: Work with a perceived exertion of 11 (fairly light) - 15 (hard) while following your exercise prescription.  We will make changes to your prescription with you as you progress through the program.   Duration: Be able to do 30 to 45 minutes of continuous aerobic exercise in addition to a 5 minute warm-up and a 5 minute cool-down routine.   Nutrition Goals: Your personal nutrition goals will be  established when you do your nutrition analysis with the dietician.  The following are general nutrition guidelines to follow: Cholesterol < '200mg'$ /day Sodium < '1500mg'$ /day Fiber: Women over 50 yrs - 21 grams per day  Personal Goals:  Personal Goals and Risk Factors at Admission - 09/30/22 1107       Core Components/Risk Factors/Patient Goals on Admission    Weight Management Yes;Obesity;Weight Loss    Intervention Weight Management: Develop a combined nutrition and exercise program designed to reach desired caloric intake, while maintaining appropriate intake of nutrient and fiber, sodium and fats, and appropriate energy expenditure required for the weight goal.;Weight Management: Provide education and appropriate resources to help participant work on and attain dietary goals.;Weight Management/Obesity: Establish reasonable short term and long term weight goals.;Obesity: Provide education and appropriate resources to help participant work on and attain dietary goals.    Admit Weight 169 lb (76.7 kg)    Goal Weight: Short Term 165 lb (74.8 kg)    Goal Weight: Long Term 130 lb (59 kg)    Expected Outcomes Short Term: Continue to assess and modify interventions  until short term weight is achieved;Long Term: Adherence to nutrition and physical activity/exercise program aimed toward attainment of established weight goal;Weight Loss: Understanding of general recommendations for a balanced deficit meal plan, which promotes 1-2 lb weight loss per week and includes a negative energy balance of [phone] kcal/d;Understanding recommendations for meals to include 15-35% energy as protein, 25-35% energy from fat, 35-60% energy from carbohydrates, less than '200mg'$  of dietary cholesterol, 20-35 gm of total fiber daily;Understanding of distribution of calorie intake throughout the day with the consumption of 4-5 meals/snacks    Improve shortness of breath with ADL's Yes    Intervention Provide education,  individualized exercise plan and daily activity instruction to help decrease symptoms of SOB with activities of daily living.    Expected Outcomes Short Term: Improve cardiorespiratory fitness to achieve a reduction of symptoms when performing ADLs;Long Term: Be able to perform more ADLs without symptoms or delay the onset of symptoms    Increase knowledge of respiratory medications and ability to use respiratory devices properly  Yes    Intervention Provide education and demonstration as needed of appropriate use of medications, inhalers, and oxygen therapy.    Expected Outcomes Short Term: Achieves understanding of medications use. Understands that oxygen is a medication prescribed by physician. Demonstrates appropriate use of inhaler and oxygen therapy.;Long Term: Maintain appropriate use of medications, inhalers, and oxygen therapy.    Diabetes Yes    Intervention Provide education about signs/symptoms and action to take for hypo/hyperglycemia.;Provide education about proper nutrition, including hydration, and aerobic/resistive exercise prescription along with prescribed medications to achieve blood glucose in normal ranges: Fasting glucose 65-99 mg/dL    Expected Outcomes Short Term: Participant verbalizes understanding of the signs/symptoms and immediate care of hyper/hypoglycemia, proper foot care and importance of medication, aerobic/resistive exercise and nutrition plan for blood glucose control.;Long Term: Attainment of HbA1C < 7%.    Hypertension Yes    Intervention Provide education on lifestyle modifcations including regular physical activity/exercise, weight management, moderate sodium restriction and increased consumption of fresh fruit, vegetables, and low fat dairy, alcohol moderation, and smoking cessation.;Monitor prescription use compliance.    Expected Outcomes Short Term: Continued assessment and intervention until BP is < 140/80m HG in hypertensive participants. < 130/851mHG in  hypertensive participants with diabetes, heart failure or chronic kidney disease.;Long Term: Maintenance of blood pressure at goal levels.             Tobacco Use Initial Evaluation: Social History   Tobacco Use  Smoking Status Former   Packs/day: 2.75   Years: 29.00   Total pack years: 79.75   Types: Cigarettes   Quit date: 10/2020   Years since quitting: 1.9  Smokeless Tobacco Never    Exercise Goals and Review:  Exercise Goals     Row Name 09/30/22 1102             Exercise Goals   Increase Physical Activity Yes       Intervention Provide advice, education, support and counseling about physical activity/exercise needs.;Develop an individualized exercise prescription for aerobic and resistive training based on initial evaluation findings, risk stratification, comorbidities and participant's personal goals.       Expected Outcomes Long Term: Exercising regularly at least 3-5 days a week.;Long Term: Add in home exercise to make exercise part of routine and to increase amount of physical activity.;Short Term: Attend rehab on a regular basis to increase amount of physical activity.       Increase Strength and Stamina Yes  Intervention Provide advice, education, support and counseling about physical activity/exercise needs.;Develop an individualized exercise prescription for aerobic and resistive training based on initial evaluation findings, risk stratification, comorbidities and participant's personal goals.       Expected Outcomes Short Term: Increase workloads from initial exercise prescription for resistance, speed, and METs.;Short Term: Perform resistance training exercises routinely during rehab and add in resistance training at home;Long Term: Improve cardiorespiratory fitness, muscular endurance and strength as measured by increased METs and functional capacity (6MWT)       Able to understand and use rate of perceived exertion (RPE) scale Yes       Intervention  Provide education and explanation on how to use RPE scale       Expected Outcomes Short Term: Able to use RPE daily in rehab to express subjective intensity level;Long Term:  Able to use RPE to guide intensity level when exercising independently       Able to understand and use Dyspnea scale Yes       Intervention Provide education and explanation on how to use Dyspnea scale       Expected Outcomes Short Term: Able to use Dyspnea scale daily in rehab to express subjective sense of shortness of breath during exertion;Long Term: Able to use Dyspnea scale to guide intensity level when exercising independently       Knowledge and understanding of Target Heart Rate Range (THRR) Yes       Intervention Provide education and explanation of THRR including how the numbers were predicted and where they are located for reference       Expected Outcomes Short Term: Able to state/look up THRR;Short Term: Able to use daily as guideline for intensity in rehab;Long Term: Able to use THRR to govern intensity when exercising independently       Able to check pulse independently Yes       Intervention Provide education and demonstration on how to check pulse in carotid and radial arteries.;Review the importance of being able to check your own pulse for safety during independent exercise       Expected Outcomes Short Term: Able to explain why pulse checking is important during independent exercise;Long Term: Able to check pulse independently and accurately       Understanding of Exercise Prescription Yes       Intervention Provide education, explanation, and written materials on patient's individual exercise prescription       Expected Outcomes Short Term: Able to explain program exercise prescription;Long Term: Able to explain home exercise prescription to exercise independently                Copy of goals given to participant.

## 2022-09-30 NOTE — Progress Notes (Signed)
Pulmonary Individual Treatment Plan  Patient Details  Name: Lauren Escobar MRN: [id number] Date of Birth: 04-10-1966 Referring Provider:   Flowsheet Row Pulmonary Rehab from 09/30/2022 in [facility] Cardiac and Pulmonary Rehab  Referring Provider Armando Reichert MD       Initial Encounter Date:  Flowsheet Row Pulmonary Rehab from 09/30/2022 in [facility] Cardiac and Pulmonary Rehab  Date 09/30/22       Visit Diagnosis: Centrilobular emphysema ([place])  Shortness of breath  Patient's Home Medications on Admission:  Current Outpatient Medications:    albuterol (VENTOLIN HFA) 108 (90 Base) MCG/ACT inhaler, Inhale into the lungs., Disp: , Rfl:    amitriptyline (ELAVIL) 50 MG tablet, TAKE 1 TABLET BY MOUTH EVERYDAY AT BEDTIME (Patient not taking: Reported on 09/11/2022), Disp: 90 tablet, Rfl: 0   aspirin EC 81 MG tablet, Take by mouth., Disp: , Rfl:    atorvastatin (LIPITOR) 20 MG tablet, TAKE 1 TABLET BY MOUTH AT BEDTIME FOR HIGH CHOLESTEROL, Disp: , Rfl:    baclofen (LIORESAL) 10 MG tablet, Take 10 mg by mouth 3 (three) times daily as needed. (Patient not taking: Reported on 09/11/2022), Disp: , Rfl:    cetirizine (ZYRTEC) 10 MG tablet, TAKE 1 TABLET BY MOUTH DAILY FOR ALLERGIES, Disp: , Rfl:    cyclobenzaprine (FLEXERIL) 5 MG tablet, Take 5 mg by mouth 3 (three) times daily. (Patient not taking: Reported on 09/11/2022), Disp: , Rfl:    diclofenac Sodium (VOLTAREN) 1 % GEL, , Disp: , Rfl:    dicyclomine (BENTYL) 20 MG tablet, TAKE 1 TABLET (20 MG TOTAL) BY MOUTH 4 (FOUR) TIMES DAILY - BEFORE MEALS AND AT BEDTIME., Disp: 120 tablet, Rfl: 0   esomeprazole (NEXIUM) 40 MG capsule, Take 40 mg by mouth daily. , Disp: , Rfl:    ferrous sulfate 325 (65 FE) MG tablet, Take by mouth., Disp: , Rfl:    fluticasone furoate-vilanterol (BREO ELLIPTA) 100-25 MCG/ACT AEPB, Inhale 1 puff into the lungs daily., Disp: 30 each, Rfl: 11   folic acid (FOLVITE) 1 MG tablet, Take 1 mg by mouth daily., Disp: , Rfl:     gabapentin (NEURONTIN) 300 MG capsule, Take 300 mg by mouth 3 (three) times daily. (Patient not taking: Reported on 09/23/2022), Disp: , Rfl:    hydrOXYzine (ATARAX/VISTARIL) 25 MG tablet, TAKE 1 4 ORAL TABLETS UP TO TWICE DAILY AS NEEDED FOR SEVERE ANXIETY OR SLEEP (Patient not taking: Reported on 09/23/2022), Disp: , Rfl:    hydrOXYzine (ATARAX/VISTARIL) 50 MG tablet, SMARTSIG:1 Tablet(s) By Mouth 1 to 3 Times Daily (Patient not taking: Reported on 09/23/2022), Disp: , Rfl:    ipratropium-albuterol (DUONEB) 0.5-2.5 (3) MG/3ML SOLN, 3 mL Q4H (RT).  (Patient not taking: Reported on 09/11/2022), Disp: , Rfl:    isosorbide mononitrate (IMDUR) 30 MG 24 hr tablet, Take 30 mg by mouth daily., Disp: , Rfl:    [name] tablet, Take 15 mg by mouth 2 (two) times daily. (Patient not taking: Reported on 09/23/2022), Disp: , Rfl:    Multiple Vitamins-Minerals (MULTIVIT/MULTIMINERAL ADULT PO), Take by mouth., Disp: , Rfl:    NITROGLYCERIN SL, Place 1 tablet under the tongue. (Patient not taking: Reported on 09/11/2022), Disp: , Rfl:    oxybutynin (DITROPAN-XL) 5 MG 24 hr tablet, TAKE 1 TABLET BY MOUTH EVERYDAY AT BEDTIME (Patient not taking: Reported on 09/11/2022), Disp: 30 tablet, Rfl: 0   PREMARIN 0.625 MG tablet, Take 0.625 mg by mouth daily. (Patient not taking: Reported on 09/11/2022), Disp: , Rfl:    propranolol (  INDERAL) 40 MG tablet, TAKE 1 TABLET TWICE DAILY TO PREVENT HEADACHES, Disp: , Rfl:    REXULTI 0.5 MG TABS, TAKE 1 TABLET BY MOUTH AT BEDTIME FOR 1 WEEK THEN INCREASE TO 1 MG TABLET (Patient not taking: Reported on 09/11/2022), Disp: , Rfl:    risperiDONE (RISPERDAL) 0.5 MG tablet, TAKE 1 TABLET BY MOUTH AT BEDTIME FOR 1 WEEK, THEN TAKE 2 TABLETS BY MOUTH AT BEDTIME THEREAFTER (Patient not taking: Reported on 09/23/2022), Disp: , Rfl:    SUMAtriptan (IMITREX) 50 MG tablet, Take by mouth. (Patient not taking: Reported on 09/23/2022), Disp: , Rfl:    tamsulosin (FLOMAX) 0.4 MG CAPS capsule, Take  by mouth. (Patient not taking: Reported on 09/23/2022), Disp: , Rfl:    Tiotropium Bromide Monohydrate (SPIRIVA RESPIMAT) 2.5 MCG/ACT AERS, Inhale 2 puffs into the lungs daily. (Patient not taking: Reported on 09/23/2022), Disp: 30 each, Rfl: 11   triamcinolone cream (KENALOG) 0.1 %, APPLY TO AFFECTED AREA (BACK OF NECK) TWICE DAILY FOR UP TO 2 WEEKS FOR CONTACT DERMATITIS (Patient not taking: Reported on 09/23/2022), Disp: , Rfl:    venlafaxine XR (EFFEXOR-XR) 150 MG 24 hr capsule, Take 300 mg by mouth every morning. (Patient not taking: Reported on 09/23/2022), Disp: , Rfl:    VRAYLAR capsule, Take 1.5 mg by mouth. (Patient not taking: Reported on 09/23/2022), Disp: , Rfl:    VYVANSE 40 MG capsule, Take 40 mg by mouth every morning. (Patient not taking: Reported on 09/23/2022), Disp: , Rfl:   Past Medical History: Past Medical History:  Diagnosis Date   Abdominal pain    Abnormal mammogram    Anxiety    Asthma    Bleeding disorder ([place])    Blood dyscrasia    polycythemia vera   Chronic kidney disease    COPD (chronic obstructive pulmonary disease) (HCC)    Cough    Depression    Diabetes mellitus without complication (HCC)    Fatigue    Headache 2000   Horizontal nystagmus    age 56   Hyperglycemia    Hypertension    Low back pain    Major depressive disorder    Nasal lesion    Neck pain    Snoring    SOB (shortness of breath)    Tobacco use    UTI (urinary tract infection)    Von Willebrand's disease ([place])     Tobacco Use: Social History   Tobacco Use  Smoking Status Former   Packs/day: 2.75   Years: 29.00   Total pack years: 79.75   Types: Cigarettes   Quit date: 10/2020   Years since quitting: 1.9  Smokeless Tobacco Never    Labs: Review Flowsheet       Latest Ref Rng & Units 04/06/2013 01/01/2014 06/16/2017  Labs for ITP Cardiac and Pulmonary Rehab  Cholestrol 0 - 200 mg/dL 119  109  -  LDL (calc) 0 - 100 mg/dL 34  40  -  HDL-C 40 - 60 mg/dL 32  34  -   Trlycerides 0 - 200 mg/dL 267  175  -  Hemoglobin A1c 4.8 - 5.6 % 5.2  5.5  5.5      Pulmonary Assessment Scores:  Pulmonary Assessment Scores     Row Name 09/30/22 1109         ADL UCSD   ADL Phase Entry     SOB Score total 62     Rest 1     Walk 2  Stairs 5     Bath 4     Dress 2     Shop 3       CAT Score   CAT Score 20       mMRC Score   mMRC Score 3              UCSD: Self-administered rating of dyspnea associated with activities of daily living (ADLs) 6-point scale (0 = "not at all" to 5 = "maximal or unable to do because of breathlessness")  Scoring Scores range from 0 to 120.  Minimally important difference is 5 units  CAT: CAT can identify the health impairment of COPD patients and is better correlated with disease progression.  CAT has a scoring range of zero to 40. The CAT score is classified into four groups of low (less than 10), medium (10 - 20), high (21-30) and very high (31-40) based on the impact level of disease on health status. A CAT score over 10 suggests significant symptoms.  A worsening CAT score could be explained by an exacerbation, poor medication adherence, poor inhaler technique, or progression of COPD or comorbid conditions.  CAT MCID is 2 points  mMRC: mMRC (Modified Medical Research Council) Dyspnea Scale is used to assess the degree of baseline functional disability in patients of respiratory disease due to dyspnea. No minimal important difference is established. A decrease in score of 1 point or greater is considered a positive change.   Pulmonary Function Assessment:  Pulmonary Function Assessment - 09/30/22 1109       Breath   Shortness of Breath Yes;Fear of Shortness of Breath;Limiting activity;Panic with Shortness of Breath             Exercise Target Goals: Exercise Program Goal: Individual exercise prescription set using results from initial 6 min walk test and THRR while considering  patient's activity  barriers and safety.   Exercise Prescription Goal: Initial exercise prescription builds to 30-45 minutes a day of aerobic activity, 2-3 days per week.  Home exercise guidelines will be given to patient during program as part of exercise prescription that the participant will acknowledge.  Education: Aerobic Exercise: - Group verbal and visual presentation on the components of exercise prescription. Introduces F.I.T.T principle from ACSM for exercise prescriptions.  Reviews F.I.T.T. principles of aerobic exercise including progression. Written material given at graduation.   Education: Resistance Exercise: - Group verbal and visual presentation on the components of exercise prescription. Introduces F.I.T.T principle from ACSM for exercise prescriptions  Reviews F.I.T.T. principles of resistance exercise including progression. Written material given at graduation.    Education: Exercise & Equipment Safety: - Individual verbal instruction and demonstration of equipment use and safety with use of the equipment. Flowsheet Row Pulmonary Rehab from 09/30/2022 in [facility] Cardiac and Pulmonary Rehab  Date 09/30/22  Educator [facility]  Instruction Review Code 1- Verbalizes Understanding       Education: Exercise Physiology & General Exercise Guidelines: - Group verbal and written instruction with models to review the exercise physiology of the cardiovascular system and associated critical values. Provides general exercise guidelines with specific guidelines to those with heart or lung disease.    Education: Flexibility, Balance, Mind/Body Relaxation: - Group verbal and visual presentation with interactive activity on the components of exercise prescription. Introduces F.I.T.T principle from ACSM for exercise prescriptions. Reviews F.I.T.T. principles of flexibility and balance exercise training including progression. Also discusses the mind body connection.  Reviews various relaxation techniques to help  reduce and manage  stress (i.e. Deep breathing, progressive muscle relaxation, and visualization). Balance handout provided to take home. Written material given at graduation.   Activity Barriers & Risk Stratification:  Activity Barriers & Cardiac Risk Stratification - 09/30/22 1100       Activity Barriers & Cardiac Risk Stratification   Activity Barriers Left Knee Replacement;Shortness of Breath;Balance Concerns;History of Falls;Muscular Weakness;Deconditioning;Back Problems   occasional back pain            6 Minute Walk:  6 Minute Walk     Row Name 09/30/22 1058         6 Minute Walk   Phase Initial     Distance 1315 feet     Walk Time 6 minutes     # of Rest Breaks 0     MPH 2.49     METS 3.55     RPE 15     Perceived Dyspnea  3     VO2 Peak 12.42     Symptoms Yes (comment)     Comments SOB, Fatigue     Resting HR 83 bpm     Resting BP 124/66     Resting Oxygen Saturation  96 %     Exercise Oxygen Saturation  during 6 min walk 95 %     Max Ex. HR 106 bpm     Max Ex. BP 146/74     2 Minute Post BP 136/72       Interval HR   1 Minute HR 104     2 Minute HR 104     3 Minute HR 103     4 Minute HR 106     5 Minute HR 101     6 Minute HR 101     2 Minute Post HR 82     Interval Heart Rate? Yes       Interval Oxygen   Interval Oxygen? Yes     Baseline Oxygen Saturation % 96 %     1 Minute Oxygen Saturation % 95 %     1 Minute Liters of Oxygen 0 L  Room Air     2 Minute Oxygen Saturation % 96 %     2 Minute Liters of Oxygen 0 L     3 Minute Oxygen Saturation % 96 %     3 Minute Liters of Oxygen 0 L     4 Minute Oxygen Saturation % 96 %     4 Minute Liters of Oxygen 0 L     5 Minute Oxygen Saturation % 95 %     5 Minute Liters of Oxygen 0 L     6 Minute Oxygen Saturation % 96 %     6 Minute Liters of Oxygen 0 L     2 Minute Post Oxygen Saturation % 96 %     2 Minute Post Liters of Oxygen 0 L             Oxygen Initial Assessment:  Oxygen Initial  Assessment - 09/30/22 1109       Home Oxygen   Home Oxygen Device None    Sleep Oxygen Prescription None    Home Exercise Oxygen Prescription None    Home Resting Oxygen Prescription None      Initial 6 min Walk   Oxygen Used None      Program Oxygen Prescription   Program Oxygen Prescription None      Intervention   Short Term Goals To learn and  demonstrate proper pursed lip breathing techniques or other breathing techniques. ;To learn and understand importance of maintaining oxygen saturations>88%;To learn and demonstrate proper use of respiratory medications;To learn and understand importance of monitoring SPO2 with pulse oximeter and demonstrate accurate use of the pulse oximeter.    Long  Term Goals Maintenance of O2 saturations>88%;Exhibits proper breathing techniques, such as pursed lip breathing or other method taught during program session;Compliance with respiratory medication;Demonstrates proper use of MDI's;Verbalizes importance of monitoring SPO2 with pulse oximeter and return demonstration             Oxygen Re-Evaluation:   Oxygen Discharge (Final Oxygen Re-Evaluation):   Initial Exercise Prescription:  Initial Exercise Prescription - 09/30/22 1100       Date of Initial Exercise RX and Referring Provider   Date 09/30/22    Referring Provider Armando Reichert MD      NuStep   Level 3    SPM 80    Minutes 15    METs 2      Arm Ergometer   Level 1    RPM 25    Minutes 15    METs 2      Biostep-RELP   Level 2    SPM 50    Minutes 15    METs 2      Track   Laps 34    Minutes 15    METs 2.85      Prescription Details   Frequency (times per week) 3    Duration Progress to 30 minutes of continuous aerobic without signs/symptoms of physical distress      Intensity   THRR 40-80% of Max Heartrate 115-147    Ratings of Perceived Exertion 11-13    Perceived Dyspnea 0-4      Progression   Progression Continue to progress workloads to maintain  intensity without signs/symptoms of physical distress.      Resistance Training   Training Prescription Yes    Weight 3 lb    Reps 10-15             Perform Capillary Blood Glucose checks as needed.  Exercise Prescription Changes:   Exercise Prescription Changes     Row Name 09/30/22 1100             Response to Exercise   Blood Pressure (Admit) 124/66       Blood Pressure (Exercise) 146/74       Blood Pressure (Exit) 126/62       Heart Rate (Admit) 83 bpm       Heart Rate (Exercise) 106 bpm       Heart Rate (Exit) 77 bpm       Oxygen Saturation (Admit) 96 %       Oxygen Saturation (Exercise) 95 %       Oxygen Saturation (Exit) 98 %       Rating of Perceived Exertion (Exercise) 15       Perceived Dyspnea (Exercise) 3       Symptoms SOB, fatigue       Comments walk test results                Exercise Comments:   Exercise Goals and Review:   Exercise Goals     Row Name 09/30/22 1102             Exercise Goals   Increase Physical Activity Yes       Intervention Provide advice, education, support and counseling about physical  activity/exercise needs.;Develop an individualized exercise prescription for aerobic and resistive training based on initial evaluation findings, risk stratification, comorbidities and participant's personal goals.       Expected Outcomes Long Term: Exercising regularly at least 3-5 days a week.;Long Term: Add in home exercise to make exercise part of routine and to increase amount of physical activity.;Short Term: Attend rehab on a regular basis to increase amount of physical activity.       Increase Strength and Stamina Yes       Intervention Provide advice, education, support and counseling about physical activity/exercise needs.;Develop an individualized exercise prescription for aerobic and resistive training based on initial evaluation findings, risk stratification, comorbidities and participant's personal goals.       Expected  Outcomes Short Term: Increase workloads from initial exercise prescription for resistance, speed, and METs.;Short Term: Perform resistance training exercises routinely during rehab and add in resistance training at home;Long Term: Improve cardiorespiratory fitness, muscular endurance and strength as measured by increased METs and functional capacity (6MWT)       Able to understand and use rate of perceived exertion (RPE) scale Yes       Intervention Provide education and explanation on how to use RPE scale       Expected Outcomes Short Term: Able to use RPE daily in rehab to express subjective intensity level;Long Term:  Able to use RPE to guide intensity level when exercising independently       Able to understand and use Dyspnea scale Yes       Intervention Provide education and explanation on how to use Dyspnea scale       Expected Outcomes Short Term: Able to use Dyspnea scale daily in rehab to express subjective sense of shortness of breath during exertion;Long Term: Able to use Dyspnea scale to guide intensity level when exercising independently       Knowledge and understanding of Target Heart Rate Range (THRR) Yes       Intervention Provide education and explanation of THRR including how the numbers were predicted and where they are located for reference       Expected Outcomes Short Term: Able to state/look up THRR;Short Term: Able to use daily as guideline for intensity in rehab;Long Term: Able to use THRR to govern intensity when exercising independently       Able to check pulse independently Yes       Intervention Provide education and demonstration on how to check pulse in carotid and radial arteries.;Review the importance of being able to check your own pulse for safety during independent exercise       Expected Outcomes Short Term: Able to explain why pulse checking is important during independent exercise;Long Term: Able to check pulse independently and accurately       Understanding of  Exercise Prescription Yes       Intervention Provide education, explanation, and written materials on patient's individual exercise prescription       Expected Outcomes Short Term: Able to explain program exercise prescription;Long Term: Able to explain home exercise prescription to exercise independently                Exercise Goals Re-Evaluation :   Discharge Exercise Prescription (Final Exercise Prescription Changes):  Exercise Prescription Changes - 09/30/22 1100       Response to Exercise   Blood Pressure (Admit) 124/66    Blood Pressure (Exercise) 146/74    Blood Pressure (Exit) 126/62    Heart Rate (Admit) 83  bpm    Heart Rate (Exercise) 106 bpm    Heart Rate (Exit) 77 bpm    Oxygen Saturation (Admit) 96 %    Oxygen Saturation (Exercise) 95 %    Oxygen Saturation (Exit) 98 %    Rating of Perceived Exertion (Exercise) 15    Perceived Dyspnea (Exercise) 3    Symptoms SOB, fatigue    Comments walk test results             Nutrition:  Target Goals: Understanding of nutrition guidelines, daily intake of sodium '1500mg'$ , cholesterol '200mg'$ , calories 30% from fat and 7% or less from saturated fats, daily to have 5 or more servings of fruits and vegetables.  Education: All About Nutrition: -Group instruction provided by verbal, written material, interactive activities, discussions, models, and posters to present general guidelines for heart healthy nutrition including fat, fiber, MyPlate, the role of sodium in heart healthy nutrition, utilization of the nutrition label, and utilization of this knowledge for meal planning. Follow up email sent as well. Written material given at graduation.   Biometrics:  Pre Biometrics - 09/30/22 1104       Pre Biometrics   Height 5' 2.1" (1.577 m)    Weight 169 lb (76.7 kg)    Waist Circumference 38 inches    Hip Circumference 40 inches    Waist to Hip Ratio 0.95 %    BMI (Calculated) 30.82    Single Leg Stand 0.9 seconds               Nutrition Therapy Plan and Nutrition Goals:  Nutrition Therapy & Goals - 09/30/22 0950       Personal Nutrition Goals   Comments Coffee (french vanilla creamer) with some regular mountain dew. She eats about 1 meal per day tonight she will cook beef tips with gravy and rice.      Intervention Plan   Intervention Prescribe, educate and counsel regarding individualized specific dietary modifications aiming towards targeted core components such as weight, hypertension, lipid management, diabetes, heart failure and other comorbidities.;Nutrition handout(s) given to patient.    Expected Outcomes Short Term Goal: Understand basic principles of dietary content, such as calories, fat, sodium, cholesterol and nutrients.;Short Term Goal: A plan has been developed with personal nutrition goals set during dietitian appointment.;Long Term Goal: Adherence to prescribed nutrition plan.             Nutrition Assessments:  MEDIFICTS Score Key: ?70 Need to make dietary changes  40-70 Heart Healthy Diet ? 40 Therapeutic Level Cholesterol Diet  Flowsheet Row Pulmonary Rehab from 09/30/2022 in [facility] Cardiac and Pulmonary Rehab  Picture Your Plate Total Score on Admission 55      Picture Your Plate Scores: <58 Unhealthy dietary pattern with much room for improvement. 41-50 Dietary pattern unlikely to meet recommendations for good health and room for improvement. 51-60 More healthful dietary pattern, with some room for improvement.  >60 Healthy dietary pattern, although there may be some specific behaviors that could be improved.   Nutrition Goals Re-Evaluation:   Nutrition Goals Discharge (Final Nutrition Goals Re-Evaluation):   Psychosocial: Target Goals: Acknowledge presence or absence of significant depression and/or stress, maximize coping skills, provide positive support system. Participant is able to verbalize types and ability to use techniques and skills needed for reducing  stress and depression.   Education: Stress, Anxiety, and Depression - Group verbal and visual presentation to define topics covered.  Reviews how body is impacted by stress, anxiety, and depression.  Also  discusses healthy ways to reduce stress and to treat/manage anxiety and depression.  Written material given at graduation.   Education: Sleep Hygiene -Provides group verbal and written instruction about how sleep can affect your health.  Define sleep hygiene, discuss sleep cycles and impact of sleep habits. Review good sleep hygiene tips.    Initial Review & Psychosocial Screening:  Initial Psych Review & Screening - 09/23/22 1027       Initial Review   Current issues with None Identified      Family Dynamics   Good Support System? Yes   daughter     Barriers   Psychosocial barriers to participate in program There are no identifiable barriers or psychosocial needs.      Screening Interventions   Interventions Encouraged to exercise;To provide support and resources with identified psychosocial needs;Provide feedback about the scores to participant    Expected Outcomes Short Term goal: Utilizing psychosocial counselor, staff and physician to assist with identification of specific Stressors or current issues interfering with healing process. Setting desired goal for each stressor or current issue identified.;Long Term Goal: Stressors or current issues are controlled or eliminated.;Short Term goal: Identification and review with participant of any Quality of Life or Depression concerns found by scoring the questionnaire.;Long Term goal: The participant improves quality of Life and PHQ9 Scores as seen by post scores and/or verbalization of changes             Quality of Life Scores:  Scores of 19 and below usually indicate a poorer quality of life in these areas.  A difference of  2-3 points is a clinically meaningful difference.  A difference of 2-3 points in the total score of the  Quality of Life Index has been associated with significant improvement in overall quality of life, self-image, physical symptoms, and general health in studies assessing change in quality of life.  PHQ-9: Review Flowsheet       09/30/2022 04/16/2017  Depression screen PHQ 2/9  Decreased Interest 0 0  Down, Depressed, Hopeless 0 0  PHQ - 2 Score 0 0  Altered sleeping 0 -  Tired, decreased energy 3 -  Change in appetite 3 -  Feeling bad or failure about yourself  3 -  Trouble concentrating 3 -  Moving slowly or fidgety/restless 0 -  Suicidal thoughts 0 -  PHQ-9 Score 12 -  Difficult doing work/chores Extremely dIfficult -   Interpretation of Total Score  Total Score Depression Severity:  1-4 = Minimal depression, 5-9 = Mild depression, 10-14 = Moderate depression, 15-19 = Moderately severe depression, 20-27 = Severe depression   Psychosocial Evaluation and Intervention:  Psychosocial Evaluation - 09/23/22 1037       Psychosocial Evaluation & Interventions   Interventions Encouraged to exercise with the program and follow exercise prescription    Comments Almina has no barriers to attending the program. She hopes to see improvement in her shortness of breath symptoms with exertion. She lives alone. Her daughter is her support person.  She has been diagnosed with diabetes and is diet controlled at this moment. She has another A1C scheduled in Dec. She is ready to start the program and progress her exercise prescription, learn energy conservation and improve her stamina.    Expected Outcomes STG Diamantina will attend all scheduled sessions, she will meet with our LDN to review safe ways to work on weight loss and help with diabetes control. She will begin an exercise progression.  LTG Mykah will continue to  utilize the tips for exercise, breathing and risk factor control after discharge from the program.    Continue Psychosocial Services  Follow up required by staff              Psychosocial Re-Evaluation:   Psychosocial Discharge (Final Psychosocial Re-Evaluation):   Education: Education Goals: Education classes will be provided on a weekly basis, covering required topics. Participant will state understanding/return demonstration of topics presented.  Learning Barriers/Preferences:   General Pulmonary Education Topics:  Infection Prevention: - Provides verbal and written material to individual with discussion of infection control including proper hand washing and proper equipment cleaning during exercise session. Flowsheet Row Pulmonary Rehab from 09/30/2022 in [facility] Cardiac and Pulmonary Rehab  Date 09/30/22  Educator [facility]  Instruction Review Code 1- Verbalizes Understanding       Falls Prevention: - Provides verbal and written material to individual with discussion of falls prevention and safety. Flowsheet Row Pulmonary Rehab from 09/30/2022 in [facility] Cardiac and Pulmonary Rehab  Date 09/23/22  Educator SB  Instruction Review Code 1- Verbalizes Understanding       Chronic Lung Disease Review: - Group verbal instruction with posters, models, PowerPoint presentations and videos,  to review new updates, new respiratory medications, new advancements in procedures and treatments. Providing information on websites and "800" numbers for continued self-education. Includes information about supplement oxygen, available portable oxygen systems, continuous and intermittent flow rates, oxygen safety, concentrators, and Medicare reimbursement for oxygen. Explanation of Pulmonary Drugs, including class, frequency, complications, importance of spacers, rinsing mouth after steroid MDI's, and proper cleaning methods for nebulizers. Review of basic lung anatomy and physiology related to function, structure, and complications of lung disease. Review of risk factors. Discussion about methods for diagnosing sleep apnea and types of masks and machines for OSA. Includes a  review of the use of types of environmental controls: home humidity, furnaces, filters, dust mite/pet prevention, HEPA vacuums. Discussion about weather changes, air quality and the benefits of nasal washing. Instruction on Warning signs, infection symptoms, calling MD promptly, preventive modes, and value of vaccinations. Review of effective airway clearance, coughing and/or vibration techniques. Emphasizing that all should Create an Action Plan. Written material given at graduation. Flowsheet Row Pulmonary Rehab from 09/30/2022 in [facility] Cardiac and Pulmonary Rehab  Education need identified 09/30/22       AED/CPR: - Group verbal and written instruction with the use of models to demonstrate the basic use of the AED with the basic ABC's of resuscitation.    Anatomy and Cardiac Procedures: - Group verbal and visual presentation and models provide information about basic cardiac anatomy and function. Reviews the testing methods done to diagnose heart disease and the outcomes of the test results. Describes the treatment choices: Medical Management, Angioplasty, or Coronary Bypass Surgery for treating various heart conditions including Myocardial Infarction, Angina, Valve Disease, and Cardiac Arrhythmias.  Written material given at graduation.   Medication Safety: - Group verbal and visual instruction to review commonly prescribed medications for heart and lung disease. Reviews the medication, class of the drug, and side effects. Includes the steps to properly store meds and maintain the prescription regimen.  Written material given at graduation.   Other: -Provides group and verbal instruction on various topics (see comments)   Knowledge Questionnaire Score:  Knowledge Questionnaire Score - 09/30/22 1107       Knowledge Questionnaire Score   Pre Score 16/18              Core Components/Risk Factors/Patient Goals  at Admission:  Personal Goals and Risk Factors at Admission - 09/30/22  1107       Core Components/Risk Factors/Patient Goals on Admission    Weight Management Yes;Obesity;Weight Loss    Intervention Weight Management: Develop a combined nutrition and exercise program designed to reach desired caloric intake, while maintaining appropriate intake of nutrient and fiber, sodium and fats, and appropriate energy expenditure required for the weight goal.;Weight Management: Provide education and appropriate resources to help participant work on and attain dietary goals.;Weight Management/Obesity: Establish reasonable short term and long term weight goals.;Obesity: Provide education and appropriate resources to help participant work on and attain dietary goals.    Admit Weight 169 lb (76.7 kg)    Goal Weight: Short Term 165 lb (74.8 kg)    Goal Weight: Long Term 130 lb (59 kg)    Expected Outcomes Short Term: Continue to assess and modify interventions until short term weight is achieved;Long Term: Adherence to nutrition and physical activity/exercise program aimed toward attainment of established weight goal;Weight Loss: Understanding of general recommendations for a balanced deficit meal plan, which promotes 1-2 lb weight loss per week and includes a negative energy balance of [phone] kcal/d;Understanding recommendations for meals to include 15-35% energy as protein, 25-35% energy from fat, 35-60% energy from carbohydrates, less than '200mg'$  of dietary cholesterol, 20-35 gm of total fiber daily;Understanding of distribution of calorie intake throughout the day with the consumption of 4-5 meals/snacks    Improve shortness of breath with ADL's Yes    Intervention Provide education, individualized exercise plan and daily activity instruction to help decrease symptoms of SOB with activities of daily living.    Expected Outcomes Short Term: Improve cardiorespiratory fitness to achieve a reduction of symptoms when performing ADLs;Long Term: Be able to perform more ADLs without symptoms  or delay the onset of symptoms    Increase knowledge of respiratory medications and ability to use respiratory devices properly  Yes    Intervention Provide education and demonstration as needed of appropriate use of medications, inhalers, and oxygen therapy.    Expected Outcomes Short Term: Achieves understanding of medications use. Understands that oxygen is a medication prescribed by physician. Demonstrates appropriate use of inhaler and oxygen therapy.;Long Term: Maintain appropriate use of medications, inhalers, and oxygen therapy.    Diabetes Yes    Intervention Provide education about signs/symptoms and action to take for hypo/hyperglycemia.;Provide education about proper nutrition, including hydration, and aerobic/resistive exercise prescription along with prescribed medications to achieve blood glucose in normal ranges: Fasting glucose 65-99 mg/dL    Expected Outcomes Short Term: Participant verbalizes understanding of the signs/symptoms and immediate care of hyper/hypoglycemia, proper foot care and importance of medication, aerobic/resistive exercise and nutrition plan for blood glucose control.;Long Term: Attainment of HbA1C < 7%.    Hypertension Yes    Intervention Provide education on lifestyle modifcations including regular physical activity/exercise, weight management, moderate sodium restriction and increased consumption of fresh fruit, vegetables, and low fat dairy, alcohol moderation, and smoking cessation.;Monitor prescription use compliance.    Expected Outcomes Short Term: Continued assessment and intervention until BP is < 140/16m HG in hypertensive participants. < 130/832mHG in hypertensive participants with diabetes, heart failure or chronic kidney disease.;Long Term: Maintenance of blood pressure at goal levels.             Education:Diabetes - Individual verbal and written instruction to review signs/symptoms of diabetes, desired ranges of glucose level fasting, after  meals and with exercise. Acknowledge that pre and post  exercise glucose checks will be done for 3 sessions at entry of program. Flowsheet Row Pulmonary Rehab from 09/30/2022 in [facility] Cardiac and Pulmonary Rehab  Date 09/30/22  Educator [facility]  Instruction Review Code 1- Verbalizes Understanding       Know Your Numbers and Heart Failure: - Group verbal and visual instruction to discuss disease risk factors for cardiac and pulmonary disease and treatment options.  Reviews associated critical values for Overweight/Obesity, Hypertension, Cholesterol, and Diabetes.  Discusses basics of heart failure: signs/symptoms and treatments.  Introduces Heart Failure Zone chart for action plan for heart failure.  Written material given at graduation.   Core Components/Risk Factors/Patient Goals Review:    Core Components/Risk Factors/Patient Goals at Discharge (Final Review):    ITP Comments:  ITP Comments     Row Name 09/23/22 1048 09/30/22 1058         ITP Comments Virtual orientation call completed today. shehas an appointment on Date: 09/30/2022  for EP eval and gym Orientation.  [facility]: 09/11/2022 . Completed 6MWT and gym orientation. Initial ITP created and sent for review to Dr. Zetta Bills, Medical Director.               Comments: Initial ITP

## 2022-10-02 ENCOUNTER — Ambulatory Visit: Admit: 2022-10-02 | Discharge: 2022-10-03 | Payer: MEDICARE | Attending: Adult Health | Primary: Adult Health

## 2022-10-02 ENCOUNTER — Ambulatory Visit: Admit: 2022-10-02 | Discharge: 2022-10-03 | Payer: MEDICARE

## 2022-10-02 ENCOUNTER — Other Ambulatory Visit: Admit: 2022-10-02 | Discharge: 2022-10-03 | Payer: MEDICARE

## 2022-10-02 DIAGNOSIS — D45 Polycythemia vera: Principal | ICD-10-CM

## 2022-10-02 LAB — CBC W/ AUTO DIFF
BASOPHILS ABSOLUTE COUNT: 0.1 10*9/L (ref 0.0–0.1)
BASOPHILS RELATIVE PERCENT: 1.4 %
EOSINOPHILS ABSOLUTE COUNT: 0.1 10*9/L (ref 0.0–0.5)
EOSINOPHILS RELATIVE PERCENT: 2.2 %
HEMATOCRIT: 38.1 % (ref 34.0–44.0)
HEMOGLOBIN: 12.8 g/dL (ref 11.3–14.9)
LYMPHOCYTES ABSOLUTE COUNT: 2 10*9/L (ref 1.1–3.6)
LYMPHOCYTES RELATIVE PERCENT: 30 %
MEAN CORPUSCULAR HEMOGLOBIN CONC: 33.5 g/dL (ref 32.0–36.0)
MEAN CORPUSCULAR HEMOGLOBIN: 27.9 pg (ref 25.9–32.4)
MEAN CORPUSCULAR VOLUME: 83.3 fL (ref 77.6–95.7)
MEAN PLATELET VOLUME: 8.9 fL (ref 6.8–10.7)
MONOCYTES ABSOLUTE COUNT: 0.6 10*9/L (ref 0.3–0.8)
MONOCYTES RELATIVE PERCENT: 9.5 %
NEUTROPHILS ABSOLUTE COUNT: 3.8 10*9/L (ref 1.8–7.8)
NEUTROPHILS RELATIVE PERCENT: 56.9 %
PLATELET COUNT: 394 10*9/L (ref 150–450)
RED BLOOD CELL COUNT: 4.57 10*12/L (ref 3.95–5.13)
RED CELL DISTRIBUTION WIDTH: 14.6 % (ref 12.2–15.2)
WBC ADJUSTED: 6.6 10*9/L (ref 3.6–11.2)

## 2022-10-02 LAB — COMPREHENSIVE METABOLIC PANEL
ALBUMIN: 3.9 g/dL (ref 3.4–5.0)
ALKALINE PHOSPHATASE: 89 U/L (ref 46–116)
ALT (SGPT): 59 U/L — ABNORMAL HIGH (ref 10–49)
ANION GAP: 5 mmol/L (ref 5–14)
AST (SGOT): 86 U/L — ABNORMAL HIGH (ref <=34)
BILIRUBIN TOTAL: 0.5 mg/dL (ref 0.3–1.2)
BLOOD UREA NITROGEN: 11 mg/dL (ref 9–23)
BUN / CREAT RATIO: 17
CALCIUM: 8.7 mg/dL (ref 8.7–10.4)
CHLORIDE: 109 mmol/L — ABNORMAL HIGH (ref 98–107)
CO2: 26 mmol/L (ref 20.0–31.0)
CREATININE: 0.63 mg/dL
EGFR CKD-EPI (2021) FEMALE: >90 mL/min/{1.73_m2} (ref >=60)
GLUCOSE RANDOM: 93 mg/dL (ref 70–179)
POTASSIUM: 4.6 mmol/L (ref 3.4–4.8)
PROTEIN TOTAL: 6.8 g/dL (ref 5.7–8.2)
SODIUM: 140 mmol/L (ref 135–145)

## 2022-10-02 LAB — FERRITIN: FERRITIN: 79.2 ng/mL

## 2022-10-02 LAB — LACTATE DEHYDROGENASE: LACTATE DEHYDROGENASE: 182 U/L (ref 120–246)

## 2022-10-02 MED ORDER — MIRTAZAPINE 15 MG TABLET
ORAL_TABLET | Freq: Every evening | ORAL | 11 refills | 30 days | Status: CP
Start: 2022-10-02 — End: 2023-09-27

## 2022-10-02 NOTE — Unmapped (Signed)
ID: Courtney Erickson is a 56 y.o. 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 This treatment controlled her counts, but did not relieve many of the associated MPN symptoms.  For this reason, she was started on ruxolitinib.  At 15 mg BID, she continues to have good control of her counts. Initially, I felt that she was also getting symptomatic improvement from her ruxolitinib, but that is less clear now.  To complicate matters, many of her symptoms do not appear to be related to her MPN.      She had a spleen US shortly after her last visit.  Fortunately, this showed a normal spleen size, but did note likely fatty liver.  This likely accounts for her increased transaminases.  Her iron stores are replete and her counts are well controlled.    She has a urogynecology appointment coming up the end of next month.  She also needs to establish with [facility].   She reports she did not receive a notification regarding the previously scheduled appointment (was marked No Show).  I have refilled her remeron, but have requested she establish with a psych provider to manage her medications, since she was on several previously.      PLAN:  1) Continue ruxolitinib 15 mg BID  2) Continue ferrous Sulfate 325 mg three times per week through March 2024  3) Continue ASA 81mg  daily for now  4) New referral placed to [facility]  5) Reviewed need for Urogyn appointment  6) Follow up in 3 months    Markus Jarvis, RN, MSN, AGPCNP-C  Nurse Practitioner  Hematologic Malignancies  [facility]  10/02/2022    I personally spent 45 minutes face-to-face and non-face-to-face in the care of this patient, which includes all pre, intra, and post visit time on the date of service.  All documented time was specific to the E/M visit and does not include any procedures that may have been performed.      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%    09/2015: Transitioned to [facility]; held when platelets normalized  05/2019: Normal colonoscopy; Grade I reflux  09/2019: CBC: 15.8/14.0/596; Restarted Hydrea 500 mg every day   10/2019: 12.7/13.2/591; Increased Hydrea to 1000 mg every day   05/04/20: 5.5/12.8/218; CD34: 0.04  08/07/20:   Variants of Known/Likely Clinical Significance:   Gene Coding Predicted Protein Variant allele fraction   JAK2 c.1849G>T p.(Val617Phe) 21.9 %   PTPN11 c.178G>A p.(Gly60Ser) 48.5 %     11/09/20: 5.6/13.2/279;   11/24/20: Began Ruxolitinib 10 mg BID; DC'd hydrea  12/14/20: Inc ruxolitinib 15 mg BID  03/22/21: 6.5/12.2/344  11/26/21: 8.6/11.3/395 ANC 5.6  01/24/22: 13.2/9.6/591; ferritin: 5.3  07/02/22: 12,2/13.0/38.6/419;   Ferritin: 23.3  AST: 68; ALT: 49  LDL: 50; TG 317  10/02/22:  6.6/12.8/38.1/394k; ferritin 79.2    INTERVAL HX  Courtney Requejo comes for FU of her PCV.  We talked about the following:     Shoulder Pain:  She was seen by PM&R and this feels better.    Mental Health: She is currently on no medications.    No missed doses of Jakafi  Appetite is pretty poor; does not want to eat any more    MPN 10 Score  (App: [email or web address])   [email or web address]  (Useful for monitoring patient symptoms.    Symptom Range 7/21 1/22 2/17  5/26 10/31 04/08/22 06/2022 09/2022   Fatigue in the past 24 hours (Absent) 0 1 2 3 4 5 6 7 8 9  10 (Worst Imaginable) 10 10 10+ 10 10 10  8  Wants to just lie there - intertia  8   Filling up quickly when you eat (Early satiety)  (Absent) 0 1 2 3 4 5 6 7 8 9  10 (Worst Imaginable) 0 0 0 0 0 5 Feels full all the time - 3 bites 9   No weight loss - no reflux =-eat and get sick  No change in stools   10   Abdominal discomfort  (Absent) 0 1 2 3 4 5 6 7 8 9  10 (Worst Imaginable) 0 0 0 0 0 0 0 5   Inactivity (Absent) 0 1 2 3 4 5 6 7 8 9  10 (Worst Imaginable) 8 8 8 10 5 5 10 10    Problems with concentration - (Absent) 0 1 2 3 4 5 6 7 8 9  10 (Worst Imaginable) 5 8 5 10  0 10 0 5   Numbness/ Tingling (in my hands and feet) (Absent) 0 1 2 3 4 5 6 7 8 9  10 (Worst Imaginable) 3 3 3 3 7  0 0 0   Night sweats (Absent) 0 1 2 3 4 5 6 7 8 9  10 (Worst Imaginable) 8 8 0 3 8 10  Breaks out in sweat - 8 10   Itching (pruritus) (Absent) 0 1 2 3 4 5 6 7 8 9  10 (Worst Imaginable) 4  Night itch  aquagenic pruritus;  4 0  Sleep is better  3 0 0 10 - this symptom is back  On remeron - 6 years age   0   Bone pain (diffuse not joint pain or arthritis) (Absent) 0 1 2 3 4 5 6 7 8 9  10 (Worst Imaginable) 3 Severe but infreq  2   0 0 0 5 0 0   Fever (>100 F) (Absent) 0 1 2 3 4 5 6 7 8 9  10 (Daily) 0 0 0 0 0 0 0 0   Unintentional weight loss last 6 months (Absent) 0 1 2 3 4 5 6 7 8 9  10 (Worst Imaginable) 0 0 0 0 0 0 0 0   Total  41 43 26 42 30 45 45 38     PHYSICAL EXAM:  VS: As recorded in Epic  GENERAL: She appears reasonably well   HEENT: OP is clear  LYMPH NODES: No LAN  LUNGS: CTA  COR:RRR  ABD: NTND;   EXT: No edema    PMHx:    VWD: Responsive to DDAVP (2005)  H/O bleeding with childbirth, dental extractions, menstrual bleeding  VWF measured as low as 20% and as high as 84%  Responds to DDAVP  Txed with Humate P75 U/kg BID for 5 days  Lung cancer screening   12/19/17: Pulmonary nodules: Stable,  Lung-RADS Category 2.  01/18/19: LD CT Chest: Few unchanged subcentimeter pulmonary nodules. No new nodule.   Lung-RADS: 2  Severe cervical spondylosis (9/20)  Colonoscopy (2016): single polyp  Bladder disease: atony, chronic interstitial cystitis, with implanted bladder stimulator  S/P hysterectomy for endometriosis  Psychiatric disease, stated history of major depression; h/o Abilify, Wellbutrin, BuSpar, Adderall, Valium; on Latuda, gabapentin, Topamax, Viibryd, Abilify (txed by [facility]  GERD, gastric ulcer, epigastric pain, with negative EGD and EUS for pancreatic disease (8/20)  Mammogram (01/2017): BI-RADS 2, 1 year follow-up recommended/scheduled  Nl Cardiac Cath (9/17)   COPD;  smoker (30 pk yr), on Advair and DuoNeb;   PFTs (12/15)  FVC was 2.27 liters, 79% of predicted  FEV1 was 1.86, 78% of predicted  FEV1 ratio was 82  FEF 25-75% liters per second was 88% of predicted    LUNG VOLUMES:  TLC was 71% of predicted  RV was 55% of predicted    DIFFUSION CAPACITY:  DLCO was 63% of predicted  DLCO/VA was 123% of predicted    FLOW VOLUME LOOP:  ? Expiratory flow volume is somewhat flattened

## 2022-10-07 ENCOUNTER — Encounter: Payer: Medicare Other | Admitting: *Deleted

## 2022-10-07 DIAGNOSIS — J432 Centrilobular emphysema: Secondary | ICD-10-CM

## 2022-10-07 DIAGNOSIS — R0602 Shortness of breath: Secondary | ICD-10-CM

## 2022-10-07 LAB — GLUCOSE, CAPILLARY
Glucose-Capillary: 102 mg/dL — ABNORMAL HIGH (ref 70–99)
Glucose-Capillary: 104 mg/dL — ABNORMAL HIGH (ref 70–99)

## 2022-10-07 NOTE — Progress Notes (Signed)
Daily Session Note  Patient Details  Name: Lauren Escobar MRN: [id number] Date of Birth: 12-Jul-1966 Referring Provider:   Flowsheet Row Pulmonary Rehab from 09/30/2022 in [facility] Cardiac and Pulmonary Rehab  Referring Provider Armando Reichert MD       Encounter Date: 10/07/2022  Check In:  Session Check In - 10/07/22 0954       Check-In   Staff Present Darlyne Russian, RN, Doyce Para, BS, ACSM CEP, Exercise Physiologist;Noah Tickle, BS, Exercise Physiologist    Virtual Visit No    Medication changes reported     No    Fall or balance concerns reported    No    Warm-up and Cool-down Performed on first and last piece of equipment    Resistance Training Performed Yes    VAD Patient? No    PAD/SET Patient? No      Pain Assessment   Currently in Pain? No/denies                Social History   Tobacco Use  Smoking Status Former   Packs/day: 2.75   Years: 29.00   Total pack years: 79.75   Types: Cigarettes   Quit date: 10/2020   Years since quitting: 1.9  Smokeless Tobacco Never    Goals Met:  Independence with exercise equipment Exercise tolerated well No report of concerns or symptoms today Strength training completed today  Goals Unmet:  Not Applicable  Comments: First full day of exercise!  Patient was oriented to gym and equipment including functions, settings, policies, and procedures.  Patient's individual exercise prescription and treatment plan were reviewed.  All starting workloads were established based on the results of the 6 minute walk test done at initial orientation visit.  The plan for exercise progression was also introduced and progression will be customized based on patient's performance and goals.    Dr. Emily Filbert is Medical Director for [place].  Dr. Ottie Glazier is Medical Director for [facility] Pulmonary Rehabilitation.

## 2022-10-07 NOTE — Unmapped (Signed)
The [facility] has made a third and final attempt to reach this patient to refill the following medication:Jakafi.      We have left voicemails on the following phone numbers: [phone], have been unable to leave messages on the following phone numbers: [phone], have sent a text message to the following phone numbers: [phone], and have sent a Mychart questionnaire..    Dates contacted: 11/30,12/7,11  Last scheduled delivery: 09/06/22    The patient may be at risk of non-compliance with this medication. The patient should call the [facility] at [phone]  Option 4, then Option 1 (oncology) to refill medication.    Courtney Erickson Courtney Erickson   [facility] Specialty Technician

## 2022-10-08 NOTE — Unmapped (Signed)
[facility] Refill Coordination Note    Specialty Medication(s) to be Shipped:   Hematology/Oncology: [name]    Other medication(s) to be shipped: No additional medications requested for fill at this time     Courtney Erickson, DOB: 02-03-1966  Phone: [phone] (home)       All above HIPAA information was verified with patient.     Was a Nurse, learning disability used for this call? No    Completed refill call assessment today to schedule patient's medication shipment from the [facility] [phone]).  All relevant notes have been reviewed.     Specialty medication(s) and dose(s) confirmed: Regimen is correct and unchanged.   Changes to medications: Courtney Erickson reports no changes at this time.  Changes to insurance: No  New side effects reported not previously addressed with a pharmacist or physician: None reported  Questions for the pharmacist: No    Confirmed patient received a Conservation officer, historic buildings and a Surveyor, mining with first shipment. The patient will receive a drug information handout for each medication shipped and additional FDA Medication Guides as required.       DISEASE/MEDICATION-SPECIFIC INFORMATION        N/A    SPECIALTY MEDICATION ADHERENCE     Medication Adherence    Patient reported X missed doses in the last month: 0  Specialty Medication: [name]  Patient is on additional specialty medications: No  Informant: patient                       Were doses missed due to medication being on hold? No    Jakafi 15mg  : 10 days of medicine on hand       REFERRAL TO PHARMACIST     Referral to the pharmacist: Not needed      [facility]     Shipping address confirmed in Epic.     Delivery Scheduled: Yes, Expected medication delivery date: 10/16/22.     Medication will be delivered via Next Day Courier to the prescription address in Epic [place].    Courtney Erickson   [facility] Specialty Technician

## 2022-10-09 ENCOUNTER — Encounter: Payer: Medicare Other | Admitting: *Deleted

## 2022-10-09 DIAGNOSIS — J432 Centrilobular emphysema: Secondary | ICD-10-CM

## 2022-10-09 DIAGNOSIS — R0602 Shortness of breath: Secondary | ICD-10-CM

## 2022-10-09 LAB — GLUCOSE, CAPILLARY
Glucose-Capillary: 114 mg/dL — ABNORMAL HIGH (ref 70–99)
Glucose-Capillary: 111 mg/dL — ABNORMAL HIGH (ref 70–99)

## 2022-10-09 NOTE — Progress Notes (Signed)
Daily Session Note  Patient Details  Name: Lauren Escobar MRN: [id number] Date of Birth: 09-08-1966 Referring Provider:   Flowsheet Row Pulmonary Rehab from 09/30/2022 in [facility] Cardiac and Pulmonary Rehab  Referring Provider Armando Reichert MD       Encounter Date: 10/09/2022  Check In:  Session Check In - 10/09/22 0931       Check-In   Supervising physician immediately available to respond to emergencies See telemetry face sheet for immediately available ER MD    Location [facility]-Cardiac & Pulmonary Rehab    Staff Present Darlyne Russian, RN, Dimple Nanas, BS, Exercise Physiologist;Joseph Tessie Fass, [place]    Virtual Visit No    Medication changes reported     No    Fall or balance concerns reported    No    Warm-up and Cool-down Performed on first and last piece of equipment    Resistance Training Performed Yes    VAD Patient? No    PAD/SET Patient? No      Pain Assessment   Currently in Pain? No/denies                Social History   Tobacco Use  Smoking Status Former   Packs/day: 2.75   Years: 29.00   Total pack years: 79.75   Types: Cigarettes   Quit date: 10/2020   Years since quitting: 1.9  Smokeless Tobacco Never    Goals Met:  Independence with exercise equipment Exercise tolerated well No report of concerns or symptoms today Strength training completed today  Goals Unmet:  Not Applicable  Comments: Pt able to follow exercise prescription today without complaint.  Will continue to monitor for progression.    Dr. Emily Filbert is Medical Director for [place].  Dr. Ottie Glazier is Medical Director for [facility] Pulmonary Rehabilitation.

## 2022-10-11 ENCOUNTER — Encounter: Payer: Medicare Other | Admitting: *Deleted

## 2022-10-11 DIAGNOSIS — J432 Centrilobular emphysema: Secondary | ICD-10-CM

## 2022-10-11 DIAGNOSIS — R0602 Shortness of breath: Secondary | ICD-10-CM

## 2022-10-11 NOTE — Progress Notes (Signed)
Daily Session Note  Patient Details  Name: Lauren Escobar MRN: [id number] Date of Birth: 08-28-1966 Referring Provider:   Flowsheet Row Pulmonary Rehab from 09/30/2022 in [facility] Cardiac and Pulmonary Rehab  Referring Provider Armando Reichert MD       Encounter Date: 10/11/2022  Check In:  Session Check In - 10/11/22 1018       Check-In   Supervising physician immediately available to respond to emergencies See telemetry face sheet for immediately available ER MD    Location [facility]-Cardiac & Pulmonary Rehab    Staff Present Heath Lark, RN, BSN, CCRP;Jessica Henderson, MA, RCEP, CCRP, CCET;Joseph [place], [place]    Virtual Visit No    Medication changes reported     No    Fall or balance concerns reported    No    Warm-up and Cool-down Performed on first and last piece of equipment    Resistance Training Performed Yes    VAD Patient? No    PAD/SET Patient? No      Pain Assessment   Currently in Pain? No/denies                Social History   Tobacco Use  Smoking Status Former   Packs/day: 2.75   Years: 29.00   Total pack years: 79.75   Types: Cigarettes   Quit date: 10/2020   Years since quitting: 1.9  Smokeless Tobacco Never    Goals Met:  Proper associated with RPD/PD & O2 Sat Independence with exercise equipment Exercise tolerated well No report of concerns or symptoms today  Goals Unmet:  Not Applicable  Comments: Pt able to follow exercise prescription today without complaint.  Will continue to monitor for progression.    Dr. Emily Filbert is Medical Director for [place].  Dr. Ottie Glazier is Medical Director for [facility] Pulmonary Rehabilitation.

## 2022-10-14 ENCOUNTER — Encounter: Payer: Medicare Other | Admitting: *Deleted

## 2022-10-14 DIAGNOSIS — J432 Centrilobular emphysema: Secondary | ICD-10-CM

## 2022-10-14 DIAGNOSIS — J101 Influenza due to other identified influenza virus with other respiratory manifestations: Secondary | ICD-10-CM | POA: Diagnosis not present

## 2022-10-14 DIAGNOSIS — R0602 Shortness of breath: Secondary | ICD-10-CM

## 2022-10-14 NOTE — Progress Notes (Signed)
Daily Session Note  Patient Details  Name: Lauren Escobar MRN: [id number] Date of Birth: [birth year] Referring Provider:   Flowsheet Row Pulmonary Rehab from 09/30/2022 in [facility] Cardiac and Pulmonary Rehab  Referring Provider Armando Reichert MD       Encounter Date: 10/14/2022  Check In:  Session Check In - 10/14/22 0931       Check-In   Supervising physician immediately available to respond to emergencies See telemetry face sheet for immediately available ER MD    Location [facility]-Cardiac & Pulmonary Rehab    Staff Present Darlyne Russian, RN, Doyce Para, BS, ACSM CEP, Exercise Physiologist;Joseph Tessie F[place], [place]    Virtual Visit No    Medication changes reported     No    Fall or balance concerns reported    No    Warm-up and Cool-down Performed on first and last piece of equipment    Resistance Training Performed Yes    VAD Patient? No    PAD/SET Patient? No      Pain Assessment   Currently in Pain? No/denies                Social History   Tobacco Use  Smoking Status Former   Packs/day: 2.75   Years: 29.00   Total pack years: 79.75   Types: Cigarettes   Quit date: 10/2020   Years since quitting: 1.9  Smokeless Tobacco Never    Goals Met:  Independence with exercise equipment Exercise tolerated well No report of concerns or symptoms today Strength training completed today  Goals Unmet:  Not Applicable  Comments: Pt able to follow exercise prescription today without complaint.  Will continue to monitor for progression.    Dr. Emily Filbert is Medical Director for [place].  Dr. Ottie Glazier is Medical Director for [facility] Pulmonary Rehabilitation.

## 2022-10-15 ENCOUNTER — Other Ambulatory Visit: Payer: Self-pay

## 2022-10-15 ENCOUNTER — Emergency Department: Payer: Medicare Other

## 2022-10-15 ENCOUNTER — Inpatient Hospital Stay
Admission: EM | Admit: 2022-10-15 | Discharge: 2022-10-17 | DRG: 193 | Disposition: A | Discharge status: Home / Self Care | Payer: Medicare Other | Attending: Internal Medicine | Admitting: Internal Medicine

## 2022-10-15 ENCOUNTER — Observation Stay: Payer: Medicare Other

## 2022-10-15 ENCOUNTER — Encounter: Payer: Self-pay | Admitting: Emergency Medicine

## 2022-10-15 DIAGNOSIS — Z1152 Encounter for screening for COVID-19: Secondary | ICD-10-CM

## 2022-10-15 DIAGNOSIS — F418 Other specified anxiety disorders: Secondary | ICD-10-CM | POA: Diagnosis not present

## 2022-10-15 DIAGNOSIS — Z823 Family history of stroke: Secondary | ICD-10-CM

## 2022-10-15 DIAGNOSIS — E785 Hyperlipidemia, unspecified: Secondary | ICD-10-CM | POA: Diagnosis present

## 2022-10-15 DIAGNOSIS — I10 Essential (primary) hypertension: Secondary | ICD-10-CM | POA: Diagnosis not present

## 2022-10-15 DIAGNOSIS — J101 Influenza due to other identified influenza virus with other respiratory manifestations: Principal | ICD-10-CM | POA: Diagnosis present

## 2022-10-15 DIAGNOSIS — J439 Emphysema, unspecified: Secondary | ICD-10-CM | POA: Diagnosis present

## 2022-10-15 DIAGNOSIS — E1122 Type 2 diabetes mellitus with diabetic chronic kidney disease: Secondary | ICD-10-CM | POA: Diagnosis present

## 2022-10-15 DIAGNOSIS — Z90710 Acquired absence of both cervix and uterus: Secondary | ICD-10-CM | POA: Diagnosis not present

## 2022-10-15 DIAGNOSIS — Z96652 Presence of left artificial knee joint: Secondary | ICD-10-CM | POA: Diagnosis present

## 2022-10-15 DIAGNOSIS — Z87891 Personal history of nicotine dependence: Secondary | ICD-10-CM

## 2022-10-15 DIAGNOSIS — Z720 Tobacco use: Secondary | ICD-10-CM | POA: Diagnosis present

## 2022-10-15 DIAGNOSIS — I2510 Atherosclerotic heart disease of native coronary artery without angina pectoris: Secondary | ICD-10-CM | POA: Diagnosis present

## 2022-10-15 DIAGNOSIS — J441 Chronic obstructive pulmonary disease with (acute) exacerbation: Secondary | ICD-10-CM | POA: Diagnosis present

## 2022-10-15 DIAGNOSIS — I129 Hypertensive chronic kidney disease with stage 1 through stage 4 chronic kidney disease, or unspecified chronic kidney disease: Secondary | ICD-10-CM | POA: Diagnosis present

## 2022-10-15 DIAGNOSIS — E7800 Pure hypercholesterolemia, unspecified: Secondary | ICD-10-CM | POA: Diagnosis present

## 2022-10-15 DIAGNOSIS — Z886 Allergy status to analgesic agent status: Secondary | ICD-10-CM | POA: Diagnosis not present

## 2022-10-15 DIAGNOSIS — E669 Obesity, unspecified: Secondary | ICD-10-CM | POA: Diagnosis present

## 2022-10-15 DIAGNOSIS — R079 Chest pain, unspecified: Secondary | ICD-10-CM | POA: Diagnosis present

## 2022-10-15 DIAGNOSIS — Z833 Family history of diabetes mellitus: Secondary | ICD-10-CM

## 2022-10-15 DIAGNOSIS — Z888 Allergy status to other drugs, medicaments and biological substances status: Secondary | ICD-10-CM

## 2022-10-15 DIAGNOSIS — R7989 Other specified abnormal findings of blood chemistry: Secondary | ICD-10-CM | POA: Diagnosis present

## 2022-10-15 DIAGNOSIS — F419 Anxiety disorder, unspecified: Secondary | ICD-10-CM | POA: Diagnosis present

## 2022-10-15 DIAGNOSIS — D6800 Von Willebrand disease, unspecified: Secondary | ICD-10-CM | POA: Diagnosis present

## 2022-10-15 DIAGNOSIS — A419 Sepsis, unspecified organism: Secondary | ICD-10-CM | POA: Diagnosis present

## 2022-10-15 DIAGNOSIS — Z803 Family history of malignant neoplasm of breast: Secondary | ICD-10-CM

## 2022-10-15 DIAGNOSIS — F319 Bipolar disorder, unspecified: Secondary | ICD-10-CM | POA: Diagnosis present

## 2022-10-15 DIAGNOSIS — Z6831 Body mass index (BMI) 31.0-31.9, adult: Secondary | ICD-10-CM | POA: Diagnosis not present

## 2022-10-15 DIAGNOSIS — K219 Gastro-esophageal reflux disease without esophagitis: Secondary | ICD-10-CM | POA: Diagnosis present

## 2022-10-15 DIAGNOSIS — J9601 Acute respiratory failure with hypoxia: Secondary | ICD-10-CM | POA: Diagnosis present

## 2022-10-15 DIAGNOSIS — D45 Polycythemia vera: Secondary | ICD-10-CM | POA: Diagnosis present

## 2022-10-15 DIAGNOSIS — N189 Chronic kidney disease, unspecified: Secondary | ICD-10-CM | POA: Diagnosis present

## 2022-10-15 DIAGNOSIS — Z7982 Long term (current) use of aspirin: Secondary | ICD-10-CM

## 2022-10-15 DIAGNOSIS — Z79899 Other long term (current) drug therapy: Secondary | ICD-10-CM

## 2022-10-15 DIAGNOSIS — F32A Depression, unspecified: Secondary | ICD-10-CM | POA: Diagnosis present

## 2022-10-15 LAB — HEPATITIS PANEL, ACUTE
HCV Ab: NONREACTIVE
Hep A IgM: NONREACTIVE
Hep B C IgM: NONREACTIVE
Hepatitis B Surface Ag: NONREACTIVE

## 2022-10-15 LAB — LIPID PANEL
Cholesterol: 175 mg/dL (ref 0–200)
HDL: 49 mg/dL (ref >40)
LDL Cholesterol: 92 mg/dL (ref 0–99)
Total CHOL/HDL Ratio: 3.6 RATIO
Triglycerides: 172 mg/dL — ABNORMAL HIGH (ref <150)
VLDL: 34 mg/dL (ref 0–40)

## 2022-10-15 LAB — CBC WITH DIFFERENTIAL/PLATELET
Abs Immature Granulocytes: 0.09 10*3/uL — ABNORMAL HIGH (ref 0.00–0.07)
Basophils Absolute: 0.1 10*3/uL (ref 0.0–0.1)
Basophils Relative: 1 %
Eosinophils Absolute: 0.1 10*3/uL (ref 0.0–0.5)
Eosinophils Relative: 1 %
HCT: 41.3 % (ref 36.0–46.0)
Hemoglobin: 13.8 g/dL (ref 12.0–15.0)
Immature Granulocytes: 1 %
Lymphocytes Relative: 6 %
Lymphs Abs: 0.6 10*3/uL — ABNORMAL LOW (ref 0.7–4.0)
MCH: 27.8 pg (ref 26.0–34.0)
MCHC: 33.4 g/dL (ref 30.0–36.0)
MCV: 83.1 fL (ref 80.0–100.0)
Monocytes Absolute: 0.6 10*3/uL (ref 0.1–1.0)
Monocytes Relative: 6 %
Neutro Abs: 8.1 10*3/uL — ABNORMAL HIGH (ref 1.7–7.7)
Neutrophils Relative %: 85 %
Platelets: 330 10*3/uL (ref 150–400)
RBC: 4.97 MIL/uL (ref 3.87–5.11)
RDW: 14.5 % (ref 11.5–15.5)
WBC: 9.4 10*3/uL (ref 4.0–10.5)
nRBC: 0 % (ref 0.0–0.2)

## 2022-10-15 LAB — COMPREHENSIVE METABOLIC PANEL
ALT: 62 U/L — ABNORMAL HIGH (ref 0–44)
AST: 89 U/L — ABNORMAL HIGH (ref 15–41)
Albumin: 4.6 g/dL (ref 3.5–5.0)
Alkaline Phosphatase: 65 U/L (ref 38–126)
Anion gap: 7 (ref 5–15)
BUN: 9 mg/dL (ref 6–20)
CO2: 26 mmol/L (ref 22–32)
Calcium: 9.1 mg/dL (ref 8.9–10.3)
Chloride: 106 mmol/L (ref 98–111)
Creatinine, Ser: 0.7 mg/dL (ref 0.44–1.00)
GFR, Estimated: >60 mL/min (ref >60)
Glucose, Bld: 114 mg/dL — ABNORMAL HIGH (ref 70–99)
Potassium: 4.5 mmol/L (ref 3.5–5.1)
Sodium: 139 mmol/L (ref 135–145)
Total Bilirubin: 1.2 mg/dL (ref 0.3–1.2)
Total Protein: 7.6 g/dL (ref 6.5–8.1)

## 2022-10-15 LAB — TROPONIN I (HIGH SENSITIVITY)
Troponin I (High Sensitivity): 4 ng/L (ref <18)
Troponin I (High Sensitivity): 3 ng/L (ref <18)
Troponin I (High Sensitivity): 4 ng/L (ref <18)

## 2022-10-15 LAB — HIV ANTIBODY (ROUTINE TESTING W REFLEX): HIV Screen 4th Generation wRfx: NONREACTIVE

## 2022-10-15 LAB — LACTIC ACID, PLASMA
Lactic Acid, Venous: 1.9 mmol/L (ref 0.5–1.9)
Lactic Acid, Venous: 2.2 mmol/L (ref 0.5–1.9)
Lactic Acid, Venous: 2.3 mmol/L (ref 0.5–1.9)
Lactic Acid, Venous: 2.3 mmol/L (ref 0.5–1.9)

## 2022-10-15 LAB — RESP PANEL BY RT-PCR (RSV, FLU A&B, COVID)  RVPGX2
Influenza A by PCR: POSITIVE — AB
Influenza B by PCR: NEGATIVE
Resp Syncytial Virus by PCR: NEGATIVE
SARS Coronavirus 2 by RT PCR: NEGATIVE

## 2022-10-15 LAB — PROCALCITONIN: Procalcitonin: <0.1 ng/mL

## 2022-10-15 MED ORDER — PROPRANOLOL HCL 40 MG PO TABS
40.0000 mg | ORAL_TABLET | Freq: Two times a day (BID) | ORAL | Status: DC
  Administered 2022-10-15 – 2022-10-17 (×5): 40 mg via ORAL
  Filled 2022-10-15: qty 2
  Filled 2022-10-15 (×2): qty 1
  Filled 2022-10-15 (×2): qty 2

## 2022-10-15 MED ORDER — SODIUM CHLORIDE 0.9 % IV BOLUS
500.0000 mL | Freq: Once | INTRAVENOUS | Status: AC
  Administered 2022-10-15: 500 mL via INTRAVENOUS

## 2022-10-15 MED ORDER — FOLIC ACID 1 MG PO TABS
1.0000 mg | ORAL_TABLET | Freq: Every day | ORAL | Status: DC
  Administered 2022-10-15 – 2022-10-17 (×3): 1 mg via ORAL
  Filled 2022-10-15 (×3): qty 1

## 2022-10-15 MED ORDER — NICOTINE 21 MG/24HR TD PT24
21.0000 mg | MEDICATED_PATCH | Freq: Every day | TRANSDERMAL | Status: DC
  Administered 2022-10-15 – 2022-10-17 (×3): 21 mg via TRANSDERMAL
  Filled 2022-10-15 (×2): qty 1

## 2022-10-15 MED ORDER — HYDRALAZINE HCL 20 MG/ML IJ SOLN: 5.0000 mg | INTRAMUSCULAR | Status: DC | PRN

## 2022-10-15 MED ORDER — IPRATROPIUM-ALBUTEROL 0.5-2.5 (3) MG/3ML IN SOLN
3.0000 mL | Freq: Once | RESPIRATORY_TRACT | Status: AC
  Administered 2022-10-15: 3 mL via RESPIRATORY_TRACT
  Filled 2022-10-15: qty 3

## 2022-10-15 MED ORDER — ATORVASTATIN CALCIUM 20 MG PO TABS
20.0000 mg | ORAL_TABLET | Freq: Every evening | ORAL | Status: DC
  Administered 2022-10-15 – 2022-10-16 (×2): 20 mg via ORAL
  Filled 2022-10-15 (×2): qty 1

## 2022-10-15 MED ORDER — ONDANSETRON HCL 4 MG/2ML IJ SOLN
4.0000 mg | Freq: Three times a day (TID) | INTRAMUSCULAR | Status: DC | PRN
  Administered 2022-10-15 – 2022-10-17 (×3): 4 mg via INTRAVENOUS
  Filled 2022-10-15 (×4): qty 2

## 2022-10-15 MED ORDER — KETOROLAC TROMETHAMINE 30 MG/ML IJ SOLN
15.0000 mg | Freq: Once | INTRAMUSCULAR | Status: AC
  Administered 2022-10-15: 15 mg via INTRAVENOUS
  Filled 2022-10-15: qty 1

## 2022-10-15 MED ORDER — ASPIRIN 81 MG PO TBEC: 81.0000 mg | DELAYED_RELEASE_TABLET | Freq: Every day | ORAL | Status: DC

## 2022-10-15 MED ORDER — OXYCODONE-ACETAMINOPHEN 5-325 MG PO TABS
1.0000 | ORAL_TABLET | ORAL | Status: DC | PRN
  Administered 2022-10-15 – 2022-10-16 (×2): 1 via ORAL
  Filled 2022-10-15 (×2): qty 1

## 2022-10-15 MED ORDER — ACETAMINOPHEN 325 MG PO TABS: 650.0000 mg | ORAL_TABLET | Freq: Four times a day (QID) | ORAL | Status: DC | PRN

## 2022-10-15 MED ORDER — LORATADINE 10 MG PO TABS: 10.0000 mg | ORAL_TABLET | Freq: Every day | ORAL | Status: DC | PRN

## 2022-10-15 MED ORDER — OSELTAMIVIR PHOSPHATE 75 MG PO CAPS
75.0000 mg | ORAL_CAPSULE | Freq: Two times a day (BID) | ORAL | Status: DC
  Administered 2022-10-15 – 2022-10-17 (×5): 75 mg via ORAL
  Filled 2022-10-15 (×5): qty 1

## 2022-10-15 MED ORDER — METHYLPREDNISOLONE SODIUM SUCC 125 MG IJ SOLR
125.0000 mg | Freq: Once | INTRAMUSCULAR | Status: AC
  Administered 2022-10-15: 125 mg via INTRAVENOUS
  Filled 2022-10-15: qty 2

## 2022-10-15 MED ORDER — ALBUTEROL SULFATE (2.5 MG/3ML) 0.083% IN NEBU
2.5000 mg | INHALATION_SOLUTION | RESPIRATORY_TRACT | Status: DC | PRN
  Administered 2022-10-17: 2.5 mg via RESPIRATORY_TRACT
  Filled 2022-10-15: qty 3

## 2022-10-15 MED ORDER — SODIUM CHLORIDE 0.9 % IV BOLUS
1500.0000 mL | Freq: Once | INTRAVENOUS | Status: AC
  Administered 2022-10-15: 1500 mL via INTRAVENOUS

## 2022-10-15 MED ORDER — IBUPROFEN 400 MG PO TABS
200.0000 mg | ORAL_TABLET | Freq: Four times a day (QID) | ORAL | Status: DC | PRN
  Administered 2022-10-15 – 2022-10-17 (×3): 200 mg via ORAL
  Filled 2022-10-15 (×3): qty 1

## 2022-10-15 MED ORDER — DM-GUAIFENESIN ER 30-600 MG PO TB12
1.0000 | ORAL_TABLET | Freq: Two times a day (BID) | ORAL | Status: DC | PRN
  Administered 2022-10-15 – 2022-10-17 (×3): 1 via ORAL
  Filled 2022-10-15 (×3): qty 1

## 2022-10-15 MED ORDER — NITROGLYCERIN 0.4 MG SL SUBL: 0.4000 mg | SUBLINGUAL_TABLET | SUBLINGUAL | Status: DC | PRN

## 2022-10-15 MED ORDER — OSELTAMIVIR PHOSPHATE 75 MG PO CAPS
75.0000 mg | ORAL_CAPSULE | Freq: Once | ORAL | Status: DC
  Filled 2022-10-15: qty 1

## 2022-10-15 MED ORDER — METHYLPREDNISOLONE SODIUM SUCC 40 MG IJ SOLR
40.0000 mg | Freq: Two times a day (BID) | INTRAMUSCULAR | Status: DC
  Administered 2022-10-15 – 2022-10-17 (×4): 40 mg via INTRAVENOUS
  Filled 2022-10-15 (×4): qty 1

## 2022-10-15 MED ORDER — LORAZEPAM 2 MG/ML IJ SOLN
1.0000 mg | Freq: Once | INTRAMUSCULAR | Status: AC
  Administered 2022-10-15: 1 mg via INTRAVENOUS
  Filled 2022-10-15: qty 1

## 2022-10-15 MED ORDER — IPRATROPIUM-ALBUTEROL 0.5-2.5 (3) MG/3ML IN SOLN
3.0000 mL | RESPIRATORY_TRACT | Status: DC
  Administered 2022-10-15 – 2022-10-16 (×9): 3 mL via RESPIRATORY_TRACT
  Filled 2022-10-15 (×9): qty 3

## 2022-10-15 MED ORDER — SODIUM CHLORIDE 0.9 % IV SOLN
12.5000 mg | Freq: Once | INTRAVENOUS | Status: AC
  Administered 2022-10-16: 12.5 mg via INTRAVENOUS
  Filled 2022-10-15: qty 0.5

## 2022-10-15 MED ORDER — FERROUS SULFATE 325 (65 FE) MG PO TABS
325.0000 mg | ORAL_TABLET | Freq: Every day | ORAL | Status: DC
  Administered 2022-10-16 – 2022-10-17 (×2): 325 mg via ORAL
  Filled 2022-10-15 (×2): qty 1

## 2022-10-15 MED ORDER — LORAZEPAM 0.5 MG PO TABS
0.5000 mg | ORAL_TABLET | Freq: Four times a day (QID) | ORAL | Status: DC | PRN
  Administered 2022-10-15 – 2022-10-17 (×3): 0.5 mg via ORAL
  Filled 2022-10-15 (×3): qty 1

## 2022-10-15 MED ORDER — IOHEXOL 350 MG/ML SOLN
75.0000 mL | Freq: Once | INTRAVENOUS | Status: AC | PRN
  Administered 2022-10-15: 75 mL via INTRAVENOUS

## 2022-10-15 MED ORDER — PANTOPRAZOLE SODIUM 40 MG PO TBEC
40.0000 mg | DELAYED_RELEASE_TABLET | Freq: Every day | ORAL | Status: DC
  Administered 2022-10-15 – 2022-10-17 (×3): 40 mg via ORAL
  Filled 2022-10-15 (×3): qty 1

## 2022-10-15 MED FILL — JAKAFI 15 MG TABLET: ORAL | 30 days supply | Qty: 60 | Fill #2

## 2022-10-15 NOTE — ED Notes (Signed)
Md Blaine Hamper notified that patient breathing 44-45 breaths a minute. Respiratory called, and patient placed on bipap. See order.

## 2022-10-15 NOTE — ED Provider Notes (Signed)
[facility] Provider Note    Event Date/Time   First MD Initiated Contact with Patient 10/15/22 [phone]     (approximate)   History   No chief complaint on file.   HPI  Lauren Escobar is a 56 y.o. female with a history of emphysema presents to ER for evaluation 24 hours worsening shortness of breath and chest discomfort.  Feels like she is needed to cough up phlegm but is nonproductive.  No measured fevers or chills.  Not wearing oxygen at this time.  She is tachypneic.  No nausea or vomiting.     Physical Exam   Triage Vital Signs: ED Triage Vitals  Enc Vitals Group     BP 10/15/22 0523 123/85     Pulse Rate 10/15/22 0523 (!) 123     Resp 10/15/22 0523 (!) 25     Temp 10/15/22 0523 98.7 F (37.1 C)     Temp Source 10/15/22 0523 Oral     SpO2 10/15/22 0523 97 %     Weight 10/15/22 0522 170 lb (77.1 kg)     Height 10/15/22 0522 '5\' 2"'$  (1.575 m)     Head Circumference --      Peak Flow --      Pain Score 10/15/22 0521 3     Pain Loc --      Pain Edu? --      Excl. in [place]? --     Most recent vital signs: Vitals:   10/15/22 0523 10/15/22 0928  BP: 123/85 114/73  Pulse: (!) 123 (!) 110  Resp: (!) 25 18  Temp: 98.7 F (37.1 C)   SpO2: 97% 97%     Constitutional: Alert  Eyes: Conjunctivae are normal.  Head: Atraumatic. Nose: No congestion/rhinnorhea. Mouth/Throat: Mucous membranes are moist.   Neck: Painless ROM.  Cardiovascular:   Good peripheral circulation. Respiratory: Tachypnea with use of accessory muscles.  Coarse posterior breath sounds. Gastrointestinal: Soft and nontender.  Musculoskeletal:  no deformity Neurologic:  MAE spontaneously. No gross focal neurologic deficits are appreciated.  Skin:  Skin is warm, dry and intact. No rash noted. Psychiatric: Mood and affect are normal. Speech and behavior are normal.    ED Results / Procedures / Treatments   Labs (all labs ordered are listed, but only abnormal results are  displayed) Labs Reviewed  RESP PANEL BY RT-PCR (RSV, FLU A&B, COVID)  RVPGX2 - Abnormal; Notable for the following components:      Result Value   Influenza A by PCR POSITIVE (*)    All other components within normal limits  CBC WITH DIFFERENTIAL/PLATELET - Abnormal; Notable for the following components:   Neutro Abs 8.1 (*)    Lymphs Abs 0.6 (*)    Abs Immature Granulocytes 0.09 (*)    All other components within normal limits  COMPREHENSIVE METABOLIC PANEL - Abnormal; Notable for the following components:   Glucose, Bld 114 (*)    AST 89 (*)    ALT 62 (*)    All other components within normal limits  LACTIC ACID, PLASMA  LACTIC ACID, PLASMA  TROPONIN I (HIGH SENSITIVITY)  TROPONIN I (HIGH SENSITIVITY)     EKG  ED ECG REPORT I, Merlyn Lot, the attending physician, personally viewed and interpreted this ECG.   Date: 10/15/2022  EKG Time: 5:22  Rate: 125  Rhythm: sinus  Axis: normal  Intervals: normal  ST&T Change: no stemi, no depressions    RADIOLOGY Please see ED Course for  my review and interpretation.  I personally reviewed all radiographic images ordered to evaluate for the above acute complaints and reviewed radiology reports and findings.  These findings were personally discussed with the patient.  Please see medical record for radiology report.    PROCEDURES:  Critical Care performed: No  Procedures   MEDICATIONS ORDERED IN ED: Medications  oseltamivir (TAMIFLU) capsule 75 mg (has no administration in time range)  ipratropium-albuterol (DUONEB) 0.5-2.5 (3) MG/3ML nebulizer solution 3 mL (has no administration in time range)  ipratropium-albuterol (DUONEB) 0.5-2.5 (3) MG/3ML nebulizer solution 3 mL (3 mLs Nebulization Given 10/15/22 0757)  methylPREDNISolone sodium succinate (SOLU-MEDROL) 125 mg/2 mL injection 125 mg (125 mg Intravenous Given 10/15/22 0757)  ipratropium-albuterol (DUONEB) 0.5-2.5 (3) MG/3ML nebulizer solution 3 mL (3 mLs  Nebulization Given 10/15/22 0756)  sodium chloride 0.9 % bolus 500 mL (500 mLs Intravenous New Bag/Given 10/15/22 0802)  ketorolac (TORADOL) 30 MG/ML injection 15 mg (15 mg Intravenous Given 10/15/22 0757)     IMPRESSION / MDM / ASSESSMENT AND PLAN / ED COURSE  I reviewed the triage vital signs and the nursing notes.                              Differential diagnosis includes, but is not limited to, Asthma, copd, CHF, pna, ptx, malignancy, Pe, anemia  Patient presenting to the ER for evaluation of symptoms as described above.  Based on symptoms, risk factors and considered above differential, this presenting complaint could reflect a potentially life-threatening illness therefore the patient will be placed on continuous pulse oximetry and telemetry for monitoring.  Laboratory evaluation will be sent to evaluate for the above complaints.     Clinical Course as of 10/15/22 0947  Tue Oct 15, 2022  0817 Chest x-ray my review interpretation does not show any evidence of consolidation or pneumothorax. [PR]  [id number] Patient observed in the ER after several nebulizer treatments as well as Solu-Medrol.  Patient with persistent tachypnea.  She is not hypoxic but with frequent cough and with ambulation felt very lightheaded and weak.  She became significantly tachypneic to the 30s with ambulation.  Based on her history and presentation with acute influenza illness will consult hospitalist for admission. [PR]    Clinical Course User Index [PR] Merlyn Lot, MD      FINAL CLINICAL IMPRESSION(S) / ED DIAGNOSES   Final diagnoses:  Chronic obstructive pulmonary disease with acute exacerbation ([place])  Influenza A     Rx / DC Orders   ED Discharge Orders     None        Note:  This document was prepared using Dragon voice recognition software and may include unintentional dictation errors.    Merlyn Lot, MD 10/15/22 [phone]

## 2022-10-15 NOTE — Progress Notes (Signed)
       CROSS COVER NOTE  NAME: Lauren Escobar MRN: [id number] DOB : 11/08/1965 ATTENDING PHYSICIAN: Ivor Costa, MD    Date of Service   10/15/2022   HPI/Events of Note   Medication request received for nausea refractory to Zofran  Interventions   Assessment/Plan:  Phenergan       To reach the provider On-Call:   7AM- 7PM see care teams to locate the attending and reach out to them via [email or web address]. 7PM-7AM contact night-coverage If you still have difficulty reaching the appropriate provider, please page the [facility] (Director on Call) for [facility] on amion for assistance  This document was prepared using Set designer software and may include unintentional dictation errors.  Neomia Glass DNP, MBA, FNP-BC Nurse Practitioner [facility] Pager [phone]

## 2022-10-15 NOTE — ED Notes (Signed)
Report given to Hannah, Rn

## 2022-10-15 NOTE — ED Notes (Addendum)
Patient breathing 44-45 breaths a minute. Called respiratory to come take a look at the patient, and or to find out if patient is a candidate for cpap or bipap.

## 2022-10-15 NOTE — ED Triage Notes (Signed)
Ambulatory to triage with c/o shortness of breath,chest pain and intermittent fever starting last night. Pt visibly anxious in triage Pt reports taking Robitussin tonight without relief.

## 2022-10-15 NOTE — H&P (Addendum)
History and Physical    Lauren Escobar DGU:[id number] DOB: 1966-06-13 DOA: 10/15/2022  Referring MD/NP/PA:   PCP: Letta Median, MD   Patient coming from:  The patient is coming from home.    Chief Complaint: SOB  HPI: Lauren Escobar is a 56 y.o. female with medical history significant of hypertension, hyperlipidemia, diet-controlled diabetes, COPD, asthma, GERD, depression with anxiety, VWF disease, tobacco abuse, CAD, polycythemia vera, obesity with BMI 31.09, who presents with shortness breath.  Patient states that her symptoms started last night, which has been progressively worsening.  Patient has dry cough, shortness breath, wheezing, subjective fever, chills, body aches, malaise.  No nausea, vomiting, diarrhea or abdominal pain.  No symptoms of UTI.  Patient also has chest pain, which is located in the front chest, 5 out of 10 in severity, pleuritic, aggravated by deep breath and coughing, nonradiating.  Patient does not have symptoms of UTI.  Patient is very anxious during the interview.  Patient does not have active bleeding.  Addendum: Patient respiratory status has deteriorated in ED, developed acute respiratory distress, cannot speak in full sentence, respiration rate is up to 44, BiPAP is started.   Data reviewed independently and ED Course: pt was found to have positive flu PCR, WBC 9.4, lactic acid 1.9, 2.2, troponin level 4, GFR> 60, abnormal liver function (ALP 65, AST 89, ALT 62, total bilirubin 1.2), temperature normal, blood pressure 114/73, heart rate of 123, 25, oxygen saturation 97% on room air.  Chest x-ray showed bronchitis no obvious infiltration.  CTA negative for PE. Patient is admitted to PCU as inpatient     EKG: I have personally reviewed.  Sinus rhythm, heart rate 125, QTc 441, left atrial enlargement, low voltage.   Review of Systems:   General: Has subjective fevers, chills, no body weight gain, has poor appetite, has fatigue HEENT: no  blurry vision, hearing changes or sore throat Respiratory: has dyspnea, coughing, wheezing CV: Has chest pain, no palpitations GI: no nausea, vomiting, abdominal pain, diarrhea, constipation GU: no dysuria, burning on urination, increased urinary frequency, hematuria  Ext: no leg edema Neuro: no unilateral weakness, numbness, or tingling, no vision change or hearing loss Skin: no rash, no skin tear. MSK: No muscle spasm, no deformity, no limitation of range of movement in spin Heme: No easy bruising.  Travel history: No recent long distant travel.   Allergy:  Allergies  Allergen Reactions   Aspirin    Pregabalin Other (See Comments)    Unsteady gate, causing falls   Trazodone Other (See Comments)    weakness   Tramadol Rash    Past Medical History:  Diagnosis Date   Abdominal pain    Abnormal mammogram    Anxiety    Asthma    Bleeding disorder (HCC)    Blood dyscrasia    polycythemia vera   Chronic kidney disease    COPD (chronic obstructive pulmonary disease) (HCC)    Cough    Depression    Diabetes mellitus without complication (HCC)    Fatigue    Headache 2000   Horizontal nystagmus    age 35   Hyperglycemia    Hypertension    Low back pain    Major depressive disorder    Nasal lesion    Neck pain    Snoring    SOB (shortness of breath)    Tobacco use    UTI (urinary tract infection)    Von Willebrand's disease ([employer])  Past Surgical History:  Procedure Laterality Date   ABDOMINAL HYSTERECTOMY  2002   oophorectomy   ABDOMINAL HYSTERECTOMY     APPENDECTOMY     BLADDER SURGERY     BREAST BIOPSY Left    NEG   CARDIAC CATHETERIZATION     CARDIAC CATHETERIZATION N/A 07/17/2016   Procedure: Left Heart Cath and Coronary Angiography;  Surgeon: Corey Skains, MD;  Location: [facility];  Service: Cardiovascular;  Laterality: N/A;   COLONOSCOPY WITH PROPOFOL N/A 07/27/2015   Procedure: COLONOSCOPY WITH PROPOFOL;  Surgeon: Manya Silvas, MD;   Location: [facility] ENDOSCOPY;  Service: Endoscopy;  Laterality: N/A;   COLONOSCOPY WITH PROPOFOL N/A 05/31/2019   Procedure: COLONOSCOPY WITH PROPOFOL;  Surgeon: Lin Landsman, MD;  Location: [facility] ENDOSCOPY;  Service: Gastroenterology;  Laterality: N/A;   ESOPHAGOGASTRODUODENOSCOPY N/A 06/18/2017   Procedure: ESOPHAGOGASTRODUODENOSCOPY (EGD);  Surgeon: Lin Landsman, MD;  Location: [facility] ENDOSCOPY;  Service: Gastroenterology;  Laterality: N/A;   ESOPHAGOGASTRODUODENOSCOPY (EGD) WITH PROPOFOL N/A 05/31/2019   Procedure: ESOPHAGOGASTRODUODENOSCOPY (EGD) WITH PROPOFOL;  Surgeon: Lin Landsman, MD;  Location: [place];  Service: Gastroenterology;  Laterality: N/A;   REPLACEMENT TOTAL KNEE Left    SHOULDER SURGERY Left    x 2    Social History:  reports that she quit smoking about 1 years ago. Her smoking use included cigarettes. She has a 79.75 pack-year smoking history. She has never used smokeless tobacco. She reports that she does not drink alcohol and does not use drugs.  Family History:  Family History  Problem Relation Age of Onset   Cancer Father        prostate   Stroke Father    Diabetes Father    Cancer Mother        bladder   Breast cancer Maternal Grandmother      Prior to Admission medications   Medication Sig Start Date End Date Taking? Authorizing Provider  albuterol (VENTOLIN HFA) 108 (90 Base) MCG/ACT inhaler Inhale into the lungs. 11/03/15   [provider]  amitriptyline (ELAVIL) 50 MG tablet TAKE 1 TABLET BY MOUTH EVERYDAY AT BEDTIME Patient not taking: Reported on 09/11/2022 03/01/20   Lin Landsman, MD  aspirin EC 81 MG tablet Take by mouth. 05/21/17 05/21/26  [provider]  atorvastatin (LIPITOR) 20 MG tablet TAKE 1 TABLET BY MOUTH AT BEDTIME FOR HIGH CHOLESTEROL 04/21/19   [provider]  baclofen (LIORESAL) 10 MG tablet Take 10 mg by mouth 3 (three) times daily as needed. Patient not taking: Reported on 09/11/2022  11/12/19   [provider]  cetirizine (ZYRTEC) 10 MG tablet TAKE 1 TABLET BY MOUTH DAILY FOR ALLERGIES 04/27/19   [provider]  cyclobenzaprine (FLEXERIL) 5 MG tablet Take 5 mg by mouth 3 (three) times daily. Patient not taking: Reported on 09/11/2022 08/30/19   [provider]  diclofenac Sodium (VOLTAREN) 1 % GEL  12/06/19   [provider]  dicyclomine (BENTYL) 20 MG tablet TAKE 1 TABLET (20 MG TOTAL) BY MOUTH 4 (FOUR) TIMES DAILY - BEFORE MEALS AND AT BEDTIME. 02/23/20 03/24/20  Lin Landsman, MD  esomeprazole (NEXIUM) 40 MG capsule Take 40 mg by mouth daily.  08/07/14   [provider]  ferrous sulfate 325 (65 FE) MG tablet Take by mouth. 04/25/22 03/18/23  [provider]  fluticasone furoate-vilanterol (BREO ELLIPTA) 100-25 MCG/ACT AEPB Inhale 1 puff into the lungs daily. 09/11/22 09/11/23  Armando Reichert, MD  folic acid (FOLVITE) 1 MG  tablet Take 1 mg by mouth daily. 05/01/19   [provider]  gabapentin (NEURONTIN) 300 MG capsule Take 300 mg by mouth 3 (three) times daily. Patient not taking: Reported on 09/23/2022 10/30/19   [provider]  hydrOXYzine (ATARAX/VISTARIL) 25 MG tablet TAKE 1 4 ORAL TABLETS UP TO TWICE DAILY AS NEEDED FOR SEVERE ANXIETY OR SLEEP Patient not taking: Reported on 09/23/2022 06/01/19   [provider]  hydrOXYzine (ATARAX/VISTARIL) 50 MG tablet SMARTSIG:1 Tablet(s) By Mouth 1 to 3 Times Daily Patient not taking: Reported on 09/23/2022 12/14/19   [provider]  ipratropium-albuterol (DUONEB) 0.5-2.5 (3) MG/3ML SOLN 3 mL Q4H (RT).  Patient not taking: Reported on 09/11/2022 07/11/14   [provider]  isosorbide mononitrate (IMDUR) 30 MG 24 hr tablet Take 30 mg by mouth daily. 05/01/19   [provider]  [name] tablet Take 15 mg by mouth 2 (two) times daily. Patient not taking: Reported on 09/23/2022    [provider]  Multiple  Vitamins-Minerals (MULTIVIT/MULTIMINERAL ADULT PO) Take by mouth.    [provider]  NITROGLYCERIN SL Place 1 tablet under the tongue. Patient not taking: Reported on 09/11/2022    [provider]  oxybutynin (DITROPAN-XL) 5 MG 24 hr tablet TAKE 1 TABLET BY MOUTH EVERYDAY AT BEDTIME Patient not taking: Reported on 09/11/2022 02/23/20   Lin Landsman, MD  PREMARIN 0.625 MG tablet Take 0.625 mg by mouth daily. Patient not taking: Reported on 09/11/2022 12/07/19   [provider]  propranolol (INDERAL) 40 MG tablet TAKE 1 TABLET TWICE DAILY TO PREVENT HEADACHES 05/24/19   [provider]  REXULTI 0.5 MG TABS TAKE 1 TABLET BY MOUTH AT BEDTIME FOR 1 WEEK THEN INCREASE TO 1 MG TABLET Patient not taking: Reported on 09/11/2022 10/20/19   [provider]  risperiDONE (RISPERDAL) 0.5 MG tablet TAKE 1 TABLET BY MOUTH AT BEDTIME FOR 1 WEEK, THEN TAKE 2 TABLETS BY MOUTH AT BEDTIME THEREAFTER Patient not taking: Reported on 09/23/2022 07/28/19   [provider]  SUMAtriptan (IMITREX) 50 MG tablet Take by mouth. Patient not taking: Reported on 09/23/2022 02/01/13   [provider]  tamsulosin (FLOMAX) 0.4 MG CAPS capsule Take by mouth. Patient not taking: Reported on 09/23/2022 07/08/18   [provider]  Tiotropium Bromide Monohydrate (SPIRIVA RESPIMAT) 2.5 MCG/ACT AERS Inhale 2 puffs into the lungs daily. Patient not taking: Reported on 09/23/2022 09/11/22 09/11/23  Armando Reichert, MD  triamcinolone cream (KENALOG) 0.1 % APPLY TO AFFECTED AREA (BACK OF NECK) TWICE DAILY FOR UP TO 2 WEEKS FOR CONTACT DERMATITIS Patient not taking: Reported on 09/23/2022 09/20/19   [provider]  venlafaxine XR (EFFEXOR-XR) 150 MG 24 hr capsule Take 300 mg by mouth every morning. Patient not taking: Reported on 09/23/2022 10/14/19   [provider]  VRAYLAR capsule Take 1.5 mg by mouth. Patient not taking: Reported on 09/23/2022  11/16/19   [provider]  VYVANSE 40 MG capsule Take 40 mg by mouth every morning. Patient not taking: Reported on 09/23/2022 11/16/19   [provider]    Physical Exam: Vitals:   10/15/22 0955 10/15/22 1215 10/15/22 1231 10/15/22 1347  BP:    118/81  Pulse:   (!) 104 94  Resp:  (!) 44  19  Temp: 98 F (36.7 C)   97.6 F (36.4 C)  TempSrc: Oral   Axillary  SpO2:   96% 100%  Weight:      Height:  General: Not in acute distress HEENT:       Eyes: PERRL, EOMI, no scleral icterus.       ENT: No discharge from the ears and nose, no pharynx injection, no tonsillar enlargement.        Neck: No JVD, no bruit, no mass felt. Heme: No neck lymph node enlargement. Cardiac: S1/S2, RRR, No murmurs, No gallops or rubs. Respiratory: Has wheezing bilaterally GI: Soft, nondistended, nontender, no rebound pain, no organomegaly, BS present. GU: No hematuria Ext: No pitting leg edema bilaterally. 1+DP/PT pulse bilaterally. Musculoskeletal: No joint deformities, No joint redness or warmth, no limitation of ROM in spin. Skin: No rashes.  Neuro: Alert, oriented X3, cranial nerves II-XII grossly intact, moves all extremities normally.  Psych: Patient is not psychotic, no suicidal or hemocidal ideation.  Patient has anxiety.  Labs on Admission: I have personally reviewed following labs and imaging studies  CBC: Recent Labs  Lab 10/15/22 0531  WBC 9.4  NEUTROABS 8.1*  HGB 13.8  HCT 41.3  MCV 83.1  PLT 536   Basic Metabolic Panel: Recent Labs  Lab 10/15/22 0531  NA 139  K 4.5  CL 106  CO2 26  GLUCOSE 114*  BUN 9  CREATININE 0.70  CALCIUM 9.1   GFR: Estimated Creatinine Clearance: 74.6 mL/min (by C-G formula based on SCr of 0.7 mg/dL). Liver Function Tests: Recent Labs  Lab 10/15/22 0531  AST 89*  ALT 62*  ALKPHOS 65  BILITOT 1.2  PROT 7.6  ALBUMIN 4.6   No results for input(s): "LIPASE", "AMYLASE" in the last 168 hours. No results for input(s):  "AMMONIA" in the last 168 hours. Coagulation Profile: No results for input(s): "INR", "PROTIME" in the last 168 hours. Cardiac Enzymes: No results for input(s): "CKTOTAL", "CKMB", "CKMBINDEX", "TROPONINI" in the last 168 hours. BNP (last 3 results) No results for input(s): "PROBNP" in the last 8760 hours. HbA1C: No results for input(s): "HGBA1C" in the last 72 hours. CBG: Recent Labs  Lab 10/09/22 0916 10/09/22 1021  GLUCAP 114* 111*   Lipid Profile: Recent Labs    10/15/22 1157  CHOL 175  HDL 49  LDLCALC 92  TRIG 172*  CHOLHDL 3.6   Thyroid Function Tests: No results for input(s): "TSH", "T4TOTAL", "FREET4", "T3FREE", "THYROIDAB" in the last 72 hours. Anemia Panel: No results for input(s): "VITAMINB12", "FOLATE", "FERRITIN", "TIBC", "IRON", "RETICCTPCT" in the last 72 hours. Urine analysis:    Component Value Date/Time   COLORURINE YELLOW (A) 03/11/2021 1758   APPEARANCEUR CLOUDY (A) 03/11/2021 1758   APPEARANCEUR Hazy 06/10/2014 1142   LABSPEC 1.028 03/11/2021 1758   LABSPEC 1.018 06/10/2014 1142   PHURINE 5.0 03/11/2021 1758   GLUCOSEU NEGATIVE 03/11/2021 1758   GLUCOSEU Negative 06/10/2014 1142   HGBUR LARGE (A) 03/11/2021 1758   BILIRUBINUR NEGATIVE 03/11/2021 1758   BILIRUBINUR Negative 06/10/2014 1142   [place] 03/11/2021 1758   PROTEINUR 100 (A) 03/11/2021 1758   NITRITE NEGATIVE 03/11/2021 1758   LEUKOCYTESUR LARGE (A) 03/11/2021 1758   LEUKOCYTESUR 1+ 06/10/2014 1142   Sepsis Labs: '@LABRCNTIP'$ (procalcitonin:4,lacticidven:4) ) Recent Results (from the past 240 hour(s))  Resp panel by RT-PCR (RSV, Flu A&B, Covid) Anterior Nasal Swab     Status: Abnormal   Collection Time: 10/15/22  5:25 AM   Specimen: Anterior Nasal Swab  Result Value Ref Range Status   SARS Coronavirus 2 by RT PCR NEGATIVE NEGATIVE Final    Comment: (NOTE) SARS-CoV-2 target nucleic acids are NOT DETECTED.  The SARS-CoV-2 RNA is  generally detectable in upper  respiratory specimens during the acute phase of infection. The lowest concentration of SARS-CoV-2 viral copies this assay can detect is 138 copies/mL. A negative result does not preclude SARS-Cov-2 infection and should not be used as the sole basis for treatment or other patient management decisions. A negative result may occur with  improper specimen collection/handling, submission of specimen other than nasopharyngeal swab, presence of viral mutation(s) within the areas targeted by this assay, and inadequate number of viral copies(<138 copies/mL). A negative result must be combined with clinical observations, patient history, and epidemiological information. The expected result is Negative.  Fact Sheet for Patients:  EntrepreneurPulse.com.au  Fact Sheet for Healthcare Providers:  IncredibleEmployment.be  This test is no t yet approved or cleared by the [place] FDA and  has been authorized for detection and/or diagnosis of SARS-CoV-2 by FDA under an Emergency Use Authorization (EUA). This EUA will remain  in effect (meaning this test can be used) for the duration of the COVID-19 declaration under Section 564(b)(1) of the Act, 21 U.S.C.section 360bbb-3(b)(1), unless the authorization is terminated  or revoked sooner.       Influenza A by PCR POSITIVE (A) NEGATIVE Final   Influenza B by PCR NEGATIVE NEGATIVE Final    Comment: (NOTE) The Xpert Xpress SARS-CoV-2/FLU/RSV plus assay is intended as an aid in the diagnosis of influenza from Nasopharyngeal swab specimens and should not be used as a sole basis for treatment. Nasal washings and aspirates are unacceptable for Xpert Xpress SARS-CoV-2/FLU/RSV testing.  Fact Sheet for Patients: EntrepreneurPulse.com.au  Fact Sheet for Healthcare Providers: IncredibleEmployment.be  This test is not yet approved or cleared by the [place] FDA and has been  authorized for detection and/or diagnosis of SARS-CoV-2 by FDA under an Emergency Use Authorization (EUA). This EUA will remain in effect (meaning this test can be used) for the duration of the COVID-19 declaration under Section 564(b)(1) of the Act, 21 U.S.C. section 360bbb-3(b)(1), unless the authorization is terminated or revoked.     Resp Syncytial Virus by PCR NEGATIVE NEGATIVE Final    Comment: (NOTE) Fact Sheet for Patients: EntrepreneurPulse.com.au  Fact Sheet for Healthcare Providers: IncredibleEmployment.be  This test is not yet approved or cleared by the [place] FDA and has been authorized for detection and/or diagnosis of SARS-CoV-2 by FDA under an Emergency Use Authorization (EUA). This EUA will remain in effect (meaning this test can be used) for the duration of the COVID-19 declaration under Section 564(b)(1) of the Act, 21 U.S.C. section 360bbb-3(b)(1), unless the authorization is terminated or revoked.  Performed at [facility], [address] [place], [place] [address]      Radiological Exams on Admission: CT Angio Chest Pulmonary Embolism (PE) W or WO Contrast  Result Date: 10/15/2022 CLINICAL DATA:  Short of breath, chest pain and intermittent fever since yesterday EXAM: CT ANGIOGRAPHY CHEST WITH CONTRAST TECHNIQUE: Multidetector CT imaging of the chest was performed using the standard protocol during bolus administration of intravenous contrast. Multiplanar CT image reconstructions and MIPs were obtained to evaluate the vascular anatomy. RADIATION DOSE REDUCTION: This exam was performed according to the departmental dose-optimization program which includes automated exposure control, adjustment of the mA and/or kV according to patient size and/or use of iterative reconstruction technique. CONTRAST:  17m OMNIPAQUE IOHEXOL 350 MG/ML SOLN COMPARISON:  10/15/2022, 12/16/2020 FINDINGS: Cardiovascular: This is a  technically adequate evaluation of the pulmonary vasculature. No filling defects or pulmonary emboli. The heart is unremarkable without pericardial effusion.  No evidence of thoracic aortic aneurysm or dissection. Atherosclerosis of the aortic arch. Mediastinum/Nodes: No enlarged mediastinal, hilar, or axillary lymph nodes. Thyroid gland, trachea, and esophagus demonstrate no significant findings. Lungs/Pleura: No acute airspace disease, effusion, or pneumothorax. Central airways are patent. Upper Abdomen: No acute abnormality.  Hepatic steatosis. Musculoskeletal: No acute or destructive bony lesions. Reconstructed images demonstrate no additional findings. Review of the MIP images confirms the above findings. IMPRESSION: 1. No evidence of pulmonary embolus. 2. No acute intrathoracic process. 3. Hepatic steatosis. 4.  Aortic Atherosclerosis (ICD10-I70.0). Electronically Signed   By: Randa Ngo M.D.   On: 10/15/2022 11:36   DG Chest Portable 1 View  Result Date: 10/15/2022 CLINICAL DATA:  Chest pain and shortness of breath. Intermittent fever. EXAM: PORTABLE CHEST 1 VIEW COMPARISON:  CT 09/12/2022 FINDINGS: Heart size and mediastinal contours are unremarkable. Central airway thickening with peribronchial cuffing. No pleural effusion or edema. No airspace opacities. Visualized osseous structures are unremarkable. IMPRESSION: Central airway thickening with peribronchial cuffing compatible with bronchitis or reactive airways disease. Electronically Signed   By: Kerby Moors M.D.   On: 10/15/2022 05:59      Assessment/Plan Principal Problem:   Influenza A Active Problems:   COPD exacerbation (HCC)   Severe sepsis (HCC)   Chest pain   CAD (coronary artery disease)   HTN (hypertension)   Abnormal LFTs   Depression with anxiety   Polycythemia vera (HCC)   Tobacco abuse   Obesity with body mass index (BMI) of 30.0 to 39.9   Von Willebrand's disease ([place])   Assessment and Plan:  Influenza A,  and COPD exacerbation and severe sepsis due to Flu A infection: Patient does not have oxygen desaturation but has acute respiratory distress, respiration rate up to 44, started BiPAP.  This is a partly due to anxiety.  Patient meets criteria for severe sepsis with heart rate 123, RR 25, lactic acid 1.9 -->  2.2. CTA negative for PE  -will admit to PCU as inpatient -BiPAP -Bronchodilators -Solu-Medrol 40 mg IV bid -Tamiflu -Mucinex for cough  -Incentive spirometry -sputum culture -Nasal cannula oxygen as needed to maintain O2 saturation 93% or greater when pt is off BiPAP -As needed Ativan for anxiety  Hx of CAD and chest pain: Patient had some chest pain earlier, which has resolved.  CTA negative for PE.  Troponin negative x 3. -check A1c and FLP -pt is allergic to ASA -lipitor -prn NTG.   HTN (hypertension) -Propranolol which is also for headache prophylaxis -IV hydralazine as needed  Abnormal LFTs: Patient has some mild abnormal liver function with ALP 65, AST 89, ALT 62, total bilirubin 1.2, may be due to ongoing viral infection.  Patient denies heavy drinking of alcohol. -Avoid using Tylenol -Check hepatitis panel  Depression with anxiety: Patient is not taking medications currently. - as needed Ativan for anxiety  Polycythemia vera ([place]): Hemoglobin 13.8.  Patient used to be taking [place]. -f/u with hematologist -Follow-up with CBC  Tobacco abuse -Nicotine patch  Obesity with body mass index (BMI) of 30.0 to 39.9: Body weight 77.1 kg, BMI 31.09 -Healthy diet, exercise -Encouraged losing weight  Von Willebrand's disease ([place]): No active bleeding. -Avoid using Lovenox and heparin      DVT ppx: SCD  Code Status: Full code  Family Communication: I offered to call her family, but patient states that I do not need to call her family since her boyfriend was here earlier, he knows what is going on for her.  Disposition  Plan:  Anticipate discharge back to previous  environment  Consults called:  none  Admission status and Level of care: Progressive:    as inpt    Dispo: The patient is from: Home              Anticipated d/c is to: Home              Anticipated d/c date is: 2 days              Patient currently is not medically stable to d/c.    Severity of Illness:  The appropriate patient status for this patient is INPATIENT. Inpatient status is judged to be reasonable and necessary in order to provide the required intensity of service to ensure the patient's safety. The patient's presenting symptoms, physical exam findings, and initial radiographic and laboratory data in the context of their chronic comorbidities is felt to place them at high risk for further clinical deterioration. Furthermore, it is not anticipated that the patient will be medically stable for discharge from the hospital within 2 midnights of admission.   * I certify that at the point of admission it is my clinical judgment that the patient will require inpatient hospital care spanning beyond 2 midnights from the point of admission due to high intensity of service, high risk for further deterioration and high frequency of surveillance required.*             Dispo: The patient is from: Home              Anticipated d/c is to: Home              Anticipated d/c date is: 1 day              Patient currently is not medically stable to d/c.    Severity of Illness:  The appropriate patient status for this patient is OBSERVATION. Observation status is judged to be reasonable and necessary in order to provide the required intensity of service to ensure the patient's safety. The patient's presenting symptoms, physical exam findings, and initial radiographic and laboratory data in the context of their medical condition is felt to place them at decreased risk for further clinical deterioration. Furthermore, it is anticipated that the patient will be medically stable for  discharge from the hospital within 2 midnights of admission.        Date of Service 10/15/2022    [facility]   If 7PM-7AM, please contact night-coverage [email or web address] 10/15/2022, 1:52 PM

## 2022-10-15 NOTE — ED Notes (Signed)
Delayed in administering Atorvastatin, Propranolol, Pantoprazole, and Folic acid due to them not being verified currently.

## 2022-10-15 NOTE — ED Notes (Addendum)
Pt taken off of Bipap and put on 2L [place] with confirmation from respiratory the VBG was perfect and to remove after approximately 1 hour and put pt on 2L [place]. Lactic lab was collected and sent down. Pt vitals currently stable.

## 2022-10-16 DIAGNOSIS — J441 Chronic obstructive pulmonary disease with (acute) exacerbation: Secondary | ICD-10-CM | POA: Diagnosis not present

## 2022-10-16 DIAGNOSIS — J101 Influenza due to other identified influenza virus with other respiratory manifestations: Secondary | ICD-10-CM | POA: Diagnosis not present

## 2022-10-16 DIAGNOSIS — E669 Obesity, unspecified: Secondary | ICD-10-CM | POA: Diagnosis not present

## 2022-10-16 DIAGNOSIS — R7989 Other specified abnormal findings of blood chemistry: Secondary | ICD-10-CM | POA: Diagnosis not present

## 2022-10-16 LAB — BLOOD GAS, VENOUS
Acid-base deficit: 0.7 mmol/L (ref 0.0–2.0)
Bicarbonate: 23.4 mmol/L (ref 20.0–28.0)
Delivery systems: POSITIVE
FIO2: 32 %
Mechanical Rate: 10
O2 Saturation: 84.1 %
Patient temperature: 37
pCO2, Ven: 36 mmHg — ABNORMAL LOW (ref 44–60)
pH, Ven: 7.42 (ref 7.25–7.43)
pO2, Ven: 52 mmHg — ABNORMAL HIGH (ref 32–45)

## 2022-10-16 LAB — COMPREHENSIVE METABOLIC PANEL WITH GFR
ALT: 58 U/L — ABNORMAL HIGH (ref 0–44)
AST: 89 U/L — ABNORMAL HIGH (ref 15–41)
Albumin: 3.8 g/dL (ref 3.5–5.0)
Alkaline Phosphatase: 49 U/L (ref 38–126)
Anion gap: 8 (ref 5–15)
BUN: 20 mg/dL (ref 6–20)
CO2: 23 mmol/L (ref 22–32)
Calcium: 8.5 mg/dL — ABNORMAL LOW (ref 8.9–10.3)
Chloride: 108 mmol/L (ref 98–111)
Creatinine, Ser: 0.71 mg/dL (ref 0.44–1.00)
GFR, Estimated: >60 mL/min
Glucose, Bld: 141 mg/dL — ABNORMAL HIGH (ref 70–99)
Potassium: 4.3 mmol/L (ref 3.5–5.1)
Sodium: 139 mmol/L (ref 135–145)
Total Bilirubin: 0.9 mg/dL (ref 0.3–1.2)
Total Protein: 6.5 g/dL (ref 6.5–8.1)

## 2022-10-16 LAB — HEMOGLOBIN A1C
Hgb A1c MFr Bld: 6.1 % — ABNORMAL HIGH (ref 4.8–5.6)
Mean Plasma Glucose: 128 mg/dL

## 2022-10-16 MED ORDER — IPRATROPIUM-ALBUTEROL 0.5-2.5 (3) MG/3ML IN SOLN
3.0000 mL | Freq: Four times a day (QID) | RESPIRATORY_TRACT | Status: DC
  Administered 2022-10-17: 3 mL via RESPIRATORY_TRACT
  Filled 2022-10-16: qty 3

## 2022-10-16 NOTE — Assessment & Plan Note (Signed)
No evidence of ACS.  More secondary to bronchospasm

## 2022-10-16 NOTE — Assessment & Plan Note (Signed)
Stable, continue home medications 

## 2022-10-16 NOTE — Progress Notes (Signed)
[facility] Progress Note  Patient: Lauren Escobar    LJQ:[id number]  DOA: 10/15/2022    Date of Service: the patient was seen and examined on 10/16/2022  Brief hospital course: 56 year old female with past medical history of hypertension, obesity, COPD, depression and polycythemia vera presented to the emergency room with shortness of breath x 1 day and pleuritic chest pain.  Patient found to have COPD exacerbation and influenza A.   Assessment and Plan: * Influenza A Tested positive.  Continue Tamiflu.  Sepsis ruled out.  COPD exacerbation (HCC) Mild flare.  Currently on room air.  Still slightly tachypneic.  Continue steroids and nebulizers.  No evidence of additional infection given normal procalcitonin.  Monitor overnight.  Chest pain No evidence of ACS.  More secondary to bronchospasm  HTN (hypertension) Blood pressure slightly low.  Hold home medications except for propranolol  Depression with anxiety Stable, continue home medications  Abnormal LFTs Recheck labs in the morning  Polycythemia vera (HCC) Stable  Tobacco abuse Ongoing.  Nicotine patch.  Von Willebrand's disease ([place]) Stable, no active bleeding  Obesity with body mass index (BMI) of 30.0 to 39.9 Meets criteria with BMI greater than 30       Body mass index is 31.09 kg/m.        Consultants: None  Procedures: None  Antimicrobials: Tamiflu 12/19-present  Code Status: Full code   Subjective: Patient doing okay, no complaints, breathing a little easier  Objective: Vital signs were reviewed and unremarkable. Vitals:   10/16/22 1229 10/16/22 1445  BP: 101/64 93/60  Pulse: 83 84  Resp: 20 20  Temp:  98.4 F (36.9 C)  SpO2: 97% 93%    Intake/Output Summary (Last 24 hours) at 10/16/2022 1658 Last data filed at 10/16/2022 0127 Gross per 24 hour  Intake 50 ml  Output --  Net 50 ml   Filed Weights   10/15/22 0522  Weight: 77.1 kg   Body mass index is 31.09  kg/m.  Exam:  General: Alert and oriented x 3, no acute distress HEENT: Normocephalic, atraumatic, mucous membranes are slightly dry Cardiovascular: Regular rate and rhythm, S1-S2 Respiratory: Decreased breath sounds throughout Abdomen: Soft, nontender, nondistended, positive bowel sounds Musculoskeletal: No clubbing or cyanosis or edema Skin: No skin breaks, tears or lesions Psychiatry: Slightly flattened affect, but otherwise appropriate, no evidence of psychoses Neurology: No focal deficits  Data Reviewed: Mildly elevated LFTs  Disposition:  Status is: Inpatient Remains inpatient appropriate because:  -Stabilization of breathing    Anticipated discharge date: 12/21  Family Communication: Declined for me to call any family DVT Prophylaxis: SCDs Start: 10/15/22 1036    Author: Annita Brod ,MD 10/16/2022 4:58 PM  To reach On-call, see care teams to locate the attending and reach out via [email or web address]. Between 7PM-7AM, please contact night-coverage If you still have difficulty reaching the attending provider, please page the [facility] (Director on Call) for [facility] on amion for assistance.

## 2022-10-16 NOTE — Assessment & Plan Note (Signed)
Ongoing.  Nicotine patch.

## 2022-10-16 NOTE — Assessment & Plan Note (Signed)
Stable, no active bleeding

## 2022-10-16 NOTE — Assessment & Plan Note (Signed)
Stable

## 2022-10-16 NOTE — ED Notes (Signed)
Patient ambulated at this time maintaining O2 98-99% on RA, patient dyspneic on exertion with RR 35-40 and with labored breathing. Patient states feels short of breath and this is not her baseline. MD and charge RN notified at this time.

## 2022-10-16 NOTE — Assessment & Plan Note (Signed)
Tested positive.  Sepsis ruled out.  Discharged on few more days of Tamiflu.

## 2022-10-16 NOTE — Progress Notes (Signed)
SLP Cancellation Note  Patient Details Name: Lauren Escobar MRN: [id number] DOB: 1966-01-07   Cancelled treatment:       Reason Eval/Treat Not Completed: SLP screened, no needs identified, will sign off  Lauren Escobar B. Rutherford Nail, M.S., CCC-SLP, [place] Pathologist Certified Brain Injury [place]  [place] Office [phone] Ascom [phone] Fax [phone]  Stormy Fabian 10/16/2022, 11:31 AM

## 2022-10-16 NOTE — Assessment & Plan Note (Signed)
Blood pressure slightly low.  Hold home medications except for propranolol, improved by time of discharge

## 2022-10-16 NOTE — Assessment & Plan Note (Signed)
Mild flare.  Improved and down to room air.  Patient discharged on steroids and nebulizers.  Advised patient to quit smoking.    No evidence of additional infection given normal procalcitonin.

## 2022-10-16 NOTE — Hospital Course (Signed)
56 year old female with past medical history of hypertension, obesity, COPD, depression and polycythemia vera presented to the emergency room with shortness of breath x 1 day and pleuritic chest pain.  Patient found to have COPD exacerbation and influenza A.

## 2022-10-16 NOTE — Assessment & Plan Note (Signed)
Recheck labs in the morning

## 2022-10-16 NOTE — Assessment & Plan Note (Signed)
Meets criteria with BMI greater than 30 ?

## 2022-10-17 DIAGNOSIS — F319 Bipolar disorder, unspecified: Secondary | ICD-10-CM | POA: Diagnosis present

## 2022-10-17 LAB — COMPREHENSIVE METABOLIC PANEL
ALT: 55 U/L — ABNORMAL HIGH (ref 0–44)
AST: 82 U/L — ABNORMAL HIGH (ref 15–41)
Albumin: 3.6 g/dL (ref 3.5–5.0)
Alkaline Phosphatase: 47 U/L (ref 38–126)
Anion gap: 7 (ref 5–15)
BUN: 17 mg/dL (ref 6–20)
CO2: 23 mmol/L (ref 22–32)
Calcium: 8.4 mg/dL — ABNORMAL LOW (ref 8.9–10.3)
Chloride: 109 mmol/L (ref 98–111)
Creatinine, Ser: 0.64 mg/dL (ref 0.44–1.00)
GFR, Estimated: >60 mL/min (ref >60)
Glucose, Bld: 121 mg/dL — ABNORMAL HIGH (ref 70–99)
Potassium: 4.1 mmol/L (ref 3.5–5.1)
Sodium: 139 mmol/L (ref 135–145)
Total Bilirubin: 0.8 mg/dL (ref 0.3–1.2)
Total Protein: 6.5 g/dL (ref 6.5–8.1)

## 2022-10-17 MED ORDER — PROMETHAZINE HCL 12.5 MG PO TABS: 12.5000 mg | ORAL_TABLET | Freq: Four times a day (QID) | ORAL | 0 refills | Status: DC | PRN

## 2022-10-17 MED ORDER — IPRATROPIUM-ALBUTEROL 0.5-2.5 (3) MG/3ML IN SOLN
3.0000 mL | Freq: Two times a day (BID) | RESPIRATORY_TRACT | Status: DC
  Administered 2022-10-17: 3 mL via RESPIRATORY_TRACT
  Filled 2022-10-17: qty 3

## 2022-10-17 MED ORDER — RISPERIDONE 1 MG PO TABS: ORAL_TABLET | ORAL | 0 refills | Status: DC

## 2022-10-17 MED ORDER — VRAYLAR 1.5 MG PO CAPS: 1.5000 mg | ORAL_CAPSULE | Freq: Every day | ORAL | 3 refills | Status: DC

## 2022-10-17 MED ORDER — REXULTI 0.5 MG PO TABS: ORAL_TABLET | ORAL | 1 refills | Status: DC

## 2022-10-17 MED ORDER — DM-GUAIFENESIN ER 30-600 MG PO TB12: 1.0000 | ORAL_TABLET | Freq: Two times a day (BID) | ORAL | 0 refills | Status: DC | PRN

## 2022-10-17 MED ORDER — NICOTINE 21 MG/24HR TD PT24: 21.0000 mg | MEDICATED_PATCH | Freq: Every day | TRANSDERMAL | 0 refills | Status: DC

## 2022-10-17 MED ORDER — PREDNISONE 10 MG PO TABS: ORAL_TABLET | ORAL | 0 refills | Status: AC

## 2022-10-17 MED ORDER — OSELTAMIVIR PHOSPHATE 75 MG PO CAPS: 75.0000 mg | ORAL_CAPSULE | Freq: Two times a day (BID) | ORAL | 0 refills | Status: DC

## 2022-10-17 MED ORDER — VENLAFAXINE HCL ER 150 MG PO CP24: 300.0000 mg | ORAL_CAPSULE | Freq: Every morning | ORAL | 3 refills | Status: DC

## 2022-10-17 NOTE — TOC Initial Note (Signed)
Transition of Care [facility]) - Initial/Assessment Note    Patient Details  Name: Lauren Escobar MRN: [id number] Date of Birth: Apr 13, 1966  Transition of Care [facility]) CM/SW Contact:    Laurena Slimmer, RN Phone Number: 10/17/2022, 2:33 PM  Clinical Narrative:                  Transition of Care [facility] Screening Note   Patient Details  Name: Lauren Escobar Date of Birth: 1966-06-01   Transition of Care [facility]) CM/SW Contact:    Laurena Slimmer, RN Phone Number: 10/17/2022, 2:33 PM    Transition of Care Department [facility]) has reviewed patient and no TOC needs have been identified at this time. We will continue to monitor patient advancement through interdisciplinary progression rounds. If new patient transition needs arise, please place a TOC consult.          Patient Goals and CMS Choice            Expected Discharge Plan and Services                                                Prior Living Arrangements/Services                       Activities of Daily Living Home Assistive Devices/Equipment: None ADL Screening (condition at time of admission) Patient's cognitive ability adequate to safely complete daily activities?: Yes Is the patient deaf or have difficulty hearing?: No Does the patient have difficulty seeing, even when wearing glasses/contacts?: No Does the patient have difficulty concentrating, remembering, or making decisions?: No Patient able to express need for assistance with ADLs?: Yes Does the patient have difficulty dressing or bathing?: No Independently performs ADLs?: Yes (appropriate for developmental age) Does the patient have difficulty walking or climbing stairs?: No Weakness of Legs: None Weakness of Arms/Hands: None  Permission Sought/Granted                  Emotional Assessment              Admission diagnosis:  Influenza A [J10.1] Chronic obstructive pulmonary disease with acute exacerbation ([place])  [J44.1] Patient Active Problem List   Diagnosis Date Noted   Influenza A 10/15/2022   COPD exacerbation ([place]) 10/15/2022   Abnormal LFTs 10/15/2022   HTN (hypertension) 10/15/2022   Depression with anxiety 10/15/2022   Obesity with body mass index (BMI) of 30.0 to 39.9 10/15/2022   Tobacco abuse 10/15/2022   CAD (coronary artery disease) 10/15/2022   Fracture of phalanx of finger 05/21/2019   Impingement syndrome of right shoulder region 08/28/2017   Pain in joint, shoulder region (secondary) (right greater than left) 04/16/2017   Knee pain (primary) (greater than right) 04/16/2017   Pain due to interstitial cystitis (tertiary) 04/16/2017   COPD (chronic obstructive pulmonary disease) ([place]) 04/15/2017   Depression 04/15/2017   GERD (gastroesophageal reflux disease) 04/15/2017   Migraines 04/15/2017   Chronic pain syndrome 04/15/2017   Long term current use of opiate analgesic 04/15/2017   Long term prescription opiate use 04/15/2017   Opiate use 04/15/2017   Urinary retention 02/05/2017   Bladder outlet obstruction 01/21/2017   Early satiety 12/19/2016   Epigastric pain 11/21/2016   Diarrhea, unspecified 11/21/2016   Dysuria 11/21/2016   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  Urinary tract infection 09/27/2016   Chest pain, unspecified 07/31/2016   S/P cardiac cath 07/31/2016   Unstable angina ([place]) 07/12/2016   Balance problem 12/27/2015   History of total knee arthroplasty 10/11/2015   Chronic hip pain (tertiary) (bilateral) (left greater than right) 08/09/2015   Abdominal pain, acute, right lower quadrant 05/18/2015   Polycythemia vera ([place]) 04/20/2015   Pain medication agreement signed 04/20/2015   Candidiasis of female genitalia 04/05/2015   Benign essential hypertension 03/31/2015   Mechanical breakdown of implanted electronic neurostimulator of peripheral nerve ([place]) 01/28/2015   Mixed hyperlipidemia 08/29/2014   Asthma without status  asthmaticus 07/28/2014   Detrusor instability 07/28/2014   Chest pain 07/15/2014   Chronic cough 07/15/2014   Chronic daily headache 07/15/2014   Congenital nystagmus 07/15/2014   Headache disorder 07/15/2014   Nystagmus, congenital 07/15/2014   Type I diabetes mellitus ([place]) 05/23/2014   Diabetic peripheral neuropathy ([place]) 06/25/2013   Chronic cystitis 03/25/2013   Atony of bladder 02/18/2013   Urinary incontinence without sensory awareness 02/18/2013   Female stress incontinence 01/20/2013   Functional disorder of bladder 01/20/2013   Urge incontinence 01/20/2013   Overactive detrusor 01/20/2013   Chronic interstitial cystitis 07/02/2012   Dyspareunia 07/02/2012   Gross hematuria 07/02/2012   Incomplete emptying of bladder 07/02/2012   Mixed urge and stress incontinence 07/02/2012   Increased frequency of urination 07/02/2012   Von Willebrand's disease ([place]) 07/02/2012   PCP:  Letta Median, MD Pharmacy:   [id number] [place] [facility] [address]Phone: [phone]: [phone] [id number] [id number]: [phone]: [phone]   Social Determinants of Health (SDOH) Social History: SDSumnerNo Food Insecurity (10/16/2022)  Housing: Low Risk  (10/16/2022)  Transportation Needs: No Transportation Needs (10/16/2022)  Utilities: Not At Risk (10/16/2022)  Depression (PHQ2-9): High Risk (09/30/2022)  Tobacco Use: Medium Risk (10/15/2022)   SDOH Interventions:     Readmission Risk Interventions     No data to display

## 2022-10-17 NOTE — Discharge Summary (Signed)
Physician Discharge Summary   Patient: Lauren Escobar MRN: [id number] DOB: 15-Mar-1966  Admit date:     10/15/2022  Discharge date: 10/17/22  Discharge Physician: Annita Brod   PCP: Letta Median, MD   Recommendations at discharge:   Patient will follow-up with her PCP in the next month Discharge medication: Prednisone taper Discharge medication: Tamiflu 75 p.o. twice daily x 3 more days  Discharge Diagnoses: Principal Problem:   Influenza A Active Problems:   COPD exacerbation ([place])   Chest pain   CAD (coronary artery disease)   HTN (hypertension)   Abnormal LFTs   Depression with anxiety   Polycythemia vera ([place])   Von Willebrand's disease ([place])   Tobacco abuse   Obesity with body mass index (BMI) of 30.0 to 39.9   Bipolar disorder, unspecified ([place])  Resolved Problems:   * No resolved hospital problems. *  Hospital Course: 56 year old female with past medical history of hypertension, obesity, COPD, depression and polycythemia vera presented to the emergency room with shortness of breath x 1 day and pleuritic chest pain.  Patient found to have COPD exacerbation and influenza A.  Assessment and Plan: * Influenza A Tested positive.  Sepsis ruled out.  Discharged on few more days of Tamiflu.  COPD exacerbation (HCC) Mild flare.  Improved and down to room air.  Patient discharged on steroids and nebulizers.  Advised patient to quit smoking.    No evidence of additional infection given normal procalcitonin.   Chest pain No evidence of ACS.  More secondary to bronchospasm  HTN (hypertension) Blood pressure slightly low.  Hold home medications except for propranolol, improved by time of discharge  Depression with anxiety Stable, continue home medications  Abnormal LFTs Recheck labs in the morning  Polycythemia vera (HCC) Stable  Tobacco abuse Ongoing.  Nicotine patch.  Von Willebrand's disease ([place]) Stable, no active bleeding  Obesity with  body mass index (BMI) of 30.0 to 39.9 Meets criteria with BMI greater than 30  Bipolar disorder, unspecified ([place]) Patient had been on a number of medications including Vraylar, Effexor, Risperdal and Rexulti she stated that she has not been taking these medications because she ran out her PCP would not refill her medications until she follows up.  Will give her immediate refills, but urged patient that she needs to follow-up with her PCP in the next 30 days.  She says she will do so.         Consultants: None Procedures performed: None Disposition: Home Diet recommendation:  Discharge Diet Orders (From admission, onward)     Start     Ordered   10/17/22 0000  Diet - low sodium heart healthy        10/17/22 1446           Cardiac diet DISCHARGE MEDICATION: Allergies as of 10/17/2022       Reactions   Aspirin    Pregabalin Other (See Comments)   Unsteady gate, causing falls   Trazodone Other (See Comments)   weakness   Tramadol Rash        Medication List     STOP taking these medications    dicyclomine 20 MG tablet Commonly known as: BENTYL       TAKE these medications    albuterol 108 (90 Base) MCG/ACT inhaler Commonly known as: VENTOLIN HFA Inhale into the lungs.   aspirin EC 81 MG tablet Take by mouth.   atorvastatin 20 MG tablet Commonly known as: LIPITOR TAKE 1  TABLET BY MOUTH AT BEDTIME FOR HIGH CHOLESTEROL   cetirizine 10 MG tablet Commonly known as: ZYRTEC TAKE 1 TABLET BY MOUTH DAILY FOR ALLERGIES   dextromethorphan-guaiFENesin 30-600 MG 12hr tablet Commonly known as: MUCINEX DM Take 1 tablet by mouth 2 (two) times daily as needed for cough.   ferrous sulfate 325 (65 FE) MG tablet Take by mouth.   fluticasone furoate-vilanterol 100-25 MCG/ACT Aepb Commonly known as: Breo Ellipta Inhale 1 puff into the lungs daily.   folic acid 1 MG tablet Commonly known as: FOLVITE Take 1 mg by mouth daily.   isosorbide mononitrate 30 MG 24  hr tablet Commonly known as: IMDUR Take 30 mg by mouth daily.   MULTIVIT/MULTIMINERAL ADULT PO Take by mouth.   NexIUM 40 MG capsule Generic drug: esomeprazole Take 40 mg by mouth daily.   nicotine 21 mg/24hr patch Commonly known as: NICODERM CQ - dosed in mg/24 hours Place 1 patch (21 mg total) onto the skin daily.   NITROGLYCERIN SL Place 1 tablet under the tongue.   oseltamivir 75 MG capsule Commonly known as: TAMIFLU Take 1 capsule (75 mg total) by mouth 2 (two) times daily.   predniSONE 10 MG tablet Commonly known as: DELTASONE Take 4 tablets (40 mg total) by mouth daily with breakfast for 1 day, THEN 3 tablets (30 mg total) daily with breakfast for 1 day, THEN 2 tablets (20 mg total) daily with breakfast for 1 day, THEN 1 tablet (10 mg total) daily with breakfast for 1 day. Start taking on: October 18, 2022   propranolol 40 MG tablet Commonly known as: INDERAL TAKE 1 TABLET TWICE DAILY TO PREVENT HEADACHES   Rexulti 0.5 MG Tabs Generic drug: Brexpiprazole TAKE 1 TABLET BY MOUTH AT BEDTIME FOR 1 WEEK THEN INCREASE TO 1 MG TABLET   risperiDONE 1 MG tablet Commonly known as: RISPERDAL Take 0.5 tablets (0.5 mg total) by mouth at bedtime for 7 days, THEN 1 tablet (1 mg total) at bedtime. Start taking on: October 17, 2022 What changed:  medication strength See the new instructions.   venlafaxine XR 150 MG 24 hr capsule Commonly known as: EFFEXOR-XR Take 2 capsules (300 mg total) by mouth every morning.   Vraylar 1.5 MG capsule Generic drug: cariprazine Take 1 capsule (1.5 mg total) by mouth daily. What changed: when to take this        Discharge Exam: Filed Weights   10/15/22 0522  Weight: 77.1 kg   General: Alert and oriented x 3 Cardiovascular: Regular rate and rhythm, S1-S2 Lungs: Clear to auscultation bilaterally, no wheezing  Condition at discharge: good  The results of significant diagnostics from this hospitalization (including imaging,  microbiology, ancillary and laboratory) are listed below for reference.   Imaging Studies: CT Angio Chest Pulmonary Embolism (PE) W or WO Contrast  Result Date: 10/15/2022 CLINICAL DATA:  Short of breath, chest pain and intermittent fever since yesterday EXAM: CT ANGIOGRAPHY CHEST WITH CONTRAST TECHNIQUE: Multidetector CT imaging of the chest was performed using the standard protocol during bolus administration of intravenous contrast. Multiplanar CT image reconstructions and MIPs were obtained to evaluate the vascular anatomy. RADIATION DOSE REDUCTION: This exam was performed according to the departmental dose-optimization program which includes automated exposure control, adjustment of the mA and/or kV according to patient size and/or use of iterative reconstruction technique. CONTRAST:  70m OMNIPAQUE IOHEXOL 350 MG/ML SOLN COMPARISON:  10/15/2022, 12/16/2020 FINDINGS: Cardiovascular: This is a technically adequate evaluation of the pulmonary vasculature. No filling defects or pulmonary  emboli. The heart is unremarkable without pericardial effusion. No evidence of thoracic aortic aneurysm or dissection. Atherosclerosis of the aortic arch. Mediastinum/Nodes: No enlarged mediastinal, hilar, or axillary lymph nodes. Thyroid gland, trachea, and esophagus demonstrate no significant findings. Lungs/Pleura: No acute airspace disease, effusion, or pneumothorax. Central airways are patent. Upper Abdomen: No acute abnormality.  Hepatic steatosis. Musculoskeletal: No acute or destructive bony lesions. Reconstructed images demonstrate no additional findings. Review of the MIP images confirms the above findings. IMPRESSION: 1. No evidence of pulmonary embolus. 2. No acute intrathoracic process. 3. Hepatic steatosis. 4.  Aortic Atherosclerosis (ICD10-I70.0). Electronically Signed   By: Randa Ngo M.D.   On: 10/15/2022 11:36   DG Chest Portable 1 View  Result Date: 10/15/2022 CLINICAL DATA:  Chest pain and  shortness of breath. Intermittent fever. EXAM: PORTABLE CHEST 1 VIEW COMPARISON:  CT 09/12/2022 FINDINGS: Heart size and mediastinal contours are unremarkable. Central airway thickening with peribronchial cuffing. No pleural effusion or edema. No airspace opacities. Visualized osseous structures are unremarkable. IMPRESSION: Central airway thickening with peribronchial cuffing compatible with bronchitis or reactive airways disease. Electronically Signed   By: Kerby Moors M.D.   On: 10/15/2022 05:59    Microbiology: Results for orders placed or performed during the hospital encounter of 10/15/22  Resp panel by RT-PCR (RSV, Flu A&B, Covid) Anterior Nasal Swab     Status: Abnormal   Collection Time: 10/15/22  5:25 AM   Specimen: Anterior Nasal Swab  Result Value Ref Range Status   SARS Coronavirus 2 by RT PCR NEGATIVE NEGATIVE Final    Comment: (NOTE) SARS-CoV-2 target nucleic acids are NOT DETECTED.  The SARS-CoV-2 RNA is generally detectable in upper respiratory specimens during the acute phase of infection. The lowest concentration of SARS-CoV-2 viral copies this assay can detect is 138 copies/mL. A negative result does not preclude SARS-Cov-2 infection and should not be used as the sole basis for treatment or other patient management decisions. A negative result may occur with  improper specimen collection/handling, submission of specimen other than nasopharyngeal swab, presence of viral mutation(s) within the areas targeted by this assay, and inadequate number of viral copies(<138 copies/mL). A negative result must be combined with clinical observations, patient history, and epidemiological information. The expected result is Negative.  Fact Sheet for Patients:  EntrepreneurPulse.com.au  Fact Sheet for Healthcare Providers:  IncredibleEmployment.be  This test is no t yet approved or cleared by the [place] FDA and  has been authorized for  detection and/or diagnosis of SARS-CoV-2 by FDA under an Emergency Use Authorization (EUA). This EUA will remain  in effect (meaning this test can be used) for the duration of the COVID-19 declaration under Section 564(b)(1) of the Act, 21 U.S.C.section 360bbb-3(b)(1), unless the authorization is terminated  or revoked sooner.       Influenza A by PCR POSITIVE (A) NEGATIVE Final   Influenza B by PCR NEGATIVE NEGATIVE Final    Comment: (NOTE) The Xpert Xpress SARS-CoV-2/FLU/RSV plus assay is intended as an aid in the diagnosis of influenza from Nasopharyngeal swab specimens and should not be used as a sole basis for treatment. Nasal washings and aspirates are unacceptable for Xpert Xpress SARS-CoV-2/FLU/RSV testing.  Fact Sheet for Patients: EntrepreneurPulse.com.au  Fact Sheet for Healthcare Providers: IncredibleEmployment.be  This test is not yet approved or cleared by the [place] FDA and has been authorized for detection and/or diagnosis of SARS-CoV-2 by FDA under an Emergency Use Authorization (EUA). This EUA will remain in effect (meaning this test  can be used) for the duration of the COVID-19 declaration under Section 564(b)(1) of the Act, 21 U.S.C. section 360bbb-3(b)(1), unless the authorization is terminated or revoked.     Resp Syncytial Virus by PCR NEGATIVE NEGATIVE Final    Comment: (NOTE) Fact Sheet for Patients: EntrepreneurPulse.com.au  Fact Sheet for Healthcare Providers: IncredibleEmployment.be  This test is not yet approved or cleared by the [place] FDA and has been authorized for detection and/or diagnosis of SARS-CoV-2 by FDA under an Emergency Use Authorization (EUA). This EUA will remain in effect (meaning this test can be used) for the duration of the COVID-19 declaration under Section 564(b)(1) of the Act, 21 U.S.C. section 360bbb-3(b)(1), unless the authorization is  terminated or revoked.  Performed at [facility], [address] [place], [place] [address]     Labs: CBC: Recent Labs  Lab 10/15/22 0531  WBC 9.4  NEUTROABS 8.1*  HGB 13.8  HCT 41.3  MCV 83.1  PLT 229   Basic Metabolic Panel: Recent Labs  Lab 10/15/22 0531 10/16/22 1505 10/17/22 0548  NA 139 139 139  K 4.5 4.3 4.1  CL 106 108 109  CO2 '26 23 23  '$ GLUCOSE 114* 141* 121*  BUN '9 20 17  '$ CREATININE 0.70 0.71 0.64  CALCIUM 9.1 8.5* 8.4*   Liver Function Tests: Recent Labs  Lab 10/15/22 0531 10/16/22 1505 10/17/22 0548  AST 89* 89* 82*  ALT 62* 58* 55*  ALKPHOS 65 49 47  BILITOT 1.2 0.9 0.8  PROT 7.6 6.5 6.5  ALBUMIN 4.6 3.8 3.6   CBG: No results for input(s): "GLUCAP" in the last 168 hours.  Discharge time spent: less than 30 minutes.  Signed: Annita Brod, MD [facility] 10/17/2022

## 2022-10-17 NOTE — Assessment & Plan Note (Signed)
Patient had been on a number of medications including Vraylar, Effexor, Risperdal and Rexulti she stated that she has not been taking these medications because she ran out her PCP would not refill her medications until she follows up.  Will give her immediate refills, but urged patient that she needs to follow-up with her PCP in the next 30 days.  She says she will do so.

## 2022-10-23 ENCOUNTER — Encounter: Payer: Medicare Other | Admitting: *Deleted

## 2022-10-23 ENCOUNTER — Encounter: Payer: Self-pay | Admitting: *Deleted

## 2022-10-23 DIAGNOSIS — R0602 Shortness of breath: Secondary | ICD-10-CM

## 2022-10-23 DIAGNOSIS — J432 Centrilobular emphysema: Secondary | ICD-10-CM

## 2022-10-23 NOTE — Progress Notes (Signed)
Pulmonary Individual Treatment Plan  Patient Details  Name: YOCHEVED DEPNER MRN: [id number] Date of Birth: 03-22-1966 Referring Provider:   Flowsheet Row Pulmonary Rehab from 09/30/2022 in [facility] Cardiac and Pulmonary Rehab  Referring Provider Armando Reichert MD       Initial Encounter Date:  Flowsheet Row Pulmonary Rehab from 09/30/2022 in [facility] Cardiac and Pulmonary Rehab  Date 09/30/22       Visit Diagnosis: Centrilobular emphysema ([place])  Shortness of breath  Patient's Home Medications on Admission:  Current Outpatient Medications:    albuterol (VENTOLIN HFA) 108 (90 Base) MCG/ACT inhaler, Inhale into the lungs., Disp: , Rfl:    aspirin EC 81 MG tablet, Take by mouth., Disp: , Rfl:    atorvastatin (LIPITOR) 20 MG tablet, TAKE 1 TABLET BY MOUTH AT BEDTIME FOR HIGH CHOLESTEROL, Disp: , Rfl:    cetirizine (ZYRTEC) 10 MG tablet, TAKE 1 TABLET BY MOUTH DAILY FOR ALLERGIES, Disp: , Rfl:    dextromethorphan-guaiFENesin (MUCINEX DM) 30-600 MG 12hr tablet, Take 1 tablet by mouth 2 (two) times daily as needed for cough., Disp: 14 tablet, Rfl: 0   esomeprazole (NEXIUM) 40 MG capsule, Take 40 mg by mouth daily. , Disp: , Rfl:    ferrous sulfate 325 (65 FE) MG tablet, Take by mouth., Disp: , Rfl:    fluticasone furoate-vilanterol (BREO ELLIPTA) 100-25 MCG/ACT AEPB, Inhale 1 puff into the lungs daily., Disp: 30 each, Rfl: 11   folic acid (FOLVITE) 1 MG tablet, Take 1 mg by mouth daily., Disp: , Rfl:    isosorbide mononitrate (IMDUR) 30 MG 24 hr tablet, Take 30 mg by mouth daily., Disp: , Rfl:    Multiple Vitamins-Minerals (MULTIVIT/MULTIMINERAL ADULT PO), Take by mouth., Disp: , Rfl:    nicotine (NICODERM CQ - DOSED IN MG/24 HOURS) 21 mg/24hr patch, Place 1 patch (21 mg total) onto the skin daily., Disp: 28 patch, Rfl: 0   NITROGLYCERIN SL, Place 1 tablet under the tongue. (Patient not taking: Reported on 09/11/2022), Disp: , Rfl:    oseltamivir (TAMIFLU) 75 MG capsule, Take 1 capsule (75 mg  total) by mouth 2 (two) times daily., Disp: 5 capsule, Rfl: 0   promethazine (PHENERGAN) 12.5 MG tablet, Take 1 tablet (12.5 mg total) by mouth every 6 (six) hours as needed for nausea or vomiting., Disp: 20 tablet, Rfl: 0   propranolol (INDERAL) 40 MG tablet, TAKE 1 TABLET TWICE DAILY TO PREVENT HEADACHES, Disp: , Rfl:    REXULTI 0.5 MG TABS, TAKE 1 TABLET BY MOUTH AT BEDTIME FOR 1 WEEK THEN INCREASE TO 1 MG TABLET, Disp: 67 tablet, Rfl: 1   risperiDONE (RISPERDAL) 1 MG tablet, Take 0.5 tablets (0.5 mg total) by mouth at bedtime for 7 days, THEN 1 tablet (1 mg total) at bedtime., Disp: 33.5 tablet, Rfl: 0   venlafaxine XR (EFFEXOR-XR) 150 MG 24 hr capsule, Take 2 capsules (300 mg total) by mouth every morning., Disp: 30 capsule, Rfl: 3   VRAYLAR 1.5 MG capsule, Take 1 capsule (1.5 mg total) by mouth daily., Disp: 30 capsule, Rfl: 3  Past Medical History: Past Medical History:  Diagnosis Date   Abdominal pain    Abnormal mammogram    Anxiety    Asthma    Bleeding disorder ([place])    Blood dyscrasia    polycythemia vera   Chronic kidney disease    COPD (chronic obstructive pulmonary disease) ([facility])    Cough    Depression    Diabetes mellitus without complication ([place])  Fatigue    Headache 2000   Horizontal nystagmus    age 56   Hyperglycemia    Hypertension    Low back pain    Major depressive disorder    Nasal lesion    Neck pain    Snoring    SOB (shortness of breath)    Tobacco use    UTI (urinary tract infection)    Von Willebrand's disease ([place])     Tobacco Use: Social History   Tobacco Use  Smoking Status Former   Packs/day: 2.75   Years: 29.00   Total pack years: 79.75   Types: Cigarettes   Quit date: 10/2020   Years since quitting: 1.9  Smokeless Tobacco Never    Labs: Review Flowsheet       Latest Ref Rng & Units 04/06/2013 01/01/2014 06/16/2017 10/15/2022  Labs for ITP Cardiac and Pulmonary Rehab  Cholestrol 0 - 200 mg/dL 119  109  - 175   LDL (calc) 0  - 99 mg/dL 34  40  - 92   HDL-C >40 mg/dL 32  34  - 49   Trlycerides <150 mg/dL 267  175  - 172   Hemoglobin A1c 4.8 - 5.6 % 5.2  5.5  5.5  6.1   Bicarbonate 20.0 - 28.0 mmol/L - - - 23.4   Acid-base deficit 0.0 - 2.0 mmol/L - - - 0.7   O2 Saturation % - - - 84.1      Pulmonary Assessment Scores:  Pulmonary Assessment Scores     Row Name 09/30/22 1109         ADL UCSD   ADL Phase Entry     SOB Score total 62     Rest 1     Walk 2     Stairs 5     Bath 4     Dress 2     Shop 3       CAT Score   CAT Score 20       mMRC Score   mMRC Score 3              UCSD: Self-administered rating of dyspnea associated with activities of daily living (ADLs) 6-point scale (0 = "not at all" to 5 = "maximal or unable to do because of breathlessness")  Scoring Scores range from 0 to 120.  Minimally important difference is 5 units  CAT: CAT can identify the health impairment of COPD patients and is better correlated with disease progression.  CAT has a scoring range of zero to 40. The CAT score is classified into four groups of low (less than 10), medium (10 - 20), high (21-30) and very high (31-40) based on the impact level of disease on health status. A CAT score over 10 suggests significant symptoms.  A worsening CAT score could be explained by an exacerbation, poor medication adherence, poor inhaler technique, or progression of COPD or comorbid conditions.  CAT MCID is 2 points  mMRC: mMRC (Modified Medical Research Council) Dyspnea Scale is used to assess the degree of baseline functional disability in patients of respiratory disease due to dyspnea. No minimal important difference is established. A decrease in score of 1 point or greater is considered a positive change.   Pulmonary Function Assessment:  Pulmonary Function Assessment - 09/30/22 1109       Breath   Shortness of Breath Yes;Fear of Shortness of Breath;Limiting activity;Panic with Shortness of Breath  Exercise Target Goals: Exercise Program Goal: Individual exercise prescription set using results from initial 6 min walk test and THRR while considering  patient's activity barriers and safety.   Exercise Prescription Goal: Initial exercise prescription builds to 30-45 minutes a day of aerobic activity, 2-3 days per week.  Home exercise guidelines will be given to patient during program as part of exercise prescription that the participant will acknowledge.  Education: Aerobic Exercise: - Group verbal and visual presentation on the components of exercise prescription. Introduces F.I.T.T principle from ACSM for exercise prescriptions.  Reviews F.I.T.T. principles of aerobic exercise including progression. Written material given at graduation.   Education: Resistance Exercise: - Group verbal and visual presentation on the components of exercise prescription. Introduces F.I.T.T principle from ACSM for exercise prescriptions  Reviews F.I.T.T. principles of resistance exercise including progression. Written material given at graduation.    Education: Exercise & Equipment Safety: - Individual verbal instruction and demonstration of equipment use and safety with use of the equipment. Flowsheet Row Pulmonary Rehab from 10/09/2022 in [facility] Cardiac and Pulmonary Rehab  Date 09/30/22  Educator [facility]  Instruction Review Code 1- Verbalizes Understanding       Education: Exercise Physiology & General Exercise Guidelines: - Group verbal and written instruction with models to review the exercise physiology of the cardiovascular system and associated critical values. Provides general exercise guidelines with specific guidelines to those with heart or lung disease.    Education: Flexibility, Balance, Mind/Body Relaxation: - Group verbal and visual presentation with interactive activity on the components of exercise prescription. Introduces F.I.T.T principle from ACSM for exercise prescriptions. Reviews  F.I.T.T. principles of flexibility and balance exercise training including progression. Also discusses the mind body connection.  Reviews various relaxation techniques to help reduce and manage stress (i.e. Deep breathing, progressive muscle relaxation, and visualization). Balance handout provided to take home. Written material given at graduation.   Activity Barriers & Risk Stratification:  Activity Barriers & Cardiac Risk Stratification - 09/30/22 1100       Activity Barriers & Cardiac Risk Stratification   Activity Barriers Left Knee Replacement;Shortness of Breath;Balance Concerns;History of Falls;Muscular Weakness;Deconditioning;Back Problems   occasional back pain            6 Minute Walk:  6 Minute Walk     Row Name 09/30/22 1058         6 Minute Walk   Phase Initial     Distance 1315 feet     Walk Time 6 minutes     # of Rest Breaks 0     MPH 2.49     METS 3.55     RPE 15     Perceived Dyspnea  3     VO2 Peak 12.42     Symptoms Yes (comment)     Comments SOB, Fatigue     Resting HR 83 bpm     Resting BP 124/66     Resting Oxygen Saturation  96 %     Exercise Oxygen Saturation  during 6 min walk 95 %     Max Ex. HR 106 bpm     Max Ex. BP 146/74     2 Minute Post BP 136/72       Interval HR   1 Minute HR 104     2 Minute HR 104     3 Minute HR 103     4 Minute HR 106     5 Minute HR 101     6 Minute  HR 101     2 Minute Post HR 82     Interval Heart Rate? Yes       Interval Oxygen   Interval Oxygen? Yes     Baseline Oxygen Saturation % 96 %     1 Minute Oxygen Saturation % 95 %     1 Minute Liters of Oxygen 0 L  Room Air     2 Minute Oxygen Saturation % 96 %     2 Minute Liters of Oxygen 0 L     3 Minute Oxygen Saturation % 96 %     3 Minute Liters of Oxygen 0 L     4 Minute Oxygen Saturation % 96 %     4 Minute Liters of Oxygen 0 L     5 Minute Oxygen Saturation % 95 %     5 Minute Liters of Oxygen 0 L     6 Minute Oxygen Saturation % 96 %      6 Minute Liters of Oxygen 0 L     2 Minute Post Oxygen Saturation % 96 %     2 Minute Post Liters of Oxygen 0 L             Oxygen Initial Assessment:  Oxygen Initial Assessment - 09/30/22 1109       Home Oxygen   Home Oxygen Device None    Sleep Oxygen Prescription None    Home Exercise Oxygen Prescription None    Home Resting Oxygen Prescription None      Initial 6 min Walk   Oxygen Used None      Program Oxygen Prescription   Program Oxygen Prescription None      Intervention   Short Term Goals To learn and demonstrate proper pursed lip breathing techniques or other breathing techniques. ;To learn and understand importance of maintaining oxygen saturations>88%;To learn and demonstrate proper use of respiratory medications;To learn and understand importance of monitoring SPO2 with pulse oximeter and demonstrate accurate use of the pulse oximeter.    Long  Term Goals Maintenance of O2 saturations>88%;Exhibits proper breathing techniques, such as pursed lip breathing or other method taught during program session;Compliance with respiratory medication;Demonstrates proper use of MDI's;Verbalizes importance of monitoring SPO2 with pulse oximeter and return demonstration             Oxygen Re-Evaluation:  Oxygen Re-Evaluation     Row Name 10/07/22 0956             Goals/Expected Outcomes   Comments Reviewed PLB technique with pt.  Talked about how it works and it's importance in maintaining their exercise saturations.       Goals/Expected Outcomes Short: Become more profiecient at using PLB. Long: Become independent at using PLB.                Oxygen Discharge (Final Oxygen Re-Evaluation):  Oxygen Re-Evaluation - 10/07/22 0956       Goals/Expected Outcomes   Comments Reviewed PLB technique with pt.  Talked about how it works and it's importance in maintaining their exercise saturations.    Goals/Expected Outcomes Short: Become more profiecient at using PLB.  Long: Become independent at using PLB.             Initial Exercise Prescription:  Initial Exercise Prescription - 09/30/22 1100       Date of Initial Exercise RX and Referring Provider   Date 09/30/22    Referring Provider Armando Reichert MD  NuStep   Level 3    SPM 80    Minutes 15    METs 2      Arm Ergometer   Level 1    RPM 25    Minutes 15    METs 2      Biostep-RELP   Level 2    SPM 50    Minutes 15    METs 2      Track   Laps 34    Minutes 15    METs 2.85      Prescription Details   Frequency (times per week) 3    Duration Progress to 30 minutes of continuous aerobic without signs/symptoms of physical distress      Intensity   THRR 40-80% of Max Heartrate 115-147    Ratings of Perceived Exertion 11-13    Perceived Dyspnea 0-4      Progression   Progression Continue to progress workloads to maintain intensity without signs/symptoms of physical distress.      Resistance Training   Training Prescription Yes    Weight 3 lb    Reps 10-15             Perform Capillary Blood Glucose checks as needed.  Exercise Prescription Changes:   Exercise Prescription Changes     Row Name 09/30/22 1100 10/22/22 1400           Response to Exercise   Blood Pressure (Admit) 124/66 116/70      Blood Pressure (Exercise) 146/74 134/72      Blood Pressure (Exit) 126/62 114/66      Heart Rate (Admit) 83 bpm 84 bpm      Heart Rate (Exercise) 106 bpm 102 bpm      Heart Rate (Exit) 77 bpm 93 bpm      Oxygen Saturation (Admit) 96 % 99 %      Oxygen Saturation (Exercise) 95 % 95 %      Oxygen Saturation (Exit) 98 % 98 %      Rating of Perceived Exertion (Exercise) 15 15      Perceived Dyspnea (Exercise) 3 3      Symptoms SOB, fatigue SOB      Comments walk test results First 2 weeks of exercise      Duration -- Continue with 30 min of aerobic exercise without signs/symptoms of physical distress.      Intensity -- THRR unchanged        Progression    Progression -- Continue to progress workloads to maintain intensity without signs/symptoms of physical distress.      Average METs -- 2        Resistance Training   Training Prescription -- Yes      Weight -- 3 lb      Reps -- 10-15        Interval Training   Interval Training -- No        NuStep   Level -- 3      Minutes -- 15      METs -- 2        Arm Ergometer   Level -- 1      Minutes -- 15      METs -- 2        Biostep-RELP   Level -- 3      Minutes -- 15      METs -- 2        Track   Laps -- 32  Minutes -- 15      METs -- 2.74        Oxygen   Maintain Oxygen Saturation -- 88% or higher               Exercise Comments:   Exercise Comments     Row Name 10/07/22 0955           Exercise Comments First full day of exercise!  Patient was oriented to gym and equipment including functions, settings, policies, and procedures.  Patient's individual exercise prescription and treatment plan were reviewed.  All starting workloads were established based on the results of the 6 minute walk test done at initial orientation visit.  The plan for exercise progression was also introduced and progression will be customized based on patient's performance and goals.                Exercise Goals and Review:   Exercise Goals     Row Name 09/30/22 1102             Exercise Goals   Increase Physical Activity Yes       Intervention Provide advice, education, support and counseling about physical activity/exercise needs.;Develop an individualized exercise prescription for aerobic and resistive training based on initial evaluation findings, risk stratification, comorbidities and participant's personal goals.       Expected Outcomes Long Term: Exercising regularly at least 3-5 days a week.;Long Term: Add in home exercise to make exercise part of routine and to increase amount of physical activity.;Short Term: Attend rehab on a regular basis to increase amount of  physical activity.       Increase Strength and Stamina Yes       Intervention Provide advice, education, support and counseling about physical activity/exercise needs.;Develop an individualized exercise prescription for aerobic and resistive training based on initial evaluation findings, risk stratification, comorbidities and participant's personal goals.       Expected Outcomes Short Term: Increase workloads from initial exercise prescription for resistance, speed, and METs.;Short Term: Perform resistance training exercises routinely during rehab and add in resistance training at home;Long Term: Improve cardiorespiratory fitness, muscular endurance and strength as measured by increased METs and functional capacity (6MWT)       Able to understand and use rate of perceived exertion (RPE) scale Yes       Intervention Provide education and explanation on how to use RPE scale       Expected Outcomes Short Term: Able to use RPE daily in rehab to express subjective intensity level;Long Term:  Able to use RPE to guide intensity level when exercising independently       Able to understand and use Dyspnea scale Yes       Intervention Provide education and explanation on how to use Dyspnea scale       Expected Outcomes Short Term: Able to use Dyspnea scale daily in rehab to express subjective sense of shortness of breath during exertion;Long Term: Able to use Dyspnea scale to guide intensity level when exercising independently       Knowledge and understanding of Target Heart Rate Range (THRR) Yes       Intervention Provide education and explanation of THRR including how the numbers were predicted and where they are located for reference       Expected Outcomes Short Term: Able to state/look up THRR;Short Term: Able to use daily as guideline for intensity in rehab;Long Term: Able to use THRR to govern intensity when exercising independently  Able to check pulse independently Yes       Intervention Provide  education and demonstration on how to check pulse in carotid and radial arteries.;Review the importance of being able to check your own pulse for safety during independent exercise       Expected Outcomes Short Term: Able to explain why pulse checking is important during independent exercise;Long Term: Able to check pulse independently and accurately       Understanding of Exercise Prescription Yes       Intervention Provide education, explanation, and written materials on patient's individual exercise prescription       Expected Outcomes Short Term: Able to explain program exercise prescription;Long Term: Able to explain home exercise prescription to exercise independently                Exercise Goals Re-Evaluation :  Exercise Goals Re-Evaluation     Row Name 10/07/22 0955 10/22/22 1426           Exercise Goal Re-Evaluation   Exercise Goals Review Able to understand and use rate of perceived exertion (RPE) scale;Able to understand and use Dyspnea scale;Knowledge and understanding of Target Heart Rate Range (THRR);Understanding of Exercise Prescription Increase Physical Activity;Increase Strength and Stamina;Understanding of Exercise Prescription      Comments Reviewed RPE scale, THR and program prescription with pt today.  Pt voiced understanding and was given a copy of goals to take home. Hanaan is off to a good start in rehab. She was able to work at an average MET level of 2 METs during her first two weeks in rehab. She also was able to walk up to 32 laps on the track. She was able to follow her initial exercise prescription on the seated machines as well. We will continue to monitor her progress in the program.      Expected Outcomes Short: Use RPE daily to regulate intensity. Long: Follow program prescription in THR. Short: Continue to follow current exercise prescription. Long: Continue to improve strength and stamina.               Discharge Exercise Prescription (Final  Exercise Prescription Changes):  Exercise Prescription Changes - 10/22/22 1400       Response to Exercise   Blood Pressure (Admit) 116/70    Blood Pressure (Exercise) 134/72    Blood Pressure (Exit) 114/66    Heart Rate (Admit) 84 bpm    Heart Rate (Exercise) 102 bpm    Heart Rate (Exit) 93 bpm    Oxygen Saturation (Admit) 99 %    Oxygen Saturation (Exercise) 95 %    Oxygen Saturation (Exit) 98 %    Rating of Perceived Exertion (Exercise) 15    Perceived Dyspnea (Exercise) 3    Symptoms SOB    Comments First 2 weeks of exercise    Duration Continue with 30 min of aerobic exercise without signs/symptoms of physical distress.    Intensity THRR unchanged      Progression   Progression Continue to progress workloads to maintain intensity without signs/symptoms of physical distress.    Average METs 2      Resistance Training   Training Prescription Yes    Weight 3 lb    Reps 10-15      Interval Training   Interval Training No      NuStep   Level 3    Minutes 15    METs 2      Arm Ergometer   Level 1  Minutes 15    METs 2      Biostep-RELP   Level 3    Minutes 15    METs 2      Track   Laps 32    Minutes 15    METs 2.74      Oxygen   Maintain Oxygen Saturation 88% or higher             Nutrition:  Target Goals: Understanding of nutrition guidelines, daily intake of sodium <1567m, cholesterol <2067m calories 30% from fat and 7% or less from saturated fats, daily to have 5 or more servings of fruits and vegetables.  Education: All About Nutrition: -Group instruction provided by verbal, written material, interactive activities, discussions, models, and posters to present general guidelines for heart healthy nutrition including fat, fiber, MyPlate, the role of sodium in heart healthy nutrition, utilization of the nutrition label, and utilization of this knowledge for meal planning. Follow up email sent as well. Written material given at  graduation.   Biometrics:  Pre Biometrics - 09/30/22 1104       Pre Biometrics   Height 5' 2.1" (1.577 m)    Weight 169 lb (76.7 kg)    Waist Circumference 38 inches    Hip Circumference 40 inches    Waist to Hip Ratio 0.95 %    BMI (Calculated) 30.82    Single Leg Stand 0.9 seconds              Nutrition Therapy Plan and Nutrition Goals:  Nutrition Therapy & Goals - 09/30/22 0950       Nutrition Therapy   Diet Heart healthy, low Na, pulmonary and T2DM MNT    Protein (specify units) 90-100g    Fiber 25 grams    Whole Grain Foods 3 servings    Saturated Fats 14 max. grams    Fruits and Vegetables 8 servings/day    Sodium 2 grams      Personal Nutrition Goals   Nutrition Goal ST: include protein, fiber, and healthy fats when eating. Consider adding in nutrition shake such as ensure or boost. Add at least 2 meals/snacks LT: meet energy and protein needs, include at least 25g of fiber per day, limit Na <2g/day, limit saturated fat <14g/day, maintain A1C <7.    Comments [age].o. F admitted to pulmonary rehab for centrilobular emphysema. PMHx includes HTN, HLD, T2DM, arthritis, asthma, ataxia, COPD, depression, migraines, GERD, early satiety (2018). Relevant medications includes valium, nexium, prednisone, xanax, vyvanse, breo ellipta. BoLelianaoes not have teeth or dentures at this time and reports that she is unable to get them as she went too long without them - she reports being able to eat most foods, reviewed considerations to help with eating and swallowing. BoLaurelaieports having a very low appetite and forces herself to eat 1 small meal (about a fistfull of food). She reports that she is unsure why, but her weight has been trending upward: discussed body compensatory mechanisms with low intake of food as well as fluid retention. BoBedieeports drinking coffee with french vanilla creamer and regular mountain dew during the day, she does not like diet soda and feels that water  makes her bloat - discussed nutrition considerations with sugar sweetened beverages. BoCherikaeports that this is new to her and doesn't have much knowledge on nutrition - discussed heart healthy eating, pulmonary MNT, and T2DM MNT; discussed while MyPlate guidelines may be hard with her small portions, that she should include fiber, healthy  fat, and protein when she eats and reviewed foods to include and consider in each group. Discussed mechanical eating (eating outside of hunger) by adding in at least 2 more meals/snacks as well as consider adding in a nutritional shake such as ensure of boost at least 1x/day to help meet her needs while she includes more meals/snacks to help fill gaps in nutrition and to ensure she is meeting her nutritional needs. Mika is open to making changes, but reports her biggest barrier will be the mechanical eating of foods - encouraged her that making changes can take time and it is about progress, not perfection.      Intervention Plan   Intervention Prescribe, educate and counsel regarding individualized specific dietary modifications aiming towards targeted core components such as weight, hypertension, lipid management, diabetes, heart failure and other comorbidities.;Nutrition handout(s) given to patient.    Expected Outcomes Short Term Goal: Understand basic principles of dietary content, such as calories, fat, sodium, cholesterol and nutrients.;Short Term Goal: A plan has been developed with personal nutrition goals set during dietitian appointment.;Long Term Goal: Adherence to prescribed nutrition plan.             Nutrition Assessments:  MEDIFICTS Score Key: ?70 Need to make dietary changes  40-70 Heart Healthy Diet ? 40 Therapeutic Level Cholesterol Diet  Flowsheet Row Pulmonary Rehab from 09/30/2022 in [facility] Cardiac and Pulmonary Rehab  Picture Your Plate Total Score on Admission 55      Picture Your Plate Scores: <41 Unhealthy dietary pattern with  much room for improvement. 41-50 Dietary pattern unlikely to meet recommendations for good health and room for improvement. 51-60 More healthful dietary pattern, with some room for improvement.  >60 Healthy dietary pattern, although there may be some specific behaviors that could be improved.   Nutrition Goals Re-Evaluation:   Nutrition Goals Discharge (Final Nutrition Goals Re-Evaluation):   Psychosocial: Target Goals: Acknowledge presence or absence of significant depression and/or stress, maximize coping skills, provide positive support system. Participant is able to verbalize types and ability to use techniques and skills needed for reducing stress and depression.   Education: Stress, Anxiety, and Depression - Group verbal and visual presentation to define topics covered.  Reviews how body is impacted by stress, anxiety, and depression.  Also discusses healthy ways to reduce stress and to treat/manage anxiety and depression.  Written material given at graduation.   Education: Sleep Hygiene -Provides group verbal and written instruction about how sleep can affect your health.  Define sleep hygiene, discuss sleep cycles and impact of sleep habits. Review good sleep hygiene tips.    Initial Review & Psychosocial Screening:  Initial Psych Review & Screening - 09/23/22 1027       Initial Review   Current issues with None Identified      Family Dynamics   Good Support System? Yes   daughter     Barriers   Psychosocial barriers to participate in program There are no identifiable barriers or psychosocial needs.      Screening Interventions   Interventions Encouraged to exercise;To provide support and resources with identified psychosocial needs;Provide feedback about the scores to participant    Expected Outcomes Short Term goal: Utilizing psychosocial counselor, staff and physician to assist with identification of specific Stressors or current issues interfering with healing  process. Setting desired goal for each stressor or current issue identified.;Long Term Goal: Stressors or current issues are controlled or eliminated.;Short Term goal: Identification and review with participant of any Quality of  Life or Depression concerns found by scoring the questionnaire.;Long Term goal: The participant improves quality of Life and PHQ9 Scores as seen by post scores and/or verbalization of changes             Quality of Life Scores:  Scores of 19 and below usually indicate a poorer quality of life in these areas.  A difference of  2-3 points is a clinically meaningful difference.  A difference of 2-3 points in the total score of the Quality of Life Index has been associated with significant improvement in overall quality of life, self-image, physical symptoms, and general health in studies assessing change in quality of life.  PHQ-9: Review Flowsheet       09/30/2022 04/16/2017  Depression screen PHQ 2/9  Decreased Interest 0 0  Down, Depressed, Hopeless 0 0  PHQ - 2 Score 0 0  Altered sleeping 0 -  Tired, decreased energy 3 -  Change in appetite 3 -  Feeling bad or failure about yourself  3 -  Trouble concentrating 3 -  Moving slowly or fidgety/restless 0 -  Suicidal thoughts 0 -  PHQ-9 Score 12 -  Difficult doing work/chores Extremely dIfficult -   Interpretation of Total Score  Total Score Depression Severity:  1-4 = Minimal depression, 5-9 = Mild depression, 10-14 = Moderate depression, 15-19 = Moderately severe depression, 20-27 = Severe depression   Psychosocial Evaluation and Intervention:  Psychosocial Evaluation - 09/23/22 1037       Psychosocial Evaluation & Interventions   Interventions Encouraged to exercise with the program and follow exercise prescription    Comments Karyme has no barriers to attending the program. She hopes to see improvement in her shortness of breath symptoms with exertion. She lives alone. Her daughter is her support  person.  She has been diagnosed with diabetes and is diet controlled at this moment. She has another A1C scheduled in Dec. She is ready to start the program and progress her exercise prescription, learn energy conservation and improve her stamina.    Expected Outcomes STG Kiala will attend all scheduled sessions, she will meet with our LDN to review safe ways to work on weight loss and help with diabetes control. She will begin an exercise progression.  LTG Kennadi will continue to utilize the tips for exercise, breathing and risk factor control after discharge from the program.    Continue Psychosocial Services  Follow up required by staff             Psychosocial Re-Evaluation:   Psychosocial Discharge (Final Psychosocial Re-Evaluation):   Education: Education Goals: Education classes will be provided on a weekly basis, covering required topics. Participant will state understanding/return demonstration of topics presented.  Learning Barriers/Preferences:   General Pulmonary Education Topics:  Infection Prevention: - Provides verbal and written material to individual with discussion of infection control including proper hand washing and proper equipment cleaning during exercise session. Flowsheet Row Pulmonary Rehab from 10/09/2022 in [facility] Cardiac and Pulmonary Rehab  Date 09/30/22  Educator [facility]  Instruction Review Code 1- Verbalizes Understanding       Falls Prevention: - Provides verbal and written material to individual with discussion of falls prevention and safety. Flowsheet Row Pulmonary Rehab from 10/09/2022 in [facility] Cardiac and Pulmonary Rehab  Date 09/23/22  Educator SB  Instruction Review Code 1- Verbalizes Understanding       Chronic Lung Disease Review: - Group verbal instruction with posters, models, PowerPoint presentations and videos,  to review new updates,  new respiratory medications, new advancements in procedures and treatments. Providing information on  websites and "800" numbers for continued self-education. Includes information about supplement oxygen, available portable oxygen systems, continuous and intermittent flow rates, oxygen safety, concentrators, and Medicare reimbursement for oxygen. Explanation of Pulmonary Drugs, including class, frequency, complications, importance of spacers, rinsing mouth after steroid MDI's, and proper cleaning methods for nebulizers. Review of basic lung anatomy and physiology related to function, structure, and complications of lung disease. Review of risk factors. Discussion about methods for diagnosing sleep apnea and types of masks and machines for OSA. Includes a review of the use of types of environmental controls: home humidity, furnaces, filters, dust mite/pet prevention, HEPA vacuums. Discussion about weather changes, air quality and the benefits of nasal washing. Instruction on Warning signs, infection symptoms, calling MD promptly, preventive modes, and value of vaccinations. Review of effective airway clearance, coughing and/or vibration techniques. Emphasizing that all should Create an Action Plan. Written material given at graduation. Flowsheet Row Pulmonary Rehab from 10/09/2022 in [facility] Cardiac and Pulmonary Rehab  Education need identified 09/30/22       AED/CPR: - Group verbal and written instruction with the use of models to demonstrate the basic use of the AED with the basic ABC's of resuscitation.    Anatomy and Cardiac Procedures: - Group verbal and visual presentation and models provide information about basic cardiac anatomy and function. Reviews the testing methods done to diagnose heart disease and the outcomes of the test results. Describes the treatment choices: Medical Management, Angioplasty, or Coronary Bypass Surgery for treating various heart conditions including Myocardial Infarction, Angina, Valve Disease, and Cardiac Arrhythmias.  Written material given at  graduation.   Medication Safety: - Group verbal and visual instruction to review commonly prescribed medications for heart and lung disease. Reviews the medication, class of the drug, and side effects. Includes the steps to properly store meds and maintain the prescription regimen.  Written material given at graduation. Flowsheet Row Pulmonary Rehab from 10/09/2022 in [facility] Cardiac and Pulmonary Rehab  Date 10/09/22  Educator SB  Instruction Review Code 1- Verbalizes Understanding       Other: -Provides group and verbal instruction on various topics (see comments)   Knowledge Questionnaire Score:  Knowledge Questionnaire Score - 09/30/22 1107       Knowledge Questionnaire Score   Pre Score 16/18              Core Components/Risk Factors/Patient Goals at Admission:  Personal Goals and Risk Factors at Admission - 09/30/22 1107       Core Components/Risk Factors/Patient Goals on Admission    Weight Management Yes;Obesity;Weight Loss    Intervention Weight Management: Develop a combined nutrition and exercise program designed to reach desired caloric intake, while maintaining appropriate intake of nutrient and fiber, sodium and fats, and appropriate energy expenditure required for the weight goal.;Weight Management: Provide education and appropriate resources to help participant work on and attain dietary goals.;Weight Management/Obesity: Establish reasonable short term and long term weight goals.;Obesity: Provide education and appropriate resources to help participant work on and attain dietary goals.    Admit Weight 169 lb (76.7 kg)    Goal Weight: Short Term 165 lb (74.8 kg)    Goal Weight: Long Term 130 lb (59 kg)    Expected Outcomes Short Term: Continue to assess and modify interventions until short term weight is achieved;Long Term: Adherence to nutrition and physical activity/exercise program aimed toward attainment of established weight goal;Weight Loss: Understanding of  general recommendations for a balanced deficit meal plan, which promotes 1-2 lb weight loss per week and includes a negative energy balance of [phone] kcal/d;Understanding recommendations for meals to include 15-35% energy as protein, 25-35% energy from fat, 35-60% energy from carbohydrates, less than 259m of dietary cholesterol, 20-35 gm of total fiber daily;Understanding of distribution of calorie intake throughout the day with the consumption of 4-5 meals/snacks    Improve shortness of breath with ADL's Yes    Intervention Provide education, individualized exercise plan and daily activity instruction to help decrease symptoms of SOB with activities of daily living.    Expected Outcomes Short Term: Improve cardiorespiratory fitness to achieve a reduction of symptoms when performing ADLs;Long Term: Be able to perform more ADLs without symptoms or delay the onset of symptoms    Increase knowledge of respiratory medications and ability to use respiratory devices properly  Yes    Intervention Provide education and demonstration as needed of appropriate use of medications, inhalers, and oxygen therapy.    Expected Outcomes Short Term: Achieves understanding of medications use. Understands that oxygen is a medication prescribed by physician. Demonstrates appropriate use of inhaler and oxygen therapy.;Long Term: Maintain appropriate use of medications, inhalers, and oxygen therapy.    Diabetes Yes    Intervention Provide education about signs/symptoms and action to take for hypo/hyperglycemia.;Provide education about proper nutrition, including hydration, and aerobic/resistive exercise prescription along with prescribed medications to achieve blood glucose in normal ranges: Fasting glucose 65-99 mg/dL    Expected Outcomes Short Term: Participant verbalizes understanding of the signs/symptoms and immediate care of hyper/hypoglycemia, proper foot care and importance of medication, aerobic/resistive exercise and  nutrition plan for blood glucose control.;Long Term: Attainment of HbA1C < 7%.    Hypertension Yes    Intervention Provide education on lifestyle modifcations including regular physical activity/exercise, weight management, moderate sodium restriction and increased consumption of fresh fruit, vegetables, and low fat dairy, alcohol moderation, and smoking cessation.;Monitor prescription use compliance.    Expected Outcomes Short Term: Continued assessment and intervention until BP is < 140/939mHG in hypertensive participants. < 130/8060mG in hypertensive participants with diabetes, heart failure or chronic kidney disease.;Long Term: Maintenance of blood pressure at goal levels.             Education:Diabetes - Individual verbal and written instruction to review signs/symptoms of diabetes, desired ranges of glucose level fasting, after meals and with exercise. Acknowledge that pre and post exercise glucose checks will be done for 3 sessions at entry of program. [facility]  Date 09/30/22  Educ[facility] Code 1- Verbalizes Understanding       Know Your Numbers and Heart Failure: - Group verbal and visual instruction to discuss disease risk factors for cardiac and pulmonary disease and treatment options.  Reviews associated critical values for Overweight/Obesity, Hypertension, Cholesterol, and Diabetes.  Discusses basics of heart failure: signs/symptoms and treatments.  Introduces Heart Failure Zone chart for action plan for heart failure.  Written material given at graduation.   Core Components/Risk Factors/Patient Goals Review:    Core Components/Risk Factors/Patient Goals at Discharge (Final Review):    ITP Comments:  ITP Comments     Row Name 09/23/22 1048 09/30/22 1058 10/07/22 0955 10/23/22 1034     ITP Comments Virtual orientation call completed today. shehas an appointment on Date:  09/30/2022  for EP eval and gym Orientation.  Documentation of diagnosis can be found in  [facility] Date: 09/11/2022 . Completed 6MWT and gym orientation. Initial ITP created and sent for review to Dr. Zetta Bills, Medical Director. First full day of exercise!  Patient was oriented to gym and equipment including functions, settings, policies, and procedures.  Patient's individual exercise prescription and treatment plan were reviewed.  All starting workloads were established based on the results of the 6 minute walk test done at initial orientation visit.  The plan for exercise progression was also introduced and progression will be customized based on patient's performance and goals. 30 Day review completed. Medical Director ITP review done, changes made as directed, and signed approval by Medical Director.     new to prgram             Comments:

## 2022-10-25 MED ORDER — MIRTAZAPINE 15 MG TABLET
ORAL_TABLET | Freq: Every evening | ORAL | 4 refills | 90 days | Status: CP
Start: 2022-10-25 — End: ?

## 2022-10-25 NOTE — Unmapped (Signed)
Please refill if appropriate.    Most recent clinic visit: 10/02/2022    Next clinic visit: 01/01/2023

## 2022-10-30 ENCOUNTER — Encounter: Payer: 59 | Attending: Student in an Organized Health Care Education/Training Program | Admitting: *Deleted

## 2022-10-30 DIAGNOSIS — J432 Centrilobular emphysema: Secondary | ICD-10-CM | POA: Diagnosis not present

## 2022-10-30 DIAGNOSIS — R0602 Shortness of breath: Secondary | ICD-10-CM | POA: Diagnosis present

## 2022-10-30 DIAGNOSIS — Z5189 Encounter for other specified aftercare: Secondary | ICD-10-CM | POA: Diagnosis not present

## 2022-10-30 NOTE — Progress Notes (Signed)
Daily Session Note  Patient Details  Name: Lauren Escobar MRN: [id number] Date of Birth: [birth year] Referring Provider:   Flowsheet Row Pulmonary Rehab from 09/30/2022 in [facility] Cardiac and Pulmonary Rehab  Referring Provider Armando Reichert MD       Encounter Date: 10/30/2022  Check In:  Session Check In - 10/30/22 0923       Check-In   Supervising physician immediately available to respond to emergencies See telemetry face sheet for immediately available ER MD    Location [facility]-Cardiac & Pulmonary Rehab    Staff Present Darlyne Russian, RN, Dimple Nanas, BS, Exercise Physiologist;Joseph Tessie Fass, [place]    Virtual Visit No    Medication changes reported     No    Fall or balance concerns reported    No    Warm-up and Cool-down Performed on first and last piece of equipment    Resistance Training Performed Yes    VAD Patient? No    PAD/SET Patient? No      Pain Assessment   Currently in Pain? No/denies                Social History   Tobacco Use  Smoking Status Former   Packs/day: 2.75   Years: 29.00   Total pack years: 79.75   Types: Cigarettes   Quit date: 10/2020   Years since quitting: 2.0  Smokeless Tobacco Never    Goals Met:  Independence with exercise equipment Exercise tolerated well No report of concerns or symptoms today Strength training completed today  Goals Unmet:  Not Applicable  Comments: Pt able to follow exercise prescription today without complaint.  Will continue to monitor for progression.    Dr. Emily Filbert is Medical Director for [place].  Dr. Ottie Glazier is Medical Director for [facility] Pulmonary Rehabilitation.

## 2022-11-01 ENCOUNTER — Encounter: Payer: 59 | Admitting: *Deleted

## 2022-11-01 DIAGNOSIS — J432 Centrilobular emphysema: Secondary | ICD-10-CM

## 2022-11-01 DIAGNOSIS — R0602 Shortness of breath: Secondary | ICD-10-CM

## 2022-11-01 NOTE — Progress Notes (Signed)
Daily Session Note  Patient Details  Name: Lauren Escobar MRN: [id number] Date of Birth: 1966/10/12 Referring Provider:   Flowsheet Row Pulmonary Rehab from 09/30/2022 in [facility] Cardiac and Pulmonary Rehab  Referring Provider Armando Reichert MD       Encounter Date: 11/01/2022  Check In:  Session Check In - 11/01/22 1009       Check-In   Supervising physician immediately available to respond to emergencies See telemetry face sheet for immediately available ER MD    Location [facility]-Cardiac & Pulmonary Rehab    Staff Present Heath Lark, RN, BSN, CCRP;Jessica Appleton, MA, RCEP, CCRP, CCET;Joseph [place], [place]    Virtual Visit No    Medication changes reported     No    Fall or balance concerns reported    No    Warm-up and Cool-down Performed on first and last piece of equipment    Resistance Training Performed Yes    VAD Patient? No    PAD/SET Patient? No      Pain Assessment   Currently in Pain? No/denies                Social History   Tobacco Use  Smoking Status Former   Packs/day: 2.75   Years: 29.00   Total pack years: 79.75   Types: Cigarettes   Quit date: 10/2020   Years since quitting: 2.0  Smokeless Tobacco Never    Goals Met:  Proper associated with RPD/PD & O2 Sat Independence with exercise equipment Exercise tolerated well No report of concerns or symptoms today  Goals Unmet:  Not Applicable  Comments: Pt able to follow exercise prescription today without complaint.  Will continue to monitor for progression.    Dr. Emily Filbert is Medical Director for [place].  Dr. Ottie Glazier is Medical Director for [facility] Pulmonary Rehabilitation.

## 2022-11-04 ENCOUNTER — Encounter: Payer: 59 | Admitting: *Deleted

## 2022-11-04 DIAGNOSIS — R0602 Shortness of breath: Secondary | ICD-10-CM

## 2022-11-04 DIAGNOSIS — J432 Centrilobular emphysema: Secondary | ICD-10-CM

## 2022-11-04 NOTE — Progress Notes (Signed)
Daily Session Note  Patient Details  Name: Lauren Escobar MRN: [id number] Date of Birth: 03-18-1966 Referring Provider:   Flowsheet Row Pulmonary Rehab from 09/30/2022 in [facility] Cardiac and Pulmonary Rehab  Referring Provider Armando Reichert MD       Encounter Date: 11/04/2022  Check In:  Session Check In - 11/04/22 0946       Check-In   Supervising physician immediately available to respond to emergencies See telemetry face sheet for immediately available ER MD    Location [facility]-Cardiac & Pulmonary Rehab    Staff Present Darlyne Russian, RN, Doyce Para, BS, ACSM CEP, Exercise Physiologist;Noah Tickle, BS, Exercise Physiologist    Virtual Visit No    Medication changes reported     No    Fall or balance concerns reported    No    Warm-up and Cool-down Performed on first and last piece of equipment    Resistance Training Performed Yes    VAD Patient? No    PAD/SET Patient? No      Pain Assessment   Currently in Pain? No/denies                Social History   Tobacco Use  Smoking Status Former   Packs/day: 2.75   Years: 29.00   Total pack years: 79.75   Types: Cigarettes   Quit date: 10/2020   Years since quitting: 2.0  Smokeless Tobacco Never    Goals Met:  Independence with exercise equipment Exercise tolerated well No report of concerns or symptoms today Strength training completed today  Goals Unmet:  Not Applicable  Comments: Pt able to follow exercise prescription today without complaint.  Will continue to monitor for progression.    Dr. Emily Filbert is Medical Director for [place].  Dr. Ottie Glazier is Medical Director for [facility] Pulmonary Rehabilitation.

## 2022-11-06 ENCOUNTER — Encounter: Payer: 59 | Admitting: *Deleted

## 2022-11-08 ENCOUNTER — Encounter: Payer: 59 | Admitting: *Deleted

## 2022-11-08 DIAGNOSIS — J432 Centrilobular emphysema: Secondary | ICD-10-CM | POA: Diagnosis not present

## 2022-11-08 DIAGNOSIS — R0602 Shortness of breath: Secondary | ICD-10-CM

## 2022-11-08 NOTE — Progress Notes (Signed)
Daily Session Note  Patient Details  Name: Lauren Escobar MRN: [id number] Date of Birth: Aug 25, 1966 Referring Provider:   Flowsheet Row Pulmonary Rehab from 09/30/2022 in [facility] Cardiac and Pulmonary Rehab  Referring Provider Armando Reichert MD       Encounter Date: 11/08/2022  Check In:  Session Check In - 11/08/22 1010       Check-In   Supervising physician immediately available to respond to emergencies See telemetry face sheet for immediately available ER MD    Location [facility]-Cardiac & Pulmonary Rehab    Staff Present Heath Lark, RN, BSN, CCRP;Jessica Lacey, MA, RCEP, CCRP, CCET;Joseph [place], [place]    Virtual Visit No    Medication changes reported     No    Fall or balance concerns reported    No    Warm-up and Cool-down Performed on first and last piece of equipment    Resistance Training Performed Yes    VAD Patient? No    PAD/SET Patient? No      Pain Assessment   Currently in Pain? No/denies                Social History   Tobacco Use  Smoking Status Former   Packs/day: 2.75   Years: 29.00   Total pack years: 79.75   Types: Cigarettes   Quit date: 10/2020   Years since quitting: 2.0  Smokeless Tobacco Never    Goals Met:  Proper associated with RPD/PD & O2 Sat Independence with exercise equipment Exercise tolerated well No report of concerns or symptoms today  Goals Unmet:  Not Applicable  Comments: Pt able to follow exercise prescription today without complaint.  Will continue to monitor for progression.    Dr. Emily Filbert is Medical Director for [place].  Dr. Ottie Glazier is Medical Director for [facility] Pulmonary Rehabilitation.

## 2022-11-11 ENCOUNTER — Encounter: Payer: 59 | Admitting: *Deleted

## 2022-11-11 DIAGNOSIS — J432 Centrilobular emphysema: Secondary | ICD-10-CM

## 2022-11-11 DIAGNOSIS — R0602 Shortness of breath: Secondary | ICD-10-CM

## 2022-11-11 NOTE — Progress Notes (Signed)
Daily Session Note  Patient Details  Name: Lauren Escobar MRN: [id number] Date of Birth: Apr 11, 1966 Referring Provider:   Flowsheet Row Pulmonary Rehab from 09/30/2022 in [facility] Cardiac and Pulmonary Rehab  Referring Provider Armando Reichert MD       Encounter Date: 11/11/2022  Check In:  Session Check In - 11/11/22 0959       Check-In   Supervising physician immediately available to respond to emergencies See telemetry face sheet for immediately available ER MD    Location [facility]-Cardiac & Pulmonary Rehab    Staff Present Darlyne Russian, RN, Doyce Para, BS, ACSM CEP, Exercise Physiologist;Noah Tickle, BS, Exercise Physiologist    Virtual Visit No    Medication changes reported     No    Fall or balance concerns reported    No    Warm-up and Cool-down Performed on first and last piece of equipment    Resistance Training Performed Yes    VAD Patient? No    PAD/SET Patient? No      Pain Assessment   Currently in Pain? No/denies                Social History   Tobacco Use  Smoking Status Former   Packs/day: 2.75   Years: 29.00   Total pack years: 79.75   Types: Cigarettes   Quit date: 10/2020   Years since quitting: 2.0  Smokeless Tobacco Never    Goals Met:  Independence with exercise equipment Exercise tolerated well No report of concerns or symptoms today Strength training completed today  Goals Unmet:  Not Applicable  Comments: Pt able to follow exercise prescription today without complaint.  Will continue to monitor for progression.    Dr. Emily Filbert is Medical Director for [place].  Dr. Ottie Glazier is Medical Director for [facility] Pulmonary Rehabilitation.

## 2022-11-12 NOTE — Unmapped (Signed)
Patient did not show for her scheduled [facility] psychiatry appointment this morning. I called her and she told me that she doesn't know how to do video appointments and that she did not know that she had an appointment with me. I offered to reschedule her appointment on a day that she could come in to see me in the clinic, but she stated that she didn't need the appointment. I let her know that I'm available in the future if she feels she would benefit from an appointment with me.

## 2022-11-12 NOTE — Unmapped (Signed)
[facility] Refill Coordination Note    Specialty Medication(s) to be Shipped:   Hematology/Oncology: [name]    Other medication(s) to be shipped: No additional medications requested for fill at this time     LOVELY GAVILANES, DOB: 05-Feb-1966  Phone: [phone] (home)       All above HIPAA information was verified with patient.     Was a Nurse, learning disability used for this call? No    Completed refill call assessment today to schedule patient's medication shipment from the [facility] [phone]).  All relevant notes have been reviewed.     Specialty medication(s) and dose(s) confirmed: Regimen is correct and unchanged.   Changes to medications: Evanell reports no changes at this time.  Changes to insurance: No  New side effects reported not previously addressed with a pharmacist or physician: None reported  Questions for the pharmacist: No    Confirmed patient received a Conservation officer, historic buildings and a Surveyor, mining with first shipment. The patient will receive a drug information handout for each medication shipped and additional FDA Medication Guides as required.       DISEASE/MEDICATION-SPECIFIC INFORMATION        N/A    SPECIALTY MEDICATION ADHERENCE     Medication Adherence    Patient reported X missed doses in the last month: 0  Specialty Medication: [name]  Patient is on additional specialty medications: No  Informant: patient                       Were doses missed due to medication being on hold? No    Jakafi 15mg  : 10 days of medicine on hand       REFERRAL TO PHARMACIST     Referral to the pharmacist: Not needed      [facility]     Shipping address confirmed in Epic.     Delivery Scheduled: Yes, Expected medication delivery date: 11/19/22.     Medication will be delivered via Next Day Courier to the prescription address in Epic [place].    [name]   [facility] Specialty Technician

## 2022-11-15 ENCOUNTER — Encounter: Payer: 59 | Admitting: *Deleted

## 2022-11-15 DIAGNOSIS — J432 Centrilobular emphysema: Secondary | ICD-10-CM

## 2022-11-15 DIAGNOSIS — R0602 Shortness of breath: Secondary | ICD-10-CM

## 2022-11-15 NOTE — Progress Notes (Signed)
Daily Session Note  Patient Details  Name: Lauren Escobar MRN: [id number] Date of Birth: 04-06-1966 Referring Provider:   April Manson Pulmonary Rehab from 09/30/2022 in [facility]  Referring Provider Armando Reichert MD       Encounter Date: 11/15/2022  Check In:  Session Check In - 11/15/22 1039       Check-In   Supervising physician immediately available to respond to emergencies See telemetry face sheet for immediately available ER MD    Location [facility]-Cardiac & Pulmonary Rehab    Staff Present Heath Lark, RN, BSN, CCRP;Marvell Fuller, PhD, RN, CNS, CEN;Joseph Tessie Fass, [place]    Virtual Visit No    Medication changes reported     No    Fall or balance concerns reported    No    Warm-up and Cool-down Performed on first and last piece of equipment    Resistance Training Performed Yes    VAD Patient? No    PAD/SET Patient? No      Pain Assessment   Currently in Pain? No/denies                Social History   Tobacco Use  Smoking Status Former   Packs/day: 2.75   Years: 29.00   Total pack years: 79.75   Types: Cigarettes   Quit date: 10/2020   Years since quitting: 2.0  Smokeless Tobacco Never    Goals Met:  Proper associated with RPD/PD & O2 Sat Independence with exercise equipment Exercise tolerated well No report of concerns or symptoms today  Goals Unmet:  Not Applicable  Comments: Pt able to follow exercise prescription today without complaint.  Will continue to monitor for progression.    Dr. Emily Filbert is Medical Director for [facility].  Dr. Ottie Glazier is Medical Director for [facility] Pulmonary Rehabilitation.

## 2022-11-18 ENCOUNTER — Encounter: Payer: 59 | Admitting: *Deleted

## 2022-11-18 DIAGNOSIS — J432 Centrilobular emphysema: Secondary | ICD-10-CM

## 2022-11-18 DIAGNOSIS — R0602 Shortness of breath: Secondary | ICD-10-CM

## 2022-11-18 MED FILL — JAKAFI 15 MG TABLET: ORAL | 30 days supply | Qty: 60 | Fill #3

## 2022-11-18 NOTE — Progress Notes (Signed)
Daily Session Note  Patient Details  Name: Lauren Escobar MRN: [id number] Date of Birth: 1966-04-06 Referring Provider:   Flowsheet Row Pulmonary Rehab from 09/30/2022 in [facility]iac [facility]  Referring Provider Armando Reichert MD       Encounter Date: 11/18/2022  Check In:  Session Check In - 11/18/22 0941       Check-In   Supervising physician immediately available to respond to emergencies See telemetry face sheet for immediately available ER MD    Location [facility]-Cardiac & Pulmonary Rehab    Staff Present Darlyne Russian, RN, Doyce Para, BS, ACSM CEP, Exercise Physiologist;Noah Tickle, BS, Exercise Physiologist    Virtual Visit No    Medication changes reported     No    Fall or balance concerns reported    No    Warm-up and Cool-down Performed on first and last piece of equipment    Resistance Training Performed Yes    VAD Patient? No    PAD/SET Patient? No      Pain Assessment   Currently in Pain? No/denies                Social History   Tobacco Use  Smoking Status Former   Packs/day: 2.75   Years: 29.00   Total pack years: 79.75   Types: Cigarettes   Quit date: 10/2020   Years since quitting: 2.0  Smokeless Tobacco Never    Goals Met:  Independence with exercise equipment Exercise tolerated well No report of concerns or symptoms today Strength training completed today  Goals Unmet:  Not Applicable  Comments: Pt able to follow exercise prescription today without complaint.  Will continue to monitor for progression.    Dr. Emily Filbert is Medical Director for [place].  Dr. Ottie Glazier is Medical Director for [facility] Pulmonary Rehabilitation.

## 2022-11-20 ENCOUNTER — Other Ambulatory Visit: Payer: Self-pay | Admitting: Family Medicine

## 2022-11-20 ENCOUNTER — Encounter: Payer: 59 | Admitting: *Deleted

## 2022-11-20 ENCOUNTER — Encounter: Payer: Self-pay | Admitting: *Deleted

## 2022-11-20 DIAGNOSIS — R0602 Shortness of breath: Secondary | ICD-10-CM

## 2022-11-20 DIAGNOSIS — J432 Centrilobular emphysema: Secondary | ICD-10-CM

## 2022-11-20 DIAGNOSIS — Z1231 Encounter for screening mammogram for malignant neoplasm of breast: Secondary | ICD-10-CM

## 2022-11-20 NOTE — Progress Notes (Signed)
Pulmonary Individual Treatment Plan  Patient Details  Name: Lauren Escobar MRN: [id number] Date of Birth: 03-22-1966 Referring Provider:   Flowsheet Row Pulmonary Rehab from 09/30/2022 in [facility] Cardiac and Pulmonary Rehab  Referring Provider Armando Reichert MD       Initial Encounter Date:  Flowsheet Row Pulmonary Rehab from 09/30/2022 in [facility] Cardiac and Pulmonary Rehab  Date 09/30/22       Visit Diagnosis: Centrilobular emphysema ([place])  Shortness of breath  Patient's Home Medications on Admission:  Current Outpatient Medications:    albuterol (VENTOLIN HFA) 108 (90 Base) MCG/ACT inhaler, Inhale into the lungs., Disp: , Rfl:    aspirin EC 81 MG tablet, Take by mouth., Disp: , Rfl:    atorvastatin (LIPITOR) 20 MG tablet, TAKE 1 TABLET BY MOUTH AT BEDTIME FOR HIGH CHOLESTEROL, Disp: , Rfl:    cetirizine (ZYRTEC) 10 MG tablet, TAKE 1 TABLET BY MOUTH DAILY FOR ALLERGIES, Disp: , Rfl:    dextromethorphan-guaiFENesin (MUCINEX DM) 30-600 MG 12hr tablet, Take 1 tablet by mouth 2 (two) times daily as needed for cough., Disp: 14 tablet, Rfl: 0   esomeprazole (NEXIUM) 40 MG capsule, Take 40 mg by mouth daily. , Disp: , Rfl:    ferrous sulfate 325 (65 FE) MG tablet, Take by mouth., Disp: , Rfl:    fluticasone furoate-vilanterol (BREO ELLIPTA) 100-25 MCG/ACT AEPB, Inhale 1 puff into the lungs daily., Disp: 30 each, Rfl: 11   folic acid (FOLVITE) 1 MG tablet, Take 1 mg by mouth daily., Disp: , Rfl:    isosorbide mononitrate (IMDUR) 30 MG 24 hr tablet, Take 30 mg by mouth daily., Disp: , Rfl:    Multiple Vitamins-Minerals (MULTIVIT/MULTIMINERAL ADULT PO), Take by mouth., Disp: , Rfl:    nicotine (NICODERM CQ - DOSED IN MG/24 HOURS) 21 mg/24hr patch, Place 1 patch (21 mg total) onto the skin daily., Disp: 28 patch, Rfl: 0   NITROGLYCERIN SL, Place 1 tablet under the tongue. (Patient not taking: Reported on 09/11/2022), Disp: , Rfl:    oseltamivir (TAMIFLU) 75 MG capsule, Take 1 capsule (75 mg  total) by mouth 2 (two) times daily., Disp: 5 capsule, Rfl: 0   promethazine (PHENERGAN) 12.5 MG tablet, Take 1 tablet (12.5 mg total) by mouth every 6 (six) hours as needed for nausea or vomiting., Disp: 20 tablet, Rfl: 0   propranolol (INDERAL) 40 MG tablet, TAKE 1 TABLET TWICE DAILY TO PREVENT HEADACHES, Disp: , Rfl:    REXULTI 0.5 MG TABS, TAKE 1 TABLET BY MOUTH AT BEDTIME FOR 1 WEEK THEN INCREASE TO 1 MG TABLET, Disp: 67 tablet, Rfl: 1   risperiDONE (RISPERDAL) 1 MG tablet, Take 0.5 tablets (0.5 mg total) by mouth at bedtime for 7 days, THEN 1 tablet (1 mg total) at bedtime., Disp: 33.5 tablet, Rfl: 0   venlafaxine XR (EFFEXOR-XR) 150 MG 24 hr capsule, Take 2 capsules (300 mg total) by mouth every morning., Disp: 30 capsule, Rfl: 3   VRAYLAR 1.5 MG capsule, Take 1 capsule (1.5 mg total) by mouth daily., Disp: 30 capsule, Rfl: 3  Past Medical History: Past Medical History:  Diagnosis Date   Abdominal pain    Abnormal mammogram    Anxiety    Asthma    Bleeding disorder ([place])    Blood dyscrasia    polycythemia vera   Chronic kidney disease    COPD (chronic obstructive pulmonary disease) (HCC)    Cough    Depression    Diabetes mellitus without complication ([place])  Fatigue    Headache 2000   Horizontal nystagmus    age 57   Hyperglycemia    Hypertension    Low back pain    Major depressive disorder    Nasal lesion    Neck pain    Snoring    SOB (shortness of breath)    Tobacco use    UTI (urinary tract infection)    Von Willebrand's disease ([place])     Tobacco Use: Social History   Tobacco Use  Smoking Status Former   Packs/day: 2.75   Years: 29.00   Total pack years: 79.75   Types: Cigarettes   Quit date: 10/2020   Years since quitting: 2.0  Smokeless Tobacco Never    Labs: Review Flowsheet       Latest Ref Rng & Units 04/06/2013 01/01/2014 06/16/2017 10/15/2022  Labs for ITP Cardiac and Pulmonary Rehab  Cholestrol 0 - 200 mg/dL 119  109  - 175   LDL (calc) 0  - 99 mg/dL 34  40  - 92   HDL-C >40 mg/dL 32  34  - 49   Trlycerides <150 mg/dL 267  175  - 172   Hemoglobin A1c 4.8 - 5.6 % 5.2  5.5  5.5  6.1   Bicarbonate 20.0 - 28.0 mmol/L - - - 23.4   Acid-base deficit 0.0 - 2.0 mmol/L - - - 0.7   O2 Saturation % - - - 84.1      Pulmonary Assessment Scores:  Pulmonary Assessment Scores     Row Name 09/30/22 1109         ADL UCSD   ADL Phase Entry     SOB Score total 62     Rest 1     Walk 2     Stairs 5     Bath 4     Dress 2     Shop 3       CAT Score   CAT Score 20       mMRC Score   mMRC Score 3              UCSD: Self-administered rating of dyspnea associated with activities of daily living (ADLs) 6-point scale (0 = "not at all" to 5 = "maximal or unable to do because of breathlessness")  Scoring Scores range from 0 to 120.  Minimally important difference is 5 units  CAT: CAT can identify the health impairment of COPD patients and is better correlated with disease progression.  CAT has a scoring range of zero to 40. The CAT score is classified into four groups of low (less than 10), medium (10 - 20), high (21-30) and very high (31-40) based on the impact level of disease on health status. A CAT score over 10 suggests significant symptoms.  A worsening CAT score could be explained by an exacerbation, poor medication adherence, poor inhaler technique, or progression of COPD or comorbid conditions.  CAT MCID is 2 points  mMRC: mMRC (Modified Medical Research Council) Dyspnea Scale is used to assess the degree of baseline functional disability in patients of respiratory disease due to dyspnea. No minimal important difference is established. A decrease in score of 1 point or greater is considered a positive change.   Pulmonary Function Assessment:  Pulmonary Function Assessment - 09/30/22 1109       Breath   Shortness of Breath Yes;Fear of Shortness of Breath;Limiting activity;Panic with Shortness of Breath  Exercise Target Goals: Exercise Program Goal: Individual exercise prescription set using results from initial 6 min walk test and THRR while considering  patient's activity barriers and safety.   Exercise Prescription Goal: Initial exercise prescription builds to 30-45 minutes a day of aerobic activity, 2-3 days per week.  Home exercise guidelines will be given to patient during program as part of exercise prescription that the participant will acknowledge.  Education: Aerobic Exercise: - Group verbal and visual presentation on the components of exercise prescription. Introduces F.I.T.T principle from ACSM for exercise prescriptions.  Reviews F.I.T.T. principles of aerobic exercise including progression. Written material given at graduation.   Education: Resistance Exercise: - Group verbal and visual presentation on the components of exercise prescription. Introduces F.I.T.T principle from ACSM for exercise prescriptions  Reviews F.I.T.T. principles of resistance exercise including progression. Written material given at graduation. Flowsheet Row Pulmonary Rehab from 11/20/2022 in [facility] Cardiac and Pulmonary Rehab  Date 11/20/22  Educator [facility]  Instruction Review Code 1- Verbalizes Understanding        Education: Exercise & Equipment Safety: - Individual verbal instruction and demonstration of equipment use and safety with use of the equipment. Flowsheet Row Pulmonary Rehab from 11/20/2022 in [facility] Cardiac and Pulmonary Rehab  Date 09/30/22  Educator [facility]  Instruction Review Code 1- Verbalizes Understanding       Education: Exercise Physiology & General Exercise Guidelines: - Group verbal and written instruction with models to review the exercise physiology of the cardiovascular system and associated critical values. Provides general exercise guidelines with specific guidelines to those with heart or lung disease.    Education: Flexibility, Balance, Mind/Body Relaxation: - Group  verbal and visual presentation with interactive activity on the components of exercise prescription. Introduces F.I.T.T principle from ACSM for exercise prescriptions. Reviews F.I.T.T. principles of flexibility and balance exercise training including progression. Also discusses the mind body connection.  Reviews various relaxation techniques to help reduce and manage stress (i.e. Deep breathing, progressive muscle relaxation, and visualization). Balance handout provided to take home. Written material given at graduation. Flowsheet Row Pulmonary Rehab from 11/20/2022 in [facility] Cardiac and Pulmonary Rehab  Date 11/20/22  Educator [facility]  Instruction Review Code 1- Verbalizes Understanding       Activity Barriers & Risk Stratification:  Activity Barriers & Cardiac Risk Stratification - 09/30/22 1100       Activity Barriers & Cardiac Risk Stratification   Activity Barriers Left Knee Replacement;Shortness of Breath;Balance Concerns;History of Falls;Muscular Weakness;Deconditioning;Back Problems   occasional back pain            6 Minute Walk:  6 Minute Walk     Row Name 09/30/22 1058         6 Minute Walk   Phase Initial     Distance 1315 feet     Walk Time 6 minutes     # of Rest Breaks 0     MPH 2.49     METS 3.55     RPE 15     Perceived Dyspnea  3     VO2 Peak 12.42     Symptoms Yes (comment)     Comments SOB, Fatigue     Resting HR 83 bpm     Resting BP 124/66     Resting Oxygen Saturation  96 %     Exercise Oxygen Saturation  during 6 min walk 95 %     Max Ex. HR 106 bpm     Max Ex. BP 146/74  2 Minute Post BP 136/72       Interval HR   1 Minute HR 104     2 Minute HR 104     3 Minute HR 103     4 Minute HR 106     5 Minute HR 101     6 Minute HR 101     2 Minute Post HR 82     Interval Heart Rate? Yes       Interval Oxygen   Interval Oxygen? Yes     Baseline Oxygen Saturation % 96 %     1 Minute Oxygen Saturation % 95 %     1 Minute Liters of Oxygen 0 L   Room Air     2 Minute Oxygen Saturation % 96 %     2 Minute Liters of Oxygen 0 L     3 Minute Oxygen Saturation % 96 %     3 Minute Liters of Oxygen 0 L     4 Minute Oxygen Saturation % 96 %     4 Minute Liters of Oxygen 0 L     5 Minute Oxygen Saturation % 95 %     5 Minute Liters of Oxygen 0 L     6 Minute Oxygen Saturation % 96 %     6 Minute Liters of Oxygen 0 L     2 Minute Post Oxygen Saturation % 96 %     2 Minute Post Liters of Oxygen 0 L             Oxygen Initial Assessment:  Oxygen Initial Assessment - 09/30/22 1109       Home Oxygen   Home Oxygen Device None    Sleep Oxygen Prescription None    Home Exercise Oxygen Prescription None    Home Resting Oxygen Prescription None      Initial 6 min Walk   Oxygen Used None      Program Oxygen Prescription   Program Oxygen Prescription None      Intervention   Short Term Goals To learn and demonstrate proper pursed lip breathing techniques or other breathing techniques. ;To learn and understand importance of maintaining oxygen saturations>88%;To learn and demonstrate proper use of respiratory medications;To learn and understand importance of monitoring SPO2 with pulse oximeter and demonstrate accurate use of the pulse oximeter.    Long  Term Goals Maintenance of O2 saturations>88%;Exhibits proper breathing techniques, such as pursed lip breathing or other method taught during program session;Compliance with respiratory medication;Demonstrates proper use of MDI's;Verbalizes importance of monitoring SPO2 with pulse oximeter and return demonstration             Oxygen Re-Evaluation:  Oxygen Re-Evaluation     Row Name 10/07/22 0956 11/01/22 0943           Program Oxygen Prescription   Program Oxygen Prescription -- None        Home Oxygen   Home Oxygen Device -- None      Sleep Oxygen Prescription -- None      Home Exercise Oxygen Prescription -- None      Home Resting Oxygen Prescription -- None         Goals/Expected Outcomes   Short Term Goals -- To learn and understand importance of maintaining oxygen saturations>88%;To learn and understand importance of monitoring SPO2 with pulse oximeter and demonstrate accurate use of the pulse oximeter.      Long  Term Goals -- Maintenance of O2 saturations>88%;Verbalizes  importance of monitoring SPO2 with pulse oximeter and return demonstration      Comments Reviewed PLB technique with pt.  Talked about how it works and it's importance in maintaining their exercise saturations. She does not have a pulse oximeter to check her oxygen saturation at home. Informed her where to get one and explained why it is important to have one. Reviewed that oxygen saturations should be 88 percent and above. Patient verbalizes understanding.      Goals/Expected Outcomes Short: Become more profiecient at using PLB. Long: Become independent at using PLB. Short: monitor oxygen at home with exertion. Long: maintain oxygen saturations above 88 percent independently.               Oxygen Discharge (Final Oxygen Re-Evaluation):  Oxygen Re-Evaluation - 11/01/22 0943       Program Oxygen Prescription   Program Oxygen Prescription None      Home Oxygen   Home Oxygen Device None    Sleep Oxygen Prescription None    Home Exercise Oxygen Prescription None    Home Resting Oxygen Prescription None      Goals/Expected Outcomes   Short Term Goals To learn and understand importance of maintaining oxygen saturations>88%;To learn and understand importance of monitoring SPO2 with pulse oximeter and demonstrate accurate use of the pulse oximeter.    Long  Term Goals Maintenance of O2 saturations>88%;Verbalizes importance of monitoring SPO2 with pulse oximeter and return demonstration    Comments She does not have a pulse oximeter to check her oxygen saturation at home. Informed her where to get one and explained why it is important to have one. Reviewed that oxygen saturations should  be 88 percent and above. Patient verbalizes understanding.    Goals/Expected Outcomes Short: monitor oxygen at home with exertion. Long: maintain oxygen saturations above 88 percent independently.             Initial Exercise Prescription:  Initial Exercise Prescription - 09/30/22 1100       Date of Initial Exercise RX and Referring Provider   Date 09/30/22    Referring Provider Armando Reichert MD      NuStep   Level 3    SPM 80    Minutes 15    METs 2      Arm Ergometer   Level 1    RPM 25    Minutes 15    METs 2      Biostep-RELP   Level 2    SPM 50    Minutes 15    METs 2      Track   Laps 34    Minutes 15    METs 2.85      Prescription Details   Frequency (times per week) 3    Duration Progress to 30 minutes of continuous aerobic without signs/symptoms of physical distress      Intensity   THRR 40-80% of Max Heartrate 115-147    Ratings of Perceived Exertion 11-13    Perceived Dyspnea 0-4      Progression   Progression Continue to progress workloads to maintain intensity without signs/symptoms of physical distress.      Resistance Training   Training Prescription Yes    Weight 3 lb    Reps 10-15             Perform Capillary Blood Glucose checks as needed.  Exercise Prescription Changes:   Exercise Prescription Changes     Row Name 09/30/22 1100 10/22/22 1400 11/04/22  1100 11/18/22 1500       Response to Exercise   Blood Pressure (Admit) 124/66 116/70 128/64 110/60    Blood Pressure (Exercise) 146/74 134/72 134/70 128/66    Blood Pressure (Exit) 126/62 114/66 126/64 102/62    Heart Rate (Admit) 83 bpm 84 bpm 87 bpm 87 bpm    Heart Rate (Exercise) 106 bpm 102 bpm 100 bpm 98 bpm    Heart Rate (Exit) 77 bpm 93 bpm 82 bpm 85 bpm    Oxygen Saturation (Admit) 96 % 99 % 98 % 97 %    Oxygen Saturation (Exercise) 95 % 95 % 93 % 97 %    Oxygen Saturation (Exit) 98 % 98 % 98 % 99 %    Rating of Perceived Exertion (Exercise) '15 15 15 14     '$ Perceived Dyspnea (Exercise) '3 3 3 2    '$ Symptoms SOB, fatigue SOB SOB SOB    Comments walk test results First 2 weeks of exercise -- --    Duration -- Continue with 30 min of aerobic exercise without signs/symptoms of physical distress. Continue with 30 min of aerobic exercise without signs/symptoms of physical distress. Continue with 30 min of aerobic exercise without signs/symptoms of physical distress.    Intensity -- THRR unchanged THRR unchanged THRR unchanged      Progression   Progression -- Continue to progress workloads to maintain intensity without signs/symptoms of physical distress. Continue to progress workloads to maintain intensity without signs/symptoms of physical distress. Continue to progress workloads to maintain intensity without signs/symptoms of physical distress.    Average METs -- 2 1.9 2.33      Resistance Training   Training Prescription -- Yes Yes Yes    Weight -- 3 lb 3 lb 3 lb    Reps -- 10-15 10-15 10-15      Interval Training   Interval Training -- No No No      NuStep   Level -- 3 -- 4    Minutes -- 15 -- 15    METs -- 2 -- --      Arm Ergometer   Level -- 1 -- 2.1    Minutes -- 15 -- 15    METs -- 2 -- 2      T5 Nustep   Level -- -- 3 3    Minutes -- -- 30 30    METs -- -- 1.7 --      Biostep-RELP   Level -- 3 1 --    Minutes -- 15 15 --    METs -- 2 2 --      Track   Laps -- 32 -- 28    Minutes -- 15 -- 15    METs -- 2.74 -- 2.52      Oxygen   Maintain Oxygen Saturation -- 88% or higher 88% or higher 88% or higher             Exercise Comments:   Exercise Comments     Row Name 10/07/22 0955           Exercise Comments First full day of exercise!  Patient was oriented to gym and equipment including functions, settings, policies, and procedures.  Patient's individual exercise prescription and treatment plan were reviewed.  All starting workloads were established based on the results of the 6 minute walk test done at initial  orientation visit.  The plan for exercise progression was also introduced and progression will be customized based on  patient's performance and goals.                Exercise Goals and Review:   Exercise Goals     Row Name 09/30/22 1102             Exercise Goals   Increase Physical Activity Yes       Intervention Provide advice, education, support and counseling about physical activity/exercise needs.;Develop an individualized exercise prescription for aerobic and resistive training based on initial evaluation findings, risk stratification, comorbidities and participant's personal goals.       Expected Outcomes Long Term: Exercising regularly at least 3-5 days a week.;Long Term: Add in home exercise to make exercise part of routine and to increase amount of physical activity.;Short Term: Attend rehab on a regular basis to increase amount of physical activity.       Increase Strength and Stamina Yes       Intervention Provide advice, education, support and counseling about physical activity/exercise needs.;Develop an individualized exercise prescription for aerobic and resistive training based on initial evaluation findings, risk stratification, comorbidities and participant's personal goals.       Expected Outcomes Short Term: Increase workloads from initial exercise prescription for resistance, speed, and METs.;Short Term: Perform resistance training exercises routinely during rehab and add in resistance training at home;Long Term: Improve cardiorespiratory fitness, muscular endurance and strength as measured by increased METs and functional capacity (6MWT)       Able to understand and use rate of perceived exertion (RPE) scale Yes       Intervention Provide education and explanation on how to use RPE scale       Expected Outcomes Short Term: Able to use RPE daily in rehab to express subjective intensity level;Long Term:  Able to use RPE to guide intensity level when exercising  independently       Able to understand and use Dyspnea scale Yes       Intervention Provide education and explanation on how to use Dyspnea scale       Expected Outcomes Short Term: Able to use Dyspnea scale daily in rehab to express subjective sense of shortness of breath during exertion;Long Term: Able to use Dyspnea scale to guide intensity level when exercising independently       Knowledge and understanding of Target Heart Rate Range (THRR) Yes       Intervention Provide education and explanation of THRR including how the numbers were predicted and where they are located for reference       Expected Outcomes Short Term: Able to state/look up THRR;Short Term: Able to use daily as guideline for intensity in rehab;Long Term: Able to use THRR to govern intensity when exercising independently       Able to check pulse independently Yes       Intervention Provide education and demonstration on how to check pulse in carotid and radial arteries.;Review the importance of being able to check your own pulse for safety during independent exercise       Expected Outcomes Short Term: Able to explain why pulse checking is important during independent exercise;Long Term: Able to check pulse independently and accurately       Understanding of Exercise Prescription Yes       Intervention Provide education, explanation, and written materials on patient's individual exercise prescription       Expected Outcomes Short Term: Able to explain program exercise prescription;Long Term: Able to explain home exercise prescription to exercise independently  Exercise Goals Re-Evaluation :  Exercise Goals Re-Evaluation     Row Name 10/07/22 0955 10/22/22 1426 11/04/22 1152 11/18/22 1525       Exercise Goal Re-Evaluation   Exercise Goals Review Able to understand and use rate of perceived exertion (RPE) scale;Able to understand and use Dyspnea scale;Knowledge and understanding of Target Heart Rate Range  (THRR);Understanding of Exercise Prescription Increase Physical Activity;Increase Strength and Stamina;Understanding of Exercise Prescription Increase Physical Activity;Increase Strength and Stamina;Understanding of Exercise Prescription Increase Physical Activity;Increase Strength and Stamina;Understanding of Exercise Prescription    Comments Reviewed RPE scale, THR and program prescription with pt today.  Pt voiced understanding and was given a copy of goals to take home. Chalet is off to a good start in rehab. She was able to work at an average MET level of 2 METs during her first two weeks in rehab. She also was able to walk up to 32 laps on the track. She was able to follow her initial exercise prescription on the seated machines as well. We will continue to monitor her progress in the program. Maddy continues to do well in rehab, however, she missed a few sessions as she was in the hospital for the flu. She started to ease back into her exercise and tried out the T5 at level 3. She also went down on her Biostep level but will remind patient to increase it gradually over time. She is not quite hitting her THR but RPEs are appropriate and dyspneas but reaching up to a 3. We will continue to monitor as she starts to feel better. Bentlee is doing well in rehab. She recently increased her overall MET level to 2.33 METs. She also improved to level 4 on the T4 and level 2.1 on the arm crank. She did walk a few less laps since the last review going from 32 laps to 28 laps. We will continue to monitor her progress in the program.    Expected Outcomes Short: Use RPE daily to regulate intensity. Long: Follow program prescription in THR. Short: Continue to follow current exercise prescription. Long: Continue to improve strength and stamina. Short: Increase Biostep level, ease back into walking Long: Continue to increase overall MEt level Short: Push for more laps on the track. Long: Continue to improve strength and  stamina.             Discharge Exercise Prescription (Final Exercise Prescription Changes):  Exercise Prescription Changes - 11/18/22 1500       Response to Exercise   Blood Pressure (Admit) 110/60    Blood Pressure (Exercise) 128/66    Blood Pressure (Exit) 102/62    Heart Rate (Admit) 87 bpm    Heart Rate (Exercise) 98 bpm    Heart Rate (Exit) 85 bpm    Oxygen Saturation (Admit) 97 %    Oxygen Saturation (Exercise) 97 %    Oxygen Saturation (Exit) 99 %    Rating of Perceived Exertion (Exercise) 14    Perceived Dyspnea (Exercise) 2    Symptoms SOB    Duration Continue with 30 min of aerobic exercise without signs/symptoms of physical distress.    Intensity THRR unchanged      Progression   Progression Continue to progress workloads to maintain intensity without signs/symptoms of physical distress.    Average METs 2.33      Resistance Training   Training Prescription Yes    Weight 3 lb    Reps 10-15      Interval Training  Interval Training No      NuStep   Level 4    Minutes 15      Arm Ergometer   Level 2.1    Minutes 15    METs 2      T5 Nustep   Level 3    Minutes 30      Track   Laps 28    Minutes 15    METs 2.52      Oxygen   Maintain Oxygen Saturation 88% or higher             Nutrition:  Target Goals: Understanding of nutrition guidelines, daily intake of sodium '1500mg'$ , cholesterol '200mg'$ , calories 30% from fat and 7% or less from saturated fats, daily to have 5 or more servings of fruits and vegetables.  Education: All About Nutrition: -Group instruction provided by verbal, written material, interactive activities, discussions, models, and posters to present general guidelines for heart healthy nutrition including fat, fiber, MyPlate, the role of sodium in heart healthy nutrition, utilization of the nutrition label, and utilization of this knowledge for meal planning. Follow up email sent as well. Written material given at  graduation.   Biometrics:  Pre Biometrics - 09/30/22 1104       Pre Biometrics   Height 5' 2.1" (1.577 m)    Weight 169 lb (76.7 kg)    Waist Circumference 38 inches    Hip Circumference 40 inches    Waist to Hip Ratio 0.95 %    BMI (Calculated) 30.82    Single Leg Stand 0.9 seconds              Nutrition Therapy Plan and Nutrition Goals:  Nutrition Therapy & Goals - 09/30/22 0950       Nutrition Therapy   Diet Heart healthy, low Na, pulmonary and T2DM MNT    Protein (specify units) 90-100g    Fiber 25 grams    Whole Grain Foods 3 servings    Saturated Fats 14 max. grams    Fruits and Vegetables 8 servings/day    Sodium 2 grams      Personal Nutrition Goals   Nutrition Goal ST: include protein, fiber, and healthy fats when eating. Consider adding in nutrition shake such as ensure or boost. Add at least 2 meals/snacks LT: meet energy and protein needs, include at least 25g of fiber per day, limit Na <2g/day, limit saturated fat <14g/day, maintain A1C <7.    Comments 57 y.o. F admitted to pulmonary rehab for centrilobular emphysema. PMHx includes HTN, HLD, T2DM, arthritis, asthma, ataxia, COPD, depression, migraines, GERD, early satiety (2018). Relevant medications includes valium, nexium, prednisone, xanax, vyvanse, breo ellipta. Cassia does not have teeth or dentures at this time and reports that she is unable to get them as she went too long without them - she reports being able to eat most foods, reviewed considerations to help with eating and swallowing. Dellene reports having a very low appetite and forces herself to eat 1 small meal (about a fistfull of food). She reports that she is unsure why, but her weight has been trending upward: discussed body compensatory mechanisms with low intake of food as well as fluid retention. Christinna reports drinking coffee with french vanilla creamer and regular mountain dew during the day, she does not like diet soda and feels that water  makes her bloat - discussed nutrition considerations with sugar sweetened beverages. Prescious reports that this is new to her and doesn't have much knowledge on  nutrition - discussed heart healthy eating, pulmonary MNT, and T2DM MNT; discussed while MyPlate guidelines may be hard with her small portions, that she should include fiber, healthy fat, and protein when she eats and reviewed foods to include and consider in each group. Discussed mechanical eating (eating outside of hunger) by adding in at least 2 more meals/snacks as well as consider adding in a nutritional shake such as ensure of boost at least 1x/day to help meet her needs while she includes more meals/snacks to help fill gaps in nutrition and to ensure she is meeting her nutritional needs. Eriana is open to making changes, but reports her biggest barrier will be the mechanical eating of foods - encouraged her that making changes can take time and it is about progress, not perfection.      Intervention Plan   Intervention Prescribe, educate and counsel regarding individualized specific dietary modifications aiming towards targeted core components such as weight, hypertension, lipid management, diabetes, heart failure and other comorbidities.;Nutrition handout(s) given to patient.    Expected Outcomes Short Term Goal: Understand basic principles of dietary content, such as calories, fat, sodium, cholesterol and nutrients.;Short Term Goal: A plan has been developed with personal nutrition goals set during dietitian appointment.;Long Term Goal: Adherence to prescribed nutrition plan.             Nutrition Assessments:  MEDIFICTS Score Key: ?70 Need to make dietary changes  40-70 Heart Healthy Diet ? 40 Therapeutic Level Cholesterol Diet  Flowsheet Row Pulmonary Rehab from 09/30/2022 in [facility] Cardiac and Pulmonary Rehab  Picture Your Plate Total Score on Admission 55      Picture Your Plate Scores: <07 Unhealthy dietary pattern with  much room for improvement. 41-50 Dietary pattern unlikely to meet recommendations for good health and room for improvement. 51-60 More healthful dietary pattern, with some room for improvement.  >60 Healthy dietary pattern, although there may be some specific behaviors that could be improved.   Nutrition Goals Re-Evaluation:  Nutrition Goals Re-Evaluation     [place] Name 11/01/22 0941             Goals   Current Weight 162 lb (73.5 kg)       Nutrition Goal Eat more foods.       Comment Twylia states that her appetite is not good and sometimes goes days without eating.Patient was informed on why it is important to maintain a balanced diet when dealing with Respiratory issues. Explained that it takes a lot of energy to breath and when they are short of breath often they will need to have a good diet to help keep up with the calories they are expending for breathing.       Expected Outcome Short: Choose and plan snacks accordingly to patients caloric intake to improve breathing. Long: Maintain a diet independently that meets their caloric intake to aid in daily shortness of breath.                Nutrition Goals Discharge (Final Nutrition Goals Re-Evaluation):  Nutrition Goals Re-Evaluation - 11/01/22 0941       Goals   Current Weight 162 lb (73.5 kg)    Nutrition Goal Eat more foods.    Comment Shatika states that her appetite is not good and sometimes goes days without eating.Patient was informed on why it is important to maintain a balanced diet when dealing with Respiratory issues. Explained that it takes a lot of energy to breath and when they are short  of breath often they will need to have a good diet to help keep up with the calories they are expending for breathing.    Expected Outcome Short: Choose and plan snacks accordingly to patients caloric intake to improve breathing. Long: Maintain a diet independently that meets their caloric intake to aid in daily shortness of breath.              Psychosocial: Target Goals: Acknowledge presence or absence of significant depression and/or stress, maximize coping skills, provide positive support system. Participant is able to verbalize types and ability to use techniques and skills needed for reducing stress and depression.   Education: Stress, Anxiety, and Depression - Group verbal and visual presentation to define topics covered.  Reviews how body is impacted by stress, anxiety, and depression.  Also discusses healthy ways to reduce stress and to treat/manage anxiety and depression.  Written material given at graduation. Flowsheet Row Pulmonary Rehab from 11/20/2022 in [facility] Cardiac and Pulmonary Rehab  Date 10/30/22  Educator [facility]  Instruction Review Code 1- [employer] Understanding       Education: Sleep Hygiene -Provides group verbal and written instruction about how sleep can affect your health.  Define sleep hygiene, discuss sleep cycles and impact of sleep habits. Review good sleep hygiene tips.    Initial Review & Psychosocial Screening:  Initial Psych Review & Screening - 09/23/22 1027       Initial Review   Current issues with None Identified      Family Dynamics   Good Support System? Yes   daughter     Barriers   Psychosocial barriers to participate in program There are no identifiable barriers or psychosocial needs.      Screening Interventions   Interventions Encouraged to exercise;To provide support and resources with identified psychosocial needs;Provide feedback about the scores to participant    Expected Outcomes Short Term goal: Utilizing psychosocial counselor, staff and physician to assist with identification of specific Stressors or current issues interfering with healing process. Setting desired goal for each stressor or current issue identified.;Long Term Goal: Stressors or current issues are controlled or eliminated.;Short Term goal: Identification and review with participant of any  Quality of Life or Depression concerns found by scoring the questionnaire.;Long Term goal: The participant improves quality of Life and PHQ9 Scores as seen by post scores and/or verbalization of changes             Quality of Life Scores:  Scores of 19 and below usually indicate a poorer quality of life in these areas.  A difference of  2-3 points is a clinically meaningful difference.  A difference of 2-3 points in the total score of the Quality of Life Index has been associated with significant improvement in overall quality of life, self-image, physical symptoms, and general health in studies assessing change in quality of life.  PHQ-9: Review Flowsheet       11/01/2022 10/30/2022 09/30/2022 04/16/2017  Depression screen PHQ 2/9  Decreased Interest 3 0 0 0  Down, Depressed, Hopeless 3 0 0 0  PHQ - 2 Score 6 0 0 0  Altered sleeping 3 3 0 -  Tired, decreased energy '3 3 3 '$ -  Change in appetite '3 3 3 '$ -  Feeling bad or failure about yourself  '3 3 3 '$ -  Trouble concentrating '3 3 3 '$ -  Moving slowly or fidgety/restless 3 0 0 -  Suicidal thoughts 0 0 0 -  PHQ-9 Score '24 15 12 '$ -  Difficult doing work/chores Extremely dIfficult Somewhat difficult Extremely dIfficult -   Interpretation of Total Score  Total Score Depression Severity:  1-4 = Minimal depression, 5-9 = Mild depression, 10-14 = Moderate depression, 15-19 = Moderately severe depression, 20-27 = Severe depression   Psychosocial Evaluation and Intervention:  Psychosocial Evaluation - 09/23/22 1037       Psychosocial Evaluation & Interventions   Interventions Encouraged to exercise with the program and follow exercise prescription    Comments Shaunika has no barriers to attending the program. She hopes to see improvement in her shortness of breath symptoms with exertion. She lives alone. Her daughter is her support person.  She has been diagnosed with diabetes and is diet controlled at this moment. She has another A1C scheduled in  Dec. She is ready to start the program and progress her exercise prescription, learn energy conservation and improve her stamina.    Expected Outcomes STG Allison will attend all scheduled sessions, she will meet with our LDN to review safe ways to work on weight loss and help with diabetes control. She will begin an exercise progression.  LTG Keianna will continue to utilize the tips for exercise, breathing and risk factor control after discharge from the program.    Continue Psychosocial Services  Follow up required by staff             Psychosocial Re-Evaluation:  Psychosocial Re-Evaluation     [place] Name 11/01/22 0935             Psychosocial Re-Evaluation   Current issues with Current Depression;Current Sleep Concerns;Current Anxiety/Panic;History of Depression;Current Stress Concerns       Comments Reviewed patient health questionnaire (PHQ-9) with patient for follow up. Previously, patients score indicated signs/symptoms of depression.  Reviewed to see if patient is improving symptom wise while in program.  Score declined and patient states that it is because she is stressedx with her health. Her stressers are her kids and she issues with her daughter. Her daughter has three children and has taken over her house. She is paying the bills and not staying at her own home. Informed her to seek help with a therapist or the doctor for medication for her mental well being.       Expected Outcomes Short: talk to doctor about possible nmedication. Long: maintain a positive mental state.       Interventions Encouraged to attend Pulmonary Rehabilitation for the exercise;Stress management education;Relaxation education       Continue Psychosocial Services  Follow up required by staff                Psychosocial Discharge (Final Psychosocial Re-Evaluation):  Psychosocial Re-Evaluation - 11/01/22 0935       Psychosocial Re-Evaluation   Current issues with Current Depression;Current Sleep  Concerns;Current Anxiety/Panic;History of Depression;Current Stress Concerns    Comments Reviewed patient health questionnaire (PHQ-9) with patient for follow up. Previously, patients score indicated signs/symptoms of depression.  Reviewed to see if patient is improving symptom wise while in program.  Score declined and patient states that it is because she is stressedx with her health. Her stressers are her kids and she issues with her daughter. Her daughter has three children and has taken over her house. She is paying the bills and not staying at her own home. Informed her to seek help with a therapist or the doctor for medication for her mental well being.    Expected Outcomes Short: talk to doctor about possible nmedication. Long:  maintain a positive mental state.    Interventions Encouraged to attend Pulmonary Rehabilitation for the exercise;Stress management education;Relaxation education    Continue Psychosocial Services  Follow up required by staff             Education: Education Goals: Education classes will be provided on a weekly basis, covering required topics. Participant will state understanding/return demonstration of topics presented.  Learning Barriers/Preferences:   General Pulmonary Education Topics:  Infection Prevention: - Provides verbal and written material to individual with discussion of infection control including proper hand washing and proper equipment cleaning during exercise session. Flowsheet Row Pulmonary Rehab from 11/20/2022 in [facility] Cardiac and Pulmonary Rehab  Date 09/30/22  Educator [facility]  Instruction Review Code 1- Verbalizes Understanding       Falls Prevention: - Provides verbal and written material to individual with discussion of falls prevention and safety. Flowsheet Row Pulmonary Rehab from 11/20/2022 in [facility] Cardiac and Pulmonary Rehab  Date 09/23/22  Educator SB  Instruction Review Code 1- Verbalizes Understanding       Chronic Lung  Disease Review: - Group verbal instruction with posters, models, PowerPoint presentations and videos,  to review new updates, new respiratory medications, new advancements in procedures and treatments. Providing information on websites and "800" numbers for continued self-education. Includes information about supplement oxygen, available portable oxygen systems, continuous and intermittent flow rates, oxygen safety, concentrators, and Medicare reimbursement for oxygen. Explanation of Pulmonary Drugs, including class, frequency, complications, importance of spacers, rinsing mouth after steroid MDI's, and proper cleaning methods for nebulizers. Review of basic lung anatomy and physiology related to function, structure, and complications of lung disease. Review of risk factors. Discussion about methods for diagnosing sleep apnea and types of masks and machines for OSA. Includes a review of the use of types of environmental controls: home humidity, furnaces, filters, dust mite/pet prevention, HEPA vacuums. Discussion about weather changes, air quality and the benefits of nasal washing. Instruction on Warning signs, infection symptoms, calling MD promptly, preventive modes, and value of vaccinations. Review of effective airway clearance, coughing and/or vibration techniques. Emphasizing that all should Create an Action Plan. Written material given at graduation. Flowsheet Row Pulmonary Rehab from 11/20/2022 in [facility] Cardiac and Pulmonary Rehab  Education need identified 09/30/22       AED/CPR: - Group verbal and written instruction with the use of models to demonstrate the basic use of the AED with the basic ABC's of resuscitation.    Anatomy and Cardiac Procedures: - Group verbal and visual presentation and models provide information about basic cardiac anatomy and function. Reviews the testing methods done to diagnose heart disease and the outcomes of the test results. Describes the treatment choices:  Medical Management, Angioplasty, or Coronary Bypass Surgery for treating various heart conditions including Myocardial Infarction, Angina, Valve Disease, and Cardiac Arrhythmias.  Written material given at graduation.   Medication Safety: - Group verbal and visual instruction to review commonly prescribed medications for heart and lung disease. Reviews the medication, class of the drug, and side effects. Includes the steps to properly store meds and maintain the prescription regimen.  Written material given at graduation. Flowsheet Row Pulmonary Rehab from 11/20/2022 in [facility] Cardiac and Pulmonary Rehab  Date 10/09/22  Educator SB  Instruction Review Code 1- Verbalizes Understanding       Other: -Provides group and verbal instruction on various topics (see comments)   Knowledge Questionnaire Score:  Knowledge Questionnaire Score - 09/30/22 1107       Knowledge Questionnaire  Score   Pre Score 16/18              Core Components/Risk Factors/Patient Goals at Admission:  Personal Goals and Risk Factors at Admission - 09/30/22 1107       Core Components/Risk Factors/Patient Goals on Admission    Weight Management Yes;Obesity;Weight Loss    Intervention Weight Management: Develop a combined nutrition and exercise program designed to reach desired caloric intake, while maintaining appropriate intake of nutrient and fiber, sodium and fats, and appropriate energy expenditure required for the weight goal.;Weight Management: Provide education and appropriate resources to help participant work on and attain dietary goals.;Weight Management/Obesity: Establish reasonable short term and long term weight goals.;Obesity: Provide education and appropriate resources to help participant work on and attain dietary goals.    Admit Weight 169 lb (76.7 kg)    Goal Weight: Short Term 165 lb (74.8 kg)    Goal Weight: Long Term 130 lb (59 kg)    Expected Outcomes Short Term: Continue to assess and modify  interventions until short term weight is achieved;Long Term: Adherence to nutrition and physical activity/exercise program aimed toward attainment of established weight goal;Weight Loss: Understanding of general recommendations for a balanced deficit meal plan, which promotes 1-2 lb weight loss per week and includes a negative energy balance of [id number] kcal/d;Understanding recommendations for meals to include 15-35% energy as protein, 25-35% energy from fat, 35-60% energy from carbohydrates, less than '200mg'$  of dietary cholesterol, 20-35 gm of total fiber daily;Understanding of distribution of calorie intake throughout the day with the consumption of 4-5 meals/snacks    Improve shortness of breath with ADL's Yes    Intervention Provide education, individualized exercise plan and daily activity instruction to help decrease symptoms of SOB with activities of daily living.    Expected Outcomes Short Term: Improve cardiorespiratory fitness to achieve a reduction of symptoms when performing ADLs;Long Term: Be able to perform more ADLs without symptoms or delay the onset of symptoms    Increase knowledge of respiratory medications and ability to use respiratory devices properly  Yes    Intervention Provide education and demonstration as needed of appropriate use of medications, inhalers, and oxygen therapy.    Expected Outcomes Short Term: Achieves understanding of medications use. Understands that oxygen is a medication prescribed by physician. Demonstrates appropriate use of inhaler and oxygen therapy.;Long Term: Maintain appropriate use of medications, inhalers, and oxygen therapy.    Diabetes Yes    Intervention Provide education about signs/symptoms and action to take for hypo/hyperglycemia.;Provide education about proper nutrition, including hydration, and aerobic/resistive exercise prescription along with prescribed medications to achieve blood glucose in normal ranges: Fasting glucose 65-99 mg/dL     Expected Outcomes Short Term: Participant verbalizes understanding of the signs/symptoms and immediate care of hyper/hypoglycemia, proper foot care and importance of medication, aerobic/resistive exercise and nutrition plan for blood glucose control.;Long Term: Attainment of HbA1C < 7%.    Hypertension Yes    Intervention Provide education on lifestyle modifcations including regular physical activity/exercise, weight management, moderate sodium restriction and increased consumption of fresh fruit, vegetables, and low fat dairy, alcohol moderation, and smoking cessation.;Monitor prescription use compliance.    Expected Outcomes Short Term: Continued assessment and intervention until BP is < 140/73m HG in hypertensive participants. < 130/86mHG in hypertensive participants with diabetes, heart failure or chronic kidney disease.;Long Term: Maintenance of blood pressure at goal levels.             Education:Diabetes - Individual verbal and written  instruction to review signs/symptoms of diabetes, desired ranges of glucose level fasting, after meals and with exercise. Acknowledge that pre and post exercise glucose checks will be done for 3 sessions at entry of program. Flowsheet Row Pulmonary Rehab from 11/20/2022 in [facility] Cardiac and Pulmonary Rehab  Date 09/30/22  Educator [facility]  Instruction Review Code 1- Verbalizes Understanding       Know Your Numbers and Heart Failure: - Group verbal and visual instruction to discuss disease risk factors for cardiac and pulmonary disease and treatment options.  Reviews associated critical values for Overweight/Obesity, Hypertension, Cholesterol, and Diabetes.  Discusses basics of heart failure: signs/symptoms and treatments.  Introduces Heart Failure Zone chart for action plan for heart failure.  Written material given at graduation.   Core Components/Risk Factors/Patient Goals Review:   Goals and Risk Factor Review     Row Name 11/01/22 [phone]              Core Components/Risk Factors/Patient Goals Review   Personal Goals Review Improve shortness of breath with ADL's       Review Spoke to patient about their shortness of breath and what they can do to improve. Patient has been informed of breathing techniques when starting the program. Patient is informed to tell staff if they have had any med changes and that certain meds they are taking or not taking can be causing shortness of breath.       Expected Outcomes Short: Attend [facility] regularly to improve shortness of breath with ADL's. Long: maintain independence with ADL's                Core Components/Risk Factors/Patient Goals at Discharge (Final Review):   Goals and Risk Factor Review - 11/01/22 0943       Core Components/Risk Factors/Patient Goals Review   Personal Goals Review Improve shortness of breath with ADL's    Review Spoke to patient about their shortness of breath and what they can do to improve. Patient has been informed of breathing techniques when starting the program. Patient is informed to tell staff if they have had any med changes and that certain meds they are taking or not taking can be causing shortness of breath.    Expected Outcomes Short: Attend [facility] regularly to improve shortness of breath with ADL's. Long: maintain independence with ADL's             ITP Comments:  ITP Comments     Row Name 09/23/22 1048 09/30/22 1058 10/07/22 0955 10/23/22 1034 11/20/22 0956   ITP Comments Virtual orientation call completed today. shehas an appointment on Date: 09/30/2022  for EP eval and gym Orientation.  Documentation of diagnosis can be found in [facility] Date: 09/11/2022 . Completed 6MWT and gym orientation. Initial ITP created and sent for review to Dr. Zetta Bills, Medical Director. First full day of exercise!  Patient was oriented to gym and equipment including functions, settings, policies, and procedures.  Patient's individual exercise prescription and  treatment plan were reviewed.  All starting workloads were established based on the results of the 6 minute walk test done at initial orientation visit.  The plan for exercise progression was also introduced and progression will be customized based on patient's performance and goals. 30 Day review completed. Medical Director ITP review done, changes made as directed, and signed approval by Medical Director.     new to prgram 30 Day review completed. Medical Director ITP review done, changes made as directed, and signed approval by  Medical Director.            Comments:

## 2022-11-20 NOTE — Progress Notes (Signed)
Daily Session Note  Patient Details  Name: Lauren Escobar MRN: [id number] Date of Birth: 1966-04-07 Referring Provider:   Flowsheet Row Pulmonary Rehab from 09/30/2022 in [facility] Cardiac and Pulmonary Rehab  Referring Provider Armando Reichert MD       Encounter Date: 11/20/2022  Check In:  Session Check In - 11/20/22 0930       Check-In   Supervising physician immediately available to respond to emergencies See telemetry face sheet for immediately available ER MD    Location [facility]-Cardiac & Pulmonary Rehab    Staff Present Darlyne Russian, RN, ADN;Joseph Tessie Fass, RCP,RRT,BSRT;Noah Tickle, BS, Exercise Physiologist    Virtual Visit No    Medication changes reported     No    Fall or balance concerns reported    No    Warm-up and Cool-down Performed on first and last piece of equipment    Resistance Training Performed Yes    VAD Patient? No    PAD/SET Patient? No      Pain Assessment   Currently in Pain? No/denies                Social History   Tobacco Use  Smoking Status Former   Packs/day: 2.75   Years: 29.00   Total pack years: 79.75   Types: Cigarettes   Quit date: 10/2020   Years since quitting: 2.0  Smokeless Tobacco Never    Goals Met:  Independence with exercise equipment Exercise tolerated well No report of concerns or symptoms today Strength training completed today  Goals Unmet:  Not Applicable  Comments: Pt able to follow exercise prescription today without complaint.  Will continue to monitor for progression.    Dr. Emily Filbert is Medical Director for [place].  Dr. Ottie Glazier is Medical Director for [facility] Pulmonary Rehabilitation.

## 2022-11-22 ENCOUNTER — Encounter: Payer: 59 | Admitting: *Deleted

## 2022-11-22 DIAGNOSIS — J432 Centrilobular emphysema: Secondary | ICD-10-CM | POA: Diagnosis not present

## 2022-11-22 DIAGNOSIS — R0602 Shortness of breath: Secondary | ICD-10-CM

## 2022-11-22 NOTE — Progress Notes (Signed)
Daily Session Note  Patient Details  Name: Lauren Escobar MRN: [id number] Date of Birth: 01/27/1966 Referring Provider:   Flowsheet Row Pulmonary Rehab from 09/30/2022 in [facility] Cardiac and Pulmonary Rehab  Referring Provider Armando Reichert MD       Encounter Date: 11/22/2022  Check In:  Session Check In - 11/22/22 1015       Check-In   Supervising physician immediately available to respond to emergencies See telemetry face sheet for immediately available ER MD    Location [facility]-Cardiac & Pulmonary Rehab    Staff Present Heath Lark, RN, BSN, CCRP;Jessica Chester, MA, RCEP, CCRP, CCET;Joseph [place], [place]    Virtual Visit No    Medication changes reported     No    Fall or balance concerns reported    No    Warm-up and Cool-down Performed on first and last piece of equipment    Resistance Training Performed Yes    VAD Patient? No    PAD/SET Patient? No      Pain Assessment   Currently in Pain? No/denies                Social History   Tobacco Use  Smoking Status Former   Packs/day: 2.75   Years: 29.00   Total pack years: 79.75   Types: Cigarettes   Quit date: 10/2020   Years since quitting: 2.0  Smokeless Tobacco Never    Goals Met:  Proper associated with RPD/PD & O2 Sat Independence with exercise equipment Exercise tolerated well No report of concerns or symptoms today  Goals Unmet:  Not Applicable  Comments: Pt able to follow exercise prescription today without complaint.  Will continue to monitor for progression.    Dr. Emily Filbert is Medical Director for [place].  Dr. Ottie Glazier is Medical Director for [facility] Pulmonary Rehabilitation.

## 2022-11-25 ENCOUNTER — Encounter: Payer: 59 | Admitting: *Deleted

## 2022-11-25 ENCOUNTER — Ambulatory Visit: Admit: 2022-11-25 | Discharge: 2022-11-26 | Payer: MEDICARE

## 2022-11-25 DIAGNOSIS — J432 Centrilobular emphysema: Secondary | ICD-10-CM | POA: Diagnosis not present

## 2022-11-25 DIAGNOSIS — R0602 Shortness of breath: Secondary | ICD-10-CM

## 2022-11-25 DIAGNOSIS — G8929 Other chronic pain: Principal | ICD-10-CM

## 2022-11-25 DIAGNOSIS — N3946 Mixed incontinence: Principal | ICD-10-CM

## 2022-11-25 DIAGNOSIS — R152 Fecal urgency: Principal | ICD-10-CM

## 2022-11-25 DIAGNOSIS — R52 Pain, unspecified: Principal | ICD-10-CM

## 2022-11-25 DIAGNOSIS — N941 Unspecified dyspareunia: Principal | ICD-10-CM

## 2022-11-25 DIAGNOSIS — R310 Gross hematuria: Principal | ICD-10-CM

## 2022-11-25 DIAGNOSIS — M545 Chronic bilateral low back pain, unspecified whether sciatica present: Principal | ICD-10-CM

## 2022-11-25 DIAGNOSIS — R159 Full incontinence of feces: Principal | ICD-10-CM

## 2022-11-25 DIAGNOSIS — R1084 Generalized abdominal pain: Principal | ICD-10-CM

## 2022-11-25 DIAGNOSIS — R102 Pelvic and perineal pain: Principal | ICD-10-CM

## 2022-11-25 DIAGNOSIS — N9489 Other specified conditions associated with female genital organs and menstrual cycle: Principal | ICD-10-CM

## 2022-11-25 MED ORDER — DIAZEPAM 5 MG TABLET
ORAL_TABLET | 2 refills | 0 days | Status: CP
Start: 2022-11-25 — End: ?

## 2022-11-25 NOTE — Progress Notes (Signed)
Daily Session Note  Patient Details  Name: Lauren Escobar MRN: [id number] Date of Birth: [birth year] Referring Provider:   Flowsheet Row Pulmonary Rehab from 09/30/2022 in [facility] Cardiac and Pulmonary Rehab  Referring Provider Armando Reichert MD       Encounter Date: 11/25/2022  Check In:  Session Check In - 11/25/22 0959       Check-In   Supervising physician immediately available to respond to emergencies See telemetry face sheet for immediately available ER MD    Location [facility]-Cardiac & Pulmonary Rehab    Staff Present Darlyne Russian, RN, Doyce Para, BS, ACSM CEP, Exercise Physiologist;Noah Tickle, BS, Exercise Physiologist    Virtual Visit No    Medication changes reported     No    Fall or balance concerns reported    No    Warm-up and Cool-down Performed on first and last piece of equipment    Resistance Training Performed Yes    VAD Patient? No    PAD/SET Patient? No      Pain Assessment   Currently in Pain? No/denies                Social History   Tobacco Use  Smoking Status Former   Packs/day: 2.75   Years: 29.00   Total pack years: 79.75   Types: Cigarettes   Quit date: 10/2020   Years since quitting: 2.0  Smokeless Tobacco Never    Goals Met:  Independence with exercise equipment Exercise tolerated well No report of concerns or symptoms today Strength training completed today  Goals Unmet:  Not Applicable  Comments: Pt able to follow exercise prescription today without complaint.  Will continue to monitor for progression.    Dr. Emily Filbert is Medical Director for [place].  Dr. Ottie Glazier is Medical Director for [facility] Pulmonary Rehabilitation.

## 2022-11-25 NOTE — Unmapped (Addendum)
For bowel leakage  Start with psyllium daily. Start with 1 tsp and increase as tolerated.  Pelvic floor physical therapy can help you with bowel control as well.     For urinary leakage  Avoid bladder irritants such as soda and coffee  Avoid drinking over 40-60 oz daily  Consider Hint, which is a water supplement that does not contain bladder irritants    Pelvic floor physical therapy can help you with bladder control as well    Blood in urine    You need a repeat cystoscopy - call the urology clinic up at [facility] to see if they can get this done sooner. Otherwise we will schedule an appointment.     For chronic pelvic pain     Chronic pelvic pain (CPP) is any pelvic pain that lasts for more than six months and occurs in the pelvis or lower abdomen. Sometimes the cause of the pain is not obvious. At other times, the problem, which originally caused the pain, has lessened or even gone away completely, but the pain continues. Chronic pelvic pain is one of the most common health care problems in our society. It is estimated that 25 million women suffer with chronic pelvic pain. Approximately 25% of women with CPP may spend 2-3 days in bed each month. More than half of the women with CPP must cut down on their daily activities 1 or more days a month and 90% have pain with intercourse (sex). Almost half of the women with CPP feel sad or depressed some of the time.      Usually, CPP is not caused by a single problem but by a number of problems interacting together. This means that many times there is no single ???treatment??? for CPP. You may need several treatments for all the problems. For a treatment to be effective, the treatment needs to treat the body and the mind. See handout below.       TODAY we have planned to start the following for treatment of your symptoms  Pelvic floor physical therapy at [place]  Vaginal valium 5 mg. Place inside the vagina before going to bed for 6 weeks. Once you are improving you can taper off the medication and just use as needed for worsening flares of pain. Do not use gel to place the tablets; instead place 1-2 drops of water on the table before inserting it in the vagina. Side effects include sedation, so use with caution and monitor for sedation. May be used up to three times a day if you are not experiencing side effects. This may also be cut in half if needed to decrease side effects. If they are not dissolving well, this can also come in suppository form (which can be ordered through a compounding pharmacy)    Referral to the chronic pelvic pain clinic with our minimally invasive gynecology specialists regarding for your pelvic pain. They will be able to evaluate if other treatments (such as pelvic floor injections) or other surgeries may benefit you with regards to your pelvic pain.   Referral to pain management, as you have pain in your back and abdomen as well.     Do not be discouraged if improvements are not noticed immediately.  It can take a few weeks to reach the maximum effect.  Monitor for side effects and if they are too bothersome, stop the medication and let your doctor know.    Pelvic floor dysfunction is a condition where the pelvic floor muscles are  contracted (tightened), weak, or not well coordinated (does not function as it should).     You were also found to have pelvic floor muscle spasm (tight, contracted pelvic floor muscles) on examination today.     What is Pelvic Floor Muscle Spasm?    The muscles at the base of the pelvic floor are called the levators. They are two large sling-like muscles that suspend from the pubic bone in the front to the tail bone and hold everything up that is contained in the pelvic area. Sometimes, these muscles will go into a spasm and cause very acute pain. Women often describe this pain as a heavy pressure feeling, almost as though something may fall out. The spasm can sometimes create a sharp stabbing pain in the vagina, pain across the lower pelvis and low back, pain passing bowel movements and/or pain with sexual activity. This pain tends to get worse with prolonged sitting or standing, leaving the individual with increased discomforts as the day wears on. This can cause or contribute to pelvic pain, pelvic pressure, urinary symptoms, bowel symptoms, worsening prolapse.    Spasm of the pelvic floor muscles may occur in response to some other pain trigger such as a ruptured cyst or endometriosis. It is very common after pelvic surgery. Sometimes, it just happens and we don't know the cause.        Other things that can help symptoms are   Stress reduction: Some patients find yoga or meditation very helpful. Regular exercise can also be helpful.    Avoiding bladder irritants:   Avoiding drinks that irritate the bladder and make you want to go to the toilet more often. These drinks include alcohol and drinks with caffeine and/or artificial sweeteners such as coffee, tea, sweet tea, sodas, diet sodas, chocolate drinks, and carbonated energy drinks; try eliminating/avoiding these drinks and see if your symptoms improve.    Avoiding dietary triggers: see the table below. See if these foods are a trigger for your symptoms.   Consider adding Prelief, an over the counter supplement to de-acidify foods  Low oxylate diet may help: https://wilkins.com/.     Ice packs/heat packs: Applying heat or cold packs over the bladder or perineum may help.      Avoiding too much or too little fluid intake     If you are medically allowed to take these medicines, other over-the-counter medications, such as acetaminophen/Tylenol, regularly over the course of 24 hours may be helpful, particularly during flares     Azo (phenazopyridium) or Uribel may be helpful for bladder pain. Use sparingly if you have kidney issues    Mindfulness practice (yoga, meditation, deep breathing) Avoid constipation by using daily fiber supplement such as Metamucil (start with 1 tsp) or Miralax (start with 1 cap daily and increase as needed).         Chronic Pelvic Pain Handout    Chronic pelvic pain (CPP) is any pelvic pain that lasts for more than six months and occurs in the pelvis or lower abdomen. Sometimes the cause of the pain is not obvious. At other times, the problem, which originally caused the pain, has lessened or even gone away completely, but the pain continues. Chronic pelvic pain is one of the most common health care problems in our society. It is estimated that 25 million women suffer with chronic pelvic pain. Approximately 25% of women with CPP may spend 2-3 days in bed each month. More than half of the women with CPP must  cut down on their daily activities 1 or more days a month and 90% have pain with intercourse (sex). Almost half of the women with CPP feel sad or depressed some of the time.    What is the difference between ???acute??? and ???chronic??? pain?     Acute pain is the pain, which occurs when the body is injured, as in the case of infection of the appendix (appendicitis). There is an obvious cause for the pain. Chronic pain is very different. The original cause of the pain may be gone. This is caused by changes in the muscles, nervous system or other tissues. The pain itself becomes the disease.    When constant, strong pain continues for a long period of time, it can become physically and mentally exhausting. To cope with the pain, the body may make emotional, neurological, and behavioral changes. When pain has continued for so long and such an extent that the person in pain is changing emotionally and behaving differently to cope with it, this is known as ???Chronic Pelvic Pain Syndrome???.     Can CPP affect other parts of my body?     A person who has had CPP long-term may notice symptoms in other muscles and organs of the body as well. It is common for pain to cause muscle tension. CPP sufferers may notice lasting changes in the muscles that affect the bladder and the bowel. Patients also may notice pain involving the back and legs due to muscle and nerve involvement. Once these problems have started, they may become more painful and troublesome than the pelvic pain, which started them. Health care providers who specialize in treating chronic pelvic pain will examine all your organ systems (e.g. bladder bowel), not just your reproductive organs (e.g. uterus, ovaries, penis or scrotum).     How do I feel pain?     Injured body tissues send signals through special nerve cells to your spinal cord. The spinal cord acts like a gate. It can let the signals pass to the brain, stop the signals or change them, making them stronger or weaker. What action the spinal cord takes is influenced by other nerve messages coming in at the same time and by signals coming down from the brain. So, how you perceive pain is affected by your mood, by the environment and by other processes happening in your body at the time. When a person has chronic, long-lasting pain, the spinal cord ???gate??? may be damaged. This may cause the ???gate??? to remain open even after the injured tissue is healing. When this happens, the pain remains in spite of treating the original cause.    What can cause chronic pelvic pain?   There are four main factors:     1) Pathology at the site of origin. There is or was an injury (pathology) at the place (site of origin) where the pain first started. This injury might be infection, trauma, a tumor, or adhesions in an specific organ such as the bladder or bowel. Persons with female reproductive organs may have uterine or pelvic conditions such as endometriosis, ovarian cysts, infections. Abdominal conditions like constipation, IBS, or other issues may also cause pelvic pain. Additionally, urinary problems like interstitial cystitis/painful bladder syndrome, infections, kidney stones could cause pelvic pain. Additionally, musculoskeletal injuries could cause pelvic pain.    2) Referred Pain. Your body has two types of nerves. Visceral nerves carry impulses from the organs and structures within your abdomen and chest (stomach, intestines, lungs,  heart etc.). Somatic nerves bring messages from the skin and muscles. Both types of nerves travel to the same sites on the spinal cord. When your visceral nerves are stimulated for long periods with chronic, ongoing pain, some of this stimulation may spill over into the somatic nerves, which then carry the pain back to the muscles and skin. In CPP, the somatic nerves may carry the pain back to your pelvic and abdominal muscles and skin. That means that your pain may start in your bladder and spread to your skin and muscles, or the other way around.     3) Trigger points are specific areas of tenderness occurring in the muscle wall of the pelvis or abdomen. Trigger points may start out as just one symptom of your pelvic pain or they may be the major source of pain for you. For this reason, treating the trigger points, for some people, may significantly reduce the pain. For others, the original source of injury as well as the trigger points must be treated.     4) Action of the Brain. Your brain influences your emotions and behavior. It also interacts with your spinal cord and affects how you feel the visceral and referred pain. For instance, if you are depressed, your brain will allow more pain signals to cross the gates of the spinal cord, and you will feel more pain. This influence or modulation by the brain must also be treated. Treatment can include psychological counseling, physical therapy and medications.     It is important to remember that all of these 4 levels of pain must be treated together for CPP therapy to be successful    There are a number of things you can do to help your health care provider diagnose and treat you:      Get copies of your medical records, including health care provider visits, lab tests, x-rays and surgical testing.    If you have had surgeries, records of the surgical treatments, including videotapes are very helpful.    Carefully fill out the health care provider???s questionnaire. Take your time and try to remember all the details and the order in which they happened. Just filling out the questionnaire may help you remember details you had forgotten. Also, it may easier to write out personal information that is difficult or embarrassing to talk about. Remember that the more information you give the health care provider, the easier it will be for them to help you.   Factors which may be very important in your care are   o How and when did your pain begin?   o What actions or activities make it better or worse?   o Does it vary based on time of day, week or month?   o How does your menstrual cycle affect the pain?   o How does the pain affect your sleep?   o Has the pain spread since it began?   o Do you notice abnormal skin sensations (pain, itching, burning), muscle or joint pain or back pain?   o Do you have pain with urination (peeing), constipation, diarrhea or other problems with your bowels?   o Has the pain caused emotional changes like anxiety or depression?   o What have you done to relieve the pain? What has worked? What has not worked?   o What medical treatments have you had? Have they helped?   o What medications have you used in the past? What medications are you taking now?  o What do you think is causing your pain?   o What concerns you most about your pain?    What factors will my health care provider consider when deciding how to help me?     Your health care provider will consider several factors in deciding how best to treat your pain. Sometimes, when you???ve had pain for a long time it starts to feel like the pain is all in your mind! Pain is mediated by the nervous system, which includes the body and the brain. The pain is not all in your body, but it is not all in your head either!     For a treatment to be effective, the treatment needs to treat the body and the mind. Usually, CPP is not caused by a single problem but by a number of problems interacting together. This means that many times there is no single ???treatment??? for CPP. You may need several treatments for all the problems.     It is impossible to tell how much each individual pain factor adds to the whole problem. In fact, whatever caused your pain in the first place may become only a minor factor while the chronic pain is caused by secondary factors. Therefore, ALL factors must be treated, not just the ones that ???seem??? the most important.     How soon will I start to feel better?     It may take a long time before you start feeling better, even though your health care provider is trying to provide you with relief as quickly as possible. It took a long time for your pain to become bad and may take weeks or months for it to improve. During your treatment, as you are slowly improving, try to remain calm and patient and keep a positive attitude.    What therapies are available to treat pelvic pain:    Therapies include, but aren't limited to:   Physical therapy consultation for pelvic floor physical therapy or other musculoskeletal treatments  Ice packs and heat backs  Stress reduction  Psychiatric or psychologic therapy to help with anxiety, depression, and pain coping techniques  Urology consultation  Pain management clinic consultations  Medications  The therapies for treatment of CPP take time to work and medication will keep you comfortable until they can take effect.  Pain medications may not take all of your pain away but may make your symptoms more bearable. All medications can have side effects, especially opioids which are actually not recommended for the long-term treatment of chronic pain. Your health care provider will probably prefer to try non-narcotic pain relievers first to avoid potential drug side effects.  Some medications will act locally, and some will act on the whole body. You may be given a combination of medications instead of one. Often medications complement each other and are more effective if used in combination.   Some medications will centrally on the brain or on nerves if this is suspected to be a reason for some of your pain  Taking medication every time you feel pain can make you dependent on medication. Taking medication at fixed times rather than each time you have pain has been found to be more effective in pain control.     What about surgical treatments?     Depending on your individual circumstances, your health care provider may decide to do surgery to determine the cause(s) of your pain and possibly to treat them as well. If you have conditions such  as pudendal neuralgia, pelvic floor myofascial pain, endometriosis, surgery or trigger point/botulinum toxin injections procedures may be helpful. However, it is important to remember that in persons who have pelvic pain, surgery may provide a lot, some or no relief, depending on other factors that may affect pain but are not necessarily amendable to surgery.    So???what can I expect from treatment for CPP?     First off, you need to be realistic in your expectations and hopes for treatment. You should share your expectations and experiences with your health care provider and you should be part of the decision-making process when selecting treatment.     However, it is important to remember that some CPP can never be completely cured and instead your provider may focus on improvements in quality of life and function instead of pain.     This may involve learning resilience and how to cope with pain flares. Don???t expect instant results. Be patient with your treatment; follow all your health care provider???s instructions.     Treatments may take up to 3-6 months to work, so continue to follow instructions even if you don???t see results right away.     During your treatment and therapies, you will have set appointments with your health care provider and therapist rather than just coming in when the pain is particularly bad.     Successful treatment means decreasing your pain to a low level so that you can enjoy doing the things you want to do again.

## 2022-11-25 NOTE — Unmapped (Signed)
[facility] Urogynecology and Reconstructive Pelvic Surgery  New Patient Evaluation & Consultation    Date: 11/25/2022     Referring Provider: Timoteo Ace Dev*  Primary Care Physician: Audrie Gallus, MD    Subjective:     Chief Complaint: hematuria, pelvic pain    History of Present Illness:  FRADEL PRIDEAUX is a 57 y.o. 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   Reports abd pain, chronic, Tylenol about 4 times a day.  She notes that the pain does not occur every day. The pain is predominantly suprapubic, R>L. No change in the pain with bowel habits. The pain wakes up from sleep. Urination causes pain to increase. She notes blood in urine. She has been treated in the past with steroids/levaquin; these have been effective. She has a h/o interstitial cystitis and even had an interstim device; she also has had cystopexy   Pt referred to urogynecology for hematuria and pelvic pain    She has been seen for these symptoms by urology in 01/12/2021. She has had longstanding UUI with a stress component since seeing urology in 2013. Previously failed Interstim due to buttocks pain.   ANDRIKA QUEVEDO is very pleasant 57 y.o.-year-old female with past medical history of gross hematuria status post negative evaluation in 2020 and prior InterStim placement removed for infection who is seen in follow-up for the evaluation of urge predominant lower urinary tract symptoms.  She is previously been followed by APP Duffy Rhody and Dr. Raoul Pitch, who recently performed a cystoscopy on her in March 2020.  Last seen by me 12/10/2019 during which time she was noted to have persistent urge predominant voiding symptoms for which she was started on mirabegron 50 mg daily.  Regarding hematuria history her last UA from 12/08/2019 did not reveal any RBCs.  She is a current smoker (1 pack/week x 30 years).  She denies anticoagulant use and does admit to past urinary tract infections.     In the interim she notes that mirabegron helped with urgency but she since ran out and has had a recurrence of her symptoms.  She denies fevers, chills, night sweats, hematuria, pyuria, dysuria, urinary retention and chest pain.  She does note chronic lower back pain and was asking if this could be related to her lower urinary tract symptoms.  Reassuringly her PVR today is 35 cc.  She continues to smoke tobacco.  UA today is without evidence of hematuria and is not suggestive of infection.    Plan:   This is a 57 y.o.-year-old female with a history of gross hematuria who returns in follow-up for bothersome lower urinary tract symptoms of unclear etiology.  Constellation of symptoms are consistent with IC/BPS, although given significant smoking history urothelial carcinoma must remain in the back of our differential.  Reassuringly her UA is without evidence of microhematuria today and she denies recent episodes of gross hematuria.     Plan:  [ ]  UA reflex culture  [ ]  Continue mirabegron 50 mg daily  [ ]  Resume Elmiron 100 mg 3 times daily either 1 hour before or 2 hours after meals given questionable history of interstitial cystitis  [ ]  Return to clinic in 3-4 months with any available APP for symptom check  [ ]  As needed referral to Dr. Raoul Pitch for discussion of  bladder Botox versus PTNS for medication refractory urge predominant LUTS\    CT A/P 01/07/22 for RLQ pain  Notable, relevant findings:  KIDNEYS/URETERS: Symmetric nephrograms. No enhancing solid renal mass lesion. No evidence of hydronephrosis.  BLADDER: Mostly decompressed, limiting evaluation.  REPRODUCTIVE ORGANS: Uterus is surgically absent. No adnexal mass lesion identified. Nonspecific small-volume gas in the vagina. Mild questionable wall thickening of the vagina.  GI TRACT: Nonvisualization of the appendix, likely surgically absent. There is no evidence of bowel thickening or obstruction. Mild colonic stool burden. Few scattered colonic diverticuli, without evidence to suggest diverticulitis.  BONES and SOFT TISSUES: No soft tissue abnormality. No worrisome osseous lesion. Multilevel degenerative changes are seen throughout spine. Well-defined sclerotic focus seen within the right lateral aspect of the L3 vertebral body, likely reflects a small bone island.    Today, 11/25/22:  She reports her doctor moved and so she is seeking care for IC. She has urgency and a dull vs sharp pain in her bladder. Feels like she has a UTI even when she doesn't. Currently taking no meds for bladder pain since her doctor left 4 years ago. Doesn't remember what was helpful or not helpful. Sometimes has bladder pain with intercourse so she avoids intercourse. Her mother had bladder cancer. She reports she had blood in her urine about one year ago but not more recently. She has quit smoking in the last year as she has developed lung disease.    Current Urinary Symptoms:      Stress Urinary Incontinence: yes, with coughing sneezing laughing   Urgency Urinary Incontinence: yes  Wearing pads: no    Frequency: yes  Nocturia: 3x    Treatments tried: see review of records      UTI: yes based on symptoms, uncertain if culture proven  Stones: no  Hematuria: yes    Drinks: 12 oz coffee, 12 oz soda, does not drink water    Pelvic Organ Prolapse Symptoms:                                             Can see and feel bulge: no    Bowel Symptom:     BMs: daily  Consistency: loose   Splinting: no  Fecal Incontinence: yes every other day, has IBS  Fecal urgency no  Straining no  Feeling of incomplete defecation no    Treatments tried: none    Past Medical History:   Diagnosis Date    Abnormal findings on esophagogastroduodenoscopy (EGD) 12/16/2013    For abdominal discomfort; DDAVP; with EUS; no abnormal findings, and pancreas unremarkable    Adult ADHD     ANA positive     Anxiety Asthma     Atony of bladder     Bipolar disorder (CMS-HCC)     Chronic interstitial cystitis     Chronic pain syndrome     COPD (chronic obstructive pulmonary disease) (CMS-HCC)     Depression     Diabetes (CMS-HCC)     Difficult intravenous access     Dyspareunia     Endometriosis     Female stress incontinence     Gastric ulcer     GERD (gastroesophageal reflux disease)     Gross hematuria     H/O colonoscopy 12/2011    Incomplete bladder emptying     Incontinence without sensory awareness  Insomnia     Other chronic cystitis     Other functional disorder of bladder     Pain medication agreement signed 08/19/2012    Polycythemia vera (CMS-HCC)     Polycythemia vera(238.4)     Scoliosis     Urge incontinence     Urinary frequency     Von Willebrand's disease (CMS-HCC)        Past Surgical History:   Procedure Laterality Date    APPENDECTOMY      BREAST BIOPSY Right     age 73     benign    HYSTERECTOMY  1999    total    InterStim Device  2003    Bladder Stimulator    OOPHORECTOMY      PR INS/RPLC [facility] SAC/GSTRC NPG/RCVR PCKT CRTJ&CONN Right 02/06/2015    Procedure: INSERTION OR REPLACEMENT PERIPHERAL/GASTRIC NEUROSTIMULATOR PULSE GENERAT/RECEIVE, DIRECT/INDUCTIV COUPLING;  Surgeon: Smith Robert, MD;  Location[facility];  Service: Urology    PR REV/RMV [facility] SAC/GSTRC NPG/RCV [facility] CONN ELTR RA N/A 09/22/2017    Procedure: REVISION OR REMOVAL OF PERIPHERAL OR GASTRIC NEUROSTIMULATOR PULSE GENERATOR OR RECEIVER;  Surgeon: Norval Morton, MD;  Location: [facility] OR [facility];  Service: Urology    PR REVJ/RMVL [facility] NEUROSTIMULATOR ELECTRODE ARRAY N/A 09/22/2017    Procedure: REVISION OR REMOVAL OF PERIPHERAL NEUROSTIMULATOR ELECTRODE ARRAY;  Surgeon: Norval Morton, MD;  Location[facility] OR [facility];  Service: Urology    ROTATOR CUFF REPAIR Left     x 2    TOTAL KNEE ARTHROPLASTY Left 2006    Given Humate-P         Current Outpatient Medications:     esomeprazole (NEXIUM) 40 MG capsule, Take 1 capsule (40 mg total) by mouth daily., Disp: , Rfl:     ferrous sulfate 325 (65 FE) MG tablet, Take 1 tablet (325 mg total) by mouth Every Tuesday, Thursday, Saturday, Sunday., Disp: 30 tablet, Rfl: 3    isosorbide mononitrate (ISMO,MONOKET) 10 MG tablet, Take 1 tablet (10 mg total) by mouth two (2) times a day., Disp: , Rfl:     propranoloL (INDERAL) 40 MG tablet, Take 1 tablet (40 mg total) by mouth in the morning., Disp: , Rfl:     ADVAIR DISKUS 250-50 mcg/dose diskus, Inhale 1 puff  in the morning and 1 puff in the evening., Disp: , Rfl:     albuterol HFA 90 mcg/actuation inhaler, , Disp: , Rfl:     aspirin (ECOTRIN) 81 MG tablet, Take 1 tablet (81 mg total) by mouth daily., Disp: 150 tablet, Rfl: 2    atorvastatin (LIPITOR) 20 MG tablet, Take 1 tablet (20 mg total) by mouth daily., Disp: 30 tablet, Rfl: 11    diazePAM (VALIUM) 5 MG tablet, Place one tablet in the vagina every night for 6 weeks, then as needed for severe pain, Disp: 30 tablet, Rfl: 2    ipratropium-albuterol (DUO-NEB) 0.5-2.5 mg/3 mL nebulizer, Inhale 3 mL by nebulization every six (6) hours as needed. (Patient not taking: Reported on 10/02/2022), Disp: 360 mL, Rfl: 11    mirtazapine (REMERON) 15 MG tablet, TAKE 1 TABLET BY MOUTH EVERY DAY AT NIGHT, Disp: 90 tablet, Rfl: 4    ruxolitinib (JAKAFI) 15 mg tablet, Take 1 tablet (15 mg total) by mouth Two (2) times a day., Disp: 60 tablet, Rfl: 11    SPIRIVA WITH HANDIHALER 18 mcg inhalation capsule, INHALE 1 CAP INHALATION ONCE DAILY (Patient not taking: Reported on  10/02/2022), Disp: , Rfl:     venlafaxine (EFFEXOR-XR) 150 MG 24 hr capsule, TAKE 2 CAPSULES (300 MG TOTAL) BY MOUTH EVERY MORNING., Disp: , Rfl:     Allergies:  Patient is allergic to lyrica [pregabalin], trazodone, gabapentin, and tramadol.     Obstetric History:  OB History   Gravida Para Term Preterm AB Living   3 3 3          SAB IAB Ectopic Molar Multiple Live Births                    # Outcome Date GA Lbr Len/2nd Weight Sex Delivery Anes PTL Lv   3 Term            2 Term            1 Term                 Gynecologic History:  She is menopausal.    She does have a history of abnormal Pap smears.   Last pap smear was 25 years ago.    Last colonoscopy was 2020.    She is sexually active.     Family History:  family history includes Cancer (age of onset: 38) in her father; Cancer (age of onset: 70) in her mother; GU problems in her mother; Hyperlipidemia in her sister.     Social History:  Occupation: not currently working  Patient  reports that she quit smoking about 6 years ago. Her smoking use included cigarettes. She started smoking about 23 years ago. She has a 15.0 pack-year smoking history. She has never used smokeless tobacco. She reports that she does not drink alcohol and does not use drugs.     Review of Systems:  A 10 point review of systems was performed.   Positive for as in HPI, otherwise negative review of systems    Objective:   Physical Exam:  BP 134/90  - Pulse 76  - Ht 144.8 cm (4' 9)  - Wt 73 kg (161 lb)  - BMI 34.84 kg/m??   Medical chaperone present for exam: Sharyl Nimrod, RN and Lauren Tholemeier    Constitutional:  General appearance: Well nourished, well developed female in no acute distress.   Psych:  Normal mood and affect  Neuro:  Alert and oriented x 3  Respiratory:  Normal respiratory effort.   Cardiovasular: No edema.   GI:  The abdomen was soft, non-distended. She endorsed severe pain when lightly brushing the skin over the LUQ. No tenderness to deep palpation of the abdomen. No evidence of organomegaly, masses, or hernias. Incisions none  Back:  No evidence of CVAT.  Normal spinal curvature. Significant tenderness to light palpation along paraspinal muscles bilaterally.  Lymphatic: No inguinal lymphadenopathy.  Skin:  No evidence of concerning lesions on gross inspection.    Genitourinary:   Pelvic Exam:   Vulvar exam: clear. No scarring or architectural change of the labia minora, interlabial creases, labia majora, clitoral hood, posterior fourchette, perineum, perianal skin,  Clitoral hood clear   Introitus:No midline agglutination, no narrowing or decrease in introital aperture, urethral meatus fully visible   Bartholin's and Skene's glands normal in appearance    Urethra: urethral meatus normal in appearance, no urethral masses or discharge. - CST/- VST    Speculum exam: vaginal mucosa without lesions and with atrophy  Cervix removed surgically.   Uterus absent   Adnexa  no mass, fullness, tenderness. No masses on bimanual exam.  Prolapse:        11/25/2022     1:45 PM   POP-Q Exams   gh 2   pb 2.5   tvl 9   aa -3   ba -3   ap -3   bp -3   c -8       Anal verge:  Normal in appearance, without evidence of external hemorrhoids; without rectal prolapse    Rectal:   Deferred    Pelvic Floor Muscle Pain Exam:  Right retropubic: 7/10  Right obturator: 9/10  Right levator ani: 7/10  Left levator ani: 9/10  Left obturator: 8/10  Left retropubic: 0/10      Post-Void Residual (PVR) by Bladder Scan:  In order to evaluate bladder emptying, we discussed obtaining a postvoid residual and she agreed to this procedure.   Procedure: The ultrasound unit was placed on the patient???s abdomen in the suprapubic region after the patient had voided. A PVR of 93 ml was obtained by bladder scan.    Laboratory Results:  Urine dipstick shows:   Results for orders placed or performed in visit on 11/25/22   POCT Urinalysis Dipstick   Result Value Ref Range    Spec Gravity/POC 1.020 1.003 - 1.030    PH/POC 7.0 5.0 - 9.0    Leuk Esterase/POC Negative Negative    Nitrite/POC Negative Negative    Protein/POC Negative Negative    UA Glucose/POC Negative Negative    Ketones, POC Negative Negative    Bilirubin/POC Negative Negative    Blood/POC Negative Negative    Urobilinogen/POC 0.2 0.2 - 1.0 mg/dL        I visualized the urine specimen, noting the specimen to be urine color: clear with no sediment, debris or blood    Assessment/Plan: Assessment:  This is a 57 y.o. G37P3000 female who presented with    1. Pelvic pain in female    2. High-tone pelvic floor dysfunction    3. Dyspareunia in female    4. Generalized pain    5. Chronic bilateral low back pain, unspecified whether sciatica present    6. Mixed stress and urge urinary incontinence    7. Incontinence of feces with fecal urgency    8. Generalized abdominal pain    9. Gross hematuria        Plan:   1) Complex pain syndrome, with generalized back and abdominal pain, high-tone pelvic floor dysfunction and dyspareunia, as well as likely centralized component  - with significant tenderness on pelvic floor assessment today. Also generalized pain to abdomin and back  -We discussed that the origin of chronic pelvic pain can be multifactorial, including primary, reactive to a different pain source, trauma, or even part of a centralized pain syndrome.   -For her, the differential includes pelvic floor dysfunction/myofascial pain, back and hip DJD, centralization of pain  -We discussed that treatment is often multifactorial and may involve evaluation and intervention with health professionals from multiple disciplines. The goal is to improve pain to improve overall quality of life, but that we may not be able to achieve complete cure.   -Treatment options include pelvic floor physical therapy, local (vaginal) or oral  muscle relaxants, or centrally acting pain medications.   -She is interested in a referral to pelvic floor PT. Referral placed for pelvic floor physical therapy at [place]  -Perineal icing and/or heat packs to help with musculoskeletal pain may be beneficial in addition to pelvic floor PT. Instructions provided today.   -  Muscle relaxants may be helpful for temporary improvement of pain and for flares. I will prescribe valium 5 mg pills to place vaginally for spasms. We discussed side effects including sedation, dizziness. She will start at night to make sure it does not make her drowsy and use every night for 6 weeks. If the medication makes her drowsy then she will only use at bedtime or she can cut in half. If well tolerated, she can use up to three times a day for severe pain/flares. Once she has improvement and/or resolution of symptoms, she will taper off and can use as needed.   -We recommend a referral to the minimally invasive gynecology chronic pain clinic to see if there are other factors that could be contributing to her pain and supplementary therapies to address her CPP.  -Given generalized back and abdominal pain, we also recommend referral to pain management, for her generalized and back pain    2) Mixed urinary incontinence  - Patient instructed to try behavioral modifications such as limiting bladder irritants such as soda and coffee and trying to replace with water with minimal flavoring  - Avoid drinking over 40-60 oz daily  - Pelvic floor physical therapy to help with pelvic floor muscle strength  - Reassess as pelvic floor dysfunction improved    3) Accidental bowel leakage  - Start with psyllium daily. Start with 1 tsp and increase as tolerated.  -Pelvic floor physical therapy can help with bowel control as well.     4) Gross hematuria  - Patient reporting gross hematuria as recently as one year ago in the setting of several risk factors (smoking history, mother with bladder cancer). Prior workup was last in 2020.  - She will return for cystoscopy. Risks dicussed including bleeding, infection, damage to surrounding structures. She has previously been seen by urology at [facility], which is closer to her, and she will see if she can get a sooner appointment there.   - Last CT A/P 01/07/22 negative for GU pathology, no need to repeat at this time.    Patient will return for cystoscopy then follow up for symptoms in six months.      Time Spent:    For CONSULTATIONS:    I saw and evaluated the patient, participating in the key portions of the service.  I reviewed the fellow???s note.  I agree with the fellow???s findings and plan as documented in this note.     I personally spent 50 minutes face-to-face with the patient and greater than 50% of that time was spent in counseling or coordinating care with the patient regarding the above issues.     Questions were answered to the patient's satisfaction.    Sherian Maroon, MD  ATTENDING      Medical Decision Making - Amount and Complexity of Data  1 point: I reviewed and/or ordered a clinical laboratory test (6xxxx series).   1 point: I reviewed and/or ordered a radiologic study (8xxxx series).  2 points: I have reviewed/summarized the patient's outside medical records and/or I have obtained history during today's visit from someone other than the patient and/or I have discussed the case with another healthcare provider  2 points: I independently visualized the image, tracing or specimen itself indicating no sign of infection or renal disease.

## 2022-11-26 ENCOUNTER — Ambulatory Visit
Admission: RE | Admit: 2022-11-26 | Discharge: 2022-11-26 | Disposition: A | Discharge status: Home / Self Care | Payer: 59 | Source: Ambulatory Visit | Attending: Family Medicine | Admitting: Family Medicine

## 2022-11-26 DIAGNOSIS — Z1231 Encounter for screening mammogram for malignant neoplasm of breast: Secondary | ICD-10-CM | POA: Insufficient documentation

## 2022-11-27 ENCOUNTER — Encounter: Payer: 59 | Admitting: *Deleted

## 2022-11-29 ENCOUNTER — Encounter: Payer: 59 | Attending: Student in an Organized Health Care Education/Training Program | Admitting: *Deleted

## 2022-11-29 DIAGNOSIS — R0602 Shortness of breath: Secondary | ICD-10-CM | POA: Insufficient documentation

## 2022-11-29 DIAGNOSIS — J432 Centrilobular emphysema: Secondary | ICD-10-CM | POA: Diagnosis not present

## 2022-11-29 NOTE — Progress Notes (Signed)
Daily Session Note  Patient Details  Name: Lauren Escobar MRN: [id number] Date of Birth: [birth year] Referring Provider:   Flowsheet Row Pulmonary Rehab from 09/30/2022 in [facility] Cardiac and Pulmonary Rehab  Referring Provider Armando Reichert MD       Encounter Date: 11/29/2022  Check In:  Session Check In - 11/29/22 1008       Check-In   Supervising physician immediately available to respond to emergencies See telemetry face sheet for immediately available ER MD    Location [facility]-Cardiac & Pulmonary Rehab    Staff Present Heath Lark, RN, BSN, CCRP;Jessica Mulberry, MA, RCEP, CCRP, CCET;Joseph [place], [place]    Virtual Visit No    Medication changes reported     No    Fall or balance concerns reported    No    Warm-up and Cool-down Performed on first and last piece of equipment    Resistance Training Performed Yes    VAD Patient? No    PAD/SET Patient? No      Pain Assessment   Currently in Pain? No/denies                Social History   Tobacco Use  Smoking Status Former   Packs/day: 2.75   Years: 29.00   Total pack years: 79.75   Types: Cigarettes   Quit date: 10/2020   Years since quitting: 2.0  Smokeless Tobacco Never    Goals Met:  Proper associated with RPD/PD & O2 Sat Independence with exercise equipment Exercise tolerated well No report of concerns or symptoms today  Goals Unmet:  Not Applicable  Comments: Pt able to follow exercise prescription today without complaint.  Will continue to monitor for progression.     Dr. Emily Filbert is Medical Director for [place].  Dr. Ottie Glazier is Medical Director for [facility] Pulmonary Rehabilitation.

## 2022-12-02 ENCOUNTER — Encounter: Payer: 59 | Admitting: *Deleted

## 2022-12-02 DIAGNOSIS — J432 Centrilobular emphysema: Secondary | ICD-10-CM | POA: Diagnosis not present

## 2022-12-02 DIAGNOSIS — R0602 Shortness of breath: Secondary | ICD-10-CM

## 2022-12-02 NOTE — Progress Notes (Signed)
Daily Session Note  Patient Details  Name: Lauren Escobar MRN: [id number] Date of Birth: [birth year] Referring Provider:   Flowsheet Row Pulmonary Rehab from 09/30/2022 in [facility] Cardiac and Pulmonary Rehab  Referring Provider Armando Reichert MD       Encounter Date: 12/02/2022  Check In:  Session Check In - 12/02/22 0923       Check-In   Supervising physician immediately available to respond to emergencies See telemetry face sheet for immediately available ER MD    Location [facility]-Cardiac & Pulmonary Rehab    Staff Present Darlyne Russian, RN, Doyce Para, BS, ACSM CEP, Exercise Physiologist;Joseph Tessie [place], [place]    Virtual Visit No    Medication changes reported     No    Fall or balance concerns reported    No    Warm-up and Cool-down Performed on first and last piece of equipment    Resistance Training Performed Yes    VAD Patient? No    PAD/SET Patient? No      Pain Assessment   Currently in Pain? No/denies                Social History   Tobacco Use  Smoking Status Former   Packs/day: 2.75   Years: 29.00   Total pack years: 79.75   Types: Cigarettes   Quit date: 10/2020   Years since quitting: 2.0  Smokeless Tobacco Never    Goals Met:  Independence with exercise equipment Exercise tolerated well No report of concerns or symptoms today Strength training completed today  Goals Unmet:  Not Applicable  Comments: Pt able to follow exercise prescription today without complaint.  Will continue to monitor for progression.    Dr. Emily Filbert is Medical Director for [place].  Dr. Ottie Glazier is Medical Director for [facility] Pulmonary Rehabilitation.

## 2022-12-04 ENCOUNTER — Encounter: Payer: 59 | Admitting: *Deleted

## 2022-12-04 DIAGNOSIS — R0602 Shortness of breath: Secondary | ICD-10-CM

## 2022-12-04 DIAGNOSIS — J432 Centrilobular emphysema: Secondary | ICD-10-CM | POA: Diagnosis not present

## 2022-12-04 NOTE — Progress Notes (Signed)
Daily Session Note  Patient Details  Name: Lauren Escobar MRN: [id number] Date of Birth: May 02, 1966 Referring Provider:   Flowsheet Row Pulmonary Rehab from 09/30/2022 in [facility] Cardiac and Pulmonary Rehab  Referring Provider Armando Reichert MD       Encounter Date: 12/04/2022  Check In:  Session Check In - 12/04/22 8864       Check-In   Supervising physician immediately available to respond to emergencies See telemetry face sheet for immediately available ER MD    Location [facility]-Cardiac & Pulmonary Rehab    Staff Present Darlyne Russian, RN, Lorin Mercy, MS, ACSM CEP, Exercise Physiologist;Noah Tickle, BS, Exercise Physiologist    Virtual Visit No    Medication changes reported     No    Fall or balance concerns reported    No    Warm-up and Cool-down Performed on first and last piece of equipment    Resistance Training Performed Yes    VAD Patient? No    PAD/SET Patient? No      Pain Assessment   Currently in Pain? No/denies                Social History   Tobacco Use  Smoking Status Former   Packs/day: 2.75   Years: 29.00   Total pack years: 79.75   Types: Cigarettes   Quit date: 10/2020   Years since quitting: 2.1  Smokeless Tobacco Never    Goals Met:  Independence with exercise equipment Exercise tolerated well No report of concerns or symptoms today Strength training completed today  Goals Unmet:  Not Applicable  Comments: Pt able to follow exercise prescription today without complaint.  Will continue to monitor for progression.    Dr. Emily Filbert is Medical Director for [place].  Dr. Ottie Glazier is Medical Director for [facility] Pulmonary Rehabilitation.

## 2022-12-06 ENCOUNTER — Encounter: Payer: 59 | Admitting: *Deleted

## 2022-12-06 DIAGNOSIS — R0602 Shortness of breath: Secondary | ICD-10-CM

## 2022-12-06 DIAGNOSIS — J432 Centrilobular emphysema: Secondary | ICD-10-CM

## 2022-12-06 NOTE — Progress Notes (Signed)
Daily Session Note  Patient Details  Name: Lauren Escobar MRN: [id number] Date of Birth: [birth year] Referring Provider:   Flowsheet Row Pulmonary Rehab from 09/30/2022 in [facility]  Referring Provider Armando Reichert MD       Encounter Date: 12/06/2022  Check In:  Session Check In - 12/06/22 1030       Check-In   Supervising physician immediately available to respond to emergencies See telemetry face sheet for immediately available ER MD    Location [facility]-Cardiac & Pulmonary Rehab    Staff Present Heath Lark, RN, BSN, CCRP;Jessica Glide, MA, RCEP, CCRP, CCET;Joseph McIntosh, [place]    Virtual Visit No    Medication changes reported     No    Fall or balance concerns reported    No    Warm-up and Cool-down Performed on first and last piece of equipment    Resistance Training Performed Yes    VAD Patient? No    PAD/SET Patient? No      Pain Assessment   Currently in Pain? No/denies                Social History   Tobacco Use  Smoking Status Former   Packs/day: 2.75   Years: 29.00   Total pack years: 79.75   Types: Cigarettes   Quit date: 10/2020   Years since quitting: 2.1  Smokeless Tobacco Never    Goals Met:  Proper associated with RPD/PD & O2 Sat Independence with exercise equipment Exercise tolerated well No report of concerns or symptoms today  Goals Unmet:  Not Applicable  Comments: Pt able to follow exercise prescription today without complaint.  Will continue to monitor for progression.    Dr. Emily Filbert is Medical Director for [place].  Dr. Ottie Glazier is Medical Director for [facility] Pulmonary Rehabilitation.

## 2022-12-09 ENCOUNTER — Encounter: Payer: 59 | Admitting: *Deleted

## 2022-12-10 NOTE — Unmapped (Signed)
[facility] Refill Coordination Note    Specialty Medication(s) to be Shipped:   Hematology/Oncology: [name]    Other medication(s) to be shipped: No additional medications requested for fill at this time     Courtney Erickson, DOB: 20-Jun-1966  Phone: [phone] (home)       All above HIPAA information was verified with patient.     Was a Nurse, learning disability used for this call? No    Completed refill call assessment today to schedule patient's medication shipment from the [facility] [phone]).  All relevant notes have been reviewed.     Specialty medication(s) and dose(s) confirmed: Regimen is correct and unchanged.   Changes to medications: Aeliana reports no changes at this time.  Changes to insurance: No  New side effects reported not previously addressed with a pharmacist or physician: None reported  Questions for the pharmacist: No    Confirmed patient received a Conservation officer, historic buildings and a Surveyor, mining with first shipment. The patient will receive a drug information handout for each medication shipped and additional FDA Medication Guides as required.       DISEASE/MEDICATION-SPECIFIC INFORMATION        N/A    SPECIALTY MEDICATION ADHERENCE     Medication Adherence    Patient reported X missed doses in the last month: 0  Specialty Medication: [name]  Patient is on additional specialty medications: No  Informant: patient     Were doses missed due to medication being on hold? No    Jakafi 15mg  : 14 days of medicine on hand       REFERRAL TO PHARMACIST     Referral to the pharmacist: Not needed      [facility]     Shipping address confirmed in Epic.     Delivery Scheduled: Yes, Expected medication delivery date: 12/20/22.     Medication will be delivered via Next Day Courier to the prescription address in Epic [place].    [name]   [facility] Specialty Technician

## 2022-12-11 ENCOUNTER — Encounter: Payer: 59 | Admitting: *Deleted

## 2022-12-13 ENCOUNTER — Encounter: Payer: 59 | Admitting: *Deleted

## 2022-12-16 ENCOUNTER — Encounter: Payer: 59 | Admitting: *Deleted

## 2022-12-18 ENCOUNTER — Telehealth: Payer: Self-pay

## 2022-12-18 ENCOUNTER — Encounter: Payer: Self-pay | Admitting: *Deleted

## 2022-12-18 ENCOUNTER — Encounter: Payer: 59 | Admitting: *Deleted

## 2022-12-18 DIAGNOSIS — R0602 Shortness of breath: Secondary | ICD-10-CM

## 2022-12-18 DIAGNOSIS — J432 Centrilobular emphysema: Secondary | ICD-10-CM

## 2022-12-18 NOTE — Telephone Encounter (Signed)
Called patient to check in on patient who has been feeling sick. Has not attended since 2/9. She was told she had a flare up with bronchitis. She states she spoke with her doctor who told her to stay out of exercise until 2/28. She was given new inhalers. Patient will call us if she still does not feel good to come back next week.

## 2022-12-18 NOTE — Progress Notes (Signed)
Pulmonary Individual Treatment Plan  Patient Details  Name: Lauren Escobar MRN: [id number] Date of Birth: 03-22-1966 Referring Provider:   Flowsheet Row Pulmonary Rehab from 09/30/2022 in [facility] Cardiac and Pulmonary Rehab  Referring Provider Armando Reichert MD       Initial Encounter Date:  Flowsheet Row Pulmonary Rehab from 09/30/2022 in [facility] Cardiac and Pulmonary Rehab  Date 09/30/22       Visit Diagnosis: Centrilobular emphysema ([place])  Shortness of breath  Patient's Home Medications on Admission:  Current Outpatient Medications:    albuterol (VENTOLIN HFA) 108 (90 Base) MCG/ACT inhaler, Inhale into the lungs., Disp: , Rfl:    aspirin EC 81 MG tablet, Take by mouth., Disp: , Rfl:    atorvastatin (LIPITOR) 20 MG tablet, TAKE 1 TABLET BY MOUTH AT BEDTIME FOR HIGH CHOLESTEROL, Disp: , Rfl:    cetirizine (ZYRTEC) 10 MG tablet, TAKE 1 TABLET BY MOUTH DAILY FOR ALLERGIES, Disp: , Rfl:    dextromethorphan-guaiFENesin (MUCINEX DM) 30-600 MG 12hr tablet, Take 1 tablet by mouth 2 (two) times daily as needed for cough., Disp: 14 tablet, Rfl: 0   esomeprazole (NEXIUM) 40 MG capsule, Take 40 mg by mouth daily. , Disp: , Rfl:    ferrous sulfate 325 (65 FE) MG tablet, Take by mouth., Disp: , Rfl:    fluticasone furoate-vilanterol (BREO ELLIPTA) 100-25 MCG/ACT AEPB, Inhale 1 puff into the lungs daily., Disp: 30 each, Rfl: 11   folic acid (FOLVITE) 1 MG tablet, Take 1 mg by mouth daily., Disp: , Rfl:    isosorbide mononitrate (IMDUR) 30 MG 24 hr tablet, Take 30 mg by mouth daily., Disp: , Rfl:    Multiple Vitamins-Minerals (MULTIVIT/MULTIMINERAL ADULT PO), Take by mouth., Disp: , Rfl:    nicotine (NICODERM CQ - DOSED IN MG/24 HOURS) 21 mg/24hr patch, Place 1 patch (21 mg total) onto the skin daily., Disp: 28 patch, Rfl: 0   NITROGLYCERIN SL, Place 1 tablet under the tongue. (Patient not taking: Reported on 09/11/2022), Disp: , Rfl:    oseltamivir (TAMIFLU) 75 MG capsule, Take 1 capsule (75 mg  total) by mouth 2 (two) times daily., Disp: 5 capsule, Rfl: 0   promethazine (PHENERGAN) 12.5 MG tablet, Take 1 tablet (12.5 mg total) by mouth every 6 (six) hours as needed for nausea or vomiting., Disp: 20 tablet, Rfl: 0   propranolol (INDERAL) 40 MG tablet, TAKE 1 TABLET TWICE DAILY TO PREVENT HEADACHES, Disp: , Rfl:    REXULTI 0.5 MG TABS, TAKE 1 TABLET BY MOUTH AT BEDTIME FOR 1 WEEK THEN INCREASE TO 1 MG TABLET, Disp: 67 tablet, Rfl: 1   risperiDONE (RISPERDAL) 1 MG tablet, Take 0.5 tablets (0.5 mg total) by mouth at bedtime for 7 days, THEN 1 tablet (1 mg total) at bedtime., Disp: 33.5 tablet, Rfl: 0   venlafaxine XR (EFFEXOR-XR) 150 MG 24 hr capsule, Take 2 capsules (300 mg total) by mouth every morning., Disp: 30 capsule, Rfl: 3   VRAYLAR 1.5 MG capsule, Take 1 capsule (1.5 mg total) by mouth daily., Disp: 30 capsule, Rfl: 3  Past Medical History: Past Medical History:  Diagnosis Date   Abdominal pain    Abnormal mammogram    Anxiety    Asthma    Bleeding disorder ([place])    Blood dyscrasia    polycythemia vera   Chronic kidney disease    COPD (chronic obstructive pulmonary disease) (HCC)    Cough    Depression    Diabetes mellitus without complication ([place])  Fatigue    Headache 2000   Horizontal nystagmus    age 57   Hyperglycemia    Hypertension    Low back pain    Major depressive disorder    Nasal lesion    Neck pain    Snoring    SOB (shortness of breath)    Tobacco use    UTI (urinary tract infection)    Von Willebrand's disease ([place])     Tobacco Use: Social History   Tobacco Use  Smoking Status Former   Packs/day: 2.75   Years: 29.00   Total pack years: 79.75   Types: Cigarettes   Quit date: 10/2020   Years since quitting: 2.1  Smokeless Tobacco Never    Labs: Review Flowsheet       Latest Ref Rng & Units 04/06/2013 01/01/2014 06/16/2017 10/15/2022  Labs for ITP Cardiac and Pulmonary Rehab  Cholestrol 0 - 200 mg/dL 119  109  - 175   LDL (calc) 0  - 99 mg/dL 34  40  - 92   HDL-C >40 mg/dL 32  34  - 49   Trlycerides <150 mg/dL 267  175  - 172   Hemoglobin A1c 4.8 - 5.6 % 5.2  5.5  5.5  6.1   Bicarbonate 20.0 - 28.0 mmol/L - - - 23.4   Acid-base deficit 0.0 - 2.0 mmol/L - - - 0.7   O2 Saturation % - - - 84.1      Pulmonary Assessment Scores:  Pulmonary Assessment Scores     Row Name 09/30/22 1109         ADL UCSD   ADL Phase Entry     SOB Score total 62     Rest 1     Walk 2     Stairs 5     Bath 4     Dress 2     Shop 3       CAT Score   CAT Score 20       mMRC Score   mMRC Score 3              UCSD: Self-administered rating of dyspnea associated with activities of daily living (ADLs) 6-point scale (0 = "not at all" to 5 = "maximal or unable to do because of breathlessness")  Scoring Scores range from 0 to 120.  Minimally important difference is 5 units  CAT: CAT can identify the health impairment of COPD patients and is better correlated with disease progression.  CAT has a scoring range of zero to 40. The CAT score is classified into four groups of low (less than 10), medium (10 - 20), high (21-30) and very high (31-40) based on the impact level of disease on health status. A CAT score over 10 suggests significant symptoms.  A worsening CAT score could be explained by an exacerbation, poor medication adherence, poor inhaler technique, or progression of COPD or comorbid conditions.  CAT MCID is 2 points  mMRC: mMRC (Modified Medical Research Council) Dyspnea Scale is used to assess the degree of baseline functional disability in patients of respiratory disease due to dyspnea. No minimal important difference is established. A decrease in score of 1 point or greater is considered a positive change.   Pulmonary Function Assessment:  Pulmonary Function Assessment - 09/30/22 1109       Breath   Shortness of Breath Yes;Fear of Shortness of Breath;Limiting activity;Panic with Shortness of Breath  Exercise Target Goals: Exercise Program Goal: Individual exercise prescription set using results from initial 6 min walk test and THRR while considering  patient's activity barriers and safety.   Exercise Prescription Goal: Initial exercise prescription builds to 30-45 minutes a day of aerobic activity, 2-3 days per week.  Home exercise guidelines will be given to patient during program as part of exercise prescription that the participant will acknowledge.  Education: Aerobic Exercise: - Group verbal and visual presentation on the components of exercise prescription. Introduces F.I.T.T principle from ACSM for exercise prescriptions.  Reviews F.I.T.T. principles of aerobic exercise including progression. Written material given at graduation.   Education: Resistance Exercise: - Group verbal and visual presentation on the components of exercise prescription. Introduces F.I.T.T principle from ACSM for exercise prescriptions  Reviews F.I.T.T. principles of resistance exercise including progression. Written material given at graduation. Flowsheet Row Pulmonary Rehab from 12/04/2022 in [facility] Cardiac and Pulmonary Rehab  Date 11/20/22  Educator [facility]  Instruction Review Code 1- Verbalizes Understanding        Education: Exercise & Equipment Safety: - Individual verbal instruction and demonstration of equipment use and safety with use of the equipment. Flowsheet Row Pulmonary Rehab from 12/04/2022 in [facility] Cardiac and Pulmonary Rehab  Date 09/30/22  Educator [facility]  Instruction Review Code 1- Verbalizes Understanding       Education: Exercise Physiology & General Exercise Guidelines: - Group verbal and written instruction with models to review the exercise physiology of the cardiovascular system and associated critical values. Provides general exercise guidelines with specific guidelines to those with heart or lung disease.    Education: Flexibility, Balance, Mind/Body Relaxation: - Group verbal  and visual presentation with interactive activity on the components of exercise prescription. Introduces F.I.T.T principle from ACSM for exercise prescriptions. Reviews F.I.T.T. principles of flexibility and balance exercise training including progression. Also discusses the mind body connection.  Reviews various relaxation techniques to help reduce and manage stress (i.e. Deep breathing, progressive muscle relaxation, and visualization). Balance handout provided to take home. Written material given at graduation. Flowsheet Row Pulmonary Rehab from 12/04/2022 in [facility] Cardiac and Pulmonary Rehab  Date 11/20/22  Educator [facility]  Instruction Review Code 1- Verbalizes Understanding       Activity Barriers & Risk Stratification:  Activity Barriers & Cardiac Risk Stratification - 09/30/22 1100       Activity Barriers & Cardiac Risk Stratification   Activity Barriers Left Knee Replacement;Shortness of Breath;Balance Concerns;History of Falls;Muscular Weakness;Deconditioning;Back Problems   occasional back pain            6 Minute Walk:  6 Minute Walk     Row Name 09/30/22 1058         6 Minute Walk   Phase Initial     Distance 1315 feet     Walk Time 6 minutes     # of Rest Breaks 0     MPH 2.49     METS 3.55     RPE 15     Perceived Dyspnea  3     VO2 Peak 12.42     Symptoms Yes (comment)     Comments SOB, Fatigue     Resting HR 83 bpm     Resting BP 124/66     Resting Oxygen Saturation  96 %     Exercise Oxygen Saturation  during 6 min walk 95 %     Max Ex. HR 106 bpm     Max Ex. BP 146/74  2 Minute Post BP 136/72       Interval HR   1 Minute HR 104     2 Minute HR 104     3 Minute HR 103     4 Minute HR 106     5 Minute HR 101     6 Minute HR 101     2 Minute Post HR 82     Interval Heart Rate? Yes       Interval Oxygen   Interval Oxygen? Yes     Baseline Oxygen Saturation % 96 %     1 Minute Oxygen Saturation % 95 %     1 Minute Liters of Oxygen 0 L  Room Air      2 Minute Oxygen Saturation % 96 %     2 Minute Liters of Oxygen 0 L     3 Minute Oxygen Saturation % 96 %     3 Minute Liters of Oxygen 0 L     4 Minute Oxygen Saturation % 96 %     4 Minute Liters of Oxygen 0 L     5 Minute Oxygen Saturation % 95 %     5 Minute Liters of Oxygen 0 L     6 Minute Oxygen Saturation % 96 %     6 Minute Liters of Oxygen 0 L     2 Minute Post Oxygen Saturation % 96 %     2 Minute Post Liters of Oxygen 0 L             Oxygen Initial Assessment:  Oxygen Initial Assessment - 09/30/22 1109       Home Oxygen   Home Oxygen Device None    Sleep Oxygen Prescription None    Home Exercise Oxygen Prescription None    Home Resting Oxygen Prescription None      Initial 6 min Walk   Oxygen Used None      Program Oxygen Prescription   Program Oxygen Prescription None      Intervention   Short Term Goals To learn and demonstrate proper pursed lip breathing techniques or other breathing techniques. ;To learn and understand importance of maintaining oxygen saturations>88%;To learn and demonstrate proper use of respiratory medications;To learn and understand importance of monitoring SPO2 with pulse oximeter and demonstrate accurate use of the pulse oximeter.    Long  Term Goals Maintenance of O2 saturations>88%;Exhibits proper breathing techniques, such as pursed lip breathing or other method taught during program session;Compliance with respiratory medication;Demonstrates proper use of MDI's;Verbalizes importance of monitoring SPO2 with pulse oximeter and return demonstration             Oxygen Re-Evaluation:  Oxygen Re-Evaluation     Row Name 10/07/22 0956 11/01/22 0943 12/04/22 0947         Program Oxygen Prescription   Program Oxygen Prescription -- None None       Home Oxygen   Home Oxygen Device -- None None     Sleep Oxygen Prescription -- None None     Home Exercise Oxygen Prescription -- None None     Home Resting Oxygen Prescription --  None None       Goals/Expected Outcomes   Short Term Goals -- To learn and understand importance of maintaining oxygen saturations>88%;To learn and understand importance of monitoring SPO2 with pulse oximeter and demonstrate accurate use of the pulse oximeter. To learn and understand importance of maintaining oxygen saturations>88%;To learn and understand importance of  monitoring SPO2 with pulse oximeter and demonstrate accurate use of the pulse oximeter.;To learn and demonstrate proper pursed lip breathing techniques or other breathing techniques. ;To learn and demonstrate proper use of respiratory medications     Long  Term Goals -- Maintenance of O2 saturations>88%;Verbalizes importance of monitoring SPO2 with pulse oximeter and return demonstration Verbalizes importance of monitoring SPO2 with pulse oximeter and return demonstration;Maintenance of O2 saturations>88%;Exhibits proper breathing techniques, such as pursed lip breathing or other method taught during program session;Compliance with respiratory medication;Demonstrates proper use of MDI's     Comments Reviewed PLB technique with pt.  Talked about how it works and it's importance in maintaining their exercise saturations. She does not have a pulse oximeter to check her oxygen saturation at home. Informed her where to get one and explained why it is important to have one. Reviewed that oxygen saturations should be 88 percent and above. Patient verbalizes understanding. Lauren Escobar is doing well in rehab.  She has good days and bad days with her breathing.  It is still one of her biggest limitations along with her lack of sleep.  She  does not currently have a pulse oximeter at home, but will get one from her [place].  She has been good about using her inhaler but would like to talk with her pulmonologist about getting a nebulizer for home again.     Goals/Expected Outcomes Short: Become more profiecient at using PLB. Long: Become independent  at using PLB. Short: monitor oxygen at home with exertion. Long: maintain oxygen saturations above 88 percent independently. Short; Talk to doctor about nebulizer Long: continue to manage lung disease              Oxygen Discharge (Final Oxygen Re-Evaluation):  Oxygen Re-Evaluation - 12/04/22 0947       Program Oxygen Prescription   Program Oxygen Prescription None      Home Oxygen   Home Oxygen Device None    Sleep Oxygen Prescription None    Home Exercise Oxygen Prescription None    Home Resting Oxygen Prescription None      Goals/Expected Outcomes   Short Term Goals To learn and understand importance of maintaining oxygen saturations>88%;To learn and understand importance of monitoring SPO2 with pulse oximeter and demonstrate accurate use of the pulse oximeter.;To learn and demonstrate proper pursed lip breathing techniques or other breathing techniques. ;To learn and demonstrate proper use of respiratory medications    Long  Term Goals Verbalizes importance of monitoring SPO2 with pulse oximeter and return demonstration;Maintenance of O2 saturations>88%;Exhibits proper breathing techniques, such as pursed lip breathing or other method taught during program session;Compliance with respiratory medication;Demonstrates proper use of MDI's    Comments Lauren Escobar is doing well in rehab.  She has good days and bad days with her breathing.  It is still one of her biggest limitations along with her lack of sleep.  She  does not currently have a pulse oximeter at home, but will get one from her [place].  She has been good about using her inhaler but would like to talk with her pulmonologist about getting a nebulizer for home again.    Goals/Expected Outcomes Short; Talk to doctor about nebulizer Long: continue to manage lung disease             Initial Exercise Prescription:  Initial Exercise Prescription - 09/30/22 1100       Date of Initial Exercise RX and Referring Provider    Date 09/30/22  Referring Provider Armando Reichert MD      NuStep   Level 3    SPM 80    Minutes 15    METs 2      Arm Ergometer   Level 1    RPM 25    Minutes 15    METs 2      Biostep-RELP   Level 2    SPM 50    Minutes 15    METs 2      Track   Laps 34    Minutes 15    METs 2.85      Prescription Details   Frequency (times per week) 3    Duration Progress to 30 minutes of continuous aerobic without signs/symptoms of physical distress      Intensity   THRR 40-80% of Max Heartrate 115-147    Ratings of Perceived Exertion 11-13    Perceived Dyspnea 0-4      Progression   Progression Continue to progress workloads to maintain intensity without signs/symptoms of physical distress.      Resistance Training   Training Prescription Yes    Weight 3 lb    Reps 10-15             Perform Capillary Blood Glucose checks as needed.  Exercise Prescription Changes:   Exercise Prescription Changes     Row Name 09/30/22 1100 10/22/22 1400 11/04/22 1100 11/18/22 1500 12/02/22 1200     Response to Exercise   Blood Pressure (Admit) 124/66 116/70 128/64 110/60 108/60   Blood Pressure (Exercise) 146/74 134/72 134/70 128/66 --   Blood Pressure (Exit) 126/62 114/66 126/64 102/62 128/72   Heart Rate (Admit) 83 bpm 84 bpm 87 bpm 87 bpm 82 bpm   Heart Rate (Exercise) 106 bpm 102 bpm 100 bpm 98 bpm 127 bpm   Heart Rate (Exit) 77 bpm 93 bpm 82 bpm 85 bpm 94 bpm   Oxygen Saturation (Admit) 96 % 99 % 98 % 97 % 98 %   Oxygen Saturation (Exercise) 95 % 95 % 93 % 97 % 95 %   Oxygen Saturation (Exit) 98 % 98 % 98 % 99 % 98 %   Rating of Perceived Exertion (Exercise) 15 15 15 14 13   $ Perceived Dyspnea (Exercise) 3 3 3 2 1   $ Symptoms SOB, fatigue SOB SOB SOB none   Comments walk test results First 2 weeks of exercise -- -- --   Duration -- Continue with 30 min of aerobic exercise without signs/symptoms of physical distress. Continue with 30 min of aerobic exercise without  signs/symptoms of physical distress. Continue with 30 min of aerobic exercise without signs/symptoms of physical distress. Continue with 30 min of aerobic exercise without signs/symptoms of physical distress.   Intensity -- THRR unchanged THRR unchanged THRR unchanged THRR unchanged     Progression   Progression -- Continue to progress workloads to maintain intensity without signs/symptoms of physical distress. Continue to progress workloads to maintain intensity without signs/symptoms of physical distress. Continue to progress workloads to maintain intensity without signs/symptoms of physical distress. Continue to progress workloads to maintain intensity without signs/symptoms of physical distress.   Average METs -- 2 1.9 2.33 2.31     Resistance Training   Training Prescription -- Yes Yes Yes Yes   Weight -- 3 lb 3 lb 3 lb 3 lb   Reps -- 10-15 10-15 10-15 10-15     Interval Training   Interval Training -- No No No No  NuStep   Level -- 3 -- 4 --   Minutes -- 15 -- 15 --   METs -- 2 -- -- --     Arm Ergometer   Level -- 1 -- 2.1 1.7   Minutes -- 15 -- 15 15   METs -- 2 -- 2 2     T5 Nustep   Level -- -- 3 3 3   $ Minutes -- -- 30 30 30   $ METs -- -- 1.7 -- 2     Biostep-RELP   Level -- 3 1 -- 4   Minutes -- 15 15 -- 30   METs -- 2 2 -- 3     Track   Laps -- 32 -- 28 --   Minutes -- 15 -- 15 --   METs -- 2.74 -- 2.52 --     Oxygen   Maintain Oxygen Saturation -- 88% or higher 88% or higher 88% or higher 88% or higher    Row Name 12/04/22 0900 12/16/22 1400           Response to Exercise   Blood Pressure (Admit) -- 104/64      Blood Pressure (Exit) -- 108/60      Heart Rate (Admit) -- 81 bpm      Heart Rate (Exercise) -- 96 bpm      Heart Rate (Exit) -- 82 bpm      Oxygen Saturation (Admit) -- 98 %      Oxygen Saturation (Exercise) -- 90 %      Oxygen Saturation (Exit) -- 96 %      Rating of Perceived Exertion (Exercise) -- 13      Perceived Dyspnea (Exercise)  -- 3      Symptoms -- SOB      Duration -- Continue with 30 min of aerobic exercise without signs/symptoms of physical distress.      Intensity -- THRR unchanged        Progression   Progression -- Continue to progress workloads to maintain intensity without signs/symptoms of physical distress.      Average METs -- 2.12        Resistance Training   Training Prescription -- Yes      Weight -- 3 lb      Reps -- 10-15        Interval Training   Interval Training -- No        NuStep   Level -- 3      Minutes -- 30      METs -- 2.4        Arm Ergometer   Level -- 1      Minutes -- 15      METs -- 1.9        Track   Laps -- 22      Minutes -- 15      METs -- 2.2        Home Exercise Plan   Plans to continue exercise at Home (comment)  walking, staff videos Home (comment)  walking, staff videos      Frequency Add 2 additional days to program exercise sessions. Add 2 additional days to program exercise sessions.      Initial Home Exercises Provided 12/04/22 12/04/22        Oxygen   Maintain Oxygen Saturation -- 88% or higher               Exercise Comments:   Exercise Comments  [place] Name 10/07/22 0955           Exercise Comments First full day of exercise!  Patient was oriented to gym and equipment including functions, settings, policies, and procedures.  Patient's individual exercise prescription and treatment plan were reviewed.  All starting workloads were established based on the results of the 6 minute walk test done at initial orientation visit.  The plan for exercise progression was also introduced and progression will be customized based on patient's performance and goals.                Exercise Goals and Review:   Exercise Goals     Row Name 09/30/22 1102             Exercise Goals   Increase Physical Activity Yes       Intervention Provide advice, education, support and counseling about physical activity/exercise needs.;Develop an  individualized exercise prescription for aerobic and resistive training based on initial evaluation findings, risk stratification, comorbidities and participant's personal goals.       Expected Outcomes Long Term: Exercising regularly at least 3-5 days a week.;Long Term: Add in home exercise to make exercise part of routine and to increase amount of physical activity.;Short Term: Attend rehab on a regular basis to increase amount of physical activity.       Increase Strength and Stamina Yes       Intervention Provide advice, education, support and counseling about physical activity/exercise needs.;Develop an individualized exercise prescription for aerobic and resistive training based on initial evaluation findings, risk stratification, comorbidities and participant's personal goals.       Expected Outcomes Short Term: Increase workloads from initial exercise prescription for resistance, speed, and METs.;Short Term: Perform resistance training exercises routinely during rehab and add in resistance training at home;Long Term: Improve cardiorespiratory fitness, muscular endurance and strength as measured by increased METs and functional capacity (6MWT)       Able to understand and use rate of perceived exertion (RPE) scale Yes       Intervention Provide education and explanation on how to use RPE scale       Expected Outcomes Short Term: Able to use RPE daily in rehab to express subjective intensity level;Long Term:  Able to use RPE to guide intensity level when exercising independently       Able to understand and use Dyspnea scale Yes       Intervention Provide education and explanation on how to use Dyspnea scale       Expected Outcomes Short Term: Able to use Dyspnea scale daily in rehab to express subjective sense of shortness of breath during exertion;Long Term: Able to use Dyspnea scale to guide intensity level when exercising independently       Knowledge and understanding of Target Heart Rate Range  (THRR) Yes       Intervention Provide education and explanation of THRR including how the numbers were predicted and where they are located for reference       Expected Outcomes Short Term: Able to state/look up THRR;Short Term: Able to use daily as guideline for intensity in rehab;Long Term: Able to use THRR to govern intensity when exercising independently       Able to check pulse independently Yes       Intervention Provide education and demonstration on how to check pulse in carotid and radial arteries.;Review the importance of being able to check your own pulse for safety during independent exercise  Expected Outcomes Short Term: Able to explain why pulse checking is important during independent exercise;Long Term: Able to check pulse independently and accurately       Understanding of Exercise Prescription Yes       Intervention Provide education, explanation, and written materials on patient's individual exercise prescription       Expected Outcomes Short Term: Able to explain program exercise prescription;Long Term: Able to explain home exercise prescription to exercise independently                Exercise Goals Re-Evaluation :  Exercise Goals Re-Evaluation     Row Name 10/07/22 0955 10/22/22 1426 11/04/22 1152 11/18/22 1525 12/02/22 1203     Exercise Goal Re-Evaluation   Exercise Goals Review Able to understand and use rate of perceived exertion (RPE) scale;Able to understand and use Dyspnea scale;Knowledge and understanding of Target Heart Rate Range (THRR);Understanding of Exercise Prescription Increase Physical Activity;Increase Strength and Stamina;Understanding of Exercise Prescription Increase Physical Activity;Increase Strength and Stamina;Understanding of Exercise Prescription Increase Physical Activity;Increase Strength and Stamina;Understanding of Exercise Prescription Increase Physical Activity;Increase Strength and Stamina;Understanding of Exercise Prescription    Comments Reviewed RPE scale, THR and program prescription with pt today.  Pt voiced understanding and was given a copy of goals to take home. Lauren Escobar is off to a good start in rehab. She was able to work at an average MET level of 2 METs during her first two weeks in rehab. She also was able to walk up to 32 laps on the track. She was able to follow her initial exercise prescription on the seated machines as well. We will continue to monitor her progress in the program. Lauren Escobar continues to do well in rehab, however, she missed a few sessions as she was in the hospital for the flu. She started to ease back into her exercise and tried out the T5 at level 3. She also went down on her Biostep level but will remind patient to increase it gradually over time. She is not quite hitting her THR but RPEs are appropriate and dyspneas but reaching up to a 3. We will continue to monitor as she starts to feel better. Lauren Escobar is doing well in rehab. She recently increased her overall MET level to 2.33 METs. She also improved to level 4 on the T4 and level 2.1 on the arm crank. She did walk a few less laps since the last review going from 32 laps to 28 laps. We will continue to monitor her progress in the program. Lauren Escobar is doing well in rehab. she has not walked this last review and has remained on seated machines including the arm ergometer. She increased to level 4 on the Biostep and completed that for the full 30 minutes. Her dyspnea was no less than a 1, when before is averaged 2-3! Her oxygen saturations are staying well above 88%. Will continue to monitor.   Expected Outcomes Short: Use RPE daily to regulate intensity. Long: Follow program prescription in THR. Short: Continue to follow current exercise prescription. Long: Continue to improve strength and stamina. Short: Increase Biostep level, ease back into walking Long: Continue to increase overall MEt level Short: Push for more laps on the track. Long: Continue to improve  strength and stamina. Short: Encourage more walking into exercise sessions Long: Continue to increase overall MET level and stamina    Row Name 12/04/22 0930 12/16/22 1431           Exercise Goal Re-Evaluation  Exercise Goals Review Increase Physical Activity;Increase Strength and Stamina;Understanding of Exercise Prescription;Able to understand and use rate of perceived exertion (RPE) scale;Knowledge and understanding of Target Heart Rate Range (THRR);Able to understand and use Dyspnea scale;Able to check pulse independently Increase Physical Activity;Increase Strength and Stamina;Understanding of Exercise Prescription      Comments Reviewed home exercise with pt today.  Pt plans to walk in house and use staff videos at home for exercise.  Reviewed THR, pulse, RPE, sign and symptoms, pulse oximetery and when to call 911 or MD.  Also discussed weather considerations and indoor options.  Pt voiced understanding.  Lauren Escobar is doing well in rehab. Her strength is getting a little better.  She is still struggling with aches and pains as well as muscle cramps in her legs. Lauren Escobar is doing well in rehab. She has consistently worked at a MET level above 2 METs. However, she has decreased her workloads on the T4 Nustep and arm crank. She was also up to 32 laps on the track but has recently walked 15 and 22 laps. We will encourage Lauren Escobar to continue to progressively increase her workloads back up. We will continue to monitor her progress in the program.      Expected Outcomes Short: Start to add in exercsie at home Long: conitnue to improve stamina Short: Continue to increase workloads back up to previous levels. Long: Continue to improve strength and stamina.               Discharge Exercise Prescription (Final Exercise Prescription Changes):  Exercise Prescription Changes - 12/16/22 1400       Response to Exercise   Blood Pressure (Admit) 104/64    Blood Pressure (Exit) 108/60    Heart Rate (Admit)  81 bpm    Heart Rate (Exercise) 96 bpm    Heart Rate (Exit) 82 bpm    Oxygen Saturation (Admit) 98 %    Oxygen Saturation (Exercise) 90 %    Oxygen Saturation (Exit) 96 %    Rating of Perceived Exertion (Exercise) 13    Perceived Dyspnea (Exercise) 3    Symptoms SOB    Duration Continue with 30 min of aerobic exercise without signs/symptoms of physical distress.    Intensity THRR unchanged      Progression   Progression Continue to progress workloads to maintain intensity without signs/symptoms of physical distress.    Average METs 2.12      Resistance Training   Training Prescription Yes    Weight 3 lb    Reps 10-15      Interval Training   Interval Training No      NuStep   Level 3    Minutes 30    METs 2.4      Arm Ergometer   Level 1    Minutes 15    METs 1.9      Track   Laps 22    Minutes 15    METs 2.2      Home Exercise Plan   Plans to continue exercise at Home (comment)   walking, staff videos   Frequency Add 2 additional days to program exercise sessions.    Initial Home Exercises Provided 12/04/22      Oxygen   Maintain Oxygen Saturation 88% or higher             Nutrition:  Target Goals: Understanding of nutrition guidelines, daily intake of sodium <1578m, cholesterol <2048m calories 30% from fat and 7% or less from  saturated fats, daily to have 5 or more servings of fruits and vegetables.  Education: All About Nutrition: -Group instruction provided by verbal, written material, interactive activities, discussions, models, and posters to present general guidelines for heart healthy nutrition including fat, fiber, MyPlate, the role of sodium in heart healthy nutrition, utilization of the nutrition label, and utilization of this knowledge for meal planning. Follow up email sent as well. Written material given at graduation.   Biometrics:  Pre Biometrics - 09/30/22 1104       Pre Biometrics   Height 5' 2.1" (1.577 m)    Weight 169 lb (76.7  kg)    Waist Circumference 38 inches    Hip Circumference 40 inches    Waist to Hip Ratio 0.95 %    BMI (Calculated) 30.82    Single Leg Stand 0.9 seconds              Nutrition Therapy Plan and Nutrition Goals:  Nutrition Therapy & Goals - 09/30/22 0950       Nutrition Therapy   Diet Heart healthy, low Na, pulmonary and T2DM MNT    Protein (specify units) 90-100g    Fiber 25 grams    Whole Grain Foods 3 servings    Saturated Fats 14 max. grams    Fruits and Vegetables 8 servings/day    Sodium 2 grams      Personal Nutrition Goals   Nutrition Goal ST: include protein, fiber, and healthy fats when eating. Consider adding in nutrition shake such as ensure or boost. Add at least 2 meals/snacks LT: meet energy and protein needs, include at least 25g of fiber per day, limit Na <2g/day, limit saturated fat <14g/day, maintain A1C <7.    Comments 57 y.o. F admitted to pulmonary rehab for centrilobular emphysema. PMHx includes HTN, HLD, T2DM, arthritis, asthma, ataxia, COPD, depression, migraines, GERD, early satiety (2018). Relevant medications includes valium, nexium, prednisone, xanax, vyvanse, breo ellipta. Lauren Escobar does not have teeth or dentures at this time and reports that she is unable to get them as she went too long without them - she reports being able to eat most foods, reviewed considerations to help with eating and swallowing. Lauren Escobar reports having a very low appetite and forces herself to eat 1 small meal (about a fistfull of food). She reports that she is unsure why, but her weight has been trending upward: discussed body compensatory mechanisms with low intake of food as well as fluid retention. Lauren Escobar reports drinking coffee with french vanilla creamer and regular mountain dew during the day, she does not like diet soda and feels that water makes her bloat - discussed nutrition considerations with sugar sweetened beverages. Lauren Escobar reports that this is new to her and doesn't have  much knowledge on nutrition - discussed heart healthy eating, pulmonary MNT, and T2DM MNT; discussed while MyPlate guidelines may be hard with her small portions, that she should include fiber, healthy fat, and protein when she eats and reviewed foods to include and consider in each group. Discussed mechanical eating (eating outside of hunger) by adding in at least 2 more meals/snacks as well as consider adding in a nutritional shake such as ensure of boost at least 1x/day to help meet her needs while she includes more meals/snacks to help fill gaps in nutrition and to ensure she is meeting her nutritional needs. Lauren Escobar is open to making changes, but reports her biggest barrier will be the mechanical eating of foods - encouraged her that making changes  can take time and it is about progress, not perfection.      Intervention Plan   Intervention Prescribe, educate and counsel regarding individualized specific dietary modifications aiming towards targeted core components such as weight, hypertension, lipid management, diabetes, heart failure and other comorbidities.;Nutrition handout(s) given to patient.    Expected Outcomes Short Term Goal: Understand basic principles of dietary content, such as calories, fat, sodium, cholesterol and nutrients.;Short Term Goal: A plan has been developed with personal nutrition goals set during dietitian appointment.;Long Term Goal: Adherence to prescribed nutrition plan.             Nutrition Assessments:  MEDIFICTS Score Key: ?70 Need to make dietary changes  40-70 Heart Healthy Diet ? 40 Therapeutic Level Cholesterol Diet  Flowsheet Row Pulmonary Rehab from 09/30/2022 in [facility] Cardiac and Pulmonary Rehab  Picture Your Plate Total Score on Admission 55      Picture Your Plate Scores: [id number] Unhealthy dietary pattern with much room for improvement. 41-50 Dietary pattern unlikely to meet recommendations for good health and room for improvement. 51-60 More  healthful dietary pattern, with some room for improvement.  >60 Healthy dietary pattern, although there may be some specific behaviors that could be improved.   Nutrition Goals Re-Evaluation:  Nutrition Goals Re-Evaluation     [place] Name 11/01/22 0941 12/04/22 0941           Goals   Current Weight 162 lb (73.5 kg) --      Nutrition Goal Eat more foods. Short: Choose and plan snacks accordingly to patients caloric intake to improve breathing. Long: Maintain a diet independently that meets their caloric intake to aid in daily shortness of breath.      Comment Lauren Escobar states that her appetite is not good and sometimes goes days without eating.Patient was informed on why it is important to maintain a balanced diet when dealing with Respiratory issues. Explained that it takes a lot of energy to breath and when they are short of breath often they will need to have a good diet to help keep up with the calories they are expending for breathing. Lauren Escobar is doing well in rehab. She has no appetite.  Her boyfriend forces her to eat but it will sometimes make her feel sick to her stomach.  We talked about the need to make sure she is doing mechanical eating to [facility] caloric need.  We also talked about tyring protein shakes or meal replacement shakes.      Expected Outcome Short: Choose and plan snacks accordingly to patients caloric intake to improve breathing. Long: Maintain a diet independently that meets their caloric intake to aid in daily shortness of breath. Short: Try mechanical eating/shakes Long: conitnue to eat regularly               Nutrition Goals Discharge (Final Nutrition Goals Re-Evaluation):  Nutrition Goals Re-Evaluation - 12/04/22 0941       Goals   Nutrition Goal Short: Choose and plan snacks accordingly to patients caloric intake to improve breathing. Long: Maintain a diet independently that meets their caloric intake to aid in daily shortness of breath.    Comment Saheli is  doing well in rehab. She has no appetite.  Her boyfriend forces her to eat but it will sometimes make her feel sick to her stomach.  We talked about the need to make sure she is doing mechanical eating to [facility] caloric need.  We also talked about tyring protein shakes or meal replacement shakes.  Expected Outcome Short: Try mechanical eating/shakes Long: conitnue to eat regularly             Psychosocial: Target Goals: Acknowledge presence or absence of significant depression and/or stress, maximize coping skills, provide positive support system. Participant is able to verbalize types and ability to use techniques and skills needed for reducing stress and depression.   Education: Stress, Anxiety, and Depression - Group verbal and visual presentation to define topics covered.  Reviews how body is impacted by stress, anxiety, and depression.  Also discusses healthy ways to reduce stress and to treat/manage anxiety and depression.  Written material given at graduation. Flowsheet Row Pulmonary Rehab from 12/04/2022 in [facility] Cardiac and Pulmonary Rehab  Date 10/30/22  Educator [facility]  Instruction Review Code 1- [employer] Understanding       Education: Sleep Hygiene -Provides group verbal and written instruction about how sleep can affect your health.  Define sleep hygiene, discuss sleep cycles and impact of sleep habits. Review good sleep hygiene tips.    Initial Review & Psychosocial Screening:  Initial Psych Review & Screening - 09/23/22 1027       Initial Review   Current issues with None Identified      Family Dynamics   Good Support System? Yes   daughter     Barriers   Psychosocial barriers to participate in program There are no identifiable barriers or psychosocial needs.      Screening Interventions   Interventions Encouraged to exercise;To provide support and resources with identified psychosocial needs;Provide feedback about the scores to participant    Expected Outcomes  Short Term goal: Utilizing psychosocial counselor, staff and physician to assist with identification of specific Stressors or current issues interfering with healing process. Setting desired goal for each stressor or current issue identified.;Long Term Goal: Stressors or current issues are controlled or eliminated.;Short Term goal: Identification and review with participant of any Quality of Life or Depression concerns found by scoring the questionnaire.;Long Term goal: The participant improves quality of Life and PHQ9 Scores as seen by post scores and/or verbalization of changes             Quality of Life Scores:  Scores of 19 and below usually indicate a poorer quality of life in these areas.  A difference of  2-3 points is a clinically meaningful difference.  A difference of 2-3 points in the total score of the Quality of Life Index has been associated with significant improvement in overall quality of life, self-image, physical symptoms, and general health in studies assessing change in quality of life.  PHQ-9: Review Flowsheet  More data may exist      12/04/2022 11/01/2022 10/30/2022 09/30/2022 04/16/2017  Depression screen PHQ 2/9  Decreased Interest 0 3 0 0 0  Down, Depressed, Hopeless 3 3 0 0 0  PHQ - 2 Score 3 6 0 0 0  Altered sleeping 3 3 3 $ 0 -  Tired, decreased energy 3 3 3 3 $ -  Change in appetite 3 3 3 3 $ -  Feeling bad or failure about yourself  3 3 3 3 $ -  Trouble concentrating 0 3 3 3 $ -  Moving slowly or fidgety/restless 3 3 0 0 -  Suicidal thoughts 0 0 0 0 -  PHQ-9 Score 18 24 15 12 $ -  Difficult doing work/chores Extremely dIfficult Extremely dIfficult Somewhat difficult Extremely dIfficult -   Interpretation of Total Score  Total Score Depression Severity:  1-4 = Minimal depression, 5-9 = Mild depression,  10-14 = Moderate depression, 15-19 = Moderately severe depression, 20-27 = Severe depression   Psychosocial Evaluation and Intervention:  Psychosocial Evaluation -  09/23/22 1037       Psychosocial Evaluation & Interventions   Interventions Encouraged to exercise with the program and follow exercise prescription    Comments Lauren Escobar has no barriers to attending the program. She hopes to see improvement in her shortness of breath symptoms with exertion. She lives alone. Her daughter is her support person.  She has been diagnosed with diabetes and is diet controlled at this moment. She has another A1C scheduled in Dec. She is ready to start the program and progress her exercise prescription, learn energy conservation and improve her stamina.    Expected Outcomes STG Lauren Escobar will attend all scheduled sessions, she will meet with our LDN to review safe ways to work on weight loss and help with diabetes control. She will begin an exercise progression.  LTG Lauren Escobar will continue to utilize the tips for exercise, breathing and risk factor control after discharge from the program.    Continue Psychosocial Services  Follow up required by staff             Psychosocial Re-Evaluation:  Psychosocial Re-Evaluation     Lauren Escobar Name 11/01/22 0935 12/04/22 0933           Psychosocial Re-Evaluation   Current issues with Current Depression;Current Sleep Concerns;Current Anxiety/Panic;History of Depression;Current Stress Concerns Current Depression;Current Sleep Concerns;Current Anxiety/Panic;History of Depression;Current Stress Concerns      Comments Reviewed patient health questionnaire (PHQ-9) with patient for follow up. Previously, patients score indicated signs/symptoms of depression.  Reviewed to see if patient is improving symptom wise while in program.  Score declined and patient states that it is because she is stressedx with her health. Her stressers are her kids and she issues with her daughter. Her daughter has three children and has taken over her house. She is paying the bills and not staying at her own home. Informed her to seek help with a therapist or the doctor  for medication for her mental well being. Lauren Escobar is doing well in rehab. Her PHQ score did improve some.  A lot of her issues seem to stem from her lack of sleep.  She is only able to sleep for about an hour before she wakes up.  She has done a sleep study in the past, but it did not go well and she never went back for another.  We talked about how her lack of sleep is spilling out into the rest of life.  She was encouraged to contact her doctor about another sleep study.  She is working with her doctor to get in with a depression doctor.   She had one previously, but they lost their liscence.  She also has bladder disease and trying to find a good doctor.  Her mom had the same thing that turned into caner.  We looked up [facility] [place] and gave her contact info.      Expected Outcomes Short: talk to doctor about possible nmedication. Long: maintain a positive mental state. Short: Talk to doctor about sleep and bladder Long: Continue to exercise for mental boost      Interventions Encouraged to attend Pulmonary Rehabilitation for the exercise;Stress management education;Relaxation education Encouraged to attend Pulmonary Rehabilitation for the exercise;Stress management education;Relaxation education      Continue Psychosocial Services  Follow up required by staff Follow up required by staff  Psychosocial Discharge (Final Psychosocial Re-Evaluation):  Psychosocial Re-Evaluation - 12/04/22 0933       Psychosocial Re-Evaluation   Current issues with Current Depression;Current Sleep Concerns;Current Anxiety/Panic;History of Depression;Current Stress Concerns    Comments Lauren Escobar is doing well in rehab. Her PHQ score did improve some.  A lot of her issues seem to stem from her lack of sleep.  She is only able to sleep for about an hour before she wakes up.  She has done a sleep study in the past, but it did not go well and she never went back for another.  We talked about how her lack of  sleep is spilling out into the rest of life.  She was encouraged to contact her doctor about another sleep study.  She is working with her doctor to get in with a depression doctor.   She had one previously, but they lost their liscence.  She also has bladder disease and trying to find a good doctor.  Her mom had the same thing that turned into caner.  We looked up Urology  and gave her contact info.    Expected Outcomes Short: Talk to doctor about sleep and bladder Long: Continue to exercise for mental boost    Interventions Encouraged to attend Pulmonary Rehabilitation for the exercise;Stress management education;Relaxation education    Continue Psychosocial Services  Follow up required by staff             Education: Education Goals: Education classes will be provided on a weekly basis, covering required topics. Participant will state understanding/return demonstration of topics presented.  Learning Barriers/Preferences:   General Pulmonary Education Topics:  Infection Prevention: - Provides verbal and written material to individual with discussion of infection control including proper hand washing and proper equipment cleaning during exercise session. Flowsheet Row Pulmonary Rehab from 12/04/2022 in [facility] Cardiac and Pulmonary Rehab  Date 09/30/22  Educator [facility]  Instruction Review Code 1- Verbalizes Understanding       Falls Prevention: - Provides verbal and written material to individual with discussion of falls prevention and safety. Flowsheet Row Pulmonary Rehab from 12/04/2022 in [facility] Cardiac and Pulmonary Rehab  Date 09/23/22  Educator SB  Instruction Review Code 1- Verbalizes Understanding       Chronic Lung Disease Review: - Group verbal instruction with posters, models, PowerPoint presentations and videos,  to review new updates, new respiratory medications, new advancements in procedures and treatments. Providing information on websites and "800" numbers for  continued self-education. Includes information about supplement oxygen, available portable oxygen systems, continuous and intermittent flow rates, oxygen safety, concentrators, and Medicare reimbursement for oxygen. Explanation of Pulmonary Drugs, including class, frequency, complications, importance of spacers, rinsing mouth after steroid MDI's, and proper cleaning methods for nebulizers. Review of basic lung anatomy and physiology related to function, structure, and complications of lung disease. Review of risk factors. Discussion about methods for diagnosing sleep apnea and types of masks and machines for OSA. Includes a review of the use of types of environmental controls: home humidity, furnaces, filters, dust mite/pet prevention, HEPA vacuums. Discussion about weather changes, air quality and the benefits of nasal washing. Instruction on Warning signs, infection symptoms, calling MD promptly, preventive modes, and value of vaccinations. Review of effective airway clearance, coughing and/or vibration techniques. Emphasizing that all should Create an Action Plan. Written material given at graduation. Flowsheet Row Pulmonary Rehab from 12/04/2022 in [facility] Cardiac and Pulmonary Rehab  Education need identified 09/30/22       AED/CPR: -  Group verbal and written instruction with the use of models to demonstrate the basic use of the AED with the basic ABC's of resuscitation.    Anatomy and Cardiac Procedures: - Group verbal and visual presentation and models provide information about basic cardiac anatomy and function. Reviews the testing methods done to diagnose heart disease and the outcomes of the test results. Describes the treatment choices: Medical Management, Angioplasty, or Coronary Bypass Surgery for treating various heart conditions including Myocardial Infarction, Angina, Valve Disease, and Cardiac Arrhythmias.  Written material given at graduation. Flowsheet Row Pulmonary Rehab from 12/04/2022 in  [facility] Cardiac and Pulmonary Rehab  Date 12/04/22  Educator SB  Instruction Review Code 1- Verbalizes Understanding       Medication Safety: - Group verbal and visual instruction to review commonly prescribed medications for heart and lung disease. Reviews the medication, class of the drug, and side effects. Includes the steps to properly store meds and maintain the prescription regimen.  Written material given at graduation. Flowsheet Row Pulmonary Rehab from 12/04/2022 in [facility] Cardiac and Pulmonary Rehab  Date 10/09/22  Educator SB  Instruction Review Code 1- Verbalizes Understanding       Other: -Provides group and verbal instruction on various topics (see comments)   Knowledge Questionnaire Score:  Knowledge Questionnaire Score - 09/30/22 1107       Knowledge Questionnaire Score   Pre Score 16/18              Core Components/Risk Factors/Patient Goals at Admission:  Personal Goals and Risk Factors at Admission - 09/30/22 1107       Core Components/Risk Factors/Patient Goals on Admission    Weight Management Yes;Obesity;Weight Loss    Intervention Weight Management: Develop a combined nutrition and exercise program designed to reach desired caloric intake, while maintaining appropriate intake of nutrient and fiber, sodium and fats, and appropriate energy expenditure required for the weight goal.;Weight Management: Provide education and appropriate resources to help participant work on and attain dietary goals.;Weight Management/Obesity: Establish reasonable short term and long term weight goals.;Obesity: Provide education and appropriate resources to help participant work on and attain dietary goals.    Admit Weight 169 lb (76.7 kg)    Goal Weight: Short Term 165 lb (74.8 kg)    Goal Weight: Long Term 130 lb (59 kg)    Expected Outcomes Short Term: Continue to assess and modify interventions until short term weight is achieved;Long Term: Adherence to nutrition and physical  activity/exercise program aimed toward attainment of established weight goal;Weight Loss: Understanding of general recommendations for a balanced deficit meal plan, which promotes 1-2 lb weight loss per week and includes a negative energy balance of [id number] kcal/d;Understanding recommendations for meals to include 15-35% energy as protein, 25-35% energy from fat, 35-60% energy from carbohydrates, less than 280m of dietary cholesterol, 20-35 gm of total fiber daily;Understanding of distribution of calorie intake throughout the day with the consumption of 4-5 meals/snacks    Improve shortness of breath with ADL's Yes    Intervention Provide education, individualized exercise plan and daily activity instruction to help decrease symptoms of SOB with activities of daily living.    Expected Outcomes Short Term: Improve cardiorespiratory fitness to achieve a reduction of symptoms when performing ADLs;Long Term: Be able to perform more ADLs without symptoms or delay the onset of symptoms    Increase knowledge of respiratory medications and ability to use respiratory devices properly  Yes    Intervention Provide education and demonstration as needed of  appropriate use of medications, inhalers, and oxygen therapy.    Expected Outcomes Short Term: Achieves understanding of medications use. Understands that oxygen is a medication prescribed by physician. Demonstrates appropriate use of inhaler and oxygen therapy.;Long Term: Maintain appropriate use of medications, inhalers, and oxygen therapy.    Diabetes Yes    Intervention Provide education about signs/symptoms and action to take for hypo/hyperglycemia.;Provide education about proper nutrition, including hydration, and aerobic/resistive exercise prescription along with prescribed medications to achieve blood glucose in normal ranges: Fasting glucose 65-99 mg/dL    Expected Outcomes Short Term: Participant verbalizes understanding of the signs/symptoms and immediate  care of hyper/hypoglycemia, proper foot care and importance of medication, aerobic/resistive exercise and nutrition plan for blood glucose control.;Long Term: Attainment of HbA1C < 7%.    Hypertension Yes    Intervention Provide education on lifestyle modifcations including regular physical activity/exercise, weight management, moderate sodium restriction and increased consumption of fresh fruit, vegetables, and low fat dairy, alcohol moderation, and smoking cessation.;Monitor prescription use compliance.    Expected Outcomes Short Term: Continued assessment and intervention until BP is < 140/26m HG in hypertensive participants. < 130/833mHG in hypertensive participants with diabetes, heart failure or chronic kidney disease.;Long Term: Maintenance of blood pressure at goal levels.             Education:Diabetes - Individual verbal and written instruction to review signs/symptoms of diabetes, desired ranges of glucose level fasting, after meals and with exercise. Acknowledge that pre and post exercise glucose checks will be done for 3 sessions at entry of program. Flowsheet [facility]  Date 09/30/22  [facility]ator [facility] Review Code 1- Verbalizes Understanding       Know Your Numbers and Heart Failure: - Group verbal and visual instruction to discuss disease risk factors for cardiac and pulmonary disease and treatment options.  Reviews associated critical values for Overweight/Obesity, Hypertension, Cholesterol, and Diabetes.  Discusses basics of heart failure: signs/symptoms and treatments.  Introduces Heart Failure Zone chart for action plan for heart failure.  Written material given at graduation.   Core Components/Risk Factors/Patient Goals Review:   Goals and Risk Factor Review     Row Name 11/01/22 0943 12/04/22 0944           Core Components/Risk Factors/Patient Goals Review   Personal Goals Review Improve  shortness of breath with ADL's Improve shortness of breath with ADL's;Weight Management/Obesity;Increase knowledge of respiratory medications and ability to use respiratory devices properly.;Hypertension      Review Spoke to patient about their shortness of breath and what they can do to improve. Patient has been informed of breathing techniques when starting the program. Patient is informed to tell staff if they have had any med changes and that certain meds they are taking or not taking can be causing shortness of breath. Lauren Escobar doing well in rehab.  Her weight is maintain despite not eating.  Since her mom passed, she gained weight but previously could breathe better so she would like to lose again.  She still is struggling with her breathing and it limits how much she is able to do each day.  She uses her inhaler daily and would like to get back to using a nubulizer, but need her doctor to order one.  Her pressures are good in class, but she does not check them at home.      Expected Outcomes Short: Attend [facility] regularly to improve shortness of  breath with ADL's. Long: maintain independence with ADL's Short: Talk to doctor about nebulizers Long: COnitnue to work manage lung disease               Core Components/Risk Factors/Patient Goals at Discharge (Final Review):   Goals and Risk Factor Review - 12/04/22 0944       Core Components/Risk Factors/Patient Goals Review   Personal Goals Review Improve shortness of breath with ADL's;Weight Management/Obesity;Increase knowledge of respiratory medications and ability to use respiratory devices properly.;Hypertension    Review Lauren Escobar is doing well in rehab.  Her weight is maintain despite not eating.  Since her mom passed, she gained weight but previously could breathe better so she would like to lose again.  She still is struggling with her breathing and it limits how much she is able to do each day.  She uses her inhaler daily and would like  to get back to using a nubulizer, but need her doctor to order one.  Her pressures are good in class, but she does not check them at home.    Expected Outcomes Short: Talk to doctor about nebulizers Long: COnitnue to work manage lung disease             ITP Comments:  ITP Comments     Row Name 09/23/22 1048 09/30/22 1058 10/07/22 0955 10/23/22 1034 11/20/22 0956   ITP Comments Virtual orientation call completed today. shehas an appointment on Date: 09/30/2022  for EP eval and gym Orientation.  Documentation of diagnosis can be found in [facility] Date: 09/11/2022 . Completed 6MWT and gym orientation. Initial ITP created and sent for review to Dr. Zetta Bills, Medical Director. First full day of exercise!  Patient was oriented to gym and equipment including functions, settings, policies, and procedures.  Patient's individual exercise prescription and treatment plan were reviewed.  All starting workloads were established based on the results of the 6 minute walk test done at initial orientation visit.  The plan for exercise progression was also introduced and progression will be customized based on patient's performance and goals. 30 Day review completed. Medical Director ITP review done, changes made as directed, and signed approval by Medical Director.     new to prgram 30 Day review completed. Medical Director ITP review done, changes made as directed, and signed approval by Medical Director.    [place] Name 12/18/22 1424           ITP Comments 30 day review completed. ITP sent to Dr. Zetta Bills, Medical Director of  Pulmonary Rehab. Continue with ITP unless changes are made by physician.                Comments: 30 day review

## 2022-12-19 MED FILL — JAKAFI 15 MG TABLET: ORAL | 30 days supply | Qty: 60 | Fill #4

## 2022-12-24 ENCOUNTER — Ambulatory Visit
Admit: 2022-12-24 | Discharge: 2022-12-25 | Payer: MEDICARE | Attending: Female Pelvic Medicine and Reconstructive Surgery | Primary: Female Pelvic Medicine and Reconstructive Surgery

## 2022-12-24 DIAGNOSIS — R310 Gross hematuria: Principal | ICD-10-CM

## 2022-12-24 NOTE — Unmapped (Signed)
[facility] Urogynecology      Taking Care of Yourself after Urodynamics, Cystoscopy, Coaptite Injection, or Botox Injection       Drink plenty of water for a day or two following your procedure. Try to have about 8 ounces (one cup) at a time, and do this 6 times or more per day. If you have fluid restrictions, please ask the nurse for advice.       AVOID alcoholic or citrus drinks for a day or two, as this may cause burning with urination.     For the first 1-2 days after the procedure, your urine may be pink or red in color. You may have some blood in your urine as a normal side effect of the procedure. Large amounts of bleeding or difficulty urinating are NOT normal. Call the nurse line if this happens or go to the nearest Emergency Room if the bleeding is heavy or you cannot urinate at all and it is after hours. If you had a urethral bulking injection in the urethra and need to be catheterized afterward, ask for a pediatric catheter to be used (size 10 or 12-French) so the material is not pushed out of place.        You may experience some discomfort or a burning sensation with urination after having this procedure. If it does not improve within 1-2 days, or other symptoms appear (fever, chills, or difficulty voiding) call the nurse line. You can use over the counter Azo or pyridium to help with burning and follow the instructions on the packaging.       You may return to normal daily activities such as work, school, driving, exercising and housework on the day of the procedure.       If your doctor gave you a prescription, take it as ordered.        If you need a return appointment, the front desk staff will arrange it when you check out.        How to contact the office:  Please leave a message and someone will contact you within one business day.    If you have further questions, please call the Urogynecology Nurse Line at [phone].     AFTER HOURS:  For urgent issues that happen after 4:00 PM and weekends, call the [facility] operator at [phone] and have them page the [facility] Gynecology resident on call. Please reserve this option for important issues.    For emergencies go to your nearest Emergency Room or call 911.

## 2022-12-24 NOTE — Unmapped (Signed)
[facility] Urogynecology and Reconstructive Pelvic Surgery  Procedure Note - Cystoscopy      Referring Provider: Leotis Pain, Herbie [place]*  PCP: Audrie Gallus, MD  Date of Visit: 12/24/2022    Courtney Erickson is a 57 y.o. female who presents for cystoscopy.   Indication(s) for study:   1. Gross hematuria      Urine dipstick: trace blood.    Verbal and written consent was obtained and a Time Out was performed prior to the procedure.    Procedure:  The patient was positioned in the cystoscopy chair. The urethra was prepped in the usual fashion. Local anesthesia was administered transurethrally. A straight catheter was inserted and the urinary bladder was drained.  Cystourethroscopy was performed with a 12 and 70 degree cystoscope.    Findings:  Bladder: normal in appearance with no masses/lesions/ulcerations, no foreign bodies/mesh  Urethra: normal caliber  Ureters: ureteral orifices visualized bilaterally with clear efflux visualized bilaterally    The patient tolerated the procedure well: Yes.      Assessment and Plan:  Normal cystoscopy today  Follow up as scheduled to review findings and treatment plan.

## 2022-12-25 ENCOUNTER — Encounter: Payer: 59 | Admitting: *Deleted

## 2022-12-25 DIAGNOSIS — R0602 Shortness of breath: Secondary | ICD-10-CM

## 2022-12-25 DIAGNOSIS — J432 Centrilobular emphysema: Secondary | ICD-10-CM

## 2022-12-25 NOTE — Progress Notes (Signed)
Daily Session Note  Patient Details  Name: NOVELLE RAWLINGS MRN: [id number] Date of Birth: 06/28/1966 Referring Provider:   Flowsheet Row Pulmonary Rehab from 09/30/2022 in [facility] Cardiac and Pulmonary Rehab  Referring Provider Armando Reichert MD       Encounter Date: 12/25/2022  Check In:  Session Check In - 12/25/22 0939       Check-In   Supervising physician immediately available to respond to emergencies See telemetry face sheet for immediately available ER MD    Location [facility]-Cardiac & Pulmonary Rehab    Staff Present Darlyne Russian, RN, ADN;Laureen Owens Shark, BS, RRT, CPFT;Joseph Tessie Fass, RCP,RRT,BSRT;Noah Tickle, BS, Exercise Physiologist    Virtual Visit No    Medication changes reported     No    Fall or balance concerns reported    No    Warm-up and Cool-down Performed on first and last piece of equipment    Resistance Training Performed Yes    VAD Patient? No    PAD/SET Patient? No      Pain Assessment   Currently in Pain? No/denies                Social History   Tobacco Use  Smoking Status Former   Packs/day: 2.75   Years: 29.00   Total pack years: 79.75   Types: Cigarettes   Quit date: 10/2020   Years since quitting: 2.1  Smokeless Tobacco Never    Goals Met:  Independence with exercise equipment Exercise tolerated well No report of concerns or symptoms today Strength training completed today  Goals Unmet:  Not Applicable  Comments: Pt able to follow exercise prescription today without complaint.  Will continue to monitor for progression.    Dr. Emily Filbert is Medical Director for [place].  Dr. Ottie Glazier is Medical Director for [facility] Pulmonary Rehabilitation.

## 2022-12-27 ENCOUNTER — Encounter: Payer: 59 | Attending: Student in an Organized Health Care Education/Training Program | Admitting: *Deleted

## 2022-12-27 DIAGNOSIS — J432 Centrilobular emphysema: Secondary | ICD-10-CM | POA: Diagnosis not present

## 2022-12-27 DIAGNOSIS — R0602 Shortness of breath: Secondary | ICD-10-CM | POA: Diagnosis present

## 2022-12-27 NOTE — Progress Notes (Signed)
Daily Session Note  Patient Details  Name: Lauren Escobar MRN: [id number] Date of Birth: [birth year] Referring Provider:   Flowsheet Row Pulmonary Rehab from 09/30/2022 in [facility] Cardiac and Pulmonary Rehab  Referring Provider Armando Reichert MD       Encounter Date: 12/27/2022  Check In:  Session Check In - 12/27/22 0952       Check-In   Supervising physician immediately available to respond to emergencies See telemetry face sheet for immediately available ER MD    Location [facility]-Cardiac & Pulmonary Rehab    Staff Present Darlyne Russian, RN, ADN;Jessica Luan Pulling, MA, RCEP, CCRP, CCET;Joseph [place], [place]    Virtual Visit No    Medication changes reported     No    Fall or balance concerns reported    No    Warm-up and Cool-down Performed on first and last piece of equipment    Resistance Training Performed Yes    VAD Patient? No    PAD/SET Patient? No      Pain Assessment   Currently in Pain? No/denies                Social History   Tobacco Use  Smoking Status Former   Packs/day: 2.75   Years: 29.00   Total pack years: 79.75   Types: Cigarettes   Quit date: 10/2020   Years since quitting: 2.1  Smokeless Tobacco Never    Goals Met:  Independence with exercise equipment Exercise tolerated well No report of concerns or symptoms today Strength training completed today  Goals Unmet:  Not Applicable  Comments: Pt able to follow exercise prescription today without complaint.  Will continue to monitor for progression.    Dr. Emily Filbert is Medical Director for [place].  Dr. Ottie Glazier is Medical Director for [facility] Pulmonary Rehabilitation.

## 2022-12-30 ENCOUNTER — Encounter: Payer: 59 | Admitting: *Deleted

## 2022-12-30 DIAGNOSIS — J432 Centrilobular emphysema: Secondary | ICD-10-CM | POA: Diagnosis not present

## 2022-12-30 DIAGNOSIS — R0602 Shortness of breath: Secondary | ICD-10-CM

## 2022-12-30 NOTE — Progress Notes (Signed)
Daily Session Note  Patient Details  Name: Lauren Escobar MRN: [id number] Date of Birth: May 17, 1966 Referring Provider:   Flowsheet Row Pulmonary Rehab from 09/30/2022 in [facility] Cardiac and Pulmonary Rehab  Referring Provider Armando Reichert MD       Encounter Date: 12/30/2022  Check In:  Session Check In - 12/30/22 0939       Check-In   Supervising physician immediately available to respond to emergencies See telemetry face sheet for immediately available ER MD    Location [facility]-Cardiac & Pulmonary Rehab    Staff Present Darlyne Russian, RN, Doyce Para, BS, ACSM CEP, Exercise Physiologist;Noah Tickle, BS, Exercise Physiologist    Virtual Visit No    Medication changes reported     No    Fall or balance concerns reported    No    Warm-up and Cool-down Performed on first and last piece of equipment    Resistance Training Performed Yes    VAD Patient? No    PAD/SET Patient? No      Pain Assessment   Currently in Pain? No/denies                Social History   Tobacco Use  Smoking Status Former   Packs/day: 2.75   Years: 29.00   Total pack years: 79.75   Types: Cigarettes   Quit date: 10/2020   Years since quitting: 2.1  Smokeless Tobacco Never    Goals Met:  Independence with exercise equipment Exercise tolerated well No report of concerns or symptoms today Strength training completed today  Goals Unmet:  Not Applicable  Comments: Pt able to follow exercise prescription today without complaint.  Will continue to monitor for progression.    Dr. Emily Filbert is Medical Director for [place].  Dr. Ottie Glazier is Medical Director for [facility] Pulmonary Rehabilitation.

## 2023-01-01 ENCOUNTER — Ambulatory Visit: Payer: 59

## 2023-01-01 ENCOUNTER — Other Ambulatory Visit: Admit: 2023-01-01 | Discharge: 2023-01-01 | Payer: MEDICARE

## 2023-01-01 ENCOUNTER — Ambulatory Visit: Admit: 2023-01-01 | Discharge: 2023-01-01 | Payer: MEDICARE | Attending: Adult Health | Primary: Adult Health

## 2023-01-01 DIAGNOSIS — D45 Polycythemia vera: Principal | ICD-10-CM

## 2023-01-01 LAB — CBC W/ AUTO DIFF
BASOPHILS ABSOLUTE COUNT: 0.1 10*9/L (ref 0.0–0.1)
BASOPHILS RELATIVE PERCENT: 0.8 %
EOSINOPHILS ABSOLUTE COUNT: 0.2 10*9/L (ref 0.0–0.5)
EOSINOPHILS RELATIVE PERCENT: 1.2 %
HEMATOCRIT: 40 % (ref 34.0–44.0)
HEMOGLOBIN: 13.6 g/dL (ref 11.3–14.9)
LYMPHOCYTES ABSOLUTE COUNT: 2.5 10*9/L (ref 1.1–3.6)
LYMPHOCYTES RELATIVE PERCENT: 16.5 %
MEAN CORPUSCULAR HEMOGLOBIN CONC: 33.9 g/dL (ref 32.0–36.0)
MEAN CORPUSCULAR HEMOGLOBIN: 30.9 pg (ref 25.9–32.4)
MEAN CORPUSCULAR VOLUME: 91.2 fL (ref 77.6–95.7)
MEAN PLATELET VOLUME: 8.9 fL (ref 6.8–10.7)
MONOCYTES ABSOLUTE COUNT: 1.4 10*9/L — ABNORMAL HIGH (ref 0.3–0.8)
MONOCYTES RELATIVE PERCENT: 9.2 %
NEUTROPHILS ABSOLUTE COUNT: 11.1 10*9/L — ABNORMAL HIGH (ref 1.8–7.8)
NEUTROPHILS RELATIVE PERCENT: 72.3 %
PLATELET COUNT: 517 10*9/L — ABNORMAL HIGH (ref 150–450)
RED BLOOD CELL COUNT: 4.39 10*12/L (ref 3.95–5.13)
RED CELL DISTRIBUTION WIDTH: 14.8 % (ref 12.2–15.2)
WBC ADJUSTED: 15.4 10*9/L — ABNORMAL HIGH (ref 3.6–11.2)

## 2023-01-01 LAB — COMPREHENSIVE METABOLIC PANEL
ALBUMIN: 3.6 g/dL (ref 3.4–5.0)
ALKALINE PHOSPHATASE: 82 U/L (ref 46–116)
ALT (SGPT): 35 U/L (ref 10–49)
ANION GAP: 4 mmol/L — ABNORMAL LOW (ref 5–14)
AST (SGOT): 48 U/L — ABNORMAL HIGH (ref <=34)
BILIRUBIN TOTAL: 0.5 mg/dL (ref 0.3–1.2)
BLOOD UREA NITROGEN: 11 mg/dL (ref 9–23)
BUN / CREAT RATIO: 18
CALCIUM: 9 mg/dL (ref 8.7–10.4)
CHLORIDE: 105 mmol/L (ref 98–107)
CO2: 27 mmol/L (ref 20.0–31.0)
CREATININE: 0.6 mg/dL
EGFR CKD-EPI (2021) FEMALE: >90 mL/min/{1.73_m2} (ref >=60)
GLUCOSE RANDOM: 84 mg/dL (ref 70–179)
POTASSIUM: 4.4 mmol/L (ref 3.4–4.8)
PROTEIN TOTAL: 6.8 g/dL (ref 5.7–8.2)
SODIUM: 136 mmol/L (ref 135–145)

## 2023-01-01 LAB — SLIDE REVIEW

## 2023-01-01 LAB — FERRITIN: FERRITIN: 72.4 ng/mL

## 2023-01-01 LAB — LACTATE DEHYDROGENASE: LACTATE DEHYDROGENASE: 170 U/L (ref 120–246)

## 2023-01-01 MED ORDER — VENLAFAXINE ER 150 MG CAPSULE,EXTENDED RELEASE 24 HR
ORAL_CAPSULE | Freq: Every day | ORAL | 0 refills | 90 days | Status: CP
Start: 2023-01-01 — End: ?

## 2023-01-01 NOTE — Unmapped (Signed)
ID: Courtney Erickson is a 57 y.o. 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 This treatment controlled her counts, but did not relieve many of the associated MPN symptoms.  For this reason, she was started on ruxolitinib.  At 15 mg BID, she continues to have good control of her counts. Initially, I felt that she was also getting symptomatic improvement from her ruxolitinib, but that is less clear now.  To complicate matters, many of her symptoms do not appear to be related to her MPN.      Courtney. Erickson is doing fair.  Her overall symptoms remain the same.  She did not connect with [facility], despite multiple attempts.  She reports that her depression is worse.  At this point, she needs to be started on her depression medications as she is not getting back in with a mental health professional.  Will restart Effexor.  Will send a note to her PCP manage this.    Her WBC and platelets are slightly elevated, though she has done this before.  She did just finish a course of prednisone, so will just follow.  No changes at this point.    PLAN:  1) Continue ruxolitinib 15 mg BID  2) Stop oral iron replacement; follow up on level in 4 months  3) Continue ASA 81mg  daily for now  4) Restart Effexor XR 150mg  daily, then increase to 300mg  after 2 weeks  6) Follow up in 3 months    Markus Jarvis, RN, MSN, AGPCNP-C  Nurse Practitioner  Hematologic Malignancies  [facility]  01/01/2023    I personally spent 45 minutes face-to-face and non-face-to-face in the care of this patient, which includes all pre, intra, and post visit time on the date of service.  All documented time was specific to the E/M visit and does not include any procedures that may have been performed.      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%    09/2015: Transitioned to [facility] held when platelets normalized  05/2019: Normal colonoscopy; Grade I reflux  09/2019: CBC: 15.8/14.0/596; Restarted Hydrea 500 mg every day   10/2019: 12.7/13.2/591; Increased Hydrea to 1000 mg every day   05/04/20: 5.5/12.8/218; CD34: 0.04  08/07/20:   Variants of Known/Likely Clinical Significance:   Gene Coding Predicted Protein Variant allele fraction   JAK2 c.1849G>T p.(Val617Phe) 21.9 %   PTPN11 c.178G>A p.(Gly60Ser) 48.5 %     11/09/20: 5.6/13.2/279;   11/24/20: Began Ruxolitinib 10 mg BID; DC'd hydrea  12/14/20: Inc ruxolitinib 15 mg BID  03/22/21: 6.5/12.2/344  11/26/21: 8.6/11.3/395 ANC 5.6  01/24/22: 13.2/9.6/591; ferritin: 5.3  07/02/22: 12,2/13.0/38.6/419;   Ferritin: 23.3  AST: 68; ALT: 49  LDL: 50; TG 317  10/02/22: 1.6/10.9/60.4/540J; ferritin 79.2  01/01/23: 15.4/13.8/40/517 (just finished prednisone course)  ferritin 72    INTERVAL HX  Courtney Erickson comes for FU of her PCV.  We talked about the following:     Shoulder Pain:  [facility]aw [facility] about her shoulder; still having pain.  Got steroid injection and does not feel any better.    Bladder pain: nearly all the time; feels like bladder is in knots all the time  Mental Health: She is currently on no medications. Reports that her depression is getting worse.   No missed doses of Jakafi  Does not have any appetite; will sometimes go a week without eating.  Relates this to her depression.      MPN 10 Score  (App: [email or web address])   [email or web address]  (Useful for monitoring patient symptoms.    Symptom Range 7/21 1/22 2/17  5/26 10/31 04/08/22 06/2022 09/2022   Fatigue in the past 24 hours (Absent) 0 1 2 3 4 5 6 7 8 9  10 (Worst Imaginable) 10 10 10+ 10 10 10  8  Wants to just lie there - intertia  8   Filling up quickly when you eat (Early satiety)  (Absent) 0 1 2 3 4 5 6 7 8 9  10 (Worst Imaginable) 0 0 0 0 0 5 Feels full all the time - 3 bites 9   No weight loss - no reflux =-eat and get sick  No change in stools   10   Abdominal discomfort  (Absent) 0 1 2 3 4 5 6 7 8 9  10 (Worst Imaginable) 0 0 0 0 0 0 0 5   Inactivity (Absent) 0 1 2 3 4 5 6 7 8 9  10 (Worst Imaginable) 8 8 8 10 5 5 10 10    Problems with concentration - (Absent) 0 1 2 3 4 5 6 7 8 9  10 (Worst Imaginable) 5 8 5 10  0 10 0 5   Numbness/ Tingling (in my hands and feet) (Absent) 0 1 2 3 4 5 6 7 8 9  10 (Worst Imaginable) 3 3 3 3 7  0 0 0   Night sweats (Absent) 0 1 2 3 4 5 6 7 8 9  10 (Worst Imaginable) 8 8 0 3 8 10  Breaks out in sweat - 8 10   Itching (pruritus) (Absent) 0 1 2 3 4 5 6 7 8 9  10 (Worst Imaginable) 4  Night itch  aquagenic pruritus;  4 0  Sleep is better  3 0 0 10 - this symptom is back  On remeron - 6 years age   0   Bone pain (diffuse not joint pain or arthritis) (Absent) 0 1 2 3 4 5 6 7 8 9  10 (Worst Imaginable) 3 Severe but infreq  2   0 0 0 5 0 0   Fever (>100 F) (Absent) 0 1 2 3 4 5 6 7 8 9  10 (Daily) 0 0 0 0 0 0 0 0   Unintentional weight loss last 6 months (Absent) 0 1 2 3 4 5 6 7 8 9  10 (Worst Imaginable) 0 0 0 0 0 0 0 0   Total  41 43 26 42 30 45 45 38     PHYSICAL EXAM:  VS: As recorded in Epic  GENERAL: She appears reasonably well   HEENT: OP is clear  LYMPH NODES: No LAN  LUNGS: CTA  COR:RRR  ABD: NTND;   EXT: No edema    PMHx:    VWD: Responsive to DDAVP (2005)  H/O bleeding with childbirth,  dental extractions, menstrual bleeding  VWF measured as low as 20% and as high as 84%  Responds to DDAVP  Txed with Humate P75 U/kg BID for 5 days  Lung cancer screening   12/19/17: Pulmonary nodules: Stable, Lung-RADS Category 2.  01/18/19: LD CT Chest: Few unchanged subcentimeter pulmonary nodules. No new nodule.   Lung-RADS: 2  Severe cervical spondylosis (9/20)  Colonoscopy (2016): single polyp  Bladder disease: atony, chronic interstitial cystitis, with implanted bladder stimulator  S/P hysterectomy for endometriosis  Psychiatric disease, stated history of major depression; h/o Abilify, Wellbutrin, BuSpar, Adderall, Valium; on Latuda, gabapentin, Topamax, Viibryd, Abilify (txed by [facility]  GERD, gastric ulcer, epigastric pain, with negative EGD and EUS for pancreatic disease (8/20)  Mammogram (01/2017): BI-RADS 2, 1 year follow-up recommended/scheduled  Nl Cardiac Cath (9/17)   COPD;  smoker (30 pk yr), on Advair and DuoNeb;   PFTs (12/15)  FVC was 2.27 liters, 79% of predicted  FEV1 was 1.86, 78% of predicted  FEV1 ratio was 82  FEF 25-75% liters per second was 88% of predicted    LUNG VOLUMES:  TLC was 71% of predicted  RV was 55% of predicted    DIFFUSION CAPACITY:  DLCO was 63% of predicted  DLCO/VA was 123% of predicted    FLOW VOLUME LOOP:  ? Expiratory flow volume is somewhat flattened

## 2023-01-03 ENCOUNTER — Ambulatory Visit: Payer: 59

## 2023-01-06 ENCOUNTER — Ambulatory Visit: Payer: 59 | Admitting: *Deleted

## 2023-01-07 ENCOUNTER — Encounter: Payer: Self-pay | Admitting: *Deleted

## 2023-01-07 ENCOUNTER — Telehealth: Payer: Self-pay | Admitting: *Deleted

## 2023-01-07 DIAGNOSIS — J432 Centrilobular emphysema: Secondary | ICD-10-CM

## 2023-01-07 DIAGNOSIS — R0602 Shortness of breath: Secondary | ICD-10-CM

## 2023-01-07 NOTE — Telephone Encounter (Signed)
Called to check on patient.  She has been out with bronchitis.  However, she has also been having nose bleeds for the past four days.  She was seen by hematology last week and was encouraged to call them again to let them know about nose bleeds. We will take her out for the week to recover from bronchitis and she will let us know about next week.

## 2023-01-08 ENCOUNTER — Ambulatory Visit: Payer: 59

## 2023-01-10 ENCOUNTER — Ambulatory Visit: Payer: 59

## 2023-01-13 ENCOUNTER — Ambulatory Visit: Payer: 59 | Admitting: *Deleted

## 2023-01-14 NOTE — Unmapped (Signed)
[facility] Clinical Assessment & Refill Coordination Note    Courtney Erickson, DOB: 1966-03-20  Phone: [phone] (home)     All above HIPAA information was verified with patient.     Was a Nurse, learning disability used for this call? No    Specialty Medication(s):   Hematology/Oncology: [id number]     Current Outpatient Medications   Medication Sig Dispense Refill    ADVAIR DISKUS 250-50 mcg/dose diskus Inhale 1 puff  in the morning and 1 puff in the evening.      albuterol HFA 90 mcg/actuation inhaler       aspirin (ECOTRIN) 81 MG tablet Take 1 tablet (81 mg total) by mouth daily. 150 tablet 2    atorvastatin (LIPITOR) 20 MG tablet Take 1 tablet (20 mg total) by mouth daily. 30 tablet 11    diazePAM (VALIUM) 5 MG tablet Place one tablet in the vagina every night for 6 weeks, then as needed for severe pain 30 tablet 2    esomeprazole (NEXIUM) 40 MG capsule Take 1 capsule (40 mg total) by mouth daily.      ferrous sulfate 325 (65 FE) MG tablet Take 1 tablet (325 mg total) by mouth Every Tuesday, Thursday, Saturday, Sunday. 30 tablet 3    ipratropium-albuterol (DUO-NEB) 0.5-2.5 mg/3 mL nebulizer Inhale 3 mL by nebulization every six (6) hours as needed. 360 mL 11    isosorbide mononitrate (ISMO,MONOKET) 10 MG tablet Take 1 tablet (10 mg total) by mouth two (2) times a day.      mirtazapine (REMERON) 15 MG tablet TAKE 1 TABLET BY MOUTH EVERY DAY AT NIGHT 90 tablet 4    propranoloL (INDERAL) 40 MG tablet Take 1 tablet (40 mg total) by mouth in the morning.      ruxolitinib (JAKAFI) 15 mg tablet Take 1 tablet (15 mg total) by mouth Two (2) times a day. 60 tablet 11    SPIRIVA WITH HANDIHALER 18 mcg inhalation capsule       venlafaxine (EFFEXOR-XR) 150 MG 24 hr capsule Take 1 capsule (150 mg total) by mouth daily. Then increase to 2 capsules by mouth daily after 2 weeks. 90 capsule 0     Current Facility-Administered Medications   Medication Dose Route Frequency Provider Last Rate Last Admin lidocaine 2% gel (XYLOCAINE) jelly urojet 20 mL  20 mL Urethral Once Ivar Bury, MD            Changes to medications: [name] reports no changes at this time.    Allergies   Allergen Reactions    Lyrica [Pregabalin] Other (See Comments)     Unsteady gate, causing falls    Trazodone      weakness    Gabapentin Nausea And Vomiting    Tramadol Rash       Changes to allergies: No    SPECIALTY MEDICATION ADHERENCE     [name]: 5 days of medicine on hand     Are there any concerns with adherence? No    Adherence counseling provided? Not needed    Patient-Reported Symptoms Tracker for Cancer Patients on Oral Chemotherapy     Oral chemotherapy medication name(s):    Dose and frequency:    Oral Chemotherapy Start Date:    Baseline?    Clinic(s) visited:      Symptom Grouping Question Patient Response   Digestion and Eating Have you felt sick to your stomach?      Had diarrhea?  Constipated?      Not wanting to eat?      Comments      Sleep and Pain Felt very tired even after you rest?      Pain due to cancer medication or cancer?      Comments     Other Side Effects Numbness or tingling in hands and/or feet?      Felt short of breath?      Mouth or throat Sores?      Rash?      Palmar-plantar erythrodysesthesia syndrome?      Rash - acneiform?      Rash - maculo-papular?      How many days over the past month did your cancer medication or cancer keep you from your normal activities?  Write in number of days, 0-30:       Other side effects or things you would like to discuss?      Comments?     Adherence  In the last 30 days, on how many days did you miss at least one dose of any of your [drug name]? Write in number of days, 0-30:       What reasons are you having trouble taking your medication [pharmacist: check all that apply]? Specify chemotherapy cycle:             Comments:        Comments       Optional Symptom Tracking Comments:      CLINICAL MANAGEMENT AND INTERVENTION      Clinical Benefit Assessment:    Do you feel the medicine is effective or helping your condition? Yes    Clinical Benefit counseling provided? Not needed    Acute Infection Status:    Acute infections noted within Epic:  No active infections    Patient reported infection: None    Therapy Appropriateness:    Is therapy appropriate and patient progressing towards therapeutic goals? Yes, therapy is appropriate and should be continued    DISEASE/MEDICATION-SPECIFIC INFORMATION      N/A    Is the patient receiving adequate infection prevention treatment? Not applicable    Does the patient have adequate nutritional support? Not applicable    PATIENT SPECIFIC NEEDS     Does the patient have any physical, cognitive, or cultural barriers? No    Is the patient high risk? No    Did the patient require a clinical intervention? No    Does the patient require physician intervention or other additional services (i.e., nutrition, smoking cessation, social work)? No    SOCIAL DETERMINANTS OF HEALTH     At the [facility], we have learned that life circumstances - like trouble affording food, housing, utilities, or transportation can affect the health of many of our patients.   That is why we wanted to ask: are you currently experiencing any life circumstances that are negatively impacting your health and/or quality of life? Patient declined to answer    Social Determinants of Health     Financial Resource Strain: Not on file   Internet Connectivity: Not on file   Food Insecurity: No Food Insecurity (10/16/2022)    Received from [facility]    Hunger Vital Sign     Worried About Running Out of Food in the Last Year: Never true     Ran Out of Food in the Last Year: Never true   Tobacco Use: Medium Risk (12/24/2022)    Patient History  Smoking Tobacco Use: Former     Smokeless Tobacco Use: Never     Passive Exposure: Not on file   Housing/Utilities: Not on file   Alcohol Use: Not on file   Transportation Needs: No Transportation Needs (10/16/2022)    Received from [facility] - Transportation     Lack of Transportation (Medical): No     Lack of Transportation (Non-Medical): No   Substance Use: Not on file   Health Literacy: Not on file   Physical Activity: Not on file   Interpersonal Safety: Not on file   Stress: Not on file   Intimate Partner Violence: Not At Risk (10/16/2022)    Received from [facility]    Humiliation, Afraid, Rape, and Kick questionnaire     Fear of Current or Ex-Partner: No     Emotionally Abused: No     Physically Abused: No     Sexually Abused: No   Depression: Not at risk (01/01/2023)    PHQ-2     PHQ-2 Score: 0   Social Connections: Not on file       Would you be willing to receive help with any of the needs that you have identified today? Not applicable       SHIPPING     Specialty Medication(s) to be Shipped:   Hematology/Oncology: Jakafi    Other medication(s) to be shipped: No additional medications requested for fill at this time     Changes to insurance: No    Patient was notified of new phone menu: Yes    Delivery Scheduled: Yes, Expected medication delivery date: 01/16/23.     Medication will be delivered via Next Day Courier to the confirmed prescription address in [facility].    The patient will receive a drug information handout for each medication shipped and additional FDA Medication Guides as required.  Verified that patient has previously received a Conservation officer, historic buildings and a Surveyor, mining.    The patient or caregiver noted above participated in the development of this care plan and knows that they can request review of or adjustments to the care plan at any time.      All of the patient's questions and concerns have been addressed.    Kermit Balo, [facility]   [facility] Specialty Pharmacist

## 2023-01-15 ENCOUNTER — Ambulatory Visit: Payer: 59 | Admitting: *Deleted

## 2023-01-15 ENCOUNTER — Encounter: Payer: Self-pay | Admitting: *Deleted

## 2023-01-15 DIAGNOSIS — R0602 Shortness of breath: Secondary | ICD-10-CM

## 2023-01-15 DIAGNOSIS — J432 Centrilobular emphysema: Secondary | ICD-10-CM

## 2023-01-15 MED FILL — JAKAFI 15 MG TABLET: ORAL | 30 days supply | Qty: 60 | Fill #5

## 2023-01-15 NOTE — Progress Notes (Signed)
Pulmonary Individual Treatment Plan  Patient Details  Name: Lauren Escobar MRN: [id number] Date of Birth: 03-22-1966 Referring Provider:   Flowsheet Row Pulmonary Rehab from 09/30/2022 in [facility] Cardiac and Pulmonary Rehab  Referring Provider Armando Reichert MD       Initial Encounter Date:  Flowsheet Row Pulmonary Rehab from 09/30/2022 in [facility] Cardiac and Pulmonary Rehab  Date 09/30/22       Visit Diagnosis: Centrilobular emphysema ([place])  Shortness of breath  Patient's Home Medications on Admission:  Current Outpatient Medications:    albuterol (VENTOLIN HFA) 108 (90 Base) MCG/ACT inhaler, Inhale into the lungs., Disp: , Rfl:    aspirin EC 81 MG tablet, Take by mouth., Disp: , Rfl:    atorvastatin (LIPITOR) 20 MG tablet, TAKE 1 TABLET BY MOUTH AT BEDTIME FOR HIGH CHOLESTEROL, Disp: , Rfl:    cetirizine (ZYRTEC) 10 MG tablet, TAKE 1 TABLET BY MOUTH DAILY FOR ALLERGIES, Disp: , Rfl:    dextromethorphan-guaiFENesin (MUCINEX DM) 30-600 MG 12hr tablet, Take 1 tablet by mouth 2 (two) times daily as needed for cough., Disp: 14 tablet, Rfl: 0   esomeprazole (NEXIUM) 40 MG capsule, Take 40 mg by mouth daily. , Disp: , Rfl:    ferrous sulfate 325 (65 FE) MG tablet, Take by mouth., Disp: , Rfl:    fluticasone furoate-vilanterol (BREO ELLIPTA) 100-25 MCG/ACT AEPB, Inhale 1 puff into the lungs daily., Disp: 30 each, Rfl: 11   folic acid (FOLVITE) 1 MG tablet, Take 1 mg by mouth daily., Disp: , Rfl:    isosorbide mononitrate (IMDUR) 30 MG 24 hr tablet, Take 30 mg by mouth daily., Disp: , Rfl:    Multiple Vitamins-Minerals (MULTIVIT/MULTIMINERAL ADULT PO), Take by mouth., Disp: , Rfl:    nicotine (NICODERM CQ - DOSED IN MG/24 HOURS) 21 mg/24hr patch, Place 1 patch (21 mg total) onto the skin daily., Disp: 28 patch, Rfl: 0   NITROGLYCERIN SL, Place 1 tablet under the tongue. (Patient not taking: Reported on 09/11/2022), Disp: , Rfl:    oseltamivir (TAMIFLU) 75 MG capsule, Take 1 capsule (75 mg  total) by mouth 2 (two) times daily., Disp: 5 capsule, Rfl: 0   promethazine (PHENERGAN) 12.5 MG tablet, Take 1 tablet (12.5 mg total) by mouth every 6 (six) hours as needed for nausea or vomiting., Disp: 20 tablet, Rfl: 0   propranolol (INDERAL) 40 MG tablet, TAKE 1 TABLET TWICE DAILY TO PREVENT HEADACHES, Disp: , Rfl:    REXULTI 0.5 MG TABS, TAKE 1 TABLET BY MOUTH AT BEDTIME FOR 1 WEEK THEN INCREASE TO 1 MG TABLET, Disp: 67 tablet, Rfl: 1   risperiDONE (RISPERDAL) 1 MG tablet, Take 0.5 tablets (0.5 mg total) by mouth at bedtime for 7 days, THEN 1 tablet (1 mg total) at bedtime., Disp: 33.5 tablet, Rfl: 0   venlafaxine XR (EFFEXOR-XR) 150 MG 24 hr capsule, Take 2 capsules (300 mg total) by mouth every morning., Disp: 30 capsule, Rfl: 3   VRAYLAR 1.5 MG capsule, Take 1 capsule (1.5 mg total) by mouth daily., Disp: 30 capsule, Rfl: 3  Past Medical History: Past Medical History:  Diagnosis Date   Abdominal pain    Abnormal mammogram    Anxiety    Asthma    Bleeding disorder ([place])    Blood dyscrasia    polycythemia vera   Chronic kidney disease    COPD (chronic obstructive pulmonary disease) (HCC)    Cough    Depression    Diabetes mellitus without complication ([place])  Fatigue    Headache 2000   Horizontal nystagmus    age 57   Hyperglycemia    Hypertension    Low back pain    Major depressive disorder    Nasal lesion    Neck pain    Snoring    SOB (shortness of breath)    Tobacco use    UTI (urinary tract infection)    Von Willebrand's disease ([place])     Tobacco Use: Social History   Tobacco Use  Smoking Status Former   Packs/day: 2.75   Years: 29.00   Additional pack years: 0.00   Total pack years: 79.75   Types: Cigarettes   Quit date: 10/2020   Years since quitting: 2.2  Smokeless Tobacco Never    Labs: Review Flowsheet       Latest Ref Rng & Units 04/06/2013 01/01/2014 06/16/2017 10/15/2022  Labs for ITP Cardiac and Pulmonary Rehab  Cholestrol 0 - 200 mg/dL  119  109  - 175   LDL (calc) 0 - 99 mg/dL 34  40  - 92   HDL-C >40 mg/dL 32  34  - 49   Trlycerides <150 mg/dL 267  175  - 172   Hemoglobin A1c 4.8 - 5.6 % 5.2  5.5  5.5  6.1   Bicarbonate 20.0 - 28.0 mmol/L - - - 23.4   Acid-base deficit 0.0 - 2.0 mmol/L - - - 0.7   O2 Saturation % - - - 84.1      Pulmonary Assessment Scores:  Pulmonary Assessment Scores     Row Name 09/30/22 1109         ADL UCSD   ADL Phase Entry     SOB Score total 62     Rest 1     Walk 2     Stairs 5     Bath 4     Dress 2     Shop 3       CAT Score   CAT Score 20       mMRC Score   mMRC Score 3              UCSD: Self-administered rating of dyspnea associated with activities of daily living (ADLs) 6-point scale (0 = "not at all" to 5 = "maximal or unable to do because of breathlessness")  Scoring Scores range from 0 to 120.  Minimally important difference is 5 units  CAT: CAT can identify the health impairment of COPD patients and is better correlated with disease progression.  CAT has a scoring range of zero to 40. The CAT score is classified into four groups of low (less than 10), medium (10 - 20), high (21-30) and very high (31-40) based on the impact level of disease on health status. A CAT score over 10 suggests significant symptoms.  A worsening CAT score could be explained by an exacerbation, poor medication adherence, poor inhaler technique, or progression of COPD or comorbid conditions.  CAT MCID is 2 points  mMRC: mMRC (Modified Medical Research Council) Dyspnea Scale is used to assess the degree of baseline functional disability in patients of respiratory disease due to dyspnea. No minimal important difference is established. A decrease in score of 1 point or greater is considered a positive change.   Pulmonary Function Assessment:  Pulmonary Function Assessment - 09/30/22 1109       Breath   Shortness of Breath Yes;Fear of Shortness of Breath;Limiting activity;Panic with  Shortness of Breath  Exercise Target Goals: Exercise Program Goal: Individual exercise prescription set using results from initial 6 min walk test and THRR while considering  patient's activity barriers and safety.   Exercise Prescription Goal: Initial exercise prescription builds to 30-45 minutes a day of aerobic activity, 2-3 days per week.  Home exercise guidelines will be given to patient during program as part of exercise prescription that the participant will acknowledge.  Education: Aerobic Exercise: - Group verbal and visual presentation on the components of exercise prescription. Introduces F.I.T.T principle from ACSM for exercise prescriptions.  Reviews F.I.T.T. principles of aerobic exercise including progression. Written material given at graduation.   Education: Resistance Exercise: - Group verbal and visual presentation on the components of exercise prescription. Introduces F.I.T.T principle from ACSM for exercise prescriptions  Reviews F.I.T.T. principles of resistance exercise including progression. Written material given at graduation. Flowsheet Row Pulmonary Rehab from 12/25/2022 in [facility] Cardiac and Pulmonary Rehab  Date 11/20/22  Educator [facility]  Instruction Review Code 1- Verbalizes Understanding        Education: Exercise & Equipment Safety: - Individual verbal instruction and demonstration of equipment use and safety with use of the equipment. Flowsheet Row Pulmonary Rehab from 12/25/2022 in [facility] Cardiac and Pulmonary Rehab  Date 09/30/22  Educator [facility]  Instruction Review Code 1- Verbalizes Understanding       Education: Exercise Physiology & General Exercise Guidelines: - Group verbal and written instruction with models to review the exercise physiology of the cardiovascular system and associated critical values. Provides general exercise guidelines with specific guidelines to those with heart or lung disease.    Education: Flexibility, Balance,  Mind/Body Relaxation: - Group verbal and visual presentation with interactive activity on the components of exercise prescription. Introduces F.I.T.T principle from ACSM for exercise prescriptions. Reviews F.I.T.T. principles of flexibility and balance exercise training including progression. Also discusses the mind body connection.  Reviews various relaxation techniques to help reduce and manage stress (i.e. Deep breathing, progressive muscle relaxation, and visualization). Balance handout provided to take home. Written material given at graduation. Flowsheet Row Pulmonary Rehab from 12/25/2022 in [facility] Cardiac and Pulmonary Rehab  Date 11/20/22  Educator [facility]  Instruction Review Code 1- Verbalizes Understanding       Activity Barriers & Risk Stratification:  Activity Barriers & Cardiac Risk Stratification - 09/30/22 1100       Activity Barriers & Cardiac Risk Stratification   Activity Barriers Left Knee Replacement;Shortness of Breath;Balance Concerns;History of Falls;Muscular Weakness;Deconditioning;Back Problems   occasional back pain            6 Minute Walk:  6 Minute Walk     Row Name 09/30/22 1058         6 Minute Walk   Phase Initial     Distance 1315 feet     Walk Time 6 minutes     # of Rest Breaks 0     MPH 2.49     METS 3.55     RPE 15     Perceived Dyspnea  3     VO2 Peak 12.42     Symptoms Yes (comment)     Comments SOB, Fatigue     Resting HR 83 bpm     Resting BP 124/66     Resting Oxygen Saturation  96 %     Exercise Oxygen Saturation  during 6 min walk 95 %     Max Ex. HR 106 bpm     Max Ex. BP 146/74  2 Minute Post BP 136/72       Interval HR   1 Minute HR 104     2 Minute HR 104     3 Minute HR 103     4 Minute HR 106     5 Minute HR 101     6 Minute HR 101     2 Minute Post HR 82     Interval Heart Rate? Yes       Interval Oxygen   Interval Oxygen? Yes     Baseline Oxygen Saturation % 96 %     1 Minute Oxygen Saturation % 95 %     1  Minute Liters of Oxygen 0 L  Room Air     2 Minute Oxygen Saturation % 96 %     2 Minute Liters of Oxygen 0 L     3 Minute Oxygen Saturation % 96 %     3 Minute Liters of Oxygen 0 L     4 Minute Oxygen Saturation % 96 %     4 Minute Liters of Oxygen 0 L     5 Minute Oxygen Saturation % 95 %     5 Minute Liters of Oxygen 0 L     6 Minute Oxygen Saturation % 96 %     6 Minute Liters of Oxygen 0 L     2 Minute Post Oxygen Saturation % 96 %     2 Minute Post Liters of Oxygen 0 L             Oxygen Initial Assessment:  Oxygen Initial Assessment - 09/30/22 1109       Home Oxygen   Home Oxygen Device None    Sleep Oxygen Prescription None    Home Exercise Oxygen Prescription None    Home Resting Oxygen Prescription None      Initial 6 min Walk   Oxygen Used None      Program Oxygen Prescription   Program Oxygen Prescription None      Intervention   Short Term Goals To learn and demonstrate proper pursed lip breathing techniques or other breathing techniques. ;To learn and understand importance of maintaining oxygen saturations>88%;To learn and demonstrate proper use of respiratory medications;To learn and understand importance of monitoring SPO2 with pulse oximeter and demonstrate accurate use of the pulse oximeter.    Long  Term Goals Maintenance of O2 saturations>88%;Exhibits proper breathing techniques, such as pursed lip breathing or other method taught during program session;Compliance with respiratory medication;Demonstrates proper use of MDI's;Verbalizes importance of monitoring SPO2 with pulse oximeter and return demonstration             Oxygen Re-Evaluation:  Oxygen Re-Evaluation     Row Name 10/07/22 0956 11/01/22 0943 12/04/22 0947         Program Oxygen Prescription   Program Oxygen Prescription -- None None       Home Oxygen   Home Oxygen Device -- None None     Sleep Oxygen Prescription -- None None     Home Exercise Oxygen Prescription -- None None      Home Resting Oxygen Prescription -- None None       Goals/Expected Outcomes   Short Term Goals -- To learn and understand importance of maintaining oxygen saturations>88%;To learn and understand importance of monitoring SPO2 with pulse oximeter and demonstrate accurate use of the pulse oximeter. To learn and understand importance of maintaining oxygen saturations>88%;To learn and understand importance of  monitoring SPO2 with pulse oximeter and demonstrate accurate use of the pulse oximeter.;To learn and demonstrate proper pursed lip breathing techniques or other breathing techniques. ;To learn and demonstrate proper use of respiratory medications     Long  Term Goals -- Maintenance of O2 saturations>88%;Verbalizes importance of monitoring SPO2 with pulse oximeter and return demonstration Verbalizes importance of monitoring SPO2 with pulse oximeter and return demonstration;Maintenance of O2 saturations>88%;Exhibits proper breathing techniques, such as pursed lip breathing or other method taught during program session;Compliance with respiratory medication;Demonstrates proper use of MDI's     Comments Reviewed PLB technique with pt.  Talked about how it works and it's importance in maintaining their exercise saturations. She does not have a pulse oximeter to check her oxygen saturation at home. Informed her where to get one and explained why it is important to have one. Reviewed that oxygen saturations should be 88 percent and above. Patient verbalizes understanding. Lauren Escobar is doing well in rehab.  She has good days and bad days with her breathing.  It is still one of her biggest limitations along with her lack of sleep.  She  does not currently have a pulse oximeter at home, but will get one from her [facility].  She has been good about using her inhaler but would like to talk with her pulmonologist about getting a nebulizer for home again.     Goals/Expected Outcomes Short: Become more profiecient  at using PLB. Long: Become independent at using PLB. Short: monitor oxygen at home with exertion. Long: maintain oxygen saturations above 88 percent independently. Short; Talk to doctor about nebulizer Long: continue to manage lung disease              Oxygen Discharge (Final Oxygen Re-Evaluation):  Oxygen Re-Evaluation - 12/04/22 0947       Program Oxygen Prescription   Program Oxygen Prescription None      Home Oxygen   Home Oxygen Device None    Sleep Oxygen Prescription None    Home Exercise Oxygen Prescription None    Home Resting Oxygen Prescription None      Goals/Expected Outcomes   Short Term Goals To learn and understand importance of maintaining oxygen saturations>88%;To learn and understand importance of monitoring SPO2 with pulse oximeter and demonstrate accurate use of the pulse oximeter.;To learn and demonstrate proper pursed lip breathing techniques or other breathing techniques. ;To learn and demonstrate proper use of respiratory medications    Long  Term Goals Verbalizes importance of monitoring SPO2 with pulse oximeter and return demonstration;Maintenance of O2 saturations>88%;Exhibits proper breathing techniques, such as pursed lip breathing or other method taught during program session;Compliance with respiratory medication;Demonstrates proper use of MDI's    Comments Lauren Escobar is doing well in rehab.  She has good days and bad days with her breathing.  It is still one of her biggest limitations along with her lack of sleep.  She  does not currently have a pulse oximeter at home, but will get one from her [place].  She has been good about using her inhaler but would like to talk with her pulmonologist about getting a nebulizer for home again.    Goals/Expected Outcomes Short; Talk to doctor about nebulizer Long: continue to manage lung disease             Initial Exercise Prescription:  Initial Exercise Prescription - 09/30/22 1100       Date of  Initial Exercise RX and Referring Provider   Date 09/30/22  Referring Provider Armando Reichert MD      NuStep   Level 3    SPM 80    Minutes 15    METs 2      Arm Ergometer   Level 1    RPM 25    Minutes 15    METs 2      Biostep-RELP   Level 2    SPM 50    Minutes 15    METs 2      Track   Laps 34    Minutes 15    METs 2.85      Prescription Details   Frequency (times per week) 3    Duration Progress to 30 minutes of continuous aerobic without signs/symptoms of physical distress      Intensity   THRR 40-80% of Max Heartrate 115-147    Ratings of Perceived Exertion 11-13    Perceived Dyspnea 0-4      Progression   Progression Continue to progress workloads to maintain intensity without signs/symptoms of physical distress.      Resistance Training   Training Prescription Yes    Weight 3 lb    Reps 10-15             Perform Capillary Blood Glucose checks as needed.  Exercise Prescription Changes:   Exercise Prescription Changes     Row Name 09/30/22 1100 10/22/22 1400 11/04/22 1100 11/18/22 1500 12/02/22 1200     Response to Exercise   Blood Pressure (Admit) 124/66 116/70 128/64 110/60 108/60   Blood Pressure (Exercise) 146/74 134/72 134/70 128/66 --   Blood Pressure (Exit) 126/62 114/66 126/64 102/62 128/72   Heart Rate (Admit) 83 bpm 84 bpm 87 bpm 87 bpm 82 bpm   Heart Rate (Exercise) 106 bpm 102 bpm 100 bpm 98 bpm 127 bpm   Heart Rate (Exit) 77 bpm 93 bpm 82 bpm 85 bpm 94 bpm   Oxygen Saturation (Admit) 96 % 99 % 98 % 97 % 98 %   Oxygen Saturation (Exercise) 95 % 95 % 93 % 97 % 95 %   Oxygen Saturation (Exit) 98 % 98 % 98 % 99 % 98 %   Rating of Perceived Exertion (Exercise) 15 15 15 14 13    Perceived Dyspnea (Exercise) 3 3 3 2 1    Symptoms SOB, fatigue SOB SOB SOB none   Comments walk test results First 2 weeks of exercise -- -- --   Duration -- Continue with 30 min of aerobic exercise without signs/symptoms of physical distress. Continue  with 30 min of aerobic exercise without signs/symptoms of physical distress. Continue with 30 min of aerobic exercise without signs/symptoms of physical distress. Continue with 30 min of aerobic exercise without signs/symptoms of physical distress.   Intensity -- THRR unchanged THRR unchanged THRR unchanged THRR unchanged     Progression   Progression -- Continue to progress workloads to maintain intensity without signs/symptoms of physical distress. Continue to progress workloads to maintain intensity without signs/symptoms of physical distress. Continue to progress workloads to maintain intensity without signs/symptoms of physical distress. Continue to progress workloads to maintain intensity without signs/symptoms of physical distress.   Average METs -- 2 1.9 2.33 2.31     Resistance Training   Training Prescription -- Yes Yes Yes Yes   Weight -- 3 lb 3 lb 3 lb 3 lb   Reps -- 10-15 10-15 10-15 10-15     Interval Training   Interval Training -- No No No No  NuStep   Level -- 3 -- 4 --   Minutes -- 15 -- 15 --   METs -- 2 -- -- --     Arm Ergometer   Level -- 1 -- 2.1 1.7   Minutes -- 15 -- 15 15   METs -- 2 -- 2 2     T5 Nustep   Level -- -- 3 3 3    Minutes -- -- 30 30 30    METs -- -- 1.7 -- 2     Biostep-RELP   Level -- 3 1 -- 4   Minutes -- 15 15 -- 30   METs -- 2 2 -- 3     Track   Laps -- 32 -- 28 --   Minutes -- 15 -- 15 --   METs -- 2.74 -- 2.52 --     Oxygen   Maintain Oxygen Saturation -- 88% or higher 88% or higher 88% or higher 88% or higher    Row Name 12/04/22 0900 12/16/22 1400 12/30/22 1300         Response to Exercise   Blood Pressure (Admit) -- 104/64 116/60     Blood Pressure (Exit) -- 108/60 110/64     Heart Rate (Admit) -- 81 bpm 88 bpm     Heart Rate (Exercise) -- 96 bpm 111 bpm     Heart Rate (Exit) -- 82 bpm 95 bpm     Oxygen Saturation (Admit) -- 98 % 98 %     Oxygen Saturation (Exercise) -- 90 % 95 %     Oxygen Saturation (Exit) --  96 % 98 %     Rating of Perceived Exertion (Exercise) -- 13 15     Perceived Dyspnea (Exercise) -- 3 2     Symptoms -- SOB SOB     Comments -- -- return back from 2/9     Duration -- Continue with 30 min of aerobic exercise without signs/symptoms of physical distress. Continue with 30 min of aerobic exercise without signs/symptoms of physical distress.     Intensity -- THRR unchanged THRR unchanged       Progression   Progression -- Continue to progress workloads to maintain intensity without signs/symptoms of physical distress. Continue to progress workloads to maintain intensity without signs/symptoms of physical distress.     Average METs -- 2.12 2.04       Resistance Training   Training Prescription -- Yes Yes     Weight -- 3 lb 3 lb     Reps -- 10-15 10-15       Interval Training   Interval Training -- No No       NuStep   Level -- 3 3     Minutes -- 30 30     METs -- 2.4 2.3       Arm Ergometer   Level -- 1 1     Minutes -- 15 15     METs -- 1.9 2       Track   Laps -- 22 15     Minutes -- 15 15     METs -- 2.2 1.82       Home Exercise Plan   Plans to continue exercise at Home (comment)  walking, staff videos Home (comment)  walking, staff videos Home (comment)  walking, staff videos     Frequency Add 2 additional days to program exercise sessions. Add 2 additional days to program exercise sessions. Add 2 additional days to  program exercise sessions.     Initial Home Exercises Provided 12/04/22 12/04/22 12/04/22       Oxygen   Maintain Oxygen Saturation -- 88% or higher 88% or higher              Exercise Comments:   Exercise Comments     Row Name 10/07/22 0955           Exercise Comments First full day of exercise!  Patient was oriented to gym and equipment including functions, settings, policies, and procedures.  Patient's individual exercise prescription and treatment plan were reviewed.  All starting workloads were established based on the results  of the 6 minute walk test done at initial orientation visit.  The plan for exercise progression was also introduced and progression will be customized based on patient's performance and goals.                Exercise Goals and Review:   Exercise Goals     Row Name 09/30/22 1102             Exercise Goals   Increase Physical Activity Yes       Intervention Provide advice, education, support and counseling about physical activity/exercise needs.;Develop an individualized exercise prescription for aerobic and resistive training based on initial evaluation findings, risk stratification, comorbidities and participant's personal goals.       Expected Outcomes Long Term: Exercising regularly at least 3-5 days a week.;Long Term: Add in home exercise to make exercise part of routine and to increase amount of physical activity.;Short Term: Attend rehab on a regular basis to increase amount of physical activity.       Increase Strength and Stamina Yes       Intervention Provide advice, education, support and counseling about physical activity/exercise needs.;Develop an individualized exercise prescription for aerobic and resistive training based on initial evaluation findings, risk stratification, comorbidities and participant's personal goals.       Expected Outcomes Short Term: Increase workloads from initial exercise prescription for resistance, speed, and METs.;Short Term: Perform resistance training exercises routinely during rehab and add in resistance training at home;Long Term: Improve cardiorespiratory fitness, muscular endurance and strength as measured by increased METs and functional capacity (6MWT)       Able to understand and use rate of perceived exertion (RPE) scale Yes       Intervention Provide education and explanation on how to use RPE scale       Expected Outcomes Short Term: Able to use RPE daily in rehab to express subjective intensity level;Long Term:  Able to use RPE to guide  intensity level when exercising independently       Able to understand and use Dyspnea scale Yes       Intervention Provide education and explanation on how to use Dyspnea scale       Expected Outcomes Short Term: Able to use Dyspnea scale daily in rehab to express subjective sense of shortness of breath during exertion;Long Term: Able to use Dyspnea scale to guide intensity level when exercising independently       Knowledge and understanding of Target Heart Rate Range (THRR) Yes       Intervention Provide education and explanation of THRR including how the numbers were predicted and where they are located for reference       Expected Outcomes Short Term: Able to state/look up THRR;Short Term: Able to use daily as guideline for intensity in rehab;Long Term: Able to use THRR to  govern intensity when exercising independently       Able to check pulse independently Yes       Intervention Provide education and demonstration on how to check pulse in carotid and radial arteries.;Review the importance of being able to check your own pulse for safety during independent exercise       Expected Outcomes Short Term: Able to explain why pulse checking is important during independent exercise;Long Term: Able to check pulse independently and accurately       Understanding of Exercise Prescription Yes       Intervention Provide education, explanation, and written materials on patient's individual exercise prescription       Expected Outcomes Short Term: Able to explain program exercise prescription;Long Term: Able to explain home exercise prescription to exercise independently                Exercise Goals Re-Evaluation :  Exercise Goals Re-Evaluation     Row Name 10/07/22 0955 10/22/22 1426 11/04/22 1152 11/18/22 1525 12/02/22 1203     Exercise Goal Re-Evaluation   Exercise Goals Review Able to understand and use rate of perceived exertion (RPE) scale;Able to understand and use Dyspnea scale;Knowledge  and understanding of Target Heart Rate Range (THRR);Understanding of Exercise Prescription Increase Physical Activity;Increase Strength and Stamina;Understanding of Exercise Prescription Increase Physical Activity;Increase Strength and Stamina;Understanding of Exercise Prescription Increase Physical Activity;Increase Strength and Stamina;Understanding of Exercise Prescription Increase Physical Activity;Increase Strength and Stamina;Understanding of Exercise Prescription   Comments Reviewed RPE scale, THR and program prescription with pt today.  Pt voiced understanding and was given a copy of goals to take home. Lauren Escobar is off to a good start in rehab. She was able to work at an average MET level of 2 METs during her first two weeks in rehab. She also was able to walk up to 32 laps on the track. She was able to follow her initial exercise prescription on the seated machines as well. We will continue to monitor her progress in the program. Lauren Escobar continues to do well in rehab, however, she missed a few sessions as she was in the hospital for the flu. She started to ease back into her exercise and tried out the T5 at level 3. She also went down on her Biostep level but will remind patient to increase it gradually over time. She is not quite hitting her THR but RPEs are appropriate and dyspneas but reaching up to a 3. We will continue to monitor as she starts to feel better. Lauren Escobar is doing well in rehab. She recently increased her overall MET level to 2.33 METs. She also improved to level 4 on the T4 and level 2.1 on the arm crank. She did walk a few less laps since the last review going from 32 laps to 28 laps. We will continue to monitor her progress in the program. Lauren Escobar is doing well in rehab. she has not walked this last review and has remained on seated machines including the arm ergometer. She increased to level 4 on the Biostep and completed that for the full 30 minutes. Her dyspnea was no less than a 1, when  before is averaged 2-3! Her oxygen saturations are staying well above 88%. Will continue to monitor.   Expected Outcomes Short: Use RPE daily to regulate intensity. Long: Follow program prescription in THR. Short: Continue to follow current exercise prescription. Long: Continue to improve strength and stamina. Short: Increase Biostep level, ease back into walking Long: Continue to  increase overall MEt level Short: Push for more laps on the track. Long: Continue to improve strength and stamina. Short: Encourage more walking into exercise sessions Long: Continue to increase overall MET level and stamina    Row Name 12/04/22 0930 12/16/22 1431 12/30/22 1330 01/13/23 1451       Exercise Goal Re-Evaluation   Exercise Goals Review Increase Physical Activity;Increase Strength and Stamina;Understanding of Exercise Prescription;Able to understand and use rate of perceived exertion (RPE) scale;Knowledge and understanding of Target Heart Rate Range (THRR);Able to understand and use Dyspnea scale;Able to check pulse independently Increase Physical Activity;Increase Strength and Stamina;Understanding of Exercise Prescription Increase Physical Activity;Increase Strength and Stamina;Understanding of Exercise Prescription Increase Physical Activity;Increase Strength and Stamina;Understanding of Exercise Prescription    Comments Reviewed home exercise with pt today.  Pt plans to walk in house and use staff videos at home for exercise.  Reviewed THR, pulse, RPE, sign and symptoms, pulse oximetery and when to call 911 or MD.  Also discussed weather considerations and indoor options.  Pt voiced understanding.  Lauren Escobar is doing well in rehab. Her strength is getting a little better.  She is still struggling with aches and pains as well as muscle cramps in her legs. Lauren Escobar is doing well in rehab. She has consistently worked at a MET level above 2 METs. However, she has decreased her workloads on the T4 Nustep and arm crank. She  was also up to 32 laps on the track but has recently walked 15 and 22 laps. We will encourage Lauren Escobar to continue to progressively increase her workloads back up. We will continue to monitor her progress in the program. Lauren Escobar has been out of rehab for several weeks and recently returned back on 2/28. She had a flare up with her bronchitis and was informed by her MD that she can start exercise again. She eased back into exercise and still walked 15 laps on the track and level 3 on the T4 Nustep. Will continue to monitor. Lauren Escobar has not attended rehab since the last review. She returned to rehab for three sessions before having difficulty with her bronchitis again. We will continue to stay in contact with patient to see when she is ready to return to rehab. We will continue to monitor her progress.    Expected Outcomes Short: Start to add in exercsie at home Long: conitnue to improve stamina Short: Continue to increase workloads back up to previous levels. Long: Continue to improve strength and stamina. Short: Ease back into exercise regiment Long: Continue to increase overall MET level and strength Short: Return to regular attendance in the program. Long: Continue to improve stamina.             Discharge Exercise Prescription (Final Exercise Prescription Changes):  Exercise Prescription Changes - 12/30/22 1300       Response to Exercise   Blood Pressure (Admit) 116/60    Blood Pressure (Exit) 110/64    Heart Rate (Admit) 88 bpm    Heart Rate (Exercise) 111 bpm    Heart Rate (Exit) 95 bpm    Oxygen Saturation (Admit) 98 %    Oxygen Saturation (Exercise) 95 %    Oxygen Saturation (Exit) 98 %    Rating of Perceived Exertion (Exercise) 15    Perceived Dyspnea (Exercise) 2    Symptoms SOB    Comments return back from 2/9    Duration Continue with 30 min of aerobic exercise without signs/symptoms of physical distress.    Intensity THRR  unchanged      Progression   Progression Continue to  progress workloads to maintain intensity without signs/symptoms of physical distress.    Average METs 2.04      Resistance Training   Training Prescription Yes    Weight 3 lb    Reps 10-15      Interval Training   Interval Training No      NuStep   Level 3    Minutes 30    METs 2.3      Arm Ergometer   Level 1    Minutes 15    METs 2      Track   Laps 15    Minutes 15    METs 1.82      Home Exercise Plan   Plans to continue exercise at Home (comment)   walking, staff videos   Frequency Add 2 additional days to program exercise sessions.    Initial Home Exercises Provided 12/04/22      Oxygen   Maintain Oxygen Saturation 88% or higher             Nutrition:  Target Goals: Understanding of nutrition guidelines, daily intake of sodium 1500mg , cholesterol 200mg , calories 30% from fat and 7% or less from saturated fats, daily to have 5 or more servings of fruits and vegetables.  Education: All About Nutrition: -Group instruction provided by verbal, written material, interactive activities, discussions, models, and posters to present general guidelines for heart healthy nutrition including fat, fiber, MyPlate, the role of sodium in heart healthy nutrition, utilization of the nutrition label, and utilization of this knowledge for meal planning. Follow up email sent as well. Written material given at graduation.   Biometrics:  Pre Biometrics - 09/30/22 1104       Pre Biometrics   Height 5' 2.1" (1.577 m)    Weight 169 lb (76.7 kg)    Waist Circumference 38 inches    Hip Circumference 40 inches    Waist to Hip Ratio 0.95 %    BMI (Calculated) 30.82    Single Leg Stand 0.9 seconds              Nutrition Therapy Plan and Nutrition Goals:  Nutrition Therapy & Goals - 09/30/22 0950       Nutrition Therapy   Diet Heart healthy, low Na, pulmonary and T2DM MNT    Protein (specify units) 90-100g    Fiber 25 grams    Whole Grain Foods 3 servings     Saturated Fats 14 max. grams    Fruits and Vegetables 8 servings/day    Sodium 2 grams      Personal Nutrition Goals   Nutrition Goal ST: include protein, fiber, and healthy fats when eating. Consider adding in nutrition shake such as ensure or boost. Add at least 2 meals/snacks LT: meet energy and protein needs, include at least 25g of fiber per day, limit Na <2g/day, limit saturated fat <14g/day, maintain A1C <7.    Comments 57 y.o. F admitted to pulmonary rehab for centrilobular emphysema. PMHx includes HTN, HLD, T2DM, arthritis, asthma, ataxia, COPD, depression, migraines, GERD, early satiety (2018). Relevant medications includes valium, nexium, prednisone, xanax, vyvanse, breo ellipta. Lauren Escobar does not have teeth or dentures at this time and reports that she is unable to get them as she went too long without them - she reports being able to eat most foods, reviewed considerations to help with eating and swallowing. Lauren Escobar reports having a very low appetite and  forces herself to eat 1 small meal (about a fistfull of food). She reports that she is unsure why, but her weight has been trending upward: discussed body compensatory mechanisms with low intake of food as well as fluid retention. Indiyah reports drinking coffee with french vanilla creamer and regular mountain dew during the day, she does not like diet soda and feels that water makes her bloat - discussed nutrition considerations with sugar sweetened beverages. Lauren Escobar reports that this is new to her and doesn't have much knowledge on nutrition - discussed heart healthy eating, pulmonary MNT, and T2DM MNT; discussed while MyPlate guidelines may be hard with her small portions, that she should include fiber, healthy fat, and protein when she eats and reviewed foods to include and consider in each group. Discussed mechanical eating (eating outside of hunger) by adding in at least 2 more meals/snacks as well as consider adding in a nutritional shake such  as ensure of boost at least 1x/day to help meet her needs while she includes more meals/snacks to help fill gaps in nutrition and to ensure she is meeting her nutritional needs. Lauren Escobar is open to making changes, but reports her biggest barrier will be the mechanical eating of foods - encouraged her that making changes can take time and it is about progress, not perfection.      Intervention Plan   Intervention Prescribe, educate and counsel regarding individualized specific dietary modifications aiming towards targeted core components such as weight, hypertension, lipid management, diabetes, heart failure and other comorbidities.;Nutrition handout(s) given to patient.    Expected Outcomes Short Term Goal: Understand basic principles of dietary content, such as calories, fat, sodium, cholesterol and nutrients.;Short Term Goal: A plan has been developed with personal nutrition goals set during dietitian appointment.;Long Term Goal: Adherence to prescribed nutrition plan.             Nutrition Assessments:  MEDIFICTS Score Key: ?70 Need to make dietary changes  40-70 Heart Healthy Diet ? 40 Therapeutic Level Cholesterol Diet  Flowsheet Row Pulmonary Rehab from 09/30/2022 [facility]  Picture Your Plate Total Score on Admission 55      Picture Your Plate Scores: [id number] Unhealthy dietary pattern with much room for improvement. 41-50 Dietary pattern unlikely to meet recommendations for good health and room for improvement. 51-60 More healthful dietary pattern, with some room for improvement.  >60 Healthy dietary pattern, although there may be some specific behaviors that could be improved.   Nutrition Goals Re-Evaluation:  Nutrition Goals Re-Evaluation     Calvert Name 11/01/22 0941 12/04/22 0941           Goals   Current Weight 162 lb (73.5 kg) --      Nutrition Goal Eat more foods. Short: Choose and plan snacks accordingly to patients caloric intake to improve  breathing. Long: Maintain a diet independently that meets their caloric intake to aid in daily shortness of breath.      Comment Sicilia states that her appetite is not good and sometimes goes days without eating.Patient was informed on why it is important to maintain a balanced diet when dealing with Respiratory issues. Explained that it takes a lot of energy to breath and when they are short of breath often they will need to have a good diet to help keep up with the calories they are expending for breathing. Lauren Escobar is doing well in rehab. She has no appetite.  Her boyfriend forces her to eat but it will  sometimes make her feel sick to her stomach.  We talked about the need to make sure she is doing mechanical eating to [facility] caloric need.  We also talked about tyring protein shakes or meal replacement shakes.      Expected Outcome Short: Choose and plan snacks accordingly to patients caloric intake to improve breathing. Long: Maintain a diet independently that meets their caloric intake to aid in daily shortness of breath. Short: Try mechanical eating/shakes Long: conitnue to eat regularly               Nutrition Goals Discharge (Final Nutrition Goals Re-Evaluation):  Nutrition Goals Re-Evaluation - 12/04/22 0941       Goals   Nutrition Goal Short: Choose and plan snacks accordingly to patients caloric intake to improve breathing. Long: Maintain a diet independently that meets their caloric intake to aid in daily shortness of breath.    Comment Lauren Escobar is doing well in rehab. She has no appetite.  Her boyfriend forces her to eat but it will sometimes make her feel sick to her stomach.  We talked about the need to make sure she is doing mechanical eating to [facility] caloric need.  We also talked about tyring protein shakes or meal replacement shakes.    Expected Outcome Short: Try mechanical eating/shakes Long: conitnue to eat regularly             Psychosocial: Target Goals: Acknowledge  presence or absence of significant depression and/or stress, maximize coping skills, provide positive support system. Participant is able to verbalize types and ability to use techniques and skills needed for reducing stress and depression.   Education: Stress, Anxiety, and Depression - Group verbal and visual presentation to define topics covered.  Reviews how body is impacted by stress, anxiety, and depression.  Also discusses healthy ways to reduce stress and to treat/manage anxiety and depression.  Written material given at graduation. Flowsheet Row Pulmonary Rehab from 12/25/2022 in [facility]  Date 10/30/22  Educator [facility]  Instruction Review Code 1- [employer] Understanding       Education: Sleep Hygiene -Provides group verbal and written instruction about how sleep can affect your health.  Define sleep hygiene, discuss sleep cycles and impact of sleep habits. Review good sleep hygiene tips.    Initial Review & Psychosocial Screening:  Initial Psych Review & Screening - 09/23/22 1027       Initial Review   Current issues with None Identified      Family Dynamics   Good Support System? Yes   daughter     Barriers   Psychosocial barriers to participate in program There are no identifiable barriers or psychosocial needs.      Screening Interventions   Interventions Encouraged to exercise;To provide support and resources with identified psychosocial needs;Provide feedback about the scores to participant    Expected Outcomes Short Term goal: Utilizing psychosocial counselor, staff and physician to assist with identification of specific Stressors or current issues interfering with healing process. Setting desired goal for each stressor or current issue identified.;Long Term Goal: Stressors or current issues are controlled or eliminated.;Short Term goal: Identification and review with participant of any Quality of Life or Depression concerns found by scoring the  questionnaire.;Long Term goal: The participant improves quality of Life and PHQ9 Scores as seen by post scores and/or verbalization of changes             Quality of Life Scores:  Scores of 19 and below usually  indicate a poorer quality of life in these areas.  A difference of  2-3 points is a clinically meaningful difference.  A difference of 2-3 points in the total score of the Quality of Life Index has been associated with significant improvement in overall quality of life, self-image, physical symptoms, and general health in studies assessing change in quality of life.  PHQ-9: Review Flowsheet  More data exists      12/30/2022 12/04/2022 11/01/2022 10/30/2022 09/30/2022  Depression screen PHQ 2/9  Decreased Interest 3 0 3 0 0  Down, Depressed, Hopeless 3 3 3  0 0  PHQ - 2 Score 6 3 6  0 0  Altered sleeping 3 3 3 3  0  Tired, decreased energy 3 3 3 3 3   Change in appetite 3 3 3 3 3   Feeling bad or failure about yourself  3 3 3 3 3   Trouble concentrating 3 0 3 3 3   Moving slowly or fidgety/restless 3 3 3  0 0  Suicidal thoughts 0 0 0 0 0  PHQ-9 Score 24 18 24 15 12   Difficult doing work/chores Very difficult Extremely dIfficult Extremely dIfficult Somewhat difficult Extremely dIfficult   Interpretation of Total Score  Total Score Depression Severity:  1-4 = Minimal depression, 5-9 = Mild depression, 10-14 = Moderate depression, 15-19 = Moderately severe depression, 20-27 = Severe depression   Psychosocial Evaluation and Intervention:  Psychosocial Evaluation - 09/23/22 1037       Psychosocial Evaluation & Interventions   Interventions Encouraged to exercise with the program and follow exercise prescription    Comments Lauren Escobar has no barriers to attending the program. She hopes to see improvement in her shortness of breath symptoms with exertion. She lives alone. Her daughter is her support person.  She has been diagnosed with diabetes and is diet controlled at this moment. She has  another A1C scheduled in Dec. She is ready to start the program and progress her exercise prescription, learn energy conservation and improve her stamina.    Expected Outcomes STG Lauren Escobar will attend all scheduled sessions, she will meet with our LDN to review safe ways to work on weight loss and help with diabetes control. She will begin an exercise progression.  LTG Lauren Escobar will continue to utilize the tips for exercise, breathing and risk factor control after discharge from the program.    Continue Psychosocial Services  Follow up required by staff             Psychosocial Re-Evaluation:  Psychosocial Re-Evaluation     Keddie Name 11/01/22 0935 12/04/22 0933 12/30/22 [id number]         Psychosocial Re-Evaluation   Current issues with Current Depression;Current Sleep Concerns;Current Anxiety/Panic;History of Depression;Current Stress Concerns Current Depression;Current Sleep Concerns;Current Anxiety/Panic;History of Depression;Current Stress Concerns Current Depression;Current Sleep Concerns;Current Anxiety/Panic;History of Depression;Current Stress Concerns     Comments Reviewed patient health questionnaire (PHQ-9) with patient for follow up. Previously, patients score indicated signs/symptoms of depression.  Reviewed to see if patient is improving symptom wise while in program.  Score declined and patient states that it is because she is stressedx with her health. Her stressers are her kids and she issues with her daughter. Her daughter has three children and has taken over her house. She is paying the bills and not staying at her own home. Informed her to seek help with a therapist or the doctor for medication for her mental well being. Lauren Escobar is doing well in rehab. Her PHQ score did improve some.  A lot of her issues seem to stem from her lack of sleep.  She is only able to sleep for about an hour before she wakes up.  She has done a sleep study in the past, but it did not go well and she never went  back for another.  We talked about how her lack of sleep is spilling out into the rest of life.  She was encouraged to contact her doctor about another sleep study.  She is working with her doctor to get in with a depression doctor.   She had one previously, but they lost their liscence.  She also has bladder disease and trying to find a good doctor.  Her mom had the same thing that turned into caner.  We looked up [facility] and gave her contact info. Lauren Escobar reports dealing with some anxiety mostly related to her health and family. Her PHQ score has increased from 18 to 24. She states that her sleep concerns are also related to her nerves and anxiety. She states that she was previously on anxiety medications but came off of that medication when the doctor who prescribed it quit practicing. We encouraged her to talk to her doctor about her lack of sleep and the possibility of going back on anxiety medication.     Expected Outcomes Short: talk to doctor about possible nmedication. Long: maintain a positive mental state. Short: Talk to doctor about sleep and bladder Long: Continue to exercise for mental boost Short: Talk to doctor about sleep and anxiety. Long: Continue to exercise for mental boost     Interventions Encouraged to attend Pulmonary Rehabilitation for the exercise;Stress management education;Relaxation education Encouraged to attend Pulmonary Rehabilitation for the exercise;Stress management education;Relaxation education Encouraged to attend Pulmonary Rehabilitation for the exercise;Stress management education;Relaxation education     Continue Psychosocial Services  Follow up required by staff Follow up required by staff Follow up required by staff       Initial Review   Source of Stress Concerns -- -- Chronic Illness;Family              Psychosocial Discharge (Final Psychosocial Re-Evaluation):  Psychosocial Re-Evaluation - 12/30/22 0928       Psychosocial Re-Evaluation    Current issues with Current Depression;Current Sleep Concerns;Current Anxiety/Panic;History of Depression;Current Stress Concerns    Comments Lauren Escobar reports dealing with some anxiety mostly related to her health and family. Her PHQ score has increased from 18 to 24. She states that her sleep concerns are also related to her nerves and anxiety. She states that she was previously on anxiety medications but came off of that medication when the doctor who prescribed it quit practicing. We encouraged her to talk to her doctor about her lack of sleep and the possibility of going back on anxiety medication.    Expected Outcomes Short: Talk to doctor about sleep and anxiety. Long: Continue to exercise for mental boost    Interventions Encouraged to attend Pulmonary Rehabilitation for the exercise;Stress management education;Relaxation education    Continue Psychosocial Services  Follow up required by staff      Initial Review   Source of Stress Concerns Chronic Illness;Family             Education: Education Goals: Education classes will be provided on a weekly basis, covering required topics. Participant will state understanding/return demonstration of topics presented.  Learning Barriers/Preferences:   General Pulmonary Education Topics:  Infection Prevention: - Provides verbal and written material to individual with  discussion of infection control including proper hand washing and proper equipment cleaning during exercise session. Flowsheet Row Pulmonary Rehab from 12/25/2022 in [facility] Cardiac and Pulmonary Rehab  Date 09/30/22  Educator [facility]  Instruction Review Code 1- Verbalizes Understanding       Falls Prevention: - Provides verbal and written material to individual with discussion of falls prevention and safety. Flowsheet Row Pulmonary Rehab from 12/25/2022 in [facility] Cardiac and Pulmonary Rehab  Date 09/23/22  Educator SB  Instruction Review Code 1- Verbalizes Understanding        Chronic Lung Disease Review: - Group verbal instruction with posters, models, PowerPoint presentations and videos,  to review new updates, new respiratory medications, new advancements in procedures and treatments. Providing information on websites and "800" numbers for continued self-education. Includes information about supplement oxygen, available portable oxygen systems, continuous and intermittent flow rates, oxygen safety, concentrators, and Medicare reimbursement for oxygen. Explanation of Pulmonary Drugs, including class, frequency, complications, importance of spacers, rinsing mouth after steroid MDI's, and proper cleaning methods for nebulizers. Review of basic lung anatomy and physiology related to function, structure, and complications of lung disease. Review of risk factors. Discussion about methods for diagnosing sleep apnea and types of masks and machines for OSA. Includes a review of the use of types of environmental controls: home humidity, furnaces, filters, dust mite/pet prevention, HEPA vacuums. Discussion about weather changes, air quality and the benefits of nasal washing. Instruction on Warning signs, infection symptoms, calling MD promptly, preventive modes, and value of vaccinations. Review of effective airway clearance, coughing and/or vibration techniques. Emphasizing that all should Create an Action Plan. Written material given at graduation. Flowsheet Row Pulmonary Rehab from 12/25/2022 in [facility] Cardiac and Pulmonary Rehab  Education need identified 09/30/22       AED/CPR: - Group verbal and written instruction with the use of models to demonstrate the basic use of the AED with the basic ABC's of resuscitation.    Anatomy and Cardiac Procedures: - Group verbal and visual presentation and models provide information about basic cardiac anatomy and function. Reviews the testing methods done to diagnose heart disease and the outcomes of the test results. Describes the  treatment choices: Medical Management, Angioplasty, or Coronary Bypass Surgery for treating various heart conditions including Myocardial Infarction, Angina, Valve Disease, and Cardiac Arrhythmias.  Written material given at graduation. Flowsheet Row Pulmonary Rehab from 12/25/2022 in [facility] Cardiac and Pulmonary Rehab  Date 12/04/22  Educator SB  Instruction Review Code 1- Verbalizes Understanding       Medication Safety: - Group verbal and visual instruction to review commonly prescribed medications for heart and lung disease. Reviews the medication, class of the drug, and side effects. Includes the steps to properly store meds and maintain the prescription regimen.  Written material given at graduation. Flowsheet Row Pulmonary Rehab from 12/25/2022 in [facility] Cardiac and Pulmonary Rehab  Date 10/09/22  Educator SB  Instruction Review Code 1- Verbalizes Understanding       Other: -Provides group and verbal instruction on various topics (see comments)   Knowledge Questionnaire Score:  Knowledge Questionnaire Score - 09/30/22 1107       Knowledge Questionnaire Score   Pre Score 16/18              Core Components/Risk Factors/Patient Goals at Admission:  Personal Goals and Risk Factors at Admission - 09/30/22 1107       Core Components/Risk Factors/Patient Goals on Admission    Weight Management Yes;Obesity;Weight Loss    Intervention  Weight Management: Develop a combined nutrition and exercise program designed to reach desired caloric intake, while maintaining appropriate intake of nutrient and fiber, sodium and fats, and appropriate energy expenditure required for the weight goal.;Weight Management: Provide education and appropriate resources to help participant work on and attain dietary goals.;Weight Management/Obesity: Establish reasonable short term and long term weight goals.;Obesity: Provide education and appropriate resources to help participant work on and attain dietary  goals.    Admit Weight 169 lb (76.7 kg)    Goal Weight: Short Term 165 lb (74.8 kg)    Goal Weight: Long Term 130 lb (59 kg)    Expected Outcomes Short Term: Continue to assess and modify interventions until short term weight is achieved;Long Term: Adherence to nutrition and physical activity/exercise program aimed toward attainment of established weight goal;Weight Loss: Understanding of general recommendations for a balanced deficit meal plan, which promotes 1-2 lb weight loss per week and includes a negative energy balance of [id number] kcal/d;Understanding recommendations for meals to include 15-35% energy as protein, 25-35% energy from fat, 35-60% energy from carbohydrates, less than 200mg  of dietary cholesterol, 20-35 gm of total fiber daily;Understanding of distribution of calorie intake throughout the day with the consumption of 4-5 meals/snacks    Improve shortness of breath with ADL's Yes    Intervention Provide education, individualized exercise plan and daily activity instruction to help decrease symptoms of SOB with activities of daily living.    Expected Outcomes Short Term: Improve cardiorespiratory fitness to achieve a reduction of symptoms when performing ADLs;Long Term: Be able to perform more ADLs without symptoms or delay the onset of symptoms    Increase knowledge of respiratory medications and ability to use respiratory devices properly  Yes    Intervention Provide education and demonstration as needed of appropriate use of medications, inhalers, and oxygen therapy.    Expected Outcomes Short Term: Achieves understanding of medications use. Understands that oxygen is a medication prescribed by physician. Demonstrates appropriate use of inhaler and oxygen therapy.;Long Term: Maintain appropriate use of medications, inhalers, and oxygen therapy.    Diabetes Yes    Intervention Provide education about signs/symptoms and action to take for hypo/hyperglycemia.;Provide education about  proper nutrition, including hydration, and aerobic/resistive exercise prescription along with prescribed medications to achieve blood glucose in normal ranges: Fasting glucose 65-99 mg/dL    Expected Outcomes Short Term: Participant verbalizes understanding of the signs/symptoms and immediate care of hyper/hypoglycemia, proper foot care and importance of medication, aerobic/resistive exercise and nutrition plan for blood glucose control.;Long Term: Attainment of HbA1C < 7%.    Hypertension Yes    Intervention Provide education on lifestyle modifcations including regular physical activity/exercise, weight management, moderate sodium restriction and increased consumption of fresh fruit, vegetables, and low fat dairy, alcohol moderation, and smoking cessation.;Monitor prescription use compliance.    Expected Outcomes Short Term: Continued assessment and intervention until BP is < 140/2mm HG in hypertensive participants. < 130/43mm HG in hypertensive participants with diabetes, heart failure or chronic kidney disease.;Long Term: Maintenance of blood pressure at goal levels.             Education:Diabetes - Individual verbal and written instruction to review signs/symptoms of diabetes, desired ranges of glucose level fasting, after meals and with exercise. Acknowledge that pre and post exercise glucose checks will be done for 3 sessions at entry of program. Flowsheet Row Pulmonary Rehab from 12/25/2022 in [facility] Cardiac and Pulmonary Rehab  Date 09/30/22  Educator [facility]  Instruction Review Code 1- [employer]  Understanding       Know Your Numbers and Heart Failure: - Group verbal and visual instruction to discuss disease risk factors for cardiac and pulmonary disease and treatment options.  Reviews associated critical values for Overweight/Obesity, Hypertension, Cholesterol, and Diabetes.  Discusses basics of heart failure: signs/symptoms and treatments.  Introduces Heart Failure Zone chart for action  plan for heart failure.  Written material given at graduation. Flowsheet Row Pulmonary Rehab from 12/25/2022 in [facility] Cardiac and Pulmonary Rehab  Date 12/25/22  Educator [facility]  Instruction Review Code 1- Verbalizes Understanding       Core Components/Risk Factors/Patient Goals Review:   Goals and Risk Factor Review     Row Name 11/01/22 [phone] 12/04/22 0944           Core Components/Risk Factors/Patient Goals Review   Personal Goals Review Improve shortness of breath with ADL's Improve shortness of breath with ADL's;Weight Management/Obesity;Increase knowledge of respiratory medications and ability to use respiratory devices properly.;Hypertension      Review Spoke to patient about their shortness of breath and what they can do to improve. Patient has been informed of breathing techniques when starting the program. Patient is informed to tell staff if they have had any med changes and that certain meds they are taking or not taking can be causing shortness of breath. Lauren Escobar is doing well in rehab.  Her weight is maintain despite not eating.  Since her mom passed, she gained weight but previously could breathe better so she would like to lose again.  She still is struggling with her breathing and it limits how much she is able to do each day.  She uses her inhaler daily and would like to get back to using a nubulizer, but need her doctor to order one.  Her pressures are good in class, but she does not check them at home.      Expected Outcomes Short: Attend [facility] regularly to improve shortness of breath with ADL's. Long: maintain independence with ADL's Short: Talk to doctor about nebulizers Long: COnitnue to work manage lung disease               Core Components/Risk Factors/Patient Goals at Discharge (Final Review):   Goals and Risk Factor Review - 12/04/22 0944       Core Components/Risk Factors/Patient Goals Review   Personal Goals Review Improve shortness of breath with  ADL's;Weight Management/Obesity;Increase knowledge of respiratory medications and ability to use respiratory devices properly.;Hypertension    Review Lauren Escobar is doing well in rehab.  Her weight is maintain despite not eating.  Since her mom passed, she gained weight but previously could breathe better so she would like to lose again.  She still is struggling with her breathing and it limits how much she is able to do each day.  She uses her inhaler daily and would like to get back to using a nubulizer, but need her doctor to order one.  Her pressures are good in class, but she does not check them at home.    Expected Outcomes Short: Talk to doctor about nebulizers Long: COnitnue to work manage lung disease             ITP Comments:  ITP Comments     Row Name 09/23/22 1048 09/30/22 1058 10/07/22 0955 10/23/22 1034 11/20/22 0956   ITP Comments Virtual orientation call completed today. shehas an appointment on Date: 09/30/2022  for EP eval and gym Orientation.  Documentation of diagnosis can be found  in [facility] Date: 09/11/2022 . Completed 6MWT and gym orientation. Initial ITP created and sent for review to Dr. Zetta Bills, Medical Director. First full day of exercise!  Patient was oriented to gym and equipment including functions, settings, policies, and procedures.  Patient's individual exercise prescription and treatment plan were reviewed.  All starting workloads were established based on the results of the 6 minute walk test done at initial orientation visit.  The plan for exercise progression was also introduced and progression will be customized based on patient's performance and goals. 30 Day review completed. Medical Director ITP review done, changes made as directed, and signed approval by Medical Director.     new to prgram 30 Day review completed. Medical Director ITP review done, changes made as directed, and signed approval by Medical Director.    [place] Name 12/18/22 1424 12/18/22 1707  01/07/23 1546 01/15/23 0914     ITP Comments 30 day review completed. ITP sent to Dr. Zetta Bills, Medical Director of  Pulmonary Rehab. Continue with ITP unless changes are made by physician. Called patient to check in on patient who has been feeling sick. Has not attended rehab since 2/9. She was told she had a flare up with bronchitis. She states she spoke with her doctor who told her to stay out of exercise until 2/28. She was given new inhalers. Patient will call us if she still does not feel good to come back next week. Called to check on patient.  She has been out with bronchitis.  However, she has also been having nose bleeds for the past four days.  She was seen by hematology last week and was encouraged to call them again to let them know about nose bleeds. We will take her out for the week to recover from bronchitis and she will let us know about next week.  Unable to assess for goals this term. 30 Day review completed. Medical Director ITP review done, changes made as directed, and signed approval by Medical Director.   remains out with medical concern             Comments:

## 2023-01-16 NOTE — Unmapped (Signed)
Hi,     Courtney Erickson has contacted the Communication [facility] in regards to the following symptom:     Bleeding: nose    Please contact [name] at [phone].    Check Indicates criteria has been reviewed and confirmed with the patient:    [x]  Preferred Name   [x]  DOB and/or MR#  [x]  Preferred Contact Method  [x]  Phone Number(s)   []  MyChart     A page or telephone call has been made to the corresponding clinic.     Thank you,  [name]   [facility]   [phone]

## 2023-01-16 NOTE — Unmapped (Signed)
AOC Triage Note     Patient: Courtney Erickson     Reason for call:  return call    Time call returned: 1533    Goal for this communication: bleeding nose     Phone Assessment: sore in nose, they have also come up in her ear. She describes them as not being blisters, more like sores.  She says the one in her nose bled off and on for several days.  She went to her PCP and was told it was some type of infection and was placed on sulfamethazole.  Her PCP told her she needed to let her oncologist know so they could make sure it wasn't due to the [facility].     Triage Recommendations: RN advised pt to continue taking the abx as prescribed.  RN will let the team know of the sores and someone will call her back to discuss more fully

## 2023-01-17 ENCOUNTER — Ambulatory Visit: Payer: 59

## 2023-01-17 NOTE — Unmapped (Signed)
called pt 01/17/2023 @11 :30am leave a vm. Pt has [employer] medicare advantage HMO. called to advise pt of estimate per 4/1 which is $90.68 if patient would like to keep this appointment , if she is unable to pay prior to on the day of her appointment will be canceled. Estimate created and finalized and mychart letter sent . add patient FYI flag.

## 2023-01-17 NOTE — Unmapped (Signed)
called pt 01/17/2023 @11 :09am unable to leave a vm. pt has dual complete plan, advsie patient to disregard vm that was left. she will not have any upfront fees to pa

## 2023-01-20 ENCOUNTER — Ambulatory Visit: Payer: 59 | Admitting: *Deleted

## 2023-01-22 ENCOUNTER — Ambulatory Visit: Payer: 59

## 2023-01-24 ENCOUNTER — Ambulatory Visit: Payer: 59 | Admitting: *Deleted

## 2023-01-27 ENCOUNTER — Ambulatory Visit: Payer: 59 | Admitting: *Deleted

## 2023-01-27 ENCOUNTER — Telehealth: Payer: Self-pay

## 2023-01-27 DIAGNOSIS — J432 Centrilobular emphysema: Secondary | ICD-10-CM

## 2023-01-27 DIAGNOSIS — R0602 Shortness of breath: Secondary | ICD-10-CM

## 2023-01-27 NOTE — Telephone Encounter (Signed)
Called patient to follow up on Pulmonary Rehab. Patient states she was told by her MD, Dr. Rebeca Alert to stop rehab at this time. Unsure for how long. Will reach out to Dr's office to clarify if patient needs medical hold or discharge at this time. Patient has not attended since 3/4 and 18 weeks up is 4/19.

## 2023-01-29 ENCOUNTER — Ambulatory Visit: Payer: 59

## 2023-01-30 ENCOUNTER — Ambulatory Visit: Admit: 2023-01-30 | Discharge: 2023-01-31 | Payer: MEDICARE

## 2023-01-30 DIAGNOSIS — M6289 Other specified disorders of muscle: Principal | ICD-10-CM

## 2023-01-30 DIAGNOSIS — N941 Unspecified dyspareunia: Principal | ICD-10-CM

## 2023-01-30 DIAGNOSIS — R102 Pelvic and perineal pain: Principal | ICD-10-CM

## 2023-01-30 DIAGNOSIS — N958 Other specified menopausal and perimenopausal disorders: Principal | ICD-10-CM

## 2023-01-30 MED ORDER — ESTRADIOL 0.01% (0.1 MG/GRAM) VAGINAL CREAM
VAGINAL | 1 refills | 64 days | Status: CP
Start: 2023-01-30 — End: 2023-09-01

## 2023-01-30 NOTE — Unmapped (Signed)
Provider: De Burrs, FNP-C   Division: Minimally Invasive Gynecologic Surgery    Date: Result date not found.   Patient Name: Courtney Erickson  MRN: [id number]  PCP: Audrie Gallus  Referring Provider: Gae Bon*      Assessment and Plan:     57 y.o. female G3P3000 with clinical history and exam findings consistent with:      ICD-10-CM   1. Genitourinary syndrome of menopause  N95.8   2. Dyspareunia in female  N94.10   3. High-tone pelvic floor dysfunction  M62.89   4. Pelvic pain in female  R10.2        No orders of the defined types were placed in this encounter.      Medications Prescribed Today               estradiol (ESTRACE) 0.01 % (0.1 mg/gram) vaginal cream Insert 2 g into the vagina daily for 14 days, THEN 1 g Two (2) times a week.          GSM  We reviewed that GSM has a strong negative impact on vaginal and urinary health and is associated with genital signs/symptoms dryness, burning, irritation, and sexual symptoms such as discomfort or pain, and impaired sexual function.    This condition, previously known as vulvovaginal atrophy (VVA), may also be accompanied by urinary signs and symptoms such as urinary incontinence, dysuria, sexual pain and frequent urinary tract infections. Unlike other menopausal symptoms, VVA is a chronic condition that tends to worsen throughout the years after menopause. It therefore requires prompt and long-term therapy to achieve good results and to avoid the recurrence of symptoms when treatment is stopped.    The first-line recommendations for the treatment of mild and moderate manifestations of GSM are nonhormonal vaginal lubricants that should be used before intercourse and vaginal moisturizers with a long-term effect that are used regularly (several times a week); in such cases, regular sexual activity is of importance.     We reviewed that lubricants with similar osmolality and pH to vaginal tissue without parabens are less irritating. Suggest options such as water based lubricants: YES!, Slippery Stuff, and Jo H20, Good Clean Love.     Vaginal moisturizers are insoluble hydrophilic cross-linked polymers with a characteristic bio-adhesiveness that is able to adhere to the epithelium of the vaginal wall by retaining water. The frequency of use is directly proportional to the severity of GSM.  Vaginal moisturizers provide this longer relief by changing the fluid content of the endothelium and lowering vaginal pH, thereby maintaining vaginal moisture and acidity.   - start vaginal moisturizers  - start vaginal estrogen       PFTM  The patient was counseled that pelvic pain may have many sources, and at times, pelvic floor muscle spasm can be a major contributor. The origin of pelvic floor muscle spasm can be multifactorial, including primary, reactive to a different pain source, trauma, or even part of a centralized pain syndrome.  With regard to pelvic floor muscle spasm, her clinical history and exam findings support this diagnosis.  Our primary intervention is typically pelvic floor physical therapy, and for some patients, the addition of local or orally administered muscle relaxants, or centrally acting pain medications (e.g. Gabapentin, Cymbalta) can be effective.   - PFPT as referred, consider another referral at [facility] if she has been unable to go  - consider TPI at follow up      The patient???s diagnosis, prognosis and treatment options  were discussed today. We also discussed the risks, benefits, alternatives, possible side effects, and instructions for all testing, procedures, and prescribed medications offered today. All questions were answered. Patient expressed agreement with the above treatment plan.     Treatment goals:  Treatment goals include complete resolution of pain with medication if necessary.    FUTURE CONSIDERATIONS:  - TPI  - Topicals/hyaluronic acid    Follow Up:  Return in about 3 months (around 05/01/2023).      I have personally reviewed the patient's medical record.     Subjective:     History of Present Illness:   Ms. Labor is a 57 y.o. female G3P3000 seen in consultation at the request of Gae Bon* for evaluation and recommendations regarding Her dyspareunia, high tone pelvic floor.     Date of onset: 1 year, dyspareunia worsened  Location: Vaginal/Pelvic   Intensity:        01/30/23 1334   PainSc: 8      Frequency: Constant  Modifying factors:  intercourse, stress, and walking  Associated symptoms: aching and burning    She does not drink any water, she drinks a coffee and a mountain dew everyday.     Comorbid Conditions: IC (previous interstim patient),     Current Treatments Previous Treatments   Current Relevant Medications:  Effexor  Vaginal valium 5 mg nightly  Fe  Mirtazapine  Ruxolitinib       Current Physical Therapy:   PT referral placed     Adjunct Treatments:   Activity Modification    In a Pain Clinic:   No Prior Relevant Medications:  Vaginal Estrace - last use 6 years ago, Dr Achilles Dunk was previous prescriber, she has not had any since he left.     Prior Physical Therapy: None.    Injections:  N/A    Prior Relevant Surgeries:   Hysterectomy 2004 - [facility]     Pelvic ROS  Bowels: Diarrhea  Bladder: IC  Sexual intercourse: Pain with superficial/light touch on the outside, Pain with deep penetration, and Pain with initial penetration.  Sleep: Hours of sleep per night 2 hours  Mood/Safety: Currently on medication for depression/anxiety and Does feel safe in current living situation    Current Exercise Regimen:  Not reviewed this visit     PAST GYNECOLOGIC HISTORY:     Last Pap: before 2004  reports h/o abnormal pap smear.  No colposcopy or LEEP history    Menopause: Unsure, Hysterectomy 2004  Hot flashes: Yes  Vaginal Dryness: No    Imaging/Labs:   None to review this visit       Obstetric and Gynecologic History History OB History   Gravida Para Term Preterm AB Living   3 3 3          SAB IAB Ectopic Molar Multiple Live Births                    # Outcome Date GA Lbr Len/2nd Weight Sex Delivery Anes PTL Lv   3 Term            2 Term            1 Term               Obstetric Comments   Last Pap: hyst - 2004   Abnormal prior to hyst   Ovaries remain        Medical History   She  has a past medical history  of Abnormal findings on esophagogastroduodenoscopy (EGD) (12/16/2013), Abnormal Pap smear of cervix, Adult ADHD, ANA positive, Anxiety, Asthma, Atony of bladder, Bipolar disorder (CMS-HCC), Chronic interstitial cystitis, Chronic pain syndrome, COPD (chronic obstructive pulmonary disease) (CMS-HCC), Depression, Diabetes (CMS-HCC), Difficult intravenous access, Dyspareunia, Endometriosis, Female stress incontinence, Gastric ulcer, GERD (gastroesophageal reflux disease), Gross hematuria, H/O colonoscopy (12/2011), Incomplete bladder emptying, Incontinence without sensory awareness, Insomnia, Other chronic cystitis, Other functional disorder of bladder, Pain medication agreement signed (08/19/2012), Polycythemia vera (CMS-HCC), Polycythemia vera(238.4), Scoliosis, Urge incontinence, Urinary frequency, and Von Willebrand's disease (CMS-HCC).     Surgical History   She  has a past surgical history that includes InterStim Device (2003); Total knee arthroplasty (Left, 2006); Hysterectomy (1999); Rotator cuff repair (Left); Appendectomy; pr ins/rplc perph sac/gstrc npg/rcvr pckt crtj&conn (Right, 02/06/2015); Breast biopsy (Right); Oophorectomy; pr revj/rmvl perph neurostimulator electrode array (N/A, 09/22/2017); and pr rev/rmv prph sac/gstrc npg/rcv dtch conn eltr ra (N/A, 09/22/2017).     Allergies   Lyrica [pregabalin], Trazodone, Gabapentin, and Tramadol   Medications     Current Outpatient Medications:     ADVAIR DISKUS 250-50 mcg/dose diskus, Inhale 1 puff  in the morning and 1 puff in the evening., Disp: , Rfl:     albuterol HFA 90 mcg/actuation inhaler, , Disp: , Rfl:     aspirin (ECOTRIN) 81 MG tablet, Take 1 tablet (81 mg total) by mouth daily., Disp: 150 tablet, Rfl: 2    diazePAM (VALIUM) 5 MG tablet, Place one tablet in the vagina every night for 6 weeks, then as needed for severe pain, Disp: 30 tablet, Rfl: 2    esomeprazole (NEXIUM) 40 MG capsule, Take 1 capsule (40 mg total) by mouth daily., Disp: , Rfl:     ferrous sulfate 325 (65 FE) MG tablet, Take 1 tablet (325 mg total) by mouth Every Tuesday, Thursday, Saturday, Sunday., Disp: 30 tablet, Rfl: 3    ipratropium-albuterol (DUO-NEB) 0.5-2.5 mg/3 mL nebulizer, Inhale 3 mL by nebulization every six (6) hours as needed., Disp: 360 mL, Rfl: 11    isosorbide mononitrate (ISMO,MONOKET) 10 MG tablet, Take 1 tablet (10 mg total) by mouth two (2) times a day., Disp: , Rfl:     mirtazapine (REMERON) 15 MG tablet, TAKE 1 TABLET BY MOUTH EVERY DAY AT NIGHT, Disp: 90 tablet, Rfl: 4    propranoloL (INDERAL) 40 MG tablet, Take 1 tablet (40 mg total) by mouth in the morning., Disp: , Rfl:     ruxolitinib (JAKAFI) 15 mg tablet, Take 1 tablet (15 mg total) by mouth Two (2) times a day., Disp: 60 tablet, Rfl: 11    SPIRIVA WITH HANDIHALER 18 mcg inhalation capsule, , Disp: , Rfl:     venlafaxine (EFFEXOR-XR) 150 MG 24 hr capsule, Take 1 capsule (150 mg total) by mouth daily. Then increase to 2 capsules by mouth daily after 2 weeks., Disp: 90 capsule, Rfl: 0    atorvastatin (LIPITOR) 20 MG tablet, Take 1 tablet (20 mg total) by mouth daily., Disp: 30 tablet, Rfl: 11    estradiol (ESTRACE) 0.01 % (0.1 mg/gram) vaginal cream, Insert 2 g into the vagina daily for 14 days, THEN 1 g Two (2) times a week., Disp: 42.5 g, Rfl: 1    Current Facility-Administered Medications:     lidocaine 2% gel (XYLOCAINE) jelly urojet 20 mL, 20 mL, Urethral, Once, Willis-Gray, Dutch Quint, MD       Family History Her family history includes Cancer (age of onset: 7) in her father; Cancer (age of onset:  70) in her mother; GU problems in her mother; Hyperlipidemia in her sister.     Social History She  reports that she quit smoking about 6 years ago. Her smoking use included cigarettes. She started smoking about 23 years ago. She has a 15 pack-year smoking history. She has never used smokeless tobacco. She reports that she does not drink alcohol and does not use drugs.        Occupational History    Not on file           Review of Systems: 10 point ROS completed, negative other than noted in HPI.    Objective:   The primary encounter diagnosis was Genitourinary syndrome of menopause. Diagnoses of Dyspareunia in female, High-tone pelvic floor dysfunction, and Pelvic pain in female were also pertinent to this visit.     PHYSICAL EXAM:    BP 150/87  - Pulse 75  - Ht 149.9 cm (4' 11)  - Wt 72.1 kg (159 lb)  - BMI 32.11 kg/m??          01/30/23 1334   PainSc: 8        Constitutional: Well-developed, well-nourished female in no acute distress  Neuro: Alert and oriented to person, place, and time.  Cardiovascular: No cyanosis, no evidence of edema.  Pulmonary: No work of breathing.   Psychiatric: Mood and affect appropriate.  Skin: No rashes or lesions. No lymphadenopathy.     Gastrointestinal: Soft, nontender, nondistended. No masses or hernias appreciated, no hepatosplenomegaly. No fluid wave. No rebound or guarding.    Genitourinary:      Vulva: Clitoris, prepuce normal, labia minora with partial agglutination. Allodynia present throughout vulva.      Urethra: Midline, no masses   Vagina:  Decreased rugation       Musculoskeletal:      Muscle Right Left Reproduces pain?   Levator ++ ++ yes   Obturator NT NT NT   Piriformis NT NT NT     Chaperone present: Esperanza Richters, RN      E&M Coding:    TIME ATTESTATION     Total Time for E/M Services on the Date of Encounter [phone] - I personally spent 40-54 minutes face-to-face and non-face-to-face in the care of this patient, excluding time spent during separately reported procedures, but including all pre, intra, and post visit time on the date of service. De Burrs, FNP-C   Family Nurse Practitioner - OB-GYN  Minimally Invasive Gynecologic Surgery    *Patient note was created using Dragon Dictation sotware. Errors in syntax or grammar may not have been identified and edited on initial review.

## 2023-01-30 NOTE — Unmapped (Signed)
Thank you for receiving care at [facility] ([facility]) ! If you have any questions or concerns you have two ways to contact our team:     1) Nurses line: [phone]  2) MyChart messages    Mychart messages may take up to 7 business days for a response. All mychart messages are received by our nursing team. Our nursing team will contact your provider if they are unable to fully answer your question or concern.   If you have had surgery with our team in the past 3 months, please call the office; do not mychart message. Your call will be triaged based on urgency and returned within 24 hours for urgent issues. Your call will be returned in up to 72 hours for non-urgent issues.  For scheduling appointments, please call the scheduling line ([phone]). Our first available appointment is typically 3-5 months out. Please plan accordingly. You may call weekly for cancellations as slots often become available.    3)Work/school leave documentation requests  You may reach Courtney Erickson at [phone] or via email at [email or web address].  If you have questions about your care, please direct them to the nurse contacts above    We will provide documentation for FMLA or short-term disability related directly to your post operative recovery.  Please allow 2 weeks (at least 10 business days) for completion of your forms.    We cannot provide documentation for the following:  Intermittent FMLA  Short-term disability outside of the post operative period  No long-term disability  No handicap placards  Time off prior to your surgical date    We can support you in making these requests to your PCP through our clinical documentation of your clinic visit, which you can find in your MyChart portal.       Other helpful numbers:  Surgical Scheduler: [phone]  Financial Counselor:[phone]  Fax: [phone]    Phone number for imaging scheduling:   Courtney Erickson: [phone]  Courtney Erickson: [phone]

## 2023-01-31 ENCOUNTER — Ambulatory Visit: Payer: 59

## 2023-02-03 ENCOUNTER — Ambulatory Visit: Payer: 59

## 2023-02-05 ENCOUNTER — Ambulatory Visit: Payer: 59

## 2023-02-07 ENCOUNTER — Ambulatory Visit: Payer: 59

## 2023-02-08 MED ORDER — VENLAFAXINE ER 150 MG CAPSULE,EXTENDED RELEASE 24 HR
ORAL_CAPSULE | 1 refills | 0 days
Start: 2023-02-08 — End: ?

## 2023-02-10 ENCOUNTER — Ambulatory Visit: Payer: 59

## 2023-02-10 MED ORDER — VENLAFAXINE ER 150 MG CAPSULE,EXTENDED RELEASE 24 HR
ORAL_CAPSULE | 1 refills | 0 days | Status: CP
Start: 2023-02-10 — End: ?

## 2023-02-10 NOTE — Unmapped (Signed)
Pt is requesting refill    Most recent clinic visit: 01/01/2023  Next clinic visit:  04/29/2023

## 2023-02-11 NOTE — Unmapped (Signed)
[facility] Refill Coordination Note    Specialty Medication(s) to be Shipped:   Hematology/Oncology: [name]    Other medication(s) to be shipped: No additional medications requested for fill at this time     Courtney Erickson, DOB: 1966/03/15  Phone: [phone] (home)       All above HIPAA information was verified with patient.     Was a Nurse, learning disability used for this call? No    Completed refill call assessment today to schedule patient's medication shipment from the [facility] [phone]).  All relevant notes have been reviewed.     Specialty medication(s) and dose(s) confirmed: Regimen is correct and unchanged.   Changes to medications: Courtney Erickson reports no changes at this time.  Changes to insurance: No  New side effects reported not previously addressed with a pharmacist or physician: None reported  Questions for the pharmacist: No    Confirmed patient received a Conservation officer, historic buildings and a Surveyor, mining with first shipment. The patient will receive a drug information handout for each medication shipped and additional FDA Medication Guides as required.       DISEASE/MEDICATION-SPECIFIC INFORMATION        N/A    SPECIALTY MEDICATION ADHERENCE     Medication Adherence    Patient reported X missed doses in the last month: 0  Specialty Medication: [name]  Patient is on additional specialty medications: No  Informant: patient     Were doses missed due to medication being on hold? No    Jakafi 15mg  : 12 days of medicine on hand       REFERRAL TO PHARMACIST     Referral to the pharmacist: Not needed      [facility]     Shipping address confirmed in Epic.     Delivery Scheduled: Yes, Expected medication delivery date: 02/19/23.     Medication will be delivered via Next Day Courier to the prescription address in Epic [place].    Courtney Erickson   [facility] Specialty Technician

## 2023-02-12 ENCOUNTER — Encounter: Payer: Self-pay | Admitting: *Deleted

## 2023-02-12 ENCOUNTER — Ambulatory Visit: Payer: 59

## 2023-02-12 DIAGNOSIS — J432 Centrilobular emphysema: Secondary | ICD-10-CM

## 2023-02-12 DIAGNOSIS — R0602 Shortness of breath: Secondary | ICD-10-CM

## 2023-02-12 NOTE — Progress Notes (Signed)
Pulmonary Individual Treatment Plan  Patient Details  Name: Lauren Escobar MRN: [id number] Date of Birth: 1966/03/21 Referring Provider:   Flowsheet Row Pulmonary Rehab from 09/30/2022 in [facility] Cardiac and Pulmonary Rehab  Referring Provider Raechel Chute MD       Initial Encounter Date:  Flowsheet Row Pulmonary Rehab from 09/30/2022 in [facility] Cardiac and Pulmonary Rehab  Date 09/30/22       Visit Diagnosis: Centrilobular emphysema  Shortness of breath  Patient's Home Medications on Admission:  Current Outpatient Medications:    albuterol (VENTOLIN HFA) 108 (90 Base) MCG/ACT inhaler, Inhale into the lungs., Disp: , Rfl:    aspirin EC 81 MG tablet, Take by mouth., Disp: , Rfl:    atorvastatin (LIPITOR) 20 MG tablet, TAKE 1 TABLET BY MOUTH AT BEDTIME FOR HIGH CHOLESTEROL, Disp: , Rfl:    cetirizine (ZYRTEC) 10 MG tablet, TAKE 1 TABLET BY MOUTH DAILY FOR ALLERGIES, Disp: , Rfl:    dextromethorphan-guaiFENesin (MUCINEX DM) 30-600 MG 12hr tablet, Take 1 tablet by mouth 2 (two) times daily as needed for cough., Disp: 14 tablet, Rfl: 0   esomeprazole (NEXIUM) 40 MG capsule, Take 40 mg by mouth daily. , Disp: , Rfl:    ferrous sulfate 325 (65 FE) MG tablet, Take by mouth., Disp: , Rfl:    fluticasone furoate-vilanterol (BREO ELLIPTA) 100-25 MCG/ACT AEPB, Inhale 1 puff into the lungs daily., Disp: 30 each, Rfl: 11   folic acid (FOLVITE) 1 MG tablet, Take 1 mg by mouth daily., Disp: , Rfl:    isosorbide mononitrate (IMDUR) 30 MG 24 hr tablet, Take 30 mg by mouth daily., Disp: , Rfl:    Multiple Vitamins-Minerals (MULTIVIT/MULTIMINERAL ADULT PO), Take by mouth., Disp: , Rfl:    nicotine (NICODERM CQ - DOSED IN MG/24 HOURS) 21 mg/24hr patch, Place 1 patch (21 mg total) onto the skin daily., Disp: 28 patch, Rfl: 0   NITROGLYCERIN SL, Place 1 tablet under the tongue. (Patient not taking: Reported on 09/11/2022), Disp: , Rfl:    oseltamivir (TAMIFLU) 75 MG capsule, Take 1 capsule (75 mg total)  by mouth 2 (two) times daily., Disp: 5 capsule, Rfl: 0   promethazine (PHENERGAN) 12.5 MG tablet, Take 1 tablet (12.5 mg total) by mouth every 6 (six) hours as needed for nausea or vomiting., Disp: 20 tablet, Rfl: 0   propranolol (INDERAL) 40 MG tablet, TAKE 1 TABLET TWICE DAILY TO PREVENT HEADACHES, Disp: , Rfl:    REXULTI 0.5 MG TABS, TAKE 1 TABLET BY MOUTH AT BEDTIME FOR 1 WEEK THEN INCREASE TO 1 MG TABLET, Disp: 67 tablet, Rfl: 1   risperiDONE (RISPERDAL) 1 MG tablet, Take 0.5 tablets (0.5 mg total) by mouth at bedtime for 7 days, THEN 1 tablet (1 mg total) at bedtime., Disp: 33.5 tablet, Rfl: 0   venlafaxine XR (EFFEXOR-XR) 150 MG 24 hr capsule, Take 2 capsules (300 mg total) by mouth every morning., Disp: 30 capsule, Rfl: 3   VRAYLAR 1.5 MG capsule, Take 1 capsule (1.5 mg total) by mouth daily., Disp: 30 capsule, Rfl: 3  Past Medical History: Past Medical History:  Diagnosis Date   Abdominal pain    Abnormal mammogram    Anxiety    Asthma    Bleeding disorder (HCC)    Blood dyscrasia    polycythemia vera   Chronic kidney disease    COPD (chronic obstructive pulmonary disease) (HCC)    Cough    Depression    Diabetes mellitus without complication (HCC)    Fatigue  Headache 2000   Horizontal nystagmus    age 52   Hyperglycemia    Hypertension    Low back pain    Major depressive disorder    Nasal lesion    Neck pain    Snoring    SOB (shortness of breath)    Tobacco use    UTI (urinary tract infection)    Von Willebrand's disease (HCC)     Tobacco Use: Social History   Tobacco Use  Smoking Status Former   Packs/day: 2.75   Years: 29.00   Additional pack years: 0.00   Total pack years: 79.75   Types: Cigarettes   Quit date: 10/2020   Years since quitting: 2.2  Smokeless Tobacco Never    Labs: Review Flowsheet       Latest Ref Rng & Units 04/06/2013 01/01/2014 06/16/2017 10/15/2022  Labs for ITP Cardiac and Pulmonary Rehab  Cholestrol 0 - 200 mg/dL 409   811  - 914   LDL (calc) 0 - 99 mg/dL 34  40  - 92   HDL-C >78 mg/dL 32  34  - 49   Trlycerides <150 mg/dL 295  621  - 308   Hemoglobin A1c 4.8 - 5.6 % 5.2  5.5  5.5  6.1   Bicarbonate 20.0 - 28.0 mmol/L - - - 23.4   Acid-base deficit 0.0 - 2.0 mmol/L - - - 0.7   O2 Saturation % - - - 84.1      Pulmonary Assessment Scores:  Pulmonary Assessment Scores     Row Name 09/30/22 1109         ADL UCSD   ADL Phase Entry     SOB Score total 62     Rest 1     Walk 2     Stairs 5     Bath 4     Dress 2     Shop 3       CAT Score   CAT Score 20       mMRC Score   mMRC Score 3              UCSD: Self-administered rating of dyspnea associated with activities of daily living (ADLs) 6-point scale (0 = "not at all" to 5 = "maximal or unable to do because of breathlessness")  Scoring Scores range from 0 to 120.  Minimally important difference is 5 units  CAT: CAT can identify the health impairment of COPD patients and is better correlated with disease progression.  CAT has a scoring range of zero to 40. The CAT score is classified into four groups of low (less than 10), medium (10 - 20), high (21-30) and very high (31-40) based on the impact level of disease on health status. A CAT score over 10 suggests significant symptoms.  A worsening CAT score could be explained by an exacerbation, poor medication adherence, poor inhaler technique, or progression of COPD or comorbid conditions.  CAT MCID is 2 points  mMRC: mMRC (Modified Medical Research Council) Dyspnea Scale is used to assess the degree of baseline functional disability in patients of respiratory disease due to dyspnea. No minimal important difference is established. A decrease in score of 1 point or greater is considered a positive change.   Pulmonary Function Assessment:  Pulmonary Function Assessment - 09/30/22 1109       Breath   Shortness of Breath Yes;Fear of Shortness of Breath;Limiting activity;Panic with  Shortness of Breath  Exercise Target Goals: Exercise Program Goal: Individual exercise prescription set using results from initial 6 min walk test and THRR while considering  patient's activity barriers and safety.   Exercise Prescription Goal: Initial exercise prescription builds to 30-45 minutes a day of aerobic activity, 2-3 days per week.  Home exercise guidelines will be given to patient during program as part of exercise prescription that the participant will acknowledge.  Education: Aerobic Exercise: - Group verbal and visual presentation on the components of exercise prescription. Introduces F.I.T.T principle from ACSM for exercise prescriptions.  Reviews F.I.T.T. principles of aerobic exercise including progression. Written material given at graduation.   Education: Resistance Exercise: - Group verbal and visual presentation on the components of exercise prescription. Introduces F.I.T.T principle from ACSM for exercise prescriptions  Reviews F.I.T.T. principles of resistance exercise including progression. Written material given at graduation. Flowsheet Row Pulmonary Rehab from 12/25/2022 in [facility] Cardiac and Pulmonary Rehab  Date 11/20/22  Educator [facility]  Instruction Review Code 1- Verbalizes Understanding        Education: Exercise & Equipment Safety: - Individual verbal instruction and demonstration of equipment use and safety with use of the equipment. Flowsheet Row Pulmonary Rehab from 12/25/2022 in [facility] Cardiac and Pulmonary Rehab  Date 09/30/22  Educator [facility]  Instruction Review Code 1- Verbalizes Understanding       Education: Exercise Physiology & General Exercise Guidelines: - Group verbal and written instruction with models to review the exercise physiology of the cardiovascular system and associated critical values. Provides general exercise guidelines with specific guidelines to those with heart or lung disease.    Education: Flexibility, Balance,  Mind/Body Relaxation: - Group verbal and visual presentation with interactive activity on the components of exercise prescription. Introduces F.I.T.T principle from ACSM for exercise prescriptions. Reviews F.I.T.T. principles of flexibility and balance exercise training including progression. Also discusses the mind body connection.  Reviews various relaxation techniques to help reduce and manage stress (i.e. Deep breathing, progressive muscle relaxation, and visualization). Balance handout provided to take home. Written material given at graduation. Flowsheet Row Pulmonary Rehab from 12/25/2022 in [facility] Cardiac and Pulmonary Rehab  Date 11/20/22  Educator [facility]  Instruction Review Code 1- Verbalizes Understanding       Activity Barriers & Risk Stratification:  Activity Barriers & Cardiac Risk Stratification - 09/30/22 1100       Activity Barriers & Cardiac Risk Stratification   Activity Barriers Left Knee Replacement;Shortness of Breath;Balance Concerns;History of Falls;Muscular Weakness;Deconditioning;Back Problems   occasional back pain            6 Minute Walk:  6 Minute Walk     Row Name 09/30/22 1058         6 Minute Walk   Phase Initial     Distance 1315 feet     Walk Time 6 minutes     # of Rest Breaks 0     MPH 2.49     METS 3.55     RPE 15     Perceived Dyspnea  3     VO2 Peak 12.42     Symptoms Yes (comment)     Comments SOB, Fatigue     Resting HR 83 bpm     Resting BP 124/66     Resting Oxygen Saturation  96 %     Exercise Oxygen Saturation  during 6 min walk 95 %     Max Ex. HR 106 bpm     Max Ex. BP 146/74  2 Minute Post BP 136/72       Interval HR   1 Minute HR 104     2 Minute HR 104     3 Minute HR 103     4 Minute HR 106     5 Minute HR 101     6 Minute HR 101     2 Minute Post HR 82     Interval Heart Rate? Yes       Interval Oxygen   Interval Oxygen? Yes     Baseline Oxygen Saturation % 96 %     1 Minute Oxygen Saturation % 95 %     1  Minute Liters of Oxygen 0 L  Room Air     2 Minute Oxygen Saturation % 96 %     2 Minute Liters of Oxygen 0 L     3 Minute Oxygen Saturation % 96 %     3 Minute Liters of Oxygen 0 L     4 Minute Oxygen Saturation % 96 %     4 Minute Liters of Oxygen 0 L     5 Minute Oxygen Saturation % 95 %     5 Minute Liters of Oxygen 0 L     6 Minute Oxygen Saturation % 96 %     6 Minute Liters of Oxygen 0 L     2 Minute Post Oxygen Saturation % 96 %     2 Minute Post Liters of Oxygen 0 L             Oxygen Initial Assessment:  Oxygen Initial Assessment - 09/30/22 1109       Home Oxygen   Home Oxygen Device None    Sleep Oxygen Prescription None    Home Exercise Oxygen Prescription None    Home Resting Oxygen Prescription None      Initial 6 min Walk   Oxygen Used None      Program Oxygen Prescription   Program Oxygen Prescription None      Intervention   Short Term Goals To learn and demonstrate proper pursed lip breathing techniques or other breathing techniques. ;To learn and understand importance of maintaining oxygen saturations>88%;To learn and demonstrate proper use of respiratory medications;To learn and understand importance of monitoring SPO2 with pulse oximeter and demonstrate accurate use of the pulse oximeter.    Long  Term Goals Maintenance of O2 saturations>88%;Exhibits proper breathing techniques, such as pursed lip breathing or other method taught during program session;Compliance with respiratory medication;Demonstrates proper use of MDI's;Verbalizes importance of monitoring SPO2 with pulse oximeter and return demonstration             Oxygen Re-Evaluation:  Oxygen Re-Evaluation     Row Name 10/07/22 0956 11/01/22 0943 12/04/22 0947         Program Oxygen Prescription   Program Oxygen Prescription -- None None       Home Oxygen   Home Oxygen Device -- None None     Sleep Oxygen Prescription -- None None     Home Exercise Oxygen Prescription -- None None      Home Resting Oxygen Prescription -- None None       Goals/Expected Outcomes   Short Term Goals -- To learn and understand importance of maintaining oxygen saturations>88%;To learn and understand importance of monitoring SPO2 with pulse oximeter and demonstrate accurate use of the pulse oximeter. To learn and understand importance of maintaining oxygen saturations>88%;To learn and understand importance of  monitoring SPO2 with pulse oximeter and demonstrate accurate use of the pulse oximeter.;To learn and demonstrate proper pursed lip breathing techniques or other breathing techniques. ;To learn and demonstrate proper use of respiratory medications     Long  Term Goals -- Maintenance of O2 saturations>88%;Verbalizes importance of monitoring SPO2 with pulse oximeter and return demonstration Verbalizes importance of monitoring SPO2 with pulse oximeter and return demonstration;Maintenance of O2 saturations>88%;Exhibits proper breathing techniques, such as pursed lip breathing or other method taught during program session;Compliance with respiratory medication;Demonstrates proper use of MDI's     Comments Reviewed PLB technique with pt.  Talked about how it works and it's importance in maintaining their exercise saturations. She does not have a pulse oximeter to check her oxygen saturation at home. Informed her where to get one and explained why it is important to have one. Reviewed that oxygen saturations should be 88 percent and above. Patient verbalizes understanding. Lauren Escobar is doing well in rehab.  She has good days and bad days with her breathing.  It is still one of her biggest limitations along with her lack of sleep.  She  does not currently have a pulse oximeter at home, but will get one from her [facility].  She has been good about using her inhaler but would like to talk with her pulmonologist about getting a nebulizer for home again.     Goals/Expected Outcomes Short: Become more profiecient  at using PLB. Long: Become independent at using PLB. Short: monitor oxygen at home with exertion. Long: maintain oxygen saturations above 88 percent independently. Short; Talk to doctor about nebulizer Long: continue to manage lung disease              Oxygen Discharge (Final Oxygen Re-Evaluation):  Oxygen Re-Evaluation - 12/04/22 0947       Program Oxygen Prescription   Program Oxygen Prescription None      Home Oxygen   Home Oxygen Device None    Sleep Oxygen Prescription None    Home Exercise Oxygen Prescription None    Home Resting Oxygen Prescription None      Goals/Expected Outcomes   Short Term Goals To learn and understand importance of maintaining oxygen saturations>88%;To learn and understand importance of monitoring SPO2 with pulse oximeter and demonstrate accurate use of the pulse oximeter.;To learn and demonstrate proper pursed lip breathing techniques or other breathing techniques. ;To learn and demonstrate proper use of respiratory medications    Long  Term Goals Verbalizes importance of monitoring SPO2 with pulse oximeter and return demonstration;Maintenance of O2 saturations>88%;Exhibits proper breathing techniques, such as pursed lip breathing or other method taught during program session;Compliance with respiratory medication;Demonstrates proper use of MDI's    Comments Lauren Escobar is doing well in rehab.  She has good days and bad days with her breathing.  It is still one of her biggest limitations along with her lack of sleep.  She  does not currently have a pulse oximeter at home, but will get one from her [facility].  She has been good about using her inhaler but would like to talk with her pulmonologist about getting a nebulizer for home again.    Goals/Expected Outcomes Short; Talk to doctor about nebulizer Long: continue to manage lung disease             Initial Exercise Prescription:  Initial Exercise Prescription - 09/30/22 1100       Date of  Initial Exercise RX and Referring Provider   Date 09/30/22  Referring Provider Armando Reichert MD      NuStep   Level 3    SPM 80    Minutes 15    METs 2      Arm Ergometer   Level 1    RPM 25    Minutes 15    METs 2      Biostep-RELP   Level 2    SPM 50    Minutes 15    METs 2      Track   Laps 34    Minutes 15    METs 2.85      Prescription Details   Frequency (times per week) 3    Duration Progress to 30 minutes of continuous aerobic without signs/symptoms of physical distress      Intensity   THRR 40-80% of Max Heartrate 115-147    Ratings of Perceived Exertion 11-13    Perceived Dyspnea 0-4      Progression   Progression Continue to progress workloads to maintain intensity without signs/symptoms of physical distress.      Resistance Training   Training Prescription Yes    Weight 3 lb    Reps 10-15             Perform Capillary Blood Glucose checks as needed.  Exercise Prescription Changes:   Exercise Prescription Changes     Row Name 09/30/22 1100 10/22/22 1400 11/04/22 1100 11/18/22 1500 12/02/22 1200     Response to Exercise   Blood Pressure (Admit) 124/66 116/70 128/64 110/60 108/60   Blood Pressure (Exercise) 146/74 134/72 134/70 128/66 --   Blood Pressure (Exit) 126/62 114/66 126/64 102/62 128/72   Heart Rate (Admit) 83 bpm 84 bpm 87 bpm 87 bpm 82 bpm   Heart Rate (Exercise) 106 bpm 102 bpm 100 bpm 98 bpm 127 bpm   Heart Rate (Exit) 77 bpm 93 bpm 82 bpm 85 bpm 94 bpm   Oxygen Saturation (Admit) 96 % 99 % 98 % 97 % 98 %   Oxygen Saturation (Exercise) 95 % 95 % 93 % 97 % 95 %   Oxygen Saturation (Exit) 98 % 98 % 98 % 99 % 98 %   Rating of Perceived Exertion (Exercise) 15 15 15 14 13    Perceived Dyspnea (Exercise) 3 3 3 2 1    Symptoms SOB, fatigue SOB SOB SOB none   Comments walk test results First 2 weeks of exercise -- -- --   Duration -- Continue with 30 min of aerobic exercise without signs/symptoms of physical distress. Continue  with 30 min of aerobic exercise without signs/symptoms of physical distress. Continue with 30 min of aerobic exercise without signs/symptoms of physical distress. Continue with 30 min of aerobic exercise without signs/symptoms of physical distress.   Intensity -- THRR unchanged THRR unchanged THRR unchanged THRR unchanged     Progression   Progression -- Continue to progress workloads to maintain intensity without signs/symptoms of physical distress. Continue to progress workloads to maintain intensity without signs/symptoms of physical distress. Continue to progress workloads to maintain intensity without signs/symptoms of physical distress. Continue to progress workloads to maintain intensity without signs/symptoms of physical distress.   Average METs -- 2 1.9 2.33 2.31     Resistance Training   Training Prescription -- Yes Yes Yes Yes   Weight -- 3 lb 3 lb 3 lb 3 lb   Reps -- 10-15 10-15 10-15 10-15     Interval Training   Interval Training -- No No No No  NuStep   Level -- 3 -- 4 --   Minutes -- 15 -- 15 --   METs -- 2 -- -- --     Arm Ergometer   Level -- 1 -- 2.1 1.7   Minutes -- 15 -- 15 15   METs -- 2 -- 2 2     T5 Nustep   Level -- -- 3 3 3    Minutes -- -- 30 30 30    METs -- -- 1.7 -- 2     Biostep-RELP   Level -- 3 1 -- 4   Minutes -- 15 15 -- 30   METs -- 2 2 -- 3     Track   Laps -- 32 -- 28 --   Minutes -- 15 -- 15 --   METs -- 2.74 -- 2.52 --     Oxygen   Maintain Oxygen Saturation -- 88% or higher 88% or higher 88% or higher 88% or higher    Row Name 12/04/22 0900 12/16/22 1400 12/30/22 1300         Response to Exercise   Blood Pressure (Admit) -- 104/64 116/60     Blood Pressure (Exit) -- 108/60 110/64     Heart Rate (Admit) -- 81 bpm 88 bpm     Heart Rate (Exercise) -- 96 bpm 111 bpm     Heart Rate (Exit) -- 82 bpm 95 bpm     Oxygen Saturation (Admit) -- 98 % 98 %     Oxygen Saturation (Exercise) -- 90 % 95 %     Oxygen Saturation (Exit) --  96 % 98 %     Rating of Perceived Exertion (Exercise) -- 13 15     Perceived Dyspnea (Exercise) -- 3 2     Symptoms -- SOB SOB     Comments -- -- return back from 2/9     Duration -- Continue with 30 min of aerobic exercise without signs/symptoms of physical distress. Continue with 30 min of aerobic exercise without signs/symptoms of physical distress.     Intensity -- THRR unchanged THRR unchanged       Progression   Progression -- Continue to progress workloads to maintain intensity without signs/symptoms of physical distress. Continue to progress workloads to maintain intensity without signs/symptoms of physical distress.     Average METs -- 2.12 2.04       Resistance Training   Training Prescription -- Yes Yes     Weight -- 3 lb 3 lb     Reps -- 10-15 10-15       Interval Training   Interval Training -- No No       NuStep   Level -- 3 3     Minutes -- 30 30     METs -- 2.4 2.3       Arm Ergometer   Level -- 1 1     Minutes -- 15 15     METs -- 1.9 2       Track   Laps -- 22 15     Minutes -- 15 15     METs -- 2.2 1.82       Home Exercise Plan   Plans to continue exercise at Home (comment)  walking, staff videos Home (comment)  walking, staff videos Home (comment)  walking, staff videos     Frequency Add 2 additional days to program exercise sessions. Add 2 additional days to program exercise sessions. Add 2 additional days to  program exercise sessions.     Initial Home Exercises Provided 12/04/22 12/04/22 12/04/22       Oxygen   Maintain Oxygen Saturation -- 88% or higher 88% or higher              Exercise Comments:   Exercise Comments     Row Name 10/07/22 0955           Exercise Comments First full day of exercise!  Patient was oriented to gym and equipment including functions, settings, policies, and procedures.  Patient's individual exercise prescription and treatment plan were reviewed.  All starting workloads were established based on the results  of the 6 minute walk test done at initial orientation visit.  The plan for exercise progression was also introduced and progression will be customized based on patient's performance and goals.                Exercise Goals and Review:   Exercise Goals     Row Name 09/30/22 1102             Exercise Goals   Increase Physical Activity Yes       Intervention Provide advice, education, support and counseling about physical activity/exercise needs.;Develop an individualized exercise prescription for aerobic and resistive training based on initial evaluation findings, risk stratification, comorbidities and participant's personal goals.       Expected Outcomes Long Term: Exercising regularly at least 3-5 days a week.;Long Term: Add in home exercise to make exercise part of routine and to increase amount of physical activity.;Short Term: Attend rehab on a regular basis to increase amount of physical activity.       Increase Strength and Stamina Yes       Intervention Provide advice, education, support and counseling about physical activity/exercise needs.;Develop an individualized exercise prescription for aerobic and resistive training based on initial evaluation findings, risk stratification, comorbidities and participant's personal goals.       Expected Outcomes Short Term: Increase workloads from initial exercise prescription for resistance, speed, and METs.;Short Term: Perform resistance training exercises routinely during rehab and add in resistance training at home;Long Term: Improve cardiorespiratory fitness, muscular endurance and strength as measured by increased METs and functional capacity (6MWT)       Able to understand and use rate of perceived exertion (RPE) scale Yes       Intervention Provide education and explanation on how to use RPE scale       Expected Outcomes Short Term: Able to use RPE daily in rehab to express subjective intensity level;Long Term:  Able to use RPE to guide  intensity level when exercising independently       Able to understand and use Dyspnea scale Yes       Intervention Provide education and explanation on how to use Dyspnea scale       Expected Outcomes Short Term: Able to use Dyspnea scale daily in rehab to express subjective sense of shortness of breath during exertion;Long Term: Able to use Dyspnea scale to guide intensity level when exercising independently       Knowledge and understanding of Target Heart Rate Range (THRR) Yes       Intervention Provide education and explanation of THRR including how the numbers were predicted and where they are located for reference       Expected Outcomes Short Term: Able to state/look up THRR;Short Term: Able to use daily as guideline for intensity in rehab;Long Term: Able to use THRR to  govern intensity when exercising independently       Able to check pulse independently Yes       Intervention Provide education and demonstration on how to check pulse in carotid and radial arteries.;Review the importance of being able to check your own pulse for safety during independent exercise       Expected Outcomes Short Term: Able to explain why pulse checking is important during independent exercise;Long Term: Able to check pulse independently and accurately       Understanding of Exercise Prescription Yes       Intervention Provide education, explanation, and written materials on patient's individual exercise prescription       Expected Outcomes Short Term: Able to explain program exercise prescription;Long Term: Able to explain home exercise prescription to exercise independently                Exercise Goals Re-Evaluation :  Exercise Goals Re-Evaluation     Row Name 10/07/22 0955 10/22/22 1426 11/04/22 1152 11/18/22 1525 12/02/22 1203     Exercise Goal Re-Evaluation   Exercise Goals Review Able to understand and use rate of perceived exertion (RPE) scale;Able to understand and use Dyspnea scale;Knowledge  and understanding of Target Heart Rate Range (THRR);Understanding of Exercise Prescription Increase Physical Activity;Increase Strength and Stamina;Understanding of Exercise Prescription Increase Physical Activity;Increase Strength and Stamina;Understanding of Exercise Prescription Increase Physical Activity;Increase Strength and Stamina;Understanding of Exercise Prescription Increase Physical Activity;Increase Strength and Stamina;Understanding of Exercise Prescription   Comments Reviewed RPE scale, THR and program prescription with pt today.  Pt voiced understanding and was given a copy of goals to take home. Diyana is off to a good start in rehab. She was able to work at an average MET level of 2 METs during her first two weeks in rehab. She also was able to walk up to 32 laps on the track. She was able to follow her initial exercise prescription on the seated machines as well. We will continue to monitor her progress in the program. Adalei continues to do well in rehab, however, she missed a few sessions as she was in the hospital for the flu. She started to ease back into her exercise and tried out the T5 at level 3. She also went down on her Biostep level but will remind patient to increase it gradually over time. She is not quite hitting her THR but RPEs are appropriate and dyspneas but reaching up to a 3. We will continue to monitor as she starts to feel better. Birkley is doing well in rehab. She recently increased her overall MET level to 2.33 METs. She also improved to level 4 on the T4 and level 2.1 on the arm crank. She did walk a few less laps since the last review going from 32 laps to 28 laps. We will continue to monitor her progress in the program. Lauren Escobar is doing well in rehab. she has not walked this last review and has remained on seated machines including the arm ergometer. She increased to level 4 on the Biostep and completed that for the full 30 minutes. Her dyspnea was no less than a 1, when  before is averaged 2-3! Her oxygen saturations are staying well above 88%. Will continue to monitor.   Expected Outcomes Short: Use RPE daily to regulate intensity. Long: Follow program prescription in THR. Short: Continue to follow current exercise prescription. Long: Continue to improve strength and stamina. Short: Increase Biostep level, ease back into walking Long: Continue to  increase overall MEt level Short: Push for more laps on the track. Long: Continue to improve strength and stamina. Short: Encourage more walking into exercise sessions Long: Continue to increase overall MET level and stamina    Row Name 12/04/22 0930 12/16/22 1431 12/30/22 1330 01/13/23 1451 01/27/23 1157     Exercise Goal Re-Evaluation   Exercise Goals Review Increase Physical Activity;Increase Strength and Stamina;Understanding of Exercise Prescription;Able to understand and use rate of perceived exertion (RPE) scale;Knowledge and understanding of Target Heart Rate Range (THRR);Able to understand and use Dyspnea scale;Able to check pulse independently Increase Physical Activity;Increase Strength and Stamina;Understanding of Exercise Prescription Increase Physical Activity;Increase Strength and Stamina;Understanding of Exercise Prescription Increase Physical Activity;Increase Strength and Stamina;Understanding of Exercise Prescription Increase Physical Activity;Increase Strength and Stamina;Understanding of Exercise Prescription   Comments Reviewed home exercise with pt today.  Pt plans to walk in house and use staff videos at home for exercise.  Reviewed THR, pulse, RPE, sign and symptoms, pulse oximetery and when to call 911 or MD.  Also discussed weather considerations and indoor options.  Pt voiced understanding.  Lauren Escobar is doing well in rehab. Her strength is getting a little better.  She is still struggling with aches and pains as well as muscle cramps in her legs. Lauren Escobar is doing well in rehab. She has consistently worked at  a MET level above 2 METs. However, she has decreased her workloads on the T4 Nustep and arm crank. She was also up to 32 laps on the track but has recently walked 15 and 22 laps. We will encourage Lauren Escobar to continue to progressively increase her workloads back up. We will continue to monitor her progress in the program. Lauren Escobar has been out of rehab for several weeks and recently returned back on 2/28. She had a flare up with her bronchitis and was informed by her MD that she can start exercise again. She eased back into exercise and still walked 15 laps on the track and level 3 on the T4 Nustep. Will continue to monitor. Lauren Escobar has not attended rehab since the last review. She returned to rehab for three sessions before having difficulty with her bronchitis again. We will continue to stay in contact with patient to see when she is ready to return to rehab. We will continue to monitor her progress. Lauren Escobar has not been to rehab since 3/4. She has been out sick. Called patient to receive update and she was told by her MD that she needs to pause rehab at this time due to having higher HR and BP - unsure for how long. Staff will contact doctor office to find out plan, and potentially discharge patient pending on what MD says.   Expected Outcomes Short: Start to add in exercsie at home Long: conitnue to improve stamina Short: Continue to increase workloads back up to previous levels. Long: Continue to improve strength and stamina. Short: Ease back into exercise regiment Long: Continue to increase overall MET level and strength Short: Return to regular attendance in the program. Long: Continue to improve stamina. Short: Receive update from doctor Long: Finish program if appropriate to come back    Row Name 02/10/23 1412             Exercise Goal Re-Evaluation   Comments Lauren Escobar has not been to rehab since 3/4. We spoke to doctor who did not state a time frame of stopping the program. Called patient to attempt to  follow up x2 and no response back. We  have sent her a discharge letter at this time.       Expected Outcomes Short: Return to rehab if appropriate. Long: Finish program if appropriate to return.                Discharge Exercise Prescription (Final Exercise Prescription Changes):  Exercise Prescription Changes - 12/30/22 1300       Response to Exercise   Blood Pressure (Admit) 116/60    Blood Pressure (Exit) 110/64    Heart Rate (Admit) 88 bpm    Heart Rate (Exercise) 111 bpm    Heart Rate (Exit) 95 bpm    Oxygen Saturation (Admit) 98 %    Oxygen Saturation (Exercise) 95 %    Oxygen Saturation (Exit) 98 %    Rating of Perceived Exertion (Exercise) 15    Perceived Dyspnea (Exercise) 2    Symptoms SOB    Comments return back from 2/9    Duration Continue with 30 min of aerobic exercise without signs/symptoms of physical distress.    Intensity THRR unchanged      Progression   Progression Continue to progress workloads to maintain intensity without signs/symptoms of physical distress.    Average METs 2.04      Resistance Training   Training Prescription Yes    Weight 3 lb    Reps 10-15      Interval Training   Interval Training No      NuStep   Level 3    Minutes 30    METs 2.3      Arm Ergometer   Level 1    Minutes 15    METs 2      Track   Laps 15    Minutes 15    METs 1.82      Home Exercise Plan   Plans to continue exercise at Home (comment)   walking, staff videos   Frequency Add 2 additional days to program exercise sessions.    Initial Home Exercises Provided 12/04/22      Oxygen   Maintain Oxygen Saturation 88% or higher             Nutrition:  Target Goals: Understanding of nutrition guidelines, daily intake of sodium 1500mg , cholesterol 200mg , calories 30% from fat and 7% or less from saturated fats, daily to have 5 or more servings of fruits and vegetables.  Education: All About Nutrition: -Group instruction provided by verbal,  written material, interactive activities, discussions, models, and posters to present general guidelines for heart healthy nutrition including fat, fiber, MyPlate, the role of sodium in heart healthy nutrition, utilization of the nutrition label, and utilization of this knowledge for meal planning. Follow up email sent as well. Written material given at graduation.   Biometrics:  Pre Biometrics - 09/30/22 1104       Pre Biometrics   Height 5' 2.1" (1.577 m)    Weight 169 lb (76.7 kg)    Waist Circumference 38 inches    Hip Circumference 40 inches    Waist to Hip Ratio 0.95 %    BMI (Calculated) 30.82    Single Leg Stand 0.9 seconds              Nutrition Therapy Plan and Nutrition Goals:  Nutrition Therapy & Goals - 09/30/22 0950       Nutrition Therapy   Diet Heart healthy, low Na, pulmonary and T2DM MNT    Protein (specify units) 90-100g    Fiber 25 grams  Whole Grain Foods 3 servings    Saturated Fats 14 max. grams    Fruits and Vegetables 8 servings/day    Sodium 2 grams      Personal Nutrition Goals   Nutrition Goal ST: include protein, fiber, and healthy fats when eating. Consider adding in nutrition shake such as ensure or boost. Add at least 2 meals/snacks LT: meet energy and protein needs, include at least 25g of fiber per day, limit Na <2g/day, limit saturated fat <14g/day, maintain A1C <7.    Comments 57 y.o. F admitted to pulmonary rehab for centrilobular emphysema. PMHx includes HTN, HLD, T2DM, arthritis, asthma, ataxia, COPD, depression, migraines, GERD, early satiety (2018). Relevant medications includes valium, nexium, prednisone, xanax, vyvanse, breo ellipta. Marlita does not have teeth or dentures at this time and reports that she is unable to get them as she went too long without them - she reports being able to eat most foods, reviewed considerations to help with eating and swallowing. Sherina reports having a very low appetite and forces herself to eat 1  small meal (about a fistfull of food). She reports that she is unsure why, but her weight has been trending upward: discussed body compensatory mechanisms with low intake of food as well as fluid retention. Annis reports drinking coffee with french vanilla creamer and regular mountain dew during the day, she does not like diet soda and feels that water makes her bloat - discussed nutrition considerations with sugar sweetened beverages. Sharai reports that this is new to her and doesn't have much knowledge on nutrition - discussed heart healthy eating, pulmonary MNT, and T2DM MNT; discussed while MyPlate guidelines may be hard with her small portions, that she should include fiber, healthy fat, and protein when she eats and reviewed foods to include and consider in each group. Discussed mechanical eating (eating outside of hunger) by adding in at least 2 more meals/snacks as well as consider adding in a nutritional shake such as ensure of boost at least 1x/day to help meet her needs while she includes more meals/snacks to help fill gaps in nutrition and to ensure she is meeting her nutritional needs. Jonessa is open to making changes, but reports her biggest barrier will be the mechanical eating of foods - encouraged her that making changes can take time and it is about progress, not perfection.      Intervention Plan   Intervention Prescribe, educate and counsel regarding individualized specific dietary modifications aiming towards targeted core components such as weight, hypertension, lipid management, diabetes, heart failure and other comorbidities.;Nutrition handout(s) given to patient.    Expected Outcomes Short Term Goal: Understand basic principles of dietary content, such as calories, fat, sodium, cholesterol and nutrients.;Short Term Goal: A plan has been developed with personal nutrition goals set during dietitian appointment.;Long Term Goal: Adherence to prescribed nutrition plan.              Nutrition Assessments:  MEDIFICTS Score Key: ?70 Need to make dietary changes  40-70 Heart Healthy Diet ? 40 Therapeutic Level Cholesterol Diet  Flowsheet Row Pulmonary Rehab from 09/30/2022 in [facility] Cardiac and Pulmonary Rehab  Picture Your Plate Total Score on Admission 55      Picture Your Plate Scores: <40 Unhealthy dietary pattern with much room for improvement. 41-50 Dietary pattern unlikely to meet recommendations for good health and room for improvement. 51-60 More healthful dietary pattern, with some room for improvement.  >60 Healthy dietary pattern, although there may be some specific behaviors that  could be improved.   Nutrition Goals Re-Evaluation:  Nutrition Goals Re-Evaluation     Row Name 11/01/22 0941 12/04/22 0941           Goals   Current Weight 162 lb (73.5 kg) --      Nutrition Goal Eat more foods. Short: Choose and plan snacks accordingly to patients caloric intake to improve breathing. Long: Maintain a diet independently that meets their caloric intake to aid in daily shortness of breath.      Comment Lauren Escobar states that her appetite is not good and sometimes goes days without eating.Patient was informed on why it is important to maintain a balanced diet when dealing with Respiratory issues. Explained that it takes a lot of energy to breath and when they are short of breath often they will need to have a good diet to help keep up with the calories they are expending for breathing. Lauren Escobar is doing well in rehab. She has no appetite.  Her boyfriend forces her to eat but it will sometimes make her feel sick to her stomach.  We talked about the need to make sure she is doing mechanical eating to [facility] caloric need.  We also talked about tyring protein shakes or meal replacement shakes.      Expected Outcome Short: Choose and plan snacks accordingly to patients caloric intake to improve breathing. Long: Maintain a diet independently that meets their caloric  intake to aid in daily shortness of breath. Short: Try mechanical eating/shakes Long: conitnue to eat regularly               Nutrition Goals Discharge (Final Nutrition Goals Re-Evaluation):  Nutrition Goals Re-Evaluation - 12/04/22 0941       Goals   Nutrition Goal Short: Choose and plan snacks accordingly to patients caloric intake to improve breathing. Long: Maintain a diet independently that meets their caloric intake to aid in daily shortness of breath.    Comment Lauren Escobar is doing well in rehab. She has no appetite.  Her boyfriend forces her to eat but it will sometimes make her feel sick to her stomach.  We talked about the need to make sure she is doing mechanical eating to [facility] caloric need.  We also talked about tyring protein shakes or meal replacement shakes.    Expected Outcome Short: Try mechanical eating/shakes Long: conitnue to eat regularly             Psychosocial: Target Goals: Acknowledge presence or absence of significant depression and/or stress, maximize coping skills, provide positive support system. Participant is able to verbalize types and ability to use techniques and skills needed for reducing stress and depression.   Education: Stress, Anxiety, and Depression - Group verbal and visual presentation to define topics covered.  Reviews how body is impacted by stress, anxiety, and depression.  Also discusses healthy ways to reduce stress and to treat/manage anxiety and depression.  Written material given at graduation. Flowsheet Row Pulmonary Rehab from 12/25/2022 in [facility] Cardiac and Pulmonary Rehab  Date 10/30/22  Educator [facility]  Instruction Review Code 1- [employer] Understanding       Education: Sleep Hygiene -Provides group verbal and written instruction about how sleep can affect your health.  Define sleep hygiene, discuss sleep cycles and impact of sleep habits. Review good sleep hygiene tips.    Initial Review & Psychosocial Screening:  Initial  Psych Review & Screening - 09/23/22 1027       Initial Review   Current issues with  None Identified      Family Dynamics   Good Support System? Yes   daughter     Barriers   Psychosocial barriers to participate in program There are no identifiable barriers or psychosocial needs.      Screening Interventions   Interventions Encouraged to exercise;To provide support and resources with identified psychosocial needs;Provide feedback about the scores to participant    Expected Outcomes Short Term goal: Utilizing psychosocial counselor, staff and physician to assist with identification of specific Stressors or current issues interfering with healing process. Setting desired goal for each stressor or current issue identified.;Long Term Goal: Stressors or current issues are controlled or eliminated.;Short Term goal: Identification and review with participant of any Quality of Life or Depression concerns found by scoring the questionnaire.;Long Term goal: The participant improves quality of Life and PHQ9 Scores as seen by post scores and/or verbalization of changes             Quality of Life Scores:  Scores of 19 and below usually indicate a poorer quality of life in these areas.  A difference of  2-3 points is a clinically meaningful difference.  A difference of 2-3 points in the total score of the Quality of Life Index has been associated with significant improvement in overall quality of life, self-image, physical symptoms, and general health in studies assessing change in quality of life.  PHQ-9: Review Flowsheet  More data exists      12/30/2022 12/04/2022 11/01/2022 10/30/2022 09/30/2022  Depression screen PHQ 2/9  Decreased Interest 3 0 3 0 0  Down, Depressed, Hopeless 3 3 3  0 0  PHQ - 2 Score 6 3 6  0 0  Altered sleeping 3 3 3 3  0  Tired, decreased energy 3 3 3 3 3   Change in appetite 3 3 3 3 3   Feeling bad or failure about yourself  3 3 3 3 3   Trouble concentrating 3 0 3 3 3   Moving  slowly or fidgety/restless 3 3 3  0 0  Suicidal thoughts 0 0 0 0 0  PHQ-9 Score 24 18 24 15 12   Difficult doing work/chores Very difficult Extremely dIfficult Extremely dIfficult Somewhat difficult Extremely dIfficult   Interpretation of Total Score  Total Score Depression Severity:  1-4 = Minimal depression, 5-9 = Mild depression, 10-14 = Moderate depression, 15-19 = Moderately severe depression, 20-27 = Severe depression   Psychosocial Evaluation and Intervention:  Psychosocial Evaluation - 09/23/22 1037       Psychosocial Evaluation & Interventions   Interventions Encouraged to exercise with the program and follow exercise prescription    Comments Lauren Escobar has no barriers to attending the program. She hopes to see improvement in her shortness of breath symptoms with exertion. She lives alone. Her daughter is her support person.  She has been diagnosed with diabetes and is diet controlled at this moment. She has another A1C scheduled in Dec. She is ready to start the program and progress her exercise prescription, learn energy conservation and improve her stamina.    Expected Outcomes STG Lauren Escobar will attend all scheduled sessions, she will meet with our LDN to review safe ways to work on weight loss and help with diabetes control. She will begin an exercise progression.  LTG Lauren Escobar will continue to utilize the tips for exercise, breathing and risk factor control after discharge from the program.    Continue Psychosocial Services  Follow up required by staff  Psychosocial Re-Evaluation:  Psychosocial Re-Evaluation     Row Name 11/01/22 0935 12/04/22 0933 12/30/22 1610         Psychosocial Re-Evaluation   Current issues with Current Depression;Current Sleep Concerns;Current Anxiety/Panic;History of Depression;Current Stress Concerns Current Depression;Current Sleep Concerns;Current Anxiety/Panic;History of Depression;Current Stress Concerns Current Depression;Current Sleep  Concerns;Current Anxiety/Panic;History of Depression;Current Stress Concerns     Comments Reviewed patient health questionnaire (PHQ-9) with patient for follow up. Previously, patients score indicated signs/symptoms of depression.  Reviewed to see if patient is improving symptom wise while in program.  Score declined and patient states that it is because she is stressedx with her health. Her stressers are her kids and she issues with her daughter. Her daughter has three children and has taken over her house. She is paying the bills and not staying at her own home. Informed her to seek help with a therapist or the doctor for medication for her mental well being. Kinzlie is doing well in rehab. Her PHQ score did improve some.  A lot of her issues seem to stem from her lack of sleep.  She is only able to sleep for about an hour before she wakes up.  She has done a sleep study in the past, but it did not go well and she never went back for another.  We talked about how her lack of sleep is spilling out into the rest of life.  She was encouraged to contact her doctor about another sleep study.  She is working with her doctor to get in with a depression doctor.   She had one previously, but they lost their liscence.  She also has bladder disease and trying to find a good doctor.  Her mom had the same thing that turned into caner.  We looked up [facility] and gave her contact info. Seniya reports dealing with some anxiety mostly related to her health and family. Her PHQ score has increased from 18 to 24. She states that her sleep concerns are also related to her nerves and anxiety. She states that she was previously on anxiety medications but came off of that medication when the doctor who prescribed it quit practicing. We encouraged her to talk to her doctor about her lack of sleep and the possibility of going back on anxiety medication.     Expected Outcomes Short: talk to doctor about possible nmedication.  Long: maintain a positive mental state. Short: Talk to doctor about sleep and bladder Long: Continue to exercise for mental boost Short: Talk to doctor about sleep and anxiety. Long: Continue to exercise for mental boost     Interventions Encouraged to attend Pulmonary Rehabilitation for the exercise;Stress management education;Relaxation education Encouraged to attend Pulmonary Rehabilitation for the exercise;Stress management education;Relaxation education Encouraged to attend Pulmonary Rehabilitation for the exercise;Stress management education;Relaxation education     Continue Psychosocial Services  Follow up required by staff Follow up required by staff Follow up required by staff       Initial Review   Source of Stress Concerns -- -- Chronic Illness;Family              Psychosocial Discharge (Final Psychosocial Re-Evaluation):  Psychosocial Re-Evaluation - 12/30/22 0928       Psychosocial Re-Evaluation   Current issues with Current Depression;Current Sleep Concerns;Current Anxiety/Panic;History of Depression;Current Stress Concerns    Comments Lauren Escobar reports dealing with some anxiety mostly related to her health and family. Her PHQ score has increased from 18 to 24. She states  that her sleep concerns are also related to her nerves and anxiety. She states that she was previously on anxiety medications but came off of that medication when the doctor who prescribed it quit practicing. We encouraged her to talk to her doctor about her lack of sleep and the possibility of going back on anxiety medication.    Expected Outcomes Short: Talk to doctor about sleep and anxiety. Long: Continue to exercise for mental boost    Interventions Encouraged to attend Pulmonary Rehabilitation for the exercise;Stress management education;Relaxation education    Continue Psychosocial Services  Follow up required by staff      Initial Review   Source of Stress Concerns Chronic Illness;Family              Education: Education Goals: Education classes will be provided on a weekly basis, covering required topics. Participant will state understanding/return demonstration of topics presented.  Learning Barriers/Preferences:   General Pulmonary Education Topics:  Infection Prevention: - Provides verbal and written material to individual with discussion of infection control including proper hand washing and proper equipment cleaning during exercise session. Flowsheet Row Pulmonary Rehab from 12/25/2022 in [facility] Cardiac and Pulmonary Rehab  Date 09/30/22  Educator [facility]  Instruction Review Code 1- Verbalizes Understanding       Falls Prevention: - Provides verbal and written material to individual with discussion of falls prevention and safety. Flowsheet Row Pulmonary Rehab from 12/25/2022 in [facility] Cardiac and Pulmonary Rehab  Date 09/23/22  Educator SB  Instruction Review Code 1- Verbalizes Understanding       Chronic Lung Disease Review: - Group verbal instruction with posters, models, PowerPoint presentations and videos,  to review new updates, new respiratory medications, new advancements in procedures and treatments. Providing information on websites and "800" numbers for continued self-education. Includes information about supplement oxygen, available portable oxygen systems, continuous and intermittent flow rates, oxygen safety, concentrators, and Medicare reimbursement for oxygen. Explanation of Pulmonary Drugs, including class, frequency, complications, importance of spacers, rinsing mouth after steroid MDI's, and proper cleaning methods for nebulizers. Review of basic lung anatomy and physiology related to function, structure, and complications of lung disease. Review of risk factors. Discussion about methods for diagnosing sleep apnea and types of masks and machines for OSA. Includes a review of the use of types of environmental controls: home humidity, furnaces, filters, dust mite/pet  prevention, HEPA vacuums. Discussion about weather changes, air quality and the benefits of nasal washing. Instruction on Warning signs, infection symptoms, calling MD promptly, preventive modes, and value of vaccinations. Review of effective airway clearance, coughing and/or vibration techniques. Emphasizing that all should Create an Action Plan. Written material given at graduation. Flowsheet Row Pulmonary Rehab from 12/25/2022 in [facility] Cardiac and Pulmonary Rehab  Education need identified 09/30/22       AED/CPR: - Group verbal and written instruction with the use of models to demonstrate the basic use of the AED with the basic ABC's of resuscitation.    Anatomy and Cardiac Procedures: - Group verbal and visual presentation and models provide information about basic cardiac anatomy and function. Reviews the testing methods done to diagnose heart disease and the outcomes of the test results. Describes the treatment choices: Medical Management, Angioplasty, or Coronary Bypass Surgery for treating various heart conditions including Myocardial Infarction, Angina, Valve Disease, and Cardiac Arrhythmias.  Written material given at graduation. Flowsheet Row Pulmonary Rehab from 12/25/2022 in [facility] Cardiac and Pulmonary Rehab  Date 12/04/22  Educator SB  Instruction Review Code 1-  Verbalizes Understanding       Medication Safety: - Group verbal and visual instruction to review commonly prescribed medications for heart and lung disease. Reviews the medication, class of the drug, and side effects. Includes the steps to properly store meds and maintain the prescription regimen.  Written material given at graduation. Flowsheet Row Pulmonary Rehab from 12/25/2022 in [facility] Cardiac and Pulmonary Rehab  Date 10/09/22  Educator SB  Instruction Review Code 1- Verbalizes Understanding       Other: -Provides group and verbal instruction on various topics (see comments)   Knowledge Questionnaire Score:   Knowledge Questionnaire Score - 09/30/22 1107       Knowledge Questionnaire Score   Pre Score 16/18              Core Components/Risk Factors/Patient Goals at Admission:  Personal Goals and Risk Factors at Admission - 09/30/22 1107       Core Components/Risk Factors/Patient Goals on Admission    Weight Management Yes;Obesity;Weight Loss    Intervention Weight Management: Develop a combined nutrition and exercise program designed to reach desired caloric intake, while maintaining appropriate intake of nutrient and fiber, sodium and fats, and appropriate energy expenditure required for the weight goal.;Weight Management: Provide education and appropriate resources to help participant work on and attain dietary goals.;Weight Management/Obesity: Establish reasonable short term and long term weight goals.;Obesity: Provide education and appropriate resources to help participant work on and attain dietary goals.    Admit Weight 169 lb (76.7 kg)    Goal Weight: Short Term 165 lb (74.8 kg)    Goal Weight: Long Term 130 lb (59 kg)    Expected Outcomes Short Term: Continue to assess and modify interventions until short term weight is achieved;Long Term: Adherence to nutrition and physical activity/exercise program aimed toward attainment of established weight goal;Weight Loss: Understanding of general recommendations for a balanced deficit meal plan, which promotes 1-2 lb weight loss per week and includes a negative energy balance of [phone] kcal/d;Understanding recommendations for meals to include 15-35% energy as protein, 25-35% energy from fat, 35-60% energy from carbohydrates, less than 200mg  of dietary cholesterol, 20-35 gm of total fiber daily;Understanding of distribution of calorie intake throughout the day with the consumption of 4-5 meals/snacks    Improve shortness of breath with ADL's Yes    Intervention Provide education, individualized exercise plan and daily activity instruction to help  decrease symptoms of SOB with activities of daily living.    Expected Outcomes Short Term: Improve cardiorespiratory fitness to achieve a reduction of symptoms when performing ADLs;Long Term: Be able to perform more ADLs without symptoms or delay the onset of symptoms    Increase knowledge of respiratory medications and ability to use respiratory devices properly  Yes    Intervention Provide education and demonstration as needed of appropriate use of medications, inhalers, and oxygen therapy.    Expected Outcomes Short Term: Achieves understanding of medications use. Understands that oxygen is a medication prescribed by physician. Demonstrates appropriate use of inhaler and oxygen therapy.;Long Term: Maintain appropriate use of medications, inhalers, and oxygen therapy.    Diabetes Yes    Intervention Provide education about signs/symptoms and action to take for hypo/hyperglycemia.;Provide education about proper nutrition, including hydration, and aerobic/resistive exercise prescription along with prescribed medications to achieve blood glucose in normal ranges: Fasting glucose 65-99 mg/dL    Expected Outcomes Short Term: Participant verbalizes understanding of the signs/symptoms and immediate care of hyper/hypoglycemia, proper foot care and importance of medication, aerobic/resistive  exercise and nutrition plan for blood glucose control.;Long Term: Attainment of HbA1C < 7%.    Hypertension Yes    Intervention Provide education on lifestyle modifcations including regular physical activity/exercise, weight management, moderate sodium restriction and increased consumption of fresh fruit, vegetables, and low fat dairy, alcohol moderation, and smoking cessation.;Monitor prescription use compliance.    Expected Outcomes Short Term: Continued assessment and intervention until BP is < 140/70mm HG in hypertensive participants. < 130/47mm HG in hypertensive participants with diabetes, heart failure or chronic  kidney disease.;Long Term: Maintenance of blood pressure at goal levels.             Education:Diabetes - Individual verbal and written instruction to review signs/symptoms of diabetes, desired ranges of glucose level fasting, after meals and with exercise. Acknowledge that pre and post exercise glucose checks will be done for 3 sessions at entry of program. Flowsheet Row Pulmonary Rehab from 12/25/2022 in [facility] Cardiac and Pulmonary Rehab  Date 09/30/22  Educator [facility]  Instruction Review Code 1- Verbalizes Understanding       Know Your Numbers and Heart Failure: - Group verbal and visual instruction to discuss disease risk factors for cardiac and pulmonary disease and treatment options.  Reviews associated critical values for Overweight/Obesity, Hypertension, Cholesterol, and Diabetes.  Discusses basics of heart failure: signs/symptoms and treatments.  Introduces Heart Failure Zone chart for action plan for heart failure.  Written material given at graduation. Flowsheet Row Pulmonary Rehab from 12/25/2022 in [facility] Cardiac and Pulmonary Rehab  Date 12/25/22  Educator [facility]  Instruction Review Code 1- Verbalizes Understanding       Core Components/Risk Factors/Patient Goals Review:   Goals and Risk Factor Review     Row Name 11/01/22 [phone] 12/04/22 0944           Core Components/Risk Factors/Patient Goals Review   Personal Goals Review Improve shortness of breath with ADL's Improve shortness of breath with ADL's;Weight Management/Obesity;Increase knowledge of respiratory medications and ability to use respiratory devices properly.;Hypertension      Review Spoke to patient about their shortness of breath and what they can do to improve. Patient has been informed of breathing techniques when starting the program. Patient is informed to tell staff if they have had any med changes and that certain meds they are taking or not taking can be causing shortness of breath. Lauren Escobar is doing well in  rehab.  Her weight is maintain despite not eating.  Since her mom passed, she gained weight but previously could breathe better so she would like to lose again.  She still is struggling with her breathing and it limits how much she is able to do each day.  She uses her inhaler daily and would like to get back to using a nubulizer, but need her doctor to order one.  Her pressures are good in class, but she does not check them at home.      Expected Outcomes Short: Attend [facility] regularly to improve shortness of breath with ADL's. Long: maintain independence with ADL's Short: Talk to doctor about nebulizers Long: COnitnue to work manage lung disease               Core Components/Risk Factors/Patient Goals at Discharge (Final Review):   Goals and Risk Factor Review - 12/04/22 0944       Core Components/Risk Factors/Patient Goals Review   Personal Goals Review Improve shortness of breath with ADL's;Weight Management/Obesity;Increase knowledge of respiratory medications and ability to use respiratory devices properly.;Hypertension  Review Lauren Escobar is doing well in rehab.  Her weight is maintain despite not eating.  Since her mom passed, she gained weight but previously could breathe better so she would like to lose again.  She still is struggling with her breathing and it limits how much she is able to do each day.  She uses her inhaler daily and would like to get back to using a nubulizer, but need her doctor to order one.  Her pressures are good in class, but she does not check them at home.    Expected Outcomes Short: Talk to doctor about nebulizers Long: COnitnue to work manage lung disease             ITP Comments:  ITP Comments     Row Name 09/23/22 1048 09/30/22 1058 10/07/22 0955 10/23/22 1034 11/20/22 0956   ITP Comments Virtual orientation call completed today. shehas an appointment on Date: 09/30/2022  for EP eval and gym Orientation.  Documentation of diagnosis can be found in  [facility] Date: 09/11/2022 . Completed and gym orientation. Initial ITP created and sent for review to Dr. Jinny Sanders, Medical Director. First full day of exercise!  Patient was oriented to gym and equipment including functions, settings, policies, and procedures.  Patient's individual exercise prescription and treatment plan were reviewed.  All starting workloads were established based on the results of the 6 minute walk test done at initial orientation visit.  The plan for exercise progression was also introduced and progression will be customized based on patient's performance and goals. 30 Day review completed. Medical Director ITP review done, changes made as directed, and signed approval by Medical Director.     new to prgram 30 Day review completed. Medical Director ITP review done, changes made as directed, and signed approval by Medical Director.    Row Name 12/18/22 1424 12/18/22 1707 01/07/23 1546 01/15/23 0914 01/27/23 1159   ITP Comments 30 day review completed. ITP sent to Dr. Jinny Sanders, Medical Director of  Pulmonary Rehab. Continue with ITP unless changes are made by physician. Called patient to check in on patient who has been feeling sick. Has not attended rehab since 2/9. She was told she had a flare up with bronchitis. She states she spoke with her doctor who told her to stay out of exercise until 2/28. She was given new inhalers. Patient will call us if she still does not feel good to come back next week. Called to check on patient.  She has been out with bronchitis.  However, she has also been having nose bleeds for the past four days.  She was seen by hematology last week and was encouraged to call them again to let them know about nose bleeds. We will take her out for the week to recover from bronchitis and she will let us know about next week.  Unable to assess for goals this term. 30 Day review completed. Medical Director ITP review done, changes made as directed, and signed  approval by Medical Director.   remains out with medical concern Lauren Escobar has not been to rehab since 3/4. She has been out sick. Called patient to receive update and she was told by her MD that she needs to pause rehab at this time due to having higher HR and BP - unsure for how long. Staff will contact doctor office to find out plan, and potentially discharge patient pending on what MD says. Will take out appts the next couple of weeks at  this time.    Row Name 01/27/23 1534 02/03/23 1325 02/12/23 1329       ITP Comments Called Dr. Larrie Kass office to receive clarifcation. Dr. Hessie Diener did not state a timeframe of stopping program. Called patient to attempt to follow up x2 and no response back. Will send letter at this time. HAs not attended rehab since 3/4. 30 day review completed. ITP sent to Dr. Jinny Sanders, Medical Director of  Pulmonary Rehab. Continue with ITP unless changes are made by physician.  No goal review as she has not attended since 12/30/22.              Comments: 30 day review

## 2023-02-14 ENCOUNTER — Ambulatory Visit: Payer: 59

## 2023-02-17 DIAGNOSIS — J432 Centrilobular emphysema: Secondary | ICD-10-CM

## 2023-02-17 DIAGNOSIS — R0602 Shortness of breath: Secondary | ICD-10-CM

## 2023-02-17 NOTE — Progress Notes (Signed)
Pulmonary Individual Treatment Plan  Patient Details  Name: Lauren Escobar MRN: [id number] Date of Birth: 1966/03/21 Referring Provider:   Flowsheet Row Pulmonary Rehab from 09/30/2022 in [facility] Cardiac and Pulmonary Rehab  Referring Provider Raechel Chute MD       Initial Encounter Date:  Flowsheet Row Pulmonary Rehab from 09/30/2022 in [facility] Cardiac and Pulmonary Rehab  Date 09/30/22       Visit Diagnosis: Centrilobular emphysema  Shortness of breath  Patient's Home Medications on Admission:  Current Outpatient Medications:    albuterol (VENTOLIN HFA) 108 (90 Base) MCG/ACT inhaler, Inhale into the lungs., Disp: , Rfl:    aspirin EC 81 MG tablet, Take by mouth., Disp: , Rfl:    atorvastatin (LIPITOR) 20 MG tablet, TAKE 1 TABLET BY MOUTH AT BEDTIME FOR HIGH CHOLESTEROL, Disp: , Rfl:    cetirizine (ZYRTEC) 10 MG tablet, TAKE 1 TABLET BY MOUTH DAILY FOR ALLERGIES, Disp: , Rfl:    dextromethorphan-guaiFENesin (MUCINEX DM) 30-600 MG 12hr tablet, Take 1 tablet by mouth 2 (two) times daily as needed for cough., Disp: 14 tablet, Rfl: 0   esomeprazole (NEXIUM) 40 MG capsule, Take 40 mg by mouth daily. , Disp: , Rfl:    ferrous sulfate 325 (65 FE) MG tablet, Take by mouth., Disp: , Rfl:    fluticasone furoate-vilanterol (BREO ELLIPTA) 100-25 MCG/ACT AEPB, Inhale 1 puff into the lungs daily., Disp: 30 each, Rfl: 11   folic acid (FOLVITE) 1 MG tablet, Take 1 mg by mouth daily., Disp: , Rfl:    isosorbide mononitrate (IMDUR) 30 MG 24 hr tablet, Take 30 mg by mouth daily., Disp: , Rfl:    Multiple Vitamins-Minerals (MULTIVIT/MULTIMINERAL ADULT PO), Take by mouth., Disp: , Rfl:    nicotine (NICODERM CQ - DOSED IN MG/24 HOURS) 21 mg/24hr patch, Place 1 patch (21 mg total) onto the skin daily., Disp: 28 patch, Rfl: 0   NITROGLYCERIN SL, Place 1 tablet under the tongue. (Patient not taking: Reported on 09/11/2022), Disp: , Rfl:    oseltamivir (TAMIFLU) 75 MG capsule, Take 1 capsule (75 mg total)  by mouth 2 (two) times daily., Disp: 5 capsule, Rfl: 0   promethazine (PHENERGAN) 12.5 MG tablet, Take 1 tablet (12.5 mg total) by mouth every 6 (six) hours as needed for nausea or vomiting., Disp: 20 tablet, Rfl: 0   propranolol (INDERAL) 40 MG tablet, TAKE 1 TABLET TWICE DAILY TO PREVENT HEADACHES, Disp: , Rfl:    REXULTI 0.5 MG TABS, TAKE 1 TABLET BY MOUTH AT BEDTIME FOR 1 WEEK THEN INCREASE TO 1 MG TABLET, Disp: 67 tablet, Rfl: 1   risperiDONE (RISPERDAL) 1 MG tablet, Take 0.5 tablets (0.5 mg total) by mouth at bedtime for 7 days, THEN 1 tablet (1 mg total) at bedtime., Disp: 33.5 tablet, Rfl: 0   venlafaxine XR (EFFEXOR-XR) 150 MG 24 hr capsule, Take 2 capsules (300 mg total) by mouth every morning., Disp: 30 capsule, Rfl: 3   VRAYLAR 1.5 MG capsule, Take 1 capsule (1.5 mg total) by mouth daily., Disp: 30 capsule, Rfl: 3  Past Medical History: Past Medical History:  Diagnosis Date   Abdominal pain    Abnormal mammogram    Anxiety    Asthma    Bleeding disorder (HCC)    Blood dyscrasia    polycythemia vera   Chronic kidney disease    COPD (chronic obstructive pulmonary disease) (HCC)    Cough    Depression    Diabetes mellitus without complication (HCC)    Fatigue  Headache 2000   Horizontal nystagmus    age 4   Hyperglycemia    Hypertension    Low back pain    Major depressive disorder    Nasal lesion    Neck pain    Snoring    SOB (shortness of breath)    Tobacco use    UTI (urinary tract infection)    Von Willebrand's disease (HCC)     Tobacco Use: Social History   Tobacco Use  Smoking Status Former   Packs/day: 2.75   Years: 29.00   Additional pack years: 0.00   Total pack years: 79.75   Types: Cigarettes   Quit date: 10/2020   Years since quitting: 2.3  Smokeless Tobacco Never    Labs: Review Flowsheet       Latest Ref Rng & Units 04/06/2013 01/01/2014 06/16/2017 10/15/2022  Labs for ITP Cardiac and Pulmonary Rehab  Cholestrol 0 - 200 mg/dL 161   096  - 045   LDL (calc) 0 - 99 mg/dL 34  40  - 92   HDL-C >40 mg/dL 32  34  - 49   Trlycerides <150 mg/dL 981  191  - 478   Hemoglobin A1c 4.8 - 5.6 % 5.2  5.5  5.5  6.1   Bicarbonate 20.0 - 28.0 mmol/L - - - 23.4   Acid-base deficit 0.0 - 2.0 mmol/L - - - 0.7   O2 Saturation % - - - 84.1      Pulmonary Assessment Scores:  Pulmonary Assessment Scores     Row Name 09/30/22 1109         ADL UCSD   ADL Phase Entry     SOB Score total 62     Rest 1     Walk 2     Stairs 5     Bath 4     Dress 2     Shop 3       CAT Score   CAT Score 20       mMRC Score   mMRC Score 3              UCSD: Self-administered rating of dyspnea associated with activities of daily living (ADLs) 6-point scale (0 = "not at all" to 5 = "maximal or unable to do because of breathlessness")  Scoring Scores range from 0 to 120.  Minimally important difference is 5 units  CAT: CAT can identify the health impairment of COPD patients and is better correlated with disease progression.  CAT has a scoring range of zero to 40. The CAT score is classified into four groups of low (less than 10), medium (10 - 20), high (21-30) and very high (31-40) based on the impact level of disease on health status. A CAT score over 10 suggests significant symptoms.  A worsening CAT score could be explained by an exacerbation, poor medication adherence, poor inhaler technique, or progression of COPD or comorbid conditions.  CAT MCID is 2 points  mMRC: mMRC (Modified Medical Research Council) Dyspnea Scale is used to assess the degree of baseline functional disability in patients of respiratory disease due to dyspnea. No minimal important difference is established. A decrease in score of 1 point or greater is considered a positive change.   Pulmonary Function Assessment:  Pulmonary Function Assessment - 09/30/22 1109       Breath   Shortness of Breath Yes;Fear of Shortness of Breath;Limiting activity;Panic with  Shortness of Breath  Exercise Target Goals: Exercise Program Goal: Individual exercise prescription set using results from initial 6 min walk test and THRR while considering  patient's activity barriers and safety.   Exercise Prescription Goal: Initial exercise prescription builds to 30-45 minutes a day of aerobic activity, 2-3 days per week.  Home exercise guidelines will be given to patient during program as part of exercise prescription that the participant will acknowledge.  Education: Aerobic Exercise: - Group verbal and visual presentation on the components of exercise prescription. Introduces F.I.T.T principle from ACSM for exercise prescriptions.  Reviews F.I.T.T. principles of aerobic exercise including progression. Written material given at graduation.   Education: Resistance Exercise: - Group verbal and visual presentation on the components of exercise prescription. Introduces F.I.T.T principle from ACSM for exercise prescriptions  Reviews F.I.T.T. principles of resistance exercise including progression. Written material given at graduation. Flowsheet Row Pulmonary Rehab from 12/25/2022 in [facility] Cardiac and Pulmonary Rehab  Date 11/20/22  Educator [facility]  Instruction Review Code 1- Verbalizes Understanding        Education: Exercise & Equipment Safety: - Individual verbal instruction and demonstration of equipment use and safety with use of the equipment. Flowsheet Row Pulmonary Rehab from 12/25/2022 in [facility] Cardiac and Pulmonary Rehab  Date 09/30/22  Educator [facility]  Instruction Review Code 1- Verbalizes Understanding       Education: Exercise Physiology & General Exercise Guidelines: - Group verbal and written instruction with models to review the exercise physiology of the cardiovascular system and associated critical values. Provides general exercise guidelines with specific guidelines to those with heart or lung disease.    Education: Flexibility, Balance,  Mind/Body Relaxation: - Group verbal and visual presentation with interactive activity on the components of exercise prescription. Introduces F.I.T.T principle from ACSM for exercise prescriptions. Reviews F.I.T.T. principles of flexibility and balance exercise training including progression. Also discusses the mind body connection.  Reviews various relaxation techniques to help reduce and manage stress (i.e. Deep breathing, progressive muscle relaxation, and visualization). Balance handout provided to take home. Written material given at graduation. Flowsheet Row Pulmonary Rehab from 12/25/2022 in [facility] Cardiac and Pulmonary Rehab  Date 11/20/22  Educator [facility]  Instruction Review Code 1- Verbalizes Understanding       Activity Barriers & Risk Stratification:  Activity Barriers & Cardiac Risk Stratification - 09/30/22 1100       Activity Barriers & Cardiac Risk Stratification   Activity Barriers Left Knee Replacement;Shortness of Breath;Balance Concerns;History of Falls;Muscular Weakness;Deconditioning;Back Problems   occasional back pain            6 Minute Walk:  6 Minute Walk     Row Name 09/30/22 1058         6 Minute Walk   Phase Initial     Distance 1315 feet     Walk Time 6 minutes     # of Rest Breaks 0     MPH 2.49     METS 3.55     RPE 15     Perceived Dyspnea  3     VO2 Peak 12.42     Symptoms Yes (comment)     Comments SOB, Fatigue     Resting HR 83 bpm     Resting BP 124/66     Resting Oxygen Saturation  96 %     Exercise Oxygen Saturation  during 6 min walk 95 %     Max Ex. HR 106 bpm     Max Ex. BP 146/74  2 Minute Post BP 136/72       Interval HR   1 Minute HR 104     2 Minute HR 104     3 Minute HR 103     4 Minute HR 106     5 Minute HR 101     6 Minute HR 101     2 Minute Post HR 82     Interval Heart Rate? Yes       Interval Oxygen   Interval Oxygen? Yes     Baseline Oxygen Saturation % 96 %     1 Minute Oxygen Saturation % 95 %     1  Minute Liters of Oxygen 0 L  Room Air     2 Minute Oxygen Saturation % 96 %     2 Minute Liters of Oxygen 0 L     3 Minute Oxygen Saturation % 96 %     3 Minute Liters of Oxygen 0 L     4 Minute Oxygen Saturation % 96 %     4 Minute Liters of Oxygen 0 L     5 Minute Oxygen Saturation % 95 %     5 Minute Liters of Oxygen 0 L     6 Minute Oxygen Saturation % 96 %     6 Minute Liters of Oxygen 0 L     2 Minute Post Oxygen Saturation % 96 %     2 Minute Post Liters of Oxygen 0 L             Oxygen Initial Assessment:  Oxygen Initial Assessment - 09/30/22 1109       Home Oxygen   Home Oxygen Device None    Sleep Oxygen Prescription None    Home Exercise Oxygen Prescription None    Home Resting Oxygen Prescription None      Initial 6 min Walk   Oxygen Used None      Program Oxygen Prescription   Program Oxygen Prescription None      Intervention   Short Term Goals To learn and demonstrate proper pursed lip breathing techniques or other breathing techniques. ;To learn and understand importance of maintaining oxygen saturations>88%;To learn and demonstrate proper use of respiratory medications;To learn and understand importance of monitoring SPO2 with pulse oximeter and demonstrate accurate use of the pulse oximeter.    Long  Term Goals Maintenance of O2 saturations>88%;Exhibits proper breathing techniques, such as pursed lip breathing or other method taught during program session;Compliance with respiratory medication;Demonstrates proper use of MDI's;Verbalizes importance of monitoring SPO2 with pulse oximeter and return demonstration             Oxygen Re-Evaluation:  Oxygen Re-Evaluation     Row Name 10/07/22 0956 11/01/22 0943 12/04/22 0947         Program Oxygen Prescription   Program Oxygen Prescription -- None None       Home Oxygen   Home Oxygen Device -- None None     Sleep Oxygen Prescription -- None None     Home Exercise Oxygen Prescription -- None None      Home Resting Oxygen Prescription -- None None       Goals/Expected Outcomes   Short Term Goals -- To learn and understand importance of maintaining oxygen saturations>88%;To learn and understand importance of monitoring SPO2 with pulse oximeter and demonstrate accurate use of the pulse oximeter. To learn and understand importance of maintaining oxygen saturations>88%;To learn and understand importance of  monitoring SPO2 with pulse oximeter and demonstrate accurate use of the pulse oximeter.;To learn and demonstrate proper pursed lip breathing techniques or other breathing techniques. ;To learn and demonstrate proper use of respiratory medications     Long  Term Goals -- Maintenance of O2 saturations>88%;Verbalizes importance of monitoring SPO2 with pulse oximeter and return demonstration Verbalizes importance of monitoring SPO2 with pulse oximeter and return demonstration;Maintenance of O2 saturations>88%;Exhibits proper breathing techniques, such as pursed lip breathing or other method taught during program session;Compliance with respiratory medication;Demonstrates proper use of MDI's     Comments Reviewed PLB technique with pt.  Talked about how it works and it's importance in maintaining their exercise saturations. She does not have a pulse oximeter to check her oxygen saturation at home. Informed her where to get one and explained why it is important to have one. Reviewed that oxygen saturations should be 88 percent and above. Patient verbalizes understanding. Lauren Escobar is doing well in rehab.  She has good days and bad days with her breathing.  It is still one of her biggest limitations along with her lack of sleep.  She  does not currently have a pulse oximeter at home, but will get one from her [facility].  She has been good about using her inhaler but would like to talk with her pulmonologist about getting a nebulizer for home again.     Goals/Expected Outcomes Short: Become more profiecient  at using PLB. Long: Become independent at using PLB. Short: monitor oxygen at home with exertion. Long: maintain oxygen saturations above 88 percent independently. Short; Talk to doctor about nebulizer Long: continue to manage lung disease              Oxygen Discharge (Final Oxygen Re-Evaluation):  Oxygen Re-Evaluation - 12/04/22 0947       Program Oxygen Prescription   Program Oxygen Prescription None      Home Oxygen   Home Oxygen Device None    Sleep Oxygen Prescription None    Home Exercise Oxygen Prescription None    Home Resting Oxygen Prescription None      Goals/Expected Outcomes   Short Term Goals To learn and understand importance of maintaining oxygen saturations>88%;To learn and understand importance of monitoring SPO2 with pulse oximeter and demonstrate accurate use of the pulse oximeter.;To learn and demonstrate proper pursed lip breathing techniques or other breathing techniques. ;To learn and demonstrate proper use of respiratory medications    Long  Term Goals Verbalizes importance of monitoring SPO2 with pulse oximeter and return demonstration;Maintenance of O2 saturations>88%;Exhibits proper breathing techniques, such as pursed lip breathing or other method taught during program session;Compliance with respiratory medication;Demonstrates proper use of MDI's    Comments Lauren Escobar is doing well in rehab.  She has good days and bad days with her breathing.  It is still one of her biggest limitations along with her lack of sleep.  She  does not currently have a pulse oximeter at home, but will get one from her [facility].  She has been good about using her inhaler but would like to talk with her pulmonologist about getting a nebulizer for home again.    Goals/Expected Outcomes Short; Talk to doctor about nebulizer Long: continue to manage lung disease             Initial Exercise Prescription:  Initial Exercise Prescription - 09/30/22 1100       Date of  Initial Exercise RX and Referring Provider   Date 09/30/22  Referring Provider Armando Reichert MD      NuStep   Level 3    SPM 80    Minutes 15    METs 2      Arm Ergometer   Level 1    RPM 25    Minutes 15    METs 2      Biostep-RELP   Level 2    SPM 50    Minutes 15    METs 2      Track   Laps 34    Minutes 15    METs 2.85      Prescription Details   Frequency (times per week) 3    Duration Progress to 30 minutes of continuous aerobic without signs/symptoms of physical distress      Intensity   THRR 40-80% of Max Heartrate 115-147    Ratings of Perceived Exertion 11-13    Perceived Dyspnea 0-4      Progression   Progression Continue to progress workloads to maintain intensity without signs/symptoms of physical distress.      Resistance Training   Training Prescription Yes    Weight 3 lb    Reps 10-15             Perform Capillary Blood Glucose checks as needed.  Exercise Prescription Changes:   Exercise Prescription Changes     Row Name 09/30/22 1100 10/22/22 1400 11/04/22 1100 11/18/22 1500 12/02/22 1200     Response to Exercise   Blood Pressure (Admit) 124/66 116/70 128/64 110/60 108/60   Blood Pressure (Exercise) 146/74 134/72 134/70 128/66 --   Blood Pressure (Exit) 126/62 114/66 126/64 102/62 128/72   Heart Rate (Admit) 83 bpm 84 bpm 87 bpm 87 bpm 82 bpm   Heart Rate (Exercise) 106 bpm 102 bpm 100 bpm 98 bpm 127 bpm   Heart Rate (Exit) 77 bpm 93 bpm 82 bpm 85 bpm 94 bpm   Oxygen Saturation (Admit) 96 % 99 % 98 % 97 % 98 %   Oxygen Saturation (Exercise) 95 % 95 % 93 % 97 % 95 %   Oxygen Saturation (Exit) 98 % 98 % 98 % 99 % 98 %   Rating of Perceived Exertion (Exercise) 15 15 15 14 13    Perceived Dyspnea (Exercise) 3 3 3 2 1    Symptoms SOB, fatigue SOB SOB SOB none   Comments walk test results First 2 weeks of exercise -- -- --   Duration -- Continue with 30 min of aerobic exercise without signs/symptoms of physical distress. Continue  with 30 min of aerobic exercise without signs/symptoms of physical distress. Continue with 30 min of aerobic exercise without signs/symptoms of physical distress. Continue with 30 min of aerobic exercise without signs/symptoms of physical distress.   Intensity -- THRR unchanged THRR unchanged THRR unchanged THRR unchanged     Progression   Progression -- Continue to progress workloads to maintain intensity without signs/symptoms of physical distress. Continue to progress workloads to maintain intensity without signs/symptoms of physical distress. Continue to progress workloads to maintain intensity without signs/symptoms of physical distress. Continue to progress workloads to maintain intensity without signs/symptoms of physical distress.   Average METs -- 2 1.9 2.33 2.31     Resistance Training   Training Prescription -- Yes Yes Yes Yes   Weight -- 3 lb 3 lb 3 lb 3 lb   Reps -- 10-15 10-15 10-15 10-15     Interval Training   Interval Training -- No No No No  NuStep   Level -- 3 -- 4 --   Minutes -- 15 -- 15 --   METs -- 2 -- -- --     Arm Ergometer   Level -- 1 -- 2.1 1.7   Minutes -- 15 -- 15 15   METs -- 2 -- 2 2     T5 Nustep   Level -- -- 3 3 3    Minutes -- -- 30 30 30    METs -- -- 1.7 -- 2     Biostep-RELP   Level -- 3 1 -- 4   Minutes -- 15 15 -- 30   METs -- 2 2 -- 3     Track   Laps -- 32 -- 28 --   Minutes -- 15 -- 15 --   METs -- 2.74 -- 2.52 --     Oxygen   Maintain Oxygen Saturation -- 88% or higher 88% or higher 88% or higher 88% or higher    Row Name 12/04/22 0900 12/16/22 1400 12/30/22 1300         Response to Exercise   Blood Pressure (Admit) -- 104/64 116/60     Blood Pressure (Exit) -- 108/60 110/64     Heart Rate (Admit) -- 81 bpm 88 bpm     Heart Rate (Exercise) -- 96 bpm 111 bpm     Heart Rate (Exit) -- 82 bpm 95 bpm     Oxygen Saturation (Admit) -- 98 % 98 %     Oxygen Saturation (Exercise) -- 90 % 95 %     Oxygen Saturation (Exit) --  96 % 98 %     Rating of Perceived Exertion (Exercise) -- 13 15     Perceived Dyspnea (Exercise) -- 3 2     Symptoms -- SOB SOB     Comments -- -- return back from 2/9     Duration -- Continue with 30 min of aerobic exercise without signs/symptoms of physical distress. Continue with 30 min of aerobic exercise without signs/symptoms of physical distress.     Intensity -- THRR unchanged THRR unchanged       Progression   Progression -- Continue to progress workloads to maintain intensity without signs/symptoms of physical distress. Continue to progress workloads to maintain intensity without signs/symptoms of physical distress.     Average METs -- 2.12 2.04       Resistance Training   Training Prescription -- Yes Yes     Weight -- 3 lb 3 lb     Reps -- 10-15 10-15       Interval Training   Interval Training -- No No       NuStep   Level -- 3 3     Minutes -- 30 30     METs -- 2.4 2.3       Arm Ergometer   Level -- 1 1     Minutes -- 15 15     METs -- 1.9 2       Track   Laps -- 22 15     Minutes -- 15 15     METs -- 2.2 1.82       Home Exercise Plan   Plans to continue exercise at Home (comment)  walking, staff videos Home (comment)  walking, staff videos Home (comment)  walking, staff videos     Frequency Add 2 additional days to program exercise sessions. Add 2 additional days to program exercise sessions. Add 2 additional days to  program exercise sessions.     Initial Home Exercises Provided 12/04/22 12/04/22 12/04/22       Oxygen   Maintain Oxygen Saturation -- 88% or higher 88% or higher              Exercise Comments:   Exercise Comments     Row Name 10/07/22 0955           Exercise Comments First full day of exercise!  Patient was oriented to gym and equipment including functions, settings, policies, and procedures.  Patient's individual exercise prescription and treatment plan were reviewed.  All starting workloads were established based on the results  of the 6 minute walk test done at initial orientation visit.  The plan for exercise progression was also introduced and progression will be customized based on patient's performance and goals.                Exercise Goals and Review:   Exercise Goals     Row Name 09/30/22 1102             Exercise Goals   Increase Physical Activity Yes       Intervention Provide advice, education, support and counseling about physical activity/exercise needs.;Develop an individualized exercise prescription for aerobic and resistive training based on initial evaluation findings, risk stratification, comorbidities and participant's personal goals.       Expected Outcomes Long Term: Exercising regularly at least 3-5 days a week.;Long Term: Add in home exercise to make exercise part of routine and to increase amount of physical activity.;Short Term: Attend rehab on a regular basis to increase amount of physical activity.       Increase Strength and Stamina Yes       Intervention Provide advice, education, support and counseling about physical activity/exercise needs.;Develop an individualized exercise prescription for aerobic and resistive training based on initial evaluation findings, risk stratification, comorbidities and participant's personal goals.       Expected Outcomes Short Term: Increase workloads from initial exercise prescription for resistance, speed, and METs.;Short Term: Perform resistance training exercises routinely during rehab and add in resistance training at home;Long Term: Improve cardiorespiratory fitness, muscular endurance and strength as measured by increased METs and functional capacity (6MWT)       Able to understand and use rate of perceived exertion (RPE) scale Yes       Intervention Provide education and explanation on how to use RPE scale       Expected Outcomes Short Term: Able to use RPE daily in rehab to express subjective intensity level;Long Term:  Able to use RPE to guide  intensity level when exercising independently       Able to understand and use Dyspnea scale Yes       Intervention Provide education and explanation on how to use Dyspnea scale       Expected Outcomes Short Term: Able to use Dyspnea scale daily in rehab to express subjective sense of shortness of breath during exertion;Long Term: Able to use Dyspnea scale to guide intensity level when exercising independently       Knowledge and understanding of Target Heart Rate Range (THRR) Yes       Intervention Provide education and explanation of THRR including how the numbers were predicted and where they are located for reference       Expected Outcomes Short Term: Able to state/look up THRR;Short Term: Able to use daily as guideline for intensity in rehab;Long Term: Able to use THRR to  govern intensity when exercising independently       Able to check pulse independently Yes       Intervention Provide education and demonstration on how to check pulse in carotid and radial arteries.;Review the importance of being able to check your own pulse for safety during independent exercise       Expected Outcomes Short Term: Able to explain why pulse checking is important during independent exercise;Long Term: Able to check pulse independently and accurately       Understanding of Exercise Prescription Yes       Intervention Provide education, explanation, and written materials on patient's individual exercise prescription       Expected Outcomes Short Term: Able to explain program exercise prescription;Long Term: Able to explain home exercise prescription to exercise independently                Exercise Goals Re-Evaluation :  Exercise Goals Re-Evaluation     Row Name 10/07/22 0955 10/22/22 1426 11/04/22 1152 11/18/22 1525 12/02/22 1203     Exercise Goal Re-Evaluation   Exercise Goals Review Able to understand and use rate of perceived exertion (RPE) scale;Able to understand and use Dyspnea scale;Knowledge  and understanding of Target Heart Rate Range (THRR);Understanding of Exercise Prescription Increase Physical Activity;Increase Strength and Stamina;Understanding of Exercise Prescription Increase Physical Activity;Increase Strength and Stamina;Understanding of Exercise Prescription Increase Physical Activity;Increase Strength and Stamina;Understanding of Exercise Prescription Increase Physical Activity;Increase Strength and Stamina;Understanding of Exercise Prescription   Comments Reviewed RPE scale, THR and program prescription with pt today.  Pt voiced understanding and was given a copy of goals to take home. Lauren Escobar is off to a good start in rehab. She was able to work at an average MET level of 2 METs during her first two weeks in rehab. She also was able to walk up to 32 laps on the track. She was able to follow her initial exercise prescription on the seated machines as well. We will continue to monitor her progress in the program. Lauren Escobar continues to do well in rehab, however, she missed a few sessions as she was in the hospital for the flu. She started to ease back into her exercise and tried out the T5 at level 3. She also went down on her Biostep level but will remind patient to increase it gradually over time. She is not quite hitting her THR but RPEs are appropriate and dyspneas but reaching up to a 3. We will continue to monitor as she starts to feel better. Lauren Escobar is doing well in rehab. She recently increased her overall MET level to 2.33 METs. She also improved to level 4 on the T4 and level 2.1 on the arm crank. She did walk a few less laps since the last review going from 32 laps to 28 laps. We will continue to monitor her progress in the program. Lauren Escobar is doing well in rehab. she has not walked this last review and has remained on seated machines including the arm ergometer. She increased to level 4 on the Biostep and completed that for the full 30 minutes. Her dyspnea was no less than a 1, when  before is averaged 2-3! Her oxygen saturations are staying well above 88%. Will continue to monitor.   Expected Outcomes Short: Use RPE daily to regulate intensity. Long: Follow program prescription in THR. Short: Continue to follow current exercise prescription. Long: Continue to improve strength and stamina. Short: Increase Biostep level, ease back into walking Long: Continue to  increase overall MEt level Short: Push for more laps on the track. Long: Continue to improve strength and stamina. Short: Encourage more walking into exercise sessions Long: Continue to increase overall MET level and stamina    Row Name 12/04/22 0930 12/16/22 1431 12/30/22 1330 01/13/23 1451 01/27/23 1157     Exercise Goal Re-Evaluation   Exercise Goals Review Increase Physical Activity;Increase Strength and Stamina;Understanding of Exercise Prescription;Able to understand and use rate of perceived exertion (RPE) scale;Knowledge and understanding of Target Heart Rate Range (THRR);Able to understand and use Dyspnea scale;Able to check pulse independently Increase Physical Activity;Increase Strength and Stamina;Understanding of Exercise Prescription Increase Physical Activity;Increase Strength and Stamina;Understanding of Exercise Prescription Increase Physical Activity;Increase Strength and Stamina;Understanding of Exercise Prescription Increase Physical Activity;Increase Strength and Stamina;Understanding of Exercise Prescription   Comments Reviewed home exercise with pt today.  Pt plans to walk in house and use staff videos at home for exercise.  Reviewed THR, pulse, RPE, sign and symptoms, pulse oximetery and when to call 911 or MD.  Also discussed weather considerations and indoor options.  Pt voiced understanding.  Lauren Escobar is doing well in rehab. Her strength is getting a little better.  She is still struggling with aches and pains as well as muscle cramps in her legs. Lauren Escobar is doing well in rehab. She has consistently worked at  a MET level above 2 METs. However, she has decreased her workloads on the T4 Nustep and arm crank. She was also up to 32 laps on the track but has recently walked 15 and 22 laps. We will encourage Lauren Escobar to continue to progressively increase her workloads back up. We will continue to monitor her progress in the program. Lauren Escobar has been out of rehab for several weeks and recently returned back on 2/28. She had a flare up with her bronchitis and was informed by her MD that she can start exercise again. She eased back into exercise and still walked 15 laps on the track and level 3 on the T4 Nustep. Will continue to monitor. Lauren Escobar has not attended rehab since the last review. She returned to rehab for three sessions before having difficulty with her bronchitis again. We will continue to stay in contact with patient to see when she is ready to return to rehab. We will continue to monitor her progress. Lauren Escobar has not been to rehab since 3/4. She has been out sick. Called patient to receive update and she was told by her MD that she needs to pause rehab at this time due to having higher HR and BP - unsure for how long. Staff will contact doctor office to find out plan, and potentially discharge patient pending on what MD says.   Expected Outcomes Short: Start to add in exercsie at home Long: conitnue to improve stamina Short: Continue to increase workloads back up to previous levels. Long: Continue to improve strength and stamina. Short: Ease back into exercise regiment Long: Continue to increase overall MET level and strength Short: Return to regular attendance in the program. Long: Continue to improve stamina. Short: Receive update from doctor Long: Finish program if appropriate to come back    Row Name 02/10/23 1412             Exercise Goal Re-Evaluation   Comments Lauren Escobar has not been to rehab since 3/4. We spoke to doctor who did not state a time frame of stopping the program. Called patient to attempt to  follow up x2 and no response back. We  have sent her a discharge letter at this time.       Expected Outcomes Short: Return to rehab if appropriate. Long: Finish program if appropriate to return.                Discharge Exercise Prescription (Final Exercise Prescription Changes):  Exercise Prescription Changes - 12/30/22 1300       Response to Exercise   Blood Pressure (Admit) 116/60    Blood Pressure (Exit) 110/64    Heart Rate (Admit) 88 bpm    Heart Rate (Exercise) 111 bpm    Heart Rate (Exit) 95 bpm    Oxygen Saturation (Admit) 98 %    Oxygen Saturation (Exercise) 95 %    Oxygen Saturation (Exit) 98 %    Rating of Perceived Exertion (Exercise) 15    Perceived Dyspnea (Exercise) 2    Symptoms SOB    Comments return back from 2/9    Duration Continue with 30 min of aerobic exercise without signs/symptoms of physical distress.    Intensity THRR unchanged      Progression   Progression Continue to progress workloads to maintain intensity without signs/symptoms of physical distress.    Average METs 2.04      Resistance Training   Training Prescription Yes    Weight 3 lb    Reps 10-15      Interval Training   Interval Training No      NuStep   Level 3    Minutes 30    METs 2.3      Arm Ergometer   Level 1    Minutes 15    METs 2      Track   Laps 15    Minutes 15    METs 1.82      Home Exercise Plan   Plans to continue exercise at Home (comment)   walking, staff videos   Frequency Add 2 additional days to program exercise sessions.    Initial Home Exercises Provided 12/04/22      Oxygen   Maintain Oxygen Saturation 88% or higher             Nutrition:  Target Goals: Understanding of nutrition guidelines, daily intake of sodium 1500mg , cholesterol 200mg , calories 30% from fat and 7% or less from saturated fats, daily to have 5 or more servings of fruits and vegetables.  Education: All About Nutrition: -Group instruction provided by verbal,  written material, interactive activities, discussions, models, and posters to present general guidelines for heart healthy nutrition including fat, fiber, MyPlate, the role of sodium in heart healthy nutrition, utilization of the nutrition label, and utilization of this knowledge for meal planning. Follow up email sent as well. Written material given at graduation.   Biometrics:  Pre Biometrics - 09/30/22 1104       Pre Biometrics   Height 5' 2.1" (1.577 m)    Weight 169 lb (76.7 kg)    Waist Circumference 38 inches    Hip Circumference 40 inches    Waist to Hip Ratio 0.95 %    BMI (Calculated) 30.82    Single Leg Stand 0.9 seconds              Nutrition Therapy Plan and Nutrition Goals:  Nutrition Therapy & Goals - 09/30/22 0950       Nutrition Therapy   Diet Heart healthy, low Na, pulmonary and T2DM MNT    Protein (specify units) 90-100g    Fiber 25 grams  Whole Grain Foods 3 servings    Saturated Fats 14 max. grams    Fruits and Vegetables 8 servings/day    Sodium 2 grams      Personal Nutrition Goals   Nutrition Goal ST: include protein, fiber, and healthy fats when eating. Consider adding in nutrition shake such as ensure or boost. Add at least 2 meals/snacks LT: meet energy and protein needs, include at least 25g of fiber per day, limit Na <2g/day, limit saturated fat <14g/day, maintain A1C <7.    Comments 57 y.o. F admitted to pulmonary rehab for centrilobular emphysema. PMHx includes HTN, HLD, T2DM, arthritis, asthma, ataxia, COPD, depression, migraines, GERD, early satiety (2018). Relevant medications includes valium, nexium, prednisone, xanax, vyvanse, breo ellipta. Verneal does not have teeth or dentures at this time and reports that she is unable to get them as she went too long without them - she reports being able to eat most foods, reviewed considerations to help with eating and swallowing. Lauren Escobar reports having a very low appetite and forces herself to eat 1  small meal (about a fistfull of food). She reports that she is unsure why, but her weight has been trending upward: discussed body compensatory mechanisms with low intake of food as well as fluid retention. Lauren Escobar reports drinking coffee with french vanilla creamer and regular mountain dew during the day, she does not like diet soda and feels that water makes her bloat - discussed nutrition considerations with sugar sweetened beverages. Lauren Escobar reports that this is new to her and doesn't have much knowledge on nutrition - discussed heart healthy eating, pulmonary MNT, and T2DM MNT; discussed while MyPlate guidelines may be hard with her small portions, that she should include fiber, healthy fat, and protein when she eats and reviewed foods to include and consider in each group. Discussed mechanical eating (eating outside of hunger) by adding in at least 2 more meals/snacks as well as consider adding in a nutritional shake such as ensure of boost at least 1x/day to help meet her needs while she includes more meals/snacks to help fill gaps in nutrition and to ensure she is meeting her nutritional needs. Lauren Escobar is open to making changes, but reports her biggest barrier will be the mechanical eating of foods - encouraged her that making changes can take time and it is about progress, not perfection.      Intervention Plan   Intervention Prescribe, educate and counsel regarding individualized specific dietary modifications aiming towards targeted core components such as weight, hypertension, lipid management, diabetes, heart failure and other comorbidities.;Nutrition handout(s) given to patient.    Expected Outcomes Short Term Goal: Understand basic principles of dietary content, such as calories, fat, sodium, cholesterol and nutrients.;Short Term Goal: A plan has been developed with personal nutrition goals set during dietitian appointment.;Long Term Goal: Adherence to prescribed nutrition plan.              Nutrition Assessments:  MEDIFICTS Score Key: ?70 Need to make dietary changes  40-70 Heart Healthy Diet ? 40 Therapeutic Level Cholesterol Diet  Flowsheet Row Pulmonary Rehab from 09/30/2022 in [facility]  Picture Your Plate Total Score on Admission 55      Picture Your Plate Scores: <16 Unhealthy dietary pattern with much room for improvement. 41-50 Dietary pattern unlikely to meet recommendations for good health and room for improvement. 51-60 More healthful dietary pattern, with some room for improvement.  >60 Healthy dietary pattern, although there may be some specific behaviors that  could be improved.   Nutrition Goals Re-Evaluation:  Nutrition Goals Re-Evaluation     Row Name 11/01/22 0941 12/04/22 0941           Goals   Current Weight 162 lb (73.5 kg) --      Nutrition Goal Eat more foods. Short: Choose and plan snacks accordingly to patients caloric intake to improve breathing. Long: Maintain a diet independently that meets their caloric intake to aid in daily shortness of breath.      Comment Lauren Escobar states that her appetite is not good and sometimes goes days without eating.Patient was informed on why it is important to maintain a balanced diet when dealing with Respiratory issues. Explained that it takes a lot of energy to breath and when they are short of breath often they will need to have a good diet to help keep up with the calories they are expending for breathing. Lauren Escobar is doing well in rehab. She has no appetite.  Her boyfriend forces her to eat but it will sometimes make her feel sick to her stomach.  We talked about the need to make sure she is doing mechanical eating to [facility] caloric need.  We also talked about tyring protein shakes or meal replacement shakes.      Expected Outcome Short: Choose and plan snacks accordingly to patients caloric intake to improve breathing. Long: Maintain a diet independently that meets their caloric  intake to aid in daily shortness of breath. Short: Try mechanical eating/shakes Long: conitnue to eat regularly               Nutrition Goals Discharge (Final Nutrition Goals Re-Evaluation):  Nutrition Goals Re-Evaluation - 12/04/22 0941       Goals   Nutrition Goal Short: Choose and plan snacks accordingly to patients caloric intake to improve breathing. Long: Maintain a diet independently that meets their caloric intake to aid in daily shortness of breath.    Comment Javiana is doing well in rehab. She has no appetite.  Her boyfriend forces her to eat but it will sometimes make her feel sick to her stomach.  We talked about the need to make sure she is doing mechanical eating to [facility] caloric need.  We also talked about tyring protein shakes or meal replacement shakes.    Expected Outcome Short: Try mechanical eating/shakes Long: conitnue to eat regularly             Psychosocial: Target Goals: Acknowledge presence or absence of significant depression and/or stress, maximize coping skills, provide positive support system. Participant is able to verbalize types and ability to use techniques and skills needed for reducing stress and depression.   Education: Stress, Anxiety, and Depression - Group verbal and visual presentation to define topics covered.  Reviews how body is impacted by stress, anxiety, and depression.  Also discusses healthy ways to reduce stress and to treat/manage anxiety and depression.  Written material given at graduation. Flowsheet Row Pulmonary Rehab from 12/25/2022 in [facility]  Date 10/30/22  Educator [facility]  Instruction Review Code 1- [employer] Understanding       Education: Sleep Hygiene -Provides group verbal and written instruction about how sleep can affect your health.  Define sleep hygiene, discuss sleep cycles and impact of sleep habits. Review good sleep hygiene tips.    Initial Review & Psychosocial Screening:  Initial  Psych Review & Screening - 09/23/22 1027       Initial Review   Current issues with  None Identified      Family Dynamics   Good Support System? Yes   daughter     Barriers   Psychosocial barriers to participate in program There are no identifiable barriers or psychosocial needs.      Screening Interventions   Interventions Encouraged to exercise;To provide support and resources with identified psychosocial needs;Provide feedback about the scores to participant    Expected Outcomes Short Term goal: Utilizing psychosocial counselor, staff and physician to assist with identification of specific Stressors or current issues interfering with healing process. Setting desired goal for each stressor or current issue identified.;Long Term Goal: Stressors or current issues are controlled or eliminated.;Short Term goal: Identification and review with participant of any Quality of Life or Depression concerns found by scoring the questionnaire.;Long Term goal: The participant improves quality of Life and PHQ9 Scores as seen by post scores and/or verbalization of changes             Quality of Life Scores:  Scores of 19 and below usually indicate a poorer quality of life in these areas.  A difference of  2-3 points is a clinically meaningful difference.  A difference of 2-3 points in the total score of the Quality of Life Index has been associated with significant improvement in overall quality of life, self-image, physical symptoms, and general health in studies assessing change in quality of life.  PHQ-9: Review Flowsheet  More data exists      12/30/2022 12/04/2022 11/01/2022 10/30/2022 09/30/2022  Depression screen PHQ 2/9  Decreased Interest 3 0 3 0 0  Down, Depressed, Hopeless 3 3 3  0 0  PHQ - 2 Score 6 3 6  0 0  Altered sleeping 3 3 3 3  0  Tired, decreased energy 3 3 3 3 3   Change in appetite 3 3 3 3 3   Feeling bad or failure about yourself  3 3 3 3 3   Trouble concentrating 3 0 3 3 3   Moving  slowly or fidgety/restless 3 3 3  0 0  Suicidal thoughts 0 0 0 0 0  PHQ-9 Score 24 18 24 15 12   Difficult doing work/chores Very difficult Extremely dIfficult Extremely dIfficult Somewhat difficult Extremely dIfficult   Interpretation of Total Score  Total Score Depression Severity:  1-4 = Minimal depression, 5-9 = Mild depression, 10-14 = Moderate depression, 15-19 = Moderately severe depression, 20-27 = Severe depression   Psychosocial Evaluation and Intervention:  Psychosocial Evaluation - 09/23/22 1037       Psychosocial Evaluation & Interventions   Interventions Encouraged to exercise with the program and follow exercise prescription    Comments Lauren Escobar has no barriers to attending the program. She hopes to see improvement in her shortness of breath symptoms with exertion. She lives alone. Her daughter is her support person.  She has been diagnosed with diabetes and is diet controlled at this moment. She has another A1C scheduled in Dec. She is ready to start the program and progress her exercise prescription, learn energy conservation and improve her stamina.    Expected Outcomes STG Lauren Escobar will attend all scheduled sessions, she will meet with our LDN to review safe ways to work on weight loss and help with diabetes control. She will begin an exercise progression.  LTG Gissela will continue to utilize the tips for exercise, breathing and risk factor control after discharge from the program.    Continue Psychosocial Services  Follow up required by staff  Psychosocial Re-Evaluation:  Psychosocial Re-Evaluation     Row Name 11/01/22 0935 12/04/22 0933 12/30/22 1610         Psychosocial Re-Evaluation   Current issues with Current Depression;Current Sleep Concerns;Current Anxiety/Panic;History of Depression;Current Stress Concerns Current Depression;Current Sleep Concerns;Current Anxiety/Panic;History of Depression;Current Stress Concerns Current Depression;Current Sleep  Concerns;Current Anxiety/Panic;History of Depression;Current Stress Concerns     Comments Reviewed patient health questionnaire (PHQ-9) with patient for follow up. Previously, patients score indicated signs/symptoms of depression.  Reviewed to see if patient is improving symptom wise while in program.  Score declined and patient states that it is because she is stressedx with her health. Her stressers are her kids and she issues with her daughter. Her daughter has three children and has taken over her house. She is paying the bills and not staying at her own home. Informed her to seek help with a therapist or the doctor for medication for her mental well being. Kinzlie is doing well in rehab. Her PHQ score did improve some.  A lot of her issues seem to stem from her lack of sleep.  She is only able to sleep for about an hour before she wakes up.  She has done a sleep study in the past, but it did not go well and she never went back for another.  We talked about how her lack of sleep is spilling out into the rest of life.  She was encouraged to contact her doctor about another sleep study.  She is working with her doctor to get in with a depression doctor.   She had one previously, but they lost their liscence.  She also has bladder disease and trying to find a good doctor.  Her mom had the same thing that turned into caner.  We looked up [facility] and gave her contact info. Seniya reports dealing with some anxiety mostly related to her health and family. Her PHQ score has increased from 18 to 24. She states that her sleep concerns are also related to her nerves and anxiety. She states that she was previously on anxiety medications but came off of that medication when the doctor who prescribed it quit practicing. We encouraged her to talk to her doctor about her lack of sleep and the possibility of going back on anxiety medication.     Expected Outcomes Short: talk to doctor about possible nmedication.  Long: maintain a positive mental state. Short: Talk to doctor about sleep and bladder Long: Continue to exercise for mental boost Short: Talk to doctor about sleep and anxiety. Long: Continue to exercise for mental boost     Interventions Encouraged to attend Pulmonary Rehabilitation for the exercise;Stress management education;Relaxation education Encouraged to attend Pulmonary Rehabilitation for the exercise;Stress management education;Relaxation education Encouraged to attend Pulmonary Rehabilitation for the exercise;Stress management education;Relaxation education     Continue Psychosocial Services  Follow up required by staff Follow up required by staff Follow up required by staff       Initial Review   Source of Stress Concerns -- -- Chronic Illness;Family              Psychosocial Discharge (Final Psychosocial Re-Evaluation):  Psychosocial Re-Evaluation - 12/30/22 0928       Psychosocial Re-Evaluation   Current issues with Current Depression;Current Sleep Concerns;Current Anxiety/Panic;History of Depression;Current Stress Concerns    Comments Lauren Escobar reports dealing with some anxiety mostly related to her health and family. Her PHQ score has increased from 18 to 24. She states  that her sleep concerns are also related to her nerves and anxiety. She states that she was previously on anxiety medications but came off of that medication when the doctor who prescribed it quit practicing. We encouraged her to talk to her doctor about her lack of sleep and the possibility of going back on anxiety medication.    Expected Outcomes Short: Talk to doctor about sleep and anxiety. Long: Continue to exercise for mental boost    Interventions Encouraged to attend Pulmonary Rehabilitation for the exercise;Stress management education;Relaxation education    Continue Psychosocial Services  Follow up required by staff      Initial Review   Source of Stress Concerns Chronic Illness;Family              Education: Education Goals: Education classes will be provided on a weekly basis, covering required topics. Participant will state understanding/return demonstration of topics presented.  Learning Barriers/Preferences:   General Pulmonary Education Topics:  Infection Prevention: - Provides verbal and written material to individual with discussion of infection control including proper hand washing and proper equipment cleaning during exercise session. Flowsheet Row Pulmonary Rehab from 12/25/2022 in [facility] Cardiac and Pulmonary Rehab  Date 09/30/22  Educator [facility]  Instruction Review Code 1- Verbalizes Understanding       Falls Prevention: - Provides verbal and written material to individual with discussion of falls prevention and safety. Flowsheet Row Pulmonary Rehab from 12/25/2022 in [facility] Cardiac and Pulmonary Rehab  Date 09/23/22  Educator SB  Instruction Review Code 1- Verbalizes Understanding       Chronic Lung Disease Review: - Group verbal instruction with posters, models, PowerPoint presentations and videos,  to review new updates, new respiratory medications, new advancements in procedures and treatments. Providing information on websites and "800" numbers for continued self-education. Includes information about supplement oxygen, available portable oxygen systems, continuous and intermittent flow rates, oxygen safety, concentrators, and Medicare reimbursement for oxygen. Explanation of Pulmonary Drugs, including class, frequency, complications, importance of spacers, rinsing mouth after steroid MDI's, and proper cleaning methods for nebulizers. Review of basic lung anatomy and physiology related to function, structure, and complications of lung disease. Review of risk factors. Discussion about methods for diagnosing sleep apnea and types of masks and machines for OSA. Includes a review of the use of types of environmental controls: home humidity, furnaces, filters, dust mite/pet  prevention, HEPA vacuums. Discussion about weather changes, air quality and the benefits of nasal washing. Instruction on Warning signs, infection symptoms, calling MD promptly, preventive modes, and value of vaccinations. Review of effective airway clearance, coughing and/or vibration techniques. Emphasizing that all should Create an Action Plan. Written material given at graduation. Flowsheet Row Pulmonary Rehab from 12/25/2022 in [facility] Cardiac and Pulmonary Rehab  Education need identified 09/30/22       AED/CPR: - Group verbal and written instruction with the use of models to demonstrate the basic use of the AED with the basic ABC's of resuscitation.    Anatomy and Cardiac Procedures: - Group verbal and visual presentation and models provide information about basic cardiac anatomy and function. Reviews the testing methods done to diagnose heart disease and the outcomes of the test results. Describes the treatment choices: Medical Management, Angioplasty, or Coronary Bypass Surgery for treating various heart conditions including Myocardial Infarction, Angina, Valve Disease, and Cardiac Arrhythmias.  Written material given at graduation. Flowsheet Row Pulmonary Rehab from 12/25/2022 in [facility] Cardiac and Pulmonary Rehab  Date 12/04/22  Educator SB  Instruction Review Code 1-  Verbalizes Understanding       Medication Safety: - Group verbal and visual instruction to review commonly prescribed medications for heart and lung disease. Reviews the medication, class of the drug, and side effects. Includes the steps to properly store meds and maintain the prescription regimen.  Written material given at graduation. Flowsheet Row Pulmonary Rehab from 12/25/2022 in [facility] Cardiac and Pulmonary Rehab  Date 10/09/22  Educator SB  Instruction Review Code 1- Verbalizes Understanding       Other: -Provides group and verbal instruction on various topics (see comments)   Knowledge Questionnaire Score:   Knowledge Questionnaire Score - 09/30/22 1107       Knowledge Questionnaire Score   Pre Score 16/18              Core Components/Risk Factors/Patient Goals at Admission:  Personal Goals and Risk Factors at Admission - 09/30/22 1107       Core Components/Risk Factors/Patient Goals on Admission    Weight Management Yes;Obesity;Weight Loss    Intervention Weight Management: Develop a combined nutrition and exercise program designed to reach desired caloric intake, while maintaining appropriate intake of nutrient and fiber, sodium and fats, and appropriate energy expenditure required for the weight goal.;Weight Management: Provide education and appropriate resources to help participant work on and attain dietary goals.;Weight Management/Obesity: Establish reasonable short term and long term weight goals.;Obesity: Provide education and appropriate resources to help participant work on and attain dietary goals.    Admit Weight 169 lb (76.7 kg)    Goal Weight: Short Term 165 lb (74.8 kg)    Goal Weight: Long Term 130 lb (59 kg)    Expected Outcomes Short Term: Continue to assess and modify interventions until short term weight is achieved;Long Term: Adherence to nutrition and physical activity/exercise program aimed toward attainment of established weight goal;Weight Loss: Understanding of general recommendations for a balanced deficit meal plan, which promotes 1-2 lb weight loss per week and includes a negative energy balance of [phone] kcal/d;Understanding recommendations for meals to include 15-35% energy as protein, 25-35% energy from fat, 35-60% energy from carbohydrates, less than 200mg  of dietary cholesterol, 20-35 gm of total fiber daily;Understanding of distribution of calorie intake throughout the day with the consumption of 4-5 meals/snacks    Improve shortness of breath with ADL's Yes    Intervention Provide education, individualized exercise plan and daily activity instruction to help  decrease symptoms of SOB with activities of daily living.    Expected Outcomes Short Term: Improve cardiorespiratory fitness to achieve a reduction of symptoms when performing ADLs;Long Term: Be able to perform more ADLs without symptoms or delay the onset of symptoms    Increase knowledge of respiratory medications and ability to use respiratory devices properly  Yes    Intervention Provide education and demonstration as needed of appropriate use of medications, inhalers, and oxygen therapy.    Expected Outcomes Short Term: Achieves understanding of medications use. Understands that oxygen is a medication prescribed by physician. Demonstrates appropriate use of inhaler and oxygen therapy.;Long Term: Maintain appropriate use of medications, inhalers, and oxygen therapy.    Diabetes Yes    Intervention Provide education about signs/symptoms and action to take for hypo/hyperglycemia.;Provide education about proper nutrition, including hydration, and aerobic/resistive exercise prescription along with prescribed medications to achieve blood glucose in normal ranges: Fasting glucose 65-99 mg/dL    Expected Outcomes Short Term: Participant verbalizes understanding of the signs/symptoms and immediate care of hyper/hypoglycemia, proper foot care and importance of medication, aerobic/resistive  exercise and nutrition plan for blood glucose control.;Long Term: Attainment of HbA1C < 7%.    Hypertension Yes    Intervention Provide education on lifestyle modifcations including regular physical activity/exercise, weight management, moderate sodium restriction and increased consumption of fresh fruit, vegetables, and low fat dairy, alcohol moderation, and smoking cessation.;Monitor prescription use compliance.    Expected Outcomes Short Term: Continued assessment and intervention until BP is < 140/67mm HG in hypertensive participants. < 130/23mm HG in hypertensive participants with diabetes, heart failure or chronic  kidney disease.;Long Term: Maintenance of blood pressure at goal levels.             Education:Diabetes - Individual verbal and written instruction to review signs/symptoms of diabetes, desired ranges of glucose level fasting, after meals and with exercise. Acknowledge that pre and post exercise glucose checks will be done for 3 sessions at entry of program. Flowsheet Row Pulmonary Rehab from 12/25/2022 in [facility] Cardiac and Pulmonary Rehab  Date 09/30/22  Educator [facility]  Instruction Review Code 1- Verbalizes Understanding       Know Your Numbers and Heart Failure: - Group verbal and visual instruction to discuss disease risk factors for cardiac and pulmonary disease and treatment options.  Reviews associated critical values for Overweight/Obesity, Hypertension, Cholesterol, and Diabetes.  Discusses basics of heart failure: signs/symptoms and treatments.  Introduces Heart Failure Zone chart for action plan for heart failure.  Written material given at graduation. Flowsheet Row Pulmonary Rehab from 12/25/2022 in [facility] Cardiac and Pulmonary Rehab  Date 12/25/22  Educator [facility]  Instruction Review Code 1- Verbalizes Understanding       Core Components/Risk Factors/Patient Goals Review:   Goals and Risk Factor Review     Row Name 11/01/22 [phone] 12/04/22 0944           Core Components/Risk Factors/Patient Goals Review   Personal Goals Review Improve shortness of breath with ADL's Improve shortness of breath with ADL's;Weight Management/Obesity;Increase knowledge of respiratory medications and ability to use respiratory devices properly.;Hypertension      Review Spoke to patient about their shortness of breath and what they can do to improve. Patient has been informed of breathing techniques when starting the program. Patient is informed to tell staff if they have had any med changes and that certain meds they are taking or not taking can be causing shortness of breath. Lauren Escobar is doing well in  rehab.  Her weight is maintain despite not eating.  Since her mom passed, she gained weight but previously could breathe better so she would like to lose again.  She still is struggling with her breathing and it limits how much she is able to do each day.  She uses her inhaler daily and would like to get back to using a nubulizer, but need her doctor to order one.  Her pressures are good in class, but she does not check them at home.      Expected Outcomes Short: Attend [facility] regularly to improve shortness of breath with ADL's. Long: maintain independence with ADL's Short: Talk to doctor about nebulizers Long: COnitnue to work manage lung disease               Core Components/Risk Factors/Patient Goals at Discharge (Final Review):   Goals and Risk Factor Review - 12/04/22 0944       Core Components/Risk Factors/Patient Goals Review   Personal Goals Review Improve shortness of breath with ADL's;Weight Management/Obesity;Increase knowledge of respiratory medications and ability to use respiratory devices properly.;Hypertension  Review Lauren Escobar is doing well in rehab.  Her weight is maintain despite not eating.  Since her mom passed, she gained weight but previously could breathe better so she would like to lose again.  She still is struggling with her breathing and it limits how much she is able to do each day.  She uses her inhaler daily and would like to get back to using a nubulizer, but need her doctor to order one.  Her pressures are good in class, but she does not check them at home.    Expected Outcomes Short: Talk to doctor about nebulizers Long: COnitnue to work manage lung disease             ITP Comments:  ITP Comments     Row Name 09/23/22 1048 09/30/22 1058 10/07/22 0955 10/23/22 1034 11/20/22 0956   ITP Comments Virtual orientation call completed today. shehas an appointment on Date: 09/30/2022  for EP eval and gym Orientation.  Documentation of diagnosis can be found in  [facility] Date: 09/11/2022 . Completed and gym orientation. Initial ITP created and sent for review to Dr. Jinny Sanders, Medical Director. First full day of exercise!  Patient was oriented to gym and equipment including functions, settings, policies, and procedures.  Patient's individual exercise prescription and treatment plan were reviewed.  All starting workloads were established based on the results of the 6 minute walk test done at initial orientation visit.  The plan for exercise progression was also introduced and progression will be customized based on patient's performance and goals. 30 Day review completed. Medical Director ITP review done, changes made as directed, and signed approval by Medical Director.     new to prgram 30 Day review completed. Medical Director ITP review done, changes made as directed, and signed approval by Medical Director.    Row Name 12/18/22 1424 12/18/22 1707 01/07/23 1546 01/15/23 0914 01/27/23 1159   ITP Comments 30 day review completed. ITP sent to Dr. Jinny Sanders, Medical Director of  Pulmonary Rehab. Continue with ITP unless changes are made by physician. Called patient to check in on patient who has been feeling sick. Has not attended rehab since 2/9. She was told she had a flare up with bronchitis. She states she spoke with her doctor who told her to stay out of exercise until 2/28. She was given new inhalers. Patient will call us if she still does not feel good to come back next week. Called to check on patient.  She has been out with bronchitis.  However, she has also been having nose bleeds for the past four days.  She was seen by hematology last week and was encouraged to call them again to let them know about nose bleeds. We will take her out for the week to recover from bronchitis and she will let us know about next week.  Unable to assess for goals this term. 30 Day review completed. Medical Director ITP review done, changes made as directed, and signed  approval by Medical Director.   remains out with medical concern Skylene has not been to rehab since 3/4. She has been out sick. Called patient to receive update and she was told by her MD that she needs to pause rehab at this time due to having higher HR and BP - unsure for how long. Staff will contact doctor office to find out plan, and potentially discharge patient pending on what MD says. Will take out appts the next couple of weeks at  this time.    Row Name 01/27/23 1534 02/03/23 1325 02/12/23 1329 02/17/23 1223     ITP Comments Called Dr. Larrie Kass office to receive clarifcation. Dr. Hessie Diener did not state a timeframe of stopping program. Called patient to attempt to follow up x2 and no response back. Will send letter at this time. HAs not attended rehab since 3/4. 30 day review completed. ITP sent to Dr. Jinny Sanders, Medical Director of  Pulmonary Rehab. Continue with ITP unless changes are made by physician.  No goal review as she has not attended since 12/30/22. Sent letter to patient with no response back. (provided 2.5 week heads up)  have not heard from patient. Will discharge at this time. Patient has not attended since 12/30/22.             Comments: Discharge ITP

## 2023-02-17 NOTE — Progress Notes (Signed)
Discharge Progress Report  Patient Details  Name: Lauren Escobar MRN: [id number] Date of Birth: [birth year] Referring Provider:   Flowsheet Row Pulmonary Rehab from 09/30/2022 in [facility] Cardiac and Pulmonary Rehab  Referring Provider Raechel Chute MD        Number of Visits: 22  Reason for Discharge:  Early Exit:  Lack of attendance/ Medical  Smoking History:  Social History   Tobacco Use  Smoking Status Former   Packs/day: 2.75   Years: 29.00   Additional pack years: 0.00   Total pack years: 79.75   Types: Cigarettes   Quit date: 10/2020   Years since quitting: 2.3  Smokeless Tobacco Never    Diagnosis:  Centrilobular emphysema  Shortness of breath    6 Minute Walk     Row Name 09/30/22 1058         6 Minute Walk   Phase Initial     Distance 1315 feet     Walk Time 6 minutes     # of Rest Breaks 0     MPH 2.49     METS 3.55     RPE 15     Perceived Dyspnea  3     VO2 Peak 12.42     Symptoms Yes (comment)     Comments SOB, Fatigue     Resting HR 83 bpm     Resting BP 124/66     Resting Oxygen Saturation  96 %     Exercise Oxygen Saturation  during 6 min walk 95 %     Max Ex. HR 106 bpm     Max Ex. BP 146/74     2 Minute Post BP 136/72       Interval HR   1 Minute HR 104     2 Minute HR 104     3 Minute HR 103     4 Minute HR 106     5 Minute HR 101     6 Minute HR 101     2 Minute Post HR 82     Interval Heart Rate? Yes       Interval Oxygen   Interval Oxygen? Yes     Baseline Oxygen Saturation % 96 %     1 Minute Oxygen Saturation % 95 %     1 Minute Liters of Oxygen 0 L  Room Air     2 Minute Oxygen Saturation % 96 %     2 Minute Liters of Oxygen 0 L     3 Minute Oxygen Saturation % 96 %     3 Minute Liters of Oxygen 0 L     4 Minute Oxygen Saturation % 96 %     4 Minute Liters of Oxygen 0 L     5 Minute Oxygen Saturation % 95 %     5 Minute Liters of Oxygen 0 L     6 Minute Oxygen Saturation % 96 %     6 Minute Liters of  Oxygen 0 L     2 Minute Post Oxygen Saturation % 96 %     2 Minute Post Liters of Oxygen 0 L                 Nutrition & Weight - Outcomes:  Pre Biometrics - 09/30/22 1104       Pre Biometrics   Height 5' 2.1" (1.577 m)    Weight 169 lb (76.7 kg)    Waist Circumference  38 inches    Hip Circumference 40 inches    Waist to Hip Ratio 0.95 %    BMI (Calculated) 30.82    Single Leg Stand 0.9 seconds

## 2023-02-18 MED FILL — JAKAFI 15 MG TABLET: ORAL | 30 days supply | Qty: 60 | Fill #6

## 2023-03-03 NOTE — Unmapped (Signed)
Pt is requesting refill    Most recent clinic visit: 01/01/2023  Next clinic visit:  04/29/2023

## 2023-03-04 MED ORDER — FERROUS SULFATE 325 MG (65 MG IRON) TABLET
ORAL_TABLET | ORAL | 3 refills | 53 days | Status: CP
Start: 2023-03-04 — End: 2023-09-30

## 2023-03-12 NOTE — Unmapped (Signed)
[facility] Refill Coordination Note    Specialty Medication(s) to be Shipped:   Hematology/Oncology: [name]    Other medication(s) to be shipped: No additional medications requested for fill at this time     Courtney Erickson, DOB: September 20, 1966  Phone: [phone] (home)       All above HIPAA information was verified with patient.     Was a Nurse, learning disability used for this call? No    Completed refill call assessment today to schedule patient's medication shipment from the [facility] [phone]).  All relevant notes have been reviewed.     Specialty medication(s) and dose(s) confirmed: Regimen is correct and unchanged.   Changes to medications: [name] reports no changes at this time.  Changes to insurance: No  New side effects reported not previously addressed with a pharmacist or physician: None reported  Questions for the pharmacist: No    Confirmed patient received a Conservation officer, historic buildings and a Surveyor, mining with first shipment. The patient will receive a drug information handout for each medication shipped and additional FDA Medication Guides as required.       DISEASE/MEDICATION-SPECIFIC INFORMATION        N/A    SPECIALTY MEDICATION ADHERENCE     Medication Adherence    Patient reported X missed doses in the last month: 0  Specialty Medication: [name]  Patient is on additional specialty medications: No  Informant: patient     Were doses missed due to medication being on hold? No    Jakafi 15mg : 14 days of medicine on hand       REFERRAL TO PHARMACIST     Referral to the pharmacist: Not needed      [facility]     Shipping address confirmed in Epic.     Delivery Scheduled: Yes, Expected medication delivery date: 03/20/23.     Medication will be delivered via Next Day Courier to the prescription address in Epic [place].    Courtney Erickson   [facility] Specialty Technician

## 2023-03-19 MED FILL — JAKAFI 15 MG TABLET: ORAL | 30 days supply | Qty: 60 | Fill #7

## 2023-04-02 ENCOUNTER — Ambulatory Visit (INDEPENDENT_AMBULATORY_CARE_PROVIDER_SITE_OTHER): Payer: 59 | Admitting: Student in an Organized Health Care Education/Training Program

## 2023-04-02 ENCOUNTER — Encounter: Payer: Self-pay | Admitting: Student in an Organized Health Care Education/Training Program

## 2023-04-02 VITALS — BP 122/78 | HR 105 | Temp 97.7°F | Ht 59.0 in | Wt 155.8 lb

## 2023-04-02 DIAGNOSIS — J432 Centrilobular emphysema: Secondary | ICD-10-CM | POA: Diagnosis not present

## 2023-04-02 DIAGNOSIS — R0602 Shortness of breath: Secondary | ICD-10-CM | POA: Diagnosis not present

## 2023-04-02 NOTE — Progress Notes (Signed)
Synopsis: Referred in for COPD by Oswaldo Conroy, MD  Assessment & Plan:   #Shortness of breath #COPD (preserved ratio impaired spirometry) #Emphysema  Presents for the evaluation of shortness of breath with PFT's from 06/2022 showing an FEV1 of 1.5L (68%) post bronchodilator challenge with preserved ratio (FEV1/FVC of 0.86). She has normal TLC and signs of air trapping (RV/TLC of 142% predicted). DLCO is normal at 81% predicted. Lung cancer screening CT's also shows emphysema .   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 Will continue triple LABA/LAMA/ICS therapy with Spiriva respimat and Breo Ellipta. I had placed a referral to pulmonary rehab during her last visit.   As to the tremors, no sign or suggestion of alcohol withdrawal or illicit drug use, and no signs to suggest serotonin syndrome. Blood pressure was within normal today, and blood glucose level was normal today with no hypoglycemia. I asked her to see her PCP for this.  Return in about 1 year (around 04/01/2024).  I spent 30 minutes caring for this patient today, including preparing to see the patient, obtaining a medical history , reviewing a separately obtained history, performing a medically appropriate examination and/or evaluation, counseling and educating the patient/family/caregiver, ordering medications, tests, or procedures, and documenting clinical information in the electronic health record  Raechel Chute, MD [facility] 04/02/2023 6:09 PM    End of visit medications:  No orders of the defined types were placed in this encounter.    Current Outpatient Medications:    albuterol (VENTOLIN HFA) 108 (90 Base) MCG/ACT inhaler, Inhale into the lungs., Disp: , Rfl:    aspirin EC 81 MG tablet, Take by mouth., Disp: , Rfl:    atorvastatin (LIPITOR) 20 MG tablet, TAKE 1 TABLET BY MOUTH AT BEDTIME FOR HIGH CHOLESTEROL, Disp: , Rfl:    cetirizine (ZYRTEC) 10 MG tablet,  TAKE 1 TABLET BY MOUTH DAILY FOR ALLERGIES, Disp: , Rfl:    dextromethorphan-guaiFENesin (MUCINEX DM) 30-600 MG 12hr tablet, Take 1 tablet by mouth 2 (two) times daily as needed for cough., Disp: 14 tablet, Rfl: 0   esomeprazole (NEXIUM) 40 MG capsule, Take 40 mg by mouth daily. , Disp: , Rfl:    fluticasone furoate-vilanterol (BREO ELLIPTA) 100-25 MCG/ACT AEPB, Inhale 1 puff into the lungs daily., Disp: 30 each, Rfl: 11   folic acid (FOLVITE) 1 MG tablet, Take 1 mg by mouth daily., Disp: , Rfl:    isosorbide mononitrate (IMDUR) 30 MG 24 hr tablet, Take 30 mg by mouth daily., Disp: , Rfl:    Multiple Vitamins-Minerals (MULTIVIT/MULTIMINERAL ADULT PO), Take by mouth., Disp: , Rfl:    NITROGLYCERIN SL, Place 1 tablet under the tongue., Disp: , Rfl:    promethazine (PHENERGAN) 12.5 MG tablet, Take 1 tablet (12.5 mg total) by mouth every 6 (six) hours as needed for nausea or vomiting., Disp: 20 tablet, Rfl: 0   propranolol (INDERAL) 40 MG tablet, TAKE 1 TABLET TWICE DAILY TO PREVENT HEADACHES, Disp: , Rfl:    REXULTI 0.5 MG TABS, TAKE 1 TABLET BY MOUTH AT BEDTIME FOR 1 WEEK THEN INCREASE TO 1 MG TABLET, Disp: 67 tablet, Rfl: 1   VRAYLAR 1.5 MG capsule, Take 1 capsule (1.5 mg total) by mouth daily., Disp: 30 capsule, Rfl: 3   ferrous sulfate 325 (65 FE) MG tablet, Take by mouth., Disp: , Rfl:    nicotine (NICODERM CQ - DOSED IN MG/24 HOURS) 21 mg/24hr patch, Place 1 patch (21 mg total) onto the  skin daily. (Patient not taking: Reported on 04/02/2023), Disp: 28 patch, Rfl: 0   risperiDONE (RISPERDAL) 1 MG tablet, Take 0.5 tablets (0.5 mg total) by mouth at bedtime for 7 days, THEN 1 tablet (1 mg total) at bedtime., Disp: 33.5 tablet, Rfl: 0   venlafaxine XR (EFFEXOR-XR) 150 MG 24 hr capsule, Take 2 capsules (300 mg total) by mouth every morning. (Patient not taking: Reported on 04/02/2023), Disp: 30 capsule, Rfl: 3   Subjective:   PATIENT ID: Lauren Escobar GENDER: female DOB: 1965/11/18, MRN:  [id number]  Chief Complaint  Patient presents with   Follow-up    SOB with exertion and dry cough.     HPI  57 year old female with a self-reported history of bronchitis who presents to clinic for follow up on shortness of breath.  Patient last seen by me in November of 2023. Today, she feels very anxious and appears tremulous. She reports the shakes started yesterday, and have persisted. She's not on any new medications, and categorically denies any alcohol or illicit drug use. She has no fevers, no stiffness, and no flushing. She feels her shortness of breath is stable and at baseline. There has been no change to symptoms otherwise.   Shortness of breath has been at rest and with exertion. This is limiting to her ability to walk short distances. She continues to have a dry cough. Her primary care provider prescribed her antibiotics and steroids in the past for management of presumed COPD. For her shortness of breath, she has been seen by cardiology and has had an evaluation and subsequently discharged from their care. She also underwent a pulmonary function test that was performed in our hospital.  She has had multiple inhalers over the years and there is some confusion over her inhaler regimen.  She is currently on spiriva respimat and breo ellipta. She has participated in pulmonary rehab.  She denies any fevers, chills, hemoptysis, productive sputum, night sweats, weight loss, abdominal pain, chest pain, or chest tightness.  She has gained weight over the past couple of years (between 15 and 20 pounds) and this has coincided with worsening symptoms.  Her other providers include hematology at [facility] where she is managed for polycythemia vera.  She is also getting yearly CT scans for lung cancer screening at [facility].  Review of the record does note that the CT mentions emphysema but otherwise normal lung parenchyma.   She used to work at [employer] but has not done so for many years and she is currently on  disability.  She used to smoke (3 packs a day for at least 20 years) but quit a year ago after her friend came down with cancer.  Ancillary information including prior medications, full medical/surgical/family/social histories, and PFTs (when available) are listed below and have been reviewed.   Review of Systems  Constitutional:  Negative for chills, fever and weight loss (weight gain).  Respiratory:  Positive for cough, sputum production and shortness of breath. Negative for hemoptysis and wheezing.   Cardiovascular:  Negative for chest pain.  Neurological:  Positive for tremors.     Objective:   Vitals:   04/02/23 1343  BP: 122/78  Pulse: (!) 105  Temp: 97.7 F (36.5 C)  TempSrc: Temporal  SpO2: 98%  Weight: 155 lb 12.8 oz (70.7 kg)  Height: 4\' 11"  (1.499 m)   98% on RA  BMI Readings from Last 3 Encounters:  04/02/23 31.47 kg/m  10/15/22 31.09 kg/m  09/30/22 30.81 kg/m  Wt Readings from Last 3 Encounters:  04/02/23 155 lb 12.8 oz (70.7 kg)  10/15/22 170 lb (77.1 kg)  09/30/22 169 lb (76.7 kg)    Physical Exam Constitutional:      Appearance: Normal appearance.  HENT:     Head: Normocephalic.     Mouth/Throat:     Mouth: Mucous membranes are moist.  Eyes:     Extraocular Movements: Extraocular movements intact.     Pupils: Pupils are equal, round, and reactive to light.  Cardiovascular:     Rate and Rhythm: Normal rate and regular rhythm.     Pulses: Normal pulses.     Heart sounds: Normal heart sounds.  Pulmonary:     Effort: Pulmonary effort is normal.     Breath sounds: Normal breath sounds. No wheezing.  Abdominal:     General: There is distension.     Palpations: Abdomen is soft.  Neurological:     General: No focal deficit present.     Mental Status: She is alert and oriented to person, place, and time. Mental status is at baseline.       Ancillary Information    Past Medical History:  Diagnosis Date   Abdominal pain    Abnormal  mammogram    Anxiety    Asthma    Bleeding disorder (HCC)    Blood dyscrasia    polycythemia vera   Chronic kidney disease    COPD (chronic obstructive pulmonary disease) (HCC)    Cough    Depression    Diabetes mellitus without complication (HCC)    Fatigue    Headache 2000   Horizontal nystagmus    age 89   Hyperglycemia    Hypertension    Low back pain    Major depressive disorder    Nasal lesion    Neck pain    Snoring    SOB (shortness of breath)    Tobacco use    UTI (urinary tract infection)    Von Willebrand's disease (HCC)      Family History  Problem Relation Age of Onset   Cancer Father        prostate   Stroke Father    Diabetes Father    Cancer Mother        bladder   Breast cancer Maternal Grandmother      Past Surgical History:  Procedure Laterality Date   ABDOMINAL HYSTERECTOMY  2002   oophorectomy   ABDOMINAL HYSTERECTOMY     APPENDECTOMY     BLADDER SURGERY     BREAST BIOPSY Left    NEG   CARDIAC CATHETERIZATION     CARDIAC CATHETERIZATION N/A 07/17/2016   Procedure: Left Heart Cath and Coronary Angiography;  Surgeon: Lamar Blinks, MD;  Location: [facility] INVASIVE CV LAB;  Service: Cardiovascular;  Laterality: N/A;   COLONOSCOPY WITH PROPOFOL N/A 07/27/2015   Procedure: COLONOSCOPY WITH PROPOFOL;  Surgeon: Scot Jun, MD;  Location: [facility] ENDOSCOPY;  Service: Endoscopy;  Laterality: N/A;   COLONOSCOPY WITH PROPOFOL N/A 05/31/2019   Procedure: COLONOSCOPY WITH PROPOFOL;  Surgeon: Toney Reil, MD;  Location: [facility] ENDOSCOPY;  Service: Gastroenterology;  Laterality: N/A;   ESOPHAGOGASTRODUODENOSCOPY N/A 06/18/2017   Procedure: ESOPHAGOGASTRODUODENOSCOPY (EGD);  Surgeon: Toney Reil, MD;  Location: [facility] ENDOSCOPY;  Service: Gastroenterology;  Laterality: N/A;   ESOPHAGOGASTRODUODENOSCOPY (EGD) WITH PROPOFOL N/A 05/31/2019   Procedure: ESOPHAGOGASTRODUODENOSCOPY (EGD) WITH PROPOFOL;  Surgeon: Toney Reil, MD;  Location: [facility]  ENDOSCOPY;  Service: Gastroenterology;  Laterality:  N/A;   REPLACEMENT TOTAL KNEE Left    SHOULDER SURGERY Left    x 2    Social History   Socioeconomic History   Marital status: Single    Spouse name: Not on file   Number of children: Not on file   Years of education: Not on file   Highest education level: Not on file  Occupational History    Comment: disabled  Tobacco Use   Smoking status: Former    Packs/day: 2.75    Years: 29.00    Additional pack years: 0.00    Total pack years: 79.75    Types: Cigarettes    Quit date: 10/2020    Years since quitting: 2.4   Smokeless tobacco: Never  Vaping Use   Vaping Use: Never used  Substance and Sexual Activity   Alcohol use: No    Alcohol/week: 0.0 standard drinks of alcohol   Drug use: No   Sexual activity: Not on file  Other Topics Concern   Not on file  Social History Narrative   single   Social Determinants of Health   Financial Resource Strain: Not on file  Food Insecurity: No Food Insecurity (10/16/2022)   Hunger Vital Sign    Worried About Running Out of Food in the Last Year: Never true    Ran Out of Food in the Last Year: Never true  Transportation Needs: No Transportation Needs (10/16/2022)   PRAPARE - Administrator, Civil Service (Medical): No    Lack of Transportation (Non-Medical): No  Physical Activity: Not on file  Stress: Not on file  Social Connections: Not on file  Intimate Partner Violence: Not At Risk (10/16/2022)   Humiliation, Afraid, Rape, and Kick questionnaire    Fear of Current or Ex-Partner: No    Emotionally Abused: No    Physically Abused: No    Sexually Abused: No     Allergies  Allergen Reactions   Aspirin    Pregabalin Other (See Comments)    Unsteady gate, causing falls   Trazodone Other (See Comments)    weakness   Tramadol Rash     CBC    Component Value Date/Time   WBC 9.4 10/15/2022 0531   RBC 4.97 10/15/2022 0531   HGB 13.8 10/15/2022 0531   HGB  11.5 (L) 02/22/2015 0851   HCT 41.3 10/15/2022 0531   HCT 35.9 02/22/2015 0851   PLT 330 10/15/2022 0531   PLT 454 (H) 02/22/2015 0851   MCV 83.1 10/15/2022 0531   MCV 63 (L) 02/22/2015 0851   MCH 27.8 10/15/2022 0531   MCHC 33.4 10/15/2022 0531   RDW 14.5 10/15/2022 0531   RDW 18.6 (H) 02/22/2015 0851   LYMPHSABS 0.6 (L) 10/15/2022 0531   LYMPHSABS 2.0 02/22/2015 0851   MONOABS 0.6 10/15/2022 0531   MONOABS 0.4 02/22/2015 0851   EOSABS 0.1 10/15/2022 0531   EOSABS 0.4 02/22/2015 0851   BASOSABS 0.1 10/15/2022 0531   BASOSABS 0.1 02/22/2015 0851    Pulmonary Functions Testing Results:    Latest Ref Rng & Units 07/25/2022    2:02 PM  PFT Results  FVC-Pre L 1.58   FVC-Predicted Pre % 56   FVC-Post L 1.83   FVC-Predicted Post % 64   Pre FEV1/FVC % % 86   Post FEV1/FCV % % 82   FEV1-Pre L 1.35   FEV1-Predicted Pre % 61   FEV1-Post L 1.50   DLCO uncorrected ml/min/mmHg 14.12   DLCO UNC% % 81  DLVA Predicted % 93   TLC L 4.81   TLC % Predicted % 111   RV % Predicted % 142     Outpatient Medications Prior to Visit  Medication Sig Dispense Refill   albuterol (VENTOLIN HFA) 108 (90 Base) MCG/ACT inhaler Inhale into the lungs.     aspirin EC 81 MG tablet Take by mouth.     atorvastatin (LIPITOR) 20 MG tablet TAKE 1 TABLET BY MOUTH AT BEDTIME FOR HIGH CHOLESTEROL     cetirizine (ZYRTEC) 10 MG tablet TAKE 1 TABLET BY MOUTH DAILY FOR ALLERGIES     dextromethorphan-guaiFENesin (MUCINEX DM) 30-600 MG 12hr tablet Take 1 tablet by mouth 2 (two) times daily as needed for cough. 14 tablet 0   esomeprazole (NEXIUM) 40 MG capsule Take 40 mg by mouth daily.      fluticasone furoate-vilanterol (BREO ELLIPTA) 100-25 MCG/ACT AEPB Inhale 1 puff into the lungs daily. 30 each 11   folic acid (FOLVITE) 1 MG tablet Take 1 mg by mouth daily.     isosorbide mononitrate (IMDUR) 30 MG 24 hr tablet Take 30 mg by mouth daily.     Multiple Vitamins-Minerals (MULTIVIT/MULTIMINERAL ADULT PO) Take by  mouth.     NITROGLYCERIN SL Place 1 tablet under the tongue.     promethazine (PHENERGAN) 12.5 MG tablet Take 1 tablet (12.5 mg total) by mouth every 6 (six) hours as needed for nausea or vomiting. 20 tablet 0   propranolol (INDERAL) 40 MG tablet TAKE 1 TABLET TWICE DAILY TO PREVENT HEADACHES     REXULTI 0.5 MG TABS TAKE 1 TABLET BY MOUTH AT BEDTIME FOR 1 WEEK THEN INCREASE TO 1 MG TABLET 67 tablet 1   VRAYLAR 1.5 MG capsule Take 1 capsule (1.5 mg total) by mouth daily. 30 capsule 3   oseltamivir (TAMIFLU) 75 MG capsule Take 1 capsule (75 mg total) by mouth 2 (two) times daily. 5 capsule 0   ferrous sulfate 325 (65 FE) MG tablet Take by mouth.     nicotine (NICODERM CQ - DOSED IN MG/24 HOURS) 21 mg/24hr patch Place 1 patch (21 mg total) onto the skin daily. (Patient not taking: Reported on 04/02/2023) 28 patch 0   risperiDONE (RISPERDAL) 1 MG tablet Take 0.5 tablets (0.5 mg total) by mouth at bedtime for 7 days, THEN 1 tablet (1 mg total) at bedtime. 33.5 tablet 0   venlafaxine XR (EFFEXOR-XR) 150 MG 24 hr capsule Take 2 capsules (300 mg total) by mouth every morning. (Patient not taking: Reported on 04/02/2023) 30 capsule 3   No facility-administered medications prior to visit.

## 2023-04-08 NOTE — Unmapped (Signed)
[facility] Refill Coordination Note    Specialty Medication(s) to be Shipped:   Hematology/Oncology: Courtney Erickson    Other medication(s) to be shipped: No additional medications requested for fill at this time     Courtney Erickson, DOB: [birth year]  Phone: [phone] (home)       All above HIPAA information was verified with patient.     Was a Nurse, learning disability used for this call? No    Completed refill call assessment today to schedule patient's medication shipment from the [facility] [phone]).  All relevant notes have been reviewed.     Specialty medication(s) and dose(s) confirmed: Regimen is correct and unchanged.   Changes to medications: [name] reports no changes at this time.  Changes to insurance: No  New side effects reported not previously addressed with a pharmacist or physician: None reported  Questions for the pharmacist: No    Confirmed patient received a Conservation officer, historic buildings and a Surveyor, mining with first shipment. The patient will receive a drug information handout for each medication shipped and additional FDA Medication Guides as required.       DISEASE/MEDICATION-SPECIFIC INFORMATION        N/A    SPECIALTY MEDICATION ADHERENCE     Medication Adherence    Patient reported X missed doses in the last month: 0  Specialty Medication: [name]  Patient is on additional specialty medications: No  Informant: patient     Were doses missed due to medication being on hold? No    Jakafi 15mg : 14 days of medicine on hand       REFERRAL TO PHARMACIST     Referral to the pharmacist: Not needed      [facility]     Shipping address confirmed in Epic.     Delivery Scheduled: Yes, Expected medication delivery date: 04/18/23.     Medication will be delivered via Next Day Courier to the prescription address in Epic [place].    [name]   [facility] Specialty Technician

## 2023-04-17 MED FILL — JAKAFI 15 MG TABLET: ORAL | 30 days supply | Qty: 60 | Fill #8

## 2023-04-17 NOTE — Unmapped (Signed)
Patient Courtney Erickson was contacted today regarding rescheduling appointment on 7/2. Voicemail was left for patient with information to call back.

## 2023-05-09 ENCOUNTER — Telehealth: Payer: Self-pay | Admitting: Student in an Organized Health Care Education/Training Program

## 2023-05-09 NOTE — Telephone Encounter (Addendum)
I spoke with the patient. She talked to [facility] today and they said they do not have her nebulizer order. Synetta Fail will you look into this? Also, she needs a refill on her Albuterol for the nebulizer, Dr. Hessie Diener usually gives it to her with another medication that she did not know the name of. She will reach out to Dr. Larrie Kass office to get her to fill the other medication.  [facility] [place].  Lastly she saw her Ortho provider today and her oxygen did not get above 90%. She has increased SOB for 3-4 days. No f/c/s. No cough or wheezing. She does not monitor her O2 at home.  Per Valeda Malm- she should go to the ED or Urgent Care to be evaluated for SOB and her O2.  I have notified the patient. She will go be seen at [facility] or the ED.

## 2023-05-09 NOTE — Telephone Encounter (Signed)
PT needs a nebulizer that we sent in to [facility] on 6/5.Order was confirmed.   She called and they do not have it. They asked her to have [place] resend it. Their   Fax is [phone]   PT's number [phone]  She is very out of breath ad has waited so long.

## 2023-05-12 ENCOUNTER — Telehealth: Payer: Self-pay | Admitting: Student in an Organized Health Care Education/Training Program

## 2023-05-12 DIAGNOSIS — J432 Centrilobular emphysema: Secondary | ICD-10-CM

## 2023-05-12 NOTE — Telephone Encounter (Signed)
Rec'd message from [place] with [facility] "When our processer contacted patient she stated she was not aware of order and hung up. Patient will need to call us in order for [place] to get address verified [phone]."

## 2023-05-12 NOTE — Telephone Encounter (Signed)
Pt states company needs a NEW NEBULIZER RX FAXED TO [phone]

## 2023-05-12 NOTE — Telephone Encounter (Signed)
Patient is aware of below message and voiced her understanding.  She will contact [facility]. I have provided her with contact number. Nothing further needed.

## 2023-05-12 NOTE — Telephone Encounter (Signed)
Urgent CM sent to [facility]

## 2023-05-12 NOTE — Telephone Encounter (Signed)
Order placed to [facility] for nebulizer machine.  Patient is aware and voiced her understanding.  Nothing further needed.

## 2023-05-16 NOTE — Unmapped (Signed)
[facility] Refill Coordination Note    Specialty Medication(s) to be Shipped:   Hematology/Oncology: Courtney Erickson    Other medication(s) to be shipped: No additional medications requested for fill at this time     Courtney Erickson, DOB: May 09, 1966  Phone: [phone] (home)       All above HIPAA information was verified with patient.     Was a Nurse, learning disability used for this call? No    Completed refill call assessment today to schedule patient's medication shipment from the [facility] [phone]).  All relevant notes have been reviewed.     Specialty medication(s) and dose(s) confirmed: Regimen is correct and unchanged.   Changes to medications: Josabet reports no changes at this time.  Changes to insurance: No  New side effects reported not previously addressed with a pharmacist or physician: None reported  Questions for the pharmacist: No    Confirmed patient received a Conservation officer, historic buildings and a Surveyor, mining with first shipment. The patient will receive a drug information handout for each medication shipped and additional FDA Medication Guides as required.       DISEASE/MEDICATION-SPECIFIC INFORMATION        N/A    SPECIALTY MEDICATION ADHERENCE     Medication Adherence    Patient reported X missed doses in the last month: 0  Specialty Medication: [name]  Patient is on additional specialty medications: No  Patient is on more than two specialty medications: No  Any gaps in refill history greater than 2 weeks in the last 3 months: no  Demonstrates understanding of importance of adherence: yes     Were doses missed due to medication being on hold? No    Jakafi 15mg :  4 days of medicine on hand       REFERRAL TO PHARMACIST     Referral to the pharmacist: Not needed      [facility]     Shipping address confirmed in Epic.     Delivery Scheduled: Yes, Expected medication delivery date: 05/19/23.     Medication will be delivered via Same Day Courier to the prescription address in Epic [place].    Courtney Erickson   [facility] Specialty Technician

## 2023-05-19 MED FILL — JAKAFI 15 MG TABLET: ORAL | 30 days supply | Qty: 60 | Fill #9

## 2023-05-21 ENCOUNTER — Other Ambulatory Visit: Admit: 2023-05-21 | Discharge: 2023-05-21 | Payer: MEDICARE

## 2023-05-21 ENCOUNTER — Ambulatory Visit: Admit: 2023-05-21 | Discharge: 2023-05-21 | Payer: MEDICARE | Attending: Adult Health | Primary: Adult Health

## 2023-05-21 DIAGNOSIS — D45 Polycythemia vera: Principal | ICD-10-CM

## 2023-05-21 DIAGNOSIS — F332 Major depressive disorder, recurrent severe without psychotic features: Principal | ICD-10-CM

## 2023-05-21 LAB — FERRITIN: FERRITIN: 77.4 ng/mL

## 2023-05-21 LAB — CBC W/ AUTO DIFF
BASOPHILS ABSOLUTE COUNT: 0.1 10*9/L (ref 0.0–0.1)
BASOPHILS RELATIVE PERCENT: 0.6 %
EOSINOPHILS ABSOLUTE COUNT: 0.2 10*9/L (ref 0.0–0.5)
EOSINOPHILS RELATIVE PERCENT: 1.6 %
HEMATOCRIT: 37.9 % (ref 34.0–44.0)
HEMOGLOBIN: 13 g/dL (ref 11.3–14.9)
LYMPHOCYTES ABSOLUTE COUNT: 1.4 10*9/L (ref 1.1–3.6)
LYMPHOCYTES RELATIVE PERCENT: 13.8 %
MEAN CORPUSCULAR HEMOGLOBIN CONC: 34.4 g/dL (ref 32.0–36.0)
MEAN CORPUSCULAR HEMOGLOBIN: 31.2 pg (ref 25.9–32.4)
MEAN CORPUSCULAR VOLUME: 90.6 fL (ref 77.6–95.7)
MEAN PLATELET VOLUME: 9.2 fL (ref 6.8–10.7)
MONOCYTES ABSOLUTE COUNT: 0.8 10*9/L (ref 0.3–0.8)
MONOCYTES RELATIVE PERCENT: 8 %
NEUTROPHILS ABSOLUTE COUNT: 7.6 10*9/L (ref 1.8–7.8)
NEUTROPHILS RELATIVE PERCENT: 76 %
PLATELET COUNT: 433 10*9/L (ref 150–450)
RED BLOOD CELL COUNT: 4.18 10*12/L (ref 3.95–5.13)
RED CELL DISTRIBUTION WIDTH: 13.2 % (ref 12.2–15.2)
WBC ADJUSTED: 10 10*9/L (ref 3.6–11.2)

## 2023-05-21 LAB — COMPREHENSIVE METABOLIC PANEL
ALBUMIN: 4 g/dL (ref 3.4–5.0)
ALKALINE PHOSPHATASE: 74 U/L (ref 46–116)
ALT (SGPT): 75 U/L — ABNORMAL HIGH (ref 10–49)
ANION GAP: 3 mmol/L — ABNORMAL LOW (ref 5–14)
AST (SGOT): 94 U/L — ABNORMAL HIGH (ref <=34)
BILIRUBIN TOTAL: 0.6 mg/dL (ref 0.3–1.2)
BLOOD UREA NITROGEN: 9 mg/dL (ref 9–23)
BUN / CREAT RATIO: 15
CALCIUM: 9.4 mg/dL (ref 8.7–10.4)
CHLORIDE: 109 mmol/L — ABNORMAL HIGH (ref 98–107)
CO2: 27 mmol/L (ref 20.0–31.0)
CREATININE: 0.62 mg/dL
EGFR CKD-EPI (2021) FEMALE: >90 mL/min/{1.73_m2} (ref >=60)
GLUCOSE RANDOM: 106 mg/dL (ref 70–179)
POTASSIUM: 5 mmol/L — ABNORMAL HIGH (ref 3.4–4.8)
PROTEIN TOTAL: 6.6 g/dL (ref 5.7–8.2)
SODIUM: 139 mmol/L (ref 135–145)

## 2023-05-21 LAB — LACTATE DEHYDROGENASE: LACTATE DEHYDROGENASE: 214 U/L (ref 120–246)

## 2023-05-21 NOTE — Unmapped (Unsigned)
Labs drawn via butterfly & sent for analysis.  Patient sent to next appointment. Care provided By Jenn Weyant, RN.

## 2023-05-21 NOTE — Unmapped (Signed)
ID: Courtney Erickson is a 57 y.o. 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 This treatment controlled her counts, but did not relieve many of the associated MPN symptoms.  For this reason, she was started on ruxolitinib.  At 15 mg BID, she continues to have good control of her counts. Initially, I felt that she was also getting symptomatic improvement from her ruxolitinib, but that is less clear now.  To complicate matters, many of her symptoms do not appear to be related to her MPN.      Courtney. Erickson is doing fair.  She continues to have pretty severe depression and anxiety.  She is taking effexor and her PCP started her on Zoloft as well.  She denies SI/HI.  I have re-sent a referral to her preferred psych provider.    Her PV is very well controlled.  She is in [facility] on this dose.  Her ferritin is stable - 77.4.    PLAN:  1) Continue ruxolitinib 15 mg BID  2) Continue ASA 81mg  daily for now  3) Continue Effexor XR 300mg  daily  4) Continue Zoloft daily  5) Referral sent to [facility]  6) Follow up in 3 months    Markus Jarvis, RN, MSN, AGPCNP-C  Nurse Practitioner  Hematologic Malignancies  [facility]  05/21/2023    I personally spent 35 minutes face-to-face and non-face-to-face in the care of this patient, which includes all pre, intra, and post visit time on the date of service. This time was spent in reviewing notes, labs and other test results in the chart, speaking with and examining the patient, communicating with other medical teams and documentation of the clinical encounter. All documented time was specific to the E/M visit and does not include any procedures that may have been performed.        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%    09/2015: Transitioned to [facility]; held when platelets normalized  05/2019: Normal colonoscopy; Grade I reflux  09/2019: CBC: 15.8/14.0/596; Restarted Hydrea 500 mg every day   10/2019: 12.7/13.2/591; Increased Hydrea to 1000 mg every day   05/04/20: 5.5/12.8/218; CD34: 0.04  08/07/20:   Variants of Known/Likely Clinical Significance:   Gene Coding Predicted Protein Variant allele fraction   JAK2 c.1849G>T p.(Val617Phe) 21.9 %   PTPN11 c.178G>A p.(Gly60Ser) 48.5 %     11/09/20: 5.6/13.2/279;   11/24/20: Began Ruxolitinib 10 mg BID; DC'd hydrea  12/14/20: Inc ruxolitinib 15 mg BID  03/22/21: 6.5/12.2/344  11/26/21: 8.6/11.3/395 ANC 5.6  01/24/22: 13.2/9.6/591; ferritin: 5.3  07/02/22: 12,2/13.0/38.6/419;   Ferritin: 23.3  AST: 68; ALT: 49  LDL: 50; TG 317  10/02/22: 8.8/41.6/60.6/301S; ferritin 79.2  01/01/23: 15.4/13.8/40/517 (just finished prednisone course) ferritin 72    INTERVAL HX  Courtney Erickson  comes for FU of her PCV.  We talked about the following:     Sweating/feeling hot:  This is all the time; worse at night time.  Has AC and fans on.  Can't get comfortable.    Mental Health: She is on her venlafaxine and now on Zoloft as well.  Reports her depression is bad; this is how I know how to feel.  No missed doses of Jakafi  Does take some tylenol for the pain.      MPN 10 Score  (App: [email or web address])   [email or web address]  (Useful for monitoring patient symptoms.    Symptom Range 7/21 1/22 2/17  5/26 10/31 04/08/22 06/2022 09/2022   Fatigue in the past 24 hours (Absent) 0 1 2 3 4 5 6 7 8 9  10 (Worst Imaginable) 10 10 10+ 10 10 10  8  Wants to just lie there - intertia  8   Filling up quickly when you eat (Early satiety)  (Absent) 0 1 2 3 4 5 6 7 8 9  10 (Worst Imaginable) 0 0 0 0 0 5 Feels full all the time - 3 bites 9   No weight loss - no reflux =-eat and get sick  No change in stools   10   Abdominal discomfort  (Absent) 0 1 2 3 4 5 6 7 8 9  10 (Worst Imaginable) 0 0 0 0 0 0 0 5   Inactivity (Absent) 0 1 2 3 4 5 6 7 8 9  10 (Worst Imaginable) 8 8 8 10 5 5 10 10    Problems with concentration - (Absent) 0 1 2 3 4 5 6 7 8 9  10 (Worst Imaginable) 5 8 5 10  0 10 0 5   Numbness/ Tingling (in my hands and feet) (Absent) 0 1 2 3 4 5 6 7 8 9  10 (Worst Imaginable) 3 3 3 3 7  0 0 0   Night sweats (Absent) 0 1 2 3 4 5 6 7 8 9  10 (Worst Imaginable) 8 8 0 3 8 10  Breaks out in sweat - 8 10   Itching (pruritus) (Absent) 0 1 2 3 4 5 6 7 8 9  10 (Worst Imaginable) 4  Night itch  aquagenic pruritus;  4 0  Sleep is better  3 0 0 10 - this symptom is back  On remeron - 6 years age   0   Bone pain (diffuse not joint pain or arthritis) (Absent) 0 1 2 3 4 5 6 7 8 9  10 (Worst Imaginable) 3 Severe but infreq  2   0 0 0 5 0 0   Fever (>100 F) (Absent) 0 1 2 3 4 5 6 7 8 9  10 (Daily) 0 0 0 0 0 0 0 0   Unintentional weight loss last 6 months (Absent) 0 1 2 3 4 5 6 7 8 9  10 (Worst Imaginable) 0 0 0 0 0 0 0 0   Total  41 43 26 42 30 45 45 38     PHYSICAL EXAM:  VS: As recorded in Epic  GENERAL: She appears reasonably well   HEENT: OP is clear  LYMPH NODES: No LAN  LUNGS: CTA  COR:RRR  ABD: NTND;   EXT: No edema    PMHx:    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  01/18/19: LD CT Chest: Few unchanged subcentimeter pulmonary nodules. No new nodule.   Lung-RADS: 2  Severe cervical spondylosis (9/20)  Colonoscopy (2016): single polyp  Bladder disease: atony, chronic interstitial cystitis, with implanted bladder stimulator  S/P hysterectomy for endometriosis  Psychiatric disease, stated history of major depression; h/o Abilify, Wellbutrin, BuSpar, Adderall, Valium; on Latuda, gabapentin, Topamax, Viibryd, Abilify (txed by [facility]  GERD, gastric ulcer, epigastric pain, with negative EGD and EUS for pancreatic disease (8/20)  Mammogram (01/2017): BI-RADS 2, 1 year follow-up recommended/scheduled  Nl Cardiac Cath (9/17)   COPD;  smoker (30 pk yr), on Advair and DuoNeb;   PFTs (12/15)  FVC was 2.27 liters, 79% of predicted  FEV1 was 1.86, 78% of predicted  FEV1 ratio was 82  FEF 25-75% liters per second was 88% of predicted    LUNG VOLUMES:  TLC was 71% of predicted  RV was 55% of predicted    DIFFUSION CAPACITY:  DLCO was 63% of predicted  DLCO/VA was 123% of predicted    FLOW VOLUME LOOP:  ? Expiratory flow volume is somewhat flattened

## 2023-05-26 ENCOUNTER — Ambulatory Visit: Admit: 2023-05-26 | Discharge: 2023-05-27 | Payer: MEDICARE

## 2023-05-26 DIAGNOSIS — M6289 Other specified disorders of muscle: Principal | ICD-10-CM

## 2023-05-26 DIAGNOSIS — R159 Full incontinence of feces: Principal | ICD-10-CM

## 2023-05-26 DIAGNOSIS — N941 Unspecified dyspareunia: Principal | ICD-10-CM

## 2023-05-26 DIAGNOSIS — N3946 Mixed incontinence: Principal | ICD-10-CM

## 2023-05-26 DIAGNOSIS — R102 Pelvic and perineal pain: Principal | ICD-10-CM

## 2023-05-26 DIAGNOSIS — G8929 Other chronic pain: Principal | ICD-10-CM

## 2023-05-26 DIAGNOSIS — M545 Chronic bilateral low back pain, unspecified whether sciatica present: Principal | ICD-10-CM

## 2023-05-26 DIAGNOSIS — R1084 Generalized abdominal pain: Principal | ICD-10-CM

## 2023-05-26 DIAGNOSIS — R152 Fecal urgency: Principal | ICD-10-CM

## 2023-05-26 NOTE — Unmapped (Signed)
[facility] Urogynecology and Reconstructive Pelvic Surgery  New Patient Evaluation & Consultation    Date: 05/26/2023     Referring Provider: Timoteo Ace Erickson*  Primary Care Physician: Courtney Gallus, MD    Subjective:     Chief Complaint: hematuria, pelvic pain    History of Present Illness:  Courtney Erickson is a 57 y.o. 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   Reports abd pain, chronic, Tylenol about 4 times a day.  She notes that the pain does not occur every day. The pain is predominantly suprapubic, R>L. No change in the pain with bowel habits. The pain wakes up from sleep. Urination causes pain to increase. She notes blood in urine. She has been treated in the past with steroids/levaquin; these have been effective. She has a h/o interstitial cystitis and even had an interstim device; she also has had cystopexy   Pt referred to urogynecology for hematuria and pelvic pain    She has been seen for these symptoms by urology in 01/12/2021. She has had longstanding UUI with a stress component since seeing urology in 2013. Previously failed Interstim due to buttocks pain.   Courtney Erickson is very pleasant 57 y.o.-year-old female with past medical history of gross hematuria status post negative evaluation in 2020 and prior InterStim placement removed for infection who is seen in follow-up for the evaluation of urge predominant lower urinary tract symptoms.  She is previously been followed by APP Courtney Erickson and Dr. Raoul Erickson, who recently performed a cystoscopy on her in March 2020.  Last seen by me 12/10/2019 during which time she was noted to have persistent urge predominant voiding symptoms for which she was started on mirabegron 50 mg daily.  Regarding hematuria history her last UA from 12/08/2019 did not reveal any RBCs.  She is a current smoker (1 pack/week x 30 years).  She denies anticoagulant use and does admit to past urinary tract infections.     In the interim she notes that mirabegron helped with urgency but she since ran out and has had a recurrence of her symptoms.  She denies fevers, chills, night sweats, hematuria, pyuria, dysuria, urinary retention and chest pain.  She does note chronic lower back pain and was asking if this could be related to her lower urinary tract symptoms.  Reassuringly her PVR today is 35 cc.  She continues to smoke tobacco.  UA today is without evidence of hematuria and is not suggestive of infection.    Plan:   This is a 57 y.o.-year-old female with a history of gross hematuria who returns in follow-up for bothersome lower urinary tract symptoms of unclear etiology.  Constellation of symptoms are consistent with IC/BPS, although given significant smoking history urothelial carcinoma must remain in the back of our differential.  Reassuringly her UA is without evidence of microhematuria today and she denies recent episodes of gross hematuria.     Plan:  [ ]  UA reflex culture  [ ]  Continue mirabegron 50 mg daily  [ ]  Resume Elmiron 100 mg 3 times daily either 1 hour before or 2 hours after meals given questionable history of interstitial cystitis  [ ]  Return to clinic in 3-4 months with any available APP for symptom check  [ ]  As needed referral to Dr. Raoul Erickson for discussion of  bladder Botox versus PTNS for medication refractory urge predominant LUTS\    CT A/P 01/07/22 for RLQ pain  Notable, relevant findings:  KIDNEYS/URETERS: Symmetric nephrograms. No enhancing solid renal mass lesion. No evidence of hydronephrosis.  BLADDER: Mostly decompressed, limiting evaluation.  REPRODUCTIVE ORGANS: Uterus is surgically absent. No adnexal mass lesion identified. Nonspecific small-volume gas in the vagina. Mild questionable wall thickening of the vagina.  GI TRACT: Nonvisualization of the appendix, likely surgically absent. There is no evidence of bowel thickening or obstruction. Mild colonic stool burden. Few scattered colonic diverticuli, without evidence to suggest diverticulitis.  BONES and SOFT TISSUES: No soft tissue abnormality. No worrisome osseous lesion. Multilevel degenerative changes are seen throughout spine. Well-defined sclerotic focus seen within the right lateral aspect of the L3 vertebral body, likely reflects a small bone island.    11/25/22 Courtney Erickson, consult:    1) Complex pain syndrome, with generalized back and abdominal pain, high-tone pelvic floor dysfunction and dyspareunia, as well as likely centralized component: She reports her doctor moved and so she is seeking care for IC. She has urgency and a dull vs sharp pain in her bladder. Feels like she has a UTI even when she doesn't. Currently taking no meds for bladder pain since her doctor left 4 years ago. Doesn't remember what was helpful or not helpful. Sometimes has bladder pain with intercourse so she avoids intercourse. On exam, significant tenderness on pelvic floor assessment today. Also generalized pain to abdomin and back  -Differential: pelvic floor dysfunction/myofascial pain, back and hip DJD, centralization of pain  -Plan: PFPT - [address], perineal icing and/or heat packs, valium 5 mg pills every night for 6 weeks, then PRN; minimally invasive gynecology chronic pain clinic to see if there are other factors that could be contributing to her pain and supplementary therapies to address her CPP; referral to pain management, for her generalized and back pain    2) Mixed urinary incontinence: +SUI and UUI, no pads, +frequency, nocturia 3x,     -Plan: behavioral modifications such as limiting bladder irritants such as soda and coffee, limit total intake, PFPT    3) Accidental bowel leakage   -Plan: psyllium daily, PFPT    4) Gross hematuria - Patient reporting gross hematuria as recently as one year ago in the setting of several risk factors (smoking history, mother with bladder cancer). Prior workup was last in 2020.  - Last CT A/P 01/07/22 negative for GU pathology, no need to repeat at this time.  -Repeat cystoscopy    12/24/22: Cystoscopy  Findings:  Bladder: normal in appearance with no masses/lesions/ulcerations, no foreign bodies/mesh  Urethra: normal caliber  Ureters: ureteral orifices visualized bilaterally with clear efflux visualized bilaterally    Today 05/26/23  Recently treated for UTI - frequency, urgency. Antibiotics helped with pain, burning, bumps on the outside of the vulva.   She is using vaginal estrogen twice a week. She was unable to do pelvic floor PT - [facility] did not call her back. [facility] suggested low dose vaginal estrogen and potential TPI in future. Vaginal valium did not seem to help.   She does report that she gets thirsty and that she drinks a lot and this doesn't help. When she drinks water, it makes her stomach uncomfortable. She drinks 12 oz of Mtn Dew daily.  Every time she drinks something, she has the urgency to urinate.   Pelvic pain and feeling spasm like it can't relax is #1 bother. After that, it's urgency and leakage.  Past Medical History:   Diagnosis Date    Abnormal findings on esophagogastroduodenoscopy (EGD) 12/16/2013    For abdominal discomfort; DDAVP; with EUS; no abnormal findings, and pancreas unremarkable    Abnormal Pap smear of cervix     Adult ADHD     ANA positive     Anxiety     Asthma     Atony of bladder     Bipolar disorder (CMS-HCC)     Chronic interstitial cystitis     Chronic pain syndrome     COPD (chronic obstructive pulmonary disease) (CMS-HCC)     Depression     Diabetes (CMS-HCC)     Difficult intravenous access     Dyspareunia     Endometriosis     Female stress incontinence     Gastric ulcer     GERD (gastroesophageal reflux disease)     Gross hematuria     H/O colonoscopy 12/2011    Incomplete bladder emptying     Incontinence without sensory awareness     Insomnia     Other chronic cystitis Other functional disorder of bladder     Pain medication agreement signed 08/19/2012    Polycythemia vera (CMS-HCC)     Polycythemia vera(238.4)     Scoliosis     Urge incontinence     Urinary frequency     Von Willebrand's disease (CMS-HCC)        Past Surgical History:   Procedure Laterality Date    APPENDECTOMY      BREAST BIOPSY Right     age 60     benign    HYSTERECTOMY  1999    total    InterStim Device  2003    Bladder Stimulator    OOPHORECTOMY      PR INS/RPLC [facility] SAC/GSTRC NPG/RCVR PCKT CRTJ&CONN Right 02/06/2015    Procedure: INSERTION OR REPLACEMENT PERIPHERAL/GASTRIC NEUROSTIMULATOR PULSE GENERAT/RECEIVE, DIRECT/INDUCTIV COUPLING;  Surgeon: Smith Robert, MD;  Location: [facility] OR [facility];  Service: Urology    PR REV/RMV [facility] SAC/GSTRC NPG/RCV [facility] CONN ELTR RA N/A 09/22/2017    Procedure: REVISION OR REMOVAL OF PERIPHERAL OR GASTRIC NEUROSTIMULATOR PULSE GENERATOR OR RECEIVER;  Surgeon: Norval Morton, MD;  Location[facility] OR [facility];  Service: Urology    PR REVJ/RMVL [facility] NEUROSTIMULATOR ELECTRODE ARRAY N/A 09/22/2017    Procedure: REVISION OR REMOVAL OF PERIPHERAL NEUROSTIMULATOR ELECTRODE ARRAY;  Surgeon: Norval Morton, MD;  Location: [facility] OR [facility];  Service: Urology    ROTATOR CUFF REPAIR Left     x 2    TOTAL KNEE ARTHROPLASTY Left 2006    Given Humate-P         Current Outpatient Medications:     ADVAIR DISKUS 250-50 mcg/dose diskus, Inhale 1 puff  in the morning and 1 puff in the evening., Disp: , Rfl:     albuterol HFA 90 mcg/actuation inhaler, , Disp: , Rfl:     aspirin (ECOTRIN) 81 MG tablet, Take 1 tablet (81 mg total) by mouth daily., Disp: 150 tablet, Rfl: 2    atorvastatin (LIPITOR) 20 MG tablet, Take 1 tablet (20 mg total) by mouth daily., Disp: 30 tablet, Rfl: 11    diazePAM (VALIUM) 5 MG tablet, Place one tablet in the vagina every night for 6 weeks, then as needed for severe pain, Disp: 30 tablet, Rfl: 2    esomeprazole (NEXIUM) 40 MG capsule, Take 1 capsule (40 mg total) by mouth daily., Disp: , Rfl:  estradiol (ESTRACE) 0.01 % (0.1 mg/gram) vaginal cream, Insert 2 g into the vagina daily for 14 days, THEN 1 g Two (2) times a week., Disp: 42.5 g, Rfl: 1    ferrous sulfate 325 (65 FE) MG tablet, TAKE 1 TABLET (325 MG TOTAL) BY MOUTH EVERY TUESDAY, THURSDAY, SATURDAY, SUNDAY., Disp: 30 tablet, Rfl: 3    ipratropium-albuterol (DUO-NEB) 0.5-2.5 mg/3 mL nebulizer, Inhale 3 mL by nebulization every six (6) hours as needed., Disp: 360 mL, Rfl: 11    isosorbide mononitrate (ISMO,MONOKET) 10 MG tablet, Take 1 tablet (10 mg total) by mouth two (2) times a day., Disp: , Rfl:     mirtazapine (REMERON) 15 MG tablet, TAKE 1 TABLET BY MOUTH EVERY DAY AT NIGHT, Disp: 90 tablet, Rfl: 4    propranoloL (INDERAL) 40 MG tablet, Take 1 tablet (40 mg total) by mouth in the morning., Disp: , Rfl:     ruxolitinib (JAKAFI) 15 mg tablet, Take 1 tablet (15 mg total) by mouth Two (2) times a day., Disp: 60 tablet, Rfl: 11    SPIRIVA WITH HANDIHALER 18 mcg inhalation capsule, , Disp: , Rfl:     venlafaxine (EFFEXOR-XR) 150 MG 24 hr capsule, TAKE 1 CAPSULE BY MOUTH DAILY. THEN INCREASE TO 2 CAPSULES BY MOUTH DAILY AFTER 2 WEEKS., Disp: 180 capsule, Rfl: 1    Current Facility-Administered Medications:     lidocaine 2% gel (XYLOCAINE) jelly urojet 20 mL, 20 mL, Urethral, Once, Willis-Gray, Dutch Quint, MD    Allergies:  Patient is allergic to lyrica [pregabalin], trazodone, gabapentin, and tramadol.     Obstetric History:  OB History   Gravida Para Term Preterm AB Living   3 3 3          SAB IAB Ectopic Molar Multiple Live Births                    # Outcome Date GA Lbr Len/2nd Weight Sex Type Anes PTL Lv   3 Term            2 Term            1 Term               Obstetric Comments   Last Pap: hyst - 2004   Abnormal prior to hyst   Ovaries remain        Gynecologic History:  She is menopausal.    She does have a history of abnormal Pap smears.   Last pap smear was 25 years ago.    Last colonoscopy was 2020.    She is sexually active.     Family History:  family history includes Cancer (age of onset: 47) in her father; Cancer (age of onset: 52) in her mother; GU problems in her mother; Hyperlipidemia in her sister.     Social History:  Occupation: not currently working  Patient  reports that she quit smoking about 6 years ago. Her smoking use included cigarettes. She started smoking about 24 years ago. She has a 15 pack-year smoking history. She has never used smokeless tobacco. She reports that she does not drink alcohol and does not use drugs.     Review of Systems:  A 10 point review of systems was performed.   Positive for as in HPI, otherwise negative review of systems    Objective:   Physical Exam:  BP 105/64  - Pulse 92  - Ht 149.9 cm (4' 11)  - Wt 70.8 kg (156  lb)  - BMI 31.51 kg/m??   Medical chaperone present for exam:  NA    Constitutional:  General appearance: Well nourished, well developed female in no acute distress.   Psych:  Normal mood and affect  Neuro:  Alert and oriented x 3  Respiratory:  Normal respiratory effort.     Genitourinary: - DEFERRED - Prior exam  Pelvic Exam:   Vulvar exam: clear. No scarring or architectural change of the labia minora, interlabial creases, labia majora, clitoral hood, posterior fourchette, perineum, perianal skin,  Clitoral hood clear   Introitus:No midline agglutination, no narrowing or decrease in introital aperture, urethral meatus fully visible   Bartholin's and Skene's glands normal in appearance    Urethra: urethral meatus normal in appearance, no urethral masses or discharge. - CST/- VST    Speculum exam: vaginal mucosa without lesions and with atrophy  Cervix removed surgically.   Uterus absent   Adnexa  no mass, fullness, tenderness. No masses on bimanual exam.      Prolapse:        11/25/2022     1:45 PM   POP-Q Exams   gh 2   pb 2.5   tvl 9   aa -3   ba -3   ap -3   bp -3   c -8   d --       Anal verge:  Normal in appearance, without evidence of external hemorrhoids; without rectal prolapse    Rectal:   Deferred    Pelvic Floor Muscle Pain Exam:  Right retropubic: 7/10  Right obturator: 9/10  Right levator ani: 7/10  Left levator ani: 9/10  Left obturator: 8/10  Left retropubic: 0/10      Post-Void Residual (PVR) by Bladder Scan:  11/25/22 PVR of 93 ml was obtained by bladder scan.    Assessment/Plan:      Assessment:  This is a 57 y.o. G70P3000 female who presented with    1. Pelvic pain in female    2. High-tone pelvic floor dysfunction    3. Dyspareunia in female    4. Chronic bilateral low back pain, unspecified whether sciatica present    5. Mixed stress and urge urinary incontinence    6. Incontinence of feces with fecal urgency    7. Generalized abdominal pain      Plan:   1) Complex pain syndrome, with generalized back and abdominal pain, high-tone pelvic floor dysfunction and dyspareunia, as well as likely centralized component  - with significant tenderness on pelvic floor assessment prior exam. Also generalized pain to abdomen and back  -Given feeling of spasm and inability to relax, we discussed the importance of follow up with pelvic floor PT and even attending less frequent sessions depending on limitations. She will call [facility] to schedule an appointment.   -She will follow up after 6 sessions of PFPT, and consider follow up with [facility] for TPI if no improvement afterwards.     2) Mixed urinary incontinence  - Not as bothersome today. Recent UTI. Patient instructed to cut back Diet Mtn Dew to 6 oz if possible  - Pelvic floor physical therapy likely to help with urgency symptoms as well.   - Reassess as pelvic floor dysfunction improved    3) Accidental bowel leakage  - Not discussed today, previously discussed psyllium daily and PFPT.    4) Gross hematuria  - Cystoscopy in 2024 with [place] was negative. Last CT A/P 01/07/22 negative for GU pathology. Workup reassuring.  Follow up after 6 sessions of pelvic floor PT.    I personally spent 25 minutes face-to-face and non-face-to-face in the care of this patient, which includes all pre, intra, and post visit time on the date of service.    Questions were answered to the patient's satisfaction.    Sherian Maroon, MD  ATTENDING

## 2023-05-26 NOTE — Unmapped (Addendum)
For urinary leakage  Try to cut [facility] Dew/Soda down to 6 oz  Consider Hint, which is a water supplement that does not contain bladder irritants    Pelvic floor physical therapy can help you with bladder control as well    For chronic pelvic pain     TODAY we have planned to start the following for treatment of your symptoms  Pelvic floor physical therapy at  - please call as soon as possible to try to get an appointmnent    Mariane Masters, PT; Joycie Peek, PT  [facility]  [address]  [place], [place] [address]  PH: [phone]    Come back after 6 sessions of pelvic floor physical therapy.

## 2023-06-17 NOTE — Unmapped (Signed)
[facility] Refill Coordination Note    Specialty Medication(s) to be Shipped:   Hematology/Oncology: Courtney Erickson    Other medication(s) to be shipped: No additional medications requested for fill at this time     Courtney Erickson, DOB: Feb 16, 1966  Phone: [phone] (home)       All above HIPAA information was verified with patient.     Was a Nurse, learning disability used for this call? No    Completed refill call assessment today to schedule patient's medication shipment from the [facility] [phone]).  All relevant notes have been reviewed.     Specialty medication(s) and dose(s) confirmed: Regimen is correct and unchanged.   Changes to medications: Courtney Erickson reports no changes at this time.  Changes to insurance: No  New side effects reported not previously addressed with a pharmacist or physician: None reported  Questions for the pharmacist: No    Confirmed patient received a Conservation officer, historic buildings and a Surveyor, mining with first shipment. The patient will receive a drug information handout for each medication shipped and additional FDA Medication Guides as required.       DISEASE/MEDICATION-SPECIFIC INFORMATION        N/A    SPECIALTY MEDICATION ADHERENCE     Medication Adherence    Patient reported X missed doses in the last month: 0  Specialty Medication: Jakafi 15mg   Patient is on additional specialty medications: No  Informant: patient     Were doses missed due to medication being on hold? No    Jakafi 15mg : 3 days of medicine on hand       REFERRAL TO PHARMACIST     Referral to the pharmacist: Not needed      [facility]     Shipping address confirmed in Epic.     Delivery Scheduled: Yes, Expected medication delivery date: 06/19/23.     Medication will be delivered via Next Day Courier to the prescription address in Epic [place].    Courtney Erickson   [facility] Specialty Technician

## 2023-06-18 MED FILL — JAKAFI 15 MG TABLET: ORAL | 30 days supply | Qty: 60 | Fill #10

## 2023-06-23 ENCOUNTER — Other Ambulatory Visit: Payer: Self-pay

## 2023-06-23 ENCOUNTER — Emergency Department
Admission: EM | Admit: 2023-06-23 | Discharge: 2023-06-23 | Disposition: A | Discharge status: Home / Self Care | Payer: 59 | Attending: Student in an Organized Health Care Education/Training Program | Admitting: Student in an Organized Health Care Education/Training Program

## 2023-06-23 ENCOUNTER — Encounter: Payer: Self-pay | Admitting: Emergency Medicine

## 2023-06-23 DIAGNOSIS — K921 Melena: Secondary | ICD-10-CM | POA: Diagnosis not present

## 2023-06-23 DIAGNOSIS — R195 Other fecal abnormalities: Secondary | ICD-10-CM

## 2023-06-23 DIAGNOSIS — K625 Hemorrhage of anus and rectum: Secondary | ICD-10-CM | POA: Diagnosis present

## 2023-06-23 LAB — CBC
HCT: 39.7 % (ref 36.0–46.0)
Hemoglobin: 13.2 g/dL (ref 12.0–15.0)
MCH: 29.9 pg (ref 26.0–34.0)
MCHC: 33.2 g/dL (ref 30.0–36.0)
MCV: 89.8 fL (ref 80.0–100.0)
Platelets: 426 10*3/uL — ABNORMAL HIGH (ref 150–400)
RBC: 4.42 MIL/uL (ref 3.87–5.11)
RDW: 13.2 % (ref 11.5–15.5)
WBC: 8.5 10*3/uL (ref 4.0–10.5)
nRBC: 0 % (ref 0.0–0.2)

## 2023-06-23 LAB — COMPREHENSIVE METABOLIC PANEL
ALT: 54 U/L — ABNORMAL HIGH (ref 0–44)
AST: 71 U/L — ABNORMAL HIGH (ref 15–41)
Albumin: 4.2 g/dL (ref 3.5–5.0)
Alkaline Phosphatase: 56 U/L (ref 38–126)
Anion gap: 9 (ref 5–15)
BUN: 16 mg/dL (ref 6–20)
CO2: 25 mmol/L (ref 22–32)
Calcium: 9 mg/dL (ref 8.9–10.3)
Chloride: 102 mmol/L (ref 98–111)
Creatinine, Ser: 0.64 mg/dL (ref 0.44–1.00)
GFR, Estimated: >60 mL/min (ref >60)
Glucose, Bld: 105 mg/dL — ABNORMAL HIGH (ref 70–99)
Potassium: 3.9 mmol/L (ref 3.5–5.1)
Sodium: 136 mmol/L (ref 135–145)
Total Bilirubin: 1.1 mg/dL (ref 0.3–1.2)
Total Protein: 6.9 g/dL (ref 6.5–8.1)

## 2023-06-23 LAB — TYPE AND SCREEN
ABO/RH(D): O POS
Antibody Screen: NEGATIVE

## 2023-06-23 MED ORDER — OMEPRAZOLE MAGNESIUM 20 MG PO TBEC: 20.0000 mg | DELAYED_RELEASE_TABLET | Freq: Every day | ORAL | 1 refills | Status: DC

## 2023-06-23 NOTE — ED Triage Notes (Signed)
Patient to ED via POV for rectal bleeding. States intermittent Lauren Escobar, tarry stools x3 days. Also having SOB- hx of COPD.

## 2023-06-23 NOTE — Discharge Instructions (Signed)
Please follow-up with PCP.  Take antiacid medication as I have prescribed.  Please return if you start having any black or tarry stools or notice any bleeding from your rectum.  Return for any additional questions or concerns.

## 2023-06-23 NOTE — ED Provider Notes (Signed)
[facility] Provider Note    Event Date/Time   First MD Initiated Contact with Patient 06/23/23 1408     (approximate)   History   Rectal Bleeding   HPI  Lauren Escobar is a 57 y.o. female with a history of GERD as well as colon polyps presents to the ER for evaluation of reported dark stool.  Was seen by PCP today and sent over to the ER due to concern for GI bleed.  She is not on any blood thinners.  Has not been taking any NSAIDs.  Does not drink alcohol.  States that yesterday had several bowel movements were dark brown in color.  No black or tarry stool.  No bright red blood per rectum.  Denies any pain.     Physical Exam   Triage Vital Signs: ED Triage Vitals  Encounter Vitals Group     BP 06/23/23 1109 128/87     Systolic BP Percentile --      Diastolic BP Percentile --      Pulse Rate 06/23/23 1109 79     Resp 06/23/23 1109 20     Temp 06/23/23 1109 97.7 F (36.5 C)     Temp Source 06/23/23 1109 Oral     SpO2 06/23/23 1109 100 %     Weight 06/23/23 1110 164 lb (74.4 kg)     Height 06/23/23 1110 4\' 11"  (1.499 m)     Head Circumference --      Peak Flow --      Pain Score 06/23/23 1109 0     Pain Loc --      Pain Education --      Exclude from Growth Chart --     Most recent vital signs: Vitals:   06/23/23 1109 06/23/23 1400  BP: 128/87 100/62  Pulse: 79 69  Resp: 20   Temp: 97.7 F (36.5 C)   SpO2: 100%      Constitutional: Alert  Eyes: Conjunctivae are normal.  Head: Atraumatic. Nose: No congestion/rhinnorhea. Mouth/Throat: Mucous membranes are moist.   Neck: Painless ROM.  Cardiovascular:   Good peripheral circulation. Respiratory: Normal respiratory effort.  No retractions.  Gastrointestinal: Soft and nontender.  Rectal exam chaperoned by RN Shanda Bumps without evidence of bleeding hemorrhoid or fissure.  Soft brown stool on DRE is guaiac negative. Musculoskeletal:  no deformity Neurologic:  MAE spontaneously. No  gross focal neurologic deficits are appreciated.  Skin:  Skin is warm, dry and intact. No rash noted. Psychiatric: Mood and affect are normal. Speech and behavior are normal.    ED Results / Procedures / Treatments   Labs (all labs ordered are listed, but only abnormal results are displayed) Labs Reviewed  COMPREHENSIVE METABOLIC PANEL - Abnormal; Notable for the following components:      Result Value   Glucose, Bld 105 (*)    AST 71 (*)    ALT 54 (*)    All other components within normal limits  CBC - Abnormal; Notable for the following components:   Platelets 426 (*)    All other components within normal limits  POC OCCULT BLOOD, ED  TYPE AND SCREEN     EKG     RADIOLOGY    PROCEDURES:  Critical Care performed:   Procedures   MEDICATIONS ORDERED IN ED: Medications - No data to display   IMPRESSION / MDM / ASSESSMENT AND PLAN / ED COURSE  I reviewed the triage vital signs and the nursing notes.  Differential diagnosis includes, but is not limited to, UGIB, LGIB, hemorrhoid, fissure, IBS, mass  Patient presenting to the ER for evaluation of symptoms as described above.  Based on symptoms, risk factors and considered above differential, this presenting complaint could reflect a potentially life-threatening illness therefore the patient will be placed on continuous pulse oximetry and telemetry for monitoring.  Laboratory evaluation will be sent to evaluate for the above complaints.  No anemia.  BUN normal.  Guaiac negative dative exam.  Not consistent with acute upper GI bleed.  No sign of bleeding at this time.  Abdominal exam soft and benign.  Given her history of GERD and she is not currently on any antacid medication will give prescription for this as well as referral to GI.Marland Kitchen  Do not feel that further diagnostic testing imaging or admission clinically indicated based on this presentation.       FINAL CLINICAL IMPRESSION(S) / ED  DIAGNOSES   Final diagnoses:  Dark stools     Rx / DC Orders   ED Discharge Orders          Ordered    omeprazole (PRILOSEC OTC) 20 MG tablet  Daily        06/23/23 1427             Note:  This document was prepared using Dragon voice recognition software and may include unintentional dictation errors.    Willy Eddy, MD 06/23/23 [phone]

## 2023-06-23 NOTE — ED Notes (Signed)
Hemoccult sample negative

## 2023-06-24 ENCOUNTER — Telehealth: Payer: Self-pay | Admitting: Student in an Organized Health Care Education/Training Program

## 2023-06-24 DIAGNOSIS — J432 Centrilobular emphysema: Secondary | ICD-10-CM

## 2023-06-24 NOTE — Telephone Encounter (Signed)
Pt. Calling the DME company said the order is to old to bet there neb. Machine please advise pt.

## 2023-06-25 NOTE — Telephone Encounter (Signed)
Neb machine order was placed on 04/02/23 confirmed by Elease Hashimoto, another neb machine order placed on 05/12/23 confirmed by Elease Hashimoto. Not sure why the patient never got the neb machine in a timely manner. I have sent urgent message to [employer] to see if they can answer this question

## 2023-07-04 MED ORDER — ALBUTEROL SULFATE (2.5 MG/3ML) 0.083% IN NEBU
2.5000 mg | INHALATION_SOLUTION | Freq: Four times a day (QID) | RESPIRATORY_TRACT | 12 refills | Status: AC | PRN
Start: 2023-07-04 — End: ?

## 2023-07-04 NOTE — Telephone Encounter (Signed)
Can you check on this to see if we can close the encounter

## 2023-07-04 NOTE — Telephone Encounter (Signed)
I just spoke with Lauren Escobar and she confirmed that she has the neb machine but now she needs the neb medication to be sent to [facility]

## 2023-07-04 NOTE — Telephone Encounter (Signed)
Called and spoke to patient. She is requesting Rx for albuterol solution. Preferred pharmacy is [facility] in [place].  Verified that she has a nebulizer machine.     Dr. Aundria Rud, please advise. Thanks

## 2023-07-04 NOTE — Telephone Encounter (Signed)
Order placed and sent. Thank you

## 2023-07-04 NOTE — Telephone Encounter (Signed)
I have notified the patient. Nothing further needed. 

## 2023-07-08 ENCOUNTER — Encounter: Payer: Self-pay | Admitting: Student in an Organized Health Care Education/Training Program

## 2023-07-08 ENCOUNTER — Ambulatory Visit (INDEPENDENT_AMBULATORY_CARE_PROVIDER_SITE_OTHER): Payer: 59 | Admitting: Student in an Organized Health Care Education/Training Program

## 2023-07-08 VITALS — BP 100/60 | HR 77 | Temp 98.1°F | Resp 16 | Ht 59.0 in | Wt 167.8 lb

## 2023-07-08 DIAGNOSIS — R0602 Shortness of breath: Secondary | ICD-10-CM

## 2023-07-08 DIAGNOSIS — J432 Centrilobular emphysema: Secondary | ICD-10-CM

## 2023-07-08 MED ORDER — SPIRIVA RESPIMAT 2.5 MCG/ACT IN AERS
2.0000 | INHALATION_SPRAY | Freq: Every day | RESPIRATORY_TRACT | 12 refills | Status: DC
Start: 2023-07-08 — End: 2024-01-07

## 2023-07-08 MED ORDER — SPIRIVA RESPIMAT 2.5 MCG/ACT IN AERS: 2.0000 | INHALATION_SPRAY | Freq: Every day | RESPIRATORY_TRACT | Status: DC

## 2023-07-08 NOTE — Progress Notes (Signed)
Synopsis: Referred in for COPD by Oswaldo Conroy, MD  Assessment & Plan:   1. Shortness of breath 2. Centrilobular emphysema (HCC) 3. Preserved Ratio Impaired Spirometry (PRiSM)  Presents for follow up of shortness of breath with PFT's from 06/2022 showing an FEV1 of 1.5L (68%) post bronchodilator challenge with preserved ratio (FEV1/FVC of 0.86). She has normal TLC and signs of air trapping (RV/TLC of 142% predicted). DLCO is normal at 81% predicted. Lung cancer screening CT's also showed emphysema .    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 Will continue triple LABA/LAMA/ICS therapy with Spiriva respimat (new script sent) and Breo Ellipta. I had placed a referral to pulmonary rehab during a previous visit. We will provide her with inhaler training (respimat) today.   -Continue Breo Ellipta 1 puff once daily -Initiate Spiriva Respimat 2.5 mc/actuation two puffs once daily -Inhaler training today  Return in about 1 year (around 07/07/2024).  I spent 30 minutes caring for this patient today, including preparing to see the patient, obtaining a medical history , reviewing a separately obtained history, performing a medically appropriate examination and/or evaluation, counseling and educating the patient/family/caregiver, ordering medications, tests, or procedures, and documenting clinical information in the electronic health record  Raechel Chute, MD [facility] 07/08/2023 9:19 AM    End of visit medications:  Meds ordered this encounter  Medications   Tiotropium Bromide Monohydrate (SPIRIVA RESPIMAT) 2.5 MCG/ACT AERS    Sig: Inhale 2 puffs into the lungs daily.    Dispense:  60 each    Refill:  12   Tiotropium Bromide Monohydrate (SPIRIVA RESPIMAT) 2.5 MCG/ACT AERS    Sig: Inhale 2 puffs into the lungs daily.    Order Specific Question:   Lot Number?    Answer:   [id number]    Order Specific Question:   Expiration Date?    Answer:    01/27/2024    Order Specific Question:   Quantity    Answer:   1     Current Outpatient Medications:    albuterol (PROVENTIL) (2.5 MG/3ML) 0.083% nebulizer solution, Take 3 mLs (2.5 mg total) by nebulization every 6 (six) hours as needed for wheezing or shortness of breath., Disp: 75 mL, Rfl: 12   albuterol (VENTOLIN HFA) 108 (90 Base) MCG/ACT inhaler, Inhale into the lungs., Disp: , Rfl:    aspirin EC 81 MG tablet, Take by mouth., Disp: , Rfl:    atorvastatin (LIPITOR) 20 MG tablet, TAKE 1 TABLET BY MOUTH AT BEDTIME FOR HIGH CHOLESTEROL, Disp: , Rfl:    cetirizine (ZYRTEC) 10 MG tablet, TAKE 1 TABLET BY MOUTH DAILY FOR ALLERGIES, Disp: , Rfl:    dextromethorphan-guaiFENesin (MUCINEX DM) 30-600 MG 12hr tablet, Take 1 tablet by mouth 2 (two) times daily as needed for cough., Disp: 14 tablet, Rfl: 0   esomeprazole (NEXIUM) 40 MG capsule, Take 40 mg by mouth daily. , Disp: , Rfl:    fluticasone furoate-vilanterol (BREO ELLIPTA) 100-25 MCG/ACT AEPB, Inhale 1 puff into the lungs daily., Disp: 30 each, Rfl: 11   folic acid (FOLVITE) 1 MG tablet, Take 1 mg by mouth daily., Disp: , Rfl:    isosorbide mononitrate (IMDUR) 30 MG 24 hr tablet, Take 30 mg by mouth daily., Disp: , Rfl:    Multiple Vitamins-Minerals (MULTIVIT/MULTIMINERAL ADULT PO), Take by mouth., Disp: , Rfl:    nicotine (NICODERM CQ - DOSED IN MG/24 HOURS) 21 mg/24hr patch, Place 1 patch (21 mg total) onto the skin  daily., Disp: 28 patch, Rfl: 0   NITROGLYCERIN SL, Place 1 tablet under the tongue., Disp: , Rfl:    omeprazole (PRILOSEC OTC) 20 MG tablet, Take 1 tablet (20 mg total) by mouth daily., Disp: 28 tablet, Rfl: 1   promethazine (PHENERGAN) 12.5 MG tablet, Take 1 tablet (12.5 mg total) by mouth every 6 (six) hours as needed for nausea or vomiting., Disp: 20 tablet, Rfl: 0   propranolol (INDERAL) 40 MG tablet, TAKE 1 TABLET TWICE DAILY TO PREVENT HEADACHES, Disp: , Rfl:    REXULTI 0.5 MG TABS, TAKE 1 TABLET BY MOUTH AT BEDTIME FOR 1  WEEK THEN INCREASE TO 1 MG TABLET, Disp: 67 tablet, Rfl: 1   Tiotropium Bromide Monohydrate (SPIRIVA RESPIMAT) 2.5 MCG/ACT AERS, Inhale 2 puffs into the lungs daily., Disp: 60 each, Rfl: 12   Tiotropium Bromide Monohydrate (SPIRIVA RESPIMAT) 2.5 MCG/ACT AERS, Inhale 2 puffs into the lungs daily., Disp: , Rfl:    venlafaxine XR (EFFEXOR-XR) 150 MG 24 hr capsule, Take 2 capsules (300 mg total) by mouth every morning., Disp: 30 capsule, Rfl: 3   VRAYLAR 1.5 MG capsule, Take 1 capsule (1.5 mg total) by mouth daily., Disp: 30 capsule, Rfl: 3   ferrous sulfate 325 (65 FE) MG tablet, Take by mouth., Disp: , Rfl:    risperiDONE (RISPERDAL) 1 MG tablet, Take 0.5 tablets (0.5 mg total) by mouth at bedtime for 7 days, THEN 1 tablet (1 mg total) at bedtime., Disp: 33.5 tablet, Rfl: 0   Subjective:   PATIENT ID: Lauren Escobar GENDER: female DOB: Apr 27, 1966, MRN: [id number]  Chief Complaint  Patient presents with   Follow-up    Patient reports cough and shortness of breath x a couple of days.     HPI  57 year old female with a self-reported history of bronchitis who presents to clinic for follow up on shortness of breath.   Patient last seen by me in June of 2024 for follow up. At that time, she felt her breathing was stable and at baseline. Today, she reports shortness of breath with exertion as well as a cough. Discussed her medication regimen, she never started the Spiriva, but is using her nebulizer frequently. She reports compliance with Breo Ellipta. She did receive a course of prednisone and antibiotics in July for COPD exacerbation.  She's not on any new medications, and categorically denies any alcohol or illicit drug use. She has no fevers, no stiffness, and no flushing. There has been no change to symptoms otherwise.  Shortness of breath has been at rest and with exertion. This is limiting to her ability to walk short distances. She continues to have a dry cough. Her primary care provider  prescribed her antibiotics and steroids in the past for management of presumed COPD. For her shortness of breath, she has been seen by cardiology and has had an evaluation and subsequently discharged from their care. She also underwent a pulmonary function test that was performed in our hospital.  She has had multiple inhalers over the years and there is some confusion over her inhaler regimen.  She is currently on spiriva respimat and breo ellipta. She has participated in pulmonary rehab.   She denies any fevers, chills, hemoptysis, productive sputum, night sweats, weight loss, abdominal pain, chest pain, or chest tightness.  She has gained weight over the past couple of years (between 15 and 20 pounds) and this has coincided with worsening symptoms.  Her other providers include hematology at [facility] where she is managed  for polycythemia vera.  She is also getting yearly CT scans for lung cancer screening at [facility].  Review of the record does note that the CT mentions emphysema but otherwise normal lung parenchyma.   She used to work at [employer] but has not done so for many years and she is currently on disability.  She used to smoke (3 packs a day for at least 20 years) but quit a year ago after her friend came down with cancer.  Ancillary information including prior medications, full medical/surgical/family/social histories, and PFTs (when available) are listed below and have been reviewed.   Review of Systems  Constitutional:  Negative for chills, fever and weight loss (weight gain).  Respiratory:  Positive for sputum production and shortness of breath. Negative for cough, hemoptysis and wheezing.   Cardiovascular:  Negative for chest pain.  Neurological:  Negative for tremors.     Objective:   Vitals:   07/08/23 0908  BP: 100/60  Pulse: 77  Resp: 16  Temp: 98.1 F (36.7 C)  TempSrc: Temporal  SpO2: 100%  Weight: 167 lb 12.8 oz (76.1 kg)  Height: 4\' 11"  (1.499 m)   100% on RA  BMI  Readings from Last 3 Encounters:  07/08/23 33.89 kg/m  06/23/23 33.12 kg/m  04/02/23 31.47 kg/m   Wt Readings from Last 3 Encounters:  07/08/23 167 lb 12.8 oz (76.1 kg)  06/23/23 164 lb (74.4 kg)  04/02/23 155 lb 12.8 oz (70.7 kg)    Physical Exam Constitutional:      Appearance: Normal appearance.  HENT:     Head: Normocephalic.     Mouth/Throat:     Mouth: Mucous membranes are moist.  Eyes:     Extraocular Movements: Extraocular movements intact.     Pupils: Pupils are equal, round, and reactive to light.  Cardiovascular:     Rate and Rhythm: Normal rate and regular rhythm.     Pulses: Normal pulses.     Heart sounds: Normal heart sounds.  Pulmonary:     Effort: Pulmonary effort is normal.     Breath sounds: Normal breath sounds. No wheezing.  Abdominal:     General: There is distension.     Palpations: Abdomen is soft.  Neurological:     General: No focal deficit present.     Mental Status: She is alert and oriented to person, place, and time. Mental status is at baseline.       Ancillary Information    Past Medical History:  Diagnosis Date   Abdominal pain    Abnormal mammogram    Anxiety    Asthma    Bleeding disorder (HCC)    Blood dyscrasia    polycythemia vera   Chronic kidney disease    COPD (chronic obstructive pulmonary disease) (HCC)    Cough    Depression    Diabetes mellitus without complication (HCC)    Fatigue    Headache 2000   Horizontal nystagmus    age 80   Hyperglycemia    Hypertension    Low back pain    Major depressive disorder    Nasal lesion    Neck pain    Snoring    SOB (shortness of breath)    Tobacco use    UTI (urinary tract infection)    Von Willebrand's disease (HCC)      Family History  Problem Relation Age of Onset   Cancer Father        prostate   Stroke  Father    Diabetes Father    Cancer Mother        bladder   Breast cancer Maternal Grandmother      Past Surgical History:  Procedure  Laterality Date   ABDOMINAL HYSTERECTOMY  2002   oophorectomy   ABDOMINAL HYSTERECTOMY     APPENDECTOMY     BLADDER SURGERY     BREAST BIOPSY Left    NEG   CARDIAC CATHETERIZATION     CARDIAC CATHETERIZATION N/A 07/17/2016   Procedure: Left Heart Cath and Coronary Angiography;  Surgeon: Lamar Blinks, MD;  Location: [facility] INVASIVE CV LAB;  Service: Cardiovascular;  Laterality: N/A;   COLONOSCOPY WITH PROPOFOL N/A 07/27/2015   Procedure: COLONOSCOPY WITH PROPOFOL;  Surgeon: Scot Jun, MD;  Location: [facility] ENDOSCOPY;  Service: Endoscopy;  Laterality: N/A;   COLONOSCOPY WITH PROPOFOL N/A 05/31/2019   Procedure: COLONOSCOPY WITH PROPOFOL;  Surgeon: Toney Reil, MD;  Location: [facility] ENDOSCOPY;  Service: Gastroenterology;  Laterality: N/A;   ESOPHAGOGASTRODUODENOSCOPY N/A 06/18/2017   Procedure: ESOPHAGOGASTRODUODENOSCOPY (EGD);  Surgeon: Toney Reil, MD;  Location: [facility] ENDOSCOPY;  Service: Gastroenterology;  Laterality: N/A;   ESOPHAGOGASTRODUODENOSCOPY (EGD) WITH PROPOFOL N/A 05/31/2019   Procedure: ESOPHAGOGASTRODUODENOSCOPY (EGD) WITH PROPOFOL;  Surgeon: Toney Reil, MD;  Location: [facility] ENDOSCOPY;  Service: Gastroenterology;  Laterality: N/A;   REPLACEMENT TOTAL KNEE Left    SHOULDER SURGERY Left    x 2    Social History   Socioeconomic History   Marital status: Single    Spouse name: Not on file   Number of children: Not on file   Years of education: Not on file   Highest education level: Not on file  Occupational History    Comment: disabled  Tobacco Use   Smoking status: Former    Current packs/day: 0.00    Average packs/day: 2.8 packs/day for 29.0 years (79.8 ttl pk-yrs)    Types: Cigarettes    Start date: 10/1991    Quit date: 10/2020    Years since quitting: 2.6   Smokeless tobacco: Never  Vaping Use   Vaping status: Never Used  Substance and Sexual Activity   Alcohol use: No    Alcohol/week: 0.0 standard drinks of alcohol   Drug use: No    Sexual activity: Not on file  Other Topics Concern   Not on file  Social History Narrative   single   Social Determinants of Health   Financial Resource Strain: Not on file  Food Insecurity: No Food Insecurity (10/16/2022)   Hunger Vital Sign    Worried About Running Out of Food in the Last Year: Never true    Ran Out of Food in the Last Year: Never true  Transportation Needs: No Transportation Needs (10/16/2022)   PRAPARE - Administrator, Civil Service (Medical): No    Lack of Transportation (Non-Medical): No  Physical Activity: Not on file  Stress: Not on file  Social Connections: Not on file  Intimate Partner Violence: Not At Risk (10/16/2022)   Humiliation, Afraid, Rape, and Kick questionnaire    Fear of Current or Ex-Partner: No    Emotionally Abused: No    Physically Abused: No    Sexually Abused: No     Allergies  Allergen Reactions   Aspirin    Pregabalin Other (See Comments)    Unsteady gate, causing falls   Trazodone Other (See Comments)    weakness   Tramadol Rash     CBC  Component Value Date/Time   WBC 8.5 06/23/2023 1112   RBC 4.42 06/23/2023 1112   HGB 13.2 06/23/2023 1112   HGB 11.5 (L) 02/22/2015 0851   HCT 39.7 06/23/2023 1112   HCT 35.9 02/22/2015 0851   PLT 426 (H) 06/23/2023 1112   PLT 454 (H) 02/22/2015 0851   MCV 89.8 06/23/2023 1112   MCV 63 (L) 02/22/2015 0851   MCH 29.9 06/23/2023 1112   MCHC 33.2 06/23/2023 1112   RDW 13.2 06/23/2023 1112   RDW 18.6 (H) 02/22/2015 0851   LYMPHSABS 0.6 (L) 10/15/2022 0531   LYMPHSABS 2.0 02/22/2015 0851   MONOABS 0.6 10/15/2022 0531   MONOABS 0.4 02/22/2015 0851   EOSABS 0.1 10/15/2022 0531   EOSABS 0.4 02/22/2015 0851   BASOSABS 0.1 10/15/2022 0531   BASOSABS 0.1 02/22/2015 0851    Pulmonary Functions Testing Results:    Latest Ref Rng & Units 07/25/2022    2:02 PM  PFT Results  FVC-Pre L 1.58   FVC-Predicted Pre % 56   FVC-Post L 1.83   FVC-Predicted Post % 64   Pre  FEV1/FVC % % 86   Post FEV1/FCV % % 82   FEV1-Pre L 1.35   FEV1-Predicted Pre % 61   FEV1-Post L 1.50   DLCO uncorrected ml/min/mmHg 14.12   DLCO UNC% % 81   DLVA Predicted % 93   TLC L 4.81   TLC % Predicted % 111   RV % Predicted % 142     Outpatient Medications Prior to Visit  Medication Sig Dispense Refill   albuterol (PROVENTIL) (2.5 MG/3ML) 0.083% nebulizer solution Take 3 mLs (2.5 mg total) by nebulization every 6 (six) hours as needed for wheezing or shortness of breath. 75 mL 12   albuterol (VENTOLIN HFA) 108 (90 Base) MCG/ACT inhaler Inhale into the lungs.     aspirin EC 81 MG tablet Take by mouth.     atorvastatin (LIPITOR) 20 MG tablet TAKE 1 TABLET BY MOUTH AT BEDTIME FOR HIGH CHOLESTEROL     cetirizine (ZYRTEC) 10 MG tablet TAKE 1 TABLET BY MOUTH DAILY FOR ALLERGIES     dextromethorphan-guaiFENesin (MUCINEX DM) 30-600 MG 12hr tablet Take 1 tablet by mouth 2 (two) times daily as needed for cough. 14 tablet 0   esomeprazole (NEXIUM) 40 MG capsule Take 40 mg by mouth daily.      fluticasone furoate-vilanterol (BREO ELLIPTA) 100-25 MCG/ACT AEPB Inhale 1 puff into the lungs daily. 30 each 11   folic acid (FOLVITE) 1 MG tablet Take 1 mg by mouth daily.     isosorbide mononitrate (IMDUR) 30 MG 24 hr tablet Take 30 mg by mouth daily.     Multiple Vitamins-Minerals (MULTIVIT/MULTIMINERAL ADULT PO) Take by mouth.     nicotine (NICODERM CQ - DOSED IN MG/24 HOURS) 21 mg/24hr patch Place 1 patch (21 mg total) onto the skin daily. 28 patch 0   NITROGLYCERIN SL Place 1 tablet under the tongue.     omeprazole (PRILOSEC OTC) 20 MG tablet Take 1 tablet (20 mg total) by mouth daily. 28 tablet 1   promethazine (PHENERGAN) 12.5 MG tablet Take 1 tablet (12.5 mg total) by mouth every 6 (six) hours as needed for nausea or vomiting. 20 tablet 0   propranolol (INDERAL) 40 MG tablet TAKE 1 TABLET TWICE DAILY TO PREVENT HEADACHES     REXULTI 0.5 MG TABS TAKE 1 TABLET BY MOUTH AT BEDTIME FOR 1 WEEK  THEN INCREASE TO 1 MG TABLET 67 tablet 1  venlafaxine XR (EFFEXOR-XR) 150 MG 24 hr capsule Take 2 capsules (300 mg total) by mouth every morning. 30 capsule 3   VRAYLAR 1.5 MG capsule Take 1 capsule (1.5 mg total) by mouth daily. 30 capsule 3   ferrous sulfate 325 (65 FE) MG tablet Take by mouth.     risperiDONE (RISPERDAL) 1 MG tablet Take 0.5 tablets (0.5 mg total) by mouth at bedtime for 7 days, THEN 1 tablet (1 mg total) at bedtime. 33.5 tablet 0   No facility-administered medications prior to visit.

## 2023-07-10 DIAGNOSIS — D45 Polycythemia vera: Principal | ICD-10-CM

## 2023-07-10 MED ORDER — JAKAFI 15 MG TABLET
ORAL_TABLET | Freq: Two times a day (BID) | ORAL | 11 refills | 30 days
Start: 2023-07-10 — End: 2024-07-04

## 2023-07-10 NOTE — Unmapped (Signed)
[facility] Clinical Assessment & Refill Coordination Note    Courtney Erickson, DOB: 02-26-1966  Phone: [phone] (home)     All above HIPAA information was verified with patient.     Was a Nurse, learning disability used for this call? No    Specialty Medication(s):   Hematology/Oncology: [id number]     Current Outpatient Medications   Medication Sig Dispense Refill    ADVAIR DISKUS 250-50 mcg/dose diskus Inhale 1 puff  in the morning and 1 puff in the evening.      albuterol HFA 90 mcg/actuation inhaler       aripiprazole (ABILIFY) 2 MG tablet Take 1 tablet (2 mg total) by mouth daily.      aspirin (ECOTRIN) 81 MG tablet Take 1 tablet (81 mg total) by mouth daily. 150 tablet 2    atorvastatin (LIPITOR) 20 MG tablet Take 1 tablet (20 mg total) by mouth daily. 30 tablet 11    BREO ELLIPTA 100-25 mcg/dose inhaler TAKE 1 PUFF BY MOUTH EVERY DAY      cetirizine (ZYRTEC) 10 MG tablet TAKE 1 TABLET BY MOUTH DAILY FOR ALLERGIES      cyclobenzaprine (FLEXERIL) 10 MG tablet TAKE 1/2-1 TABLET BY MOUTH EVERY EVENING FOR MUSCLE PAIN *30 DAY SUPPLY PER MD*      esomeprazole (NEXIUM) 40 MG capsule Take 1 capsule (40 mg total) by mouth daily.      estradiol (ESTRACE) 0.01 % (0.1 mg/gram) vaginal cream Insert 2 g into the vagina daily for 14 days, THEN 1 g Two (2) times a week. 42.5 g 1    ferrous sulfate 325 (65 FE) MG tablet TAKE 1 TABLET (325 MG TOTAL) BY MOUTH EVERY TUESDAY, THURSDAY, SATURDAY, SUNDAY. 30 tablet 3    ipratropium-albuterol (DUO-NEB) 0.5-2.5 mg/3 mL nebulizer Inhale 3 mL by nebulization every six (6) hours as needed. 360 mL 11    isosorbide mononitrate (ISMO,MONOKET) 10 MG tablet Take 1 tablet (10 mg total) by mouth two (2) times a day.      meloxicam (MOBIC) 7.5 MG tablet Take 1 tablet (7.5 mg total) by mouth daily.      mirtazapine (REMERON) 15 MG tablet TAKE 1 TABLET BY MOUTH EVERY DAY AT NIGHT 90 tablet 4    propranoloL (INDERAL) 40 MG tablet Take 1 tablet (40 mg total) by mouth in the morning.      ruxolitinib (JAKAFI) 15 mg tablet Take 1 tablet (15 mg total) by mouth Two (2) times a day. 60 tablet 11    sertraline (ZOLOFT) 25 MG tablet TAKE 1 TABLET BY MOUTH ONCE A DAY FOR ANXIETY, CAN INCREASE TO 2 TABS DAILY IN 2 WKS      SPIRIVA WITH HANDIHALER 18 mcg inhalation capsule       venlafaxine (EFFEXOR-XR) 150 MG 24 hr capsule TAKE 1 CAPSULE BY MOUTH DAILY. THEN INCREASE TO 2 CAPSULES BY MOUTH DAILY AFTER 2 WEEKS. 180 capsule 1     Current Facility-Administered Medications   Medication Dose Route Frequency Provider Last Rate Last Admin    lidocaine 2% gel (XYLOCAINE) jelly urojet 20 mL  20 mL Urethral Once Ivar Bury, MD            Changes to medications: Courtney Erickson reports no changes at this time.    Allergies   Allergen Reactions    Lyrica [Pregabalin] Other (See Comments)     Unsteady gate, causing falls    Trazodone      weakness    Gabapentin  Nausea And Vomiting    Tramadol Rash       Changes to allergies: No    SPECIALTY MEDICATION ADHERENCE     [name]: ~14 days of medicine on hand     Are there any concerns with adherence? No    Adherence counseling provided? Not needed    Patient-Reported Symptoms Tracker for Cancer Patients on Oral Chemotherapy     Oral chemotherapy medication name(s): Jakafi  Dose and frequency: 15 mg twice daily  Oral Chemotherapy Start Date:    Baseline? No  Clinic(s) visited: Hematology    Symptom Grouping Question Patient Response   Digestion and Eating Have you felt sick to your stomach? Denies    Had diarrhea? Denies    Constipated? Denies    Not wanting to eat? Denies    Comments      Sleep and Pain Felt very tired even after you rest? Denies    Pain due to cancer medication or cancer? Denies    Comments     Other Side Effects Numbness or tingling in hands and/or feet? Denies    Felt short of breath? Denies    Mouth or throat Sores? Denies    Rash? Denies    Palmar-plantar erythrodysesthesia syndrome?      Rash - acneiform?      Rash - maculo-papular?      How many days over the past month did your cancer medication or cancer keep you from your normal activities?  Write in number of days, 0-30:  0    Other side effects or things you would like to discuss?      Comments?     Adherence  In the last 30 days, on how many days did you miss at least one dose of any of your [drug name]? Write in number of days, 0-30:  0    What reasons are you having trouble taking your medication [pharmacist: check all that apply]? Specify chemotherapy cycle:        No problems identified    Comments:        Comments       Optional Symptom Tracking Comments:      CLINICAL MANAGEMENT AND INTERVENTION      Clinical Benefit Assessment:    Do you feel the medicine is effective or helping your condition? Yes    Clinical Benefit counseling provided? Not needed    Acute Infection Status:    Acute infections noted within Epic:  No active infections    Patient reported infection: None    Therapy Appropriateness:    Is therapy appropriate based on current medication list, adverse reactions, adherence, clinical benefit and progress toward achieving therapeutic goals?  Yes, therapy is appropriate and should be continued    DISEASE/MEDICATION-SPECIFIC INFORMATION      N/A    Is the patient receiving adequate infection prevention treatment? Not applicable    Does the patient have adequate nutritional support? Not applicable    PATIENT SPECIFIC NEEDS     Does the patient have any physical, cognitive, or cultural barriers? No    Is the patient high risk? No    Did the patient require a clinical intervention? No    Does the patient require physician intervention or other additional services (i.e., nutrition, smoking cessation, social work)? No    SOCIAL DETERMINANTS OF HEALTH     At the [facility], we have learned that life circumstances - like trouble affording food, housing, utilities,  or transportation can affect the health of many of our patients.   That is why we wanted to ask: are you currently experiencing any life circumstances that are negatively impacting your health and/or quality of life? Patient declined to answer    Social Determinants of Health     Financial Resource Strain: Not on file   Internet Connectivity: Not on file   Food Insecurity: No Food Insecurity (10/16/2022)    Received from [facility]    Hunger Vital Sign     Worried About Running Out of Food in the Last Year: Never true     Ran Out of Food in the Last Year: Never true   Tobacco Use: Medium Risk (05/26/2023)    Patient History     Smoking Tobacco Use: Former     Smokeless Tobacco Use: Never     Passive Exposure: Not on file   Housing/Utilities: Not on file   Alcohol Use: Not on file   Transportation Needs: No Transportation Needs (10/16/2022)    Received from [facility]    [facility] - Transportation     Lack of Transportation (Medical): No     Lack of Transportation (Non-Medical): No   Substance Use: Not on file   Health Literacy: Not on file   Physical Activity: Not on file   Interpersonal Safety: Unknown (07/10/2023)    Interpersonal Safety     Unsafe Where You Currently Live: Not on file     Physically Hurt by Anyone: Not on file     Abused by Anyone: Not on file   Stress: Not on file   Intimate Partner Violence: Not At Risk (10/16/2022)    Received from [facility], [facility]    Humiliation, Afraid, Rape, and Kick questionnaire     Fear of Current or Ex-Partner: No     Emotionally Abused: No     Physically Abused: No     Sexually Abused: No   Depression: Not at risk (01/01/2023)    PHQ-2     PHQ-2 Score: 0   Social Connections: Not on file       Would you be willing to receive help with any of the needs that you have identified today? Not applicable       SHIPPING     Specialty Medication(s) to be Shipped:   Hematology/Oncology: [name]    Other medication(s) to be shipped: No additional medications requested for fill at this time     Changes to insurance: No    Delivery Scheduled: Yes, Expected medication delivery date: 07/22/23.     Medication will be delivered via Next Day Courier to the confirmed prescription address in [facility].    The patient will receive a drug information handout for each medication shipped and additional FDA Medication Guides as required.  Verified that patient has previously received a Conservation officer, historic buildings and a Surveyor, mining.    The patient or caregiver noted above participated in the development of this care plan and knows that they can request review of or adjustments to the care plan at any time.      All of the patient's questions and concerns have been addressed.    Kermit Balo, [facility]   [facility] Specialty Pharmacist

## 2023-07-11 MED ORDER — RUXOLITINIB 15 MG TABLET
ORAL | 11 refills | 30 days | Status: CP
Start: 2023-07-11 — End: 2024-07-05
  Filled 2023-07-21: qty 60, 30d supply, fill #0

## 2023-08-14 NOTE — Unmapped (Signed)
[facility] Refill Coordination Note    Specialty Medication(s) to be Shipped:   Hematology/Oncology: Courtney Erickson    Other medication(s) to be shipped: No additional medications requested for fill at this time     Courtney Erickson, DOB: 01-13-1966  Phone: [phone] (home)       All above HIPAA information was verified with patient.     Was a Nurse, learning disability used for this call? No    Completed refill call assessment today to schedule patient's medication shipment from the [facility] [phone]).  All relevant notes have been reviewed.     Specialty medication(s) and dose(s) confirmed: Regimen is correct and unchanged.   Changes to medications: Courtney Erickson reports no changes at this time.  Changes to insurance: No  New side effects reported not previously addressed with a pharmacist or physician: None reported  Questions for the pharmacist: No    Confirmed patient received a Conservation officer, historic buildings and a Surveyor, mining with first shipment. The patient will receive a drug information handout for each medication shipped and additional FDA Medication Guides as required.       DISEASE/MEDICATION-SPECIFIC INFORMATION        N/A    SPECIALTY MEDICATION ADHERENCE     Medication Adherence    Patient reported X missed doses in the last month: 0  Specialty Medication: [name]  Patient is on additional specialty medications: No  Informant: patient     Were doses missed due to medication being on hold? No    Jakafi 15mg : 7 days of medicine on hand       REFERRAL TO PHARMACIST     Referral to the pharmacist: Not needed      [facility]     Shipping address confirmed in Epic.     Delivery Scheduled: Yes, Expected medication delivery date: 08/19/23.     Medication will be delivered via Next Day Courier to the prescription address in Epic [place].    Courtney Erickson   [facility] Specialty Technician

## 2023-08-18 MED FILL — JAKAFI 15 MG TABLET: ORAL | 30 days supply | Qty: 60 | Fill #1

## 2023-09-10 NOTE — Unmapped (Signed)
[facility] Pharm[facility] Refill Coordination Note    Specialty Medication(s) to be Shipped:   Hematology/Oncology: Courtney Erickson    Other medication(s) to be shipped: No additional medications requested for fill at this time     Courtney Erickson, DOB: [birth year]  Phone: [phone] (home)       All above HIPAA information was verified with patient.     Was a Nurse, learning disability used for this call? No    Completed refill call assessment today to schedule patient's medication shipment from the [facility] [phone]).  All relevant notes have been reviewed.     Specialty medication(s) and dose(s) confirmed: Regimen is correct and unchanged.   Changes to medications: Courtney Erickson reports no changes at this time.  Changes to insurance: No  New side effects reported not previously addressed with a pharmacist or physician: None reported  Questions for the pharmacist: No    Confirmed patient received a Conservation officer, historic buildings and a Surveyor, mining with first shipment. The patient will receive a drug information handout for each medication shipped and additional FDA Medication Guides as required.       DISEASE/MEDICATION-SPECIFIC INFORMATION        N/A    SPECIALTY MEDICATION ADHERENCE     Medication Adherence    Patient reported X missed doses in the last month: 0  Specialty Medication: [name]  Patient is on additional specialty medications: No  Informant: patient     Were doses missed due to medication being on hold? No    Jakafi 15mg : 11 days of medicine on hand       REFERRAL TO PHARMACIST     Referral to the pharmacist: Not needed      [facility]     Shipping address confirmed in Epic.     Delivery Scheduled: Yes, Expected medication delivery date: 09/18/23.     Medication will be delivered via Next Day Courier to the prescription address in Epic [place].    Courtney Erickson   [facility] Specialty Technician

## 2023-09-17 MED FILL — JAKAFI 15 MG TABLET: ORAL | 30 days supply | Qty: 60 | Fill #2

## 2023-10-14 NOTE — Unmapped (Signed)
[facility] Pharm[facility] Refill Coordination Note    Specialty Medication(s) to be Shipped:   Hematology/Oncology: Courtney Erickson    Other medication(s) to be shipped: No additional medications requested for fill at this time     Courtney Erickson, DOB: 1966/01/16  Phone: [phone] (home)       All above HIPAA information was verified with patient.     Was a Nurse, learning disability used for this call? No    Completed refill call assessment today to schedule patient's medication shipment from the [facility] [phone]).  All relevant notes have been reviewed.     Specialty medication(s) and dose(s) confirmed: Regimen is correct and unchanged.   Changes to medications: [name] reports no changes at this time.  Changes to insurance: No  New side effects reported not previously addressed with a pharmacist or physician: None reported  Questions for the pharmacist: No    Confirmed patient received a Conservation officer, historic buildings and a Surveyor, mining with first shipment. The patient will receive a drug information handout for each medication shipped and additional FDA Medication Guides as required.       DISEASE/MEDICATION-SPECIFIC INFORMATION        N/A    SPECIALTY MEDICATION ADHERENCE     Medication Adherence    Patient reported X missed doses in the last month: 0  Specialty Medication: [name]  Patient is on additional specialty medications: No  Informant: patient     Were doses missed due to medication being on hold? No    Jakafi 15mg : 5 days of medicine on hand       REFERRAL TO PHARMACIST     Referral to the pharmacist: Not needed      [facility]     Shipping address confirmed in Epic.     Delivery Scheduled: Yes, Expected medication delivery date: 10/17/23.     Medication will be delivered via Next Day Courier to the prescription address in Epic [place].    Courtney Erickson   [facility] Specialty Technician

## 2023-10-16 MED FILL — JAKAFI 15 MG TABLET: ORAL | 30 days supply | Qty: 60 | Fill #3

## 2023-10-27 ENCOUNTER — Encounter: Payer: Self-pay | Admitting: Family Medicine

## 2023-10-27 DIAGNOSIS — Z1231 Encounter for screening mammogram for malignant neoplasm of breast: Secondary | ICD-10-CM

## 2023-10-30 DIAGNOSIS — N958 Other specified menopausal and perimenopausal disorders: Principal | ICD-10-CM

## 2023-10-30 MED ORDER — ESTRADIOL 0.01% (0.1 MG/GRAM) VAGINAL CREAM
VAGINAL | 1 refills | 64.00 days | Status: CP
Start: 2023-10-30 — End: 2024-05-31

## 2023-10-31 ENCOUNTER — Other Ambulatory Visit: Payer: Self-pay | Admitting: Family Medicine

## 2023-10-31 DIAGNOSIS — Z1231 Encounter for screening mammogram for malignant neoplasm of breast: Secondary | ICD-10-CM

## 2023-11-04 MED ORDER — MIRTAZAPINE 15 MG TABLET
ORAL_TABLET | Freq: Every evening | ORAL | 4 refills | 0.00 days
Start: 2023-11-04 — End: ?

## 2023-11-04 NOTE — Unmapped (Signed)
Pt is requesting refill    Most recent clinic visit: 05/21/2023  Next clinic visit:  11/19/2023

## 2023-11-05 MED ORDER — MIRTAZAPINE 15 MG TABLET
ORAL_TABLET | Freq: Every evening | ORAL | 4 refills | 90.00 days | Status: CP
Start: 2023-11-05 — End: ?

## 2023-11-07 NOTE — Unmapped (Signed)
Courtney Erickson    Specialty Medication(s) to be Shipped:   Hematology/Oncology: [name]    Other medication(s) to be shipped: No additional medications requested for fill at this time     Courtney Erickson, DOB: Sep 01, 1966  Phone: [phone] (home)       All above HIPAA information was verified with patient.     Was a Nurse, learning disability used for this call? No    Completed refill call assessment today to schedule patient's medication shipment from the [facility]  [phone]).  All relevant notes have been reviewed.     Specialty medication(s) and dose(s) confirmed: Regimen is correct and unchanged.   Changes to medications: Vicki reports no changes at this time.  Changes to insurance: No  New side effects reported not previously addressed with a pharmacist or physician: None reported  Questions for the pharmacist: No    Confirmed patient received a Conservation officer, historic buildings and a Surveyor, mining with first shipment. The patient will receive a drug information handout for each medication shipped and additional FDA Medication Guides as required.       DISEASE/MEDICATION-SPECIFIC INFORMATION        N/A    SPECIALTY MEDICATION ADHERENCE     Medication Adherence    Patient reported X missed doses in the last month: 0  Specialty Medication: [name]  Patient is on additional specialty medications: No  Informant: patient  Confirmed plan for next specialty medication refill: delivery by pharmacy          Refill Coordination    Has the Patients' Contact Information Changed: No  Is the Shipping Address Different: No         Were doses missed due to medication being on hold? No    [name]: 10 days of medicine on hand     REFERRAL TO PHARMACIST     Referral to the pharmacist: Not needed      [facility]     Shipping address confirmed in Epic.       Delivery Scheduled: Yes, Expected medication delivery date: 1/15.     Medication will be delivered via UPS to the prescription address in Epic [place].    Norva Karvonen   [facility]  Specialty Pharmacist

## 2023-11-11 MED FILL — JAKAFI 15 MG TABLET: ORAL | 30 days supply | Qty: 60 | Fill #4

## 2023-11-19 NOTE — Unmapped (Signed)
[facility] Other Call ONC Phone Room Smart Lists: Cancellation/Reschedule -     Hi,    Patient Courtney Erickson contacted the Communication Center to reschedule their appointment for today.  The original appointment has been cancelled.    Cancellation Reason: Weather    Patient has been rescheduled for 3/4.    Thank you,  Yehuda Mao  [facility]   [phone]    [facility]

## 2023-12-09 NOTE — Unmapped (Signed)
Courtney Erickson [facility] Pharm[facility] Refill Coordination Note    Specialty Medication(s) to be Shipped:   Hematology/Oncology: Courtney Erickson    Other medication(s) to be shipped: No additional medications requested for fill at this time     Courtney Erickson, DOB: December 05, 1965  Phone: [phone] (home)       All above HIPAA information was verified with patient.     Was a Nurse, learning disability used for this call? No    Completed refill call assessment today to schedule patient's medication shipment from the [facility]  [phone]).  All relevant notes have been reviewed.     Specialty medication(s) and dose(s) confirmed: Regimen is correct and unchanged.   Changes to medications: [name] reports no changes at this time.  Changes to insurance: No  New side effects reported not previously addressed with a pharmacist or physician: None reported  Questions for the pharmacist: No    Confirmed patient received a Conservation officer, historic buildings and a Surveyor, mining with first shipment. The patient will receive a drug information handout for each medication shipped and additional FDA Medication Guides as required.       DISEASE/MEDICATION-SPECIFIC INFORMATION        N/A    SPECIALTY MEDICATION ADHERENCE     Medication Adherence    Patient reported X missed doses in the last month: 0  Specialty Medication: [name]  Patient is on additional specialty medications: No  Informant: patient     Were doses missed due to medication being on hold? No    [name]: 10 days of medicine on hand       REFERRAL TO PHARMACIST     Referral to the pharmacist: Not needed      [facility]     Shipping address confirmed in Epic.       Delivery Scheduled: Yes, Expected medication delivery date: 12/16/23.     Medication will be delivered via Next Day Courier to the prescription address in Epic [place].    Courtney Erickson   [facility] Delivery [facility]  Specialty Technician

## 2023-12-15 MED FILL — JAKAFI 15 MG TABLET: ORAL | 30 days supply | Qty: 60 | Fill #5

## 2023-12-30 ENCOUNTER — Ambulatory Visit
Admit: 2023-12-30 | Discharge: 2023-12-31 | Payer: MEDICARE | Attending: Hematology & Oncology | Primary: Hematology & Oncology

## 2023-12-30 ENCOUNTER — Other Ambulatory Visit: Admit: 2023-12-30 | Discharge: 2023-12-31 | Payer: MEDICARE

## 2023-12-30 DIAGNOSIS — E785 Hyperlipidemia, unspecified: Principal | ICD-10-CM

## 2023-12-30 DIAGNOSIS — D45 Polycythemia vera: Principal | ICD-10-CM

## 2023-12-30 LAB — CBC W/ AUTO DIFF
BASOPHILS ABSOLUTE COUNT: 0.1 10*9/L (ref 0.0–0.1)
BASOPHILS RELATIVE PERCENT: 1 %
EOSINOPHILS ABSOLUTE COUNT: 0.2 10*9/L (ref 0.0–0.5)
EOSINOPHILS RELATIVE PERCENT: 2.3 %
HEMATOCRIT: 37.9 % (ref 34.0–44.0)
HEMOGLOBIN: 13.1 g/dL (ref 11.3–14.9)
LYMPHOCYTES ABSOLUTE COUNT: 1.7 10*9/L (ref 1.1–3.6)
LYMPHOCYTES RELATIVE PERCENT: 24.4 %
MEAN CORPUSCULAR HEMOGLOBIN CONC: 34.6 g/dL (ref 32.0–36.0)
MEAN CORPUSCULAR HEMOGLOBIN: 30.2 pg (ref 25.9–32.4)
MEAN CORPUSCULAR VOLUME: 87.4 fL (ref 77.6–95.7)
MEAN PLATELET VOLUME: 9.4 fL (ref 6.8–10.7)
MONOCYTES ABSOLUTE COUNT: 0.6 10*9/L (ref 0.3–0.8)
MONOCYTES RELATIVE PERCENT: 8.8 %
NEUTROPHILS ABSOLUTE COUNT: 4.3 10*9/L (ref 1.8–7.8)
NEUTROPHILS RELATIVE PERCENT: 63.5 %
PLATELET COUNT: 353 10*9/L (ref 150–450)
RED BLOOD CELL COUNT: 4.34 10*12/L (ref 3.95–5.13)
RED CELL DISTRIBUTION WIDTH: 13.9 % (ref 12.2–15.2)
WBC ADJUSTED: 6.8 10*9/L (ref 3.6–11.2)

## 2023-12-30 LAB — COMPREHENSIVE METABOLIC PANEL
ALBUMIN: 4.2 g/dL (ref 3.4–5.0)
ALKALINE PHOSPHATASE: 73 U/L (ref 46–116)
ALT (SGPT): 41 U/L (ref 10–49)
ANION GAP: 10 mmol/L (ref 5–14)
AST (SGOT): 72 U/L — ABNORMAL HIGH (ref <=34)
BILIRUBIN TOTAL: 0.6 mg/dL (ref 0.3–1.2)
BLOOD UREA NITROGEN: 10 mg/dL (ref 9–23)
BUN / CREAT RATIO: 14
CALCIUM: 9.4 mg/dL (ref 8.7–10.4)
CHLORIDE: 102 mmol/L (ref 98–107)
CO2: 28 mmol/L (ref 20.0–31.0)
CREATININE: 0.69 mg/dL (ref 0.55–1.02)
EGFR CKD-EPI (2021) FEMALE: >90 mL/min/{1.73_m2} (ref >=60)
GLUCOSE RANDOM: 116 mg/dL (ref 70–179)
POTASSIUM: 4.2 mmol/L (ref 3.4–4.8)
PROTEIN TOTAL: 6.9 g/dL (ref 5.7–8.2)
SODIUM: 140 mmol/L (ref 135–145)

## 2023-12-30 LAB — LACTATE DEHYDROGENASE: LACTATE DEHYDROGENASE: 184 U/L (ref 120–246)

## 2023-12-30 LAB — LDL CHOLESTEROL, DIRECT: LDL CHOLESTEROL DIRECT: 87 mg/dL

## 2023-12-30 NOTE — Unmapped (Signed)
 ID: Courtney Erickson is a 58 y.o. 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 This treatment controlled her counts, but did not relieve many of the associated MPN symptoms.  For this reason, she was started on ruxolitinib.  At 15 mg BID, she has had excellent control of her counts.  However, it is less clear if she has had improvement in her symptoms.  Initially, I thought this was the case, but now I am having some doubts.     Since switching to ruxolitnib, there has been a weight gain of about 20 lbs.  She has actually returned to her weight noted in 2016.  Thus, it may be difficult to attribute this weight to ruxolitinib.  At this point, the best thing to do is to switch her back to [facility] and follow her course over the next 6 months. If she had a flare in her MPN symptoms, we can try fedratinib.      We talked about her sleep deficit at length.  Correcting her sleep issues will likely help with her energy and feeling of well being.  She did talk about doing well on Vivanse.  There is no contraindication to this drug in terms of her blood condition or its treatment.      PLAN:   1) She was referred to her PCP about her sleep problems and weight loss.   2) Restart Hydrea at 1,000 mg every day.   3) Reduce ruxoitinib to one pill a day.  Overlap with Hydrea for one week   4) Labs at 6 weeks  5) RTC in 3 months.  At that time we will assess her PCV symptoms.  If they are worse, we will switch to fedratinib.   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%    09/2015: Transitioned to [facility]; held when platelets normalized; (177 lbd)  05/2019: Normal colonoscopy; Grade I reflux (140 lbs)  09/2019: CBC: 15.8/14.0/596; Restarted Hydrea 500 mg every day   10/2019: 12.7/13.2/591; Increased Hydrea to 1000 mg every day   05/04/20: 5.5/12.8/218; CD34: 0.04  08/07/20:   Variants of Known/Likely Clinical Significance:   Gene Coding Predicted Protein Variant allele fraction   JAK2 c.1849G>T p.(Val617Phe) 21.9 %   PTPN11 c.178G>A p.(Gly60Ser) 48.5 %     11/09/20: 5.6/13.2/279; (148 lbs) (MPN 43)  11/24/20: Began Ruxolitinib 10 mg BID; DC'd hydrea  12/14/20: Inc ruxolitinib 15 mg BID  03/22/21: 6.5/12.2/344  11/26/21: 8.6/11.3/395 ANC 5.6  01/24/22: 13.2/9.6/591; ferritin: 5.3  07/02/22: 12,2/13.0/38.6/419; (160 lb) (MPN 45)  Ferritin: 23.3  AST: 68; ALT: 49  LDL: 50; TG 317  10/02/22: 2.9/56.2/13.0/865H; ferritin 79.2; MPN Score: 38  01/01/23: 15.4/13.8/40/517 (just finished prednisone course) ferritin 72  12/30/23: 6.8/13.1/353 (169  lb)  AST: 72  LDL: 87.0  LDH: 184  History of Present Illness  Courtney Erickson is a 58 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    She has a history of polycythemia vera and was previously on Hydrea but switched to [facility], which has helped control her blood counts but not her symptoms. Her last LDL cholesterol check in September showed a level of 50, likely while on Lipitor, which she discontinued two or three months ago.    Two days ago, she used a wax remover to clean her ears, and the following day, she noticed blood in her ear. She still has dry blood in it and experiences itching and pain, although she can hear from it. Tugging on the ear does not cause pain.    She experiences persistent fatigue, rating it as 8 to 10 out of 10. She feels tired all the time and reports that her fatigue has not improved despite medication changes. She also mentions feeling depressed and crying for no reason, but denies any suicidal thoughts.    She reports significant sleep disturbances, going to bed around 10 or 11 PM, tossing and turning until about midnight, and then waking up around 1 or 1:30 AM. She remains awake until about 4 AM when she gets up. She has not been diagnosed with sleep apnea but snores and wakes up feeling tired. She drinks coffee in the morning but does not smoke or consume alcohol.    She has a history of ADHD and was previously on Vyvanse, which she found helpful. However, she is no longer on it due to difficulties with regular doctor visits. Currently, she is on bupropion 150 mg once daily, Buspar, propranolol, Abilify, meloxicam, isosorbide, Jakafi, Estrace, and Nexium. She is no longer taking Zoloft, Zyrtec, Flexeril, Effexor, or Lipitor.    She reports weight gain over the past few years, from 105 pounds to 169 pounds, and attributes it to lack of activity and possibly medication.      MPN 10 Score  (App: [email or web address])   [email or web address]  (Useful for monitoring patient symptoms.    Symptom Range 7/21 1/22 2/17  5/26 08/27/21 04/08/22 06/2022 09/2022   Fatigue in the past 24 hours (Absent) 0 1 2 3 4 5 6 7 8 9  10 (Worst Imaginable) 10 10 10+ 10 10 10  8   8    Filling up quickly when you eat (Early satiety)  (Absent) 0 1 2 3 4 5 6 7 8 9  10 (Worst Imaginable) 0 0 0 0 0 5 9   10    Abdominal discomfort  (Absent) 0 1 2 3 4 5 6 7 8 9  10 (Worst Imaginable) 0 0 0 0 0 0 0 5   Inactivity (Absent) 0 1 2 3 4 5 6 7 8 9  10 (Worst Imaginable) 8 8 8 10 5 5 10 10    Problems with concentration - (Absent) 0 1 2 3 4 5 6 7 8 9  10 (Worst Imaginable) 5 8 5 10  0 10 0 5   Numbness/ Tingling (in my hands and feet) (Absent) 0 1 2 3 4 5 6 7 8 9  10 (Worst Imaginable) 3 3 3 3 7  0 0 0 Night sweats (Absent) 0 1 2 3 4 5 6 7 8 9  10 (Worst Imaginable) 8 8 0 3 8 10  Breaks out in sweat - 8 10   Itching (pruritus) (Absent) 0 1 2 3 4  5  6 7 8 9 10  (Worst Imaginable) 4  Night itch  aquagenic pruritus;  4 0  Sleep is better  3 0 0 10    0   Bone pain (diffuse not joint pain or arthritis) (Absent) 0 1 2 3 4 5 6 7 8 9  10 (Worst Imaginable) 3 Severe but infreq  2   0 0 0 5 0 0   Fever (>100 F) (Absent) 0 1 2 3 4 5 6 7 8 9  10 (Daily) 0 0 0 0 0 0 0 0   Unintentional weight loss last 6 months (Absent) 0 1 2 3 4 5 6 7 8 9  10 (Worst Imaginable) 0 0 0 0 0 0 0 0   Total  41 43 26 42 30 45 45 38     PHYSICAL EXAM:  VS: As recorded in Epic  GENERAL: She appears reasonably well   HEENT: OP is clear  LYMPH NODES: No LAN  LUNGS: CTA  COR:RRR  ABD: NTND;   EXT: No edema    PMHx:    VWD: Responsive to DDAVP (2005)  H/O bleeding with childbirth, dental extractions, menstrual bleeding  VWF measured as low as 20% and as high as 84%  Responds to DDAVP  Txed with Humate P75 U/kg BID for 5 days  Lung cancer screening   12/19/17: Pulmonary nodules: Stable, Lung-RADS Category 2.  01/18/19: LD CT Chest: Few unchanged subcentimeter pulmonary nodules. No new nodule.   Lung-RADS: 2  Severe cervical spondylosis (9/20)  Colonoscopy (2016): single polyp  Bladder disease: atony, chronic interstitial cystitis, with implanted bladder stimulator  S/P hysterectomy for endometriosis  Psychiatric disease, stated history of major depression; h/o Abilify, Wellbutrin, BuSpar, Adderall, Valium; on Latuda, gabapentin, Topamax, Viibryd, Abilify (txed by [facility]  GERD, gastric ulcer, epigastric pain, with negative EGD and EUS for pancreatic disease (8/20)  Mammogram (01/2017): BI-RADS 2, 1 year follow-up recommended/scheduled  Nl Cardiac Cath (9/17)   COPD;  smoker (30 pk yr), on Advair and DuoNeb;   PFTs (12/15)  FVC was 2.27 liters, 79% of predicted  FEV1 was 1.86, 78% of predicted  FEV1 ratio was 82  FEF 25-75% liters per second was 88% of predicted    LUNG VOLUMES:  TLC was 71% of predicted  RV was 55% of predicted    DIFFUSION CAPACITY:  DLCO was 63% of predicted  DLCO/VA was 123% of predicted    FLOW VOLUME LOOP:  ? Expiratory flow volume is somewhat flattened

## 2023-12-30 NOTE — Unmapped (Addendum)
 PLAN:   1) I would talk your PCP about sleep and weight loss  2) I will look into switching fedratinib. Your blood counts were controlled on hydrea.  We switched because of the symptoms you were having.  These may be due to PCV.  I will talk about your case this Friday.   3) Get a sleep test.      SUMMARY:  1) Your blood counts are perfect.  Your PCV is beautifully controlled.   2) Vivanse is a reasonable medicine  3) You are sleep deprived.  You need 6 to 8 hours of uninterrupted sleep.      Because you have polycythemia vera, you are at a higher risk of cardiovascular disease (heart attack, stroke).  The reason is the JAK2 mutation has effects on blood vessels.      What to do?   - Control your lipids (cholesterol); we like an LDL (bad cholesterol) to be < 70. Yours was 50 in September.  This may be because you were on Lipitor.    - If you tolerate it, I would take it.  I sent another LDL cholesterol.  IF this is elevated, then we will call you and you can start.     There is no contraindication to Vivanse.      Generally, the light box should:  Provide an exposure to 10,000 lux of light  Produce as little UV light as possible  Typical recommendations include using the light box:  Within the first hour of waking up in the morning  For about 20 to 30 minutes  About 16 to 24 inches (41 to 61 centimeters) from your face, but follow the manufacturer's instructions about distance  With eyes open, but not looking directly at the light    Steps to strengthen internal body rhythms  Limit the naps to before 3 pm and no longer than 20-25 minutes   Walk outside in the sunlight in the morning and early afternoon    Do not use a snooze alarm.   Avoid screens (TVs, computers, etc) by 10 PM  Make sure the alarm clock is NOT visible from the bed   Use the bed for sleeping and sex only   If you are thinking about something, get out of bed and write it down. You want to train your body that the bed means sleep.   Melatonin, Benadryl  You should get a sleep study - you are at risk for sleep apnea     What to avoid   Do not have drinks with caffeine, such as coffee or black tea, for 8 hours before bed.  Do not smoke or use other types of tobacco near bedtime. Nicotine is a stimulant and can keep you awake.  Avoid drinking alcohol late in the evening, because it can cause you to wake in the middle of the night.  Do not eat a big meal close to bedtime. If you are hungry, eat a light snack.  Do not drink a lot of water close to bedtime, because the need to urinate may wake you up during the night.  Do not read or watch TV in bed. Use the bed only for sleeping and sexual activity.    What to try   Go to bed at the same time every night, and wake up at the same time every morning. Limit naps to 30 min   Keep your bedroom quiet, dark, and cool.  Get regular exercise, but not  within 3 to 4 hours of your bedtime.  Sleep on a comfortable pillow and mattress.  If watching the clock makes you anxious, turn it facing away from you so you cannot see the time.  If you worry when you lie down, start a worry book. Well before bedtime, write down your worries, and then set the book and your concerns aside.  Try meditation or other relaxation techniques before you go to bed.  If you cannot fall asleep, get up and go to another room until you feel sleepy. Do something relaxing. Repeat your bedtime routine before you go to bed again.  Make your house quiet and calm about an hour before bedtime. Turn down the lights, turn off the TV, log off the computer, and turn down the volume on music. This can help you relax after a busy day.    All lab results last 24 hours:    Recent Results (from the past 24 hours)   Comprehensive Metabolic Panel    Collection Time: 12/30/23  7:21 AM   Result Value Ref Range    Sodium 140 135 - 145 mmol/L    Potassium 4.2 3.4 - 4.8 mmol/L    Chloride 102 98 - 107 mmol/L    CO2 28.0 20.0 - 31.0 mmol/L    Anion Gap 10 5 - 14 mmol/L BUN 10 9 - 23 mg/dL    Creatinine 1.61 0.96 - 1.02 mg/dL    BUN/Creatinine Ratio 14     eGFR CKD-EPI (2021) Female >90 >=60 mL/min/1.36m2    Glucose 116 70 - 179 mg/dL    Calcium 9.4 8.7 - 04.5 mg/dL    Albumin 4.2 3.4 - 5.0 g/dL    Total Protein 6.9 5.7 - 8.2 g/dL    Total Bilirubin 0.6 0.3 - 1.2 mg/dL    AST 72 (H) <=40 U/L    ALT 41 10 - 49 U/L    Alkaline Phosphatase 73 46 - 116 U/L   Lactate dehydrogenase    Collection Time: 12/30/23  7:21 AM   Result Value Ref Range    LDH 184 120 - 246 U/L This number can increase if the PCV progresses.    CBC w/ Differential    Collection Time: 12/30/23  7:21 AM   Result Value Ref Range    WBC 6.8 3.6 - 11.2 10*9/L    RBC 4.34 3.95 - 5.13 10*12/L    HGB 13.1 11.3 - 14.9 g/dL    HCT 98.1 19.1 - 47.8 % We like < 42.5    MCV 87.4 77.6 - 95.7 fL    MCH 30.2 25.9 - 32.4 pg    MCHC 34.6 32.0 - 36.0 g/dL    RDW 29.5 62.1 - 30.8 %    MPV 9.4 6.8 - 10.7 fL    Platelet 353 150 - 450 10*9/L We like < 600    Neutrophils % 63.5 %    Lymphocytes % 24.4 %    Monocytes % 8.8 %    Eosinophils % 2.3 %    Basophils % 1.0 %    Absolute Neutrophils 4.3 1.8 - 7.8 10*9/L    Absolute Lymphocytes 1.7 1.1 - 3.6 10*9/L    Absolute Monocytes 0.6 0.3 - 0.8 10*9/L    Absolute Eosinophils 0.2 0.0 - 0.5 10*9/L    Absolute Basophils 0.1 0.0 - 0.1 10*9/L

## 2024-01-01 ENCOUNTER — Ambulatory Visit
Admission: RE | Admit: 2024-01-01 | Discharge: 2024-01-01 | Disposition: A | Discharge status: Home / Self Care | Payer: 59 | Source: Ambulatory Visit | Attending: Family Medicine | Admitting: Family Medicine

## 2024-01-01 DIAGNOSIS — Z1231 Encounter for screening mammogram for malignant neoplasm of breast: Secondary | ICD-10-CM | POA: Diagnosis present

## 2024-01-04 MED ORDER — HYDROXYUREA 500 MG CAPSULE
ORAL_CAPSULE | Freq: Every day | ORAL | 5 refills | 30 days | Status: CP
Start: 2024-01-04 — End: 2024-07-02
  Filled 2024-01-09: qty 60, 30d supply, fill #0

## 2024-01-04 NOTE — Unmapped (Signed)
 I would like to move Courtney Erickson to [facility] given the weight gain problems that she has had on ruxolitinib.  I put her back on the dose she was originally on.      Here is the plan:   1) She was referred to her PCP about her sleep problems and weight loss.   2) Restart Hydrea at 1,000 mg every day.   I have sent a script [facility]   3) Reduce ruxoitinib to one pill a day.  Overlap with Hydrea for one week   She may need some help with these instructions  Sheh-Li - this was the easiest way to wean off ruxolitinib.  Let me know if I need to do more.    4) Labs at 6 weeks  We will need to follow-up on these labs  5) RTC in 3 months.    At that time we will assess her PCV symptoms.  If they are worse, we will switch to fedratinib.   6) Continue ASA 81mg  daily for now     Here is the note that I sent her    Courtney Erickson,     As we talked about, your [name] has controlled your blood counts quite well, but it does not appear to have had much effect on your symptoms.  [name] may have added to your weight.  This is a rare but known side effect.      I would return to Hydrea 2 tablets (or 1,000 mg) a day.  This is the dose that controlled your counts in the past.  We can see how you do over the next several months. If you think that you were better off on ruxolitinib, we can try fedratinib.   Otherwise, it is important to know that you prognosis is not different on ruxolitinib compared to Hydrea.     I have sent in a script for Hydrea.  They should send it to you. I would do the following:     1) Take ruxolitinib only once a day starting tomorrow.   2) Begin Hydrea 2 pills once a day.  They will send it do you.   3) Take ruxolitinib once a day and Hydrea for 7 days.   4) After 7 days, just take Hydrea   5) Get labs drawn in 6 weeks.   6) Return to clinic in 3 months.     I will have Candise Bowens call you and go over these instructions.      Contact us if you have problems     Dr Zenaida Niece

## 2024-01-06 NOTE — Unmapped (Signed)
 [facility] Specialty Medication Onboarding    Specialty Medication: hydroxyurea 500 mg capsule (HYDREA)  Prior Authorization: Not Required   Financial Assistance: No - copay  <$25  Final Copay/Day Supply: $0 / 30 days    Insurance Restrictions: None     Notes to Pharmacist: none  Credit Card on File: no  Start Date on Rx:  03/09/025    The triage team has completed the benefits investigation and has determined that the patient is able to fill this medication at [facility]. Please contact the patient to complete the onboarding or follow up with the prescribing physician as needed.

## 2024-01-07 ENCOUNTER — Other Ambulatory Visit: Payer: Self-pay

## 2024-01-07 ENCOUNTER — Emergency Department

## 2024-01-07 ENCOUNTER — Observation Stay
Admission: EM | Admit: 2024-01-07 | Discharge: 2024-01-10 | Disposition: A | Discharge status: Home / Self Care | Attending: Internal Medicine | Admitting: Internal Medicine

## 2024-01-07 DIAGNOSIS — K219 Gastro-esophageal reflux disease without esophagitis: Secondary | ICD-10-CM | POA: Diagnosis present

## 2024-01-07 DIAGNOSIS — E785 Hyperlipidemia, unspecified: Secondary | ICD-10-CM | POA: Diagnosis not present

## 2024-01-07 DIAGNOSIS — Z96652 Presence of left artificial knee joint: Secondary | ICD-10-CM | POA: Insufficient documentation

## 2024-01-07 DIAGNOSIS — J441 Chronic obstructive pulmonary disease with (acute) exacerbation: Principal | ICD-10-CM | POA: Insufficient documentation

## 2024-01-07 DIAGNOSIS — I129 Hypertensive chronic kidney disease with stage 1 through stage 4 chronic kidney disease, or unspecified chronic kidney disease: Secondary | ICD-10-CM | POA: Insufficient documentation

## 2024-01-07 DIAGNOSIS — Z7982 Long term (current) use of aspirin: Secondary | ICD-10-CM | POA: Diagnosis not present

## 2024-01-07 DIAGNOSIS — N189 Chronic kidney disease, unspecified: Secondary | ICD-10-CM | POA: Diagnosis not present

## 2024-01-07 DIAGNOSIS — E1122 Type 2 diabetes mellitus with diabetic chronic kidney disease: Secondary | ICD-10-CM | POA: Insufficient documentation

## 2024-01-07 DIAGNOSIS — D6800 Von Willebrand disease, unspecified: Secondary | ICD-10-CM | POA: Diagnosis present

## 2024-01-07 DIAGNOSIS — J9601 Acute respiratory failure with hypoxia: Principal | ICD-10-CM | POA: Diagnosis present

## 2024-01-07 DIAGNOSIS — F418 Other specified anxiety disorders: Secondary | ICD-10-CM | POA: Diagnosis present

## 2024-01-07 DIAGNOSIS — I10 Essential (primary) hypertension: Secondary | ICD-10-CM | POA: Diagnosis present

## 2024-01-07 DIAGNOSIS — Z79899 Other long term (current) drug therapy: Secondary | ICD-10-CM | POA: Insufficient documentation

## 2024-01-07 DIAGNOSIS — Z87891 Personal history of nicotine dependence: Secondary | ICD-10-CM | POA: Diagnosis not present

## 2024-01-07 DIAGNOSIS — I2510 Atherosclerotic heart disease of native coronary artery without angina pectoris: Secondary | ICD-10-CM | POA: Diagnosis not present

## 2024-01-07 DIAGNOSIS — R0602 Shortness of breath: Secondary | ICD-10-CM | POA: Diagnosis present

## 2024-01-07 LAB — CBC
HCT: 40.4 % (ref 36.0–46.0)
Hemoglobin: 13.8 g/dL (ref 12.0–15.0)
MCH: 30.6 pg (ref 26.0–34.0)
MCHC: 34.2 g/dL (ref 30.0–36.0)
MCV: 89.6 fL (ref 80.0–100.0)
Platelets: 359 10*3/uL (ref 150–400)
RBC: 4.51 MIL/uL (ref 3.87–5.11)
RDW: 13.2 % (ref 11.5–15.5)
WBC: 9.3 10*3/uL (ref 4.0–10.5)
nRBC: 0 % (ref 0.0–0.2)

## 2024-01-07 LAB — BASIC METABOLIC PANEL
Anion gap: 10 (ref 5–15)
BUN: 9 mg/dL (ref 6–20)
CO2: 24 mmol/L (ref 22–32)
Calcium: 9.2 mg/dL (ref 8.9–10.3)
Chloride: 104 mmol/L (ref 98–111)
Creatinine, Ser: 0.75 mg/dL (ref 0.44–1.00)
GFR, Estimated: >60 mL/min (ref >60)
Glucose, Bld: 119 mg/dL — ABNORMAL HIGH (ref 70–99)
Potassium: 3.9 mmol/L (ref 3.5–5.1)
Sodium: 138 mmol/L (ref 135–145)

## 2024-01-07 LAB — BLOOD GAS, VENOUS

## 2024-01-07 LAB — TROPONIN I (HIGH SENSITIVITY): Troponin I (High Sensitivity): 3 ng/L (ref <18)

## 2024-01-07 MED ORDER — ATORVASTATIN CALCIUM 20 MG PO TABS
20.0000 mg | ORAL_TABLET | Freq: Every day | ORAL | Status: DC
  Administered 2024-01-07 – 2024-01-09 (×3): 20 mg via ORAL
  Filled 2024-01-07 (×3): qty 1

## 2024-01-07 MED ORDER — METHYLPREDNISOLONE SODIUM SUCC 40 MG IJ SOLR
40.0000 mg | Freq: Two times a day (BID) | INTRAMUSCULAR | Status: AC
  Administered 2024-01-07 – 2024-01-08 (×2): 40 mg via INTRAVENOUS
  Filled 2024-01-07 (×2): qty 1

## 2024-01-07 MED ORDER — SODIUM CHLORIDE 0.9 % IV SOLN
500.0000 mg | INTRAVENOUS | Status: AC
  Administered 2024-01-08: 500 mg via INTRAVENOUS
  Filled 2024-01-07 (×2): qty 5

## 2024-01-07 MED ORDER — IPRATROPIUM-ALBUTEROL 0.5-2.5 (3) MG/3ML IN SOLN
3.0000 mL | Freq: Once | RESPIRATORY_TRACT | Status: AC
  Administered 2024-01-07: 3 mL via RESPIRATORY_TRACT
  Filled 2024-01-07: qty 3

## 2024-01-07 MED ORDER — PANTOPRAZOLE SODIUM 40 MG PO TBEC
40.0000 mg | DELAYED_RELEASE_TABLET | Freq: Every day | ORAL | Status: DC
  Administered 2024-01-08 – 2024-01-10 (×3): 40 mg via ORAL
  Filled 2024-01-07 (×3): qty 1

## 2024-01-07 MED ORDER — PREDNISONE 20 MG PO TABS
40.0000 mg | ORAL_TABLET | Freq: Every day | ORAL | Status: DC
  Administered 2024-01-09 – 2024-01-10 (×2): 40 mg via ORAL
  Filled 2024-01-07 (×2): qty 2

## 2024-01-07 MED ORDER — LORATADINE 10 MG PO TABS
10.0000 mg | ORAL_TABLET | Freq: Every day | ORAL | Status: DC
  Administered 2024-01-08 – 2024-01-10 (×3): 10 mg via ORAL
  Filled 2024-01-07 (×3): qty 1

## 2024-01-07 MED ORDER — HYDROCODONE-ACETAMINOPHEN 5-325 MG PO TABS
1.0000 | ORAL_TABLET | Freq: Four times a day (QID) | ORAL | Status: DC | PRN
  Administered 2024-01-08 – 2024-01-10 (×6): 1 via ORAL
  Filled 2024-01-07 (×6): qty 1

## 2024-01-07 MED ORDER — ARIPIPRAZOLE 2 MG PO TABS
2.0000 mg | ORAL_TABLET | Freq: Every day | ORAL | Status: DC
  Administered 2024-01-08 – 2024-01-10 (×3): 2 mg via ORAL
  Filled 2024-01-07 (×3): qty 1

## 2024-01-07 MED ORDER — ALBUTEROL SULFATE (2.5 MG/3ML) 0.083% IN NEBU: 2.5000 mg | INHALATION_SOLUTION | RESPIRATORY_TRACT | Status: DC | PRN

## 2024-01-07 MED ORDER — METHYLPREDNISOLONE SODIUM SUCC 125 MG IJ SOLR: 125.0000 mg | Freq: Once | INTRAMUSCULAR | Status: DC

## 2024-01-07 MED ORDER — AZITHROMYCIN 500 MG PO TABS
500.0000 mg | ORAL_TABLET | Freq: Every day | ORAL | Status: AC
  Administered 2024-01-09 – 2024-01-10 (×2): 500 mg via ORAL
  Filled 2024-01-07 (×2): qty 1

## 2024-01-07 MED ORDER — ENOXAPARIN SODIUM 40 MG/0.4ML IJ SOSY
40.0000 mg | PREFILLED_SYRINGE | INTRAMUSCULAR | Status: DC
  Administered 2024-01-07 – 2024-01-09 (×3): 40 mg via SUBCUTANEOUS
  Filled 2024-01-07 (×3): qty 0.4

## 2024-01-07 MED ORDER — ONDANSETRON HCL 4 MG PO TABS: 4.0000 mg | ORAL_TABLET | Freq: Four times a day (QID) | ORAL | Status: DC | PRN

## 2024-01-07 MED ORDER — ASPIRIN 81 MG PO TBEC
81.0000 mg | DELAYED_RELEASE_TABLET | Freq: Every day | ORAL | Status: DC
  Administered 2024-01-08 – 2024-01-10 (×3): 81 mg via ORAL
  Filled 2024-01-07 (×3): qty 1

## 2024-01-07 MED ORDER — BUPROPION HCL ER (XL) 150 MG PO TB24
150.0000 mg | ORAL_TABLET | Freq: Every morning | ORAL | Status: DC
  Administered 2024-01-08 – 2024-01-10 (×3): 150 mg via ORAL
  Filled 2024-01-07 (×3): qty 1

## 2024-01-07 MED ORDER — IPRATROPIUM-ALBUTEROL 0.5-2.5 (3) MG/3ML IN SOLN
3.0000 mL | Freq: Four times a day (QID) | RESPIRATORY_TRACT | Status: DC
  Administered 2024-01-07 – 2024-01-08 (×6): 3 mL via RESPIRATORY_TRACT
  Filled 2024-01-07 (×6): qty 3

## 2024-01-07 MED ORDER — ONDANSETRON HCL 4 MG/2ML IJ SOLN: 4.0000 mg | Freq: Four times a day (QID) | INTRAMUSCULAR | Status: DC | PRN

## 2024-01-07 NOTE — Assessment & Plan Note (Signed)
 Continue Wellbutrin and Lexapro.

## 2024-01-07 NOTE — Assessment & Plan Note (Signed)
 No active chest pain Continue aspirin and statin Monitor

## 2024-01-07 NOTE — Assessment & Plan Note (Signed)
 PPI ?

## 2024-01-07 NOTE — ED Triage Notes (Addendum)
 Pt to ED via [facility] c/o [facility] and cough X 3 days, apparently pt was at doctor office for sick visit, and was noted to have room air sats in 70s, 1 duoneb given at office, 1 duoneb given by EMS,. Pt room air sats in ED 96-100%, Pt has hx of COPD, around second hand smoke.   #20LAC- 125MG   Solumedrol given EMS   Last VS: 115/78 hr80

## 2024-01-07 NOTE — Assessment & Plan Note (Signed)
 Currently stable Noted prior recommendations for Humate-P prior to any operative intervention Monitor

## 2024-01-07 NOTE — Assessment & Plan Note (Addendum)
 COPD exacerbation Decompensated respiratory status requiring 2 to 3 L nasal cannula in setting of baseline COPD now with exacerbation Chest x-ray grossly within normal limits IV Solu-Medrol DuoNebs IV azithromycin Continue supplemental oxygen as needed COVID flu and RSV negative Add on expanded respiratory panel Continue supplemental oxygen Monitor closely

## 2024-01-07 NOTE — ED Provider Notes (Signed)
 [facility] Provider Note    Event Date/Time   First MD Initiated Contact with Patient 01/07/24 1210     (approximate)   History   Shortness of Breath and Cough   HPI  Lauren Escobar is a 58 y.o. female with history of COPD, not on home oxygen presents with complaints of shortness of breath.  Sent from clinic after found to be hypoxic in the 70s.  Has improved with DuoNeb and nasal cannula oxygen but still short of breath.  Denies fevers, does have productive cough     Physical Exam   Triage Vital Signs: ED Triage Vitals  Encounter Vitals Group     BP 01/07/24 1214 116/69     Systolic BP Percentile --      Diastolic BP Percentile --      Pulse Rate 01/07/24 1214 81     Resp 01/07/24 1214 (!) 24     Temp 01/07/24 1214 98.2 F (36.8 C)     Temp Source 01/07/24 1214 Oral     SpO2 01/07/24 1214 96 %     Weight 01/07/24 1215 75.3 kg (166 lb)     Height 01/07/24 1215 1.499 m (4\' 11" )     Head Circumference --      Peak Flow --      Pain Score 01/07/24 1215 9     Pain Loc --      Pain Education --      Exclude from Growth Chart --     Most recent vital signs: Vitals:   01/07/24 1214 01/07/24 1215  BP: 116/69 116/69  Pulse: 81 80  Resp: (!) 24 16  Temp: 98.2 F (36.8 C)   SpO2: 96% 92%     General: Awake, no distress.  CV:  Good peripheral perfusion.  Resp:  Tachypnea, scattered wheezing Abd:  No distention.  Other:  No calf pain or swelling   ED Results / Procedures / Treatments   Labs (all labs ordered are listed, but only abnormal results are displayed) Labs Reviewed  BASIC METABOLIC PANEL - Abnormal; Notable for the following components:      Result Value   Glucose, Bld 119 (*)    All other components within normal limits  CBC  BLOOD GAS, VENOUS  TROPONIN I (HIGH SENSITIVITY)     EKG  ED ECG REPORT I, Jene Every, the attending physician, personally viewed and interpreted this ECG.  Date:  01/07/2024  Rhythm: normal sinus rhythm QRS Axis: normal Intervals: normal ST/T Wave abnormalities: normal Narrative Interpretation: no evidence of acute ischemia    RADIOLOGY Chest x-ray viewed interpret by me, no acute abnormality    PROCEDURES:  Critical Care performed: yes  CRITICAL CARE Performed by: Jene Every   Total critical care time: 30 minutes  Critical care time was exclusive of separately billable procedures and treating other patients.  Critical care was necessary to treat or prevent imminent or life-threatening deterioration.  Critical care was time spent personally by me on the following activities: development of treatment plan with patient and/or surrogate as well as nursing, discussions with consultants, evaluation of patient's response to treatment, examination of patient, obtaining history from patient or surrogate, ordering and performing treatments and interventions, ordering and review of laboratory studies, ordering and review of radiographic studies, pulse oximetry and re-evaluation of patient's condition.   Procedures   MEDICATIONS ORDERED IN ED: Medications  ipratropium-albuterol (DUONEB) 0.5-2.5 (3) MG/3ML nebulizer solution 3 mL (has no administration  in time range)  ipratropium-albuterol (DUONEB) 0.5-2.5 (3) MG/3ML nebulizer solution 3 mL (3 mLs Nebulization Given 01/07/24 1254)     IMPRESSION / MDM / ASSESSMENT AND PLAN / ED COURSE  I reviewed the triage vital signs and the nursing notes. Patient's presentation is most consistent with acute presentation with potential threat to life or bodily function.  Patient presents with shortness of breath, hypoxia on room air consistent with respiratory failure likely secondary to COPD versus pneumonia.  Will treat with additional DuoNebs here, IV Solu-Medrol, will obtain chest x-ray, labs and carefully monitor.  Patient somewhat improved after nebs but still with mild tachypnea and requiring  O2, will admit to the hospitalist service      FINAL CLINICAL IMPRESSION(S) / ED DIAGNOSES   Final diagnoses:  COPD exacerbation (HCC)  Acute respiratory failure with hypoxia (HCC)     Rx / DC Orders   ED Discharge Orders     None        Note:  This document was prepared using Dragon voice recognition software and may include unintentional dictation errors.   Jene Every, MD 01/07/24 [phone]

## 2024-01-07 NOTE — Assessment & Plan Note (Signed)
 BP stable Titrate home regimen

## 2024-01-07 NOTE — H&P (Signed)
 History and Physical    Patient: Lauren Escobar XBJ:[id number] DOB: December 19, 1965 DOA: 01/07/2024 DOS: the patient was seen and examined on 01/07/2024 PCP: Oswaldo Conroy, MD  Patient coming from: Home  Chief Complaint:  Chief Complaint  Patient presents with   Shortness of Breath   Cough   HPI: Lauren Escobar is a 58 y.o. female with medical history significant of COPD, von Willebrand's disease, type 2 diabetes, hypertension, hyperlipidemia presenting with acute respiratory failure with hypoxia, COPD exacerbation.  Patient reports mild viral URI symptoms roughly 1 week ago.  Positive rhinorrhea nasal congestion and cough.  Drainage progressively worsened over the course of the week with noted worsening cough wheezing increased sputum production.  Baseline COPD.  Not on oxygen.  No reported recent tobacco use.  Worsening symptoms despite home inhaler use.  Minimal to mild orthopnea PND.  No abdominal pain or diarrhea.  No chest pain.  No focal hemiparesis or confusion. Presented to the ER afebrile, hemodynamically stable.  Satting in the mid 80s on room air.  Transition to 3 L nasal cannula to keep O2 sats greater than 92%.  White count 9.3, hemoglobin 13.8, platelets 359, VBG stable, creatinine 0.75.  Troponin within normal limits.  EKG normal sinus rhythm.  Chest x ray preliminarily within norm limits. Review of Systems: As mentioned in the history of present illness. All other systems reviewed and are negative. Past Medical History:  Diagnosis Date   Abdominal pain    Abnormal mammogram    Anxiety    Asthma    Bleeding disorder (HCC)    Blood dyscrasia    polycythemia vera   Chronic kidney disease    COPD (chronic obstructive pulmonary disease) (HCC)    Cough    Depression    Diabetes mellitus without complication (HCC)    Fatigue    Headache 2000   Horizontal nystagmus    age 65   Hyperglycemia    Hypertension    Low back pain    Major depressive disorder    Nasal  lesion    Neck pain    Snoring    SOB (shortness of breath)    Tobacco use    UTI (urinary tract infection)    Von Willebrand's disease (HCC)    Past Surgical History:  Procedure Laterality Date   ABDOMINAL HYSTERECTOMY  2002   oophorectomy   ABDOMINAL HYSTERECTOMY     APPENDECTOMY     BLADDER SURGERY     BREAST BIOPSY Left    NEG   CARDIAC CATHETERIZATION     CARDIAC CATHETERIZATION N/A 07/17/2016   Procedure: Left Heart Cath and Coronary Angiography;  Surgeon: Lamar Blinks, MD;  Location: [facility] INVASIVE CV LAB;  Service: Cardiovascular;  Laterality: N/A;   COLONOSCOPY WITH PROPOFOL N/A 07/27/2015   Procedure: COLONOSCOPY WITH PROPOFOL;  Surgeon: Scot Jun, MD;  Location: [facility] ENDOSCOPY;  Service: Endoscopy;  Laterality: N/A;   COLONOSCOPY WITH PROPOFOL N/A 05/31/2019   Procedure: COLONOSCOPY WITH PROPOFOL;  Surgeon: Toney Reil, MD;  Location: [facility] ENDOSCOPY;  Service: Gastroenterology;  Laterality: N/A;   ESOPHAGOGASTRODUODENOSCOPY N/A 06/18/2017   Procedure: ESOPHAGOGASTRODUODENOSCOPY (EGD);  Surgeon: Toney Reil, MD;  Location: [facility] ENDOSCOPY;  Service: Gastroenterology;  Laterality: N/A;   ESOPHAGOGASTRODUODENOSCOPY (EGD) WITH PROPOFOL N/A 05/31/2019   Procedure: ESOPHAGOGASTRODUODENOSCOPY (EGD) WITH PROPOFOL;  Surgeon: Toney Reil, MD;  Location: [facility] ENDOSCOPY;  Service: Gastroenterology;  Laterality: N/A;   REPLACEMENT TOTAL KNEE Left    SHOULDER SURGERY Left  x 2   Social History:  reports that she quit smoking about 3 years ago. Her smoking use included cigarettes. She started smoking about 32 years ago. She has a 79.8 pack-year smoking history. She has never used smokeless tobacco. She reports that she does not drink alcohol and does not use drugs.  Allergies  Allergen Reactions   Aspirin     Able to tolerate low dose   Pregabalin Other (See Comments)    Unsteady gate, causing falls   Trazodone Other (See Comments)    weakness    Tramadol Rash    Family History  Problem Relation Age of Onset   Cancer Father        prostate   Stroke Father    Diabetes Father    Cancer Mother        bladder   Breast cancer Maternal Grandmother     Prior to Admission medications   Medication Sig Start Date End Date Taking? Authorizing Provider  albuterol (PROVENTIL) (2.5 MG/3ML) 0.083% nebulizer solution Take 3 mLs (2.5 mg total) by nebulization every 6 (six) hours as needed for wheezing or shortness of breath. 07/04/23  Yes Dgayli, Lianne Bushy, MD  albuterol (VENTOLIN HFA) 108 (90 Base) MCG/ACT inhaler Inhale into the lungs. 11/03/15  Yes [provider]  ARIPiprazole (ABILIFY) 2 MG tablet Take 2 mg by mouth daily. 07/24/23  Yes [provider]  aspirin EC 81 MG tablet Take 81 mg by mouth daily. 05/21/17 05/21/26 Yes [provider]  atorvastatin (LIPITOR) 20 MG tablet Take 20 mg by mouth at bedtime. 04/21/19  Yes [provider]  buPROPion (WELLBUTRIN XL) 150 MG 24 hr tablet Take 150 mg by mouth every morning. 12/30/23 12/29/24 Yes [provider]  cetirizine (ZYRTEC) 10 MG tablet Take 10 mg by mouth daily. 04/27/19  Yes [provider]  cyclobenzaprine (FLEXERIL) 10 MG tablet Take 5-10 mg by mouth every evening. 12/16/23  Yes [provider]  escitalopram (LEXAPRO) 10 MG tablet Take 10 mg by mouth daily. 12/25/23  Yes [provider]  esomeprazole (NEXIUM) 40 MG capsule Take 40 mg by mouth daily.  08/07/14  Yes [provider]  folic acid (FOLVITE) 1 MG tablet Take 1 mg by mouth daily. 05/01/19  Yes [provider]  isosorbide mononitrate (IMDUR) 30 MG 24 hr tablet Take 30 mg by mouth daily. 05/01/19  Yes [provider]  mirtazapine (REMERON) 15 MG tablet Take 15 mg by mouth at bedtime. 11/05/23  Yes [provider]  Multiple Vitamins-Minerals (MULTIVIT/MULTIMINERAL ADULT PO) Take 1 tablet by mouth daily.   Yes [provider]   propranolol (INDERAL) 40 MG tablet Take 40 mg by mouth 2 (two) times daily. 05/24/19  Yes [provider]  Tiotropium Bromide Monohydrate (SPIRIVA RESPIMAT) 2.5 MCG/ACT AERS Inhale 2 puffs into the lungs daily. 07/08/23  Yes Dgayli, Lianne Bushy, MD  NITROGLYCERIN SL Place 1 tablet under the tongue. Patient not taking: Reported on 01/07/2024    [provider]    Physical Exam: Vitals:   01/07/24 1215 01/07/24 1345 01/07/24 1415 01/07/24 1430  BP: 116/69     Pulse: 80 84 88 80  Resp: 16 17 17 15   Temp:      TempSrc:      SpO2: 92% 100% 98% 97%  Weight: 75.3 kg     Height: 4\' 11"  (1.499 m)      Physical Exam Constitutional:      Appearance: She is normal weight.  HENT:  Head: Normocephalic and atraumatic.     Nose: Nose normal.     Mouth/Throat:     Mouth: Mucous membranes are moist.  Eyes:     Pupils: Pupils are equal, round, and reactive to light.  Cardiovascular:     Rate and Rhythm: Normal rate and regular rhythm.  Pulmonary:     Effort: Pulmonary effort is normal.     Breath sounds: Wheezing present.  Abdominal:     General: Bowel sounds are normal.  Musculoskeletal:        General: Normal range of motion.  Skin:    General: Skin is warm.  Neurological:     General: No focal deficit present.  Psychiatric:        Mood and Affect: Mood normal.     Data Reviewed:  There are no new results to review at this time.  MM 3D SCREENING MAMMOGRAM BILATERAL BREAST CLINICAL DATA:  Screening.  EXAM: DIGITAL SCREENING BILATERAL MAMMOGRAM WITH TOMOSYNTHESIS AND CAD  TECHNIQUE: Bilateral screening digital craniocaudal and mediolateral oblique mammograms were obtained. Bilateral screening digital breast tomosynthesis was performed. The images were evaluated with computer-aided detection.  COMPARISON:  Previous exam(s).  ACR Breast Density Category b: There are scattered areas of fibroglandular density.  FINDINGS: There are no findings suspicious for  malignancy.  IMPRESSION: No mammographic evidence of malignancy. A result letter of this screening mammogram will be mailed directly to the patient.  RECOMMENDATION: Screening mammogram in one year. (Code:[id number]-B-01Y)  BI-RADS CATEGORY  1: Negative.  Electronically Signed   By: Amie Portland M.D.   On: 01/07/2024 07:47  Lab Results  Component Value Date   WBC 9.3 01/07/2024   HGB 13.8 01/07/2024   HCT 40.4 01/07/2024   MCV 89.6 01/07/2024   PLT 359 01/07/2024   Last metabolic panel Lab Results  Component Value Date   GLUCOSE 119 (H) 01/07/2024   NA 138 01/07/2024   K 3.9 01/07/2024   CL 104 01/07/2024   CO2 24 01/07/2024   BUN 9 01/07/2024   CREATININE 0.75 01/07/2024   GFRNONAA >60 01/07/2024   CALCIUM 9.2 01/07/2024   PROT 6.9 06/23/2023   ALBUMIN 4.2 06/23/2023   BILITOT 1.1 06/23/2023   ALKPHOS 56 06/23/2023   AST 71 (H) 06/23/2023   ALT 54 (H) 06/23/2023   ANIONGAP 10 01/07/2024    Assessment and Plan: * Acute respiratory failure with hypoxia (HCC) COPD exacerbation Decompensated respiratory status requiring 2 to 3 L nasal cannula in setting of baseline COPD now with exacerbation Chest x-ray grossly within normal limits IV Solu-Medrol DuoNebs IV azithromycin Continue supplemental oxygen as needed COVID flu and RSV negative Add on expanded respiratory panel Continue supplemental oxygen Monitor closely  CAD (coronary artery disease) No active chest pain Continue aspirin and statin Monitor  HTN (hypertension) BP stable Titrate home regimen  Depression with anxiety Continue Wellbutrin and Lexapro  Von Willebrand's disease (HCC) Currently stable Noted prior recommendations for Humate-P prior to any operative intervention Monitor  GERD (gastroesophageal reflux disease) PPI      Advance Care Planning:   Code Status: Full Code   Consults: None   Family Communication: Husband at the bedside   Severity of Illness: The appropriate patient  status for this patient is OBSERVATION. Observation status is judged to be reasonable and necessary in order to provide the required intensity of service to ensure the patient's safety. The patient's presenting symptoms, physical exam findings, and initial radiographic and laboratory data in the context of their  medical condition is felt to place them at decreased risk for further clinical deterioration. Furthermore, it is anticipated that the patient will be medically stable for discharge from the hospital within 2 midnights of admission.   Author: Floydene Flock, MD 01/07/2024 2:56 PM  For on call review [email or web address].

## 2024-01-08 DIAGNOSIS — J441 Chronic obstructive pulmonary disease with (acute) exacerbation: Secondary | ICD-10-CM | POA: Diagnosis not present

## 2024-01-08 DIAGNOSIS — D6800 Von Willebrand disease, unspecified: Secondary | ICD-10-CM | POA: Diagnosis not present

## 2024-01-08 DIAGNOSIS — J9601 Acute respiratory failure with hypoxia: Secondary | ICD-10-CM | POA: Diagnosis not present

## 2024-01-08 DIAGNOSIS — K219 Gastro-esophageal reflux disease without esophagitis: Secondary | ICD-10-CM

## 2024-01-08 DIAGNOSIS — I10 Essential (primary) hypertension: Secondary | ICD-10-CM | POA: Diagnosis not present

## 2024-01-08 LAB — RESPIRATORY PANEL BY PCR

## 2024-01-08 LAB — COMPREHENSIVE METABOLIC PANEL
ALT: 37 U/L (ref 0–44)
AST: 57 U/L — ABNORMAL HIGH (ref 15–41)
Albumin: 4.2 g/dL (ref 3.5–5.0)
Alkaline Phosphatase: 47 U/L (ref 38–126)
Anion gap: 8 (ref 5–15)
BUN: 18 mg/dL (ref 6–20)
CO2: 21 mmol/L — ABNORMAL LOW (ref 22–32)
Calcium: 9.3 mg/dL (ref 8.9–10.3)
Chloride: 107 mmol/L (ref 98–111)
Creatinine, Ser: 0.66 mg/dL (ref 0.44–1.00)
GFR, Estimated: >60 mL/min (ref >60)
Glucose, Bld: 162 mg/dL — ABNORMAL HIGH (ref 70–99)
Potassium: 3.8 mmol/L (ref 3.5–5.1)
Sodium: 136 mmol/L (ref 135–145)
Total Bilirubin: 0.7 mg/dL (ref 0.0–1.2)
Total Protein: 7.2 g/dL (ref 6.5–8.1)

## 2024-01-08 LAB — CBC
HCT: 39.2 % (ref 36.0–46.0)
Hemoglobin: 13.6 g/dL (ref 12.0–15.0)
MCH: 30 pg (ref 26.0–34.0)
MCHC: 34.7 g/dL (ref 30.0–36.0)
MCV: 86.3 fL (ref 80.0–100.0)
Platelets: 362 10*3/uL (ref 150–400)
RBC: 4.54 MIL/uL (ref 3.87–5.11)
RDW: 13.2 % (ref 11.5–15.5)
WBC: 13.3 10*3/uL — ABNORMAL HIGH (ref 4.0–10.5)
nRBC: 0 % (ref 0.0–0.2)

## 2024-01-08 LAB — BLOOD GAS, VENOUS
Bicarbonate: 26.5 mmol/L (ref 20.0–28.0)
O2 Saturation: 58.3 mmol/L (ref 0.0–2.0)
Patient temperature: 37
Patient temperature: 58.3 %
pCO2, Ven: 40 mmHg — ABNORMAL LOW (ref 44–60)
pH, Ven: 7.43 (ref 7.25–7.43)
pO2, Ven: 26.5 mmol/L (ref 32–45)

## 2024-01-08 LAB — HIV ANTIBODY (ROUTINE TESTING W REFLEX): HIV Screen 4th Generation wRfx: NONREACTIVE

## 2024-01-08 MED ORDER — IPRATROPIUM-ALBUTEROL 0.5-2.5 (3) MG/3ML IN SOLN
3.0000 mL | Freq: Three times a day (TID) | RESPIRATORY_TRACT | Status: DC
Start: 2024-01-09 — End: 2024-01-10
  Administered 2024-01-09 – 2024-01-10 (×5): 3 mL via RESPIRATORY_TRACT
  Filled 2024-01-08 (×5): qty 3

## 2024-01-08 NOTE — Care Management Obs Status (Signed)
 MEDICARE OBSERVATION STATUS NOTIFICATION   Patient Details  Name: Lauren Escobar MRN: [id number] Date of Birth: 1966/07/13   Medicare Observation Status Notification Given:  Orland Dec, CMA 01/08/2024, 2:09 PM

## 2024-01-08 NOTE — Hospital Course (Addendum)
 Taken from H&P.   Lauren Escobar is a 58 y.o. female with medical history significant of COPD, not on home oxygen, von Willebrand's disease, type 2 diabetes, hypertension, hyperlipidemia presenting with acute respiratory failure with hypoxia, COPD exacerbation.  Patient reports mild viral URI symptoms roughly 1 week ago.   On presentation patient was hypoxic in mid 80s on room air requiring initially up to 3 L of oxygen, labs stable and troponin normal limit.COVID flu and RSV negative.  EKG with NSR Chest x-ray without any acute abnormalities.  Patient was given breathing treatments and steroid.  3/13: Vital stable on 2 L of oxygen, labs with mild metabolic acidosis with CO2 of 21, leukocytosis at 13.3 likely due to steroid. Patient was able to wean off from oxygen but still feeling short of breath so keeping for another night.  Extended respiratory panel was ordered and pending.

## 2024-01-08 NOTE — Evaluation (Signed)
 Physical Therapy Evaluation Patient Details Name: Lauren Escobar MRN: [id number] DOB: 12/04/1965 Today's Date: 01/08/2024  History of Present Illness  presented to ER secondary to cough, SOB; admitted for management of acute respiratory failure with hypoxia secondary to COPD exacerbation.  Clinical Impression  Patient resting in bed upon arrival to room; alert and oriented, follows commands and agreeable to participation with session.  Denies pain; does endorse some improvement in respiratory status since admission.  Bilat UE/LE strength and ROM grossly symmetrical and WFL; no focal weakness appreciated. Able to complete bed mobility with mod indep; sit/stand, basic transfers and gait (120') without assist device, cga/min assist.  Demonstrates partially reciprocal stepping pattern with broad BOS, choppy stepping pattern; mild sway/deviation with head turns and changes of direction. Does self-correct LOB with LE step strategy. Mod/significant SOB with distance, BORG 8-9/10; sats >90% on RA with gait.  Left on RA end of session; CNA/RN informed/aware. Did discuss/encourage use of RW (for energy conservation and overall safety/stability); patient declined despite encouragement for trial.   Would benefit from skilled PT to address above deficits and promote optimal return to PLOF.; recommend post-acute PT follow up as indicated by interdisciplinary care team.          If plan is discharge home, recommend the following: A little help with walking and/or transfers;A little help with bathing/dressing/bathroom   Can travel by private vehicle        Equipment Recommendations    Recommendations for Other Services       Functional Status Assessment Patient has had a recent decline in their functional status and demonstrates the ability to make significant improvements in function in a reasonable and predictable amount of time.     Precautions / Restrictions Precautions Precautions:  Fall Restrictions Weight Bearing Restrictions Per Provider Order: No      Mobility  Bed Mobility Overal bed mobility: Modified Independent                  Transfers Overall transfer level: Needs assistance   Transfers: Sit to/from Stand Sit to Stand: Contact guard assist, Min assist                Ambulation/Gait Ambulation/Gait assistance: Contact guard assist, Min assist Gait Distance (Feet): 120 Feet Assistive device: None         General Gait Details: partially reciprocal stepping pattern with broad BOS, choppy stepping pattern; mild sway/deviation with head turns and changes of direction.  Does self-correct LOB with LE step strategy.  Mod/significant SOB with distance, BORG 8-9/10; sats >90% on RA with gait  Stairs            Wheelchair Mobility     Tilt Bed    Modified Rankin (Stroke Patients Only)       Balance Overall balance assessment: Needs assistance Sitting-balance support: No upper extremity supported, Feet supported Sitting balance-Leahy Scale: Good     Standing balance support: No upper extremity supported Standing balance-Leahy Scale: Fair                               Pertinent Vitals/Pain Pain Assessment Pain Assessment: No/denies pain    Home Living Family/patient expects to be discharged to:: Private residence Living Arrangements: Spouse/significant other Available Help at Discharge: Family Type of Home: Mobile home Home Access: Stairs to enter Entrance Stairs-Rails: Right;Left;Can reach both Entrance Stairs-Number of Steps: 5   Home Layout: One level  Prior Function Prior Level of Function : Independent/Modified Independent;Driving             Mobility Comments: Denies fall history; no home O2       Extremity/Trunk Assessment   Upper Extremity Assessment Upper Extremity Assessment: Overall WFL for tasks assessed    Lower Extremity Assessment Lower Extremity Assessment:  Overall WFL for tasks assessed (grossly at least 4/5 throughout)       Communication        Cognition Arousal: Alert Behavior During Therapy: [facility] for tasks assessed/performed   PT - Cognitive impairments: No apparent impairments                                 Cueing       General Comments      Exercises     Assessment/Plan    PT Assessment Patient needs continued PT services  PT Problem List Decreased strength;Decreased range of motion;Decreased activity tolerance;Decreased balance;Decreased coordination;Decreased mobility;Decreased safety awareness;Decreased knowledge of precautions;Cardiopulmonary status limiting activity       PT Treatment Interventions DME instruction;Gait training;Stair training;Functional mobility training;Therapeutic activities;Therapeutic exercise;Balance training;Patient/family education    PT Goals (Current goals can be found in the Care Plan section)  Acute Rehab PT Goals Patient Stated Goal: to return home PT Goal Formulation: With patient Time For Goal Achievement: 01/22/24 Potential to Achieve Goals: Good    Frequency Min 2X/week     Co-evaluation               AM-PAC PT "6 Clicks" Mobility  Outcome Measure Help needed turning from your back to your side while in a flat bed without using bedrails?: None Help needed moving from lying on your back to sitting on the side of a flat bed without using bedrails?: None Help needed moving to and from a bed to a chair (including a wheelchair)?: A Little Help needed standing up from a chair using your arms (e.g., wheelchair or bedside chair)?: A Little Help needed to walk in hospital room?: A Little Help needed climbing 3-5 steps with a railing? : A Little 6 Click Score: 20    End of Session   Activity Tolerance: Patient tolerated treatment well Patient left: in bed;with call bell/phone within reach Nurse Communication: Mobility status PT Visit Diagnosis: Muscle  weakness (generalized) (M62.81);Difficulty in walking, not elsewhere classified (R26.2)    Time: 1610-9604 PT Time Calculation (min) (ACUTE ONLY): 13 min   Charges:   PT Evaluation $PT Eval Moderate Complexity: 1 Mod   PT General Charges $$ ACUTE PT VISIT: 1 Visit        Daneisha Surges H. Manson Passey, PT, DPT, NCS 01/08/24, 12:32 PM [phone]

## 2024-01-08 NOTE — Evaluation (Signed)
 Occupational Therapy Evaluation Patient Details Name: Lauren Escobar MRN: [id number] DOB: 1966-03-08 Today's Date: 01/08/2024   History of Present Illness   presented to ER secondary to cough, SOB; admitted for management of acute respiratory failure with hypoxia secondary to COPD exacerbation.     Clinical Impressions Patient presenting with decreased Ind in self care,balance, functional mobility/transfers, endurance, and safety awareness. Patient reports living at home with significant other and being Ind at baseline without use of AD. She does report baseline SOB but "not this bad".  Patient currently functioning at supervision - min guard without use of AD. OT did talk to pt about RW for energy conservation but she declines at this time. Patient will benefit from acute OT to increase overall independence in the areas of ADLs, functional mobility, and safety awareness in order to safely discharge.     If plan is discharge home, recommend the following:   A little help with walking and/or transfers     Functional Status Assessment   Patient has had a recent decline in their functional status and demonstrates the ability to make significant improvements in function in a reasonable and predictable amount of time.     Equipment Recommendations   None recommended by OT      Precautions/Restrictions   Precautions Precautions: Fall     Mobility Bed Mobility Overal bed mobility: Modified Independent                  Transfers Overall transfer level: Needs assistance   Transfers: Sit to/from Stand Sit to Stand: Contact guard assist                  Balance Overall balance assessment: Needs assistance Sitting-balance support: No upper extremity supported, Feet supported Sitting balance-Leahy Scale: Good     Standing balance support: No upper extremity supported                               ADL either performed or assessed with  clinical judgement   ADL Overall ADL's : Needs assistance/impaired     Grooming: Standing;Supervision/safety;Wash/dry hands;Wash/dry face               Lower Body Dressing: Supervision/safety;Sitting/lateral leans Lower Body Dressing Details (indicate cue type and reason): use of figure four position to don/doff B shoes and socks                     Vision Patient Visual Report: No change from baseline              Pertinent Vitals/Pain Pain Assessment Pain Assessment: No/denies pain     Extremity/Trunk Assessment Upper Extremity Assessment Upper Extremity Assessment: Generalized weakness   Lower Extremity Assessment Lower Extremity Assessment: Generalized weakness       Communication Communication Communication: No apparent difficulties   Cognition Arousal: Alert Behavior During Therapy: WFL for tasks assessed/performed Cognition: No apparent impairments                               Following commands: Intact                  Home Living Family/patient expects to be discharged to:: Private residence Living Arrangements: Spouse/significant other Available Help at Discharge: Family Type of Home: Mobile home Home Access: Stairs to enter Entrance Stairs-Number of Steps: 5 Entrance Stairs-Rails: Right;Left;Can  reach both Home Layout: One level     Bathroom Shower/Tub: Chief Strategy Officer: Standard     Home Equipment: None          Prior Functioning/Environment Prior Level of Function : Independent/Modified Independent;Driving             Mobility Comments: Denies fall history; no home O2      OT Problem List: Decreased strength;Decreased activity tolerance;Decreased safety awareness;Impaired balance (sitting and/or standing);Decreased knowledge of use of DME or AE   OT Treatment/Interventions: Self-care/ADL training;Therapeutic exercise;Therapeutic activities;Energy conservation;DME and/or AE  instruction;Patient/family education;Balance training      OT Goals(Current goals can be found in the care plan section)   Acute Rehab OT Goals Patient Stated Goal: to go home OT Goal Formulation: With patient Time For Goal Achievement: 01/22/24 Potential to Achieve Goals: Fair ADL Goals Pt Will Perform Grooming: standing;Independently Pt Will Perform Lower Body Dressing: with modified independence;sit to/from stand Pt Will Transfer to Toilet: with modified independence;ambulating Pt Will Perform Toileting - Clothing Manipulation and hygiene: with modified independence;sit to/from stand   OT Frequency:  Min 1X/week       AM-PAC OT "6 Clicks" Daily Activity     Outcome Measure Help from another person eating meals?: None Help from another person taking care of personal grooming?: None Help from another person toileting, which includes using toliet, bedpan, or urinal?: A Little Help from another person bathing (including washing, rinsing, drying)?: A Little Help from another person to put on and taking off regular upper body clothing?: None Help from another person to put on and taking off regular lower body clothing?: A Little 6 Click Score: 21   End of Session Nurse Communication: Mobility status  Activity Tolerance: Patient tolerated treatment well Patient left: in bed;with call bell/phone within reach;with bed alarm set  OT Visit Diagnosis: Unsteadiness on feet (R26.81);Repeated falls (R29.6);Muscle weakness (generalized) (M62.81)                Time: 0950-1010 OT Time Calculation (min): 20 min Charges:  OT General Charges $OT Visit: 1 Visit OT Evaluation $OT Eval Low Complexity: 1 Low  Jackquline Denmark, MS, OTR/L , CBIS ascom [phone]  01/08/24, 12:48 PM

## 2024-01-08 NOTE — ED Notes (Signed)
 This tech ambulated pt along with physical therapist at side. Pt O2 levels stayed steady at 95-100% on RA.

## 2024-01-08 NOTE — Progress Notes (Signed)
  Progress Note   Patient: Lauren Escobar:[id number] DOB: 1966/04/12 DOA: 01/07/2024     0 DOS: the patient was seen and examined on 01/08/2024   Brief hospital course: Taken from H&P.   Lauren Escobar is a 58 y.o. female with medical history significant of COPD, not on home oxygen, von Willebrand's disease, type 2 diabetes, hypertension, hyperlipidemia presenting with acute respiratory failure with hypoxia, COPD exacerbation.  Patient reports mild viral URI symptoms roughly 1 week ago.   On presentation patient was hypoxic in mid 80s on room air requiring initially up to 3 L of oxygen, labs stable and troponin normal limit.COVID flu and RSV negative.  EKG with NSR Chest x-ray without any acute abnormalities.  Patient was given breathing treatments and steroid.  3/13: Vital stable on 2 L of oxygen, labs with mild metabolic acidosis with CO2 of 21, leukocytosis at 13.3 likely due to steroid. Patient was able to wean off from oxygen but still feeling short of breath so keeping for another night.  Extended respiratory panel was ordered and pending.  Assessment and Plan: * Acute respiratory failure with hypoxia (HCC) COPD exacerbation Decompensated respiratory status requiring 2 to 3 L nasal cannula in setting of baseline COPD now with exacerbation on admission.  Able to wean back to room air Chest x-ray grossly within normal limits Continue with steroid DuoNebs Continue azithromycin Continue supplemental oxygen as needed COVID flu and RSV negative Add on expanded respiratory panel Continue supplemental oxygen Monitor closely  CAD (coronary artery disease) No active chest pain Continue aspirin and statin Monitor  HTN (hypertension) BP stable Titrate home regimen  Depression with anxiety Continue Wellbutrin and Lexapro  Von Willebrand's disease (HCC) Currently stable Noted prior recommendations for Humate-P prior to any operative intervention Monitor  GERD  (gastroesophageal reflux disease) PPI   Subjective: Patient was still feeling little short of breath and having cough.  She wants to spend another night stating that she does not feel comfortable enough to go home at this time.  Physical Exam: Vitals:   01/08/24 0933 01/08/24 1037 01/08/24 1109 01/08/24 1207  BP:  108/75 104/72 114/65  Pulse:  97 97 94  Resp:  20 19 19   Temp: 98.3 F (36.8 C)   98.2 F (36.8 C)  TempSrc: Oral     SpO2:  93% 92% 95%  Weight:      Height:       General.  Obese, anxious lady, in no acute distress. Pulmonary.  Lungs clear bilaterally, normal respiratory effort. CV.  Regular rate and rhythm, no JVD, rub or murmur. Abdomen.  Soft, nontender, nondistended, BS positive. CNS.  Alert and oriented .  No focal neurologic deficit. Extremities.  No edema, no cyanosis, pulses intact and symmetrical.  Data Reviewed: Prior data reviewed  Family Communication: Discussed with patient  Disposition: Status is: Observation The patient will require care spanning > 2 midnights and should be moved to inpatient because: Still feeling short of breath  Planned Discharge Destination: Home  DVT prophylaxis.  Lovenox Time spent: 45 minutes  This record has been created using Conservation officer, historic buildings. Errors have been sought and corrected,but may not always be located. Such creation errors do not reflect on the standard of care.   Author: Arnetha Courser, MD 01/08/2024 2:48 PM  For on call review [email or web address].

## 2024-01-08 NOTE — Unmapped (Signed)
 [facility]  Specialty Lite Counseling    Courtney Erickson is a 58 y.o. 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  I am speaking to the patient.    Was a Nurse, learning disability used for this call? No    Verified patient's date of birth / HIPAA.    Specialty Lite medication(s) to be sent: hydroxyurea    Non-specialty medications/supplies to be sent: none    Medications not needed at this time: none     An offer to provide counseling to the patient regarding their medication was made. The patient declined counseling .      Current Medications (including OTC/herbals), Comorbidities and Allergies     Current Outpatient Medications   Medication Sig Dispense Refill    ADVAIR DISKUS 250-50 mcg/dose diskus Inhale 1 puff  in the morning and 1 puff in the evening.      albuterol HFA 90 mcg/actuation inhaler       aspirin (ECOTRIN) 81 MG tablet Take 1 tablet (81 mg total) by mouth daily. 150 tablet 2    atorvastatin (LIPITOR) 20 MG tablet Take 1 tablet (20 mg total) by mouth daily. 30 tablet 11    BREO ELLIPTA 100-25 mcg/dose inhaler TAKE 1 PUFF BY MOUTH EVERY DAY      buPROPion (WELLBUTRIN XL) 150 MG 24 hr tablet Take 1 tablet (150 mg total) by mouth every morning.      esomeprazole (NEXIUM) 40 MG capsule Take 1 capsule (40 mg total) by mouth daily.      estradiol (ESTRACE) 0.01 % (0.1 mg/gram) vaginal cream INSERT 2 G INTO THE VAGINA DAILY FOR 14 DAYS, THEN 1 G TWO (2) TIMES A WEEK. 42.5 g 1    hydroxyurea (HYDREA) 500 mg capsule Take 2 capsules (1,000 mg total) by mouth daily. 60 capsule 5    ipratropium-albuterol (DUO-NEB) 0.5-2.5 mg/3 mL nebulizer Inhale 3 mL by nebulization every six (6) hours as needed. 360 mL 11    isosorbide mononitrate (ISMO,MONOKET) 10 MG tablet Take 1 tablet (10 mg total) by mouth two (2) times a day.      meloxicam (MOBIC) 7.5 MG tablet Take 1 tablet (7.5 mg total) by mouth daily.      propranoloL (INDERAL) 40 MG tablet Take 1 tablet (40 mg total) by mouth in the morning.      SPIRIVA WITH HANDIHALER 18 mcg inhalation capsule        Current Facility-Administered Medications   Medication Dose Route Frequency Provider Last Rate Last Admin    lidocaine 2% gel (XYLOCAINE) jelly urojet 20 mL  20 mL Urethral Once Ivar Bury, MD           Allergies   Allergen Reactions    Lyrica [Pregabalin] Other (See Comments)     Unsteady gate, causing falls    Trazodone      weakness    Gabapentin Nausea And Vomiting    Tramadol Rash       Patient Active Problem List   Diagnosis    Chronic interstitial cystitis    Dyspareunia    Gross hematuria    Incomplete bladder emptying    Polycythemia vera (CMS-HCC)    Von Willebrand's disease (CMS-HCC)    Atony of bladder    Overactive detrusor    Female stress incontinence    Diabetic peripheral neuropathy (CMS-HCC)    Type I diabetes mellitus (CMS-HCC)    Asthma without status asthmaticus  Chest pain    Chronic cough    Headache disorder    Congenital nystagmus    Mechanical breakdown of implanted electronic neurostimulator of peripheral nerve (CMS-HCC)    Candidiasis of female genitalia    Pain medication agreement signed    Abdominal pain, acute, right lower quadrant    Chronic hip pain    Balance problem    Dysuria    Neoplasm of uncertain behavior of bladder    Bladder outlet obstruction    Severe episode of recurrent major depressive disorder, 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    Appropriateness of Therapy     Prescription has been clinically reviewed: Yes    Financial Information     Medication Assistance provided: None Required    Anticipated copay of $0 / 30 days  reviewed with patient.     Patient Specific Needs     Does the patient have any physical, cognitive, or cultural barriers? No    Does the patient have adequate living arrangements? (i.e. the ability to store and take their medication appropriately) Yes    Did you identify any home environmental safety or security hazards? No    Patient prefers to have medications discussed with  Patient     Is the patient or caregiver able to read and understand education materials at a high school level or above? Yes    Patient's primary language is  English     Is the patient high risk? No    SOCIAL DETERMINANTS OF HEALTH     At the [facility], we have learned that life circumstances - like trouble affording food, housing, utilities, or transportation can affect the health of many of our patients.   That is why we wanted to ask: are you currently experiencing any life circumstances that are negatively impacting your health and/or quality of life? Patient declined to answer    Social Drivers of Health     Food Insecurity: No Food Insecurity (10/16/2022)    Received from [facility], [facility]    Hunger Vital Sign     Worried About Running Out of Food in the Last Year: Never true     Ran Out of Food in the Last Year: Never true   Tobacco Use: Medium Risk (07/08/2023)    Received from [facility]    Patient History     Smoking Tobacco Use: Former     Smokeless Tobacco Use: Never     Passive Exposure: Not on file   Transportation Needs: No Transportation Needs (10/16/2022)    Received from [facility], [facility]    PRAPARE - Transportation     Lack of Transportation (Medical): No     Lack of Transportation (Non-Medical): No   Alcohol Use: Not on file   Housing: Not on file   Physical Activity: Not on file   Utilities: Not At Risk (10/16/2022)    Received from [facility]    [facility] Utilities     Threatened with loss of utilities: No   Stress: Not on file   Interpersonal Safety: Not on file   Substance Use: Not on file (09/07/2023)   Intimate Partner Violence: Not At Risk (10/16/2022)    Received from [facility], [facility]    Humiliation, Afraid, Rape, and Kick questionnaire     Fear of Current or Ex-Partner: No     Emotionally Abused: No  Physically Abused: No     Sexually Abused: No   Social Connections: Not on file   Financial Resource Strain: Not on file   Depression: Not at risk (01/01/2023)    PHQ-2     PHQ-2 Score: 0   Internet Connectivity: Not on file   Health Literacy: Not on file       Would you be willing to receive help with any of the needs that you have identified today? Not applicable    Delivery Information     Verified delivery address.    Scheduled delivery date: 01/10/24    Expected start date: asap    Medication will be delivered via Next Day Courier to the prescription address in [facility].  This shipment will not require a signature.      Explained the services we provide at [facility] and that each month we will send text messages and/or mychart messages to set up refills. Informed patient that refills should be scheduled 7-10 days prior to when they will run out of medication. Informed patient that a welcome packet, containing information about our pharmacy and other support services, a Notice of Privacy Practices, and a drug information handout will be sent.      The patient or caregiver noted above participated in the development of this care plan and knows that they can request review of or adjustments to the care plan at any time.      Patient or caregiver verbalized understanding of the above information as well as how to contact the pharmacy at [phone] option 4 with any questions/concerns.  The pharmacy is open Monday through Friday 8:30am-4:30pm.  A pharmacist is available 24/7 via pager to answer any clinical questions they may have.      Kermit Balo, [facility] Specialty Pharmacist

## 2024-01-09 DIAGNOSIS — J9601 Acute respiratory failure with hypoxia: Secondary | ICD-10-CM | POA: Diagnosis not present

## 2024-01-09 LAB — BASIC METABOLIC PANEL
Anion gap: 11 (ref 5–15)
BUN: 24 mg/dL — ABNORMAL HIGH (ref 6–20)
CO2: 23 mmol/L (ref 22–32)
Calcium: 8.9 mg/dL (ref 8.9–10.3)
Chloride: 107 mmol/L (ref 98–111)
Creatinine, Ser: 0.84 mg/dL (ref 0.44–1.00)
GFR, Estimated: >60 mL/min (ref >60)
Glucose, Bld: 97 mg/dL (ref 70–99)
Potassium: 3.3 mmol/L — ABNORMAL LOW (ref 3.5–5.1)
Sodium: 141 mmol/L (ref 135–145)

## 2024-01-09 MED ORDER — ALPRAZOLAM 0.25 MG PO TABS
0.2500 mg | ORAL_TABLET | Freq: Three times a day (TID) | ORAL | Status: DC | PRN
Start: 2024-01-09 — End: 2024-01-10
  Administered 2024-01-09 (×2): 0.25 mg via ORAL
  Filled 2024-01-09 (×2): qty 1

## 2024-01-09 MED ORDER — ISOSORBIDE MONONITRATE ER 30 MG PO TB24
30.0000 mg | ORAL_TABLET | Freq: Every day | ORAL | Status: DC
  Administered 2024-01-09 – 2024-01-10 (×2): 30 mg via ORAL
  Filled 2024-01-09 (×2): qty 1

## 2024-01-09 MED ORDER — CYCLOBENZAPRINE HCL 10 MG PO TABS
5.0000 mg | ORAL_TABLET | Freq: Every evening | ORAL | Status: DC
  Administered 2024-01-09: 10 mg via ORAL
  Filled 2024-01-09: qty 1

## 2024-01-09 MED ORDER — MIRTAZAPINE 15 MG PO TABS
15.0000 mg | ORAL_TABLET | Freq: Every day | ORAL | Status: DC
Start: 2024-01-09 — End: 2024-01-10
  Administered 2024-01-09: 15 mg via ORAL
  Filled 2024-01-09: qty 1

## 2024-01-09 MED ORDER — ADULT MULTIVITAMIN LIQUID CH
Freq: Every day | ORAL | Status: DC
  Administered 2024-01-09 – 2024-01-10 (×2): 15 mL via ORAL
  Filled 2024-01-09 (×2): qty 15

## 2024-01-09 MED ORDER — POTASSIUM CHLORIDE CRYS ER 20 MEQ PO TBCR
40.0000 meq | EXTENDED_RELEASE_TABLET | Freq: Once | ORAL | Status: AC
  Administered 2024-01-09: 40 meq via ORAL
  Filled 2024-01-09: qty 2

## 2024-01-09 MED ORDER — ESCITALOPRAM OXALATE 10 MG PO TABS
10.0000 mg | ORAL_TABLET | Freq: Every day | ORAL | Status: DC
  Administered 2024-01-09 – 2024-01-10 (×2): 10 mg via ORAL
  Filled 2024-01-09 (×2): qty 1

## 2024-01-09 MED ORDER — PROPRANOLOL HCL 40 MG PO TABS
40.0000 mg | ORAL_TABLET | Freq: Two times a day (BID) | ORAL | Status: DC
  Administered 2024-01-09 – 2024-01-10 (×3): 40 mg via ORAL
  Filled 2024-01-09 (×4): qty 1

## 2024-01-09 MED ORDER — FOLIC ACID 1 MG PO TABS
1.0000 mg | ORAL_TABLET | Freq: Every day | ORAL | Status: DC
  Administered 2024-01-09 – 2024-01-10 (×2): 1 mg via ORAL
  Filled 2024-01-09 (×2): qty 1

## 2024-01-09 MED ORDER — GUAIFENESIN ER 600 MG PO TB12
1200.0000 mg | ORAL_TABLET | Freq: Two times a day (BID) | ORAL | Status: DC
  Administered 2024-01-09 – 2024-01-10 (×3): 1200 mg via ORAL
  Filled 2024-01-09 (×3): qty 2

## 2024-01-09 NOTE — Progress Notes (Signed)
 Occupational Therapy Treatment Patient Details Name: Lauren Escobar MRN: [id number] DOB: [birth year] Today's Date: 01/09/2024   History of present illness presented to ER secondary to cough, SOB; admitted for management of acute respiratory failure with hypoxia secondary to COPD exacerbation.   OT comments  Pt received lying flat in bed, on RA while appearing significantly SOB and increased work of breathing noted. Applied 2L O2, sats checked at 97%. RN in room to assist with O2 application. Requires several minutes of breathing techniques before pt was able to converse normally. Pt performing bed mobility mod independent, performs transfers reaching for HHA due to unsteadiness. Pt walked t/f sink without AD, requiring minA to correct 2x instances of LOB while ambulating. Pt returns to sit EOB, modA to correct posterior LOB as she descends (increase in balance deficits potentially due to anxiety as pt very anxious, OT provides therapeutic listening and calming strategies throughout). OT provides cues to perform PLB with increased SOB/WOB upon exertion,  Recommending +1 hands-on assist for mobility due to balance deficits (bed alarm activated, pt in agreement and RN notified. Pt left on 2L O2, needs in reach. OT will continue to follow for functional gains and progress as able.       If plan is discharge home, recommend the following:  A little help with walking and/or transfers   Equipment Recommendations  None recommended by OT       Precautions / Restrictions Precautions Precautions: Fall Restrictions Weight Bearing Restrictions Per Provider Order: No       Mobility Bed Mobility Overal bed mobility: Modified Independent                  Transfers Overall transfer level: Needs assistance Equipment used: 1 person hand held assist Transfers: Sit to/from Stand Sit to Stand: Contact guard assist, Min assist           General transfer comment: requiring minA to correct  descent     Balance Overall balance assessment: Needs assistance Sitting-balance support: No upper extremity supported, Feet supported Sitting balance-Leahy Scale: Good     Standing balance support: No upper extremity supported Standing balance-Leahy Scale: Fair                             ADL either performed or assessed with clinical judgement   ADL Overall ADL's : Needs assistance/impaired                                     Functional mobility during ADLs: Minimal assistance General ADL Comments: Pt states she has been independent in room, observed to walk to sink with two significant LOB requiring minA to correct     Communication Communication Communication: No apparent difficulties   Cognition Arousal: Alert Behavior During Therapy: Anxious                                 Following commands: Intact        Cueing   Cueing Techniques: Verbal cues, Tactile cues             Pertinent Vitals/ Pain       Pain Assessment Pain Assessment: No/denies pain   Frequency  Min 2X/week        Progress Toward Goals  OT Goals(current goals can now be  found in the care plan section)  Progress towards OT goals: Progressing toward goals  Acute Rehab OT Goals OT Goal Formulation: With patient Time For Goal Achievement: 01/22/24 Potential to Achieve Goals: Fair ADL Goals Pt Will Perform Grooming: standing;Independently Pt Will Perform Lower Body Dressing: with modified independence;sit to/from stand Pt Will Transfer to Toilet: with modified independence;ambulating Pt Will Perform Toileting - Clothing Manipulation and hygiene: with modified independence;sit to/from stand  Plan      AM-PAC OT "6 Clicks" Daily Activity     Outcome Measure   Help from another person eating meals?: None Help from another person taking care of personal grooming?: None Help from another person toileting, which includes using toliet, bedpan, or  urinal?: A Little Help from another person bathing (including washing, rinsing, drying)?: A Little Help from another person to put on and taking off regular upper body clothing?: None Help from another person to put on and taking off regular lower body clothing?: A Little 6 Click Score: 21    End of Session Equipment Utilized During Treatment: Oxygen  OT Visit Diagnosis: Unsteadiness on feet (R26.81);Repeated falls (R29.6);Muscle weakness (generalized) (M62.81)   Activity Tolerance Patient tolerated treatment well   Patient Left in bed;with call bell/phone within reach;with bed alarm set   Nurse Communication Mobility status        Time: 1610-9604 OT Time Calculation (min): 30 min  Charges: OT General Charges $OT Visit: 1 Visit  Roniqua Kintz L. Azriel Dancy, OTR/L  01/09/24, 2:45 PM

## 2024-01-09 NOTE — Progress Notes (Signed)
 PT Cancellation Note  Patient Details Name: Lauren Escobar MRN: [id number] DOB: [birth year]   Cancelled Treatment:    Reason Eval/Treat Not Completed: Fatigue/lethargy limiting ability to participate Patient reports she is not feeling well, appears SOB, however O2 sats were 98% when checked. She declines PT at this time. Will re-attempt at later date.  Cung Masterson 01/09/2024, 10:39 AM

## 2024-01-09 NOTE — Assessment & Plan Note (Signed)
 Currently stable Noted prior recommendations for Humate-P prior to any operative intervention Monitor

## 2024-01-09 NOTE — Assessment & Plan Note (Signed)
 Due to COPD Exacerbation.  Resolved. However, today the patient was tachypneic and anxious. She stated that she couldn't breathe due to coughing. Nursing placed 2 L O2 on her. The patient states that this helped with coughing. The patient is very anxious. She will be given a low dose of xanax. Will wean off of O2.

## 2024-01-09 NOTE — Progress Notes (Signed)
 Progress Note   Patient: Lauren Escobar ZOX:[id number] DOB: [birth year] DOA: 01/07/2024     0 DOS: the patient was seen and examined on 01/09/2024   Brief hospital course: Taken from H&P.   NABILA ALBARRACIN is a 58 y.o. female with medical history significant of COPD, not on home oxygen, von Willebrand's disease, type 2 diabetes, hypertension, hyperlipidemia presenting with acute respiratory failure with hypoxia, COPD exacerbation.  Patient reports mild viral URI symptoms roughly 1 week ago.   On presentation patient was hypoxic in mid 80s on room air requiring initially up to 3 L of oxygen, labs stable and troponin normal limit.COVID flu and RSV negative.  EKG with NSR Chest x-ray without any acute abnormalities.  Patient was given breathing treatments and steroid.  3/13: Vital stable on 2 L of oxygen, labs with mild metabolic acidosis with CO2 of 21, leukocytosis at 13.3 likely due to steroid. Patient was able to wean off from oxygen but still feeling short of breath so keeping for another night.  Extended respiratory panel was ordered and pending.  Assessment and Plan: * Acute respiratory failure with hypoxia (HCC) Due to COPD Exacerbation.  Resolved. However, today the patient was tachypneic and anxious. She stated that she couldn't breathe due to coughing. Nursing placed 2 L O2 on her. The patient states that this helped with coughing. The patient is very anxious. She will be given a low dose of xanax. Will wean off of O2.  CAD (coronary artery disease) No active chest pain Continue aspirin and statin Monitor  HTN (hypertension) BP stable Titrate home regimen  Depression with anxiety Continue Wellbutrin and Lexapro. Xanax 0.25 PO bid added today due to severe anxiety.  Von Willebrand's disease [facility]) Currently stable Noted prior recommendations for Humate-P prior to any operative intervention Monitor  GERD (gastroesophageal reflux disease) PPI         Subjective: The patient is tachypneic and anxious. She is saturating 100% on 2L O2 by nasal cannula. Before O2 was given, the patient was saturating 96% on room air.   Physical Exam: Vitals:   01/09/24 0709 01/09/24 0807 01/09/24 1359 01/09/24 1630  BP: 121/69   95/60  Pulse: 79   74  Resp: 18   17  Temp: 98.1 F (36.7 C)   98.1 F (36.7 C)  TempSrc: Oral     SpO2: 99% 96% 96% 97%  Weight:      Height:       Exam:  Constitutional:  The patient is awake, alert, and oriented x 3. Mild distress from anxiety. Head:  The patient has notable tardive dyskinesia. Respiratory:  The patient is somewhat tachypneic. She does have a coarse cough with deep respiration. No wheezes, rales, or rhonchi No tactile fremitus Cardiovascular:  Regular rate and rhythm No murmurs, ectopy, or gallups. No lateral PMI. No thrills. Abdomen:  Abdomen is soft, non-tender, non-distended No hernias, masses, or organomegaly Normoactive bowel sounds.  Musculoskeletal:  No cyanosis, clubbing, or edema Skin:  No rashes, lesions, ulcers palpation of skin: no induration or nodules Neurologic:  CN 2-12 intact Sensation all 4 extremities intact Psychiatric:  Mental status The patient is extremely anxious. Orientation to person, place, time  judgment and insight appear intact  Data Reviewed:  BMP  Family Communication: None available  Disposition: Status is: Observation The patient remains OBS appropriate and will d/c before 2 midnights.  Planned Discharge Destination: Home    Time spent: 34 minutes  Author: Candiace West, DO 01/09/2024 4:31  PM  For on call review [email or web address].

## 2024-01-09 NOTE — Assessment & Plan Note (Signed)
 BP stable Titrate home regimen

## 2024-01-09 NOTE — Assessment & Plan Note (Signed)
 No active chest pain Continue aspirin and statin Monitor

## 2024-01-09 NOTE — Assessment & Plan Note (Signed)
 Continue Wellbutrin and Lexapro. Xanax 0.25 PO bid added today due to severe anxiety.

## 2024-01-09 NOTE — Plan of Care (Signed)
   Problem: Education: Goal: Knowledge of General Education information will improve Description: Including pain rating scale, medication(s)/side effects and non-pharmacologic comfort measures Outcome: Progressing   Problem: Clinical Measurements: Goal: Ability to maintain clinical measurements within normal limits will improve Outcome: Progressing Goal: Will remain free from infection Outcome: Progressing Goal: Diagnostic test results will improve Outcome: Progressing Goal: Respiratory complications will improve Outcome: Progressing Goal: Cardiovascular complication will be avoided Outcome: Progressing   Problem: Activity: Goal: Risk for activity intolerance will decrease Outcome: Progressing   Problem: Nutrition: Goal: Adequate nutrition will be maintained Outcome: Progressing   Problem: Elimination: Goal: Will not experience complications related to bowel motility Outcome: Progressing Goal: Will not experience complications related to urinary retention Outcome: Progressing

## 2024-01-09 NOTE — Assessment & Plan Note (Signed)
 PPI ?

## 2024-01-10 DIAGNOSIS — J9601 Acute respiratory failure with hypoxia: Secondary | ICD-10-CM | POA: Diagnosis not present

## 2024-01-10 LAB — BASIC METABOLIC PANEL
Anion gap: 2 — ABNORMAL LOW (ref 5–15)
BUN: 18 mg/dL (ref 6–20)
CO2: 27 mmol/L (ref 22–32)
Calcium: 8.5 mg/dL — ABNORMAL LOW (ref 8.9–10.3)
Chloride: 109 mmol/L (ref 98–111)
Creatinine, Ser: 0.74 mg/dL (ref 0.44–1.00)
GFR, Estimated: >60 mL/min (ref >60)
Glucose, Bld: 88 mg/dL (ref 70–99)
Potassium: 3.5 mmol/L (ref 3.5–5.1)
Sodium: 138 mmol/L (ref 135–145)

## 2024-01-10 LAB — CBC
HCT: 35.6 % — ABNORMAL LOW (ref 36.0–46.0)
Hemoglobin: 11.9 g/dL — ABNORMAL LOW (ref 12.0–15.0)
MCH: 29.6 pg (ref 26.0–34.0)
MCHC: 33.4 g/dL (ref 30.0–36.0)
MCV: 88.6 fL (ref 80.0–100.0)
Platelets: 310 10*3/uL (ref 150–400)
RBC: 4.02 MIL/uL (ref 3.87–5.11)
RDW: 13.4 % (ref 11.5–15.5)
WBC: 7.4 10*3/uL (ref 4.0–10.5)
nRBC: 0 % (ref 0.0–0.2)

## 2024-01-10 MED ORDER — PREDNISONE 20 MG PO TABS: 40.0000 mg | ORAL_TABLET | Freq: Every day | ORAL | 0 refills | Status: AC

## 2024-01-10 MED ORDER — GUAIFENESIN ER 600 MG PO TB12: 1200.0000 mg | ORAL_TABLET | Freq: Two times a day (BID) | ORAL | 0 refills | Status: AC

## 2024-01-10 NOTE — Plan of Care (Signed)

## 2024-01-10 NOTE — Discharge Summary (Signed)
 Physician Discharge Summary   Patient: Lauren Escobar MRN: [id number] DOB: 10/29/1965  Admit date:     01/07/2024  Discharge date: 01/10/2024  Discharge Physician: Fran Lowes   PCP: Oswaldo Conroy, MD   Recommendations at discharge:    Discharge to home Follow up with PCP in 7-10 days.  Discharge Diagnoses: Principal Problem:   Acute respiratory failure with hypoxia (HCC) Active Problems:   CAD (coronary artery disease)   HTN (hypertension)   Depression with anxiety   Von Willebrand's disease (HCC)   GERD (gastroesophageal reflux disease)  Resolved Problems:   * No resolved hospital problems. [facility] Course: Taken from H&P.   Lauren Escobar is a 58 y.o. female with medical history significant of COPD, not on home oxygen, von Willebrand's disease, type 2 diabetes, hypertension, hyperlipidemia presenting with acute respiratory failure with hypoxia, COPD exacerbation.  Patient reports mild viral URI symptoms roughly 1 week ago.   On presentation patient was hypoxic in mid 80s on room air requiring initially up to 3 L of oxygen, labs stable and troponin normal limit.COVID flu and RSV negative.  EKG with NSR Chest x-ray without any acute abnormalities.  Patient was given breathing treatments and steroid.  3/13: Vital stable on 2 L of oxygen, labs with mild metabolic acidosis with CO2 of 21, leukocytosis at 13.3 likely due to steroid. Patient was able to wean off from oxygen but still feeling short of breath so keeping for another night.  Extended respiratory panel was ordered and pending.  On 01/09/2024 the patient was having difficulty with anxiety. She required 2 doses of xanax, and was again able to wean off of O2.  On 01/10/2024 the patient was saturating in the mid-nineties on room air with ambulation.  Assessment and Plan: * Acute respiratory failure with hypoxia (HCC) Due to COPD Exacerbation.  Resolved. However, today the patient was tachypneic and  anxious. She stated that she couldn't breathe due to coughing. Nursing placed 2 L O2 on her. The patient states that this helped with coughing. The patient is very anxious. She will be given a low dose of xanax. Will wean off of O2.  CAD (coronary artery disease) No active chest pain Continue aspirin and statin Monitor  HTN (hypertension) BP stable Titrate home regimen  Depression with anxiety Continue Wellbutrin and Lexapro. Xanax 0.25 PO bid added today due to severe anxiety.  Von Willebrand's disease [facility]) Currently stable Noted prior recommendations for Humate-P prior to any operative intervention Monitor  GERD (gastroesophageal reflux disease) PPI         Consultants: None Procedures performed: None  Disposition: Home Diet recommendation:  Discharge Diet Orders (From admission, onward)     Start     Ordered   01/10/24 0000  Diet - low sodium heart healthy        01/10/24 1416           Cardiac diet DISCHARGE MEDICATION: Allergies as of 01/10/2024       Reactions   Aspirin    Able to tolerate low dose   Pregabalin Other (See Comments)   Unsteady gate, causing falls   Trazodone Other (See Comments)   weakness   Tramadol Rash        Medication List     TAKE these medications    albuterol 108 (90 Base) MCG/ACT inhaler Commonly known as: VENTOLIN HFA Inhale into the lungs.   albuterol (2.5 MG/3ML) 0.083% nebulizer solution Commonly known as: PROVENTIL Take 3  mLs (2.5 mg total) by nebulization every 6 (six) hours as needed for wheezing or shortness of breath.   ARIPiprazole 2 MG tablet Commonly known as: ABILIFY Take 2 mg by mouth daily.   aspirin EC 81 MG tablet Take 81 mg by mouth daily.   atorvastatin 20 MG tablet Commonly known as: LIPITOR Take 20 mg by mouth at bedtime.   buPROPion 150 MG 24 hr tablet Commonly known as: WELLBUTRIN XL Take 150 mg by mouth every morning.   cetirizine 10 MG tablet Commonly known as: ZYRTEC Take  10 mg by mouth daily.   cyclobenzaprine 10 MG tablet Commonly known as: FLEXERIL Take 5-10 mg by mouth every evening.   escitalopram 10 MG tablet Commonly known as: LEXAPRO Take 10 mg by mouth daily.   folic acid 1 MG tablet Commonly known as: FOLVITE Take 1 mg by mouth daily.   guaiFENesin 600 MG 12 hr tablet Commonly known as: MUCINEX Take 2 tablets (1,200 mg total) by mouth 2 (two) times daily for 5 days.   isosorbide mononitrate 30 MG 24 hr tablet Commonly known as: IMDUR Take 30 mg by mouth daily.   mirtazapine 15 MG tablet Commonly known as: REMERON Take 15 mg by mouth at bedtime.   MULTIVIT/MULTIMINERAL ADULT PO Take 1 tablet by mouth daily.   NexIUM 40 MG capsule Generic drug: esomeprazole Take 40 mg by mouth daily.   NITROGLYCERIN SL Place 1 tablet under the tongue.   predniSONE 20 MG tablet Commonly known as: DELTASONE Take 2 tablets (40 mg total) by mouth daily with breakfast for 3 days. Start taking on: January 11, 2024   propranolol 40 MG tablet Commonly known as: INDERAL Take 40 mg by mouth 2 (two) times daily.   Spiriva Respimat 2.5 MCG/ACT Aers Generic drug: Tiotropium Bromide Monohydrate Inhale 2 puffs into the lungs daily.        Follow-up Information     Bender, Earl Lagos, MD Follow up.   Specialty: Family Medicine Why: Hospital follow up Contact information: [address] [facility] [address] [place] [address] [phone]                Discharge Exam: Ceasar Mons Weights   01/07/24 1215  Weight: 75.3 kg   Exam:  Constitutional:  The patient is awake, alert, and oriented x 3. No acute distress. Respiratory:  No increased work of breathing. No wheezes, rales, or rhonchi No tactile fremitus Cardiovascular:  Regular rate and rhythm No murmurs, ectopy, or gallups. No lateral PMI. No thrills. Abdomen:  Abdomen is soft, non-tender, non-distended No hernias, masses, or organomegaly Normoactive bowel sounds.   Musculoskeletal:  No cyanosis, clubbing, or edema Skin:  No rashes, lesions, ulcers palpation of skin: no induration or nodules Neurologic:  CN 2-12 intact Sensation all 4 extremities intact Psychiatric:  Mental status Mood, affect appropriate Orientation to person, place, time  judgment and insight appear intact   Condition at discharge: fair  The results of significant diagnostics from this hospitalization (including imaging, microbiology, ancillary and laboratory) are listed below for reference.   Imaging Studies: DG Chest 2 View Result Date: 01/07/2024 CLINICAL DATA:  Shortness of breath. EXAM: CHEST - 2 VIEW COMPARISON:  10/15/2022. FINDINGS: Low lung volume. Bilateral lung fields are clear. Bilateral costophrenic angles are clear. Normal cardio-mediastinal silhouette. No acute osseous abnormalities. The soft tissues are within normal limits. IMPRESSION: No active cardiopulmonary disease. Electronically Signed   By: Jules Schick M.D.   On: 01/07/2024 16:15   MM 3D  SCREENING MAMMOGRAM BILATERAL BREAST Result Date: 01/07/2024 CLINICAL DATA:  Screening. EXAM: DIGITAL SCREENING BILATERAL MAMMOGRAM WITH TOMOSYNTHESIS AND CAD TECHNIQUE: Bilateral screening digital craniocaudal and mediolateral oblique mammograms were obtained. Bilateral screening digital breast tomosynthesis was performed. The images were evaluated with computer-aided detection. COMPARISON:  Previous exam(s). ACR Breast Density Category b: There are scattered areas of fibroglandular density. FINDINGS: There are no findings suspicious for malignancy. IMPRESSION: No mammographic evidence of malignancy. A result letter of this screening mammogram will be mailed directly to the patient. RECOMMENDATION: Screening mammogram in one year. (Code:SM-B-01Y) BI-RADS CATEGORY  1: Negative. Electronically Signed   By: Amie Portland M.D.   On: 01/07/2024 07:47    Microbiology: Results for orders placed or performed during the  hospital encounter of 01/07/24  Respiratory (~20 pathogens) panel by PCR     Status: Abnormal   Collection Time: 01/08/24 10:38 AM   Specimen: Nasopharyngeal Swab; Respiratory  Result Value Ref Range Status   Adenovirus NOT DETECTED NOT DETECTED Final   Coronavirus 229E DETECTED (A) NOT DETECTED Final    Comment: (NOTE) The Coronavirus on the Respiratory Panel, DOES NOT test for the novel  Coronavirus (2019 nCoV)    Coronavirus HKU1 NOT DETECTED NOT DETECTED Final   Coronavirus NL63 NOT DETECTED NOT DETECTED Final   Coronavirus OC43 NOT DETECTED NOT DETECTED Final   Metapneumovirus NOT DETECTED NOT DETECTED Final   Rhinovirus / Enterovirus NOT DETECTED NOT DETECTED Final   Influenza A NOT DETECTED NOT DETECTED Final   Influenza B NOT DETECTED NOT DETECTED Final   Parainfluenza Virus 1 NOT DETECTED NOT DETECTED Final   Parainfluenza Virus 2 NOT DETECTED NOT DETECTED Final   Parainfluenza Virus 3 NOT DETECTED NOT DETECTED Final   Parainfluenza Virus 4 NOT DETECTED NOT DETECTED Final   Respiratory Syncytial Virus NOT DETECTED NOT DETECTED Final   Bordetella pertussis NOT DETECTED NOT DETECTED Final   Bordetella Parapertussis NOT DETECTED NOT DETECTED Final   Chlamydophila pneumoniae NOT DETECTED NOT DETECTED Final   Mycoplasma pneumoniae NOT DETECTED NOT DETECTED Final    Comment: Performed at [facility], [address] [place], [place] [address]    Labs: CBC: Recent Labs  Lab 01/07/24 1221 01/08/24 0638 01/10/24 0539  WBC 9.3 13.3* 7.4  HGB 13.8 13.6 11.9*  HCT 40.4 39.2 35.6*  MCV 89.6 86.3 88.6  PLT 359 362 310   Basic Metabolic Panel: Recent Labs  Lab 01/07/24 1221 01/08/24 0638 01/09/24 0852 01/10/24 0539  NA 138 136 141 138  K 3.9 3.8 3.3* 3.5  CL 104 107 107 109  CO2 24 21* 23 27  GLUCOSE 119* 162* 97 88  BUN 9 18 24* 18  CREATININE 0.75 0.66 0.84 0.74  CALCIUM 9.2 9.3 8.9 8.5*   Liver Function Tests: Recent Labs  Lab 01/08/24 0638  AST 57*   ALT 37  ALKPHOS 47  BILITOT 0.7  PROT 7.2  ALBUMIN 4.2   CBG: No results for input(s): "GLUCAP" in the last 168 hours.  Discharge time spent: greater than 30 minutes.  Signed: Elyon Zoll, DO [facility] 01/10/2024

## 2024-01-10 NOTE — Plan of Care (Signed)
  Problem: Education: Goal: Knowledge of General Education information will improve Description: Including pain rating scale, medication(s)/side effects and non-pharmacologic comfort measures Outcome: Progressing   Problem: Health Behavior/Discharge Planning: Goal: Ability to manage health-related needs will improve Outcome: Progressing   Problem: Clinical Measurements: Goal: Ability to maintain clinical measurements within normal limits will improve Outcome: Progressing Goal: Will remain free from infection Outcome: Progressing Goal: Diagnostic test results will improve Outcome: Progressing Goal: Respiratory complications will improve Outcome: Progressing Goal: Cardiovascular complication will be avoided Outcome: Progressing   Problem: Activity: Goal: Risk for activity intolerance will decrease Outcome: Progressing   Problem: Nutrition: Goal: Adequate nutrition will be maintained Outcome: Progressing   Problem: Coping: Goal: Level of anxiety will decrease Outcome: Progressing   Problem: Elimination: Goal: Will not experience complications related to bowel motility Outcome: Progressing Goal: Will not experience complications related to urinary retention Outcome: Progressing   Problem: Pain Managment: Goal: General experience of comfort will improve and/or be controlled Outcome: Progressing   Problem: Safety: Goal: Ability to remain free from injury will improve Outcome: Progressing   Problem: Education: Goal: Knowledge of disease or condition will improve Outcome: Progressing Goal: Knowledge of the prescribed therapeutic regimen will improve Outcome: Progressing Goal: Individualized Educational Video(s) Outcome: Progressing   Problem: Activity: Goal: Ability to tolerate increased activity will improve Outcome: Progressing Goal: Will verbalize the importance of balancing activity with adequate rest periods Outcome: Progressing

## 2024-01-16 NOTE — Unmapped (Signed)
 Patient Courtney Erickson was called in attempt number 1 to reach them regarding scheduling their upcoming appointments on 02/15/24.     The call resulted in: Message left    Please schedule the patient upon their return call on 02/15/24 for the following:     Lab Appointment or verify she has plans for walk-in labs     Thank you,    Janyth Pupa

## 2024-01-27 ENCOUNTER — Other Ambulatory Visit: Admit: 2024-01-27 | Discharge: 2024-01-28

## 2024-01-27 DIAGNOSIS — D45 Polycythemia vera: Principal | ICD-10-CM

## 2024-01-27 DIAGNOSIS — E611 Iron deficiency: Principal | ICD-10-CM

## 2024-01-27 DIAGNOSIS — E785 Hyperlipidemia, unspecified: Principal | ICD-10-CM

## 2024-01-27 LAB — COMPREHENSIVE METABOLIC PANEL
ALBUMIN: 4 g/dL (ref 3.4–5.0)
ALKALINE PHOSPHATASE: 77 U/L (ref 46–116)
ALT (SGPT): 37 U/L (ref 10–49)
ANION GAP: 8 mmol/L (ref 5–14)
AST (SGOT): 57 U/L — ABNORMAL HIGH (ref <=34)
BILIRUBIN TOTAL: 0.8 mg/dL (ref 0.3–1.2)
BLOOD UREA NITROGEN: 11 mg/dL (ref 9–23)
BUN / CREAT RATIO: 19
CALCIUM: 9.4 mg/dL (ref 8.7–10.4)
CHLORIDE: 105 mmol/L (ref 98–107)
CO2: 28 mmol/L (ref 20.0–31.0)
CREATININE: 0.59 mg/dL (ref 0.55–1.02)
EGFR CKD-EPI (2021) FEMALE: >90 mL/min/1.73m2 (ref >=60)
GLUCOSE RANDOM: 114 mg/dL (ref 70–179)
POTASSIUM: 4.5 mmol/L (ref 3.4–4.8)
PROTEIN TOTAL: 6.8 g/dL (ref 5.7–8.2)
SODIUM: 141 mmol/L (ref 135–145)

## 2024-01-27 LAB — CBC W/ AUTO DIFF
BASOPHILS ABSOLUTE COUNT: 0.1 10*9/L (ref 0.0–0.1)
BASOPHILS RELATIVE PERCENT: 0.8 %
EOSINOPHILS ABSOLUTE COUNT: 0.3 10*9/L (ref 0.0–0.5)
EOSINOPHILS RELATIVE PERCENT: 3.9 %
HEMATOCRIT: 38.1 % (ref 34.0–44.0)
HEMOGLOBIN: 13.2 g/dL (ref 11.3–14.9)
LYMPHOCYTES ABSOLUTE COUNT: 0.9 10*9/L — ABNORMAL LOW (ref 1.1–3.6)
LYMPHOCYTES RELATIVE PERCENT: 13.1 %
MEAN CORPUSCULAR HEMOGLOBIN CONC: 34.5 g/dL (ref 32.0–36.0)
MEAN CORPUSCULAR HEMOGLOBIN: 30.4 pg (ref 25.9–32.4)
MEAN CORPUSCULAR VOLUME: 87.9 fL (ref 77.6–95.7)
MEAN PLATELET VOLUME: 8.8 fL (ref 6.8–10.7)
MONOCYTES ABSOLUTE COUNT: 0.6 10*9/L (ref 0.3–0.8)
MONOCYTES RELATIVE PERCENT: 9.7 %
NEUTROPHILS ABSOLUTE COUNT: 4.8 10*9/L (ref 1.8–7.8)
NEUTROPHILS RELATIVE PERCENT: 72.5 %
PLATELET COUNT: 271 10*9/L (ref 150–450)
RED BLOOD CELL COUNT: 4.33 10*12/L (ref 3.95–5.13)
RED CELL DISTRIBUTION WIDTH: 14.3 % (ref 12.2–15.2)
WBC ADJUSTED: 6.7 10*9/L (ref 3.6–11.2)

## 2024-01-27 LAB — FERRITIN: FERRITIN: 83.6 ng/mL (ref 7.3–270.7)

## 2024-01-27 LAB — LACTATE DEHYDROGENASE: LACTATE DEHYDROGENASE: 170 U/L (ref 120–246)

## 2024-02-02 MED FILL — HYDROXYUREA 500 MG CAPSULE: ORAL | 30 days supply | Qty: 60 | Fill #1

## 2024-02-02 NOTE — Unmapped (Signed)
 Specialty Medication(s): Ruxolitinib discontinued.      [name] has been dis-enrolled from the [facility] specialty pharmacy services as a result of  patient is now taking hydroxyurea.  This is a spec-lite medication and patient is able to request refills through MyChart  .    Additional information provided to the patient: NA -next delivery scheduled on 02/03/24    Courtney Erickson, [facility]  [facility] Specialty Pharmacist

## 2024-03-05 MED FILL — HYDROXYUREA 500 MG CAPSULE: ORAL | 30 days supply | Qty: 60 | Fill #2

## 2024-03-30 ENCOUNTER — Other Ambulatory Visit: Admit: 2024-03-30 | Discharge: 2024-03-31 | Payer: Medicare (Managed Care)

## 2024-03-30 ENCOUNTER — Ambulatory Visit
Admit: 2024-03-30 | Discharge: 2024-03-31 | Payer: Medicare (Managed Care) | Attending: Hematology & Oncology | Primary: Hematology & Oncology

## 2024-03-30 DIAGNOSIS — E611 Iron deficiency: Principal | ICD-10-CM

## 2024-03-30 DIAGNOSIS — E785 Hyperlipidemia, unspecified: Principal | ICD-10-CM

## 2024-03-30 DIAGNOSIS — D45 Polycythemia vera: Principal | ICD-10-CM

## 2024-03-30 MED ORDER — HYDROXYUREA 500 MG CAPSULE
ORAL_CAPSULE | Freq: Every day | ORAL | 5 refills | 30.00000 days | Status: CP
Start: 2024-03-30 — End: 2024-09-26
  Filled 2024-04-06: qty 60, 30d supply, fill #0

## 2024-04-08 ENCOUNTER — Other Ambulatory Visit: Payer: Self-pay

## 2024-04-08 ENCOUNTER — Encounter: Payer: Self-pay | Admitting: Student in an Organized Health Care Education/Training Program

## 2024-04-08 ENCOUNTER — Ambulatory Visit (INDEPENDENT_AMBULATORY_CARE_PROVIDER_SITE_OTHER): Admitting: Student in an Organized Health Care Education/Training Program

## 2024-04-08 VITALS — BP 112/78 | HR 98 | Temp 97.9°F | Ht 59.0 in | Wt 162.8 lb

## 2024-04-08 DIAGNOSIS — F17200 Nicotine dependence, unspecified, uncomplicated: Secondary | ICD-10-CM | POA: Diagnosis not present

## 2024-04-08 DIAGNOSIS — Z122 Encounter for screening for malignant neoplasm of respiratory organs: Secondary | ICD-10-CM

## 2024-04-08 DIAGNOSIS — J432 Centrilobular emphysema: Secondary | ICD-10-CM

## 2024-04-08 DIAGNOSIS — G4733 Obstructive sleep apnea (adult) (pediatric): Secondary | ICD-10-CM | POA: Diagnosis not present

## 2024-04-08 DIAGNOSIS — Z87891 Personal history of nicotine dependence: Secondary | ICD-10-CM

## 2024-04-08 MED ORDER — TRELEGY ELLIPTA 200-62.5-25 MCG/ACT IN AEPB: 1.0000 | INHALATION_SPRAY | Freq: Every day | RESPIRATORY_TRACT | 11 refills | Status: AC

## 2024-04-08 NOTE — Progress Notes (Signed)
 Assessment & Plan:   #Centrilobular emphysema #PRISM   Presents for follow up of shortness of breath with PFT's from 06/2022 showing an FEV1 of 1.5L (68%) post bronchodilator challenge with preserved ratio (FEV1/FVC of 0.86). She has normal TLC and signs of air trapping (RV/TLC of 142% predicted). DLCO is normal at 81% predicted. Lung cancer screening CT's from [facility] showed emphysema .   Overall, this picture is consistent with PRISM  and suggests COPD. Will continue triple LABA/LAMA/ICS therapy. Given confusion with multiple inhalers and difficulty following instructions, I will switch her to unified triple therapy with Trelegy Ellipta . I had placed a referral to pulmonary rehab during a previous visit but this was not done. I will re-send the referral to pulmonary rehab. Finally, I will order a TTE to rule out pulmonary hypertension.  - AMB referral to pulmonary rehabilitation - ECHOCARDIOGRAM COMPLETE; Future - Fluticasone -Umeclidin-Vilant (TRELEGY ELLIPTA ) 200-62.5-25 MCG/ACT AEPB; Inhale 1 puff into the lungs daily.  Dispense: 60 each; Refill: 11  #Tobacco use disorder History of tobacco use, quit in 2022 with a significant smoking history. Will switch her lung cancer screening to our program given she's not had her scan for two years.  - Ambulatory Referral for Lung Cancer Screening  #OSA (obstructive sleep apnea) At high risk for OSA, ordering sleep study  - Home sleep test; Future  Return in about 3 months (around 07/09/2024).  I spent 31 minutes caring for this patient today, including preparing to see the patient, obtaining a medical history , reviewing a separately obtained history, performing a medically appropriate examination and/or evaluation, counseling and educating the patient/family/caregiver, ordering medications, tests, or procedures, documenting clinical information in the electronic health record, and independently interpreting results (not separately reported/billed)  and communicating results to the patient/family/caregiver  Vergia Glasgow, MD [facility]  End of visit medications:  Meds ordered this encounter  Medications   Fluticasone -Umeclidin-Vilant (TRELEGY ELLIPTA ) 200-62.5-25 MCG/ACT AEPB    Sig: Inhale 1 puff into the lungs daily.    Dispense:  60 each    Refill:  11     Current Outpatient Medications:    albuterol  (PROVENTIL ) (2.5 MG/3ML) 0.083% nebulizer solution, Take 3 mLs (2.5 mg total) by nebulization every 6 (six) hours as needed for wheezing or shortness of breath., Disp: 75 mL, Rfl: 12   albuterol  (VENTOLIN  HFA) 108 (90 Base) MCG/ACT inhaler, Inhale into the lungs., Disp: , Rfl:    ARIPiprazole  (ABILIFY ) 2 MG tablet, Take 2 mg by mouth daily., Disp: , Rfl:    aspirin  EC 81 MG tablet, Take 81 mg by mouth daily., Disp: , Rfl:    atorvastatin  (LIPITOR) 20 MG tablet, Take 20 mg by mouth at bedtime., Disp: , Rfl:    buPROPion  (WELLBUTRIN  XL) 150 MG 24 hr tablet, Take 150 mg by mouth every morning., Disp: , Rfl:    cetirizine (ZYRTEC) 10 MG tablet, Take 10 mg by mouth daily., Disp: , Rfl:    cyclobenzaprine  (FLEXERIL ) 10 MG tablet, Take 5-10 mg by mouth every evening., Disp: , Rfl:    escitalopram  (LEXAPRO ) 10 MG tablet, Take 10 mg by mouth daily., Disp: , Rfl:    esomeprazole (NEXIUM) 40 MG capsule, Take 40 mg by mouth daily. , Disp: , Rfl:    Fluticasone -Umeclidin-Vilant (TRELEGY ELLIPTA ) 200-62.5-25 MCG/ACT AEPB, Inhale 1 puff into the lungs daily., Disp: 60 each, Rfl: 11   folic acid  (FOLVITE ) 1 MG tablet, Take 1 mg by mouth daily., Disp: , Rfl:    isosorbide  mononitrate (  IMDUR ) 30 MG 24 hr tablet, Take 30 mg by mouth daily., Disp: , Rfl:    mirtazapine  (REMERON ) 15 MG tablet, Take 15 mg by mouth at bedtime., Disp: , Rfl:    Multiple Vitamins-Minerals (MULTIVIT/MULTIMINERAL ADULT PO), Take 1 tablet by mouth daily., Disp: , Rfl:    NITROGLYCERIN  SL, Place 1 tablet under the tongue., Disp: , Rfl:    propranolol   (INDERAL ) 40 MG tablet, Take 40 mg by mouth 2 (two) times daily., Disp: , Rfl:    Subjective:   PATIENT ID: Read Lauren GENDER: female DOB: 1966/08/23, MRN: [id number]  Chief Complaint  Patient presents with   Follow-up    Dyspnea, sx worse when laying down.    HPI  58 year old female with a self-reported history of bronchitis who presents to clinic for follow up.  She's had a recent COPD exacerbation since our last visit, treated with antibiotics and steroids. She was admitted between 01/07/2024 and 01/10/2024 for said COPD exacerbation. She continues to experience residual dyspnea as well as cough. She was confused with her inhaler therapy, and has only used the Spiriva  and has not used her Breo Ellipta  recently. She is also reporting feeling tired in the morning, multiple nocturnal awakenings, and snoring. She is concerned for sleep apnea.  Prior to this, she's been followed with us  in clinic for symptoms of exertional dyspnea and cough for which we prescribed Breo Ellipta  and Spiriva . The prior exacerbation was in July of 2024.   She's not on any new medications, and denies any alcohol or illicit drug use. She has no fevers, no stiffness, and no flushing. There has been no change to symptoms otherwise.   She denies any fevers, chills, hemoptysis, productive sputum, night sweats, weight loss, abdominal pain, chest pain, or chest tightness.  She has gained weight over the past couple of years (between 15 and 20 pounds) and this has coincided with worsening symptoms.  Her other providers include hematology at [facility] where she is managed for polycythemia vera.  She was getting yearly CT scans for lung cancer screening at [facility].  Review of the record does note that the CT mentions emphysema but otherwise normal lung parenchyma.   She used to work at [employer] but has not done so for many years and she is currently on disability.  She used to smoke (3 packs a day for at least 20 years) but quit a  year ago after her friend came down with cancer.  Ancillary information including prior medications, full medical/surgical/family/social histories, and PFTs (when available) are listed below and have been reviewed.   Review of Systems  Constitutional:  Negative for chills, fever and weight loss (weight gain).  Respiratory:  Positive for sputum production and shortness of breath. Negative for cough, hemoptysis and wheezing.   Cardiovascular:  Negative for chest pain.  Neurological:  Negative for tremors.     Objective:   Vitals:   04/08/24 1041  BP: 112/78  Pulse: 98  Temp: 97.9 F (36.6 C)  TempSrc: Oral  SpO2: 97%  Weight: 162 lb 12.8 oz (73.8 kg)  Height: 4' 11 (1.499 m)   97% on RA BMI Readings from Last 3 Encounters:  04/08/24 32.88 kg/m  01/07/24 33.53 kg/m  07/08/23 33.89 kg/m   Wt Readings from Last 3 Encounters:  04/08/24 162 lb 12.8 oz (73.8 kg)  01/07/24 166 lb (75.3 kg)  07/08/23 167 lb 12.8 oz (76.1 kg)    Physical Exam Constitutional:  Appearance: Normal appearance.  HENT:     Head: Normocephalic.     Mouth/Throat:     Comments: Crowded oropharynx  Eyes:     Extraocular Movements: Extraocular movements intact.     Pupils: Pupils are equal, round, and reactive to light.   Neck:     Comments: Large neck circumferance Cardiovascular:     Rate and Rhythm: Normal rate and regular rhythm.     Pulses: Normal pulses.     Heart sounds: Normal heart sounds.  Pulmonary:     Effort: Pulmonary effort is normal.     Breath sounds: Normal breath sounds. No wheezing.  Abdominal:     General: There is distension.     Palpations: Abdomen is soft.   Neurological:     General: No focal deficit present.     Mental Status: She is alert and oriented to person, place, and time. Mental status is at baseline.       Ancillary Information    Past Medical History:  Diagnosis Date   Abdominal pain    Abnormal mammogram    Anxiety    Asthma     Bleeding disorder (HCC)    Blood dyscrasia    polycythemia vera   Chronic kidney disease    COPD (chronic obstructive pulmonary disease) (HCC)    Cough    Depression    Diabetes mellitus without complication (HCC)    Fatigue    Headache 2000   Horizontal nystagmus    age 68   Hyperglycemia    Hypertension    Low back pain    Major depressive disorder    Nasal lesion    Neck pain    Snoring    SOB (shortness of breath)    Tobacco use    UTI (urinary tract infection)    Von Willebrand's disease (HCC)      Family History  Problem Relation Age of Onset   Cancer Father        prostate   Stroke Father    Diabetes Father    Cancer Mother        bladder   Breast cancer Maternal Grandmother      Past Surgical History:  Procedure Laterality Date   ABDOMINAL HYSTERECTOMY  2002   oophorectomy   ABDOMINAL HYSTERECTOMY     APPENDECTOMY     BLADDER SURGERY     BREAST BIOPSY Left    NEG   CARDIAC CATHETERIZATION     CARDIAC CATHETERIZATION N/A 07/17/2016   Procedure: Left Heart Cath and Coronary Angiography;  Surgeon: Michelle Aid, MD;  Location: [facility] INVASIVE CV LAB;  Service: Cardiovascular;  Laterality: N/A;   COLONOSCOPY WITH PROPOFOL  N/A 07/27/2015   Procedure: COLONOSCOPY WITH PROPOFOL ;  Surgeon: Cassie Click, MD;  Location: [facility] ENDOSCOPY;  Service: Endoscopy;  Laterality: N/A;   COLONOSCOPY WITH PROPOFOL  N/A 05/31/2019   Procedure: COLONOSCOPY WITH PROPOFOL ;  Surgeon: Selena Daily, MD;  Location: [facility] ENDOSCOPY;  Service: Gastroenterology;  Laterality: N/A;   ESOPHAGOGASTRODUODENOSCOPY N/A 06/18/2017   Procedure: ESOPHAGOGASTRODUODENOSCOPY (EGD);  Surgeon: Selena Daily, MD;  Location: [facility] ENDOSCOPY;  Service: Gastroenterology;  Laterality: N/A;   ESOPHAGOGASTRODUODENOSCOPY (EGD) WITH PROPOFOL  N/A 05/31/2019   Procedure: ESOPHAGOGASTRODUODENOSCOPY (EGD) WITH PROPOFOL ;  Surgeon: Selena Daily, MD;  Location: [facility] ENDOSCOPY;  Service:  Gastroenterology;  Laterality: N/A;   REPLACEMENT TOTAL KNEE Left    SHOULDER SURGERY Left    x 2    Social History   Socioeconomic History  Marital status: Single    Spouse name: Not on file   Number of children: Not on file   Years of education: Not on file   Highest education level: Not on file  Occupational History    Comment: disabled  Tobacco Use   Smoking status: Former    Current packs/day: 0.00    Average packs/day: 2.8 packs/day for 29.0 years (79.8 ttl pk-yrs)    Types: Cigarettes    Start date: 10/1991    Quit date: 10/2020    Years since quitting: 3.4   Smokeless tobacco: Never  Vaping Use   Vaping status: Never Used  Substance and Sexual Activity   Alcohol use: No    Alcohol/week: 0.0 standard drinks of alcohol   Drug use: No   Sexual activity: Not on file  Other Topics Concern   Not on file  Social History Narrative   single   Social Drivers of Health   Financial Resource Strain: Not on file  Food Insecurity: No Food Insecurity (01/08/2024)   Hunger Vital Sign    Worried About Running Out of Food in the Last Year: Never true    Ran Out of Food in the Last Year: Never true  Transportation Needs: No Transportation Needs (01/08/2024)   PRAPARE - Administrator, Civil Service (Medical): No    Lack of Transportation (Non-Medical): No  Physical Activity: Not on file  Stress: Not on file  Social Connections: Not on file  Intimate Partner Violence: Not At Risk (01/08/2024)   Humiliation, Afraid, Rape, and Kick questionnaire    Fear of Current or Ex-Partner: No    Emotionally Abused: No    Physically Abused: No    Sexually Abused: No     Allergies  Allergen Reactions   Aspirin      Able to tolerate low dose   Pregabalin Other (See Comments)    Unsteady gate, causing falls   Trazodone Other (See Comments)    weakness   Tramadol Rash     CBC    Component Value Date/Time   WBC 7.4 01/10/2024 0539   RBC 4.02 01/10/2024 0539   HGB  11.9 (L) 01/10/2024 0539   HGB 11.5 (L) 02/22/2015 0851   HCT 35.6 (L) 01/10/2024 0539   HCT 35.9 02/22/2015 0851   PLT 310 01/10/2024 0539   PLT 454 (H) 02/22/2015 0851   MCV 88.6 01/10/2024 0539   MCV 63 (L) 02/22/2015 0851   MCH 29.6 01/10/2024 0539   MCHC 33.4 01/10/2024 0539   RDW 13.4 01/10/2024 0539   RDW 18.6 (H) 02/22/2015 0851   LYMPHSABS 0.6 (L) 10/15/2022 0531   LYMPHSABS 2.0 02/22/2015 0851   MONOABS 0.6 10/15/2022 0531   MONOABS 0.4 02/22/2015 0851   EOSABS 0.1 10/15/2022 0531   EOSABS 0.4 02/22/2015 0851   BASOSABS 0.1 10/15/2022 0531   BASOSABS 0.1 02/22/2015 0851    Pulmonary Functions Testing Results:    Latest Ref Rng & Units 07/25/2022    2:02 PM  PFT Results  FVC-Pre L 1.58   FVC-Predicted Pre % 56   FVC-Post L 1.83   FVC-Predicted Post % 64   Pre FEV1/FVC % % 86   Post FEV1/FCV % % 82   FEV1-Pre L 1.35   FEV1-Predicted Pre % 61   FEV1-Post L 1.50   DLCO uncorrected ml/min/mmHg 14.12   DLCO UNC% % 81   DLVA Predicted % 93   TLC L 4.81   TLC % Predicted % 111  RV % Predicted % 142     Outpatient Medications Prior to Visit  Medication Sig Dispense Refill   albuterol  (PROVENTIL ) (2.5 MG/3ML) 0.083% nebulizer solution Take 3 mLs (2.5 mg total) by nebulization every 6 (six) hours as needed for wheezing or shortness of breath. 75 mL 12   albuterol  (VENTOLIN  HFA) 108 (90 Base) MCG/ACT inhaler Inhale into the lungs.     ARIPiprazole  (ABILIFY ) 2 MG tablet Take 2 mg by mouth daily.     aspirin  EC 81 MG tablet Take 81 mg by mouth daily.     atorvastatin  (LIPITOR) 20 MG tablet Take 20 mg by mouth at bedtime.     buPROPion  (WELLBUTRIN  XL) 150 MG 24 hr tablet Take 150 mg by mouth every morning.     cetirizine (ZYRTEC) 10 MG tablet Take 10 mg by mouth daily.     cyclobenzaprine  (FLEXERIL ) 10 MG tablet Take 5-10 mg by mouth every evening.     escitalopram  (LEXAPRO ) 10 MG tablet Take 10 mg by mouth daily.     esomeprazole (NEXIUM) 40 MG capsule Take 40 mg by  mouth daily.      folic acid  (FOLVITE ) 1 MG tablet Take 1 mg by mouth daily.     isosorbide  mononitrate (IMDUR ) 30 MG 24 hr tablet Take 30 mg by mouth daily.     mirtazapine  (REMERON ) 15 MG tablet Take 15 mg by mouth at bedtime.     Multiple Vitamins-Minerals (MULTIVIT/MULTIMINERAL ADULT PO) Take 1 tablet by mouth daily.     NITROGLYCERIN  SL Place 1 tablet under the tongue.     propranolol  (INDERAL ) 40 MG tablet Take 40 mg by mouth 2 (two) times daily.     Tiotropium Bromide  Monohydrate (SPIRIVA  RESPIMAT) 2.5 MCG/ACT AERS Inhale 2 puffs into the lungs daily.     No facility-administered medications prior to visit.

## 2024-04-08 NOTE — Patient Instructions (Addendum)
-  We will do trelegy inhaler, one puff once a day -I will send an order for a home sleep study to assess for sleep apnea -I will send a referral to pulmonary rehab, this is an exercise program to help with your breathing.  -I am going to order an ultrasound of your heart -I will refer you to our lung cancer screening program. This is a yearly CT that you will get for early detection of lung cancer.

## 2024-04-09 ENCOUNTER — Other Ambulatory Visit: Payer: Self-pay

## 2024-04-09 ENCOUNTER — Emergency Department
Admission: EM | Admit: 2024-04-09 | Discharge: 2024-04-09 | Disposition: A | Discharge status: Home / Self Care | Attending: Emergency Medicine | Admitting: Emergency Medicine

## 2024-04-09 ENCOUNTER — Emergency Department

## 2024-04-09 DIAGNOSIS — R0602 Shortness of breath: Secondary | ICD-10-CM | POA: Insufficient documentation

## 2024-04-09 DIAGNOSIS — I2510 Atherosclerotic heart disease of native coronary artery without angina pectoris: Secondary | ICD-10-CM | POA: Diagnosis not present

## 2024-04-09 DIAGNOSIS — R0789 Other chest pain: Secondary | ICD-10-CM | POA: Insufficient documentation

## 2024-04-09 DIAGNOSIS — J449 Chronic obstructive pulmonary disease, unspecified: Secondary | ICD-10-CM | POA: Insufficient documentation

## 2024-04-09 DIAGNOSIS — R059 Cough, unspecified: Secondary | ICD-10-CM | POA: Insufficient documentation

## 2024-04-09 DIAGNOSIS — E119 Type 2 diabetes mellitus without complications: Secondary | ICD-10-CM | POA: Diagnosis not present

## 2024-04-09 LAB — CBC
HCT: 39 % (ref 36.0–46.0)
Hemoglobin: 13.2 g/dL (ref 12.0–15.0)
MCH: 34.2 pg — ABNORMAL HIGH (ref 26.0–34.0)
MCHC: 33.8 g/dL (ref 30.0–36.0)
MCV: 101 fL — ABNORMAL HIGH (ref 80.0–100.0)
Platelets: 260 10*3/uL (ref 150–400)
RBC: 3.86 MIL/uL — ABNORMAL LOW (ref 3.87–5.11)
RDW: 18.1 % — ABNORMAL HIGH (ref 11.5–15.5)
WBC: 5.1 10*3/uL (ref 4.0–10.5)
nRBC: 0 % (ref 0.0–0.2)

## 2024-04-09 LAB — COMPREHENSIVE METABOLIC PANEL WITH GFR
ALT: 39 U/L (ref 0–44)
AST: 58 U/L — ABNORMAL HIGH (ref 15–41)
Albumin: 3.9 g/dL (ref 3.5–5.0)
Alkaline Phosphatase: 60 U/L (ref 38–126)
Anion gap: 8 (ref 5–15)
BUN: 14 mg/dL (ref 6–20)
CO2: 24 mmol/L (ref 22–32)
Calcium: 9 mg/dL (ref 8.9–10.3)
Chloride: 106 mmol/L (ref 98–111)
Creatinine, Ser: 0.52 mg/dL (ref 0.44–1.00)
GFR, Estimated: >60 mL/min (ref >60)
Glucose, Bld: 119 mg/dL — ABNORMAL HIGH (ref 70–99)
Potassium: 4 mmol/L (ref 3.5–5.1)
Sodium: 138 mmol/L (ref 135–145)
Total Bilirubin: 1 mg/dL (ref 0.0–1.2)
Total Protein: 6.5 g/dL (ref 6.5–8.1)

## 2024-04-09 LAB — TROPONIN I (HIGH SENSITIVITY): Troponin I (High Sensitivity): 3 ng/L (ref <18)

## 2024-04-09 MED ORDER — IPRATROPIUM-ALBUTEROL 0.5-2.5 (3) MG/3ML IN SOLN
3.0000 mL | Freq: Once | RESPIRATORY_TRACT | Status: AC
  Administered 2024-04-09: 3 mL via RESPIRATORY_TRACT
  Filled 2024-04-09: qty 3

## 2024-04-09 MED ORDER — METHYLPREDNISOLONE SODIUM SUCC 125 MG IJ SOLR
125.0000 mg | Freq: Once | INTRAMUSCULAR | Status: AC
  Administered 2024-04-09: 125 mg via INTRAVENOUS
  Filled 2024-04-09: qty 2

## 2024-04-09 MED ORDER — PREDNISONE 50 MG PO TABS: 50.0000 mg | ORAL_TABLET | Freq: Every day | ORAL | 0 refills | Status: DC

## 2024-04-09 NOTE — ED Notes (Signed)
 Fall precautions in place for Pt. This RN placed fall band, fall grip socks, bed alarm and fall sign.

## 2024-04-09 NOTE — ED Notes (Signed)
 Fall risk measures taken. Fall risk bracelet applied to pt. Pt is wearing shoes and wants to keep them on as of now. Bed alarm on and in place.

## 2024-04-09 NOTE — ED Provider Notes (Signed)
 [facility] Provider Note    Event Date/Time   First MD Initiated Contact with Patient 04/09/24 1059     (approximate)   History   Chest pain, shortness of breath   HPI  Lauren Escobar is a 58 y.o. female with history of COPD on 4 L nasal cannula, diabetes, CAD who presents with complaints of chest discomfort, mild increase shortness of breath.  No fevers reported, she does have a cough.     Physical Exam   Triage Vital Signs: ED Triage Vitals  Encounter Vitals Group     BP 04/09/24 1100 126/87     Girls Systolic BP Percentile --      Girls Diastolic BP Percentile --      Boys Systolic BP Percentile --      Boys Diastolic BP Percentile --      Pulse Rate 04/09/24 1100 65     Resp 04/09/24 1100 17     Temp 04/09/24 1100 99.3 F (37.4 C)     Temp Source 04/09/24 1104 Oral     SpO2 04/09/24 1100 99 %     Weight 04/09/24 1105 82.9 kg (182 lb 12.8 oz)     Height 04/09/24 1105 1.499 m (4' 11)     Head Circumference --      Peak Flow --      Pain Score 04/09/24 1101 7     Pain Loc --      Pain Education --      Exclude from Growth Chart --     Most recent vital signs: Vitals:   04/09/24 1230 04/09/24 1259  BP: 103/65   Pulse: 79   Resp: 14   Temp:  98.4 F (36.9 C)  SpO2: 98%      General: Awake, no distress.  CV:  Good peripheral perfusion.  Resp:  Mild tachypnea, scattered wheezing Abd:  No distention.  Other:  No calf pain or swelling   ED Results / Procedures / Treatments   Labs (all labs ordered are listed, but only abnormal results are displayed) Labs Reviewed  CBC - Abnormal; Notable for the following components:      Result Value   RBC 3.86 (*)    MCV 101.0 (*)    MCH 34.2 (*)    RDW 18.1 (*)    All other components within normal limits  COMPREHENSIVE METABOLIC PANEL WITH GFR - Abnormal; Notable for the following components:   Glucose, Bld 119 (*)    AST 58 (*)    All other components within normal limits   TROPONIN I (HIGH SENSITIVITY)     EKG  ED ECG REPORT I, Bryson Carbine, the attending physician, personally viewed and interpreted this ECG.  Date: 04/09/2024  Rhythm: normal sinus rhythm QRS Axis: normal Intervals: normal ST/T Wave abnormalities: Nonspecific changes inferiorly Narrative Interpretation: no evidence of acute ischemia    RADIOLOGY Chest x-ray viewed interpret by me, no evidence of pneumonia    PROCEDURES:  Critical Care performed:   Procedures   MEDICATIONS ORDERED IN ED: Medications  ipratropium-albuterol  (DUONEB) 0.5-2.5 (3) MG/3ML nebulizer solution 3 mL (3 mLs Nebulization Given 04/09/24 1134)  ipratropium-albuterol  (DUONEB) 0.5-2.5 (3) MG/3ML nebulizer solution 3 mL (3 mLs Nebulization Given 04/09/24 1133)  methylPREDNISolone  sodium succinate (SOLU-MEDROL ) 125 mg/2 mL injection 125 mg (125 mg Intravenous Given 04/09/24 1133)     IMPRESSION / MDM / ASSESSMENT AND PLAN / ED COURSE  I reviewed the triage vital signs and  the nursing notes. Patient's presentation is most consistent with acute presentation with potential threat to life or bodily function.  Patient presents with shortness of breath, chest discomfort as detailed above, differential includes COPD exacerbation, pneumonia, ACS  EKG is reassuring, pending labs high sensitive troponin, chest x-ray, will treat with Solu-Medrol , DuoNebs and reevaluate  High sensitive troponin, lab work is reassuring, no evidence of pneumothorax or pneumonia on chest x-ray, patient feeling much better after treatment.  Vital signs normal, high sensitive troponin normal.  Chest x-ray without evidence of pneumonia.  Appropriate for discharge at this time with strict return precautions, she agrees to this plan.      FINAL CLINICAL IMPRESSION(S) / ED DIAGNOSES   Final diagnoses:  Atypical chest pain     Rx / DC Orders   ED Discharge Orders          Ordered    predniSONE  (DELTASONE ) 50 MG tablet  Daily  with breakfast        04/09/24 1245             Note:  This document was prepared using Dragon voice recognition software and may include unintentional dictation errors.   Bryson Carbine, MD 04/09/24 [phone]

## 2024-04-09 NOTE — ED Triage Notes (Signed)
 Pt comes in via pov with complaints of sore throat the past 2 days. No fevers reported. No signs of acute distress at this time.

## 2024-04-09 NOTE — ED Triage Notes (Signed)
 Pt arrives from home via EMS from doctors office Went in as she has had SOB, and chest pain on going for a few days EMS assessment: clear lungs chest pain  100% 4L @ baseline, 10/10 chest pain gave her meds, 132/78, HR 78, no CBG, temp 97.1 orally  Cardiac Hx, COPD Nitro-paste and baby ASA for chest pain given by [facility] 4/10 post intervention EMS 12 lead 78 BPM, unremarkable per EMS  #20 RAC by EMS

## 2024-04-12 ENCOUNTER — Emergency Department

## 2024-04-12 ENCOUNTER — Observation Stay
Admission: EM | Admit: 2024-04-12 | Discharge: 2024-04-13 | Disposition: A | Discharge status: Home / Self Care | Attending: Internal Medicine | Admitting: Internal Medicine

## 2024-04-12 ENCOUNTER — Other Ambulatory Visit: Payer: Self-pay

## 2024-04-12 DIAGNOSIS — R059 Cough, unspecified: Secondary | ICD-10-CM | POA: Diagnosis present

## 2024-04-12 DIAGNOSIS — F419 Anxiety disorder, unspecified: Secondary | ICD-10-CM | POA: Diagnosis not present

## 2024-04-12 DIAGNOSIS — Z7982 Long term (current) use of aspirin: Secondary | ICD-10-CM | POA: Insufficient documentation

## 2024-04-12 DIAGNOSIS — I129 Hypertensive chronic kidney disease with stage 1 through stage 4 chronic kidney disease, or unspecified chronic kidney disease: Secondary | ICD-10-CM | POA: Diagnosis not present

## 2024-04-12 DIAGNOSIS — E66811 Obesity, class 1: Secondary | ICD-10-CM | POA: Insufficient documentation

## 2024-04-12 DIAGNOSIS — Z6832 Body mass index (BMI) 32.0-32.9, adult: Secondary | ICD-10-CM | POA: Insufficient documentation

## 2024-04-12 DIAGNOSIS — Z79899 Other long term (current) drug therapy: Secondary | ICD-10-CM | POA: Insufficient documentation

## 2024-04-12 DIAGNOSIS — J211 Acute bronchiolitis due to human metapneumovirus: Secondary | ICD-10-CM | POA: Diagnosis not present

## 2024-04-12 DIAGNOSIS — N189 Chronic kidney disease, unspecified: Secondary | ICD-10-CM | POA: Diagnosis not present

## 2024-04-12 DIAGNOSIS — K219 Gastro-esophageal reflux disease without esophagitis: Secondary | ICD-10-CM | POA: Insufficient documentation

## 2024-04-12 DIAGNOSIS — J441 Chronic obstructive pulmonary disease with (acute) exacerbation: Secondary | ICD-10-CM | POA: Diagnosis not present

## 2024-04-12 DIAGNOSIS — B9781 Human metapneumovirus as the cause of diseases classified elsewhere: Secondary | ICD-10-CM | POA: Insufficient documentation

## 2024-04-12 DIAGNOSIS — Z96652 Presence of left artificial knee joint: Secondary | ICD-10-CM | POA: Insufficient documentation

## 2024-04-12 DIAGNOSIS — Z87891 Personal history of nicotine dependence: Secondary | ICD-10-CM | POA: Diagnosis not present

## 2024-04-12 DIAGNOSIS — E1165 Type 2 diabetes mellitus with hyperglycemia: Secondary | ICD-10-CM | POA: Diagnosis not present

## 2024-04-12 DIAGNOSIS — R051 Acute cough: Secondary | ICD-10-CM

## 2024-04-12 DIAGNOSIS — E1142 Type 2 diabetes mellitus with diabetic polyneuropathy: Secondary | ICD-10-CM | POA: Insufficient documentation

## 2024-04-12 DIAGNOSIS — E1122 Type 2 diabetes mellitus with diabetic chronic kidney disease: Secondary | ICD-10-CM | POA: Insufficient documentation

## 2024-04-12 DIAGNOSIS — J449 Chronic obstructive pulmonary disease, unspecified: Secondary | ICD-10-CM | POA: Diagnosis present

## 2024-04-12 DIAGNOSIS — I2510 Atherosclerotic heart disease of native coronary artery without angina pectoris: Secondary | ICD-10-CM | POA: Insufficient documentation

## 2024-04-12 DIAGNOSIS — F319 Bipolar disorder, unspecified: Secondary | ICD-10-CM | POA: Insufficient documentation

## 2024-04-12 DIAGNOSIS — J45909 Unspecified asthma, uncomplicated: Secondary | ICD-10-CM | POA: Insufficient documentation

## 2024-04-12 LAB — RESP PANEL BY RT-PCR (RSV, FLU A&B, COVID)  RVPGX2
Influenza A by PCR: NEGATIVE
Influenza B by PCR: NEGATIVE
Resp Syncytial Virus by PCR: NEGATIVE
SARS Coronavirus 2 by RT PCR: NEGATIVE

## 2024-04-12 LAB — D-DIMER, QUANTITATIVE: D-Dimer, Quant: <0.27 ug{FEU}/mL (ref 0.00–0.50)

## 2024-04-12 LAB — BLOOD GAS, VENOUS
Acid-Base Excess: 2.2 mmol/L — ABNORMAL HIGH (ref 0.0–2.0)
Bicarbonate: 27.3 mmol/L (ref 20.0–28.0)
O2 Saturation: 38 %
Patient temperature: 37
pCO2, Ven: 43 mmHg — ABNORMAL LOW (ref 44–60)
pH, Ven: 7.41 (ref 7.25–7.43)

## 2024-04-12 LAB — TROPONIN I (HIGH SENSITIVITY)
Troponin I (High Sensitivity): 4 ng/L (ref <18)
Troponin I (High Sensitivity): 3 ng/L (ref <18)

## 2024-04-12 LAB — RESPIRATORY PANEL BY PCR

## 2024-04-12 LAB — CBC
HCT: 39.4 % (ref 36.0–46.0)
Hemoglobin: 13.5 g/dL (ref 12.0–15.0)
MCH: 35.2 pg — ABNORMAL HIGH (ref 26.0–34.0)
MCHC: 34.3 g/dL (ref 30.0–36.0)
MCV: 102.6 fL — ABNORMAL HIGH (ref 80.0–100.0)
Platelets: 289 10*3/uL (ref 150–400)
RBC: 3.84 MIL/uL — ABNORMAL LOW (ref 3.87–5.11)
RDW: 18 % — ABNORMAL HIGH (ref 11.5–15.5)
WBC: 12.6 10*3/uL — ABNORMAL HIGH (ref 4.0–10.5)
nRBC: 0 % (ref 0.0–0.2)

## 2024-04-12 LAB — BASIC METABOLIC PANEL WITH GFR
Anion gap: 9 (ref 5–15)
BUN: 15 mg/dL (ref 6–20)
CO2: 22 mmol/L (ref 22–32)
Calcium: 8.5 mg/dL — ABNORMAL LOW (ref 8.9–10.3)
Chloride: 108 mmol/L (ref 98–111)
Creatinine, Ser: 0.72 mg/dL (ref 0.44–1.00)
GFR, Estimated: >60 mL/min (ref >60)
Glucose, Bld: 122 mg/dL — ABNORMAL HIGH (ref 70–99)
Potassium: 3.8 mmol/L (ref 3.5–5.1)
Sodium: 139 mmol/L (ref 135–145)

## 2024-04-12 LAB — BRAIN NATRIURETIC PEPTIDE: B Natriuretic Peptide: 191.8 pg/mL — ABNORMAL HIGH (ref 0.0–100.0)

## 2024-04-12 MED ORDER — HYDROXYUREA 500 MG PO CAPS
500.0000 mg | ORAL_CAPSULE | Freq: Two times a day (BID) | ORAL | Status: DC
  Administered 2024-04-12 – 2024-04-13 (×2): 500 mg via ORAL
  Filled 2024-04-12 (×4): qty 1

## 2024-04-12 MED ORDER — ATORVASTATIN CALCIUM 20 MG PO TABS: 20.0000 mg | ORAL_TABLET | Freq: Every day | ORAL | Status: DC

## 2024-04-12 MED ORDER — HYDROMORPHONE HCL 1 MG/ML IJ SOLN
0.5000 mg | Freq: Once | INTRAMUSCULAR | Status: AC
  Administered 2024-04-12: 0.5 mg via INTRAVENOUS
  Filled 2024-04-12: qty 0.5

## 2024-04-12 MED ORDER — BUPROPION HCL ER (XL) 150 MG PO TB24: 150.0000 mg | ORAL_TABLET | Freq: Every morning | ORAL | Status: DC

## 2024-04-12 MED ORDER — DOXYCYCLINE HYCLATE 100 MG PO TABS
100.0000 mg | ORAL_TABLET | Freq: Two times a day (BID) | ORAL | Status: DC
Start: 2024-04-12 — End: 2024-04-13
  Administered 2024-04-12 – 2024-04-13 (×2): 100 mg via ORAL
  Filled 2024-04-12 (×2): qty 1

## 2024-04-12 MED ORDER — IPRATROPIUM-ALBUTEROL 0.5-2.5 (3) MG/3ML IN SOLN
3.0000 mL | Freq: Once | RESPIRATORY_TRACT | Status: AC
  Administered 2024-04-12: 3 mL via RESPIRATORY_TRACT
  Filled 2024-04-12: qty 3

## 2024-04-12 MED ORDER — CYCLOBENZAPRINE HCL 10 MG PO TABS: 5.0000 mg | ORAL_TABLET | Freq: Every evening | ORAL | Status: DC

## 2024-04-12 MED ORDER — HYDROCODONE-ACETAMINOPHEN 7.5-325 MG PO TABS
1.0000 | ORAL_TABLET | Freq: Four times a day (QID) | ORAL | Status: DC | PRN
  Administered 2024-04-12 – 2024-04-13 (×2): 1 via ORAL
  Filled 2024-04-12 (×2): qty 1

## 2024-04-12 MED ORDER — ESCITALOPRAM OXALATE 10 MG PO TABS: 10.0000 mg | ORAL_TABLET | Freq: Every day | ORAL | Status: DC

## 2024-04-12 MED ORDER — ONDANSETRON HCL 4 MG/2ML IJ SOLN
4.0000 mg | Freq: Once | INTRAMUSCULAR | Status: AC
  Administered 2024-04-12: 4 mg via INTRAVENOUS
  Filled 2024-04-12: qty 2

## 2024-04-12 MED ORDER — ISOSORBIDE MONONITRATE ER 60 MG PO TB24: 30.0000 mg | ORAL_TABLET | Freq: Every day | ORAL | Status: DC

## 2024-04-12 MED ORDER — MIRTAZAPINE 15 MG PO TABS
15.0000 mg | ORAL_TABLET | Freq: Every day | ORAL | Status: DC
  Administered 2024-04-12: 15 mg via ORAL
  Filled 2024-04-12: qty 1

## 2024-04-12 MED ORDER — ARIPIPRAZOLE 2 MG PO TABS: 2.0000 mg | ORAL_TABLET | Freq: Every day | ORAL | Status: DC

## 2024-04-12 MED ORDER — LORATADINE 10 MG PO TABS
10.0000 mg | ORAL_TABLET | Freq: Every day | ORAL | Status: DC
  Administered 2024-04-13: 10 mg via ORAL
  Filled 2024-04-12: qty 1

## 2024-04-12 MED ORDER — PROPRANOLOL HCL 20 MG PO TABS: 40.0000 mg | ORAL_TABLET | Freq: Two times a day (BID) | ORAL | Status: DC

## 2024-04-12 MED ORDER — ALBUTEROL SULFATE (2.5 MG/3ML) 0.083% IN NEBU: 2.5000 mg | INHALATION_SOLUTION | Freq: Four times a day (QID) | RESPIRATORY_TRACT | Status: DC | PRN

## 2024-04-12 MED ORDER — SODIUM CHLORIDE 0.9 % IV BOLUS
1000.0000 mL | Freq: Once | INTRAVENOUS | Status: AC
  Administered 2024-04-12: 1000 mL via INTRAVENOUS

## 2024-04-12 MED ORDER — BENZONATATE 100 MG PO CAPS
100.0000 mg | ORAL_CAPSULE | Freq: Three times a day (TID) | ORAL | Status: DC | PRN
  Administered 2024-04-12 – 2024-04-13 (×2): 100 mg via ORAL
  Filled 2024-04-12 (×2): qty 1

## 2024-04-12 MED ORDER — ENOXAPARIN SODIUM 40 MG/0.4ML IJ SOSY
40.0000 mg | PREFILLED_SYRINGE | INTRAMUSCULAR | Status: DC
  Administered 2024-04-12: 40 mg via SUBCUTANEOUS
  Filled 2024-04-12: qty 0.4

## 2024-04-12 MED ORDER — SODIUM CHLORIDE 0.9 % IV SOLN
2.0000 g | Freq: Once | INTRAVENOUS | Status: AC
  Administered 2024-04-12: 2 g via INTRAVENOUS
  Filled 2024-04-12: qty 20

## 2024-04-12 MED ORDER — METHYLPREDNISOLONE SODIUM SUCC 40 MG IJ SOLR
40.0000 mg | Freq: Two times a day (BID) | INTRAMUSCULAR | Status: AC
  Administered 2024-04-12 – 2024-04-13 (×2): 40 mg via INTRAVENOUS
  Filled 2024-04-12 (×2): qty 1

## 2024-04-12 MED ORDER — PREDNISONE 20 MG PO TABS: 40.0000 mg | ORAL_TABLET | Freq: Every day | ORAL | Status: DC

## 2024-04-12 MED ORDER — GUAIFENESIN ER 600 MG PO TB12
1200.0000 mg | ORAL_TABLET | Freq: Two times a day (BID) | ORAL | Status: DC
  Administered 2024-04-12 – 2024-04-13 (×3): 1200 mg via ORAL
  Filled 2024-04-12 (×3): qty 2

## 2024-04-12 MED ORDER — PANTOPRAZOLE SODIUM 40 MG PO TBEC
40.0000 mg | DELAYED_RELEASE_TABLET | Freq: Every day | ORAL | Status: DC
  Administered 2024-04-13: 40 mg via ORAL
  Filled 2024-04-12: qty 1

## 2024-04-12 MED ORDER — IPRATROPIUM-ALBUTEROL 0.5-2.5 (3) MG/3ML IN SOLN
3.0000 mL | Freq: Four times a day (QID) | RESPIRATORY_TRACT | Status: DC
  Administered 2024-04-12 – 2024-04-13 (×5): 3 mL via RESPIRATORY_TRACT
  Filled 2024-04-12 (×5): qty 3

## 2024-04-12 MED ORDER — ASPIRIN 81 MG PO TBEC
81.0000 mg | DELAYED_RELEASE_TABLET | Freq: Every day | ORAL | Status: DC
  Administered 2024-04-13: 81 mg via ORAL
  Filled 2024-04-12: qty 1

## 2024-04-12 MED ORDER — METHYLPREDNISOLONE SODIUM SUCC 125 MG IJ SOLR
125.0000 mg | Freq: Once | INTRAMUSCULAR | Status: AC
  Administered 2024-04-12: 125 mg via INTRAVENOUS
  Filled 2024-04-12: qty 2

## 2024-04-12 MED ORDER — HYDRALAZINE HCL 20 MG/ML IJ SOLN: 5.0000 mg | Freq: Four times a day (QID) | INTRAMUSCULAR | Status: DC | PRN

## 2024-04-12 MED ORDER — DOXYCYCLINE HYCLATE 100 MG PO TABS
100.0000 mg | ORAL_TABLET | Freq: Once | ORAL | Status: AC
  Administered 2024-04-12: 100 mg via ORAL
  Filled 2024-04-12: qty 1

## 2024-04-12 MED ORDER — BUDESON-GLYCOPYRROL-FORMOTEROL 160-9-4.8 MCG/ACT IN AERO
2.0000 | INHALATION_SPRAY | Freq: Two times a day (BID) | RESPIRATORY_TRACT | Status: DC
  Administered 2024-04-12 – 2024-04-13 (×2): 2 via RESPIRATORY_TRACT
  Filled 2024-04-12: qty 5.9

## 2024-04-12 NOTE — ED Notes (Incomplete)
 Full rainbow sent to lab.

## 2024-04-12 NOTE — ED Triage Notes (Signed)
 Pt comes with c/o cough. Pt states hx of copd. Pt stats this started on Friday and was brought over here then to the ED and seen. Pt stats sob and cough and has gotten worse. Pt states pain in lungs.  Pt states cp mid sternal still ongoing. Pt states pain is 9/10 right now. Pt states pain to breath.

## 2024-04-12 NOTE — ED Provider Notes (Signed)
 [facility] Provider Note    Event Date/Time   First MD Initiated Contact with Patient 04/12/24 [phone]     (approximate)   History   Cough   HPI  CAMELLA SEIM is a 58 y.o. female with COPD on 4 L, diabetes, coronary disease who comes in with concerns for cough.  I reviewed the note where patient was seen on 6/13 with chest x-ray that was negative, negative troponin she was discharged home with course of prednisone .  She reports worsening pain and pain with breathing.  She reports frequent coughing that is dry in nature.  She reports chest pain with coughing and some shortness of breath associated with it.  She denies any known fevers.   Physical Exam   Triage Vital Signs: ED Triage Vitals  Encounter Vitals Group     BP 04/12/24 0716 105/67     Girls Systolic BP Percentile --      Girls Diastolic BP Percentile --      Boys Systolic BP Percentile --      Boys Diastolic BP Percentile --      Pulse Rate 04/12/24 0716 91     Resp 04/12/24 0716 20     Temp 04/12/24 0716 98 F (36.7 C)     Temp src --      SpO2 04/12/24 0714 100 %     Weight 04/12/24 0715 182 lb 12.8 oz (82.9 kg)     Height 04/12/24 0715 4' 11 (1.499 m)     Head Circumference --      Peak Flow --      Pain Score 04/12/24 0715 9     Pain Loc --      Pain Education --      Exclude from Growth Chart --     Most recent vital signs: Vitals:   04/12/24 0714 04/12/24 0716  BP:  105/67  Pulse:  91  Resp:  20  Temp:  98 F (36.7 C)  SpO2: 100% 100%     General: Awake, no distress.  CV:  Good peripheral perfusion.  Resp:  Normal effort.  Slight wheeze noted to the posterior aspect Abd:  No distention.  Soft nontender Other:  No swelling in legs.  No calf tenderness   ED Results / Procedures / Treatments   Labs (all labs ordered are listed, but only abnormal results are displayed) Labs Reviewed  BASIC METABOLIC PANEL WITH GFR  CBC  TROPONIN I (HIGH SENSITIVITY)      EKG  My interpretation of EKG:  Normal sinus rate of 88 without any ST elevation or T wave inversions, normal intervals  RADIOLOGY I have reviewed the xray personally and no focal pneumonia   PROCEDURES:  Critical Care performed: No  Procedures   MEDICATIONS ORDERED IN ED: Medications  aspirin  EC tablet 81 mg (has no administration in time range)  hydroxyurea (HYDREA) capsule 500 mg (has no administration in time range)  atorvastatin  (LIPITOR) tablet 20 mg (has no administration in time range)  mirtazapine  (REMERON ) tablet 15 mg (has no administration in time range)  pantoprazole  (PROTONIX ) EC tablet 40 mg (has no administration in time range)  albuterol  (PROVENTIL ) (2.5 MG/3ML) 0.083% nebulizer solution 2.5 mg (has no administration in time range)  loratadine  (CLARITIN ) tablet 10 mg (has no administration in time range)  budesonide-glycopyrrolate -formoterol  (BREZTRI) 160-9-4.8 MCG/ACT inhaler 2 puff (has no administration in time range)  enoxaparin  (LOVENOX ) injection 40 mg (has no administration in time range)  doxycycline (VIBRA-TABS) tablet 100 mg (has no administration in time range)  methylPREDNISolone  sodium succinate (SOLU-MEDROL ) 40 mg/mL injection 40 mg (has no administration in time range)    Followed by  predniSONE  (DELTASONE ) tablet 40 mg (has no administration in time range)  ipratropium-albuterol  (DUONEB) 0.5-2.5 (3) MG/3ML nebulizer solution 3 mL (has no administration in time range)  guaiFENesin  (MUCINEX ) 12 hr tablet 1,200 mg (has no administration in time range)  benzonatate (TESSALON) capsule 100 mg (has no administration in time range)  hydrALAZINE  (APRESOLINE ) injection 5 mg (has no administration in time range)  ipratropium-albuterol  (DUONEB) 0.5-2.5 (3) MG/3ML nebulizer solution 3 mL (3 mLs Nebulization Given 04/12/24 0816)  methylPREDNISolone  sodium succinate (SOLU-MEDROL ) 125 mg/2 mL injection 125 mg (125 mg Intravenous Given 04/12/24 0816)   HYDROmorphone  (DILAUDID ) injection 0.5 mg (0.5 mg Intravenous Given 04/12/24 0817)  ondansetron  (ZOFRAN ) injection 4 mg (4 mg Intravenous Given 04/12/24 0817)  sodium chloride  0.9 % bolus 1,000 mL (1,000 mLs Intravenous New Bag/Given 04/12/24 0937)  cefTRIAXone  (ROCEPHIN ) 2 g in sodium chloride  0.9 % 100 mL IVPB (2 g Intravenous New Bag/Given 04/12/24 1011)  ipratropium-albuterol  (DUONEB) 0.5-2.5 (3) MG/3ML nebulizer solution 3 mL (3 mLs Nebulization Given 04/12/24 1011)  ipratropium-albuterol  (DUONEB) 0.5-2.5 (3) MG/3ML nebulizer solution 3 mL (3 mLs Nebulization Given 04/12/24 1010)  doxycycline (VIBRA-TABS) tablet 100 mg (100 mg Oral Given 04/12/24 1011)     IMPRESSION / MDM / ASSESSMENT AND PLAN / ED COURSE  I reviewed the triage vital signs and the nursing notes.   Patient's presentation is most consistent with acute presentation with potential threat to life or bodily function.    Differential includes ACS, pneumonia, COVID, flu, bronchitis, COPD.  Trial of DuoNebs, steroids.  9:48 AM Discussed with radiology given the course findings were not there a few days ago.  He did not think it represented edema he felt it looked more consistent with a bronchitis picture.  BNP only slightly elevated and blood pressures are little soft so we will give some IV fluid.  Troponin and D-dimer are negative unlikely PE.  On reevaluation patient continues to have work of breathing.  Will discuss with hospital team for admission    The patient is on the cardiac monitor to evaluate for evidence of arrhythmia and/or significant heart rate changes.      FINAL CLINICAL IMPRESSION(S) / ED DIAGNOSES   Final diagnoses:  COPD exacerbation (HCC)  Acute cough     Rx / DC Orders   ED Discharge Orders     None        Note:  This document was prepared using Dragon voice recognition software and may include unintentional dictation errors.   Lubertha Rush, MD 04/12/24 [phone]

## 2024-04-12 NOTE — H&P (Signed)
 History and Physical    Lauren Escobar ZHY:[id number] DOB: [birth year] DOA: 04/12/2024  PCP: Lauren Frater, MD (Confirm with patient/family/NH records and if not entered, this has to be entered at [facility] point of entry) Patient coming from: Home  I have personally briefly reviewed patient's old medical records in [facility]  Chief Complaint: Cough, wheezing, SOB  HPI: Lauren Escobar is a 58 y.o. female with medical history significant of COPD Gold stage II, HTN, HLD, GERD, polycythemia vera, presented with worsening of cough wheezing shortness of breath.  Symptoms started 7 days ago, patient started to have cough and wheezing for which she has been using around-the-clock albuterol  inhaler with some relief.  Friday she came to ED for the first time for worsening of cough wheezing shortness of breath, she was diagnosed with COPD exacerbation, and sent home with p.o. prednisone .  Initially she started to feel better however since Sunday her symptoms has become worse.  Denied any chest pain no fever or chills.  She could not sleep overnight because of the cough and wheezing and decided to come to ED this morning.  ED Course: Afebrile, no tachycardia nonhypotensive blood pressure 110/80 O2 saturation 91% on room air.  Blood work showed WBC 12.6.  Chest x-ray showed chronic interstitial changes versus bronchitis.  Patient was given Solu-Medrol  125 mg and breathing status was improved however became dizzy and cough again.  Review of Systems: As per HPI otherwise 14 point review of systems negative.    Past Medical History:  Diagnosis Date   Abdominal pain    Abnormal mammogram    Anxiety    Asthma    Bleeding disorder (HCC)    Blood dyscrasia    polycythemia vera   Chronic kidney disease    COPD (chronic obstructive pulmonary disease) (HCC)    Cough    Depression    Diabetes mellitus without complication (HCC)    Fatigue    Headache 2000   Horizontal nystagmus    age  21   Hyperglycemia    Hypertension    Low back pain    Major depressive disorder    Nasal lesion    Neck pain    Snoring    SOB (shortness of breath)    Tobacco use    UTI (urinary tract infection)    Von Willebrand's disease (HCC)     Past Surgical History:  Procedure Laterality Date   ABDOMINAL HYSTERECTOMY  2002   oophorectomy   ABDOMINAL HYSTERECTOMY     APPENDECTOMY     BLADDER SURGERY     BREAST BIOPSY Left    NEG   CARDIAC CATHETERIZATION     CARDIAC CATHETERIZATION N/A 07/17/2016   Procedure: Left Heart Cath and Coronary Angiography;  Surgeon: Lauren Aid, MD;  Location: [facility] INVASIVE CV LAB;  Service: Cardiovascular;  Laterality: N/A;   COLONOSCOPY WITH PROPOFOL  N/A 07/27/2015   Procedure: COLONOSCOPY WITH PROPOFOL ;  Surgeon: Lauren Click, MD;  Location[facility] ENDOSCOPY;  Service: Endoscopy;  Laterality: N/A;   COLONOSCOPY WITH PROPOFOL  N/A 05/31/2019   Procedure: COLONOSCOPY WITH PROPOFOL ;  Surgeon: Lauren Daily, MD;  Location: [facility] ENDOSCOPY;  Service: Gastroenterology;  Laterality: N/A;   ESOPHAGOGASTRODUODENOSCOPY N/A 06/18/2017   Procedure: ESOPHAGOGASTRODUODENOSCOPY (EGD);  Surgeon: Lauren Daily, MD;  Location: [facility] ENDOSCOPY;  Service: Gastroenterology;  Laterality: N/A;   ESOPHAGOGASTRODUODENOSCOPY (EGD) WITH PROPOFOL  N/A 05/31/2019   Procedure: ESOPHAGOGASTRODUODENOSCOPY (EGD) WITH PROPOFOL ;  Surgeon: Lauren Daily, MD;  Location: [facility]  ENDOSCOPY;  Service: Gastroenterology;  Laterality: N/A;   REPLACEMENT TOTAL KNEE Left    SHOULDER SURGERY Left    x 2     reports that she quit smoking about 3 years ago. Her smoking use included cigarettes. She started smoking about 32 years ago. She has a 79.8 pack-year smoking history. She has never used smokeless tobacco. She reports that she does not drink alcohol and does not use drugs.  Allergies  Allergen Reactions   Aspirin      Able to tolerate low dose   Pregabalin Other (See Comments)     Unsteady gate, causing falls   Trazodone Other (See Comments)    weakness   Tramadol Rash    Family History  Problem Relation Age of Onset   Cancer Father        prostate   Stroke Father    Diabetes Father    Cancer Mother        bladder   Breast cancer Maternal Grandmother      Prior to Admission medications   Medication Sig Start Date End Date Taking? Authorizing Provider  albuterol  (PROVENTIL ) (2.5 MG/3ML) 0.083% nebulizer solution Take 3 mLs (2.5 mg total) by nebulization every 6 (six) hours as needed for wheezing or shortness of breath. 07/04/23   Lauren Glasgow, MD  albuterol  (VENTOLIN  HFA) 108 (90 Base) MCG/ACT inhaler Inhale into the lungs. 11/03/15   [provider]  ARIPiprazole  (ABILIFY ) 2 MG tablet Take 2 mg by mouth Escobar. 07/24/23   [provider]  aspirin  EC 81 MG tablet Take 81 mg by mouth Escobar. 05/21/17 05/21/26  [provider]  atorvastatin  (LIPITOR) 20 MG tablet Take 20 mg by mouth at bedtime. 04/21/19   [provider]  buPROPion  (WELLBUTRIN  XL) 150 MG 24 hr tablet Take 150 mg by mouth every morning. 12/30/23 12/29/24  [provider]  cetirizine (ZYRTEC) 10 MG tablet Take 10 mg by mouth Escobar. 04/27/19   [provider]  cyclobenzaprine  (FLEXERIL ) 10 MG tablet Take 5-10 mg by mouth every evening. 12/16/23   [provider]  escitalopram  (LEXAPRO ) 10 MG tablet Take 10 mg by mouth Escobar. 12/25/23   [provider]  esomeprazole (NEXIUM) 40 MG capsule Take 40 mg by mouth Escobar.  08/07/14   [provider]  Fluticasone -Umeclidin-Vilant (TRELEGY ELLIPTA ) 200-62.5-25 MCG/ACT AEPB Inhale 1 puff into the lungs Escobar. 04/08/24   Lauren Glasgow, MD  folic acid  (FOLVITE ) 1 MG tablet Take 1 mg by mouth Escobar. 05/01/19   [provider]  hydroxyurea (HYDREA) 500 MG capsule Take 500 mg by mouth 2 (two) times Escobar. May take with food to minimize GI side effects.    [provider]  isosorbide   mononitrate (IMDUR ) 30 MG 24 hr tablet Take 30 mg by mouth Escobar. 05/01/19   [provider]  mirtazapine  (REMERON ) 15 MG tablet Take 15 mg by mouth at bedtime. 11/05/23   [provider]  Multiple Vitamins-Minerals (MULTIVIT/MULTIMINERAL ADULT PO) Take 1 tablet by mouth Escobar.    [provider]  NITROGLYCERIN  SL Place 1 tablet under the tongue.    [provider]  predniSONE  (DELTASONE ) 50 MG tablet Take 1 tablet (50 mg total) by mouth Escobar with breakfast. 04/09/24   Bryson Carbine, MD  propranolol  (INDERAL ) 40 MG tablet Take 40 mg by mouth 2 (two) times Escobar. 05/24/19   [provider]    Physical Exam: Vitals:   04/12/24 0830 04/12/24 0900 04/12/24 0923 04/12/24 0930  BP:  91/65 (!) 87/61 (!) 86/60 (!) 85/63  Pulse: 75 75 77 79  Resp:   (!) 26 (!) 27  Temp:      SpO2: 95% 96% 96% 94%  Weight:      Height:        Constitutional: NAD, calm, comfortable Vitals:   04/12/24 0830 04/12/24 0900 04/12/24 0923 04/12/24 0930  BP: 91/65 (!) 87/61 (!) 86/60 (!) 85/63  Pulse: 75 75 77 79  Resp:   (!) 26 (!) 27  Temp:      SpO2: 95% 96% 96% 94%  Weight:      Height:       Eyes: PERRL, lids and conjunctivae normal ENMT: Mucous membranes are moist. Posterior pharynx clear of any exudate or lesions.Normal dentition.  Neck: normal, supple, no masses, no thyromegaly Respiratory: Diminished breathing sound bilaterally, scattered wheezing, no crackles, increasing respiratory effort. No accessory muscle use.  Cardiovascular: Regular rate and rhythm, no murmurs / rubs / gallops. No extremity edema. 2+ pedal pulses. No carotid bruits.  Abdomen: no tenderness, no masses palpated. No hepatosplenomegaly. Bowel sounds positive.  Musculoskeletal: no clubbing / cyanosis. No joint deformity upper and lower extremities. Good ROM, no contractures. Normal muscle tone.  Skin: no rashes, lesions, ulcers. No induration Neurologic: CN 2-12 grossly intact. Sensation  intact, DTR normal. Strength 5/5 in all 4.  Psychiatric: Normal judgment and insight. Alert and oriented x 3. Normal mood.     Labs on Admission: I have personally reviewed following labs and imaging studies  CBC: Recent Labs  Lab 04/09/24 1130 04/12/24 0724  WBC 5.1 12.6*  HGB 13.2 13.5  HCT 39.0 39.4  MCV 101.0* 102.6*  PLT 260 289   Basic Metabolic Panel: Recent Labs  Lab 04/09/24 1130 04/12/24 0724  NA 138 139  K 4.0 3.8  CL 106 108  CO2 24 22  GLUCOSE 119* 122*  BUN 14 15  CREATININE 0.52 0.72  CALCIUM  9.0 8.5*   GFR: Estimated Creatinine Clearance: 71.5 mL/min (by C-G formula based on SCr of 0.72 mg/dL). Liver Function Tests: Recent Labs  Lab 04/09/24 1130  AST 58*  ALT 39  ALKPHOS 60  BILITOT 1.0  PROT 6.5  ALBUMIN 3.9   No results for input(s): LIPASE, AMYLASE in the last 168 hours. No results for input(s): AMMONIA in the last 168 hours. Coagulation Profile: No results for input(s): INR, PROTIME in the last 168 hours. Cardiac Enzymes: No results for input(s): CKTOTAL, CKMB, CKMBINDEX, TROPONINI in the last 168 hours. BNP (last 3 results) No results for input(s): PROBNP in the last 8760 hours. HbA1C: No results for input(s): HGBA1C in the last 72 hours. CBG: No results for input(s): GLUCAP in the last 168 hours. Lipid Profile: No results for input(s): CHOL, HDL, LDLCALC, TRIG, CHOLHDL, LDLDIRECT in the last 72 hours. Thyroid Function Tests: No results for input(s): TSH, T4TOTAL, FREET4, T3FREE, THYROIDAB in the last 72 hours. Anemia Panel: No results for input(s): VITAMINB12, FOLATE, FERRITIN, TIBC, IRON, RETICCTPCT in the last 72 hours. Urine analysis:    Component Value Date/Time   COLORURINE YELLOW (A) 03/11/2021 1758   APPEARANCEUR CLOUDY (A) 03/11/2021 1758   APPEARANCEUR Hazy 06/10/2014 1142   LABSPEC 1.028 03/11/2021 1758   LABSPEC 1.018 06/10/2014 1142   PHURINE 5.0  03/11/2021 1758   GLUCOSEU NEGATIVE 03/11/2021 1758   GLUCOSEU Negative 06/10/2014 1142   HGBUR LARGE (A) 03/11/2021 1758   BILIRUBINUR NEGATIVE 03/11/2021 1758   BILIRUBINUR Negative 06/10/2014 1142   KETONESUR NEGATIVE  03/11/2021 1758   PROTEINUR 100 (A) 03/11/2021 1758   NITRITE NEGATIVE 03/11/2021 1758   LEUKOCYTESUR LARGE (A) 03/11/2021 1758   LEUKOCYTESUR 1+ 06/10/2014 1142    Radiological Exams on Admission: DG Chest 2 View Result Date: 04/12/2024 CLINICAL DATA:  Chest pain EXAM: CHEST - 2 VIEW COMPARISON:  04/09/2024 FINDINGS: The lungs are clear without focal pneumonia, edema, pneumothorax or pleural effusion. Interstitial markings are diffusely coarsened with chronic features. The cardiopericardial silhouette is within normal limits for size. No acute bony abnormality. IMPRESSION: Chronic interstitial coarsening without acute cardiopulmonary findings. Electronically Signed   By: Donnal Fusi M.D.   On: 04/12/2024 07:51    EKG: Independently reviewed.  Sinus rhythm, no acute ST changes.  Assessment/Plan Principal Problem:   COPD exacerbation (HCC) Active Problems:   COPD with acute exacerbation (HCC)  (please populate well all problems here in Problem List. (For example, if patient is on BP meds at home and you resume or decide to hold them, it is a problem that needs to be her. Same for CAD, COPD, HLD and so on)  Acute COPD exacerbation, failed outpatient management - Continue IV Solu-Medrol  - Continue ICS and LABA - DuoNebs and as needed albuterol  - Incentive spirometry - Check respiratory panel  HTN - BP borderline low - Hold off propranolol  and Imdur   GERD - Stable, continue PPI  Anxiety/depression - Mentation at baseline, continue SNRI   DVT prophylaxis: Lovenox  Code Status: Full code Family Communication: Husband at bedside Disposition Plan: Expect less than 2 midnight hospital stay Consults called: None Admission status: Telemetry  observation   [place] MD [facility] Pager [phone]  04/12/2024, 10:08 AM

## 2024-04-13 ENCOUNTER — Encounter: Payer: Self-pay | Admitting: Internal Medicine

## 2024-04-13 ENCOUNTER — Other Ambulatory Visit: Payer: Self-pay

## 2024-04-13 DIAGNOSIS — J441 Chronic obstructive pulmonary disease with (acute) exacerbation: Secondary | ICD-10-CM | POA: Diagnosis not present

## 2024-04-13 DIAGNOSIS — B9781 Human metapneumovirus as the cause of diseases classified elsewhere: Secondary | ICD-10-CM | POA: Insufficient documentation

## 2024-04-13 DIAGNOSIS — J208 Acute bronchitis due to other specified organisms: Secondary | ICD-10-CM

## 2024-04-13 LAB — CBC
HCT: 35.9 % — ABNORMAL LOW (ref 36.0–46.0)
Hemoglobin: 12.3 g/dL (ref 12.0–15.0)
MCH: 34.9 pg — ABNORMAL HIGH (ref 26.0–34.0)
MCHC: 34.3 g/dL (ref 30.0–36.0)
MCV: 102 fL — ABNORMAL HIGH (ref 80.0–100.0)
Platelets: 252 10*3/uL (ref 150–400)
RBC: 3.52 MIL/uL — ABNORMAL LOW (ref 3.87–5.11)
RDW: 18.1 % — ABNORMAL HIGH (ref 11.5–15.5)
WBC: 7.5 10*3/uL (ref 4.0–10.5)
nRBC: 0 % (ref 0.0–0.2)

## 2024-04-13 LAB — BASIC METABOLIC PANEL WITH GFR
Anion gap: 6 (ref 5–15)
BUN: 14 mg/dL (ref 6–20)
CO2: 26 mmol/L (ref 22–32)
Calcium: 8.5 mg/dL — ABNORMAL LOW (ref 8.9–10.3)
Chloride: 108 mmol/L (ref 98–111)
Creatinine, Ser: 0.55 mg/dL (ref 0.44–1.00)
GFR, Estimated: >60 mL/min (ref >60)
Glucose, Bld: 129 mg/dL — ABNORMAL HIGH (ref 70–99)
Potassium: 3.8 mmol/L (ref 3.5–5.1)
Sodium: 140 mmol/L (ref 135–145)

## 2024-04-13 MED ORDER — PREDNISONE 20 MG PO TABS
ORAL_TABLET | ORAL | 0 refills | Status: AC
  Filled 2024-04-13: qty 11, 9d supply, fill #0

## 2024-04-13 MED ORDER — BENZONATATE 100 MG PO CAPS
100.0000 mg | ORAL_CAPSULE | Freq: Three times a day (TID) | ORAL | 0 refills | Status: DC | PRN
  Filled 2024-04-13: qty 20, 7d supply, fill #0

## 2024-04-13 MED ORDER — GUAIFENESIN ER 600 MG PO TB12
1200.0000 mg | ORAL_TABLET | Freq: Two times a day (BID) | ORAL | 0 refills | Status: AC
  Filled 2024-04-13: qty 28, 7d supply, fill #0

## 2024-04-13 MED ORDER — IPRATROPIUM-ALBUTEROL 0.5-2.5 (3) MG/3ML IN SOLN
3.0000 mL | Freq: Four times a day (QID) | RESPIRATORY_TRACT | 0 refills | Status: AC
  Filled 2024-04-13: qty 90, 8d supply, fill #0

## 2024-04-13 NOTE — ED Notes (Signed)
Informed RN bed assigned 

## 2024-04-13 NOTE — TOC Transition Note (Signed)
 Transition of Care [facility]) - Discharge Note   Patient Details  Name: Lauren Escobar MRN: [id number] Date of Birth: September 20, 1966  [facility] CM/SW Contact:  Alexandra Ice, RN Phone Number: 04/13/2024, 10:56 AM   Clinical Narrative:     Patient to discharge today, home. No TOC needs identified at this time.    Barriers to Discharge: Barriers Resolved   Patient Goals and CMS Choice        Expected Discharge Plan and Services           Expected Discharge Date: 04/13/24                 DME Agency: NA       HH Arranged: NA          Prior Living Arrangements/Services                       Activities of Daily Living   ADL Screening (condition at time of admission) Independently performs ADLs?: Yes (appropriate for developmental age) Is the patient deaf or have difficulty hearing?: No Does the patient have difficulty seeing, even when wearing glasses/contacts?: No Does the patient have difficulty concentrating, remembering, or making decisions?: No  Permission Sought/Granted                  Emotional Assessment              Admission diagnosis:  COPD exacerbation (HCC) [J44.1] Acute cough [R05.1] Patient Active Problem List   Diagnosis Date Noted   Acute bronchitis due to human metapneumovirus 04/13/2024   COPD exacerbation (HCC) 04/12/2024   Acute respiratory failure with hypoxia (HCC) 01/07/2024   Bipolar disorder, unspecified (HCC) 10/17/2022   Influenza A 10/15/2022   COPD with acute exacerbation (HCC) 10/15/2022   Abnormal LFTs 10/15/2022   HTN (hypertension) 10/15/2022   Depression with anxiety 10/15/2022   Obesity with body mass index (BMI) of 30.0 to 39.9 10/15/2022   Tobacco abuse 10/15/2022   CAD (coronary artery disease) 10/15/2022   Fracture of phalanx of finger 05/21/2019   Impingement syndrome of right shoulder region 08/28/2017   Pain in joint, shoulder region (secondary) (right greater than  left) 04/16/2017   Knee pain (primary) (greater than right) 04/16/2017   Pain due to interstitial cystitis (tertiary) 04/16/2017   COPD (chronic obstructive pulmonary disease) (HCC) 04/15/2017   Depression 04/15/2017   GERD (gastroesophageal reflux disease) 04/15/2017   Migraines 04/15/2017   Chronic pain syndrome 04/15/2017   Long term current use of opiate analgesic 04/15/2017   Long term prescription opiate use 04/15/2017   Opiate use 04/15/2017   Urinary retention 02/05/2017   Bladder outlet obstruction 01/21/2017   Early satiety 12/19/2016   Epigastric pain 11/21/2016   Diarrhea, unspecified 11/21/2016   Dysuria 11/21/2016   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   Urinary tract infection 09/27/2016   Chest pain, unspecified 07/31/2016   S/P cardiac cath 07/31/2016   Unstable angina (HCC) 07/12/2016   Balance problem 12/27/2015   History of total knee arthroplasty 10/11/2015   Chronic hip pain (tertiary) (bilateral) (left greater than right) 08/09/2015   Abdominal pain, acute, right lower quadrant 05/18/2015   Polycythemia vera (HCC) 04/20/2015   Pain medication agreement signed 04/20/2015   Candidiasis of female genitalia 04/05/2015   Benign essential hypertension 03/31/2015   Mechanical breakdown of implanted electronic neurostimulator of peripheral nerve (HCC) 01/28/2015   Mixed hyperlipidemia 08/29/2014   Asthma without  status asthmaticus 07/28/2014   Detrusor instability 07/28/2014   Chest pain 07/15/2014   Chronic cough 07/15/2014   Chronic daily headache 07/15/2014   Congenital nystagmus 07/15/2014   Headache disorder 07/15/2014   Nystagmus, congenital 07/15/2014   Type I diabetes mellitus (HCC) 05/23/2014   Diabetic peripheral neuropathy (HCC) 06/25/2013   Chronic cystitis 03/25/2013   Atony of bladder 02/18/2013   Urinary incontinence without sensory awareness 02/18/2013   Female stress incontinence 01/20/2013   Functional disorder of  bladder 01/20/2013   Urge incontinence 01/20/2013   Overactive detrusor 01/20/2013   Chronic interstitial cystitis 07/02/2012   Dyspareunia 07/02/2012   Gross hematuria 07/02/2012   Incomplete emptying of bladder 07/02/2012   Mixed urge and stress incontinence 07/02/2012   Increased frequency of urination 07/02/2012   Von Willebrand's disease (HCC) 07/02/2012   PCP:  Dionicia Frater, MD Pharmacy:   [id number] - [place], [place] - [address] [place] [address] Phone: [phone] Fax: [phone]  [facility] [address] [place] [address] Phone: [phone] Fax: [phone]     Social Determinants of Health (SDOH) Interventions    Readmission Risk Interventions     No data to display           Final next level of care: Home/Self Care Barriers to Discharge: Barriers Resolved   Patient Goals and CMS Choice            Discharge Placement                  Name of family member notified: Buel Carls Patient and family notified of of transfer: 04/13/24  Discharge Plan and Services Additional resources added to the After Visit Summary for                    DME Agency: NA       HH Arranged: NA          Social Drivers of Health (SDOH) Interventions SDOH Screenings   Food Insecurity: No Food Insecurity (04/13/2024)  Housing: High Risk (04/13/2024)  Transportation Needs: No Transportation Needs (04/13/2024)  Utilities: Not At Risk (04/13/2024)  Depression (PHQ2-9): High Risk (12/30/2022)  Social Connections: Moderately Isolated (04/13/2024)  Tobacco Use: Medium Risk (04/13/2024)     Readmission Risk Interventions     No data to display

## 2024-04-13 NOTE — Discharge Summary (Signed)
 Physician Discharge Summary   Patient: Lauren Escobar MRN: [id number] DOB: 20-Dec-1965  Admit date:     04/12/2024  Discharge date: 04/13/24  Discharge Physician: Lauren Escobar   PCP: Lauren Escobar   Recommendations at discharge:   Follow-up with PCP in 1 week.  Discharge Diagnoses: Principal Problem:   COPD exacerbation (HCC) Active Problems:   COPD with acute exacerbation (HCC)   CAD (coronary artery disease)   Diabetic peripheral neuropathy (HCC)   Bipolar disorder, unspecified (HCC)   Acute bronchitis due to human metapneumovirus  Resolved Problems:   * No resolved hospital problems. *  Hospital Course: Lauren Escobar is a 58 y.o. female with medical history significant of COPD Gold stage II, HTN, HLD, GERD, polycythemia vera, presented with worsening of cough wheezing shortness of breath.  She has not been requiring any oxygen.  Sputum study positive for human metapneumovirus, no evidence of bacterial pneumonia.  Patient has been treated with steroids and a DuoNeb.  She still have cough spells, short of breath has improved.  At this point, she is medically stable for discharge.  Assessment and Plan: COPD exacerbation secondary to metapneumovirus viral bronchitis. No bacterial infection, no hypoxemia. Short of breath has improved, but still have cough spells.  Will continue DuoNeb at home, steroid taper.  Patient be followed by PCP as outpatient.  Essential hypertension. Restart home medicines.  Class I obesity with BMI 32.64. Diet and excise advised.      Consultants: None Procedures performed: None  Disposition: Home Diet recommendation:  Discharge Diet Orders (From admission, onward)     Start     Ordered   04/13/24 0000  Diet - low sodium heart healthy        04/13/24 1019           Cardiac diet DISCHARGE MEDICATION: Allergies as of 04/13/2024       Reactions   Aspirin     Able to tolerate low dose   Pregabalin Other (See Comments)    Unsteady gate, causing falls   Trazodone Other (See Comments)   weakness   Tramadol Rash        Medication List     STOP taking these medications    buPROPion  150 MG 24 hr tablet Commonly known as: WELLBUTRIN  XL   cyclobenzaprine  10 MG tablet Commonly known as: FLEXERIL    escitalopram  10 MG tablet Commonly known as: LEXAPRO    NITROGLYCERIN  SL   sertraline 25 MG tablet Commonly known as: ZOLOFT       TAKE these medications    albuterol  (2.5 MG/3ML) 0.083% nebulizer solution Commonly known as: PROVENTIL  Take 3 mLs (2.5 mg total) by nebulization every 6 (six) hours as needed for wheezing or shortness of breath. What changed: Another medication with the same name was removed. Continue taking this medication, and follow the directions you see here.   ARIPiprazole  2 MG tablet Commonly known as: ABILIFY  Take 2 mg by mouth daily.   aspirin  EC 81 MG tablet Take 81 mg by mouth daily.   atorvastatin  20 MG tablet Commonly known as: LIPITOR Take 20 mg by mouth at bedtime.   benzonatate 100 MG capsule Commonly known as: TESSALON Take 1 capsule (100 mg total) by mouth 3 (three) times daily as needed for cough.   busPIRone  7.5 MG tablet Commonly known as: BUSPAR  Take 7.5 mg by mouth 3 (three) times daily.   cetirizine 10 MG tablet Commonly known as: ZYRTEC Take 10 mg by mouth daily.  folic acid  1 MG tablet Commonly known as: FOLVITE  Take 1 mg by mouth daily.   guaiFENesin  600 MG 12 hr tablet Commonly known as: MUCINEX  Take 2 tablets (1,200 mg total) by mouth 2 (two) times daily for 7 days.   hydroxyurea 500 MG capsule Commonly known as: HYDREA Take 500 mg by mouth 2 (two) times daily. May take with food to minimize GI side effects.   ipratropium-albuterol  0.5-2.5 (3) MG/3ML Soln Commonly known as: DUONEB Take 3 mLs by nebulization every 6 (six) hours for 7 days.   isosorbide  mononitrate 30 MG 24 hr tablet Commonly known as: IMDUR  Take 30 mg by mouth  daily.   mirtazapine  15 MG tablet Commonly known as: REMERON  Take 15 mg by mouth at bedtime.   MULTIVIT/MULTIMINERAL ADULT PO Take 1 tablet by mouth daily.   NexIUM 40 MG capsule Generic drug: esomeprazole Take 40 mg by mouth daily.   predniSONE  20 MG tablet Commonly known as: DELTASONE  Take 2 tablets (40 mg total) by mouth daily with breakfast for 3 days, THEN 1 tablet (20 mg total) daily with breakfast for 3 days, THEN 0.5 tablets (10 mg total) daily with breakfast for 3 days. Start taking on: April 14, 2024 What changed:  medication strength See the new instructions.   propranolol  40 MG tablet Commonly known as: INDERAL  Take 40 mg by mouth 2 (two) times daily.   Trelegy Ellipta  200-62.5-25 MCG/ACT Aepb Generic drug: Fluticasone -Umeclidin-Vilant Inhale 1 puff into the lungs daily.               Durable Medical Equipment  (From admission, onward)           Start     Ordered   04/13/24 1016  For home use only DME Nebulizer machine  Once       Question Answer Comment  Patient needs a nebulizer to treat with the following condition COPD exacerbation (HCC)   Length of Need 6 Months   Additional equipment included Administration kit      04/13/24 1015            Follow-up Information     Bender, Lurlean Salmons, Escobar Follow up in 1 week(s).   Specialty: Family Medicine Contact information: [address] [place] [address] [phone]                Discharge Exam: Filed Weights   04/12/24 0715 04/13/24 0816  Weight: 82.9 kg 73.3 kg   General exam: Appears calm and comfortable  Respiratory system: Decreased breath sounds without wheezes.Aaron Aas Respiratory effort normal. Cardiovascular system: S1 & S2 heard, RRR. No JVD, murmurs, rubs, gallops or clicks. No pedal edema. Gastrointestinal system: Abdomen is nondistended, soft and nontender. No organomegaly or masses felt. Normal bowel sounds heard. Central nervous system: Alert and  oriented. No focal neurological deficits. Extremities: Symmetric 5 x 5 power. Skin: No rashes, lesions or ulcers Psychiatry: Judgement and insight appear normal. Mood & affect appropriate.    Condition at discharge: good  The results of significant diagnostics from this hospitalization (including imaging, microbiology, ancillary and laboratory) are listed below for reference.   Imaging Studies: DG Chest 2 View Result Date: 04/12/2024 CLINICAL DATA:  Chest pain EXAM: CHEST - 2 VIEW COMPARISON:  04/09/2024 FINDINGS: The lungs are clear without focal pneumonia, edema, pneumothorax or pleural effusion. Interstitial markings are diffusely coarsened with chronic features. The cardiopericardial silhouette is within normal limits for size. No acute bony abnormality. IMPRESSION: Chronic interstitial coarsening without acute  cardiopulmonary findings. Electronically Signed   By: Donnal Fusi M.D.   On: 04/12/2024 07:51   DG Chest Port 1 View Result Date: 04/09/2024 CLINICAL DATA:  Shortness of breath and chest pain. EXAM: PORTABLE CHEST 1 VIEW COMPARISON:  01/07/2024. FINDINGS: The heart size and mediastinal contours are within normal limits. No focal consolidation, pleural effusion, or pneumothorax. No acute osseous abnormality. IMPRESSION: No acute cardiopulmonary findings. Electronically Signed   By: Mannie Seek M.D.   On: 04/09/2024 12:07    Microbiology: Results for orders placed or performed during the hospital encounter of 04/12/24  Resp panel by RT-PCR (RSV, Flu A&B, Covid) Anterior Nasal Swab     Status: None   Collection Time: 04/12/24  8:18 AM   Specimen: Anterior Nasal Swab  Result Value Ref Range Status   SARS Coronavirus 2 by RT PCR NEGATIVE NEGATIVE Final    Comment: (NOTE) SARS-CoV-2 target nucleic acids are NOT DETECTED.  The SARS-CoV-2 RNA is generally detectable in upper respiratory specimens during the acute phase of infection. The lowest concentration of SARS-CoV-2 viral  copies this assay can detect is 138 copies/mL. A negative result does not preclude SARS-Cov-2 infection and should not be used as the sole basis for treatment or other patient management decisions. A negative result may occur with  improper specimen collection/handling, submission of specimen other than nasopharyngeal swab, presence of viral mutation(s) within the areas targeted by this assay, and inadequate number of viral copies(<138 copies/mL). A negative result must be combined with clinical observations, patient history, and epidemiological information. The expected result is Negative.  Fact Sheet for Patients:  BloggerCourse.com  Fact Sheet for Healthcare Providers:  SeriousBroker.it  This test is no t yet approved or cleared by the [place]  FDA and  has been authorized for detection and/or diagnosis of SARS-CoV-2 by FDA under an Emergency Use Authorization (EUA). This EUA will remain  in effect (meaning this test can be used) for the duration of the COVID-19 declaration under Section 564(b)(1) of the Act, 21 U.S.C.section 360bbb-3(b)(1), unless the authorization is terminated  or revoked sooner.       Influenza A by PCR NEGATIVE NEGATIVE Final   Influenza B by PCR NEGATIVE NEGATIVE Final    Comment: (NOTE) The Xpert Xpress SARS-CoV-2/FLU/RSV plus assay is intended as an aid in the diagnosis of influenza from Nasopharyngeal swab specimens and should not be used as a sole basis for treatment. Nasal washings and aspirates are unacceptable for Xpert Xpress SARS-CoV-2/FLU/RSV testing.  Fact Sheet for Patients: BloggerCourse.com  Fact Sheet for Healthcare Providers: SeriousBroker.it  This test is not yet approved or cleared by the [place]  FDA and has been authorized for detection and/or diagnosis of SARS-CoV-2 by FDA under an Emergency Use Authorization (EUA). This  EUA will remain in effect (meaning this test can be used) for the duration of the COVID-19 declaration under Section 564(b)(1) of the Act, 21 U.S.C. section 360bbb-3(b)(1), unless the authorization is terminated or revoked.     Resp Syncytial Virus by PCR NEGATIVE NEGATIVE Final    Comment: (NOTE) Fact Sheet for Patients: BloggerCourse.com  Fact Sheet for Healthcare Providers: SeriousBroker.it  This test is not yet approved or cleared by the [place]  FDA and has been authorized for detection and/or diagnosis of SARS-CoV-2 by FDA under an Emergency Use Authorization (EUA). This EUA will remain in effect (meaning this test can be used) for the duration of the COVID-19 declaration under Section 564(b)(1) of the Act, 21 U.S.C. section 360bbb-3(b)(1), unless  the authorization is terminated or revoked.  Performed at [facility], [address] [place], [place] [address]   Respiratory (~20 pathogens) panel by PCR     Status: Abnormal   Collection Time: 04/12/24 11:35 AM   Specimen: Nasopharyngeal Swab; Respiratory  Result Value Ref Range Status   Adenovirus NOT DETECTED NOT DETECTED Final   Coronavirus 229E NOT DETECTED NOT DETECTED Final    Comment: (NOTE) The Coronavirus on the Respiratory Panel, DOES NOT test for the novel  Coronavirus (2019 nCoV)    Coronavirus HKU1 NOT DETECTED NOT DETECTED Final   Coronavirus NL63 NOT DETECTED NOT DETECTED Final   Coronavirus OC43 NOT DETECTED NOT DETECTED Final   Metapneumovirus DETECTED (A) NOT DETECTED Final   Rhinovirus / Enterovirus NOT DETECTED NOT DETECTED Final   Influenza A NOT DETECTED NOT DETECTED Final   Influenza B NOT DETECTED NOT DETECTED Final   Parainfluenza Virus 1 NOT DETECTED NOT DETECTED Final   Parainfluenza Virus 2 NOT DETECTED NOT DETECTED Final   Parainfluenza Virus 3 NOT DETECTED NOT DETECTED Final   Parainfluenza Virus 4 NOT DETECTED NOT DETECTED  Final   Respiratory Syncytial Virus NOT DETECTED NOT DETECTED Final   Bordetella pertussis NOT DETECTED NOT DETECTED Final   Bordetella Parapertussis NOT DETECTED NOT DETECTED Final   Chlamydophila pneumoniae NOT DETECTED NOT DETECTED Final   Mycoplasma pneumoniae NOT DETECTED NOT DETECTED Final    Comment: Performed at [facility], [address], [place], [place] [address]    Labs: CBC: Recent Labs  Lab 04/09/24 1130 04/12/24 0724 04/13/24 0520  WBC 5.1 12.6* 7.5  HGB 13.2 13.5 12.3  HCT 39.0 39.4 35.9*  MCV 101.0* 102.6* 102.0*  PLT 260 289 252   Basic Metabolic Panel: Recent Labs  Lab 04/09/24 1130 04/12/24 0724 04/13/24 0520  NA 138 139 140  K 4.0 3.8 3.8  CL 106 108 108  CO2 24 22 26   GLUCOSE 119* 122* 129*  BUN 14 15 14   CREATININE 0.52 0.72 0.55  CALCIUM  9.0 8.5* 8.5*   Liver Function Tests: Recent Labs  Lab 04/09/24 1130  AST 58*  ALT 39  ALKPHOS 60  BILITOT 1.0  PROT 6.5  ALBUMIN 3.9   CBG: No results for input(s): GLUCAP in the last 168 hours.  Discharge time spent: greater than 30 minutes.  Signed: Donaciano Frizzle, Escobar [facility] 04/13/2024

## 2024-04-13 NOTE — Care Management Obs Status (Signed)
 MEDICARE OBSERVATION STATUS NOTIFICATION   Patient Details  Name: Lauren Escobar MRN: [id number] Date of Birth: 1966/01/02   Medicare Observation Status Notification Given:  Rudolph Cost, CMA 04/13/2024, 10:59 AM

## 2024-04-27 ENCOUNTER — Encounter: Attending: Student in an Organized Health Care Education/Training Program | Admitting: *Deleted

## 2024-04-27 DIAGNOSIS — J432 Centrilobular emphysema: Secondary | ICD-10-CM | POA: Insufficient documentation

## 2024-04-27 NOTE — Progress Notes (Signed)
 Initial phone call completed. Diagnosis can be found in [facility] 6/12. EP Orientation scheduled for Wednesday 7/2 at 1:30.

## 2024-04-28 ENCOUNTER — Encounter

## 2024-04-28 VITALS — Ht 61.5 in | Wt 165.8 lb

## 2024-04-28 DIAGNOSIS — J432 Centrilobular emphysema: Secondary | ICD-10-CM

## 2024-04-28 NOTE — Progress Notes (Signed)
 Assessment start time: 2:31 PM  Digestive issues/concerns: no known food allergies   24-hours Recall: B: nothing L: nothing D: pork chops, mac and cheese  Beverages coffee with french vanilla creamer, and mt dew 16oz  Education r/t nutrition plan Patient doesn't drink much water, mostly just met dew, about 16oz daily. She doesn't eat breakfast or lunch. Usually just dinner. Spoke with her about the importance of eating more throughout the day. She was not sure she would eat more than she already was. Suggested a protein shake as a good starting point. Breaking it into two 5.5oz halves if needed. Suggested pairing it with a carb, like fruit or crackers. Provided mediterranean diet handout. Kept goals simple to eat more and drink more water.     Goal 1: Eat 3 times per day, small frequent meals or nutrient dense snacks (use protein shake if needed) Goal 2: Eat 15-30gProtein and 30-60gCarbs at each meal. Goal 3: Drink 16-32oz of water daily  End time 3:01 PM

## 2024-04-28 NOTE — Patient Instructions (Signed)
 Patient Instructions  Patient Details  Name: Lauren Escobar MRN: [id number] Date of Birth: Jun 13, 1966 Referring Provider:  [name], MD  Below are your personal goals for exercise, nutrition, and risk factors. Our goal is to help you stay on track towards obtaining and maintaining these goals. We will be discussing your progress on these goals with you throughout the program.  Initial Exercise Prescription:  Initial Exercise Prescription - 04/28/24 1500       Date of Initial Exercise RX and Referring Provider   Date 04/28/24    Referring Provider [name] MD      Oxygen   Maintain Oxygen Saturation 88% or higher      Recumbant Bike   Level 2    RPM 50    Watts 25    Minutes 15    METs 3.13      NuStep   Level 2    SPM 80    Minutes 15    METs 3.13      REL-XR   Level 1    Speed 50    Minutes 15    METs 3.13      T5 Nustep   Level 2    SPM 80    Minutes 15    METs 3.13      Track   Laps 26    Minutes 15    METs 2.41      Prescription Details   Frequency (times per week) 2    Duration Progress to 30 minutes of continuous aerobic without signs/symptoms of physical distress      Intensity   THRR 40-80% of Max Heartrate 117-147    Ratings of Perceived Exertion 11-13    Perceived Dyspnea 0-4      Progression   Progression Continue to progress workloads to maintain intensity without signs/symptoms of physical distress.      Resistance Training   Training Prescription Yes    Weight 4 lb    Reps 10-15          Exercise Goals: Frequency: Be able to perform aerobic exercise two to three times per week in program working toward 2-5 days per week of home exercise.  Intensity: Work with a perceived exertion of 11 (fairly light) - 15 (hard) while following your exercise prescription.  We will make changes to your prescription with you as you progress through the program.   Duration: Be able to do 30 to 45 minutes of continuous aerobic  exercise in addition to a 5 minute warm-up and a 5 minute cool-down routine.   Nutrition Goals: Your personal nutrition goals will be established when you do your nutrition analysis with the dietician.  The following are general nutrition guidelines to follow: Cholesterol < 200mg /day Sodium < 1500mg /day Fiber: Women over 50 yrs - 21 grams per day  Personal Goals:  Personal Goals and Risk Factors at Admission - 04/28/24 1548       Core Components/Risk Factors/Patient Goals on Admission    Weight Management Yes;Weight Loss    Intervention Weight Management: Develop a combined nutrition and exercise program designed to reach desired caloric intake, while maintaining appropriate intake of nutrient and fiber, sodium and fats, and appropriate energy expenditure required for the weight goal.;Weight Management: Provide education and appropriate resources to help participant work on and attain dietary goals.;Weight Management/Obesity: Establish reasonable short term and long term weight goals.    Admit Weight 165 lb 12.8 oz (75.2 kg)    Goal Weight:  Short Term 147 lb 8 oz (66.9 kg)    Goal Weight: Long Term 130 lb (59 kg)    Expected Outcomes Short Term: Continue to assess and modify interventions until short term weight is achieved;Long Term: Adherence to nutrition and physical activity/exercise program aimed toward attainment of established weight goal;Weight Loss: Understanding of general recommendations for a balanced deficit meal plan, which promotes 1-2 lb weight loss per week and includes a negative energy balance of [phone] kcal/d;Understanding recommendations for meals to include 15-35% energy as protein, 25-35% energy from fat, 35-60% energy from carbohydrates, less than 200mg  of dietary cholesterol, 20-35 gm of total fiber daily;Understanding of distribution of calorie intake throughout the day with the consumption of 4-5 meals/snacks    Improve shortness of breath with ADL's Yes     Intervention Provide education, individualized exercise plan and daily activity instruction to help decrease symptoms of SOB with activities of daily living.    Expected Outcomes Short Term: Improve cardiorespiratory fitness to achieve a reduction of symptoms when performing ADLs;Long Term: Be able to perform more ADLs without symptoms or delay the onset of symptoms    Increase knowledge of respiratory medications and ability to use respiratory devices properly  Yes    Intervention Provide education and demonstration as needed of appropriate use of medications, inhalers, and oxygen therapy.    Expected Outcomes Short Term: Achieves understanding of medications use. Understands that oxygen is a medication prescribed by physician. Demonstrates appropriate use of inhaler and oxygen therapy.;Long Term: Maintain appropriate use of medications, inhalers, and oxygen therapy.    Hypertension Yes    Intervention Provide education on lifestyle modifcations including regular physical activity/exercise, weight management, moderate sodium restriction and increased consumption of fresh fruit, vegetables, and low fat dairy, alcohol moderation, and smoking cessation.;Monitor prescription use compliance.    Expected Outcomes Short Term: Continued assessment and intervention until BP is < 140/62mm HG in hypertensive participants. < 130/64mm HG in hypertensive participants with diabetes, heart failure or chronic kidney disease.;Long Term: Maintenance of blood pressure at goal levels.          Tobacco Use Initial Evaluation: Social History   Tobacco Use  Smoking Status Former   Current packs/day: 0.00   Average packs/day: 2.8 packs/day for 29.0 years (79.8 ttl pk-yrs)   Types: Cigarettes   Start date: 10/1991   Quit date: 10/2020   Years since quitting: 3.5  Smokeless Tobacco Never    Exercise Goals and Review:  Exercise Goals     Row Name 04/28/24 1545             Exercise Goals   Increase Physical  Activity Yes       Intervention Provide advice, education, support and counseling about physical activity/exercise needs.;Develop an individualized exercise prescription for aerobic and resistive training based on initial evaluation findings, risk stratification, comorbidities and participant's personal goals.       Expected Outcomes Long Term: Exercising regularly at least 3-5 days a week.;Long Term: Add in home exercise to make exercise part of routine and to increase amount of physical activity.;Short Term: Attend rehab on a regular basis to increase amount of physical activity.       Increase Strength and Stamina Yes       Intervention Provide advice, education, support and counseling about physical activity/exercise needs.;Develop an individualized exercise prescription for aerobic and resistive training based on initial evaluation findings, risk stratification, comorbidities and participant's personal goals.       Expected Outcomes Short  Term: Increase workloads from initial exercise prescription for resistance, speed, and METs.;Short Term: Perform resistance training exercises routinely during rehab and add in resistance training at home;Long Term: Improve cardiorespiratory fitness, muscular endurance and strength as measured by increased METs and functional capacity ( )       Able to understand and use rate of perceived exertion (RPE) scale Yes       Intervention Provide education and explanation on how to use RPE scale       Expected Outcomes Short Term: Able to use RPE daily in rehab to express subjective intensity level;Long Term:  Able to use RPE to guide intensity level when exercising independently       Able to understand and use Dyspnea scale Yes       Intervention Provide education and explanation on how to use Dyspnea scale       Expected Outcomes Short Term: Able to use Dyspnea scale daily in rehab to express subjective sense of shortness of breath during exertion;Long Term: Able to  use Dyspnea scale to guide intensity level when exercising independently       Knowledge and understanding of Target Heart Rate Range (THRR) Yes       Intervention Provide education and explanation of THRR including how the numbers were predicted and where they are located for reference       Expected Outcomes Short Term: Able to state/look up THRR;Short Term: Able to use daily as guideline for intensity in rehab;Long Term: Able to use THRR to govern intensity when exercising independently       Able to check pulse independently Yes       Intervention Provide education and demonstration on how to check pulse in carotid and radial arteries.;Review the importance of being able to check your own pulse for safety during independent exercise       Expected Outcomes Short Term: Able to explain why pulse checking is important during independent exercise;Long Term: Able to check pulse independently and accurately       Understanding of Exercise Prescription Yes       Intervention Provide education, explanation, and written materials on patient's individual exercise prescription       Expected Outcomes Short Term: Able to explain program exercise prescription;Long Term: Able to explain home exercise prescription to exercise independently

## 2024-04-28 NOTE — Progress Notes (Signed)
 Pulmonary Individual Treatment Plan  Patient Details  Name: Lauren Escobar MRN: [id number] Date of Birth: 1966/08/22 Referring Provider:   Flowsheet Row Pulmonary Rehab from 04/28/2024 in [facility] Cardiac and Pulmonary Rehab  Referring Provider [name] MD    Initial Encounter Date:  Flowsheet Row Pulmonary Rehab from 04/28/2024 in [facility] Cardiac and Pulmonary Rehab  Date 04/28/24    Visit Diagnosis: Centrilobular emphysema (HCC)  Patient's Home Medications on Admission:  Current Outpatient Medications:    albuterol  (PROVENTIL ) (2.5 MG/3ML) 0.083% nebulizer solution, Take 3 mLs (2.5 mg total) by nebulization every 6 (six) hours as needed for wheezing or shortness of breath. (Patient not taking: Reported on 04/12/2024), Disp: 75 mL, Rfl: 12   ARIPiprazole  (ABILIFY ) 2 MG tablet, Take 2 mg by mouth daily. (Patient not taking: Reported on 04/12/2024), Disp: , Rfl:    aspirin  EC 81 MG tablet, Take 81 mg by mouth daily., Disp: , Rfl:    atorvastatin  (LIPITOR) 20 MG tablet, Take 20 mg by mouth at bedtime., Disp: , Rfl:    benzonatate  (TESSALON ) 100 MG capsule, Take 1 capsule (100 mg total) by mouth 3 (three) times daily as needed for cough., Disp: 20 capsule, Rfl: 0   busPIRone  (BUSPAR ) 7.5 MG tablet, Take 7.5 mg by mouth 3 (three) times daily., Disp: , Rfl:    cetirizine (ZYRTEC) 10 MG tablet, Take 10 mg by mouth daily., Disp: , Rfl:    esomeprazole (NEXIUM) 40 MG capsule, Take 40 mg by mouth daily. , Disp: , Rfl:    Fluticasone -Umeclidin-Vilant (TRELEGY ELLIPTA ) 200-62.5-25 MCG/ACT AEPB, Inhale 1 puff into the lungs daily., Disp: 60 each, Rfl: 11   folic acid  (FOLVITE ) 1 MG tablet, Take 1 mg by mouth daily., Disp: , Rfl:    hydroxyurea  (HYDREA ) 500 MG capsule, Take 500 mg by mouth 2 (two) times daily. May take with food to minimize GI side effects., Disp: , Rfl:    ipratropium-albuterol  (DUONEB) 0.5-2.5 (3) MG/3ML SOLN, Take 3 mLs by nebulization every 6 (six) hours for 7 days., Disp: 90 mL,  Rfl: 0   isosorbide  mononitrate (IMDUR ) 30 MG 24 hr tablet, Take 30 mg by mouth daily., Disp: , Rfl:    mirtazapine  (REMERON ) 15 MG tablet, Take 15 mg by mouth at bedtime., Disp: , Rfl:    Multiple Vitamins-Minerals (MULTIVIT/MULTIMINERAL ADULT PO), Take 1 tablet by mouth daily., Disp: , Rfl:    propranolol  (INDERAL ) 40 MG tablet, Take 40 mg by mouth 2 (two) times daily., Disp: , Rfl:   Past Medical History: Past Medical History:  Diagnosis Date   Abdominal pain    Abnormal mammogram    Anxiety    Asthma    Bleeding disorder (HCC)    Blood dyscrasia    polycythemia vera   Chronic kidney disease    COPD (chronic obstructive pulmonary disease) (HCC)    Cough    Depression    Diabetes mellitus without complication (HCC)    Fatigue    Headache 2000   Horizontal nystagmus    age 58   Hyperglycemia    Hypertension    Low back pain    Major depressive disorder    Nasal lesion    Neck pain    Snoring    SOB (shortness of breath)    Tobacco use    UTI (urinary tract infection)    Von Willebrand's disease (HCC)     Tobacco Use: Social History   Tobacco Use  Smoking Status Former   Current packs/day:  0.00   Average packs/day: 2.8 packs/day for 29.0 years (79.8 ttl pk-yrs)   Types: Cigarettes   Start date: 10/1991   Quit date: 10/2020   Years since quitting: 3.5  Smokeless Tobacco Never    Labs: Review Flowsheet  More data exists      Latest Ref Rng & Units 01/01/2014 06/16/2017 10/15/2022 01/07/2024 04/12/2024  Labs for ITP Cardiac and Pulmonary Rehab  Cholestrol 0 - 200 mg/dL 890  - 824  - -  LDL (calc) 0 - 99 mg/dL 40  - 92  - -  HDL-C >59 mg/dL 34  - 49  - -  Trlycerides <150 mg/dL 824  - 827  - -  Hemoglobin A1c 4.8 - 5.6 % 5.5  5.5  6.1  - -  Bicarbonate 20.0 - 28.0 mmol/L - - 23.4  26.5  27.3   Acid-base deficit 0.0 - 2.0 mmol/L - - 0.7  - -  O2 Saturation % - - 84.1  58.3  38      Pulmonary Assessment Scores:  Pulmonary Assessment Scores     Row Name  04/28/24 1546         ADL UCSD   ADL Phase Entry     SOB Score total 78     Rest 3     Walk 4     Stairs 5     Bath 3     Dress 3     Shop 4       CAT Score   CAT Score 29       mMRC Score   mMRC Score 4        UCSD: Self-administered rating of dyspnea associated with activities of daily living (ADLs) 6-point scale (0 = not at all to 5 = maximal or unable to do because of breathlessness)  Scoring Scores range from 0 to 120.  Minimally important difference is 5 units  CAT: CAT can identify the health impairment of COPD patients and is better correlated with disease progression.  CAT has a scoring range of zero to 40. The CAT score is classified into four groups of low (less than 10), medium (10 - 20), high (21-30) and very high (31-40) based on the impact level of disease on health status. A CAT score over 10 suggests significant symptoms.  A worsening CAT score could be explained by an exacerbation, poor medication adherence, poor inhaler technique, or progression of COPD or comorbid conditions.  CAT MCID is 2 points  mMRC: mMRC (Modified Medical Research Council) Dyspnea Scale is used to assess the degree of baseline functional disability in patients of respiratory disease due to dyspnea. No minimal important difference is established. A decrease in score of 1 point or greater is considered a positive change.   Pulmonary Function Assessment:   Exercise Target Goals: Exercise Program Goal: Individual exercise prescription set using results from initial 6 min walk test and THRR while considering  patient's activity barriers and safety.   Exercise Prescription Goal: Initial exercise prescription builds to 30-45 minutes a day of aerobic activity, 2-3 days per week.  Home exercise guidelines will be given to patient during program as part of exercise prescription that the participant will acknowledge.  Education: Aerobic Exercise: - Group verbal and visual presentation  on the components of exercise prescription. Introduces F.I.T.T principle from ACSM for exercise prescriptions.  Reviews F.I.T.T. principles of aerobic exercise including progression. Written material given at graduation.   Education: Resistance Exercise: - Group verbal  and visual presentation on the components of exercise prescription. Introduces F.I.T.T principle from ACSM for exercise prescriptions  Reviews F.I.T.T. principles of resistance exercise including progression. Written material given at graduation. Flowsheet Row Pulmonary Rehab from 12/25/2022 in [facility] Cardiac and Pulmonary Rehab  Date 11/20/22  Educator [facility]  Instruction Review Code 1- Verbalizes Understanding     Education: Exercise & Equipment Safety: - Individual verbal instruction and demonstration of equipment use and safety with use of the equipment. Flowsheet Row Pulmonary Rehab from 04/28/2024 in [facility] Cardiac and Pulmonary Rehab  Date 04/28/24  Educator MB  Instruction Review Code 1- Verbalizes Understanding    Education: Exercise Physiology & General Exercise Guidelines: - Group verbal and written instruction with models to review the exercise physiology of the cardiovascular system and associated critical values. Provides general exercise guidelines with specific guidelines to those with heart or lung disease.    Education: Flexibility, Balance, Mind/Body Relaxation: - Group verbal and visual presentation with interactive activity on the components of exercise prescription. Introduces F.I.T.T principle from ACSM for exercise prescriptions. Reviews F.I.T.T. principles of flexibility and balance exercise training including progression. Also discusses the mind body connection.  Reviews various relaxation techniques to help reduce and manage stress (i.e. Deep breathing, progressive muscle relaxation, and visualization). Balance handout provided to take home. Written material given at graduation. Flowsheet Row Pulmonary Rehab  from 12/25/2022 in [facility] Cardiac and Pulmonary Rehab  Date 11/20/22  Educator [facility]  Instruction Review Code 1- Verbalizes Understanding    Activity Barriers & Risk Stratification:  Activity Barriers & Cardiac Risk Stratification - 04/28/24 1542       Activity Barriers & Cardiac Risk Stratification   Activity Barriers Left Knee Replacement;Back Problems;Shortness of Breath;Balance Concerns          6 Minute Walk:  6 Minute Walk     Row Name 04/28/24 1538         6 Minute Walk   Phase Initial     Distance 1170 feet     Walk Time 6 minutes     # of Rest Breaks 0     MPH 2.2     METS 3.13     RPE 19     Perceived Dyspnea  2     Symptoms Yes (comment)     Comments Chest pain 9/10 and resolved to a 7/10 at rest     Resting HR 93 bpm     Resting BP 100/70     Resting Oxygen Saturation  96 %     Exercise Oxygen Saturation  during 6 min walk 91 %     Max Ex. HR 117 bpm     Max Ex. BP 120/78     2 Minute Post BP 92/70       Interval HR   1 Minute HR 100     2 Minute HR 117     3 Minute HR 112     4 Minute HR 110     5 Minute HR 87     6 Minute HR 95     2 Minute Post HR 91     Interval Heart Rate? Yes       Interval Oxygen   Interval Oxygen? Yes     Baseline Oxygen Saturation % 96 %     1 Minute Oxygen Saturation % 91 %     1 Minute Liters of Oxygen 0 L     2 Minute Oxygen Saturation % 91 %  2 Minute Liters of Oxygen 0 L     3 Minute Oxygen Saturation % 94 %     3 Minute Liters of Oxygen 0 L     4 Minute Oxygen Saturation % 94 %     4 Minute Liters of Oxygen 0 L     5 Minute Oxygen Saturation % 94 %     5 Minute Liters of Oxygen 0 L     6 Minute Oxygen Saturation % 93 %     6 Minute Liters of Oxygen 0 L     2 Minute Post Oxygen Saturation % 98 %     2 Minute Post Liters of Oxygen 0 L       Oxygen Initial Assessment:  Oxygen Initial Assessment - 04/27/24 1304       Home Oxygen   Home Oxygen Device None    Sleep Oxygen Prescription None    Home  Exercise Oxygen Prescription None    Home Resting Oxygen Prescription None    Compliance with Home Oxygen Use Yes      Intervention   Short Term Goals To learn and understand importance of maintaining oxygen saturations>88%;To learn and understand importance of monitoring SPO2 with pulse oximeter and demonstrate accurate use of the pulse oximeter.;To learn and demonstrate proper pursed lip breathing techniques or other breathing techniques. ;To learn and demonstrate proper use of respiratory medications    Long  Term Goals Verbalizes importance of monitoring SPO2 with pulse oximeter and return demonstration;Maintenance of O2 saturations>88%;Exhibits proper breathing techniques, such as pursed lip breathing or other method taught during program session;Compliance with respiratory medication;Demonstrates proper use of MDI's          Oxygen Re-Evaluation:   Oxygen Discharge (Final Oxygen Re-Evaluation):   Initial Exercise Prescription:  Initial Exercise Prescription - 04/28/24 1500       Date of Initial Exercise RX and Referring Provider   Date 04/28/24    Referring Provider [name] MD      Oxygen   Maintain Oxygen Saturation 88% or higher      Recumbant Bike   Level 2    RPM 50    Watts 25    Minutes 15    METs 3.13      NuStep   Level 2    SPM 80    Minutes 15    METs 3.13      REL-XR   Level 1    Speed 50    Minutes 15    METs 3.13      T5 Nustep   Level 2    SPM 80    Minutes 15    METs 3.13      Track   Laps 26    Minutes 15    METs 2.41      Prescription Details   Frequency (times per week) 2    Duration Progress to 30 minutes of continuous aerobic without signs/symptoms of physical distress      Intensity   THRR 40-80% of Max Heartrate 117-147    Ratings of Perceived Exertion 11-13    Perceived Dyspnea 0-4      Progression   Progression Continue to progress workloads to maintain intensity without signs/symptoms of physical distress.       Resistance Training   Training Prescription Yes    Weight 4 lb    Reps 10-15          Perform Capillary Blood Glucose checks as needed.  Exercise Prescription Changes:   Exercise Prescription Changes     Row Name 04/28/24 1500             Response to Exercise   Blood Pressure (Admit) 100/70       Blood Pressure (Exercise) 120/78       Blood Pressure (Exit) 92/70       Heart Rate (Admit) 93 bpm       Heart Rate (Exercise) 117 bpm       Heart Rate (Exit) 88 bpm       Oxygen Saturation (Admit) 96 %       Oxygen Saturation (Exercise) 91 %       Oxygen Saturation (Exit) 98 %       Rating of Perceived Exertion (Exercise) 19       Perceived Dyspnea (Exercise) 2       Symptoms chest pain 9/10, resolved to 7/10 with rest       Comments results       Intensity THRR New         Progression   Average METs 3.13          Exercise Comments:   Exercise Goals and Review:   Exercise Goals     Row Name 04/28/24 1545             Exercise Goals   Increase Physical Activity Yes       Intervention Provide advice, education, support and counseling about physical activity/exercise needs.;Develop an individualized exercise prescription for aerobic and resistive training based on initial evaluation findings, risk stratification, comorbidities and participant's personal goals.       Expected Outcomes Long Term: Exercising regularly at least 3-5 days a week.;Long Term: Add in home exercise to make exercise part of routine and to increase amount of physical activity.;Short Term: Attend rehab on a regular basis to increase amount of physical activity.       Increase Strength and Stamina Yes       Intervention Provide advice, education, support and counseling about physical activity/exercise needs.;Develop an individualized exercise prescription for aerobic and resistive training based on initial evaluation findings, risk stratification, comorbidities and participant's personal  goals.       Expected Outcomes Short Term: Increase workloads from initial exercise prescription for resistance, speed, and METs.;Short Term: Perform resistance training exercises routinely during rehab and add in resistance training at home;Long Term: Improve cardiorespiratory fitness, muscular endurance and strength as measured by increased METs and functional capacity ( )       Able to understand and use rate of perceived exertion (RPE) scale Yes       Intervention Provide education and explanation on how to use RPE scale       Expected Outcomes Short Term: Able to use RPE daily in rehab to express subjective intensity level;Long Term:  Able to use RPE to guide intensity level when exercising independently       Able to understand and use Dyspnea scale Yes       Intervention Provide education and explanation on how to use Dyspnea scale       Expected Outcomes Short Term: Able to use Dyspnea scale daily in rehab to express subjective sense of shortness of breath during exertion;Long Term: Able to use Dyspnea scale to guide intensity level when exercising independently       Knowledge and understanding of Target Heart Rate Range (THRR) Yes       Intervention Provide education and explanation  of THRR including how the numbers were predicted and where they are located for reference       Expected Outcomes Short Term: Able to state/look up THRR;Short Term: Able to use daily as guideline for intensity in rehab;Long Term: Able to use THRR to govern intensity when exercising independently       Able to check pulse independently Yes       Intervention Provide education and demonstration on how to check pulse in carotid and radial arteries.;Review the importance of being able to check your own pulse for safety during independent exercise       Expected Outcomes Short Term: Able to explain why pulse checking is important during independent exercise;Long Term: Able to check pulse independently and accurately        Understanding of Exercise Prescription Yes       Intervention Provide education, explanation, and written materials on patient's individual exercise prescription       Expected Outcomes Short Term: Able to explain program exercise prescription;Long Term: Able to explain home exercise prescription to exercise independently          Exercise Goals Re-Evaluation :   Discharge Exercise Prescription (Final Exercise Prescription Changes):  Exercise Prescription Changes - 04/28/24 1500       Response to Exercise   Blood Pressure (Admit) 100/70    Blood Pressure (Exercise) 120/78    Blood Pressure (Exit) 92/70    Heart Rate (Admit) 93 bpm    Heart Rate (Exercise) 117 bpm    Heart Rate (Exit) 88 bpm    Oxygen Saturation (Admit) 96 %    Oxygen Saturation (Exercise) 91 %    Oxygen Saturation (Exit) 98 %    Rating of Perceived Exertion (Exercise) 19    Perceived Dyspnea (Exercise) 2    Symptoms chest pain 9/10, resolved to 7/10 with rest    Comments results    Intensity THRR New      Progression   Average METs 3.13          Nutrition:  Target Goals: Understanding of nutrition guidelines, daily intake of sodium 1500mg , cholesterol 200mg , calories 30% from fat and 7% or less from saturated fats, daily to have 5 or more servings of fruits and vegetables.  Education: All About Nutrition: -Group instruction provided by verbal, written material, interactive activities, discussions, models, and posters to present general guidelines for heart healthy nutrition including fat, fiber, MyPlate, the role of sodium in heart healthy nutrition, utilization of the nutrition label, and utilization of this knowledge for meal planning. Follow up email sent as well. Written material given at graduation.   Biometrics:  Pre Biometrics - 04/28/24 1546       Pre Biometrics   Height 5' 1.5 (1.562 m)    Weight 165 lb 12.8 oz (75.2 kg)    Waist Circumference 42.5 inches    Hip Circumference  42 inches    Waist to Hip Ratio 1.01 %    BMI (Calculated) 30.82    Single Leg Stand 1 seconds           Nutrition Therapy Plan and Nutrition Goals:  Nutrition Therapy & Goals - 04/28/24 1512       Nutrition Therapy   Diet Mediterranean    Protein (specify units) 70-90    Fiber 25 grams    Whole Grain Foods 3 servings    Saturated Fats 15 max. grams    Fruits and Vegetables 5 servings/day    Sodium 2  grams      Personal Nutrition Goals   Nutrition Goal Eat 3 times per day, small frequent meals or nutrient dense snacks (use protein shake if needed)    Personal Goal #2 Eat 15-30gProtein and 30-60gCarbs at each meal.    Personal Goal #3 Drink 16-32oz of water daily    Comments Patient doesn't drink much water, mostly just met dew, about 16oz daily. She doesn't eat breakfast or lunch. Usually just dinner. Spoke with her about the importance of eating more throughout the day. She was not sure she would eat more than she already was. Suggested a protein shake as a good starting point. Breaking it into two 5.5oz halves if needed. Suggested pairing it with a carb, like fruit or crackers. Provided mediterranean diet handout. Kept goals simple to eat more and drink more water.      Intervention Plan   Intervention Prescribe, educate and counsel regarding individualized specific dietary modifications aiming towards targeted core components such as weight, hypertension, lipid management, diabetes, heart failure and other comorbidities.;Nutrition handout(s) given to patient.    Expected Outcomes Long Term Goal: Adherence to prescribed nutrition plan.;Short Term Goal: A plan has been developed with personal nutrition goals set during dietitian appointment.;Short Term Goal: Understand basic principles of dietary content, such as calories, fat, sodium, cholesterol and nutrients.          Nutrition Assessments:  MEDIFICTS Score Key: >=70 Need to make dietary changes  40-70 Heart Healthy  Diet <= 40 Therapeutic Level Cholesterol Diet  Flowsheet Row Pulmonary Rehab from 04/28/2024 in [facility] Cardiac and Pulmonary Rehab  Picture Your Plate Total Score on Admission 52   Picture Your Plate Scores: <59 Unhealthy dietary pattern with much room for improvement. 41-50 Dietary pattern unlikely to meet recommendations for good health and room for improvement. 51-60 More healthful dietary pattern, with some room for improvement.  >60 Healthy dietary pattern, although there may be some specific behaviors that could be improved.   Nutrition Goals Re-Evaluation:   Nutrition Goals Discharge (Final Nutrition Goals Re-Evaluation):   Psychosocial: Target Goals: Acknowledge presence or absence of significant depression and/or stress, maximize coping skills, provide positive support system. Participant is able to verbalize types and ability to use techniques and skills needed for reducing stress and depression.   Education: Stress, Anxiety, and Depression - Group verbal and visual presentation to define topics covered.  Reviews how body is impacted by stress, anxiety, and depression.  Also discusses healthy ways to reduce stress and to treat/manage anxiety and depression.  Written material given at graduation. Flowsheet Row Pulmonary Rehab from 12/25/2022 in [facility] Cardiac and Pulmonary Rehab  Date 10/30/22  Educator [facility]  Instruction Review Code 1- [employer] Understanding    Education: Sleep Hygiene -Provides group verbal and written instruction about how sleep can affect your health.  Define sleep hygiene, discuss sleep cycles and impact of sleep habits. Review good sleep hygiene tips.    Initial Review & Psychosocial Screening:  Initial Psych Review & Screening - 04/27/24 1328       Initial Review   Current issues with None Identified      Family Dynamics   Good Support System? Yes      Barriers   Psychosocial barriers to participate in program There are no identifiable barriers or  psychosocial needs.      Screening Interventions   Interventions Encouraged to exercise;To provide support and resources with identified psychosocial needs;Provide feedback about the scores to participant    Expected  Outcomes Short Term goal: Utilizing psychosocial counselor, staff and physician to assist with identification of specific Stressors or current issues interfering with healing process. Setting desired goal for each stressor or current issue identified.;Long Term Goal: Stressors or current issues are controlled or eliminated.;Short Term goal: Identification and review with participant of any Quality of Life or Depression concerns found by scoring the questionnaire.;Long Term goal: The participant improves quality of Life and PHQ9 Scores as seen by post scores and/or verbalization of changes          Quality of Life Scores:  Scores of 19 and below usually indicate a poorer quality of life in these areas.  A difference of  2-3 points is a clinically meaningful difference.  A difference of 2-3 points in the total score of the Quality of Life Index has been associated with significant improvement in overall quality of life, self-image, physical symptoms, and general health in studies assessing change in quality of life.  PHQ-9: Review Flowsheet  More data exists      04/28/2024 12/30/2022 12/04/2022 11/01/2022 10/30/2022  Depression screen PHQ 2/9  Decreased Interest 3 3 0 3 0  Down, Depressed, Hopeless 3 3 3 3  0  PHQ - 2 Score 6 6 3 6  0  Altered sleeping 3 3 3 3 3   Tired, decreased energy 3 3 3 3 3   Change in appetite 3 3 3 3 3   Feeling bad or failure about yourself  3 3 3 3 3   Trouble concentrating 3 3 0 3 3  Moving slowly or fidgety/restless 3 3 3 3  0  Suicidal thoughts 0 0 0 0 0  PHQ-9 Score 24 24 18 24 15   Difficult doing work/chores Very difficult Very difficult Extremely dIfficult Extremely dIfficult Somewhat difficult   Interpretation of Total Score  Total Score Depression  Severity:  1-4 = Minimal depression, 5-9 = Mild depression, 10-14 = Moderate depression, 15-19 = Moderately severe depression, 20-27 = Severe depression   Psychosocial Evaluation and Intervention:  Psychosocial Evaluation - 04/27/24 1329       Psychosocial Evaluation & Interventions   Interventions Encouraged to exercise with the program and follow exercise prescription    Comments Relda is coming to pulmonary rehab with Emphysema. She has done the program before but was unable to complete it. She states she could tell that it made a difference before, so she is willing to try again to help her quality of life. Her medications are going well. She mentions needed a sleep study and is waiting for that to be scheduled. She states she doesn't have any concerns and just takes it day by day with her health.    Expected Outcomes Short: attend pulmonary rehab for education and exercise Long: develop and maintain positive self care habits    Continue Psychosocial Services  Follow up required by staff          Psychosocial Re-Evaluation:   Psychosocial Discharge (Final Psychosocial Re-Evaluation):   Education: Education Goals: Education classes will be provided on a weekly basis, covering required topics. Participant will state understanding/return demonstration of topics presented.  Learning Barriers/Preferences:  Learning Barriers/Preferences - 04/27/24 1307       Learning Barriers/Preferences   Learning Barriers None    Learning Preferences None          General Pulmonary Education Topics:  Infection Prevention: - Provides verbal and written material to individual with discussion of infection control including proper hand washing and proper equipment cleaning during exercise session.  Flowsheet Row Pulmonary Rehab from 04/28/2024 in [facility] Cardiac and Pulmonary Rehab  Date 04/28/24  Educator MB  Instruction Review Code 1- Verbalizes Understanding    Falls Prevention: - Provides  verbal and written material to individual with discussion of falls prevention and safety. Flowsheet Row Pulmonary Rehab from 04/28/2024 in [facility] Cardiac and Pulmonary Rehab  Date 04/28/24  Educator MB  Instruction Review Code 1- Verbalizes Understanding    Chronic Lung Disease Review: - Group verbal instruction with posters, models, PowerPoint presentations and videos,  to review new updates, new respiratory medications, new advancements in procedures and treatments. Providing information on websites and 800 numbers for continued self-education. Includes information about supplement oxygen, available portable oxygen systems, continuous and intermittent flow rates, oxygen safety, concentrators, and Medicare reimbursement for oxygen. Explanation of Pulmonary Drugs, including class, frequency, complications, importance of spacers, rinsing mouth after steroid MDI's, and proper cleaning methods for nebulizers. Review of basic lung anatomy and physiology related to function, structure, and complications of lung disease. Review of risk factors. Discussion about methods for diagnosing sleep apnea and types of masks and machines for OSA. Includes a review of the use of types of environmental controls: home humidity, furnaces, filters, dust mite/pet prevention, HEPA vacuums. Discussion about weather changes, air quality and the benefits of nasal washing. Instruction on Warning signs, infection symptoms, calling MD promptly, preventive modes, and value of vaccinations. Review of effective airway clearance, coughing and/or vibration techniques. Emphasizing that all should Create an Action Plan. Written material given at graduation. Flowsheet Row Pulmonary Rehab from 12/25/2022 in [facility] Cardiac and Pulmonary Rehab  Education need identified 09/30/22    AED/CPR: - Group verbal and written instruction with the use of models to demonstrate the basic use of the AED with the basic ABC's of resuscitation.    Anatomy and  Cardiac Procedures: - Group verbal and visual presentation and models provide information about basic cardiac anatomy and function. Reviews the testing methods done to diagnose heart disease and the outcomes of the test results. Describes the treatment choices: Medical Management, Angioplasty, or Coronary Bypass Surgery for treating various heart conditions including Myocardial Infarction, Angina, Valve Disease, and Cardiac Arrhythmias.  Written material given at graduation. Flowsheet Row Pulmonary Rehab from 12/25/2022 in [facility] Cardiac and Pulmonary Rehab  Date 12/04/22  Educator SB  Instruction Review Code 1- Verbalizes Understanding    Medication Safety: - Group verbal and visual instruction to review commonly prescribed medications for heart and lung disease. Reviews the medication, class of the drug, and side effects. Includes the steps to properly store meds and maintain the prescription regimen.  Written material given at graduation. Flowsheet Row Pulmonary Rehab from 12/25/2022 in [facility] Cardiac and Pulmonary Rehab  Date 10/09/22  Educator SB  Instruction Review Code 1- Verbalizes Understanding    Other: -Provides group and verbal instruction on various topics (see comments)   Knowledge Questionnaire Score:    Core Components/Risk Factors/Patient Goals at Admission:  Personal Goals and Risk Factors at Admission - 04/28/24 1548       Core Components/Risk Factors/Patient Goals on Admission    Weight Management Yes;Weight Loss    Intervention Weight Management: Develop a combined nutrition and exercise program designed to reach desired caloric intake, while maintaining appropriate intake of nutrient and fiber, sodium and fats, and appropriate energy expenditure required for the weight goal.;Weight Management: Provide education and appropriate resources to help participant work on and attain dietary goals.;Weight Management/Obesity: Establish reasonable short term and long term weight  goals.  Admit Weight 165 lb 12.8 oz (75.2 kg)    Goal Weight: Short Term 147 lb 8 oz (66.9 kg)    Goal Weight: Long Term 130 lb (59 kg)    Expected Outcomes Short Term: Continue to assess and modify interventions until short term weight is achieved;Long Term: Adherence to nutrition and physical activity/exercise program aimed toward attainment of established weight goal;Weight Loss: Understanding of general recommendations for a balanced deficit meal plan, which promotes 1-2 lb weight loss per week and includes a negative energy balance of [phone] kcal/d;Understanding recommendations for meals to include 15-35% energy as protein, 25-35% energy from fat, 35-60% energy from carbohydrates, less than 200mg  of dietary cholesterol, 20-35 gm of total fiber daily;Understanding of distribution of calorie intake throughout the day with the consumption of 4-5 meals/snacks    Improve shortness of breath with ADL's Yes    Intervention Provide education, individualized exercise plan and daily activity instruction to help decrease symptoms of SOB with activities of daily living.    Expected Outcomes Short Term: Improve cardiorespiratory fitness to achieve a reduction of symptoms when performing ADLs;Long Term: Be able to perform more ADLs without symptoms or delay the onset of symptoms    Increase knowledge of respiratory medications and ability to use respiratory devices properly  Yes    Intervention Provide education and demonstration as needed of appropriate use of medications, inhalers, and oxygen therapy.    Expected Outcomes Short Term: Achieves understanding of medications use. Understands that oxygen is a medication prescribed by physician. Demonstrates appropriate use of inhaler and oxygen therapy.;Long Term: Maintain appropriate use of medications, inhalers, and oxygen therapy.    Hypertension Yes    Intervention Provide education on lifestyle modifcations including regular physical activity/exercise,  weight management, moderate sodium restriction and increased consumption of fresh fruit, vegetables, and low fat dairy, alcohol moderation, and smoking cessation.;Monitor prescription use compliance.    Expected Outcomes Short Term: Continued assessment and intervention until BP is < 140/58mm HG in hypertensive participants. < 130/23mm HG in hypertensive participants with diabetes, heart failure or chronic kidney disease.;Long Term: Maintenance of blood pressure at goal levels.          Education:Diabetes - Individual verbal and written instruction to review signs/symptoms of diabetes, desired ranges of glucose level fasting, after meals and with exercise. Acknowledge that pre and post exercise glucose checks will be done for 3 sessions at entry of program. Flowsheet Row Pulmonary Rehab from 12/25/2022 in [facility] Cardiac and Pulmonary Rehab  Date 09/30/22  Educator [facility]  Instruction Review Code 1- Verbalizes Understanding    Know Your Numbers and Heart Failure: - Group verbal and visual instruction to discuss disease risk factors for cardiac and pulmonary disease and treatment options.  Reviews associated critical values for Overweight/Obesity, Hypertension, Cholesterol, and Diabetes.  Discusses basics of heart failure: signs/symptoms and treatments.  Introduces Heart Failure Zone chart for action plan for heart failure.  Written material given at graduation. Flowsheet Row Pulmonary Rehab from 12/25/2022 in [facility] Cardiac and Pulmonary Rehab  Date 12/25/22  Educator [facility]  Instruction Review Code 1- Verbalizes Understanding    Core Components/Risk Factors/Patient Goals Review:    Core Components/Risk Factors/Patient Goals at Discharge (Final Review):    ITP Comments:  ITP Comments     Row Name 04/27/24 1326 04/28/24 1537         ITP Comments Initial phone call completed. Diagnosis can be found in [facility] 6/12. EP Orientation scheduled for Wednesday 7/2 at 1:30. Completed and  gym orientation  for respiratory care services. Initial ITP created and sent for review to Dr. Faud Aleskerov, Medical Director.         Comments: Initial ITP

## 2024-05-04 ENCOUNTER — Encounter

## 2024-05-04 DIAGNOSIS — J432 Centrilobular emphysema: Secondary | ICD-10-CM | POA: Diagnosis not present

## 2024-05-04 MED FILL — HYDROXYUREA 500 MG CAPSULE: ORAL | 30 days supply | Qty: 60 | Fill #1

## 2024-05-04 NOTE — Progress Notes (Signed)
 Daily Session Note  Patient Details  Name: Lauren Escobar MRN: [id number] Date of Birth: [birth year] Referring Provider:   Flowsheet Row Pulmonary Rehab from 04/28/2024 in [facility] Cardiac and Pulmonary Rehab  Referring Provider [name] MD    Encounter Date: 05/04/2024  Check In:  Session Check In - 05/04/24 1052       Check-In   Supervising physician immediately available to respond to emergencies See telemetry face sheet for immediately available ER MD    Location [facility]-Cardiac & Pulmonary Rehab    Staff Present Hoy Rodney RN,BSN;Margaret Best, MS, Exercise Physiologist;Jason Elnor RDN,LDN;Sneha Willig Dyane BS, ACSM CEP, Exercise Physiologist;Lanetra Hartley RN,BSN,MPA;Maxon Conetta BS, Exercise Physiologist    Virtual Visit No    Medication changes reported     No    Fall or balance concerns reported    No    Warm-up and Cool-down Performed on first and last piece of equipment    Resistance Training Performed Yes    VAD Patient? No    PAD/SET Patient? No      Pain Assessment   Currently in Pain? No/denies             Social History   Tobacco Use  Smoking Status Former   Current packs/day: 0.00   Average packs/day: 2.8 packs/day for 29.0 years (79.8 ttl pk-yrs)   Types: Cigarettes   Start date: 10/1991   Quit date: 10/2020   Years since quitting: 3.5  Smokeless Tobacco Never    Goals Met:  Independence with exercise equipment Exercise tolerated well No report of concerns or symptoms today Strength training completed today  Goals Unmet:  Not Applicable  Comments: First full day of exercise!  Patient was oriented to gym and equipment including functions, settings, policies, and procedures.  Patient's individual exercise prescription and treatment plan were reviewed.  All starting workloads were established based on the results of the 6 minute walk test done at initial orientation visit.  The plan for exercise progression was also introduced and progression  will be customized based on patient's performance and goals.    Dr. Oneil Pinal is Medical Director for [facility] Cardiac Rehabilitation.  Dr. Fuad Aleskerov is Medical Director for [facility] Pulmonary Rehabilitation.

## 2024-05-06 ENCOUNTER — Encounter

## 2024-05-06 DIAGNOSIS — R0602 Shortness of breath: Secondary | ICD-10-CM

## 2024-05-06 DIAGNOSIS — J432 Centrilobular emphysema: Secondary | ICD-10-CM

## 2024-05-06 NOTE — Progress Notes (Signed)
 Daily Session Note  Patient Details  Name: AMAURIA YOUNTS MRN: [id number] Date of Birth: 20-Apr-1966 Referring Provider:   Flowsheet Row Pulmonary Rehab from 04/28/2024 in [facility] Cardiac and Pulmonary Rehab  Referring Provider [name] MD    Encounter Date: 05/06/2024  Check In:  Session Check In - 05/06/24 1110       Check-In   Supervising physician immediately available to respond to emergencies See telemetry face sheet for immediately available ER MD    Location [facility]-Cardiac & Pulmonary Rehab    Staff Present Othel Durand, RN, BSN, CCRP;Laureen Delores, BS, RRT, CPFT;Meredith Tressa RN,BSN;Joseph Rolinda RCP,RRT,BSRT    Virtual Visit No    Medication changes reported     No    Fall or balance concerns reported    No    Warm-up and Cool-down Performed on first and last piece of equipment    Resistance Training Performed Yes    VAD Patient? No    PAD/SET Patient? No      Pain Assessment   Currently in Pain? No/denies             Social History   Tobacco Use  Smoking Status Former   Current packs/day: 0.00   Average packs/day: 2.8 packs/day for 29.0 years (79.8 ttl pk-yrs)   Types: Cigarettes   Start date: 10/1991   Quit date: 10/2020   Years since quitting: 3.5  Smokeless Tobacco Never    Goals Met:  Proper associated with RPD/PD & O2 Sat Independence with exercise equipment Exercise tolerated well No report of concerns or symptoms today  Goals Unmet:  Not Applicable  Comments: Pt able to follow exercise prescription today without complaint.  Will continue to monitor for progression.    Dr. Oneil Pinal is Medical Director for [facility] Cardiac Rehabilitation.  Dr. Fuad Aleskerov is Medical Director for [facility] Pulmonary Rehabilitation.

## 2024-05-11 ENCOUNTER — Encounter

## 2024-05-11 DIAGNOSIS — J432 Centrilobular emphysema: Secondary | ICD-10-CM | POA: Diagnosis not present

## 2024-05-11 NOTE — Progress Notes (Signed)
 Daily Session Note  Patient Details  Name: Lauren Escobar MRN: [id number] Date of Birth: 03-22-1966 Referring Provider:   Flowsheet Row Pulmonary Rehab from 04/28/2024 in [facility] Cardiac and Pulmonary Rehab  Referring Provider [name] MD    Encounter Date: 05/11/2024  Check In:  Session Check In - 05/11/24 1120       Check-In   Supervising physician immediately available to respond to emergencies See telemetry face sheet for immediately available ER MD    Location [facility]-Cardiac & Pulmonary Rehab    Staff Present Burnard Davenport RN,BSN,MPA;Margaret Best, MS, Exercise Physiologist;Jason Elnor RDN,LDN;Laureen Delores, BS, RRT, CPFT    Virtual Visit No    Medication changes reported     No    Fall or balance concerns reported    No    Warm-up and Cool-down Performed on first and last piece of equipment    Resistance Training Performed Yes    VAD Patient? No    PAD/SET Patient? No      Pain Assessment   Currently in Pain? No/denies             Social History   Tobacco Use  Smoking Status Former   Current packs/day: 0.00   Average packs/day: 2.8 packs/day for 29.0 years (79.8 ttl pk-yrs)   Types: Cigarettes   Start date: 10/1991   Quit date: 10/2020   Years since quitting: 3.5  Smokeless Tobacco Never    Goals Met:  Independence with exercise equipment Exercise tolerated well No report of concerns or symptoms today Strength training completed today  Goals Unmet:  Not Applicable  Comments: Pt able to follow exercise prescription today without complaint.  Will continue to monitor for progression.    Dr. Oneil Pinal is Medical Director for [facility] Cardiac Rehabilitation.  Dr. Fuad Aleskerov is Medical Director for [facility] Pulmonary Rehabilitation.

## 2024-05-13 ENCOUNTER — Encounter

## 2024-05-18 ENCOUNTER — Encounter

## 2024-05-18 DIAGNOSIS — G4733 Obstructive sleep apnea (adult) (pediatric): Secondary | ICD-10-CM

## 2024-05-20 ENCOUNTER — Encounter

## 2024-05-20 DIAGNOSIS — J432 Centrilobular emphysema: Secondary | ICD-10-CM

## 2024-05-20 NOTE — Progress Notes (Signed)
 Daily Session Note  Patient Details  Name: Lauren Escobar MRN: [id number] Date of Birth: 1965/11/21 Referring Provider:   Flowsheet Row Pulmonary Rehab from 04/28/2024 in [facility] Cardiac and Pulmonary Rehab  Referring Provider [name] MD    Encounter Date: 05/20/2024  Check In:  Session Check In - 05/20/24 1107       Check-In   Supervising physician immediately available to respond to emergencies See telemetry face sheet for immediately available ER MD    Location [facility]-Cardiac & Pulmonary Rehab    Staff Present Burnard Davenport RN,BSN,MPA;Maxon Conetta BS, Exercise Physiologist;Kristen Coble RN,BC,MSN;Margaret Best, MS, Exercise Physiologist    Virtual Visit No    Medication changes reported     No    Fall or balance concerns reported    No    Warm-up and Cool-down Performed on first and last piece of equipment    Resistance Training Performed Yes    VAD Patient? No    PAD/SET Patient? No      Pain Assessment   Currently in Pain? No/denies             Social History   Tobacco Use  Smoking Status Former   Current packs/day: 0.00   Average packs/day: 2.8 packs/day for 29.0 years (79.8 ttl pk-yrs)   Types: Cigarettes   Start date: 10/1991   Quit date: 10/2020   Years since quitting: 3.5  Smokeless Tobacco Never    Goals Met:  Independence with exercise equipment Exercise tolerated well No report of concerns or symptoms today Strength training completed today  Goals Unmet:  Not Applicable  Comments: Pt able to follow exercise prescription today without complaint.  Will continue to monitor for progression.    Dr. Oneil Pinal is Medical Director for [facility] Cardiac Rehabilitation.  Dr. Fuad Aleskerov is Medical Director for [place] Pulmonary Rehabilitation.

## 2024-05-25 ENCOUNTER — Encounter

## 2024-05-25 DIAGNOSIS — J432 Centrilobular emphysema: Secondary | ICD-10-CM | POA: Diagnosis not present

## 2024-05-25 NOTE — Progress Notes (Signed)
 Daily Session Note  Patient Details  Name: Lauren Escobar MRN: [id number] Date of Birth: [birth year] Referring Provider:   Flowsheet Row Pulmonary Rehab from 04/28/2024 in [facility] Cardiac and Pulmonary Rehab  Referring Provider [name] MD    Encounter Date: 05/25/2024  Check In:  Session Check In - 05/25/24 1059       Check-In   Supervising physician immediately available to respond to emergencies See telemetry face sheet for immediately available ER MD    Location [facility]-Cardiac & Pulmonary Rehab    Staff Present Burnard Davenport RN,BSN,MPA;Maxon Conetta BS, Exercise Physiologist;Margaret Best, MS, Exercise Physiologist;Jason Elnor RDN,LDN;Noah Tickle, BS, Exercise Physiologist    Virtual Visit No    Medication changes reported     No    Fall or balance concerns reported    No    Warm-up and Cool-down Performed on first and last piece of equipment    Resistance Training Performed Yes    VAD Patient? No    PAD/SET Patient? No      Pain Assessment   Currently in Pain? No/denies             Social History   Tobacco Use  Smoking Status Former   Current packs/day: 0.00   Average packs/day: 2.8 packs/day for 29.0 years (79.8 ttl pk-yrs)   Types: Cigarettes   Start date: 10/1991   Quit date: 10/2020   Years since quitting: 3.5  Smokeless Tobacco Never    Goals Met:  Proper associated with RPD/PD & O2 Sat Independence with exercise equipment Using PLB without cueing & demonstrates good technique Exercise tolerated well No report of concerns or symptoms today Strength training completed today  Goals Unmet:  Not Applicable  Comments: Pt able to follow exercise prescription today without complaint.  Will continue to monitor for progression.    Dr. Oneil Pinal is Medical Director for [facility] Cardiac Rehabilitation.  Dr. Fuad Aleskerov is Medical Director for [facility] Pulmonary Rehabilitation.

## 2024-05-26 DIAGNOSIS — J432 Centrilobular emphysema: Secondary | ICD-10-CM

## 2024-05-26 DIAGNOSIS — R0602 Shortness of breath: Secondary | ICD-10-CM

## 2024-05-26 NOTE — Progress Notes (Signed)
 Pulmonary Individual Treatment Plan  Patient Details  Name: Lauren Escobar MRN: [id number] Date of Birth: [birth year] Referring Provider:   Flowsheet Row Pulmonary Rehab from 04/28/2024 in [facility] Cardiac and Pulmonary Rehab  Referring Provider [name] MD    Initial Encounter Date:  Flowsheet Row Pulmonary Rehab from 04/28/2024 in [facility] Cardiac and Pulmonary Rehab  Date 04/28/24    Visit Diagnosis: Centrilobular emphysema (HCC)  Shortness of breath  Patient's Home Medications on Admission:  Current Outpatient Medications:    albuterol  (PROVENTIL ) (2.5 MG/3ML) 0.083% nebulizer solution, Take 3 mLs (2.5 mg total) by nebulization every 6 (six) hours as needed for wheezing or shortness of breath. (Patient not taking: Reported on 04/12/2024), Disp: 75 mL, Rfl: 12   ARIPiprazole  (ABILIFY ) 2 MG tablet, Take 2 mg by mouth daily. (Patient not taking: Reported on 04/12/2024), Disp: , Rfl:    aspirin  EC 81 MG tablet, Take 81 mg by mouth daily., Disp: , Rfl:    atorvastatin  (LIPITOR) 20 MG tablet, Take 20 mg by mouth at bedtime., Disp: , Rfl:    benzonatate  (TESSALON ) 100 MG capsule, Take 1 capsule (100 mg total) by mouth 3 (three) times daily as needed for cough., Disp: 20 capsule, Rfl: 0   busPIRone  (BUSPAR ) 7.5 MG tablet, Take 7.5 mg by mouth 3 (three) times daily., Disp: , Rfl:    cetirizine (ZYRTEC) 10 MG tablet, Take 10 mg by mouth daily., Disp: , Rfl:    esomeprazole (NEXIUM) 40 MG capsule, Take 40 mg by mouth daily. , Disp: , Rfl:    Fluticasone -Umeclidin-Vilant (TRELEGY ELLIPTA ) 200-62.5-25 MCG/ACT AEPB, Inhale 1 puff into the lungs daily., Disp: 60 each, Rfl: 11   folic acid  (FOLVITE ) 1 MG tablet, Take 1 mg by mouth daily., Disp: , Rfl:    hydroxyurea  (HYDREA ) 500 MG capsule, Take 500 mg by mouth 2 (two) times daily. May take with food to minimize GI side effects., Disp: , Rfl:    ipratropium-albuterol  (DUONEB) 0.5-2.5 (3) MG/3ML SOLN, Take 3 mLs by nebulization every 6 (six) hours for  7 days., Disp: 90 mL, Rfl: 0   isosorbide  mononitrate (IMDUR ) 30 MG 24 hr tablet, Take 30 mg by mouth daily., Disp: , Rfl:    mirtazapine  (REMERON ) 15 MG tablet, Take 15 mg by mouth at bedtime., Disp: , Rfl:    Multiple Vitamins-Minerals (MULTIVIT/MULTIMINERAL ADULT PO), Take 1 tablet by mouth daily., Disp: , Rfl:    propranolol  (INDERAL ) 40 MG tablet, Take 40 mg by mouth 2 (two) times daily., Disp: , Rfl:   Past Medical History: Past Medical History:  Diagnosis Date   Abdominal pain    Abnormal mammogram    Anxiety    Asthma    Bleeding disorder (HCC)    Blood dyscrasia    polycythemia vera   Chronic kidney disease    COPD (chronic obstructive pulmonary disease) (HCC)    Cough    Depression    Diabetes mellitus without complication (HCC)    Fatigue    Headache 2000   Horizontal nystagmus    age 78   Hyperglycemia    Hypertension    Low back pain    Major depressive disorder    Nasal lesion    Neck pain    Snoring    SOB (shortness of breath)    Tobacco use    UTI (urinary tract infection)    Von Willebrand's disease (HCC)     Tobacco Use: Social History   Tobacco Use  Smoking Status Former  Current packs/day: 0.00   Average packs/day: 2.8 packs/day for 29.0 years (79.8 ttl pk-yrs)   Types: Cigarettes   Start date: 10/1991   Quit date: 10/2020   Years since quitting: 3.5  Smokeless Tobacco Never    Labs: Review Flowsheet  More data exists      Latest Ref Rng & Units 01/01/2014 06/16/2017 10/15/2022 01/07/2024 04/12/2024  Labs for ITP Cardiac and Pulmonary Rehab  Cholestrol 0 - 200 mg/dL 890  - 824  - -  LDL (calc) 0 - 99 mg/dL 40  - 92  - -  HDL-C >59 mg/dL 34  - 49  - -  Trlycerides <150 mg/dL 824  - 827  - -  Hemoglobin A1c 4.8 - 5.6 % 5.5  5.5  6.1  - -  Bicarbonate 20.0 - 28.0 mmol/L - - 23.4  26.5  27.3   Acid-base deficit 0.0 - 2.0 mmol/L - - 0.7  - -  O2 Saturation % - - 84.1  58.3  38      Pulmonary Assessment Scores:  Pulmonary Assessment  Scores     Row Name 04/28/24 1546         ADL UCSD   ADL Phase Entry     SOB Score total 78     Rest 3     Walk 4     Stairs 5     Bath 3     Dress 3     Shop 4       CAT Score   CAT Score 29       mMRC Score   mMRC Score 4        UCSD: Self-administered rating of dyspnea associated with activities of daily living (ADLs) 6-point scale (0 = not at all to 5 = maximal or unable to do because of breathlessness)  Scoring Scores range from 0 to 120.  Minimally important difference is 5 units  CAT: CAT can identify the health impairment of COPD patients and is better correlated with disease progression.  CAT has a scoring range of zero to 40. The CAT score is classified into four groups of low (less than 10), medium (10 - 20), high (21-30) and very high (31-40) based on the impact level of disease on health status. A CAT score over 10 suggests significant symptoms.  A worsening CAT score could be explained by an exacerbation, poor medication adherence, poor inhaler technique, or progression of COPD or comorbid conditions.  CAT MCID is 2 points  mMRC: mMRC (Modified Medical Research Council) Dyspnea Scale is used to assess the degree of baseline functional disability in patients of respiratory disease due to dyspnea. No minimal important difference is established. A decrease in score of 1 point or greater is considered a positive change.   Pulmonary Function Assessment:   Exercise Target Goals: Exercise Program Goal: Individual exercise prescription set using results from initial 6 min walk test and THRR while considering  patient's activity barriers and safety.   Exercise Prescription Goal: Initial exercise prescription builds to 30-45 minutes a day of aerobic activity, 2-3 days per week.  Home exercise guidelines will be given to patient during program as part of exercise prescription that the participant will acknowledge.  Education: Aerobic Exercise: - Group verbal and  visual presentation on the components of exercise prescription. Introduces F.I.T.T principle from ACSM for exercise prescriptions.  Reviews F.I.T.T. principles of aerobic exercise including progression. Written material given at graduation.   Education: Resistance Exercise: -  Group verbal and visual presentation on the components of exercise prescription. Introduces F.I.T.T principle from ACSM for exercise prescriptions  Reviews F.I.T.T. principles of resistance exercise including progression. Written material given at graduation. Flowsheet Row Pulmonary Rehab from 12/25/2022 in [facility] Cardiac and Pulmonary Rehab  Date 11/20/22  Educator [place]  Instruction Review Code 1- Verbalizes Understanding     Education: Exercise & Equipment Safety: - Individual verbal instruction and demonstration of equipment use and safety with use of the equipment. Flowsheet Row Pulmonary Rehab from 04/28/2024 in [facility] Cardiac and Pulmonary Rehab  Date 04/28/24  Educator [facility]  Instruction Review Code 1- Verbalizes Understanding    Education: Exercise Physiology & General Exercise Guidelines: - Group verbal and written instruction with models to review the exercise physiology of the cardiovascular system and associated critical values. Provides general exercise guidelines with specific guidelines to those with heart or lung disease.    Education: Flexibility, Balance, Mind/Body Relaxation: - Group verbal and visual presentation with interactive activity on the components of exercise prescription. Introduces F.I.T.T principle from ACSM for exercise prescriptions. Reviews F.I.T.T. principles of flexibility and balance exercise training including progression. Also discusses the mind body connection.  Reviews various relaxation techniques to help reduce and manage stress (i.e. Deep breathing, progressive muscle relaxation, and visualization). Balance handout provided to take home. Written material given at graduation. Flowsheet  Row Pulmonary Rehab from 12/25/2022 in [facility] Cardiac and Pulmonary Rehab  Date 11/20/22  Educator [facility]  Instruction Review Code 1- Verbalizes Understanding    Activity Barriers & Risk Stratification:  Activity Barriers & Cardiac Risk Stratification - 04/28/24 1542       Activity Barriers & Cardiac Risk Stratification   Activity Barriers Left Knee Replacement;Back Problems;Shortness of Breath;Balance Concerns          6 Minute Walk:  6 Minute Walk     Row Name 04/28/24 1538         6 Minute Walk   Phase Initial     Distance 1170 feet     Walk Time 6 minutes     # of Rest Breaks 0     MPH 2.2     METS 3.13     RPE 19     Perceived Dyspnea  2     Symptoms Yes (comment)     Comments Chest pain 9/10 and resolved to a 7/10 at rest     Resting HR 93 bpm     Resting BP 100/70     Resting Oxygen Saturation  96 %     Exercise Oxygen Saturation  during 6 min walk 91 %     Max Ex. HR 117 bpm     Max Ex. BP 120/78     2 Minute Post BP 92/70       Interval HR   1 Minute HR 100     2 Minute HR 117     3 Minute HR 112     4 Minute HR 110     5 Minute HR 87     6 Minute HR 95     2 Minute Post HR 91     Interval Heart Rate? Yes       Interval Oxygen   Interval Oxygen? Yes     Baseline Oxygen Saturation % 96 %     1 Minute Oxygen Saturation % 91 %     1 Minute Liters of Oxygen 0 L     2 Minute Oxygen Saturation % 91 %  2 Minute Liters of Oxygen 0 L     3 Minute Oxygen Saturation % 94 %     3 Minute Liters of Oxygen 0 L     4 Minute Oxygen Saturation % 94 %     4 Minute Liters of Oxygen 0 L     5 Minute Oxygen Saturation % 94 %     5 Minute Liters of Oxygen 0 L     6 Minute Oxygen Saturation % 93 %     6 Minute Liters of Oxygen 0 L     2 Minute Post Oxygen Saturation % 98 %     2 Minute Post Liters of Oxygen 0 L       Oxygen Initial Assessment:  Oxygen Initial Assessment - 04/27/24 1304       Home Oxygen   Home Oxygen Device None    Sleep Oxygen Prescription  None    Home Exercise Oxygen Prescription None    Home Resting Oxygen Prescription None    Compliance with Home Oxygen Use Yes      Intervention   Short Term Goals To learn and understand importance of maintaining oxygen saturations>88%;To learn and understand importance of monitoring SPO2 with pulse oximeter and demonstrate accurate use of the pulse oximeter.;To learn and demonstrate proper pursed lip breathing techniques or other breathing techniques. ;To learn and demonstrate proper use of respiratory medications    Long  Term Goals Verbalizes importance of monitoring SPO2 with pulse oximeter and return demonstration;Maintenance of O2 saturations>88%;Exhibits proper breathing techniques, such as pursed lip breathing or other method taught during program session;Compliance with respiratory medication;Demonstrates proper use of MDI's          Oxygen Re-Evaluation:  Oxygen Re-Evaluation     Row Name 05/04/24 1056             Program Oxygen Prescription   Program Oxygen Prescription None         Home Oxygen   Home Oxygen Device None       Sleep Oxygen Prescription None       Home Exercise Oxygen Prescription None       Home Resting Oxygen Prescription None       Compliance with Home Oxygen Use Yes         Goals/Expected Outcomes   Short Term Goals To learn and understand importance of maintaining oxygen saturations>88%;To learn and understand importance of monitoring SPO2 with pulse oximeter and demonstrate accurate use of the pulse oximeter.;To learn and demonstrate proper pursed lip breathing techniques or other breathing techniques. ;To learn and demonstrate proper use of respiratory medications       Long  Term Goals Verbalizes importance of monitoring SPO2 with pulse oximeter and return demonstration;Maintenance of O2 saturations>88%;Exhibits proper breathing techniques, such as pursed lip breathing or other method taught during program session;Compliance with respiratory  medication;Demonstrates proper use of MDI's       Comments Reviewed PLB technique with pt.  Talked about how it works and it's importance in maintaining their exercise saturations.       Goals/Expected Outcomes Short: Become more profiecient at using PLB. Long: Become independent at using PLB.          Oxygen Discharge (Final Oxygen Re-Evaluation):  Oxygen Re-Evaluation - 05/04/24 1056       Program Oxygen Prescription   Program Oxygen Prescription None      Home Oxygen   Home Oxygen Device None    Sleep Oxygen Prescription None  Home Exercise Oxygen Prescription None    Home Resting Oxygen Prescription None    Compliance with Home Oxygen Use Yes      Goals/Expected Outcomes   Short Term Goals To learn and understand importance of maintaining oxygen saturations>88%;To learn and understand importance of monitoring SPO2 with pulse oximeter and demonstrate accurate use of the pulse oximeter.;To learn and demonstrate proper pursed lip breathing techniques or other breathing techniques. ;To learn and demonstrate proper use of respiratory medications    Long  Term Goals Verbalizes importance of monitoring SPO2 with pulse oximeter and return demonstration;Maintenance of O2 saturations>88%;Exhibits proper breathing techniques, such as pursed lip breathing or other method taught during program session;Compliance with respiratory medication;Demonstrates proper use of MDI's    Comments Reviewed PLB technique with pt.  Talked about how it works and it's importance in maintaining their exercise saturations.    Goals/Expected Outcomes Short: Become more profiecient at using PLB. Long: Become independent at using PLB.          Initial Exercise Prescription:  Initial Exercise Prescription - 04/28/24 1500       Date of Initial Exercise RX and Referring Provider   Date 04/28/24    Referring Provider [name] MD      Oxygen   Maintain Oxygen Saturation 88% or higher      Recumbant Bike    Level 2    RPM 50    Watts 25    Minutes 15    METs 3.13      NuStep   Level 2    SPM 80    Minutes 15    METs 3.13      REL-XR   Level 1    Speed 50    Minutes 15    METs 3.13      T5 Nustep   Level 2    SPM 80    Minutes 15    METs 3.13      Track   Laps 26    Minutes 15    METs 2.41      Prescription Details   Frequency (times per week) 2    Duration Progress to 30 minutes of continuous aerobic without signs/symptoms of physical distress      Intensity   THRR 40-80% of Max Heartrate 117-147    Ratings of Perceived Exertion 11-13    Perceived Dyspnea 0-4      Progression   Progression Continue to progress workloads to maintain intensity without signs/symptoms of physical distress.      Resistance Training   Training Prescription Yes    Weight 4 lb    Reps 10-15          Perform Capillary Blood Glucose checks as needed.  Exercise Prescription Changes:   Exercise Prescription Changes     Row Name 04/28/24 1500 05/18/24 1400           Response to Exercise   Blood Pressure (Admit) 100/70 124/68      Blood Pressure (Exercise) 120/78 120/60      Blood Pressure (Exit) 92/70 118/62      Heart Rate (Admit) 93 bpm 90 bpm      Heart Rate (Exercise) 117 bpm 98 bpm      Heart Rate (Exit) 88 bpm 89 bpm      Oxygen Saturation (Admit) 96 % 95 %      Oxygen Saturation (Exercise) 91 % 92 %      Oxygen Saturation (Exit) 98 %  93 %      Rating of Perceived Exertion (Exercise) 19 14      Perceived Dyspnea (Exercise) 2 1      Symptoms chest pain 9/10, resolved to 7/10 with rest none      Comments results 1st 2 weeks of exercise sessions      Duration -- Progress to 30 minutes of  aerobic without signs/symptoms of physical distress      Intensity THRR New THRR unchanged        Progression   Progression -- Continue to progress workloads to maintain intensity without signs/symptoms of physical distress.      Average METs 3.13 2.37        Resistance  Training   Training Prescription -- Yes      Weight -- 4 lb      Reps -- 10-15        Interval Training   Interval Training -- No        NuStep   Level -- 2      Minutes -- 15      METs -- 2.2        REL-XR   Level -- 2      Minutes -- 15      METs -- 2.7        Track   Laps -- 30      Minutes -- 15      METs -- 2.63        Oxygen   Maintain Oxygen Saturation -- 88% or higher         Exercise Comments:   Exercise Comments     Row Name 05/04/24 1053           Exercise Comments First full day of exercise!  Patient was oriented to gym and equipment including functions, settings, policies, and procedures.  Patient's individual exercise prescription and treatment plan were reviewed.  All starting workloads were established based on the results of the 6 minute walk test done at initial orientation visit.  The plan for exercise progression was also introduced and progression will be customized based on patient's performance and goals.          Exercise Goals and Review:   Exercise Goals     Row Name 04/28/24 1545             Exercise Goals   Increase Physical Activity Yes       Intervention Provide advice, education, support and counseling about physical activity/exercise needs.;Develop an individualized exercise prescription for aerobic and resistive training based on initial evaluation findings, risk stratification, comorbidities and participant's personal goals.       Expected Outcomes Long Term: Exercising regularly at least 3-5 days a week.;Long Term: Add in home exercise to make exercise part of routine and to increase amount of physical activity.;Short Term: Attend rehab on a regular basis to increase amount of physical activity.       Increase Strength and Stamina Yes       Intervention Provide advice, education, support and counseling about physical activity/exercise needs.;Develop an individualized exercise prescription for aerobic and resistive training  based on initial evaluation findings, risk stratification, comorbidities and participant's personal goals.       Expected Outcomes Short Term: Increase workloads from initial exercise prescription for resistance, speed, and METs.;Short Term: Perform resistance training exercises routinely during rehab and add in resistance training at home;Long Term: Improve cardiorespiratory fitness, muscular endurance and strength as measured by  increased METs and functional capacity ( )       Able to understand and use rate of perceived exertion (RPE) scale Yes       Intervention Provide education and explanation on how to use RPE scale       Expected Outcomes Short Term: Able to use RPE daily in rehab to express subjective intensity level;Long Term:  Able to use RPE to guide intensity level when exercising independently       Able to understand and use Dyspnea scale Yes       Intervention Provide education and explanation on how to use Dyspnea scale       Expected Outcomes Short Term: Able to use Dyspnea scale daily in rehab to express subjective sense of shortness of breath during exertion;Long Term: Able to use Dyspnea scale to guide intensity level when exercising independently       Knowledge and understanding of Target Heart Rate Range (THRR) Yes       Intervention Provide education and explanation of THRR including how the numbers were predicted and where they are located for reference       Expected Outcomes Short Term: Able to state/look up THRR;Short Term: Able to use daily as guideline for intensity in rehab;Long Term: Able to use THRR to govern intensity when exercising independently       Able to check pulse independently Yes       Intervention Provide education and demonstration on how to check pulse in carotid and radial arteries.;Review the importance of being able to check your own pulse for safety during independent exercise       Expected Outcomes Short Term: Able to explain why pulse checking  is important during independent exercise;Long Term: Able to check pulse independently and accurately       Understanding of Exercise Prescription Yes       Intervention Provide education, explanation, and written materials on patient's individual exercise prescription       Expected Outcomes Short Term: Able to explain program exercise prescription;Long Term: Able to explain home exercise prescription to exercise independently          Exercise Goals Re-Evaluation :  Exercise Goals Re-Evaluation     Row Name 05/04/24 1053 05/18/24 1444           Exercise Goal Re-Evaluation   Exercise Goals Review Increase Physical Activity;Able to understand and use rate of perceived exertion (RPE) scale;Knowledge and understanding of Target Heart Rate Range (THRR);Understanding of Exercise Prescription;Able to understand and use Dyspnea scale;Able to check pulse independently;Increase Strength and Stamina Increase Physical Activity;Understanding of Exercise Prescription;Increase Strength and Stamina      Comments Reviewed RPE and dyspnea scale, THR and program prescription with pt today.  Pt voiced understanding and was given a copy of goals to take home. Aroura is off to a good start in the program and completed her first 2 weeks of exercise in this review. She tolerated her exercise prescription well. She worked at level 2 on the T4 nustep and XR. She walked 30 laps on the track. We will continue to monitor her progress in the program.      Expected Outcomes Short: Use RPE daily to regulate intensity. Long: Follow program prescription in THR. Short: Continue to follow current exercise prescription. Long: Continue exercise to improve strength and stamina.         Discharge Exercise Prescription (Final Exercise Prescription Changes):  Exercise Prescription Changes - 05/18/24 1400  Response to Exercise   Blood Pressure (Admit) 124/68    Blood Pressure (Exercise) 120/60    Blood Pressure (Exit)  118/62    Heart Rate (Admit) 90 bpm    Heart Rate (Exercise) 98 bpm    Heart Rate (Exit) 89 bpm    Oxygen Saturation (Admit) 95 %    Oxygen Saturation (Exercise) 92 %    Oxygen Saturation (Exit) 93 %    Rating of Perceived Exertion (Exercise) 14    Perceived Dyspnea (Exercise) 1    Symptoms none    Comments 1st 2 weeks of exercise sessions    Duration Progress to 30 minutes of  aerobic without signs/symptoms of physical distress    Intensity THRR unchanged      Progression   Progression Continue to progress workloads to maintain intensity without signs/symptoms of physical distress.    Average METs 2.37      Resistance Training   Training Prescription Yes    Weight 4 lb    Reps 10-15      Interval Training   Interval Training No      NuStep   Level 2    Minutes 15    METs 2.2      REL-XR   Level 2    Minutes 15    METs 2.7      Track   Laps 30    Minutes 15    METs 2.63      Oxygen   Maintain Oxygen Saturation 88% or higher          Nutrition:  Target Goals: Understanding of nutrition guidelines, daily intake of sodium 1500mg , cholesterol 200mg , calories 30% from fat and 7% or less from saturated fats, daily to have 5 or more servings of fruits and vegetables.  Education: All About Nutrition: -Group instruction provided by verbal, written material, interactive activities, discussions, models, and posters to present general guidelines for heart healthy nutrition including fat, fiber, MyPlate, the role of sodium in heart healthy nutrition, utilization of the nutrition label, and utilization of this knowledge for meal planning. Follow up email sent as well. Written material given at graduation.   Biometrics:  Pre Biometrics - 04/28/24 1546       Pre Biometrics   Height 5' 1.5 (1.562 m)    Weight 165 lb 12.8 oz (75.2 kg)    Waist Circumference 42.5 inches    Hip Circumference 42 inches    Waist to Hip Ratio 1.01 %    BMI (Calculated) 30.82    Single  Leg Stand 1 seconds           Nutrition Therapy Plan and Nutrition Goals:  Nutrition Therapy & Goals - 04/28/24 1512       Nutrition Therapy   Diet Mediterranean    Protein (specify units) 70-90    Fiber 25 grams    Whole Grain Foods 3 servings    Saturated Fats 15 max. grams    Fruits and Vegetables 5 servings/day    Sodium 2 grams      Personal Nutrition Goals   Nutrition Goal Eat 3 times per day, small frequent meals or nutrient dense snacks (use protein shake if needed)    Personal Goal #2 Eat 15-30gProtein and 30-60gCarbs at each meal.    Personal Goal #3 Drink 16-32oz of water daily    Comments Patient doesn't drink much water, mostly just met dew, about 16oz daily. She doesn't eat breakfast or lunch. Usually just dinner. Spoke with  her about the importance of eating more throughout the day. She was not sure she would eat more than she already was. Suggested a protein shake as a good starting point. Breaking it into two 5.5oz halves if needed. Suggested pairing it with a carb, like fruit or crackers. Provided mediterranean diet handout. Kept goals simple to eat more and drink more water.      Intervention Plan   Intervention Prescribe, educate and counsel regarding individualized specific dietary modifications aiming towards targeted core components such as weight, hypertension, lipid management, diabetes, heart failure and other comorbidities.;Nutrition handout(s) given to patient.    Expected Outcomes Long Term Goal: Adherence to prescribed nutrition plan.;Short Term Goal: A plan has been developed with personal nutrition goals set during dietitian appointment.;Short Term Goal: Understand basic principles of dietary content, such as calories, fat, sodium, cholesterol and nutrients.          Nutrition Assessments:  MEDIFICTS Score Key: >=70 Need to make dietary changes  40-70 Heart Healthy Diet <= 40 Therapeutic Level Cholesterol Diet  Flowsheet Row Pulmonary Rehab  from 04/28/2024 in [facility] Cardiac and Pulmonary Rehab  Picture Your Plate Total Score on Admission 52   Picture Your Plate Scores: <59 Unhealthy dietary pattern with much room for improvement. 41-50 Dietary pattern unlikely to meet recommendations for good health and room for improvement. 51-60 More healthful dietary pattern, with some room for improvement.  >60 Healthy dietary pattern, although there may be some specific behaviors that could be improved.   Nutrition Goals Re-Evaluation:   Nutrition Goals Discharge (Final Nutrition Goals Re-Evaluation):   Psychosocial: Target Goals: Acknowledge presence or absence of significant depression and/or stress, maximize coping skills, provide positive support system. Participant is able to verbalize types and ability to use techniques and skills needed for reducing stress and depression.   Education: Stress, Anxiety, and Depression - Group verbal and visual presentation to define topics covered.  Reviews how body is impacted by stress, anxiety, and depression.  Also discusses healthy ways to reduce stress and to treat/manage anxiety and depression.  Written material given at graduation. Flowsheet Row Pulmonary Rehab from 12/25/2022 in [facility] Cardiac and Pulmonary Rehab  Date 10/30/22  Educator [facility]  Instruction Review Code 1- [employer] Understanding    Education: Sleep Hygiene -Provides group verbal and written instruction about how sleep can affect your health.  Define sleep hygiene, discuss sleep cycles and impact of sleep habits. Review good sleep hygiene tips.    Initial Review & Psychosocial Screening:  Initial Psych Review & Screening - 04/27/24 1328       Initial Review   Current issues with None Identified      Family Dynamics   Good Support System? Yes      Barriers   Psychosocial barriers to participate in program There are no identifiable barriers or psychosocial needs.      Screening Interventions   Interventions Encouraged  to exercise;To provide support and resources with identified psychosocial needs;Provide feedback about the scores to participant    Expected Outcomes Short Term goal: Utilizing psychosocial counselor, staff and physician to assist with identification of specific Stressors or current issues interfering with healing process. Setting desired goal for each stressor or current issue identified.;Long Term Goal: Stressors or current issues are controlled or eliminated.;Short Term goal: Identification and review with participant of any Quality of Life or Depression concerns found by scoring the questionnaire.;Long Term goal: The participant improves quality of Life and PHQ9 Scores as seen by post scores and/or  verbalization of changes          Quality of Life Scores:  Scores of 19 and below usually indicate a poorer quality of life in these areas.  A difference of  2-3 points is a clinically meaningful difference.  A difference of 2-3 points in the total score of the Quality of Life Index has been associated with significant improvement in overall quality of life, self-image, physical symptoms, and general health in studies assessing change in quality of life.  PHQ-9: Review Flowsheet  More data exists      04/28/2024 12/30/2022 12/04/2022 11/01/2022 10/30/2022  Depression screen PHQ 2/9  Decreased Interest 3 3 0 3 0  Down, Depressed, Hopeless 3 3 3 3  0  PHQ - 2 Score 6 6 3 6  0  Altered sleeping 3 3 3 3 3   Tired, decreased energy 3 3 3 3 3   Change in appetite 3 3 3 3 3   Feeling bad or failure about yourself  3 3 3 3 3   Trouble concentrating 3 3 0 3 3  Moving slowly or fidgety/restless 3 3 3 3  0  Suicidal thoughts 0 0 0 0 0  PHQ-9 Score 24 24 18 24 15   Difficult doing work/chores Very difficult Very difficult Extremely dIfficult Extremely dIfficult Somewhat difficult   Interpretation of Total Score  Total Score Depression Severity:  1-4 = Minimal depression, 5-9 = Mild depression, 10-14 = Moderate  depression, 15-19 = Moderately severe depression, 20-27 = Severe depression   Psychosocial Evaluation and Intervention:  Psychosocial Evaluation - 04/27/24 1329       Psychosocial Evaluation & Interventions   Interventions Encouraged to exercise with the program and follow exercise prescription    Comments Lauren Escobar is coming to pulmonary rehab with Emphysema. She has done the program before but was unable to complete it. She states she could tell that it made a difference before, so she is willing to try again to help her quality of life. Her medications are going well. She mentions needed a sleep study and is waiting for that to be scheduled. She states she doesn't have any concerns and just takes it day by day with her health.    Expected Outcomes Short: attend pulmonary rehab for education and exercise Long: develop and maintain positive self care habits    Continue Psychosocial Services  Follow up required by staff          Psychosocial Re-Evaluation:   Psychosocial Discharge (Final Psychosocial Re-Evaluation):   Education: Education Goals: Education classes will be provided on a weekly basis, covering required topics. Participant will state understanding/return demonstration of topics presented.  Learning Barriers/Preferences:  Learning Barriers/Preferences - 04/27/24 1307       Learning Barriers/Preferences   Learning Barriers None    Learning Preferences None          General Pulmonary Education Topics:  Infection Prevention: - Provides verbal and written material to individual with discussion of infection control including proper hand washing and proper equipment cleaning during exercise session. Flowsheet Row Pulmonary Rehab from 04/28/2024 in [facility] Cardiac and Pulmonary Rehab  Date 04/28/24  Educator MB  Instruction Review Code 1- Verbalizes Understanding    Falls Prevention: - Provides verbal and written material to individual with discussion of falls prevention  and safety. Flowsheet Row Pulmonary Rehab from 04/28/2024 in [facility] Cardiac and Pulmonary Rehab  Date 04/28/24  Educator MB  Instruction Review Code 1- Verbalizes Understanding    Chronic Lung Disease Review: - Group verbal  instruction with posters, models, PowerPoint presentations and videos,  to review new updates, new respiratory medications, new advancements in procedures and treatments. Providing information on websites and 800 numbers for continued self-education. Includes information about supplement oxygen, available portable oxygen systems, continuous and intermittent flow rates, oxygen safety, concentrators, and Medicare reimbursement for oxygen. Explanation of Pulmonary Drugs, including class, frequency, complications, importance of spacers, rinsing mouth after steroid MDI's, and proper cleaning methods for nebulizers. Review of basic lung anatomy and physiology related to function, structure, and complications of lung disease. Review of risk factors. Discussion about methods for diagnosing sleep apnea and types of masks and machines for OSA. Includes a review of the use of types of environmental controls: home humidity, furnaces, filters, dust mite/pet prevention, HEPA vacuums. Discussion about weather changes, air quality and the benefits of nasal washing. Instruction on Warning signs, infection symptoms, calling MD promptly, preventive modes, and value of vaccinations. Review of effective airway clearance, coughing and/or vibration techniques. Emphasizing that all should Create an Action Plan. Written material given at graduation. Flowsheet Row Pulmonary Rehab from 12/25/2022 in [facility] Cardiac and Pulmonary Rehab  Education need identified 09/30/22    AED/CPR: - Group verbal and written instruction with the use of models to demonstrate the basic use of the AED with the basic ABC's of resuscitation.    Anatomy and Cardiac Procedures: - Group verbal and visual presentation and models  provide information about basic cardiac anatomy and function. Reviews the testing methods done to diagnose heart disease and the outcomes of the test results. Describes the treatment choices: Medical Management, Angioplasty, or Coronary Bypass Surgery for treating various heart conditions including Myocardial Infarction, Angina, Valve Disease, and Cardiac Arrhythmias.  Written material given at graduation. Flowsheet Row Pulmonary Rehab from 12/25/2022 in [facility] Cardiac and Pulmonary Rehab  Date 12/04/22  Educator SB  Instruction Review Code 1- Verbalizes Understanding    Medication Safety: - Group verbal and visual instruction to review commonly prescribed medications for heart and lung disease. Reviews the medication, class of the drug, and side effects. Includes the steps to properly store meds and maintain the prescription regimen.  Written material given at graduation. Flowsheet Row Pulmonary Rehab from 12/25/2022 in [facility] Cardiac and Pulmonary Rehab  Date 10/09/22  Educator SB  Instruction Review Code 1- Verbalizes Understanding    Other: -Provides group and verbal instruction on various topics (see comments)   Knowledge Questionnaire Score:  Knowledge Questionnaire Score - 05/11/24 1137       Knowledge Questionnaire Score   Pre Score 16/18           Core Components/Risk Factors/Patient Goals at Admission:  Personal Goals and Risk Factors at Admission - 04/28/24 1548       Core Components/Risk Factors/Patient Goals on Admission    Weight Management Yes;Weight Loss    Intervention Weight Management: Develop a combined nutrition and exercise program designed to reach desired caloric intake, while maintaining appropriate intake of nutrient and fiber, sodium and fats, and appropriate energy expenditure required for the weight goal.;Weight Management: Provide education and appropriate resources to help participant work on and attain dietary goals.;Weight Management/Obesity: Establish  reasonable short term and long term weight goals.    Admit Weight 165 lb 12.8 oz (75.2 kg)    Goal Weight: Short Term 147 lb 8 oz (66.9 kg)    Goal Weight: Long Term 130 lb (59 kg)    Expected Outcomes Short Term: Continue to assess and modify interventions until short term weight is  achieved;Long Term: Adherence to nutrition and physical activity/exercise program aimed toward attainment of established weight goal;Weight Loss: Understanding of general recommendations for a balanced deficit meal plan, which promotes 1-2 lb weight loss per week and includes a negative energy balance of [phone] kcal/d;Understanding recommendations for meals to include 15-35% energy as protein, 25-35% energy from fat, 35-60% energy from carbohydrates, less than 200mg  of dietary cholesterol, 20-35 gm of total fiber daily;Understanding of distribution of calorie intake throughout the day with the consumption of 4-5 meals/snacks    Improve shortness of breath with ADL's Yes    Intervention Provide education, individualized exercise plan and daily activity instruction to help decrease symptoms of SOB with activities of daily living.    Expected Outcomes Short Term: Improve cardiorespiratory fitness to achieve a reduction of symptoms when performing ADLs;Long Term: Be able to perform more ADLs without symptoms or delay the onset of symptoms    Increase knowledge of respiratory medications and ability to use respiratory devices properly  Yes    Intervention Provide education and demonstration as needed of appropriate use of medications, inhalers, and oxygen therapy.    Expected Outcomes Short Term: Achieves understanding of medications use. Understands that oxygen is a medication prescribed by physician. Demonstrates appropriate use of inhaler and oxygen therapy.;Long Term: Maintain appropriate use of medications, inhalers, and oxygen therapy.    Hypertension Yes    Intervention Provide education on lifestyle modifcations  including regular physical activity/exercise, weight management, moderate sodium restriction and increased consumption of fresh fruit, vegetables, and low fat dairy, alcohol moderation, and smoking cessation.;Monitor prescription use compliance.    Expected Outcomes Short Term: Continued assessment and intervention until BP is < 140/33mm HG in hypertensive participants. < 130/73mm HG in hypertensive participants with diabetes, heart failure or chronic kidney disease.;Long Term: Maintenance of blood pressure at goal levels.          Education:Diabetes - Individual verbal and written instruction to review signs/symptoms of diabetes, desired ranges of glucose level fasting, after meals and with exercise. Acknowledge that pre and post exercise glucose checks will be done for 3 sessions at entry of program. Flowsheet Row Pulmonary Rehab from 12/25/2022 in [facility] Cardiac and Pulmonary Rehab  Date 09/30/22  Educator [facility]  Instruction Review Code 1- Verbalizes Understanding    Know Your Numbers and Heart Failure: - Group verbal and visual instruction to discuss disease risk factors for cardiac and pulmonary disease and treatment options.  Reviews associated critical values for Overweight/Obesity, Hypertension, Cholesterol, and Diabetes.  Discusses basics of heart failure: signs/symptoms and treatments.  Introduces Heart Failure Zone chart for action plan for heart failure.  Written material given at graduation. Flowsheet Row Pulmonary Rehab from 12/25/2022 in [facility] Cardiac and Pulmonary Rehab  Date 12/25/22  Educator [facility]  Instruction Review Code 1- Verbalizes Understanding    Core Components/Risk Factors/Patient Goals Review:    Core Components/Risk Factors/Patient Goals at Discharge (Final Review):    ITP Comments:  ITP Comments     Row Name 04/27/24 1326 04/28/24 1537 05/04/24 1053 05/26/24 0942     ITP Comments Initial phone call completed. Diagnosis can be found in [facility] 6/12. EP Orientation  scheduled for Wednesday 7/2 at 1:30. Completed and gym orientation for respiratory care services. Initial ITP created and sent for review to Dr. Faud Aleskerov, Medical Director. First full day of exercise!  Patient was oriented to gym and equipment including functions, settings, policies, and procedures.  Patient's individual exercise prescription and treatment plan were reviewed.  All starting workloads were established based on the results of the 6 minute walk test done at initial orientation visit.  The plan for exercise progression was also introduced and progression will be customized based on patient's performance and goals. 30 Day review completed. Medical Director ITP review done, changes made as directed, and signed approval by Medical Director. new to program.       Comments: 30 day review

## 2024-05-27 ENCOUNTER — Encounter: Admitting: *Deleted

## 2024-05-27 DIAGNOSIS — R0683 Snoring: Secondary | ICD-10-CM | POA: Diagnosis not present

## 2024-05-27 DIAGNOSIS — J432 Centrilobular emphysema: Secondary | ICD-10-CM | POA: Diagnosis not present

## 2024-05-27 DIAGNOSIS — R069 Unspecified abnormalities of breathing: Secondary | ICD-10-CM | POA: Diagnosis not present

## 2024-05-27 DIAGNOSIS — R0602 Shortness of breath: Secondary | ICD-10-CM

## 2024-05-27 NOTE — Progress Notes (Signed)
 Daily Session Note  Patient Details  Name: Lauren Escobar MRN: [id number] Date of Birth: 1966-05-03 Referring Provider:   Flowsheet Row Pulmonary Rehab from 04/28/2024 in [facility] Cardiac and Pulmonary Rehab  Referring Provider [name] MD    Encounter Date: 05/27/2024  Check In:  Session Check In - 05/27/24 1240       Check-In   Supervising physician immediately available to respond to emergencies See telemetry face sheet for immediately available ER MD    Location [facility]-Cardiac & Pulmonary Rehab    Staff Present Maxon Conetta BS, Exercise Physiologist;Jason Elnor RDN,LDN;Joseph Rolinda RCP,RRT,BSRT;Micki Cassel Jacques RN,BSN    Virtual Visit No    Medication changes reported     No    Fall or balance concerns reported    No    Tobacco Cessation No Change    Warm-up and Cool-down Performed on first and last piece of equipment    Resistance Training Performed Yes    VAD Patient? No    PAD/SET Patient? No      Pain Assessment   Currently in Pain? No/denies             Social History   Tobacco Use  Smoking Status Former   Current packs/day: 0.00   Average packs/day: 2.8 packs/day for 29.0 years (79.8 ttl pk-yrs)   Types: Cigarettes   Start date: 10/1991   Quit date: 10/2020   Years since quitting: 3.5  Smokeless Tobacco Never    Goals Met:  Proper associated with RPD/PD & O2 Sat Independence with exercise equipment Exercise tolerated well No report of concerns or symptoms today Strength training completed today  Goals Unmet:  Not Applicable  Comments: Pt able to follow exercise prescription today without complaint.  Will continue to monitor for progression.    Dr. Oneil Pinal is Medical Director for [facility] Cardiac Rehabilitation.  Dr. Fuad Aleskerov is Medical Director for [facility] Pulmonary Rehabilitation.

## 2024-05-31 MED FILL — HYDROXYUREA 500 MG CAPSULE: ORAL | 30 days supply | Qty: 60 | Fill #2

## 2024-06-01 ENCOUNTER — Encounter: Attending: Student in an Organized Health Care Education/Training Program

## 2024-06-01 DIAGNOSIS — J432 Centrilobular emphysema: Secondary | ICD-10-CM | POA: Diagnosis present

## 2024-06-01 NOTE — Progress Notes (Signed)
 Daily Session Note  Patient Details  Name: Lauren Escobar MRN: [id number] Date of Birth: 08/21/1966 Referring Provider:   Flowsheet Row Pulmonary Rehab from 04/28/2024 in [facility] Cardiac and Pulmonary Rehab  Referring Provider [name] MD    Encounter Date: 06/01/2024  Check In:  Session Check In - 06/01/24 1058       Check-In   Supervising physician immediately available to respond to emergencies See telemetry face sheet for immediately available ER MD    Location [facility]-Cardiac & Pulmonary Rehab    Staff Present Burnard Davenport [facility] Dyane BS, ACSM CEP, Exercise Physiologist;Margaret Best, MS, Exercise Physiologist;Jason Elnor RDN,LDN;Meredith Tressa RN,BSN    Virtual Visit No    Medication changes reported     No    Fall or balance concerns reported    No    Tobacco Cessation No Change    Warm-up and Cool-down Performed on first and last piece of equipment    Resistance Training Performed Yes    VAD Patient? No    PAD/SET Patient? No      Pain Assessment   Currently in Pain? No/denies             Social History   Tobacco Use  Smoking Status Former   Current packs/day: 0.00   Average packs/day: 2.8 packs/day for 29.0 years (79.8 ttl pk-yrs)   Types: Cigarettes   Start date: 10/1991   Quit date: 10/2020   Years since quitting: 3.5  Smokeless Tobacco Never    Goals Met:  Proper associated with RPD/PD & O2 Sat Independence with exercise equipment Using PLB without cueing & demonstrates good technique Exercise tolerated well No report of concerns or symptoms today Strength training completed today  Goals Unmet:  Not Applicable  Comments: Pt able to follow exercise prescription today without complaint.  Will continue to monitor for progression.    Dr. Oneil Pinal is Medical Director for [facility] Cardiac Rehabilitation.  Dr. Fuad Aleskerov is Medical Director for [facility] Pulmonary Rehabilitation.

## 2024-06-02 ENCOUNTER — Encounter: Payer: Self-pay | Admitting: Acute Care

## 2024-06-03 ENCOUNTER — Encounter: Admitting: *Deleted

## 2024-06-03 DIAGNOSIS — J432 Centrilobular emphysema: Secondary | ICD-10-CM

## 2024-06-03 NOTE — Progress Notes (Signed)
 Daily Session Note  Patient Details  Name: Lauren Escobar MRN: [id number] Date of Birth: 03/27/1966 Referring Provider:   Flowsheet Row Pulmonary Rehab from 04/28/2024 in [facility] Cardiac and Pulmonary Rehab  Referring Provider [name] MD    Encounter Date: 06/03/2024  Check In:  Session Check In - 06/03/24 1128       Check-In   Supervising physician immediately available to respond to emergencies See telemetry face sheet for immediately available ER MD    Location [facility]-Cardiac & Pulmonary Rehab    Staff Present Maxon Burnell HECKLE, Exercise Physiologist;Meredith Tressa RN,BSN;Joseph Hood RCP,RRT,BSRT   Leita Franks RN BSN   Virtual Visit No    Medication changes reported     No    Fall or balance concerns reported    No    Warm-up and Cool-down Performed on first and last piece of equipment    Resistance Training Performed Yes    VAD Patient? No    PAD/SET Patient? No      Pain Assessment   Currently in Pain? No/denies             Social History   Tobacco Use  Smoking Status Former   Current packs/day: 0.00   Average packs/day: 2.8 packs/day for 29.0 years (79.8 ttl pk-yrs)   Types: Cigarettes   Start date: 10/1991   Quit date: 10/2020   Years since quitting: 3.6  Smokeless Tobacco Never    Goals Met:  Proper associated with RPD/PD & O2 Sat Independence with exercise equipment Exercise tolerated well No report of concerns or symptoms today  Goals Unmet:  Not Applicable  Comments: Pt able to follow exercise prescription today without complaint.  Will continue to monitor for progression.    Dr. Oneil Pinal is Medical Director for [facility] Cardiac Rehabilitation.  Dr. Fuad Aleskerov is Medical Director for [facility] Pulmonary Rehabilitation.

## 2024-06-08 ENCOUNTER — Encounter

## 2024-06-08 DIAGNOSIS — J432 Centrilobular emphysema: Secondary | ICD-10-CM | POA: Diagnosis not present

## 2024-06-08 NOTE — Progress Notes (Signed)
 Daily Session Note  Patient Details  Name: Lauren Escobar MRN: [id number] Date of Birth: 12-25-1965 Referring Provider:   Flowsheet Row Pulmonary Rehab from 04/28/2024 in [facility] Cardiac and Pulmonary Rehab  Referring Provider [name] MD    Encounter Date: 06/08/2024  Check In:  Session Check In - 06/08/24 1108       Check-In   Supervising physician immediately available to respond to emergencies See telemetry face sheet for immediately available ER MD    Location [facility]-Cardiac & Pulmonary Rehab    Staff Present Burnard Davenport RN,BSN,MPA;Maxon Conetta BS, Exercise Physiologist;Margaret Best, MS, Exercise Physiologist;Jason Elnor RDN,LDN    Virtual Visit No    Medication changes reported     No    Fall or balance concerns reported    No    Tobacco Cessation No Change    Warm-up and Cool-down Performed on first and last piece of equipment    Resistance Training Performed Yes    VAD Patient? No    PAD/SET Patient? No      Pain Assessment   Currently in Pain? No/denies             Social History   Tobacco Use  Smoking Status Former   Current packs/day: 0.00   Average packs/day: 2.8 packs/day for 29.0 years (79.8 ttl pk-yrs)   Types: Cigarettes   Start date: 10/1991   Quit date: 10/2020   Years since quitting: 3.6  Smokeless Tobacco Never    Goals Met:  Proper associated with RPD/PD & O2 Sat Independence with exercise equipment Using PLB without cueing & demonstrates good technique Exercise tolerated well No report of concerns or symptoms today Strength training completed today  Goals Unmet:  Not Applicable  Comments: Pt able to follow exercise prescription today without complaint.  Will continue to monitor for progression.    Dr. Oneil Pinal is Medical Director for [facility] Cardiac Rehabilitation.  Dr. Fuad Aleskerov is Medical Director for [facility] Pulmonary Rehabilitation.

## 2024-06-10 ENCOUNTER — Encounter: Admitting: Emergency Medicine

## 2024-06-10 DIAGNOSIS — J432 Centrilobular emphysema: Secondary | ICD-10-CM

## 2024-06-10 NOTE — Progress Notes (Signed)
 Daily Session Note  Patient Details  Name: Lauren Escobar MRN: [id number] Date of Birth: June 21, 1966 Referring Provider:   Flowsheet Row Pulmonary Rehab from 04/28/2024 in [facility] Cardiac and Pulmonary Rehab  Referring Provider [name] MD    Encounter Date: 06/10/2024  Check In:  Session Check In - 06/10/24 1123       Check-In   Supervising physician immediately available to respond to emergencies See telemetry face sheet for immediately available ER MD    Location [facility]-Cardiac & Pulmonary Rehab    Staff Present Rollene Paterson, MS, Exercise Physiologist;Maxon Conetta BS, Exercise Physiologist;Susanne Bice, RN, BSN, CCRP;Joseph Hood RCP,RRT,BSRT    Virtual Visit No    Medication changes reported     No    Fall or balance concerns reported    No    Tobacco Cessation No Change    Warm-up and Cool-down Performed on first and last piece of equipment    Resistance Training Performed Yes    VAD Patient? No    PAD/SET Patient? No      Pain Assessment   Currently in Pain? No/denies             Social History   Tobacco Use  Smoking Status Former   Current packs/day: 0.00   Average packs/day: 2.8 packs/day for 29.0 years (79.8 ttl pk-yrs)   Types: Cigarettes   Start date: 10/1991   Quit date: 10/2020   Years since quitting: 3.6  Smokeless Tobacco Never    Goals Met:  Proper associated with RPD/PD & O2 Sat Independence with exercise equipment Using PLB without cueing & demonstrates good technique Exercise tolerated well No report of concerns or symptoms today Strength training completed today  Goals Unmet:  Not Applicable  Comments: Pt able to follow exercise prescription today without complaint.  Will continue to monitor for progression.    Dr. Oneil Pinal is Medical Director for [facility] Cardiac Rehabilitation.  Dr. Fuad Aleskerov is Medical Director for [facility] Pulmonary Rehabilitation.

## 2024-06-15 ENCOUNTER — Encounter

## 2024-06-15 DIAGNOSIS — J432 Centrilobular emphysema: Secondary | ICD-10-CM

## 2024-06-15 NOTE — Progress Notes (Signed)
 Daily Session Note  Patient Details  Name: PALESTINE MOSCO MRN: [id number] Date of Birth: 01-13-1966 Referring Provider:   Flowsheet Row Pulmonary Rehab from 04/28/2024 in [facility] Cardiac and Pulmonary Rehab  Referring Provider [name] MD    Encounter Date: 06/15/2024  Check In:  Session Check In - 06/15/24 1116       Check-In   Supervising physician immediately available to respond to emergencies See telemetry face sheet for immediately available ER MD    Location [facility]-Cardiac & Pulmonary Rehab    Staff Present Burnard Davenport RN,BSN,MPA;Meredith Tressa RN,BSN;Maxon Conetta BS, Exercise Physiologist;Jason Elnor RDN,LDN    Virtual Visit No    Medication changes reported     No    Fall or balance concerns reported    No    Tobacco Cessation No Change    Warm-up and Cool-down Performed on first and last piece of equipment    Resistance Training Performed Yes    VAD Patient? No    PAD/SET Patient? No      Pain Assessment   Currently in Pain? No/denies             Social History   Tobacco Use  Smoking Status Former   Current packs/day: 0.00   Average packs/day: 2.8 packs/day for 29.0 years (79.8 ttl pk-yrs)   Types: Cigarettes   Start date: 10/1991   Quit date: 10/2020   Years since quitting: 3.6  Smokeless Tobacco Never    Goals Met:  Independence with exercise equipment Exercise tolerated well No report of concerns or symptoms today Strength training completed today  Goals Unmet:  Not Applicable  Comments: Pt able to follow exercise prescription today without complaint.  Will continue to monitor for progression.    Dr. Oneil Pinal is Medical Director for [facility] Cardiac Rehabilitation.  Dr. Fuad Aleskerov is Medical Director for [facility] Pulmonary Rehabilitation.

## 2024-06-17 ENCOUNTER — Ambulatory Visit: Admission: RE | Admit: 2024-06-17 | Source: Ambulatory Visit

## 2024-06-17 ENCOUNTER — Encounter

## 2024-06-22 ENCOUNTER — Encounter

## 2024-06-23 DIAGNOSIS — J432 Centrilobular emphysema: Secondary | ICD-10-CM

## 2024-06-23 DIAGNOSIS — R0602 Shortness of breath: Secondary | ICD-10-CM

## 2024-06-23 NOTE — Progress Notes (Signed)
 Pulmonary Individual Treatment Plan  Patient Details  Name: Lauren Escobar MRN: [id number] Date of Birth: [birth year] Referring Provider:   Flowsheet Row Pulmonary Rehab from 04/28/2024 in [facility] Cardiac and Pulmonary Rehab  Referring Provider [name] MD    Initial Encounter Date:  Flowsheet Row Pulmonary Rehab from 04/28/2024 in [facility] Cardiac and Pulmonary Rehab  Date 04/28/24    Visit Diagnosis: Shortness of breath  Centrilobular emphysema (HCC)  Patient's Home Medications on Admission:  Current Outpatient Medications:    albuterol  (PROVENTIL ) (2.5 MG/3ML) 0.083% nebulizer solution, Take 3 mLs (2.5 mg total) by nebulization every 6 (six) hours as needed for wheezing or shortness of breath. (Patient not taking: Reported on 04/12/2024), Disp: 75 mL, Rfl: 12   ARIPiprazole  (ABILIFY ) 2 MG tablet, Take 2 mg by mouth daily. (Patient not taking: Reported on 04/12/2024), Disp: , Rfl:    aspirin  EC 81 MG tablet, Take 81 mg by mouth daily., Disp: , Rfl:    atorvastatin  (LIPITOR) 20 MG tablet, Take 20 mg by mouth at bedtime., Disp: , Rfl:    benzonatate  (TESSALON ) 100 MG capsule, Take 1 capsule (100 mg total) by mouth 3 (three) times daily as needed for cough., Disp: 20 capsule, Rfl: 0   busPIRone  (BUSPAR ) 7.5 MG tablet, Take 7.5 mg by mouth 3 (three) times daily., Disp: , Rfl:    cetirizine (ZYRTEC) 10 MG tablet, Take 10 mg by mouth daily., Disp: , Rfl:    esomeprazole (NEXIUM) 40 MG capsule, Take 40 mg by mouth daily. , Disp: , Rfl:    Fluticasone -Umeclidin-Vilant (TRELEGY ELLIPTA ) 200-62.5-25 MCG/ACT AEPB, Inhale 1 puff into the lungs daily., Disp: 60 each, Rfl: 11   folic acid  (FOLVITE ) 1 MG tablet, Take 1 mg by mouth daily., Disp: , Rfl:    hydroxyurea  (HYDREA ) 500 MG capsule, Take 500 mg by mouth 2 (two) times daily. May take with food to minimize GI side effects., Disp: , Rfl:    ipratropium-albuterol  (DUONEB) 0.5-2.5 (3) MG/3ML SOLN, Take 3 mLs by nebulization every 6 (six) hours for  7 days., Disp: 90 mL, Rfl: 0   isosorbide  mononitrate (IMDUR ) 30 MG 24 hr tablet, Take 30 mg by mouth daily., Disp: , Rfl:    mirtazapine  (REMERON ) 15 MG tablet, Take 15 mg by mouth at bedtime., Disp: , Rfl:    Multiple Vitamins-Minerals (MULTIVIT/MULTIMINERAL ADULT PO), Take 1 tablet by mouth daily., Disp: , Rfl:    propranolol  (INDERAL ) 40 MG tablet, Take 40 mg by mouth 2 (two) times daily., Disp: , Rfl:   Past Medical History: Past Medical History:  Diagnosis Date   Abdominal pain    Abnormal mammogram    Anxiety    Asthma    Bleeding disorder (HCC)    Blood dyscrasia    polycythemia vera   Chronic kidney disease    COPD (chronic obstructive pulmonary disease) (HCC)    Cough    Depression    Diabetes mellitus without complication (HCC)    Fatigue    Headache 2000   Horizontal nystagmus    age 59   Hyperglycemia    Hypertension    Low back pain    Major depressive disorder    Nasal lesion    Neck pain    Snoring    SOB (shortness of breath)    Tobacco use    UTI (urinary tract infection)    Von Willebrand's disease (HCC)     Tobacco Use: Social History   Tobacco Use  Smoking Status Former  Current packs/day: 0.00   Average packs/day: 2.8 packs/day for 29.0 years (79.8 ttl pk-yrs)   Types: Cigarettes   Start date: 10/1991   Quit date: 10/2020   Years since quitting: 3.6  Smokeless Tobacco Never    Labs: Review Flowsheet  More data exists      Latest Ref Rng & Units 01/01/2014 06/16/2017 10/15/2022 01/07/2024 04/12/2024  Labs for ITP Cardiac and Pulmonary Rehab  Cholestrol 0 - 200 mg/dL 890  - 824  - -  LDL (calc) 0 - 99 mg/dL 40  - 92  - -  HDL-C >59 mg/dL 34  - 49  - -  Trlycerides <150 mg/dL 824  - 827  - -  Hemoglobin A1c 4.8 - 5.6 % 5.5  5.5  6.1  - -  Bicarbonate 20.0 - 28.0 mmol/L - - 23.4  26.5  27.3   Acid-base deficit 0.0 - 2.0 mmol/L - - 0.7  - -  O2 Saturation % - - 84.1  58.3  38      Pulmonary Assessment Scores:  Pulmonary Assessment  Scores     Row Name 04/28/24 1546         ADL UCSD   ADL Phase Entry     SOB Score total 78     Rest 3     Walk 4     Stairs 5     Bath 3     Dress 3     Shop 4       CAT Score   CAT Score 29       mMRC Score   mMRC Score 4        UCSD: Self-administered rating of dyspnea associated with activities of daily living (ADLs) 6-point scale (0 = not at all to 5 = maximal or unable to do because of breathlessness)  Scoring Scores range from 0 to 120.  Minimally important difference is 5 units  CAT: CAT can identify the health impairment of COPD patients and is better correlated with disease progression.  CAT has a scoring range of zero to 40. The CAT score is classified into four groups of low (less than 10), medium (10 - 20), high (21-30) and very high (31-40) based on the impact level of disease on health status. A CAT score over 10 suggests significant symptoms.  A worsening CAT score could be explained by an exacerbation, poor medication adherence, poor inhaler technique, or progression of COPD or comorbid conditions.  CAT MCID is 2 points  mMRC: mMRC (Modified Medical Research Council) Dyspnea Scale is used to assess the degree of baseline functional disability in patients of respiratory disease due to dyspnea. No minimal important difference is established. A decrease in score of 1 point or greater is considered a positive change.   Pulmonary Function Assessment:   Exercise Target Goals: Exercise Program Goal: Individual exercise prescription set using results from initial 6 min walk test and THRR while considering  patient's activity barriers and safety.   Exercise Prescription Goal: Initial exercise prescription builds to 30-45 minutes a day of aerobic activity, 2-3 days per week.  Home exercise guidelines will be given to patient during program as part of exercise prescription that the participant will acknowledge.  Education: Aerobic Exercise: - Group verbal and  visual presentation on the components of exercise prescription. Introduces F.I.T.T principle from ACSM for exercise prescriptions.  Reviews F.I.T.T. principles of aerobic exercise including progression. Written material provided at class time.   Education: Resistance Exercise: -  Group verbal and visual presentation on the components of exercise prescription. Introduces F.I.T.T principle from ACSM for exercise prescriptions  Reviews F.I.T.T. principles of resistance exercise including progression. Written material provided at class time. Flowsheet Row Pulmonary Rehab from 12/25/2022 in [facility] Cardiac and Pulmonary Rehab  Date 11/20/22  Educator [facility]  Instruction Review Code 1- Verbalizes Understanding     Education: Exercise & Equipment Safety: - Individual verbal instruction and demonstration of equipment use and safety with use of the equipment. Flowsheet Row Pulmonary Rehab from 04/28/2024 in [facility] Cardiac and Pulmonary Rehab  Date 04/28/24  Educator [facility]  Instruction Review Code 1- Verbalizes Understanding    Education: Exercise Physiology & General Exercise Guidelines: - Group verbal and written instruction with models to review the exercise physiology of the cardiovascular system and associated critical values. Provides general exercise guidelines with specific guidelines to those with heart or lung disease.    Education: Flexibility, Balance, Mind/Body Relaxation: - Group verbal and visual presentation with interactive activity on the components of exercise prescription. Introduces F.I.T.T principle from ACSM for exercise prescriptions. Reviews F.I.T.T. principles of flexibility and balance exercise training including progression. Also discusses the mind body connection.  Reviews various relaxation techniques to help reduce and manage stress (i.e. Deep breathing, progressive muscle relaxation, and visualization). Balance handout provided to take home. Written material provided at class  time. Flowsheet Row Pulmonary Rehab from 12/25/2022 in [facility] Cardiac and Pulmonary Rehab  Date 11/20/22  Educator [facility]  Instruction Review Code 1- Verbalizes Understanding    Activity Barriers & Risk Stratification:  Activity Barriers & Cardiac Risk Stratification - 04/28/24 1542       Activity Barriers & Cardiac Risk Stratification   Activity Barriers Left Knee Replacement;Back Problems;Shortness of Breath;Balance Concerns          6 Minute Walk:  6 Minute Walk     Row Name 04/28/24 1538         6 Minute Walk   Phase Initial     Distance 1170 feet     Walk Time 6 minutes     # of Rest Breaks 0     MPH 2.2     METS 3.13     RPE 19     Perceived Dyspnea  2     Symptoms Yes (comment)     Comments Chest pain 9/10 and resolved to a 7/10 at rest     Resting HR 93 bpm     Resting BP 100/70     Resting Oxygen Saturation  96 %     Exercise Oxygen Saturation  during 6 min walk 91 %     Max Ex. HR 117 bpm     Max Ex. BP 120/78     2 Minute Post BP 92/70       Interval HR   1 Minute HR 100     2 Minute HR 117     3 Minute HR 112     4 Minute HR 110     5 Minute HR 87     6 Minute HR 95     2 Minute Post HR 91     Interval Heart Rate? Yes       Interval Oxygen   Interval Oxygen? Yes     Baseline Oxygen Saturation % 96 %     1 Minute Oxygen Saturation % 91 %     1 Minute Liters of Oxygen 0 L     2 Minute Oxygen Saturation %  91 %     2 Minute Liters of Oxygen 0 L     3 Minute Oxygen Saturation % 94 %     3 Minute Liters of Oxygen 0 L     4 Minute Oxygen Saturation % 94 %     4 Minute Liters of Oxygen 0 L     5 Minute Oxygen Saturation % 94 %     5 Minute Liters of Oxygen 0 L     6 Minute Oxygen Saturation % 93 %     6 Minute Liters of Oxygen 0 L     2 Minute Post Oxygen Saturation % 98 %     2 Minute Post Liters of Oxygen 0 L       Oxygen Initial Assessment:  Oxygen Initial Assessment - 04/27/24 1304       Home Oxygen   Home Oxygen Device None    Sleep  Oxygen Prescription None    Home Exercise Oxygen Prescription None    Home Resting Oxygen Prescription None    Compliance with Home Oxygen Use Yes      Intervention   Short Term Goals To learn and understand importance of maintaining oxygen saturations>88%;To learn and understand importance of monitoring SPO2 with pulse oximeter and demonstrate accurate use of the pulse oximeter.;To learn and demonstrate proper pursed lip breathing techniques or other breathing techniques. ;To learn and demonstrate proper use of respiratory medications    Long  Term Goals Verbalizes importance of monitoring SPO2 with pulse oximeter and return demonstration;Maintenance of O2 saturations>88%;Exhibits proper breathing techniques, such as pursed lip breathing or other method taught during program session;Compliance with respiratory medication;Demonstrates proper use of MDI's          Oxygen Re-Evaluation:  Oxygen Re-Evaluation     Row Name 05/04/24 1056             Program Oxygen Prescription   Program Oxygen Prescription None         Home Oxygen   Home Oxygen Device None       Sleep Oxygen Prescription None       Home Exercise Oxygen Prescription None       Home Resting Oxygen Prescription None       Compliance with Home Oxygen Use Yes         Goals/Expected Outcomes   Short Term Goals To learn and understand importance of maintaining oxygen saturations>88%;To learn and understand importance of monitoring SPO2 with pulse oximeter and demonstrate accurate use of the pulse oximeter.;To learn and demonstrate proper pursed lip breathing techniques or other breathing techniques. ;To learn and demonstrate proper use of respiratory medications       Long  Term Goals Verbalizes importance of monitoring SPO2 with pulse oximeter and return demonstration;Maintenance of O2 saturations>88%;Exhibits proper breathing techniques, such as pursed lip breathing or other method taught during program session;Compliance  with respiratory medication;Demonstrates proper use of MDI's       Comments Reviewed PLB technique with pt.  Talked about how it works and it's importance in maintaining their exercise saturations.       Goals/Expected Outcomes Short: Become more profiecient at using PLB. Long: Become independent at using PLB.          Oxygen Discharge (Final Oxygen Re-Evaluation):  Oxygen Re-Evaluation - 05/04/24 1056       Program Oxygen Prescription   Program Oxygen Prescription None      Home Oxygen   Home Oxygen Device None  Sleep Oxygen Prescription None    Home Exercise Oxygen Prescription None    Home Resting Oxygen Prescription None    Compliance with Home Oxygen Use Yes      Goals/Expected Outcomes   Short Term Goals To learn and understand importance of maintaining oxygen saturations>88%;To learn and understand importance of monitoring SPO2 with pulse oximeter and demonstrate accurate use of the pulse oximeter.;To learn and demonstrate proper pursed lip breathing techniques or other breathing techniques. ;To learn and demonstrate proper use of respiratory medications    Long  Term Goals Verbalizes importance of monitoring SPO2 with pulse oximeter and return demonstration;Maintenance of O2 saturations>88%;Exhibits proper breathing techniques, such as pursed lip breathing or other method taught during program session;Compliance with respiratory medication;Demonstrates proper use of MDI's    Comments Reviewed PLB technique with pt.  Talked about how it works and it's importance in maintaining their exercise saturations.    Goals/Expected Outcomes Short: Become more profiecient at using PLB. Long: Become independent at using PLB.          Initial Exercise Prescription:  Initial Exercise Prescription - 04/28/24 1500       Date of Initial Exercise RX and Referring Provider   Date 04/28/24    Referring Provider [name] MD      Oxygen   Maintain Oxygen Saturation 88% or higher       Recumbant Bike   Level 2    RPM 50    Watts 25    Minutes 15    METs 3.13      NuStep   Level 2    SPM 80    Minutes 15    METs 3.13      REL-XR   Level 1    Speed 50    Minutes 15    METs 3.13      T5 Nustep   Level 2    SPM 80    Minutes 15    METs 3.13      Track   Laps 26    Minutes 15    METs 2.41      Prescription Details   Frequency (times per week) 2    Duration Progress to 30 minutes of continuous aerobic without signs/symptoms of physical distress      Intensity   THRR 40-80% of Max Heartrate 117-147    Ratings of Perceived Exertion 11-13    Perceived Dyspnea 0-4      Progression   Progression Continue to progress workloads to maintain intensity without signs/symptoms of physical distress.      Resistance Training   Training Prescription Yes    Weight 4 lb    Reps 10-15          Perform Capillary Blood Glucose checks as needed.  Exercise Prescription Changes:   Exercise Prescription Changes     Row Name 04/28/24 1500 05/18/24 1400 05/31/24 1600 06/17/24 0900       Response to Exercise   Blood Pressure (Admit) 100/70 124/68 132/68 96/52    Blood Pressure (Exercise) 120/78 120/60 126/68 130/70    Blood Pressure (Exit) 92/70 118/62 102/60 118/60    Heart Rate (Admit) 93 bpm 90 bpm 107 bpm 103 bpm    Heart Rate (Exercise) 117 bpm 98 bpm 106 bpm 108 bpm    Heart Rate (Exit) 88 bpm 89 bpm 89 bpm 92 bpm    Oxygen Saturation (Admit) 96 % 95 % 97 % 95 %    Oxygen Saturation (  Exercise) 91 % 92 % 96 % 91 %    Oxygen Saturation (Exit) 98 % 93 % 98 % 97 %    Rating of Perceived Exertion (Exercise) 19 14 15 14     Perceived Dyspnea (Exercise) 2 1 2 2     Symptoms chest pain 9/10, resolved to 7/10 with rest none none none    Comments results 1st 2 weeks of exercise sessions -- --    Duration -- Progress to 30 minutes of  aerobic without signs/symptoms of physical distress Continue with 30 min of aerobic exercise without signs/symptoms of  physical distress. Continue with 30 min of aerobic exercise without signs/symptoms of physical distress.    Intensity THRR New THRR unchanged THRR unchanged THRR unchanged      Progression   Progression -- Continue to progress workloads to maintain intensity without signs/symptoms of physical distress. Continue to progress workloads to maintain intensity without signs/symptoms of physical distress. Continue to progress workloads to maintain intensity without signs/symptoms of physical distress.    Average METs 3.13 2.37 2.78 2.52      Resistance Training   Training Prescription -- Yes Yes Yes    Weight -- 4 lb 4 lb 4 lb    Reps -- 10-15 10-15 10-15      Interval Training   Interval Training -- No No No      Treadmill   MPH -- -- -- 1.5    Minutes -- -- -- 15    METs -- -- -- 2.15      NuStep   Level -- 2 3 2     Minutes -- 15 15 15     METs -- 2.2 2.4 2      REL-XR   Level -- 2 2 4     Minutes -- 15 15 15     METs -- 2.7 3.7 --      Track   Laps -- 30 30 17     Minutes -- 15 15 15     METs -- 2.63 2.63 1.92      Oxygen   Maintain Oxygen Saturation -- 88% or higher 88% or higher 88% or higher       Exercise Comments:   Exercise Comments     Row Name 05/04/24 1053           Exercise Comments First full day of exercise!  Patient was oriented to gym and equipment including functions, settings, policies, and procedures.  Patient's individual exercise prescription and treatment plan were reviewed.  All starting workloads were established based on the results of the 6 minute walk test done at initial orientation visit.  The plan for exercise progression was also introduced and progression will be customized based on patient's performance and goals.          Exercise Goals and Review:   Exercise Goals     Row Name 04/28/24 1545             Exercise Goals   Increase Physical Activity Yes       Intervention Provide advice, education, support and counseling about  physical activity/exercise needs.;Develop an individualized exercise prescription for aerobic and resistive training based on initial evaluation findings, risk stratification, comorbidities and participant's personal goals.       Expected Outcomes Long Term: Exercising regularly at least 3-5 days a week.;Long Term: Add in home exercise to make exercise part of routine and to increase amount of physical activity.;Short Term: Attend rehab on a regular basis to increase  amount of physical activity.       Increase Strength and Stamina Yes       Intervention Provide advice, education, support and counseling about physical activity/exercise needs.;Develop an individualized exercise prescription for aerobic and resistive training based on initial evaluation findings, risk stratification, comorbidities and participant's personal goals.       Expected Outcomes Short Term: Increase workloads from initial exercise prescription for resistance, speed, and METs.;Short Term: Perform resistance training exercises routinely during rehab and add in resistance training at home;Long Term: Improve cardiorespiratory fitness, muscular endurance and strength as measured by increased METs and functional capacity ( )       Able to understand and use rate of perceived exertion (RPE) scale Yes       Intervention Provide education and explanation on how to use RPE scale       Expected Outcomes Short Term: Able to use RPE daily in rehab to express subjective intensity level;Long Term:  Able to use RPE to guide intensity level when exercising independently       Able to understand and use Dyspnea scale Yes       Intervention Provide education and explanation on how to use Dyspnea scale       Expected Outcomes Short Term: Able to use Dyspnea scale daily in rehab to express subjective sense of shortness of breath during exertion;Long Term: Able to use Dyspnea scale to guide intensity level when exercising independently       Knowledge  and understanding of Target Heart Rate Range (THRR) Yes       Intervention Provide education and explanation of THRR including how the numbers were predicted and where they are located for reference       Expected Outcomes Short Term: Able to state/look up THRR;Short Term: Able to use daily as guideline for intensity in rehab;Long Term: Able to use THRR to govern intensity when exercising independently       Able to check pulse independently Yes       Intervention Provide education and demonstration on how to check pulse in carotid and radial arteries.;Review the importance of being able to check your own pulse for safety during independent exercise       Expected Outcomes Short Term: Able to explain why pulse checking is important during independent exercise;Long Term: Able to check pulse independently and accurately       Understanding of Exercise Prescription Yes       Intervention Provide education, explanation, and written materials on patient's individual exercise prescription       Expected Outcomes Short Term: Able to explain program exercise prescription;Long Term: Able to explain home exercise prescription to exercise independently          Exercise Goals Re-Evaluation :  Exercise Goals Re-Evaluation     Row Name 05/04/24 1053 05/18/24 1444 05/31/24 1618 06/17/24 0923       Exercise Goal Re-Evaluation   Exercise Goals Review Increase Physical Activity;Able to understand and use rate of perceived exertion (RPE) scale;Knowledge and understanding of Target Heart Rate Range (THRR);Understanding of Exercise Prescription;Able to understand and use Dyspnea scale;Able to check pulse independently;Increase Strength and Stamina Increase Physical Activity;Understanding of Exercise Prescription;Increase Strength and Stamina Increase Physical Activity;Understanding of Exercise Prescription;Increase Strength and Stamina Increase Physical Activity;Understanding of Exercise Prescription;Increase  Strength and Stamina    Comments Reviewed RPE and dyspnea scale, THR and program prescription with pt today.  Pt voiced understanding and was given a copy of goals to take home.  Josey is off to a good start in the program and completed her first 2 weeks of exercise in this review. She tolerated her exercise prescription well. She worked at level 2 on the T4 nustep and XR. She walked 30 laps on the track. We will continue to monitor her progress in the program. Aradhya is doing well in rehab. She continues to walk 30 laps on the track. She also recently improved to level 3 on the T4 nustep. We will continue to monitor her progress in the program. Karliah is doing well in rehab. She was recently able to increase from level 2 to 4 on the XR. She was also able to add the treadmill to her current exercise prescription at a speed of 1.5 and no incline. We will continue to monitor her progress in the program.    Expected Outcomes Short: Use RPE daily to regulate intensity. Long: Follow program prescription in THR. Short: Continue to follow current exercise prescription. Long: Continue exercise to improve strength and stamina. Short: Continue to progressively increase workloads. Long: Continue exercise to improve strength and stamina. Short: Continue to follow exercise prescription, and increase workloads when able. Long: Continue exercise to improve strength and stamina.       Discharge Exercise Prescription (Final Exercise Prescription Changes):  Exercise Prescription Changes - 06/17/24 0900       Response to Exercise   Blood Pressure (Admit) 96/52    Blood Pressure (Exercise) 130/70    Blood Pressure (Exit) 118/60    Heart Rate (Admit) 103 bpm    Heart Rate (Exercise) 108 bpm    Heart Rate (Exit) 92 bpm    Oxygen Saturation (Admit) 95 %    Oxygen Saturation (Exercise) 91 %    Oxygen Saturation (Exit) 97 %    Rating of Perceived Exertion (Exercise) 14    Perceived Dyspnea (Exercise) 2    Symptoms none     Duration Continue with 30 min of aerobic exercise without signs/symptoms of physical distress.    Intensity THRR unchanged      Progression   Progression Continue to progress workloads to maintain intensity without signs/symptoms of physical distress.    Average METs 2.52      Resistance Training   Training Prescription Yes    Weight 4 lb    Reps 10-15      Interval Training   Interval Training No      Treadmill   MPH 1.5    Minutes 15    METs 2.15      NuStep   Level 2    Minutes 15    METs 2      REL-XR   Level 4    Minutes 15      Track   Laps 17    Minutes 15    METs 1.92      Oxygen   Maintain Oxygen Saturation 88% or higher          Nutrition:  Target Goals: Understanding of nutrition guidelines, daily intake of sodium 1500mg , cholesterol 200mg , calories 30% from fat and 7% or less from saturated fats, daily to have 5 or more servings of fruits and vegetables.  Education: Nutrition 1 -Group instruction provided by verbal, written material, interactive activities, discussions, models, and posters to present general guidelines for heart healthy nutrition including macronutrients, label reading, and promoting whole foods over processed counterparts. Education serves as Pensions consultant of discussion of heart healthy eating for all. Written material provided at class time.  Education: Nutrition 2 -Group instruction provided by verbal, written material, interactive activities, discussions, models, and posters to present general guidelines for heart healthy nutrition including sodium, cholesterol, and saturated fat. Providing guidance of habit forming to improve blood pressure, cholesterol, and body weight. Written material provided at class time.     Biometrics:  Pre Biometrics - 04/28/24 1546       Pre Biometrics   Height 5' 1.5 (1.562 m)    Weight 165 lb 12.8 oz (75.2 kg)    Waist Circumference 42.5 inches    Hip Circumference 42 inches    Waist  to Hip Ratio 1.01 %    BMI (Calculated) 30.82    Single Leg Stand 1 seconds           Nutrition Therapy Plan and Nutrition Goals:  Nutrition Therapy & Goals - 04/28/24 1512       Nutrition Therapy   Diet Mediterranean    Protein (specify units) 70-90    Fiber 25 grams    Whole Grain Foods 3 servings    Saturated Fats 15 max. grams    Fruits and Vegetables 5 servings/day    Sodium 2 grams      Personal Nutrition Goals   Nutrition Goal Eat 3 times per day, small frequent meals or nutrient dense snacks (use protein shake if needed)    Personal Goal #2 Eat 15-30gProtein and 30-60gCarbs at each meal.    Personal Goal #3 Drink 16-32oz of water daily    Comments Patient doesn't drink much water, mostly just met dew, about 16oz daily. She doesn't eat breakfast or lunch. Usually just dinner. Spoke with her about the importance of eating more throughout the day. She was not sure she would eat more than she already was. Suggested a protein shake as a good starting point. Breaking it into two 5.5oz halves if needed. Suggested pairing it with a carb, like fruit or crackers. Provided mediterranean diet handout. Kept goals simple to eat more and drink more water.      Intervention Plan   Intervention Prescribe, educate and counsel regarding individualized specific dietary modifications aiming towards targeted core components such as weight, hypertension, lipid management, diabetes, heart failure and other comorbidities.;Nutrition handout(s) given to patient.    Expected Outcomes Long Term Goal: Adherence to prescribed nutrition plan.;Short Term Goal: A plan has been developed with personal nutrition goals set during dietitian appointment.;Short Term Goal: Understand basic principles of dietary content, such as calories, fat, sodium, cholesterol and nutrients.          Nutrition Assessments:  MEDIFICTS Score Key: >=70 Need to make dietary changes  40-70 Heart Healthy Diet <= 40 Therapeutic  Level Cholesterol Diet  Flowsheet Row Pulmonary Rehab from 04/28/2024 in [facility] Cardiac and Pulmonary Rehab  Picture Your Plate Total Score on Admission 52   Picture Your Plate Scores: <59 Unhealthy dietary pattern with much room for improvement. 41-50 Dietary pattern unlikely to meet recommendations for good health and room for improvement. 51-60 More healthful dietary pattern, with some room for improvement.  >60 Healthy dietary pattern, although there may be some specific behaviors that could be improved.   Nutrition Goals Re-Evaluation:   Nutrition Goals Discharge (Final Nutrition Goals Re-Evaluation):   Psychosocial: Target Goals: Acknowledge presence or absence of significant depression and/or stress, maximize coping skills, provide positive support system. Participant is able to verbalize types and ability to use techniques and skills needed for reducing stress and depression.   Education: Stress, Anxiety, and Depression -  Group verbal and visual presentation to define topics covered.  Reviews how body is impacted by stress, anxiety, and depression.  Also discusses healthy ways to reduce stress and to treat/manage anxiety and depression.  Written material provided at class time. Flowsheet Row Pulmonary Rehab from 12/25/2022 in [facility] Cardiac and Pulmonary Rehab  Date 10/30/22  Educator [facility]  Instruction Review Code 1- [employer] Understanding    Education: Sleep Hygiene -Provides group verbal and written instruction about how sleep can affect your health.  Define sleep hygiene, discuss sleep cycles and impact of sleep habits. Review good sleep hygiene tips.    Initial Review & Psychosocial Screening:  Initial Psych Review & Screening - 04/27/24 1328       Initial Review   Current issues with None Identified      Family Dynamics   Good Support System? Yes      Barriers   Psychosocial barriers to participate in program There are no identifiable barriers or psychosocial needs.       Screening Interventions   Interventions Encouraged to exercise;To provide support and resources with identified psychosocial needs;Provide feedback about the scores to participant    Expected Outcomes Short Term goal: Utilizing psychosocial counselor, staff and physician to assist with identification of specific Stressors or current issues interfering with healing process. Setting desired goal for each stressor or current issue identified.;Long Term Goal: Stressors or current issues are controlled or eliminated.;Short Term goal: Identification and review with participant of any Quality of Life or Depression concerns found by scoring the questionnaire.;Long Term goal: The participant improves quality of Life and PHQ9 Scores as seen by post scores and/or verbalization of changes          Quality of Life Scores:  Scores of 19 and below usually indicate a poorer quality of life in these areas.  A difference of  2-3 points is a clinically meaningful difference.  A difference of 2-3 points in the total score of the Quality of Life Index has been associated with significant improvement in overall quality of life, self-image, physical symptoms, and general health in studies assessing change in quality of life.  PHQ-9: Review Flowsheet  More data exists      04/28/2024 12/30/2022 12/04/2022 11/01/2022 10/30/2022  Depression screen PHQ 2/9  Decreased Interest 3 3 0 3 0  Down, Depressed, Hopeless 3 3 3 3  0  PHQ - 2 Score 6 6 3 6  0  Altered sleeping 3 3 3 3 3   Tired, decreased energy 3 3 3 3 3   Change in appetite 3 3 3 3 3   Feeling bad or failure about yourself  3 3 3 3 3   Trouble concentrating 3 3 0 3 3  Moving slowly or fidgety/restless 3 3 3 3  0  Suicidal thoughts 0 0 0 0 0  PHQ-9 Score 24 24 18 24 15   Difficult doing work/chores Very difficult Very difficult Extremely dIfficult Extremely dIfficult Somewhat difficult   Interpretation of Total Score  Total Score Depression Severity:  1-4 = Minimal  depression, 5-9 = Mild depression, 10-14 = Moderate depression, 15-19 = Moderately severe depression, 20-27 = Severe depression   Psychosocial Evaluation and Intervention:  Psychosocial Evaluation - 04/27/24 1329       Psychosocial Evaluation & Interventions   Interventions Encouraged to exercise with the program and follow exercise prescription    Comments Raela is coming to pulmonary rehab with Emphysema. She has done the program before but was unable to complete it. She states she  could tell that it made a difference before, so she is willing to try again to help her quality of life. Her medications are going well. She mentions needed a sleep study and is waiting for that to be scheduled. She states she doesn't have any concerns and just takes it day by day with her health.    Expected Outcomes Short: attend pulmonary rehab for education and exercise Long: develop and maintain positive self care habits    Continue Psychosocial Services  Follow up required by staff          Psychosocial Re-Evaluation:   Psychosocial Discharge (Final Psychosocial Re-Evaluation):   Education: Education Goals: Education classes will be provided on a weekly basis, covering required topics. Participant will state understanding/return demonstration of topics presented.  Learning Barriers/Preferences:  Learning Barriers/Preferences - 04/27/24 1307       Learning Barriers/Preferences   Learning Barriers None    Learning Preferences None          General Pulmonary Education Topics:  Infection Prevention: - Provides verbal and written material to individual with discussion of infection control including proper hand washing and proper equipment cleaning during exercise session. Flowsheet Row Pulmonary Rehab from 04/28/2024 in [facility] Cardiac and Pulmonary Rehab  Date 04/28/24  Educator MB  Instruction Review Code 1- Verbalizes Understanding    Falls Prevention: - Provides verbal and written  material to individual with discussion of falls prevention and safety. Flowsheet Row Pulmonary Rehab from 04/28/2024 in [facility] Cardiac and Pulmonary Rehab  Date 04/28/24  Educator MB  Instruction Review Code 1- Verbalizes Understanding    Chronic Lung Disease Review: - Group verbal instruction with posters, models, PowerPoint presentations and videos,  to review new updates, new respiratory medications, new advancements in procedures and treatments. Providing information on websites and 800 numbers for continued self-education. Includes information about supplement oxygen, available portable oxygen systems, continuous and intermittent flow rates, oxygen safety, concentrators, and Medicare reimbursement for oxygen. Explanation of Pulmonary Drugs, including class, frequency, complications, importance of spacers, rinsing mouth after steroid MDI's, and proper cleaning methods for nebulizers. Review of basic lung anatomy and physiology related to function, structure, and complications of lung disease. Review of risk factors. Discussion about methods for diagnosing sleep apnea and types of masks and machines for OSA. Includes a review of the use of types of environmental controls: home humidity, furnaces, filters, dust mite/pet prevention, HEPA vacuums. Discussion about weather changes, air quality and the benefits of nasal washing. Instruction on Warning signs, infection symptoms, calling MD promptly, preventive modes, and value of vaccinations. Review of effective airway clearance, coughing and/or vibration techniques. Emphasizing that all should Create an Action Plan. Written material provided at class time. Flowsheet Row Pulmonary Rehab from 12/25/2022 in [facility] Cardiac and Pulmonary Rehab  Education need identified 09/30/22    AED/CPR: - Group verbal and written instruction with the use of models to demonstrate the basic use of the AED with the basic ABC's of resuscitation.    Tests and Procedures:  -  Group verbal and visual presentation and models provide information about basic cardiac anatomy and function. Reviews the testing methods done to diagnose heart disease and the outcomes of the test results. Describes the treatment choices: Medical Management, Angioplasty, or Coronary Bypass Surgery for treating various heart conditions including Myocardial Infarction, Angina, Valve Disease, and Cardiac Arrhythmias.  Written material provided at class time. Flowsheet Row Pulmonary Rehab from 12/25/2022 in [facility] Cardiac and Pulmonary Rehab  Date 12/04/22  Educator SB  Instruction Review Code 1- Verbalizes Understanding    Medication Safety: - Group verbal and visual instruction to review commonly prescribed medications for heart and lung disease. Reviews the medication, class of the drug, and side effects. Includes the steps to properly store meds and maintain the prescription regimen.  Written material given at graduation. Flowsheet Row Pulmonary Rehab from 12/25/2022 in [facility] Cardiac and Pulmonary Rehab  Date 10/09/22  Educator SB  Instruction Review Code 1- Verbalizes Understanding    Other: -Provides group and verbal instruction on various topics (see comments)   Knowledge Questionnaire Score:  Knowledge Questionnaire Score - 05/11/24 1137       Knowledge Questionnaire Score   Pre Score 16/18           Core Components/Risk Factors/Patient Goals at Admission:  Personal Goals and Risk Factors at Admission - 04/28/24 1548       Core Components/Risk Factors/Patient Goals on Admission    Weight Management Yes;Weight Loss    Intervention Weight Management: Develop a combined nutrition and exercise program designed to reach desired caloric intake, while maintaining appropriate intake of nutrient and fiber, sodium and fats, and appropriate energy expenditure required for the weight goal.;Weight Management: Provide education and appropriate resources to help participant work on and attain  dietary goals.;Weight Management/Obesity: Establish reasonable short term and long term weight goals.    Admit Weight 165 lb 12.8 oz (75.2 kg)    Goal Weight: Short Term 147 lb 8 oz (66.9 kg)    Goal Weight: Long Term 130 lb (59 kg)    Expected Outcomes Short Term: Continue to assess and modify interventions until short term weight is achieved;Long Term: Adherence to nutrition and physical activity/exercise program aimed toward attainment of established weight goal;Weight Loss: Understanding of general recommendations for a balanced deficit meal plan, which promotes 1-2 lb weight loss per week and includes a negative energy balance of [phone] kcal/d;Understanding recommendations for meals to include 15-35% energy as protein, 25-35% energy from fat, 35-60% energy from carbohydrates, less than 200mg  of dietary cholesterol, 20-35 gm of total fiber daily;Understanding of distribution of calorie intake throughout the day with the consumption of 4-5 meals/snacks    Improve shortness of breath with ADL's Yes    Intervention Provide education, individualized exercise plan and daily activity instruction to help decrease symptoms of SOB with activities of daily living.    Expected Outcomes Short Term: Improve cardiorespiratory fitness to achieve a reduction of symptoms when performing ADLs;Long Term: Be able to perform more ADLs without symptoms or delay the onset of symptoms    Increase knowledge of respiratory medications and ability to use respiratory devices properly  Yes    Intervention Provide education and demonstration as needed of appropriate use of medications, inhalers, and oxygen therapy.    Expected Outcomes Short Term: Achieves understanding of medications use. Understands that oxygen is a medication prescribed by physician. Demonstrates appropriate use of inhaler and oxygen therapy.;Long Term: Maintain appropriate use of medications, inhalers, and oxygen therapy.    Hypertension Yes    Intervention  Provide education on lifestyle modifcations including regular physical activity/exercise, weight management, moderate sodium restriction and increased consumption of fresh fruit, vegetables, and low fat dairy, alcohol moderation, and smoking cessation.;Monitor prescription use compliance.    Expected Outcomes Short Term: Continued assessment and intervention until BP is < 140/49mm HG in hypertensive participants. < 130/57mm HG in hypertensive participants with diabetes, heart failure or chronic kidney disease.;Long Term: Maintenance of blood pressure at goal levels.  Education:Diabetes - Individual verbal and written instruction to review signs/symptoms of diabetes, desired ranges of glucose level fasting, after meals and with exercise. Acknowledge that pre and post exercise glucose checks will be done for 3 sessions at entry of program. Flowsheet Row Pulmonary Rehab from 12/25/2022 in [facility] Cardiac and Pulmonary Rehab  Date 09/30/22  Educator [facility]  Instruction Review Code 1- Verbalizes Understanding    Know Your Numbers and Heart Failure: - Group verbal and visual instruction to discuss disease risk factors for cardiac and pulmonary disease and treatment options.  Reviews associated critical values for Overweight/Obesity, Hypertension, Cholesterol, and Diabetes.  Discusses basics of heart failure: signs/symptoms and treatments.  Introduces Heart Failure Zone chart for action plan for heart failure. Written material provided at class time. Flowsheet Row Pulmonary Rehab from 12/25/2022 in [facility] Cardiac and Pulmonary Rehab  Date 12/25/22  Educator [facility]  Instruction Review Code 1- Verbalizes Understanding    Core Components/Risk Factors/Patient Goals Review:    Core Components/Risk Factors/Patient Goals at Discharge (Final Review):    ITP Comments:  ITP Comments     Row Name 04/27/24 1326 04/28/24 1537 05/04/24 1053 05/26/24 0942 06/23/24 0941   ITP Comments Initial phone call  completed. Diagnosis can be found in [facility] 6/12. EP Orientation scheduled for Wednesday 7/2 at 1:30. Completed and gym orientation for respiratory care services. Initial ITP created and sent for review to Dr. Faud Aleskerov, Medical Director. First full day of exercise!  Patient was oriented to gym and equipment including functions, settings, policies, and procedures.  Patient's individual exercise prescription and treatment plan were reviewed.  All starting workloads were established based on the results of the 6 minute walk test done at initial orientation visit.  The plan for exercise progression was also introduced and progression will be customized based on patient's performance and goals. 30 Day review completed. Medical Director ITP review done, changes made as directed, and signed approval by Medical Director. new to program. 30 Day review completed. Medical Director ITP review done; changes made as directed and signed approval by Medical Director.      Comments: 30 day review

## 2024-06-24 ENCOUNTER — Encounter: Admitting: Emergency Medicine

## 2024-06-24 DIAGNOSIS — J432 Centrilobular emphysema: Secondary | ICD-10-CM | POA: Diagnosis not present

## 2024-06-24 NOTE — Progress Notes (Signed)
 Daily Session Note  Patient Details  Name: Lauren Escobar MRN: [id number] Date of Birth: Sep 26, 1966 Referring Provider:   Flowsheet Row Pulmonary Rehab from 04/28/2024 in [facility] Cardiac and Pulmonary Rehab  Referring Provider [name] MD    Encounter Date: 06/24/2024  Check In:  Session Check In - 06/24/24 1122       Check-In   Supervising physician immediately available to respond to emergencies See telemetry face sheet for immediately available ER MD    Location [facility]-Cardiac & Pulmonary Rehab    Staff Present Devaughn Jaeger, BS, Exercise Physiologist;Maxon Conetta BS, Exercise Physiologist;Joseph Rolinda RCP,RRT,BSRT;Kaylen Motl RN,BSN    Virtual Visit No    Medication changes reported     No    Fall or balance concerns reported    No    Tobacco Cessation No Change    Warm-up and Cool-down Performed on first and last piece of equipment    Resistance Training Performed Yes    VAD Patient? No    PAD/SET Patient? No      Pain Assessment   Currently in Pain? No/denies             Social History   Tobacco Use  Smoking Status Former   Current packs/day: 0.00   Average packs/day: 2.8 packs/day for 29.0 years (79.8 ttl pk-yrs)   Types: Cigarettes   Start date: 10/1991   Quit date: 10/2020   Years since quitting: 3.6  Smokeless Tobacco Never    Goals Met:  Proper associated with RPD/PD & O2 Sat Independence with exercise equipment Using PLB without cueing & demonstrates good technique Exercise tolerated well No report of concerns or symptoms today Strength training completed today  Goals Unmet:  Not Applicable  Comments: Pt able to follow exercise prescription today without complaint.  Will continue to monitor for progression.    Dr. Oneil Pinal is Medical Director for [facility] Cardiac Rehabilitation.  Dr. Fuad Aleskerov is Medical Director for [facility] Pulmonary Rehabilitation.

## 2024-06-28 MED FILL — HYDROXYUREA 500 MG CAPSULE: ORAL | 30 days supply | Qty: 60 | Fill #3

## 2024-06-29 ENCOUNTER — Encounter

## 2024-07-01 ENCOUNTER — Encounter

## 2024-07-06 ENCOUNTER — Encounter: Attending: Student in an Organized Health Care Education/Training Program

## 2024-07-06 DIAGNOSIS — J432 Centrilobular emphysema: Secondary | ICD-10-CM | POA: Insufficient documentation

## 2024-07-06 NOTE — Progress Notes (Signed)
 Daily Session Note  Patient Details  Name: Lauren Escobar MRN: [id number] Date of Birth: 04-07-1966 Referring Provider:   Flowsheet Row Pulmonary Rehab from 04/28/2024 in [facility] Cardiac and Pulmonary Rehab  Referring Provider [name] MD    Encounter Date: 07/06/2024  Check In:  Session Check In - 07/06/24 1057       Check-In   Supervising physician immediately available to respond to emergencies See telemetry face sheet for immediately available ER MD    Location [facility]-Cardiac & Pulmonary Rehab    Staff Present Burnard Davenport RN,BSN,MPA;Meredith Tressa RN,BSN;Maxon Burnell BS, Exercise Physiologist;Margaret Best, MS, Exercise Physiologist;Jason Elnor RDN,LDN    Virtual Visit No    Medication changes reported     No    Fall or balance concerns reported    No    Tobacco Cessation No Change    Warm-up and Cool-down Performed on first and last piece of equipment    Resistance Training Performed Yes    VAD Patient? No    PAD/SET Patient? No      Pain Assessment   Currently in Pain? No/denies             Social History   Tobacco Use  Smoking Status Former   Current packs/day: 0.00   Average packs/day: 2.8 packs/day for 29.0 years (79.8 ttl pk-yrs)   Types: Cigarettes   Start date: 10/1991   Quit date: 10/2020   Years since quitting: 3.6  Smokeless Tobacco Never    Goals Met:  Proper associated with RPD/PD & O2 Sat Independence with exercise equipment Using PLB without cueing & demonstrates good technique Exercise tolerated well No report of concerns or symptoms today Strength training completed today  Goals Unmet:  Not Applicable    Comments: Pt able to follow exercise prescription today without complaint.  Will continue to monitor for progression.    Dr. Oneil Pinal is Medical Director for [facility] Cardiac Rehabilitation.  Dr. Fuad Aleskerov is Medical Director for [facility] Pulmonary Rehabilitation.

## 2024-07-08 ENCOUNTER — Encounter: Admitting: Emergency Medicine

## 2024-07-08 DIAGNOSIS — J432 Centrilobular emphysema: Secondary | ICD-10-CM

## 2024-07-08 NOTE — Progress Notes (Signed)
 Daily Session Note  Patient Details  Name: Lauren Escobar MRN: [id number] Date of Birth: [birth year] Referring Provider:   Flowsheet Row Pulmonary Rehab from 04/28/2024 in [facility] Cardiac and Pulmonary Rehab  Referring Provider [name] MD    Encounter Date: 07/08/2024  Check In:  Session Check In - 07/08/24 1134       Check-In   Supervising physician immediately available to respond to emergencies See telemetry face sheet for immediately available ER MD    Location [facility]-Cardiac & Pulmonary Rehab    Staff Present Maxon Conetta BS, Exercise Physiologist;Joseph [facility] RCP,RRT,BSRT;Neveen Daponte RN,BSN;Noah Tickle, [place], Exercise Physiologist    Virtual Visit No    Medication changes reported     No    Fall or balance concerns reported    No    Tobacco Cessation No Change    Warm-up and Cool-down Performed on first and last piece of equipment    Resistance Training Performed Yes    VAD Patient? No    PAD/SET Patient? No      Pain Assessment   Currently in Pain? No/denies             Social History   Tobacco Use  Smoking Status Former   Current packs/day: 0.00   Average packs/day: 2.8 packs/day for 29.0 years (79.8 ttl pk-yrs)   Types: Cigarettes   Start date: 10/1991   Quit date: 10/2020   Years since quitting: 3.6  Smokeless Tobacco Never    Goals Met:  Proper associated with RPD/PD & O2 Sat Independence with exercise equipment Using PLB without cueing & demonstrates good technique Exercise tolerated well No report of concerns or symptoms today Strength training completed today  Goals Unmet:  Not Applicable  Comments: Pt able to follow exercise prescription today without complaint.  Will continue to monitor for progression.    Dr. Oneil Pinal is Medical Director for [facility] Cardiac Rehabilitation.  Dr. Fuad Aleskerov is Medical Director for [facility] Pulmonary Rehabilitation.

## 2024-07-13 ENCOUNTER — Encounter

## 2024-07-13 DIAGNOSIS — J432 Centrilobular emphysema: Secondary | ICD-10-CM

## 2024-07-13 NOTE — Progress Notes (Signed)
 Daily Session Note  Patient Details  Name: ELISSIA SPIEWAK MRN: [id number] Date of Birth: 1966/04/03 Referring Provider:   Flowsheet Row Pulmonary Rehab from 04/28/2024 in [facility] Cardiac and Pulmonary Rehab  Referring Provider [name] MD    Encounter Date: 07/13/2024  Check In:  Session Check In - 07/13/24 1044       Check-In   Supervising physician immediately available to respond to emergencies See telemetry face sheet for immediately available ER MD    Location [facility]-Cardiac & Pulmonary Rehab    Staff Present Burnard Davenport RN,BSN,MPA;Margaret Best, MS, Exercise Physiologist;Laureen Delores, BS, RRT, CPFT;Jason Elnor RDN,LDN    Virtual Visit No    Medication changes reported     No    Fall or balance concerns reported    No    Tobacco Cessation No Change    Warm-up and Cool-down Performed on first and last piece of equipment    Resistance Training Performed Yes    VAD Patient? No    PAD/SET Patient? No      Pain Assessment   Currently in Pain? No/denies             Social History   Tobacco Use  Smoking Status Former   Current packs/day: 0.00   Average packs/day: 2.8 packs/day for 29.0 years (79.8 ttl pk-yrs)   Types: Cigarettes   Start date: 10/1991   Quit date: 10/2020   Years since quitting: 3.7  Smokeless Tobacco Never    Goals Met:  Proper associated with RPD/PD & O2 Sat Independence with exercise equipment Using PLB without cueing & demonstrates good technique Exercise tolerated well No report of concerns or symptoms today Strength training completed today  Goals Unmet:  Not Applicable  Comments: Pt able to follow exercise prescription today without complaint.  Will continue to monitor for progression.    Dr. Oneil Pinal is Medical Director for [facility] Cardiac Rehabilitation.  Dr. Fuad Aleskerov is Medical Director for [facility] Pulmonary Rehabilitation.

## 2024-07-15 ENCOUNTER — Encounter: Admitting: Emergency Medicine

## 2024-07-15 DIAGNOSIS — J432 Centrilobular emphysema: Secondary | ICD-10-CM | POA: Diagnosis not present

## 2024-07-15 NOTE — Progress Notes (Signed)
 Daily Session Note  Patient Details  Name: Lauren Escobar MRN: [id number] Date of Birth: 05/27/1966 Referring Provider:   Flowsheet Row Pulmonary Rehab from 04/28/2024 in [facility] Cardiac and Pulmonary Rehab  Referring Provider [name] MD    Encounter Date: 07/15/2024  Check In:  Session Check In - 07/15/24 1220       Check-In   Supervising physician immediately available to respond to emergencies See telemetry face sheet for immediately available ER MD    Location [facility]-Cardiac & Pulmonary Rehab    Staff Present Selinda Pereyra RDN,LDN;Margaret Best, MS, Exercise Physiologist;Lacreasha Hinds RN,BSN;Joseph Rolinda RCP,RRT,BSRT    Virtual Visit No    Medication changes reported     No    Fall or balance concerns reported    No    Tobacco Cessation No Change    Warm-up and Cool-down Performed on first and last piece of equipment    Resistance Training Performed Yes    VAD Patient? No    PAD/SET Patient? No      Pain Assessment   Currently in Pain? No/denies             Social History   Tobacco Use  Smoking Status Former   Current packs/day: 0.00   Average packs/day: 2.8 packs/day for 29.0 years (79.8 ttl pk-yrs)   Types: Cigarettes   Start date: 10/1991   Quit date: 10/2020   Years since quitting: 3.7  Smokeless Tobacco Never    Goals Met:  Proper associated with RPD/PD & O2 Sat Independence with exercise equipment Using PLB without cueing & demonstrates good technique Exercise tolerated well No report of concerns or symptoms today Strength training completed today  Goals Unmet:  Not Applicable  Comments: Pt able to follow exercise prescription today without complaint.  Will continue to monitor for progression.    Dr. Oneil Pinal is Medical Director for [facility] Cardiac Rehabilitation.  Dr. Fuad Aleskerov is Medical Director for [facility] Pulmonary Rehabilitation.

## 2024-07-20 ENCOUNTER — Encounter

## 2024-07-20 DIAGNOSIS — J432 Centrilobular emphysema: Secondary | ICD-10-CM | POA: Diagnosis not present

## 2024-07-20 NOTE — Progress Notes (Signed)
 Daily Session Note  Patient Details  Name: Lauren Escobar MRN: [id number] Date of Birth: April 20, 1966 Referring Provider:   Flowsheet Row Pulmonary Rehab from 04/28/2024 in [facility] Cardiac and Pulmonary Rehab  Referring Provider [name] MD    Encounter Date: 07/20/2024  Check In:  Session Check In - 07/20/24 1114       Check-In   Supervising physician immediately available to respond to emergencies See telemetry face sheet for immediately available ER MD    Location [facility]-Cardiac & Pulmonary Rehab    Staff Present Burnard Davenport RN,BSN,MPA;Meredith Tressa RN,BSN;Mary Peggi, RN, DNP, NE-BC;Maxon Conetta BS, Exercise Physiologist    Virtual Visit No    Medication changes reported     No    Fall or balance concerns reported    No    Tobacco Cessation No Change    Warm-up and Cool-down Performed on first and last piece of equipment    Resistance Training Performed Yes    VAD Patient? No    PAD/SET Patient? No      Pain Assessment   Currently in Pain? No/denies             Social History   Tobacco Use  Smoking Status Former   Current packs/day: 0.00   Average packs/day: 2.8 packs/day for 29.0 years (79.8 ttl pk-yrs)   Types: Cigarettes   Start date: 10/1991   Quit date: 10/2020   Years since quitting: 3.7  Smokeless Tobacco Never    Goals Met:  Proper associated with RPD/PD & O2 Sat Independence with exercise equipment Using PLB without cueing & demonstrates good technique Exercise tolerated well No report of concerns or symptoms today Strength training completed today  Goals Unmet:  Not Applicable  Comments: Pt able to follow exercise prescription today without complaint.  Will continue to monitor for progression.    Dr. Oneil Pinal is Medical Director for [facility] Cardiac Rehabilitation.  Dr. Fuad Aleskerov is Medical Director for [facility] Pulmonary Rehabilitation.

## 2024-07-21 DIAGNOSIS — J432 Centrilobular emphysema: Secondary | ICD-10-CM

## 2024-07-21 DIAGNOSIS — R0602 Shortness of breath: Secondary | ICD-10-CM

## 2024-07-21 NOTE — Progress Notes (Signed)
 Pulmonary Individual Treatment Plan  Patient Details  Name: JAMESE TRAUGER MRN: [id number] Date of Birth: Sep 23, 1966 Referring Provider:   Flowsheet Row Pulmonary Rehab from 04/28/2024 in [facility] Cardiac and Pulmonary Rehab  Referring Provider [name] MD    Initial Encounter Date:  Flowsheet Row Pulmonary Rehab from 04/28/2024 in [facility] Cardiac and Pulmonary Rehab  Date 04/28/24    Visit Diagnosis: Centrilobular emphysema (HCC)  Shortness of breath  Patient's Home Medications on Admission:  Current Outpatient Medications:    albuterol  (PROVENTIL ) (2.5 MG/3ML) 0.083% nebulizer solution, Take 3 mLs (2.5 mg total) by nebulization every 6 (six) hours as needed for wheezing or shortness of breath. (Patient not taking: Reported on 04/12/2024), Disp: 75 mL, Rfl: 12   ARIPiprazole  (ABILIFY ) 2 MG tablet, Take 2 mg by mouth daily. (Patient not taking: Reported on 04/12/2024), Disp: , Rfl:    aspirin  EC 81 MG tablet, Take 81 mg by mouth daily., Disp: , Rfl:    atorvastatin  (LIPITOR) 20 MG tablet, Take 20 mg by mouth at bedtime., Disp: , Rfl:    benzonatate  (TESSALON ) 100 MG capsule, Take 1 capsule (100 mg total) by mouth 3 (three) times daily as needed for cough., Disp: 20 capsule, Rfl: 0   busPIRone  (BUSPAR ) 7.5 MG tablet, Take 7.5 mg by mouth 3 (three) times daily., Disp: , Rfl:    cetirizine (ZYRTEC) 10 MG tablet, Take 10 mg by mouth daily., Disp: , Rfl:    esomeprazole (NEXIUM) 40 MG capsule, Take 40 mg by mouth daily. , Disp: , Rfl:    Fluticasone -Umeclidin-Vilant (TRELEGY ELLIPTA ) 200-62.5-25 MCG/ACT AEPB, Inhale 1 puff into the lungs daily., Disp: 60 each, Rfl: 11   folic acid  (FOLVITE ) 1 MG tablet, Take 1 mg by mouth daily., Disp: , Rfl:    hydroxyurea  (HYDREA ) 500 MG capsule, Take 500 mg by mouth 2 (two) times daily. May take with food to minimize GI side effects., Disp: , Rfl:    ipratropium-albuterol  (DUONEB) 0.5-2.5 (3) MG/3ML SOLN, Take 3 mLs by nebulization every 6 (six) hours for  7 days., Disp: 90 mL, Rfl: 0   isosorbide  mononitrate (IMDUR ) 30 MG 24 hr tablet, Take 30 mg by mouth daily., Disp: , Rfl:    mirtazapine  (REMERON ) 15 MG tablet, Take 15 mg by mouth at bedtime., Disp: , Rfl:    Multiple Vitamins-Minerals (MULTIVIT/MULTIMINERAL ADULT PO), Take 1 tablet by mouth daily., Disp: , Rfl:    propranolol  (INDERAL ) 40 MG tablet, Take 40 mg by mouth 2 (two) times daily., Disp: , Rfl:   Past Medical History: Past Medical History:  Diagnosis Date   Abdominal pain    Abnormal mammogram    Anxiety    Asthma    Bleeding disorder    Blood dyscrasia    polycythemia vera   Chronic kidney disease    COPD (chronic obstructive pulmonary disease) (HCC)    Cough    Depression    Diabetes mellitus without complication (HCC)    Fatigue    Headache 2000   Horizontal nystagmus    age 58   Hyperglycemia    Hypertension    Low back pain    Major depressive disorder    Nasal lesion    Neck pain    Snoring    SOB (shortness of breath)    Tobacco use    UTI (urinary tract infection)    Von Willebrand's disease (HCC)     Tobacco Use: Social History   Tobacco Use  Smoking Status Former  Current packs/day: 0.00   Average packs/day: 2.8 packs/day for 29.0 years (79.8 ttl pk-yrs)   Types: Cigarettes   Start date: 10/1991   Quit date: 10/2020   Years since quitting: 3.7  Smokeless Tobacco Never    Labs: Review Flowsheet  More data exists      Latest Ref Rng & Units 01/01/2014 06/16/2017 10/15/2022 01/07/2024 04/12/2024  Labs for ITP Cardiac and Pulmonary Rehab  Cholestrol 0 - 200 mg/dL 890  - 824  - -  LDL (calc) 0 - 99 mg/dL 40  - 92  - -  HDL-C >59 mg/dL 34  - 49  - -  Trlycerides <150 mg/dL 824  - 827  - -  Hemoglobin A1c 4.8 - 5.6 % 5.5  5.5  6.1  - -  Bicarbonate 20.0 - 28.0 mmol/L - - 23.4  26.5  27.3   Acid-base deficit 0.0 - 2.0 mmol/L - - 0.7  - -  O2 Saturation % - - 84.1  58.3  38      Pulmonary Assessment Scores:  Pulmonary Assessment Scores      Row Name 04/28/24 1546         ADL UCSD   ADL Phase Entry     SOB Score total 78     Rest 3     Walk 4     Stairs 5     Bath 3     Dress 3     Shop 4       CAT Score   CAT Score 29       mMRC Score   mMRC Score 4        UCSD: Self-administered rating of dyspnea associated with activities of daily living (ADLs) 6-point scale (0 = not at all to 5 = maximal or unable to do because of breathlessness)  Scoring Scores range from 0 to 120.  Minimally important difference is 5 units  CAT: CAT can identify the health impairment of COPD patients and is better correlated with disease progression.  CAT has a scoring range of zero to 40. The CAT score is classified into four groups of low (less than 10), medium (10 - 20), high (21-30) and very high (31-40) based on the impact level of disease on health status. A CAT score over 10 suggests significant symptoms.  A worsening CAT score could be explained by an exacerbation, poor medication adherence, poor inhaler technique, or progression of COPD or comorbid conditions.  CAT MCID is 2 points  mMRC: mMRC (Modified Medical Research Council) Dyspnea Scale is used to assess the degree of baseline functional disability in patients of respiratory disease due to dyspnea. No minimal important difference is established. A decrease in score of 1 point or greater is considered a positive change.   Pulmonary Function Assessment:   Exercise Target Goals: Exercise Program Goal: Individual exercise prescription set using results from initial 6 min walk test and THRR while considering  patient's activity barriers and safety.   Exercise Prescription Goal: Initial exercise prescription builds to 30-45 minutes a day of aerobic activity, 2-3 days per week.  Home exercise guidelines will be given to patient during program as part of exercise prescription that the participant will acknowledge.  Education: Aerobic Exercise: - Group verbal and visual  presentation on the components of exercise prescription. Introduces F.I.T.T principle from ACSM for exercise prescriptions.  Reviews F.I.T.T. principles of aerobic exercise including progression. Written material provided at class time.   Education: Resistance Exercise: -  Group verbal and visual presentation on the components of exercise prescription. Introduces F.I.T.T principle from ACSM for exercise prescriptions  Reviews F.I.T.T. principles of resistance exercise including progression. Written material provided at class time. Flowsheet Row Pulmonary Rehab from 12/25/2022 in [facility] Cardiac and Pulmonary Rehab  Date 11/20/22  Educator [facility]  Instruction Review Code 1- Verbalizes Understanding     Education: Exercise & Equipment Safety: - Individual verbal instruction and demonstration of equipment use and safety with use of the equipment. Flowsheet Row Pulmonary Rehab from 04/28/2024 in [facility] Cardiac and Pulmonary Rehab  Date 04/28/24  Educator MB  Instruction Review Code 1- Verbalizes Understanding    Education: Exercise Physiology & General Exercise Guidelines: - Group verbal and written instruction with models to review the exercise physiology of the cardiovascular system and associated critical values. Provides general exercise guidelines with specific guidelines to those with heart or lung disease.    Education: Flexibility, Balance, Mind/Body Relaxation: - Group verbal and visual presentation with interactive activity on the components of exercise prescription. Introduces F.I.T.T principle from ACSM for exercise prescriptions. Reviews F.I.T.T. principles of flexibility and balance exercise training including progression. Also discusses the mind body connection.  Reviews various relaxation techniques to help reduce and manage stress (i.e. Deep breathing, progressive muscle relaxation, and visualization). Balance handout provided to take home. Written material provided at class time. Flowsheet  Row Pulmonary Rehab from 12/25/2022 in [facility] Cardiac and Pulmonary Rehab  Date 11/20/22  Educator [facility]  Instruction Review Code 1- Verbalizes Understanding    Activity Barriers & Risk Stratification:  Activity Barriers & Cardiac Risk Stratification - 04/28/24 1542       Activity Barriers & Cardiac Risk Stratification   Activity Barriers Left Knee Replacement;Back Problems;Shortness of Breath;Balance Concerns          6 Minute Walk:  6 Minute Walk     Row Name 04/28/24 1538         6 Minute Walk   Phase Initial     Distance 1170 feet     Walk Time 6 minutes     # of Rest Breaks 0     MPH 2.2     METS 3.13     RPE 19     Perceived Dyspnea  2     Symptoms Yes (comment)     Comments Chest pain 9/10 and resolved to a 7/10 at rest     Resting HR 93 bpm     Resting BP 100/70     Resting Oxygen Saturation  96 %     Exercise Oxygen Saturation  during 6 min walk 91 %     Max Ex. HR 117 bpm     Max Ex. BP 120/78     2 Minute Post BP 92/70       Interval HR   1 Minute HR 100     2 Minute HR 117     3 Minute HR 112     4 Minute HR 110     5 Minute HR 87     6 Minute HR 95     2 Minute Post HR 91     Interval Heart Rate? Yes       Interval Oxygen   Interval Oxygen? Yes     Baseline Oxygen Saturation % 96 %     1 Minute Oxygen Saturation % 91 %     1 Minute Liters of Oxygen 0 L     2 Minute Oxygen Saturation %  91 %     2 Minute Liters of Oxygen 0 L     3 Minute Oxygen Saturation % 94 %     3 Minute Liters of Oxygen 0 L     4 Minute Oxygen Saturation % 94 %     4 Minute Liters of Oxygen 0 L     5 Minute Oxygen Saturation % 94 %     5 Minute Liters of Oxygen 0 L     6 Minute Oxygen Saturation % 93 %     6 Minute Liters of Oxygen 0 L     2 Minute Post Oxygen Saturation % 98 %     2 Minute Post Liters of Oxygen 0 L       Oxygen Initial Assessment:  Oxygen Initial Assessment - 04/27/24 1304       Home Oxygen   Home Oxygen Device None    Sleep Oxygen Prescription  None    Home Exercise Oxygen Prescription None    Home Resting Oxygen Prescription None    Compliance with Home Oxygen Use Yes      Intervention   Short Term Goals To learn and understand importance of maintaining oxygen saturations>88%;To learn and understand importance of monitoring SPO2 with pulse oximeter and demonstrate accurate use of the pulse oximeter.;To learn and demonstrate proper pursed lip breathing techniques or other breathing techniques. ;To learn and demonstrate proper use of respiratory medications    Long  Term Goals Verbalizes importance of monitoring SPO2 with pulse oximeter and return demonstration;Maintenance of O2 saturations>88%;Exhibits proper breathing techniques, such as pursed lip breathing or other method taught during program session;Compliance with respiratory medication;Demonstrates proper use of MDI's          Oxygen Re-Evaluation:  Oxygen Re-Evaluation     Row Name 05/04/24 1056 07/08/24 1129           Program Oxygen Prescription   Program Oxygen Prescription None None        Home Oxygen   Home Oxygen Device None None      Sleep Oxygen Prescription None None      Home Exercise Oxygen Prescription None None      Home Resting Oxygen Prescription None None      Compliance with Home Oxygen Use Yes Yes        Goals/Expected Outcomes   Short Term Goals To learn and understand importance of maintaining oxygen saturations>88%;To learn and understand importance of monitoring SPO2 with pulse oximeter and demonstrate accurate use of the pulse oximeter.;To learn and demonstrate proper pursed lip breathing techniques or other breathing techniques. ;To learn and demonstrate proper use of respiratory medications To learn and understand importance of maintaining oxygen saturations>88%;To learn and understand importance of monitoring SPO2 with pulse oximeter and demonstrate accurate use of the pulse oximeter.;To learn and demonstrate proper pursed lip breathing  techniques or other breathing techniques. ;To learn and demonstrate proper use of respiratory medications      Long  Term Goals Verbalizes importance of monitoring SPO2 with pulse oximeter and return demonstration;Maintenance of O2 saturations>88%;Exhibits proper breathing techniques, such as pursed lip breathing or other method taught during program session;Compliance with respiratory medication;Demonstrates proper use of MDI's Verbalizes importance of monitoring SPO2 with pulse oximeter and return demonstration;Maintenance of O2 saturations>88%;Exhibits proper breathing techniques, such as pursed lip breathing or other method taught during program session;Compliance with respiratory medication;Demonstrates proper use of MDI's      Comments Reviewed PLB technique with pt.  Talked about  how it works and it's importance in maintaining their exercise saturations. We reviewed PLB technique with pt. She has been practicing her PLB technique while exercising. She states that it is hardest for her to breath when she begins to walk faster. We talked about how PLB works and it's importance in maintaining her exercise saturations.      Goals/Expected Outcomes Short: Become more profiecient at using PLB. Long: Become independent at using PLB. Short: Continue to practice PLB with exercise. Long: Become independent at using PLB.         Oxygen Discharge (Final Oxygen Re-Evaluation):  Oxygen Re-Evaluation - 07/08/24 1129       Program Oxygen Prescription   Program Oxygen Prescription None      Home Oxygen   Home Oxygen Device None    Sleep Oxygen Prescription None    Home Exercise Oxygen Prescription None    Home Resting Oxygen Prescription None    Compliance with Home Oxygen Use Yes      Goals/Expected Outcomes   Short Term Goals To learn and understand importance of maintaining oxygen saturations>88%;To learn and understand importance of monitoring SPO2 with pulse oximeter and demonstrate accurate use of  the pulse oximeter.;To learn and demonstrate proper pursed lip breathing techniques or other breathing techniques. ;To learn and demonstrate proper use of respiratory medications    Long  Term Goals Verbalizes importance of monitoring SPO2 with pulse oximeter and return demonstration;Maintenance of O2 saturations>88%;Exhibits proper breathing techniques, such as pursed lip breathing or other method taught during program session;Compliance with respiratory medication;Demonstrates proper use of MDI's    Comments We reviewed PLB technique with pt. She has been practicing her PLB technique while exercising. She states that it is hardest for her to breath when she begins to walk faster. We talked about how PLB works and it's importance in maintaining her exercise saturations.    Goals/Expected Outcomes Short: Continue to practice PLB with exercise. Long: Become independent at using PLB.          Initial Exercise Prescription:  Initial Exercise Prescription - 04/28/24 1500       Date of Initial Exercise RX and Referring Provider   Date 04/28/24    Referring Provider [name] MD      Oxygen   Maintain Oxygen Saturation 88% or higher      Recumbant Bike   Level 2    RPM 50    Watts 25    Minutes 15    METs 3.13      NuStep   Level 2    SPM 80    Minutes 15    METs 3.13      REL-XR   Level 1    Speed 50    Minutes 15    METs 3.13      T5 Nustep   Level 2    SPM 80    Minutes 15    METs 3.13      Track   Laps 26    Minutes 15    METs 2.41      Prescription Details   Frequency (times per week) 2    Duration Progress to 30 minutes of continuous aerobic without signs/symptoms of physical distress      Intensity   THRR 40-80% of Max Heartrate 117-147    Ratings of Perceived Exertion 11-13    Perceived Dyspnea 0-4      Progression   Progression Continue to progress workloads to maintain intensity without  signs/symptoms of physical distress.      Resistance  Training   Training Prescription Yes    Weight 4 lb    Reps 10-15          Perform Capillary Blood Glucose checks as needed.  Exercise Prescription Changes:   Exercise Prescription Changes     Row Name 04/28/24 1500 05/18/24 1400 05/31/24 1600 06/17/24 0900 06/29/24 1000     Response to Exercise   Blood Pressure (Admit) 100/70 124/68 132/68 96/52 96/60    Blood Pressure (Exercise) 120/78 120/60 126/68 130/70 --   Blood Pressure (Exit) 92/70 118/62 102/60 118/60 104/54   Heart Rate (Admit) 93 bpm 90 bpm 107 bpm 103 bpm 85 bpm   Heart Rate (Exercise) 117 bpm 98 bpm 106 bpm 108 bpm 97 bpm   Heart Rate (Exit) 88 bpm 89 bpm 89 bpm 92 bpm 82 bpm   Oxygen Saturation (Admit) 96 % 95 % 97 % 95 % 96 %   Oxygen Saturation (Exercise) 91 % 92 % 96 % 91 % 95 %   Oxygen Saturation (Exit) 98 % 93 % 98 % 97 % 96 %   Rating of Perceived Exertion (Exercise) 19 14 15 14 15    Perceived Dyspnea (Exercise) 2 1 2 2 2    Symptoms chest pain 9/10, resolved to 7/10 with rest none none none none   Comments results 1st 2 weeks of exercise sessions -- -- --   Duration -- Progress to 30 minutes of  aerobic without signs/symptoms of physical distress Continue with 30 min of aerobic exercise without signs/symptoms of physical distress. Continue with 30 min of aerobic exercise without signs/symptoms of physical distress. Continue with 30 min of aerobic exercise without signs/symptoms of physical distress.   Intensity THRR New THRR unchanged THRR unchanged THRR unchanged THRR unchanged     Progression   Progression -- Continue to progress workloads to maintain intensity without signs/symptoms of physical distress. Continue to progress workloads to maintain intensity without signs/symptoms of physical distress. Continue to progress workloads to maintain intensity without signs/symptoms of physical distress. Continue to progress workloads to maintain intensity without signs/symptoms of physical distress.   Average  METs 3.13 2.37 2.78 2.52 2.55     Resistance Training   Training Prescription -- Yes Yes Yes Yes   Weight -- 4 lb 4 lb 4 lb 4 lb   Reps -- 10-15 10-15 10-15 10-15     Interval Training   Interval Training -- No No No No     Treadmill   MPH -- -- -- 1.5 1.6   Grade -- -- -- -- 0   Minutes -- -- -- 15 15   METs -- -- -- 2.15 2.23     NuStep   Level -- 2 3 2  --   Minutes -- 15 15 15  --   METs -- 2.2 2.4 2 --     REL-XR   Level -- 2 2 4 5    Minutes -- 15 15 15 15    METs -- 2.7 3.7 -- 3.5     Track   Laps -- 30 30 17 17    Minutes -- 15 15 15 15    METs -- 2.63 2.63 1.92 1.92     Oxygen   Maintain Oxygen Saturation -- 88% or higher 88% or higher 88% or higher 88% or higher    Row Name 07/13/24 1100 07/15/24 0800           Response to Exercise  Blood Pressure (Admit) -- 94/56      Blood Pressure (Exit) -- 112/60      Heart Rate (Admit) -- 80 bpm      Heart Rate (Exercise) -- 93 bpm      Heart Rate (Exit) -- 85 bpm      Oxygen Saturation (Admit) -- 97 %      Oxygen Saturation (Exercise) -- 96 %      Oxygen Saturation (Exit) -- 97 %      Rating of Perceived Exertion (Exercise) -- 14      Perceived Dyspnea (Exercise) -- 2      Symptoms -- none      Duration -- Continue with 30 min of aerobic exercise without signs/symptoms of physical distress.      Intensity -- THRR unchanged        Progression   Progression -- Continue to progress workloads to maintain intensity without signs/symptoms of physical distress.      Average METs -- 2.28        Resistance Training   Training Prescription -- Yes      Weight -- 4 lb      Reps -- 10-15        Interval Training   Interval Training -- No        Treadmill   MPH -- 2.1      Grade -- 0      Minutes -- 15      METs -- 2.61        REL-XR   Level -- 4      Minutes -- 15        Home Exercise Plan   Plans to continue exercise at Home (comment)  Walking at home on days away from rehab Home (comment)  Walking at home  on days away from rehab      Frequency Add 1 additional day to program exercise sessions. Add 1 additional day to program exercise sessions.      Initial Home Exercises Provided 07/13/24 07/13/24        Oxygen   Maintain Oxygen Saturation 88% or higher 88% or higher         Exercise Comments:   Exercise Comments     Row Name 05/04/24 1053           Exercise Comments First full day of exercise!  Patient was oriented to gym and equipment including functions, settings, policies, and procedures.  Patient's individual exercise prescription and treatment plan were reviewed.  All starting workloads were established based on the results of the 6 minute walk test done at initial orientation visit.  The plan for exercise progression was also introduced and progression will be customized based on patient's performance and goals.          Exercise Goals and Review:   Exercise Goals     Row Name 04/28/24 1545             Exercise Goals   Increase Physical Activity Yes       Intervention Provide advice, education, support and counseling about physical activity/exercise needs.;Develop an individualized exercise prescription for aerobic and resistive training based on initial evaluation findings, risk stratification, comorbidities and participant's personal goals.       Expected Outcomes Long Term: Exercising regularly at least 3-5 days a week.;Long Term: Add in home exercise to make exercise part of routine and to increase amount of physical activity.;Short Term: Attend rehab on a regular basis to  increase amount of physical activity.       Increase Strength and Stamina Yes       Intervention Provide advice, education, support and counseling about physical activity/exercise needs.;Develop an individualized exercise prescription for aerobic and resistive training based on initial evaluation findings, risk stratification, comorbidities and participant's personal goals.       Expected Outcomes  Short Term: Increase workloads from initial exercise prescription for resistance, speed, and METs.;Short Term: Perform resistance training exercises routinely during rehab and add in resistance training at home;Long Term: Improve cardiorespiratory fitness, muscular endurance and strength as measured by increased METs and functional capacity ( )       Able to understand and use rate of perceived exertion (RPE) scale Yes       Intervention Provide education and explanation on how to use RPE scale       Expected Outcomes Short Term: Able to use RPE daily in rehab to express subjective intensity level;Long Term:  Able to use RPE to guide intensity level when exercising independently       Able to understand and use Dyspnea scale Yes       Intervention Provide education and explanation on how to use Dyspnea scale       Expected Outcomes Short Term: Able to use Dyspnea scale daily in rehab to express subjective sense of shortness of breath during exertion;Long Term: Able to use Dyspnea scale to guide intensity level when exercising independently       Knowledge and understanding of Target Heart Rate Range (THRR) Yes       Intervention Provide education and explanation of THRR including how the numbers were predicted and where they are located for reference       Expected Outcomes Short Term: Able to state/look up THRR;Short Term: Able to use daily as guideline for intensity in rehab;Long Term: Able to use THRR to govern intensity when exercising independently       Able to check pulse independently Yes       Intervention Provide education and demonstration on how to check pulse in carotid and radial arteries.;Review the importance of being able to check your own pulse for safety during independent exercise       Expected Outcomes Short Term: Able to explain why pulse checking is important during independent exercise;Long Term: Able to check pulse independently and accurately       Understanding of Exercise  Prescription Yes       Intervention Provide education, explanation, and written materials on patient's individual exercise prescription       Expected Outcomes Short Term: Able to explain program exercise prescription;Long Term: Able to explain home exercise prescription to exercise independently          Exercise Goals Re-Evaluation :  Exercise Goals Re-Evaluation     Row Name 05/04/24 1053 05/18/24 1444 05/31/24 1618 06/17/24 0923 06/29/24 1017     Exercise Goal Re-Evaluation   Exercise Goals Review Increase Physical Activity;Able to understand and use rate of perceived exertion (RPE) scale;Knowledge and understanding of Target Heart Rate Range (THRR);Understanding of Exercise Prescription;Able to understand and use Dyspnea scale;Able to check pulse independently;Increase Strength and Stamina Increase Physical Activity;Understanding of Exercise Prescription;Increase Strength and Stamina Increase Physical Activity;Understanding of Exercise Prescription;Increase Strength and Stamina Increase Physical Activity;Understanding of Exercise Prescription;Increase Strength and Stamina Increase Physical Activity;Understanding of Exercise Prescription;Increase Strength and Stamina   Comments Reviewed RPE and dyspnea scale, THR and program prescription with pt today.  Pt voiced understanding and  was given a copy of goals to take home. Kanoe is off to a good start in the program and completed her first 2 weeks of exercise in this review. She tolerated her exercise prescription well. She worked at level 2 on the T4 nustep and XR. She walked 30 laps on the track. We will continue to monitor her progress in the program. Denasia is doing well in rehab. She continues to walk 30 laps on the track. She also recently improved to level 3 on the T4 nustep. We will continue to monitor her progress in the program. Janat is doing well in rehab. She was recently able to increase from level 2 to 4 on the XR. She was also able to  add the treadmill to her current exercise prescription at a speed of 1.5 and no incline. We will continue to monitor her progress in the program. Illyria is doing well in rehab. She was recently able to increase her speed on the treadmill to 1.6 mph with no incline. She also improved to level 5 on the XR. We will continue to monitor her progress in the program.   Expected Outcomes Short: Use RPE daily to regulate intensity. Long: Follow program prescription in THR. Short: Continue to follow current exercise prescription. Long: Continue exercise to improve strength and stamina. Short: Continue to progressively increase workloads. Long: Continue exercise to improve strength and stamina. Short: Continue to follow exercise prescription, and increase workloads when able. Long: Continue exercise to improve strength and stamina. Short: Continue to progressively increase workloads. Long: Continue exercise to improve strength and stamina.    Row Name 07/13/24 1136 07/15/24 0816           Exercise Goal Re-Evaluation   Exercise Goals Review Increase Physical Activity;Understanding of Exercise Prescription;Increase Strength and Stamina;Able to understand and use Dyspnea scale;Knowledge and understanding of Target Heart Rate Range (THRR);Able to check pulse independently;Able to understand and use rate of perceived exertion (RPE) scale Increase Physical Activity;Understanding of Exercise Prescription;Increase Strength and Stamina      Comments Reviewed home exercise with pt today from 1125 to 1134.  Pt plans to walk at home for exercise on days away from rehab.  Reviewed THR, pulse, RPE, sign and symptoms, pulse oximetery and when to call 911 or MD.  Also discussed weather considerations and indoor options.  Pt voiced understanding. Liat is doing well in rehab. She increased her workload on the treadmill to a speed of 2.1 mph and no incline. She worked at level 4 on the XR. We will continue to monitor her progress in  the program.      Expected Outcomes Short: Begin walking at home on days away from rehab. Long: Continue to exercise independently. Short: Continue to progressively increase treadmill and XR workloads. Long: Continue exercise to improve strength and stamina.         Discharge Exercise Prescription (Final Exercise Prescription Changes):  Exercise Prescription Changes - 07/15/24 0800       Response to Exercise   Blood Pressure (Admit) 94/56    Blood Pressure (Exit) 112/60    Heart Rate (Admit) 80 bpm    Heart Rate (Exercise) 93 bpm    Heart Rate (Exit) 85 bpm    Oxygen Saturation (Admit) 97 %    Oxygen Saturation (Exercise) 96 %    Oxygen Saturation (Exit) 97 %    Rating of Perceived Exertion (Exercise) 14    Perceived Dyspnea (Exercise) 2    Symptoms none  Duration Continue with 30 min of aerobic exercise without signs/symptoms of physical distress.    Intensity THRR unchanged      Progression   Progression Continue to progress workloads to maintain intensity without signs/symptoms of physical distress.    Average METs 2.28      Resistance Training   Training Prescription Yes    Weight 4 lb    Reps 10-15      Interval Training   Interval Training No      Treadmill   MPH 2.1    Grade 0    Minutes 15    METs 2.61      REL-XR   Level 4    Minutes 15      Home Exercise Plan   Plans to continue exercise at Home (comment)   Walking at home on days away from rehab   Frequency Add 1 additional day to program exercise sessions.    Initial Home Exercises Provided 07/13/24      Oxygen   Maintain Oxygen Saturation 88% or higher          Nutrition:  Target Goals: Understanding of nutrition guidelines, daily intake of sodium 1500mg , cholesterol 200mg , calories 30% from fat and 7% or less from saturated fats, daily to have 5 or more servings of fruits and vegetables.  Education: Nutrition 1 -Group instruction provided by verbal, written material, interactive  activities, discussions, models, and posters to present general guidelines for heart healthy nutrition including macronutrients, label reading, and promoting whole foods over processed counterparts. Education serves as Pensions consultant of discussion of heart healthy eating for all. Written material provided at class time.     Education: Nutrition 2 -Group instruction provided by verbal, written material, interactive activities, discussions, models, and posters to present general guidelines for heart healthy nutrition including sodium, cholesterol, and saturated fat. Providing guidance of habit forming to improve blood pressure, cholesterol, and body weight. Written material provided at class time.     Biometrics:  Pre Biometrics - 04/28/24 1546       Pre Biometrics   Height 5' 1.5 (1.562 m)    Weight 165 lb 12.8 oz (75.2 kg)    Waist Circumference 42.5 inches    Hip Circumference 42 inches    Waist to Hip Ratio 1.01 %    BMI (Calculated) 30.82    Single Leg Stand 1 seconds           Nutrition Therapy Plan and Nutrition Goals:  Nutrition Therapy & Goals - 04/28/24 1512       Nutrition Therapy   Diet Mediterranean    Protein (specify units) 70-90    Fiber 25 grams    Whole Grain Foods 3 servings    Saturated Fats 15 max. grams    Fruits and Vegetables 5 servings/day    Sodium 2 grams      Personal Nutrition Goals   Nutrition Goal Eat 3 times per day, small frequent meals or nutrient dense snacks (use protein shake if needed)    Personal Goal #2 Eat 15-30gProtein and 30-60gCarbs at each meal.    Personal Goal #3 Drink 16-32oz of water daily    Comments Patient doesn't drink much water, mostly just met dew, about 16oz daily. She doesn't eat breakfast or lunch. Usually just dinner. Spoke with her about the importance of eating more throughout the day. She was not sure she would eat more than she already was. Suggested a protein shake as a good starting point. Breaking it  into two  5.5oz halves if needed. Suggested pairing it with a carb, like fruit or crackers. Provided mediterranean diet handout. Kept goals simple to eat more and drink more water.      Intervention Plan   Intervention Prescribe, educate and counsel regarding individualized specific dietary modifications aiming towards targeted core components such as weight, hypertension, lipid management, diabetes, heart failure and other comorbidities.;Nutrition handout(s) given to patient.    Expected Outcomes Long Term Goal: Adherence to prescribed nutrition plan.;Short Term Goal: A plan has been developed with personal nutrition goals set during dietitian appointment.;Short Term Goal: Understand basic principles of dietary content, such as calories, fat, sodium, cholesterol and nutrients.          Nutrition Assessments:  MEDIFICTS Score Key: >=70 Need to make dietary changes  40-70 Heart Healthy Diet <= 40 Therapeutic Level Cholesterol Diet  Flowsheet Row Pulmonary Rehab from 04/28/2024 in [facility] Cardiac and Pulmonary Rehab  Picture Your Plate Total Score on Admission 52   Picture Your Plate Scores: <59 Unhealthy dietary pattern with much room for improvement. 41-50 Dietary pattern unlikely to meet recommendations for good health and room for improvement. 51-60 More healthful dietary pattern, with some room for improvement.  >60 Healthy dietary pattern, although there may be some specific behaviors that could be improved.   Nutrition Goals Re-Evaluation:  Nutrition Goals Re-Evaluation     Row Name 07/08/24 1108             Goals   Comment Tyrone states that she still does not have much of an appetite and is only eating one meal a day. We encouraged her to try and add at least one more nutrient dense snack or meal so she can get enough nutrients. She states that her one meal a day comes at dinner time and usually is a crockpot dinner that includes meat and one vegetable.       Expected Outcome Short: Try  adding one more nutrient dense snack each day. Long: Continue to practice heart healthy eating patterns discussed with RD.          Nutrition Goals Discharge (Final Nutrition Goals Re-Evaluation):  Nutrition Goals Re-Evaluation - 07/08/24 1108       Goals   Comment Kandy states that she still does not have much of an appetite and is only eating one meal a day. We encouraged her to try and add at least one more nutrient dense snack or meal so she can get enough nutrients. She states that her one meal a day comes at dinner time and usually is a crockpot dinner that includes meat and one vegetable.    Expected Outcome Short: Try adding one more nutrient dense snack each day. Long: Continue to practice heart healthy eating patterns discussed with RD.          Psychosocial: Target Goals: Acknowledge presence or absence of significant depression and/or stress, maximize coping skills, provide positive support system. Participant is able to verbalize types and ability to use techniques and skills needed for reducing stress and depression.   Education: Stress, Anxiety, and Depression - Group verbal and visual presentation to define topics covered.  Reviews how body is impacted by stress, anxiety, and depression.  Also discusses healthy ways to reduce stress and to treat/manage anxiety and depression.  Written material provided at class time. Flowsheet Row Pulmonary Rehab from 12/25/2022 in [facility] Cardiac and Pulmonary Rehab  Date 10/30/22  Educator [facility]  Instruction Review Code 1- Verbalizes Understanding  Education: Sleep Hygiene -Provides group verbal and written instruction about how sleep can affect your health.  Define sleep hygiene, discuss sleep cycles and impact of sleep habits. Review good sleep hygiene tips.    Initial Review & Psychosocial Screening:  Initial Psych Review & Screening - 04/27/24 1328       Initial Review   Current issues with None Identified      Family Dynamics    Good Support System? Yes      Barriers   Psychosocial barriers to participate in program There are no identifiable barriers or psychosocial needs.      Screening Interventions   Interventions Encouraged to exercise;To provide support and resources with identified psychosocial needs;Provide feedback about the scores to participant    Expected Outcomes Short Term goal: Utilizing psychosocial counselor, staff and physician to assist with identification of specific Stressors or current issues interfering with healing process. Setting desired goal for each stressor or current issue identified.;Long Term Goal: Stressors or current issues are controlled or eliminated.;Short Term goal: Identification and review with participant of any Quality of Life or Depression concerns found by scoring the questionnaire.;Long Term goal: The participant improves quality of Life and PHQ9 Scores as seen by post scores and/or verbalization of changes          Quality of Life Scores:  Scores of 19 and below usually indicate a poorer quality of life in these areas.  A difference of  2-3 points is a clinically meaningful difference.  A difference of 2-3 points in the total score of the Quality of Life Index has been associated with significant improvement in overall quality of life, self-image, physical symptoms, and general health in studies assessing change in quality of life.  PHQ-9: Review Flowsheet  More data exists      07/08/2024 04/28/2024 12/30/2022 12/04/2022 11/01/2022  Depression screen PHQ 2/9  Decreased Interest 3 3 3  0 3  Down, Depressed, Hopeless 0 3 3 3 3   PHQ - 2 Score 3 6 6 3 6   Altered sleeping 3 3 3 3 3   Tired, decreased energy 3 3 3 3 3   Change in appetite 3 3 3 3 3   Feeling bad or failure about yourself  3 3 3 3 3   Trouble concentrating 3 3 3  0 3  Moving slowly or fidgety/restless 3 3 3 3 3   Suicidal thoughts 0 0 0 0 0  PHQ-9 Score 21 24 24 18 24   Difficult doing work/chores Extremely  dIfficult Very difficult Very difficult Extremely dIfficult Extremely dIfficult   Interpretation of Total Score  Total Score Depression Severity:  1-4 = Minimal depression, 5-9 = Mild depression, 10-14 = Moderate depression, 15-19 = Moderately severe depression, 20-27 = Severe depression   Psychosocial Evaluation and Intervention:  Psychosocial Evaluation - 04/27/24 1329       Psychosocial Evaluation & Interventions   Interventions Encouraged to exercise with the program and follow exercise prescription    Comments Tashianna is coming to pulmonary rehab with Emphysema. She has done the program before but was unable to complete it. She states she could tell that it made a difference before, so she is willing to try again to help her quality of life. Her medications are going well. She mentions needed a sleep study and is waiting for that to be scheduled. She states she doesn't have any concerns and just takes it day by day with her health.    Expected Outcomes Short: attend pulmonary rehab for education and  exercise Long: develop and maintain positive self care habits    Continue Psychosocial Services  Follow up required by staff          Psychosocial Re-Evaluation:  Psychosocial Re-Evaluation     Row Name 07/08/24 1118             Psychosocial Re-Evaluation   Current issues with Current Depression;Current Anxiety/Panic;Current Sleep Concerns;Current Stress Concerns       Comments Sharlette states that the majority of her stress comes from her health concerns and taking care of her family. She reports her stress has caused her not to be able to sleep very well and caused a loss of appetite. We reviewed her PHQ-9 questionnaire today and her score improved some but was still high. She states that she does not really have hobbies to help her relieve stress but does have her faith in the [place].       Expected Outcomes Short: Talk to doctor about sleep and anxiety. Long: Continue to exercise for  mental boost.       Interventions Encouraged to attend Pulmonary Rehabilitation for the exercise       Continue Psychosocial Services  Follow up required by staff          Psychosocial Discharge (Final Psychosocial Re-Evaluation):  Psychosocial Re-Evaluation - 07/08/24 1118       Psychosocial Re-Evaluation   Current issues with Current Depression;Current Anxiety/Panic;Current Sleep Concerns;Current Stress Concerns    Comments Ivonna states that the majority of her stress comes from her health concerns and taking care of her family. She reports her stress has caused her not to be able to sleep very well and caused a loss of appetite. We reviewed her PHQ-9 questionnaire today and her score improved some but was still high. She states that she does not really have hobbies to help her relieve stress but does have her faith in the [place].    Expected Outcomes Short: Talk to doctor about sleep and anxiety. Long: Continue to exercise for mental boost.    Interventions Encouraged to attend Pulmonary Rehabilitation for the exercise    Continue Psychosocial Services  Follow up required by staff          Education: Education Goals: Education classes will be provided on a weekly basis, covering required topics. Participant will state understanding/return demonstration of topics presented.  Learning Barriers/Preferences:  Learning Barriers/Preferences - 04/27/24 1307       Learning Barriers/Preferences   Learning Barriers None    Learning Preferences None          General Pulmonary Education Topics:  Infection Prevention: - Provides verbal and written material to individual with discussion of infection control including proper hand washing and proper equipment cleaning during exercise session. Flowsheet Row Pulmonary Rehab from 04/28/2024 in [facility] Cardiac and Pulmonary Rehab  Date 04/28/24  Educator MB  Instruction Review Code 1- Verbalizes Understanding    Falls Prevention: - Provides  verbal and written material to individual with discussion of falls prevention and safety. Flowsheet Row Pulmonary Rehab from 04/28/2024 in [facility] Cardiac and Pulmonary Rehab  Date 04/28/24  Educator MB  Instruction Review Code 1- Verbalizes Understanding    Chronic Lung Disease Review: - Group verbal instruction with posters, models, PowerPoint presentations and videos,  to review new updates, new respiratory medications, new advancements in procedures and treatments. Providing information on websites and 800 numbers for continued self-education. Includes information about supplement oxygen, available portable oxygen systems, continuous and intermittent flow rates, oxygen  safety, concentrators, and Medicare reimbursement for oxygen. Explanation of Pulmonary Drugs, including class, frequency, complications, importance of spacers, rinsing mouth after steroid MDI's, and proper cleaning methods for nebulizers. Review of basic lung anatomy and physiology related to function, structure, and complications of lung disease. Review of risk factors. Discussion about methods for diagnosing sleep apnea and types of masks and machines for OSA. Includes a review of the use of types of environmental controls: home humidity, furnaces, filters, dust mite/pet prevention, HEPA vacuums. Discussion about weather changes, air quality and the benefits of nasal washing. Instruction on Warning signs, infection symptoms, calling MD promptly, preventive modes, and value of vaccinations. Review of effective airway clearance, coughing and/or vibration techniques. Emphasizing that all should Create an Action Plan. Written material provided at class time. Flowsheet Row Pulmonary Rehab from 12/25/2022 in [facility] Cardiac and Pulmonary Rehab  Education need identified 09/30/22    AED/CPR: - Group verbal and written instruction with the use of models to demonstrate the basic use of the AED with the basic ABC's of resuscitation.    Tests  and Procedures:  - Group verbal and visual presentation and models provide information about basic cardiac anatomy and function. Reviews the testing methods done to diagnose heart disease and the outcomes of the test results. Describes the treatment choices: Medical Management, Angioplasty, or Coronary Bypass Surgery for treating various heart conditions including Myocardial Infarction, Angina, Valve Disease, and Cardiac Arrhythmias.  Written material provided at class time. Flowsheet Row Pulmonary Rehab from 12/25/2022 in [facility] Cardiac and Pulmonary Rehab  Date 12/04/22  Educator SB  Instruction Review Code 1- Verbalizes Understanding    Medication Safety: - Group verbal and visual instruction to review commonly prescribed medications for heart and lung disease. Reviews the medication, class of the drug, and side effects. Includes the steps to properly store meds and maintain the prescription regimen.  Written material given at graduation. Flowsheet Row Pulmonary Rehab from 12/25/2022 in [facility] Cardiac and Pulmonary Rehab  Date 10/09/22  Educator SB  Instruction Review Code 1- Verbalizes Understanding    Other: -Provides group and verbal instruction on various topics (see comments)   Knowledge Questionnaire Score:  Knowledge Questionnaire Score - 05/11/24 1137       Knowledge Questionnaire Score   Pre Score 16/18           Core Components/Risk Factors/Patient Goals at Admission:  Personal Goals and Risk Factors at Admission - 04/28/24 1548       Core Components/Risk Factors/Patient Goals on Admission    Weight Management Yes;Weight Loss    Intervention Weight Management: Develop a combined nutrition and exercise program designed to reach desired caloric intake, while maintaining appropriate intake of nutrient and fiber, sodium and fats, and appropriate energy expenditure required for the weight goal.;Weight Management: Provide education and appropriate resources to help participant  work on and attain dietary goals.;Weight Management/Obesity: Establish reasonable short term and long term weight goals.    Admit Weight 165 lb 12.8 oz (75.2 kg)    Goal Weight: Short Term 147 lb 8 oz (66.9 kg)    Goal Weight: Long Term 130 lb (59 kg)    Expected Outcomes Short Term: Continue to assess and modify interventions until short term weight is achieved;Long Term: Adherence to nutrition and physical activity/exercise program aimed toward attainment of established weight goal;Weight Loss: Understanding of general recommendations for a balanced deficit meal plan, which promotes 1-2 lb weight loss per week and includes a negative energy balance of [phone] kcal/d;Understanding recommendations  for meals to include 15-35% energy as protein, 25-35% energy from fat, 35-60% energy from carbohydrates, less than 200mg  of dietary cholesterol, 20-35 gm of total fiber daily;Understanding of distribution of calorie intake throughout the day with the consumption of 4-5 meals/snacks    Improve shortness of breath with ADL's Yes    Intervention Provide education, individualized exercise plan and daily activity instruction to help decrease symptoms of SOB with activities of daily living.    Expected Outcomes Short Term: Improve cardiorespiratory fitness to achieve a reduction of symptoms when performing ADLs;Long Term: Be able to perform more ADLs without symptoms or delay the onset of symptoms    Increase knowledge of respiratory medications and ability to use respiratory devices properly  Yes    Intervention Provide education and demonstration as needed of appropriate use of medications, inhalers, and oxygen therapy.    Expected Outcomes Short Term: Achieves understanding of medications use. Understands that oxygen is a medication prescribed by physician. Demonstrates appropriate use of inhaler and oxygen therapy.;Long Term: Maintain appropriate use of medications, inhalers, and oxygen therapy.    Hypertension  Yes    Intervention Provide education on lifestyle modifcations including regular physical activity/exercise, weight management, moderate sodium restriction and increased consumption of fresh fruit, vegetables, and low fat dairy, alcohol moderation, and smoking cessation.;Monitor prescription use compliance.    Expected Outcomes Short Term: Continued assessment and intervention until BP is < 140/4mm HG in hypertensive participants. < 130/8mm HG in hypertensive participants with diabetes, heart failure or chronic kidney disease.;Long Term: Maintenance of blood pressure at goal levels.          Education:Diabetes - Individual verbal and written instruction to review signs/symptoms of diabetes, desired ranges of glucose level fasting, after meals and with exercise. Acknowledge that pre and post exercise glucose checks will be done for 3 sessions at entry of program. Flowsheet Row Pulmonary Rehab from 12/25/2022 in [facility] Cardiac and Pulmonary Rehab  Date 09/30/22  Educator [facility]  Instruction Review Code 1- Verbalizes Understanding    Know Your Numbers and Heart Failure: - Group verbal and visual instruction to discuss disease risk factors for cardiac and pulmonary disease and treatment options.  Reviews associated critical values for Overweight/Obesity, Hypertension, Cholesterol, and Diabetes.  Discusses basics of heart failure: signs/symptoms and treatments.  Introduces Heart Failure Zone chart for action plan for heart failure. Written material provided at class time. Flowsheet Row Pulmonary Rehab from 12/25/2022 in [facility] Cardiac and Pulmonary Rehab  Date 12/25/22  Educator [facility]  Instruction Review Code 1- Verbalizes Understanding    Core Components/Risk Factors/Patient Goals Review:   Goals and Risk Factor Review     Row Name 07/08/24 1112             Core Components/Risk Factors/Patient Goals Review   Personal Goals Review Weight Management/Obesity;Improve shortness of breath with  ADL's;Hypertension       Review Payton states that she would like to lose some weight with a goal of getting down to 115 lb. She weighed in today at 162.7 lb. She is only eating one meal a day and does not understand why she is not losing weight. She states that she was able to breath better when she was previously at a lower weight. She has been checking her BP at home with her personal BP cuff but reports that her readings have been low. She states that her systolic readings are usually in the 90's and her diastolic readings have been in the 50's. We encouraged  her to speak with her doctor about her low BP readings. She reports continuing to take all of her medications as prescribed.       Expected Outcomes Short: Talk to doctor about low BP readings. Long: Conitnue to manage lifestyle risk factors.          Core Components/Risk Factors/Patient Goals at Discharge (Final Review):   Goals and Risk Factor Review - 07/08/24 1112       Core Components/Risk Factors/Patient Goals Review   Personal Goals Review Weight Management/Obesity;Improve shortness of breath with ADL's;Hypertension    Review Yenni states that she would like to lose some weight with a goal of getting down to 115 lb. She weighed in today at 162.7 lb. She is only eating one meal a day and does not understand why she is not losing weight. She states that she was able to breath better when she was previously at a lower weight. She has been checking her BP at home with her personal BP cuff but reports that her readings have been low. She states that her systolic readings are usually in the 90's and her diastolic readings have been in the 50's. We encouraged her to speak with her doctor about her low BP readings. She reports continuing to take all of her medications as prescribed.    Expected Outcomes Short: Talk to doctor about low BP readings. Long: Conitnue to manage lifestyle risk factors.          ITP Comments:  ITP Comments      Row Name 04/27/24 1326 04/28/24 1537 05/04/24 1053 05/26/24 0942 06/23/24 0941   ITP Comments Initial phone call completed. Diagnosis can be found in [facility] 6/12. EP Orientation scheduled for Wednesday 7/2 at 1:30. Completed and gym orientation for respiratory care services. Initial ITP created and sent for review to Dr. Faud Aleskerov, Medical Director. First full day of exercise!  Patient was oriented to gym and equipment including functions, settings, policies, and procedures.  Patient's individual exercise prescription and treatment plan were reviewed.  All starting workloads were established based on the results of the 6 minute walk test done at initial orientation visit.  The plan for exercise progression was also introduced and progression will be customized based on patient's performance and goals. 30 Day review completed. Medical Director ITP review done, changes made as directed, and signed approval by Medical Director. new to program. 30 Day review completed. Medical Director ITP review done; changes made as directed and signed approval by Medical Director.    Row Name 07/21/24 1406           ITP Comments 30 Day review completed. Medical Director ITP review done; changes made as directed and signed approval by Medical Director.          Comments: 30 day review

## 2024-07-22 ENCOUNTER — Encounter: Admitting: Emergency Medicine

## 2024-07-22 DIAGNOSIS — J432 Centrilobular emphysema: Secondary | ICD-10-CM | POA: Diagnosis not present

## 2024-07-22 NOTE — Progress Notes (Signed)
 Daily Session Note  Patient Details  Name: Lauren Escobar MRN: [id number] Date of Birth: 10-Jul-1966 Referring Provider:   Flowsheet Row Pulmonary Rehab from 04/28/2024 in [facility] Cardiac and Pulmonary Rehab  Referring Provider [name] MD    Encounter Date: 07/22/2024  Check In:  Session Check In - 07/22/24 1111       Check-In   Supervising physician immediately available to respond to emergencies See telemetry face sheet for immediately available ER MD    Location [facility]-Cardiac & Pulmonary Rehab    Staff Present Devaughn Jaeger, BS, Exercise Physiologist;Caliph Borowiak RN,BSN;Joseph Rolinda RCP,RRT,BSRT;Jason Elnor RDN,LDN    Virtual Visit No    Medication changes reported     No    Fall or balance concerns reported    No    Tobacco Cessation No Change    Warm-up and Cool-down Performed on first and last piece of equipment    Resistance Training Performed Yes    VAD Patient? No    PAD/SET Patient? No      Pain Assessment   Currently in Pain? No/denies             Social History   Tobacco Use  Smoking Status Former   Current packs/day: 0.00   Average packs/day: 2.8 packs/day for 29.0 years (79.8 ttl pk-yrs)   Types: Cigarettes   Start date: 10/1991   Quit date: 10/2020   Years since quitting: 3.7  Smokeless Tobacco Never    Goals Met:  Proper associated with RPD/PD & O2 Sat Independence with exercise equipment Using PLB without cueing & demonstrates good technique Exercise tolerated well No report of concerns or symptoms today Strength training completed today  Goals Unmet:  Not Applicable  Comments: Pt able to follow exercise prescription today without complaint.  Will continue to monitor for progression.    Dr. Oneil Pinal is Medical Director for [facility] Cardiac Rehabilitation.  Dr. Fuad Aleskerov is Medical Director for [facility] Pulmonary Rehabilitation.

## 2024-07-26 ENCOUNTER — Ambulatory Visit (INDEPENDENT_AMBULATORY_CARE_PROVIDER_SITE_OTHER): Admitting: Student in an Organized Health Care Education/Training Program

## 2024-07-26 ENCOUNTER — Encounter: Payer: Self-pay | Admitting: Student in an Organized Health Care Education/Training Program

## 2024-07-26 VITALS — BP 116/84 | HR 91 | Temp 97.9°F | Ht 61.5 in | Wt 163.0 lb

## 2024-07-26 DIAGNOSIS — J439 Emphysema, unspecified: Secondary | ICD-10-CM | POA: Diagnosis not present

## 2024-07-26 DIAGNOSIS — J42 Unspecified chronic bronchitis: Secondary | ICD-10-CM | POA: Diagnosis not present

## 2024-07-26 DIAGNOSIS — F17200 Nicotine dependence, unspecified, uncomplicated: Secondary | ICD-10-CM

## 2024-07-26 DIAGNOSIS — G4733 Obstructive sleep apnea (adult) (pediatric): Secondary | ICD-10-CM

## 2024-07-26 DIAGNOSIS — Z87891 Personal history of nicotine dependence: Secondary | ICD-10-CM | POA: Diagnosis not present

## 2024-07-26 NOTE — Patient Instructions (Signed)
 Please schedule a visit with our sleep specialist, Dr. Jess for a sleep apnea evaluation.  Please follow up with me in 3 to 4 months after you've had repeat breathing test, and after you've had your low dose CT of your chest and your echocardiogram.

## 2024-07-26 NOTE — Progress Notes (Signed)
 Assessment & Plan:   #PRISM  #Chronic bronchitis #Emphysema  She presents for follow-up today with shortness of breath with previous pulmonary function testing from September 2023 showing obstruction with FEV1 of 1.5 L (68% predicted) and a ratio of 0.86 which is overall consistent with prism .  Her lung volumes were within normal but RV/TLC was elevated at 142% predicted consistent with air trapping.  DLCO was at the lower limit of normal.  Previous imaging from [facility] had reported emphysema. Peak eosinophil count was 800 in 2014 and I will continue to entertain asthma/copd overlap on the differential diagnosis.  This is consistent with prism  we are treating her with triple therapy ICS/LABA/LAMA.  She has been switched to Trelegy which she is tolerating quite well and is compliant with.  She did enroll in pulmonary rehab which has helped with her symptoms. She was unable to get her echocardiogram nor her low-dose CT for lung cancer screening.  Today, I will continue with Trelegy and reorder her echocardiogram and low-dose CT.  I will also order repeat pulmonary function testing to monitor her lung function.  - Pulmonary Function Test; Future - Continue Trelegy Ellipta  1 puff once daily - Reschedule echocardiogram - Reschedule low-dose CT  #OSA  She did have a sleep study that was consistent with sleep apnea.  Discussed with the patient whether she would be interested in starting CPAP therapy which she is.  I will refer her to sleep medicine for further evaluation and initiation of NIPPV.  -Schedule appointment with sleep medicine  #Tobacco use disorder  History of tobacco use, quit in 2022 with a significant smoking history. Will switch her lung cancer screening to our program given she's not had her scan for two years.   - Reschedule low-dose CT  Return in about 4 months (around 11/25/2024).  I spent 32 minutes caring for this patient today, including preparing to see the patient,  obtaining a medical history , reviewing a separately obtained history, performing a medically appropriate examination and/or evaluation, counseling and educating the patient/family/caregiver, ordering medications, tests, or procedures, documenting clinical information in the electronic health record, and independently interpreting results (not separately reported/billed) and communicating results to the patient/family/caregiver  Lauren November, MD [facility] Pulmonary Critical [facility]  End of visit medications:  No orders of the defined types were placed in this encounter.    Current Outpatient Medications:    albuterol  (PROVENTIL ) (2.5 MG/3ML) 0.083% nebulizer solution, Take 3 mLs (2.5 mg total) by nebulization every 6 (six) hours as needed for wheezing or shortness of breath., Disp: 75 mL, Rfl: 12   aspirin  EC 81 MG tablet, Take 81 mg by mouth daily., Disp: , Rfl:    atorvastatin  (LIPITOR) 20 MG tablet, Take 20 mg by mouth at bedtime., Disp: , Rfl:    benzonatate  (TESSALON ) 100 MG capsule, Take 1 capsule (100 mg total) by mouth 3 (three) times daily as needed for cough., Disp: 20 capsule, Rfl: 0   busPIRone  (BUSPAR ) 7.5 MG tablet, Take 7.5 mg by mouth 3 (three) times daily., Disp: , Rfl:    cetirizine (ZYRTEC) 10 MG tablet, Take 10 mg by mouth daily., Disp: , Rfl:    esomeprazole (NEXIUM) 40 MG capsule, Take 40 mg by mouth daily. , Disp: , Rfl:    Fluticasone -Umeclidin-Vilant (TRELEGY ELLIPTA ) 200-62.5-25 MCG/ACT AEPB, Inhale 1 puff into the lungs daily., Disp: 60 each, Rfl: 11   folic acid  (FOLVITE ) 1 MG tablet, Take 1 mg by mouth daily., Disp: , Rfl:  hydroxyurea  (HYDREA ) 500 MG capsule, Take 500 mg by mouth 2 (two) times daily. May take with food to minimize GI side effects., Disp: , Rfl:    ipratropium-albuterol  (DUONEB) 0.5-2.5 (3) MG/3ML SOLN, Take 3 mLs by nebulization every 6 (six) hours for 7 days., Disp: 90 mL, Rfl: 0   isosorbide  mononitrate (IMDUR ) 30 MG 24 hr tablet, Take 30 mg by  mouth daily., Disp: , Rfl:    mirtazapine  (REMERON ) 15 MG tablet, Take 15 mg by mouth at bedtime., Disp: , Rfl:    Multiple Vitamins-Minerals (MULTIVIT/MULTIMINERAL ADULT PO), Take 1 tablet by mouth daily., Disp: , Rfl:    propranolol  (INDERAL ) 40 MG tablet, Take 40 mg by mouth 2 (two) times daily., Disp: , Rfl:    ARIPiprazole  (ABILIFY ) 2 MG tablet, Take 2 mg by mouth daily. (Patient not taking: Reported on 07/26/2024), Disp: , Rfl:    Subjective:   PATIENT ID: Lauren Escobar, Lauren Escobar  Chief Complaint  Patient presents with   Medical Management of Chronic Issues    Cough and shortness of breath.     HPI  58 year old female with a history of bronchitis presenting for follow up.  Return Visit 04/08/2024:   She's had a recent COPD exacerbation since our last visit, treated with antibiotics and steroids. She was admitted between 01/07/2024 and 01/10/2024 for said COPD exacerbation. She continues to experience residual dyspnea as well as cough. She was confused with her inhaler therapy, and has only used the Spiriva  and has not used her Breo Ellipta  recently. She is also reporting feeling tired in the morning, multiple nocturnal awakenings, and snoring. She is concerned for sleep apnea.   Prior to this, she's been followed with us  in clinic for symptoms of exertional dyspnea and cough for which we prescribed Breo Ellipta  and Spiriva . The prior exacerbation was in July of 2024.   She's not on any new medications, and denies any alcohol or illicit drug use. She has no fevers, no stiffness, and no flushing. There has been no change to symptoms otherwise.   She denies any fevers, chills, hemoptysis, productive sputum, night sweats, weight loss, abdominal pain, chest pain, or chest tightness.  She has gained weight over the past couple of years (between 15 and 20 pounds) and this has coincided with worsening symptoms.  Her other providers include hematology at  [facility] where she is managed for polycythemia vera.  She was getting yearly CT scans for lung cancer screening at [facility].  Review of the record does note that the CT mentions emphysema but otherwise normal lung parenchyma.  Return Visit 07/26/2024:   She continues to experience respiratory symptoms, with shortness of breath most notable. She reports a minimal wheeze and cough. She did have a sore throat last week that has since resolved. She is compliant with her trelegy and is using her albuterol  and nebulizers.She was unable to get her TTE nor her low dose CT.  She used to work at [employer] but has not done so for many years and she is currently on disability.  She used to smoke (3 packs a day for at least 20 years) but quit a couple of years ago after her friend came down with cancer. She continues to abstain from cigarettes.  Ancillary information including prior medications, full medical/surgical/family/social histories, and PFTs (when available) are listed below and have been reviewed.    Review of Systems  Constitutional:  Negative for chills, fever and weight loss (  weight gain).  Respiratory:  Positive for cough and shortness of breath. Negative for hemoptysis, sputum production and wheezing.   Cardiovascular:  Negative for chest pain.  Neurological:  Negative for tremors.     Objective:   Vitals:   07/26/24 1016  BP: 116/84  Pulse: 91  Temp: 97.9 F (36.6 C)  TempSrc: Temporal  SpO2: 99%  Weight: 163 lb (73.9 kg)  Height: 5' 1.5 (1.562 m)   99% on RA BMI Readings from Last 3 Encounters:  07/26/24 30.30 kg/m  04/28/24 30.82 kg/m  04/13/24 32.64 kg/m   Wt Readings from Last 3 Encounters:  07/26/24 163 lb (73.9 kg)  04/28/24 165 lb 12.8 oz (75.2 kg)  04/13/24 161 lb 9.6 oz (73.3 kg)    Physical Exam Constitutional:      Appearance: Normal appearance.  HENT:     Head: Normocephalic.     Mouth/Throat:     Comments: Crowded oropharynx Eyes:     Extraocular Movements:  Extraocular movements intact.     Pupils: Pupils are equal, round, and reactive to light.  Neck:     Comments: Large neck circumferance Cardiovascular:     Rate and Rhythm: Normal rate and regular rhythm.     Pulses: Normal pulses.     Heart sounds: Normal heart sounds.  Pulmonary:     Effort: Pulmonary effort is normal.     Breath sounds: Normal breath sounds. No wheezing.  Abdominal:     Palpations: Abdomen is soft.  Neurological:     General: No focal deficit present.     Mental Status: She is alert and oriented to person, place, and time. Mental status is at baseline.       Ancillary Information    Past Medical History:  Diagnosis Date   Abdominal pain    Abnormal mammogram    Anxiety    Asthma    Bleeding disorder    Blood dyscrasia    polycythemia vera   Chronic kidney disease    COPD (chronic obstructive pulmonary disease) (HCC)    Cough    Depression    Diabetes mellitus without complication (HCC)    Fatigue    Headache 2000   Horizontal nystagmus    age 40   Hyperglycemia    Hypertension    Low back pain    Major depressive disorder    Nasal lesion    Neck pain    Snoring    SOB (shortness of breath)    Tobacco use    UTI (urinary tract infection)    Von Willebrand's disease (HCC)      Family History  Problem Relation Age of Onset   Cancer Father        prostate   Stroke Father    Diabetes Father    Cancer Mother        bladder   Breast cancer Maternal Grandmother      Past Surgical History:  Procedure Laterality Date   ABDOMINAL HYSTERECTOMY  2002   oophorectomy   ABDOMINAL HYSTERECTOMY     APPENDECTOMY     BLADDER SURGERY     BREAST BIOPSY Left    NEG   CARDIAC CATHETERIZATION     CARDIAC CATHETERIZATION N/A 07/17/2016   Procedure: Left Heart Cath and Coronary Angiography;  Surgeon: Wolm JINNY Rhyme, MD;  Location: [facility] INVASIVE CV LAB;  Service: Cardiovascular;  Laterality: N/A;   COLONOSCOPY WITH PROPOFOL  N/A 07/27/2015    Procedure: COLONOSCOPY WITH PROPOFOL ;  Surgeon:  Lamar ONEIDA Holmes, MD;  Location: [facility] ENDOSCOPY;  Service: Endoscopy;  Laterality: N/A;   COLONOSCOPY WITH PROPOFOL  N/A 05/31/2019   Procedure: COLONOSCOPY WITH PROPOFOL ;  Surgeon: Unk Corinn Skiff, MD;  Location: [facility] ENDOSCOPY;  Service: Gastroenterology;  Laterality: N/A;   ESOPHAGOGASTRODUODENOSCOPY N/A 06/18/2017   Procedure: ESOPHAGOGASTRODUODENOSCOPY (EGD);  Surgeon: Unk Corinn Skiff, MD;  Location: [facility] ENDOSCOPY;  Service: Gastroenterology;  Laterality: N/A;   ESOPHAGOGASTRODUODENOSCOPY (EGD) WITH PROPOFOL  N/A 05/31/2019   Procedure: ESOPHAGOGASTRODUODENOSCOPY (EGD) WITH PROPOFOL ;  Surgeon: Unk Corinn Skiff, MD;  Location: [facility] ENDOSCOPY;  Service: Gastroenterology;  Laterality: N/A;   REPLACEMENT TOTAL KNEE Left    SHOULDER SURGERY Left    x 2    Social History   Socioeconomic History   Marital status: Single    Spouse name: Not on file   Number of children: Not on file   Years of education: Not on file   Highest education level: Not on file  Occupational History    Comment: disabled  Tobacco Use   Smoking status: Former    Current packs/day: 0.00    Average packs/day: 2.8 packs/day for 29.0 years (79.8 ttl pk-yrs)    Types: Cigarettes    Start date: 10/1991    Quit date: 10/2020    Years since quitting: 3.7   Smokeless tobacco: Never  Vaping Use   Vaping status: Never Used  Substance and Sexual Activity   Alcohol use: No    Alcohol/week: 0.0 standard drinks of alcohol   Drug use: No   Sexual activity: Not on file  Other Topics Concern   Not on file  Social History Narrative   single   Social Drivers of Health   Financial Resource Strain: Not on file  Food Insecurity: No Food Insecurity (04/13/2024)   Hunger Vital Sign    Worried About Running Out of Food in the Last Year: Never true    Ran Out of Food in the Last Year: Never true  Transportation Needs: No Transportation Needs (04/13/2024)   PRAPARE -  Administrator, Civil Service (Medical): No    Lack of Transportation (Non-Medical): No  Physical Activity: Not on file  Stress: Not on file  Social Connections: Moderately Isolated (04/13/2024)   Social Connection and Isolation Panel    Frequency of Communication with Friends and Family: Twice a week    Frequency of Social Gatherings with Friends and Family: Never    Attends Religious Services: 1 to 4 times per year    Active Member of [employer] or Organizations: No    Attends Banker Meetings: Never    Marital Status: Married  Catering manager Violence: Not At Risk (04/13/2024)   Humiliation, Afraid, Rape, and Kick questionnaire    Fear of Current or Ex-Partner: No    Emotionally Abused: No    Physically Abused: No    Sexually Abused: No     Allergies  Allergen Reactions   Aspirin      Able to tolerate low dose   Pregabalin Other (See Comments)    Unsteady gate, causing falls   Trazodone Other (See Comments)    weakness   Tramadol Rash     CBC    Component Value Date/Time   WBC 7.5 04/13/2024 0520   RBC 3.52 (L) 04/13/2024 0520   HGB 12.3 04/13/2024 0520   HGB 11.5 (L) 02/22/2015 0851   HCT 35.9 (L) 04/13/2024 0520   HCT 35.9 02/22/2015 0851   PLT 252 04/13/2024 0520  PLT 454 (H) 02/22/2015 0851   MCV 102.0 (H) 04/13/2024 0520   MCV 63 (L) 02/22/2015 0851   MCH 34.9 (H) 04/13/2024 0520   MCHC 34.3 04/13/2024 0520   RDW 18.1 (H) 04/13/2024 0520   RDW 18.6 (H) 02/22/2015 0851   LYMPHSABS 0.6 (L) 10/15/2022 0531   LYMPHSABS 2.0 02/22/2015 0851   MONOABS 0.6 10/15/2022 0531   MONOABS 0.4 02/22/2015 0851   EOSABS 0.1 10/15/2022 0531   EOSABS 0.4 02/22/2015 0851   BASOSABS 0.1 10/15/2022 0531   BASOSABS 0.1 02/22/2015 0851    Pulmonary Functions Testing Results:    Latest Ref Rng & Units 07/25/2022    2:02 PM  PFT Results  FVC-Pre L 1.58   FVC-Predicted Pre % 56   FVC-Post L 1.83   FVC-Predicted Post % 64   Pre FEV1/FVC % % 86   Post  FEV1/FCV % % 82   FEV1-Pre L 1.35   FEV1-Predicted Pre % 61   FEV1-Post L 1.50   DLCO uncorrected ml/min/mmHg 14.12   DLCO UNC% % 81   DLVA Predicted % 93   TLC L 4.81   TLC % Predicted % 111   RV % Predicted % 142     Outpatient Medications Prior to Visit  Medication Sig Dispense Refill   albuterol  (PROVENTIL ) (2.5 MG/3ML) 0.083% nebulizer solution Take 3 mLs (2.5 mg total) by nebulization every 6 (six) hours as needed for wheezing or shortness of breath. 75 mL 12   aspirin  EC 81 MG tablet Take 81 mg by mouth daily.     atorvastatin  (LIPITOR) 20 MG tablet Take 20 mg by mouth at bedtime.     benzonatate  (TESSALON ) 100 MG capsule Take 1 capsule (100 mg total) by mouth 3 (three) times daily as needed for cough. 20 capsule 0   busPIRone  (BUSPAR ) 7.5 MG tablet Take 7.5 mg by mouth 3 (three) times daily.     cetirizine (ZYRTEC) 10 MG tablet Take 10 mg by mouth daily.     esomeprazole (NEXIUM) 40 MG capsule Take 40 mg by mouth daily.      Fluticasone -Umeclidin-Vilant (TRELEGY ELLIPTA ) 200-62.5-25 MCG/ACT AEPB Inhale 1 puff into the lungs daily. 60 each 11   folic acid  (FOLVITE ) 1 MG tablet Take 1 mg by mouth daily.     hydroxyurea  (HYDREA ) 500 MG capsule Take 500 mg by mouth 2 (two) times daily. May take with food to minimize GI side effects.     ipratropium-albuterol  (DUONEB) 0.5-2.5 (3) MG/3ML SOLN Take 3 mLs by nebulization every 6 (six) hours for 7 days. 90 mL 0   isosorbide  mononitrate (IMDUR ) 30 MG 24 hr tablet Take 30 mg by mouth daily.     mirtazapine  (REMERON ) 15 MG tablet Take 15 mg by mouth at bedtime.     Multiple Vitamins-Minerals (MULTIVIT/MULTIMINERAL ADULT PO) Take 1 tablet by mouth daily.     propranolol  (INDERAL ) 40 MG tablet Take 40 mg by mouth 2 (two) times daily.     ARIPiprazole  (ABILIFY ) 2 MG tablet Take 2 mg by mouth daily. (Patient not taking: Reported on 07/26/2024)     No facility-administered medications prior to visit.

## 2024-07-27 ENCOUNTER — Encounter

## 2024-07-27 DIAGNOSIS — J432 Centrilobular emphysema: Secondary | ICD-10-CM

## 2024-07-27 NOTE — Progress Notes (Signed)
 Daily Session Note  Patient Details  Name: Lauren Escobar MRN: [id number] Date of Birth: Jan 15, 1966 Referring Provider:   Flowsheet Row Pulmonary Rehab from 04/28/2024 in [facility] Cardiac and Pulmonary Rehab  Referring Provider [name] MD    Encounter Date: 07/27/2024  Check In:  Session Check In - 07/27/24 1103       Check-In   Supervising physician immediately available to respond to emergencies See telemetry face sheet for immediately available ER MD    Location [facility]-Cardiac & Pulmonary Rehab    Staff Present Burnard Davenport RN,BSN,MPA;Maxon Conetta BS, Exercise Physiologist;Noah Tickle, BS, Exercise Physiologist;Meredith Tressa RN,BSN;Jason Elnor RDN,LDN    Virtual Visit No    Medication changes reported     No    Fall or balance concerns reported    No    Tobacco Cessation No Change    Warm-up and Cool-down Performed on first and last piece of equipment    Resistance Training Performed Yes    VAD Patient? No    PAD/SET Patient? No      Pain Assessment   Currently in Pain? No/denies             Social History   Tobacco Use  Smoking Status Former   Current packs/day: 0.00   Average packs/day: 2.8 packs/day for 29.0 years (79.8 ttl pk-yrs)   Types: Cigarettes   Start date: 10/1991   Quit date: 10/2020   Years since quitting: 3.7  Smokeless Tobacco Never    Goals Met:  Proper associated with RPD/PD & O2 Sat Independence with exercise equipment Using PLB without cueing & demonstrates good technique Exercise tolerated well No report of concerns or symptoms today Strength training completed today  Goals Unmet:  Not Applicable  Comments: Pt able to follow exercise prescription today without complaint.  Will continue to monitor for progression.    Dr. Oneil Pinal is Medical Director for [facility] Cardiac Rehabilitation.  Dr. Fuad Aleskerov is Medical Director for [facility] Pulmonary Rehabilitation.

## 2024-07-29 ENCOUNTER — Encounter: Attending: Student in an Organized Health Care Education/Training Program | Admitting: Emergency Medicine

## 2024-07-29 DIAGNOSIS — J432 Centrilobular emphysema: Secondary | ICD-10-CM | POA: Insufficient documentation

## 2024-07-29 MED FILL — HYDROXYUREA 500 MG CAPSULE: ORAL | 30 days supply | Qty: 60 | Fill #4

## 2024-07-29 NOTE — Progress Notes (Signed)
 Daily Session Note  Patient Details  Name: Lauren Escobar MRN: [id number] Date of Birth: [birth year] Referring Provider:   Flowsheet Row Pulmonary Rehab from 04/28/2024 in [facility] Cardiac and Pulmonary Rehab  Referring Provider [name] MD    Encounter Date: 07/29/2024  Check In:  Session Check In - 07/29/24 1131       Check-In   Supervising physician immediately available to respond to emergencies See telemetry face sheet for immediately available ER MD    Location [facility]-Cardiac & Pulmonary Rehab    Staff Present Rollene Paterson, MS, Exercise Physiologist;Jase Himmelberger RN,BSN;Joseph [facility] BS, Exercise Physiologist    Virtual Visit No    Medication changes reported     No    Fall or balance concerns reported    No    Tobacco Cessation No Change    Warm-up and Cool-down Performed on first and last piece of equipment    Resistance Training Performed Yes    VAD Patient? No    PAD/SET Patient? No      Pain Assessment   Currently in Pain? No/denies             Social History   Tobacco Use  Smoking Status Former   Current packs/day: 0.00   Average packs/day: 2.8 packs/day for 29.0 years (79.8 ttl pk-yrs)   Types: Cigarettes   Start date: 10/1991   Quit date: 10/2020   Years since quitting: 3.7  Smokeless Tobacco Never    Goals Met:  Proper associated with RPD/PD & O2 Sat Independence with exercise equipment Using PLB without cueing & demonstrates good technique Exercise tolerated well No report of concerns or symptoms today Strength training completed today  Goals Unmet:  Not Applicable  Comments: Pt able to follow exercise prescription today without complaint.  Will continue to monitor for progression.    Dr. Oneil Pinal is Medical Director for [facility] Cardiac Rehabilitation.  Dr. Fuad Aleskerov is Medical Director for [facility] Pulmonary Rehabilitation.

## 2024-08-03 ENCOUNTER — Encounter

## 2024-08-03 ENCOUNTER — Ambulatory Visit
Admit: 2024-08-03 | Discharge: 2024-08-03 | Payer: Medicare (Managed Care) | Attending: Hematology & Oncology | Primary: Hematology & Oncology

## 2024-08-03 ENCOUNTER — Other Ambulatory Visit: Admit: 2024-08-03 | Discharge: 2024-08-03 | Payer: Medicare (Managed Care)

## 2024-08-03 DIAGNOSIS — R0602 Shortness of breath: Principal | ICD-10-CM

## 2024-08-03 DIAGNOSIS — E611 Iron deficiency: Principal | ICD-10-CM

## 2024-08-03 DIAGNOSIS — D45 Polycythemia vera: Principal | ICD-10-CM

## 2024-08-03 MED ORDER — HYDROXYUREA 500 MG CAPSULE
ORAL_CAPSULE | Freq: Every day | ORAL | 11 refills | 30.00000 days | Status: CP
Start: 2024-08-03 — End: 2025-07-29
  Filled 2024-08-24: qty 60, 30d supply, fill #0

## 2024-08-04 ENCOUNTER — Ambulatory Visit
Admission: RE | Admit: 2024-08-04 | Discharge: 2024-08-04 | Disposition: A | Discharge status: Home / Self Care | Source: Ambulatory Visit | Attending: Family Medicine | Admitting: Family Medicine

## 2024-08-04 DIAGNOSIS — Z122 Encounter for screening for malignant neoplasm of respiratory organs: Secondary | ICD-10-CM | POA: Insufficient documentation

## 2024-08-04 DIAGNOSIS — Z87891 Personal history of nicotine dependence: Secondary | ICD-10-CM | POA: Insufficient documentation

## 2024-08-05 ENCOUNTER — Encounter: Admitting: Emergency Medicine

## 2024-08-05 DIAGNOSIS — J432 Centrilobular emphysema: Secondary | ICD-10-CM | POA: Diagnosis not present

## 2024-08-05 NOTE — Progress Notes (Signed)
 Daily Session Note  Patient Details  Name: Lauren Escobar MRN: [id number] Date of Birth: 10-08-1966 Referring Provider:   Flowsheet Row Pulmonary Rehab from 04/28/2024 in [facility] Cardiac and Pulmonary Rehab  Referring Provider [name] MD    Encounter Date: 08/05/2024  Check In:  Session Check In - 08/05/24 1112       Check-In   Supervising physician immediately available to respond to emergencies See telemetry face sheet for immediately available ER MD    Location [facility]-Cardiac & Pulmonary Rehab    Staff Present Leita Franks RN,BSN;Joseph [facility] BS, Exercise Physiologist;Jason Elnor RDN,LDN    Virtual Visit No    Medication changes reported     No    Fall or balance concerns reported    No    Tobacco Cessation No Change    Warm-up and Cool-down Performed on first and last piece of equipment    Resistance Training Performed Yes    VAD Patient? No    PAD/SET Patient? No      Pain Assessment   Currently in Pain? No/denies             Social History   Tobacco Use  Smoking Status Former   Current packs/day: 0.00   Average packs/day: 2.8 packs/day for 29.0 years (79.8 ttl pk-yrs)   Types: Cigarettes   Start date: 10/1991   Quit date: 10/2020   Years since quitting: 3.7  Smokeless Tobacco Never    Goals Met:  Proper associated with RPD/PD & O2 Sat Independence with exercise equipment Using PLB without cueing & demonstrates good technique Exercise tolerated well No report of concerns or symptoms today Strength training completed today  Goals Unmet:  Not Applicable  Comments: Pt able to follow exercise prescription today without complaint.  Will continue to monitor for progression.    Dr. Oneil Pinal is Medical Director for [facility] Cardiac Rehabilitation.  Dr. Fuad Aleskerov is Medical Director for [facility] Pulmonary Rehabilitation.

## 2024-08-06 ENCOUNTER — Telehealth: Payer: Self-pay | Admitting: Acute Care

## 2024-08-06 ENCOUNTER — Other Ambulatory Visit: Payer: Self-pay

## 2024-08-06 DIAGNOSIS — R911 Solitary pulmonary nodule: Secondary | ICD-10-CM

## 2024-08-06 NOTE — Telephone Encounter (Signed)
 Results reviewed by phone.  New lung nodule with recommendation to repeat LDCT in 6 months, as precaution.  Likely benign finding.  Patient had pneumonia about 3 months before her LDCT was completed.  She is not having symptoms of illness now.  Atherosclerosis and emphysema noted, as previously seen.  Patient states she is on statin medication. Also noted was possible pulmonary hypertension.  Patient states her blood pressure is generally normal or 'low.'  Will discuss with PCP at next visit for any further recommendation.  New order placed for follow up LDCT.  Patient had no questions and acknowledged understanding.

## 2024-08-06 NOTE — Telephone Encounter (Signed)
 Call report LDCT received:    IMPRESSION: 1. New 5.4 mm lateral left lower lobe nodule. Lung-RADS 3, probably benign findings. Short-term follow-up in 6 months is recommended with repeat low-dose chest CT without contrast (please use the following order, CT CHEST LCS NODULE FOLLOW-UP W/O CM). These results will be called to the ordering clinician or representative by the Radiologist Assistant, and communication documented in the PACS or [employer]. 2.  Aortic atherosclerosis (ICD10-I70.0). 3. Enlarged pulmonic trunk, indicative of pulmonary arterial hypertension. 4.  Emphysema (ICD10-J43.9).

## 2024-08-10 ENCOUNTER — Encounter

## 2024-08-10 DIAGNOSIS — J432 Centrilobular emphysema: Secondary | ICD-10-CM | POA: Diagnosis not present

## 2024-08-10 NOTE — Progress Notes (Signed)
 Daily Session Note  Patient Details  Name: Lauren Escobar MRN: [id number] Date of Birth: 1966-09-14 Referring Provider:   Flowsheet Row Pulmonary Rehab from 04/28/2024 in [facility] Cardiac and Pulmonary Rehab  Referring Provider [name] MD    Encounter Date: 08/10/2024  Check In:  Session Check In - 08/10/24 1149       Check-In   Supervising physician immediately available to respond to emergencies See telemetry face sheet for immediately available ER MD    Location [facility]-Cardiac & Pulmonary Rehab    Staff Present Burnard Davenport RN,BSN,MPA;Meredith Tressa RN,BSN;Maxon Conetta BS, Exercise Physiologist;Jason Elnor RDN,LDN    Virtual Visit No    Medication changes reported     No    Fall or balance concerns reported    No    Tobacco Cessation No Change    Warm-up and Cool-down Performed on first and last piece of equipment    Resistance Training Performed Yes    VAD Patient? No    PAD/SET Patient? No      Pain Assessment   Currently in Pain? No/denies             Social History   Tobacco Use  Smoking Status Former   Current packs/day: 0.00   Average packs/day: 2.8 packs/day for 29.0 years (79.8 ttl pk-yrs)   Types: Cigarettes   Start date: 10/1991   Quit date: 10/2020   Years since quitting: 3.7  Smokeless Tobacco Never    Goals Met:  Independence with exercise equipment Exercise tolerated well No report of concerns or symptoms today Strength training completed today  Goals Unmet:  Not Applicable  Comments: Pt able to follow exercise prescription today without complaint.  Will continue to monitor for progression.    Dr. Oneil Pinal is Medical Director for [facility] Cardiac Rehabilitation.  Dr. Fuad Aleskerov is Medical Director for [facility] Pulmonary Rehabilitation.

## 2024-08-12 ENCOUNTER — Encounter

## 2024-08-12 DIAGNOSIS — J432 Centrilobular emphysema: Secondary | ICD-10-CM

## 2024-08-12 NOTE — Progress Notes (Signed)
 Daily Session Note  Patient Details  Name: Lauren Escobar MRN: [id number] Date of Birth: 10/18/1966 Referring Provider:   Flowsheet Row Pulmonary Rehab from 04/28/2024 in [facility] Cardiac and Pulmonary Rehab  Referring Provider [name] MD    Encounter Date: 08/12/2024  Check In:  Session Check In - 08/12/24 1059       Check-In   Supervising physician immediately available to respond to emergencies See telemetry face sheet for immediately available ER MD    Location [facility]-Cardiac & Pulmonary Rehab    Staff Present Burnard Davenport RN,BSN,MPA;Joseph Hood RCP,RRT,BSRT;Maxon Conetta BS, Exercise Physiologist;Margaret Best, MS, Exercise Physiologist;Jason Elnor RDN,LDN    Virtual Visit No    Medication changes reported     No    Fall or balance concerns reported    No    Tobacco Cessation No Change    Warm-up and Cool-down Performed on first and last piece of equipment    Resistance Training Performed Yes    VAD Patient? No    PAD/SET Patient? No      Pain Assessment   Currently in Pain? No/denies             Social History   Tobacco Use  Smoking Status Former   Current packs/day: 0.00   Average packs/day: 2.8 packs/day for 29.0 years (79.8 ttl pk-yrs)   Types: Cigarettes   Start date: 10/1991   Quit date: 10/2020   Years since quitting: 3.7  Smokeless Tobacco Never    Goals Met:  Proper associated with RPD/PD & O2 Sat Independence with exercise equipment Using PLB without cueing & demonstrates good technique Exercise tolerated well No report of concerns or symptoms today Strength training completed today  Goals Unmet:  Not Applicable  Comments: Pt able to follow exercise prescription today without complaint.  Will continue to monitor for progression.    Dr. Oneil Pinal is Medical Director for [facility] Cardiac Rehabilitation.  Dr. Fuad Aleskerov is Medical Director for [facility] Pulmonary Rehabilitation.

## 2024-08-17 ENCOUNTER — Encounter

## 2024-08-17 DIAGNOSIS — J432 Centrilobular emphysema: Secondary | ICD-10-CM

## 2024-08-17 NOTE — Progress Notes (Signed)
 Daily Session Note  Patient Details  Name: Lauren Escobar MRN: [id number] Date of Birth: 1966-02-12 Referring Provider:   Conrad Ports Pulmonary Rehab from 04/28/2024 in [facility] Cardiac and Pulmonary Rehab  Referring Provider [name] MD    Encounter Date: 08/17/2024  Check In:  Session Check In - 08/17/24 1050       Check-In   Supervising physician immediately available to respond to emergencies See telemetry face sheet for immediately available ER MD    Location [facility]-Cardiac & Pulmonary Rehab    Staff Present Burnard Davenport RN,BSN,MPA;Karna Serve PhD, RN,CNS,CEN;Maxon [facility], Exercise Physiologist;Jason Elnor [facility]    Virtual Visit No    Medication changes reported     No    Fall or balance concerns reported    No    Tobacco Cessation No Change    Warm-up and Cool-down Performed on first and last piece of equipment    Resistance Training Performed Yes    VAD Patient? No    PAD/SET Patient? No      Pain Assessment   Currently in Pain? No/denies             Social History   Tobacco Use  Smoking Status Former   Current packs/day: 0.00   Average packs/day: 2.8 packs/day for 29.0 years (79.8 ttl pk-yrs)   Types: Cigarettes   Start date: 10/1991   Quit date: 10/2020   Years since quitting: 3.8  Smokeless Tobacco Never    Goals Met:  Proper associated with RPD/PD & O2 Sat Independence with exercise equipment Using PLB without cueing & demonstrates good technique Exercise tolerated well No report of concerns or symptoms today Strength training completed today  Goals Unmet:  Not Applicable  Comments: Pt able to follow exercise prescription today without complaint.  Will continue to monitor for progression.    Dr. Oneil Pinal is Medical Director for [facility] Cardiac Rehabilitation.  Dr. Fuad Aleskerov is Medical Director for [facility] Pulmonary Rehabilitation.

## 2024-08-18 ENCOUNTER — Encounter: Payer: Self-pay | Admitting: *Deleted

## 2024-08-18 DIAGNOSIS — J432 Centrilobular emphysema: Secondary | ICD-10-CM

## 2024-08-18 NOTE — Progress Notes (Signed)
 Pulmonary Individual Treatment Plan  Patient Details  Name: Lauren Escobar MRN: [id number] Date of Birth: 03/10/1966 Referring Provider:   Flowsheet Row Pulmonary Rehab from 04/28/2024 in [facility] Cardiac and Pulmonary Rehab  Referring Provider [name] MD    Initial Encounter Date:  Flowsheet Row Pulmonary Rehab from 04/28/2024 in [facility] Cardiac and Pulmonary Rehab  Date 04/28/24    Visit Diagnosis: Centrilobular emphysema (HCC)  Patient's Home Medications on Admission:  Current Outpatient Medications:    albuterol  (PROVENTIL ) (2.5 MG/3ML) 0.083% nebulizer solution, Take 3 mLs (2.5 mg total) by nebulization every 6 (six) hours as needed for wheezing or shortness of breath., Disp: 75 mL, Rfl: 12   ARIPiprazole  (ABILIFY ) 2 MG tablet, Take 2 mg by mouth daily. (Patient not taking: Reported on 07/26/2024), Disp: , Rfl:    aspirin  EC 81 MG tablet, Take 81 mg by mouth daily., Disp: , Rfl:    atorvastatin  (LIPITOR) 20 MG tablet, Take 20 mg by mouth at bedtime., Disp: , Rfl:    benzonatate  (TESSALON ) 100 MG capsule, Take 1 capsule (100 mg total) by mouth 3 (three) times daily as needed for cough., Disp: 20 capsule, Rfl: 0   busPIRone  (BUSPAR ) 7.5 MG tablet, Take 7.5 mg by mouth 3 (three) times daily., Disp: , Rfl:    cetirizine (ZYRTEC) 10 MG tablet, Take 10 mg by mouth daily., Disp: , Rfl:    esomeprazole (NEXIUM) 40 MG capsule, Take 40 mg by mouth daily. , Disp: , Rfl:    Fluticasone -Umeclidin-Vilant (TRELEGY ELLIPTA ) 200-62.5-25 MCG/ACT AEPB, Inhale 1 puff into the lungs daily., Disp: 60 each, Rfl: 11   folic acid  (FOLVITE ) 1 MG tablet, Take 1 mg by mouth daily., Disp: , Rfl:    hydroxyurea  (HYDREA ) 500 MG capsule, Take 500 mg by mouth 2 (two) times daily. May take with food to minimize GI side effects., Disp: , Rfl:    ipratropium-albuterol  (DUONEB) 0.5-2.5 (3) MG/3ML SOLN, Take 3 mLs by nebulization every 6 (six) hours for 7 days., Disp: 90 mL, Rfl: 0   isosorbide  mononitrate (IMDUR ) 30  MG 24 hr tablet, Take 30 mg by mouth daily., Disp: , Rfl:    mirtazapine  (REMERON ) 15 MG tablet, Take 15 mg by mouth at bedtime., Disp: , Rfl:    Multiple Vitamins-Minerals (MULTIVIT/MULTIMINERAL ADULT PO), Take 1 tablet by mouth daily., Disp: , Rfl:    propranolol  (INDERAL ) 40 MG tablet, Take 40 mg by mouth 2 (two) times daily., Disp: , Rfl:   Past Medical History: Past Medical History:  Diagnosis Date   Abdominal pain    Abnormal mammogram    Anxiety    Asthma    Bleeding disorder    Blood dyscrasia    polycythemia vera   Chronic kidney disease    COPD (chronic obstructive pulmonary disease) (HCC)    Cough    Depression    Diabetes mellitus without complication (HCC)    Fatigue    Headache 2000   Horizontal nystagmus    age 58   Hyperglycemia    Hypertension    Low back pain    Major depressive disorder    Nasal lesion    Neck pain    Snoring    SOB (shortness of breath)    Tobacco use    UTI (urinary tract infection)    Von Willebrand's disease (HCC)     Tobacco Use: Social History   Tobacco Use  Smoking Status Former   Current packs/day: 0.00   Average packs/day: 2.8 packs/day  for 29.0 years (79.8 ttl pk-yrs)   Types: Cigarettes   Start date: 10/1991   Quit date: 10/2020   Years since quitting: 3.8  Smokeless Tobacco Never    Labs: Review Flowsheet  More data exists      Latest Ref Rng & Units 01/01/2014 06/16/2017 10/15/2022 01/07/2024 04/12/2024  Labs for ITP Cardiac and Pulmonary Rehab  Cholestrol 0 - 200 mg/dL 890  - 824  - -  LDL (calc) 0 - 99 mg/dL 40  - 92  - -  HDL-C >59 mg/dL 34  - 49  - -  Trlycerides <150 mg/dL 824  - 827  - -  Hemoglobin A1c 4.8 - 5.6 % 5.5  5.5  6.1  - -  Bicarbonate 20.0 - 28.0 mmol/L - - 23.4  26.5  27.3   Acid-base deficit 0.0 - 2.0 mmol/L - - 0.7  - -  O2 Saturation % - - 84.1  58.3  38      Pulmonary Assessment Scores:  Pulmonary Assessment Scores     Row Name 04/28/24 1546         ADL UCSD   ADL Phase Entry      SOB Score total 78     Rest 3     Walk 4     Stairs 5     Bath 3     Dress 3     Shop 4       CAT Score   CAT Score 29       mMRC Score   mMRC Score 4        UCSD: Self-administered rating of dyspnea associated with activities of daily living (ADLs) 6-point scale (0 = not at all to 5 = maximal or unable to do because of breathlessness)  Scoring Scores range from 0 to 120.  Minimally important difference is 5 units  CAT: CAT can identify the health impairment of COPD patients and is better correlated with disease progression.  CAT has a scoring range of zero to 40. The CAT score is classified into four groups of low (less than 10), medium (10 - 20), high (21-30) and very high (31-40) based on the impact level of disease on health status. A CAT score over 10 suggests significant symptoms.  A worsening CAT score could be explained by an exacerbation, poor medication adherence, poor inhaler technique, or progression of COPD or comorbid conditions.  CAT MCID is 2 points  mMRC: mMRC (Modified Medical Research Council) Dyspnea Scale is used to assess the degree of baseline functional disability in patients of respiratory disease due to dyspnea. No minimal important difference is established. A decrease in score of 1 point or greater is considered a positive change.   Pulmonary Function Assessment:   Exercise Target Goals: Exercise Program Goal: Individual exercise prescription set using results from initial 6 min walk test and THRR while considering  patient's activity barriers and safety.   Exercise Prescription Goal: Initial exercise prescription builds to 30-45 minutes a day of aerobic activity, 2-3 days per week.  Home exercise guidelines will be given to patient during program as part of exercise prescription that the participant will acknowledge.  Education: Aerobic Exercise: - Group verbal and visual presentation on the components of exercise prescription. Introduces  F.I.T.T principle from ACSM for exercise prescriptions.  Reviews F.I.T.T. principles of aerobic exercise including progression. Written material provided at class time.   Education: Resistance Exercise: - Group verbal and visual presentation on the components  of exercise prescription. Introduces F.I.T.T principle from ACSM for exercise prescriptions  Reviews F.I.T.T. principles of resistance exercise including progression. Written material provided at class time. Flowsheet Row Pulmonary Rehab from 12/25/2022 in [facility] Cardiac and Pulmonary Rehab  Date 11/20/22  Educator [facility]  Instruction Review Code 1- Verbalizes Understanding     Education: Exercise & Equipment Safety: - Individual verbal instruction and demonstration of equipment use and safety with use of the equipment. Flowsheet Row Pulmonary Rehab from 04/28/2024 in [facility] Cardiac and Pulmonary Rehab  Date 04/28/24  Educator MB  Instruction Review Code 1- Verbalizes Understanding    Education: Exercise Physiology & General Exercise Guidelines: - Group verbal and written instruction with models to review the exercise physiology of the cardiovascular system and associated critical values. Provides general exercise guidelines with specific guidelines to those with heart or lung disease.    Education: Flexibility, Balance, Mind/Body Relaxation: - Group verbal and visual presentation with interactive activity on the components of exercise prescription. Introduces F.I.T.T principle from ACSM for exercise prescriptions. Reviews F.I.T.T. principles of flexibility and balance exercise training including progression. Also discusses the mind body connection.  Reviews various relaxation techniques to help reduce and manage stress (i.e. Deep breathing, progressive muscle relaxation, and visualization). Balance handout provided to take home. Written material provided at class time. Flowsheet Row Pulmonary Rehab from 12/25/2022 in [facility] Cardiac and Pulmonary  Rehab  Date 11/20/22  Educator [facility]  Instruction Review Code 1- Verbalizes Understanding    Activity Barriers & Risk Stratification:  Activity Barriers & Cardiac Risk Stratification - 04/28/24 1542       Activity Barriers & Cardiac Risk Stratification   Activity Barriers Left Knee Replacement;Back Problems;Shortness of Breath;Balance Concerns          6 Minute Walk:  6 Minute Walk     Row Name 04/28/24 1538         6 Minute Walk   Phase Initial     Distance 1170 feet     Walk Time 6 minutes     # of Rest Breaks 0     MPH 2.2     METS 3.13     RPE 19     Perceived Dyspnea  2     Symptoms Yes (comment)     Comments Chest pain 9/10 and resolved to a 7/10 at rest     Resting HR 93 bpm     Resting BP 100/70     Resting Oxygen Saturation  96 %     Exercise Oxygen Saturation  during 6 min walk 91 %     Max Ex. HR 117 bpm     Max Ex. BP 120/78     2 Minute Post BP 92/70       Interval HR   1 Minute HR 100     2 Minute HR 117     3 Minute HR 112     4 Minute HR 110     5 Minute HR 87     6 Minute HR 95     2 Minute Post HR 91     Interval Heart Rate? Yes       Interval Oxygen   Interval Oxygen? Yes     Baseline Oxygen Saturation % 96 %     1 Minute Oxygen Saturation % 91 %     1 Minute Liters of Oxygen 0 L     2 Minute Oxygen Saturation % 91 %     2  Minute Liters of Oxygen 0 L     3 Minute Oxygen Saturation % 94 %     3 Minute Liters of Oxygen 0 L     4 Minute Oxygen Saturation % 94 %     4 Minute Liters of Oxygen 0 L     5 Minute Oxygen Saturation % 94 %     5 Minute Liters of Oxygen 0 L     6 Minute Oxygen Saturation % 93 %     6 Minute Liters of Oxygen 0 L     2 Minute Post Oxygen Saturation % 98 %     2 Minute Post Liters of Oxygen 0 L       Oxygen Initial Assessment:  Oxygen Initial Assessment - 04/27/24 1304       Home Oxygen   Home Oxygen Device None    Sleep Oxygen Prescription None    Home Exercise Oxygen Prescription None    Home Resting  Oxygen Prescription None    Compliance with Home Oxygen Use Yes      Intervention   Short Term Goals To learn and understand importance of maintaining oxygen saturations>88%;To learn and understand importance of monitoring SPO2 with pulse oximeter and demonstrate accurate use of the pulse oximeter.;To learn and demonstrate proper pursed lip breathing techniques or other breathing techniques. ;To learn and demonstrate proper use of respiratory medications    Long  Term Goals Verbalizes importance of monitoring SPO2 with pulse oximeter and return demonstration;Maintenance of O2 saturations>88%;Exhibits proper breathing techniques, such as pursed lip breathing or other method taught during program session;Compliance with respiratory medication;Demonstrates proper use of MDI's          Oxygen Re-Evaluation:  Oxygen Re-Evaluation     Row Name 05/04/24 1056 07/08/24 1129           Program Oxygen Prescription   Program Oxygen Prescription None None        Home Oxygen   Home Oxygen Device None None      Sleep Oxygen Prescription None None      Home Exercise Oxygen Prescription None None      Home Resting Oxygen Prescription None None      Compliance with Home Oxygen Use Yes Yes        Goals/Expected Outcomes   Short Term Goals To learn and understand importance of maintaining oxygen saturations>88%;To learn and understand importance of monitoring SPO2 with pulse oximeter and demonstrate accurate use of the pulse oximeter.;To learn and demonstrate proper pursed lip breathing techniques or other breathing techniques. ;To learn and demonstrate proper use of respiratory medications To learn and understand importance of maintaining oxygen saturations>88%;To learn and understand importance of monitoring SPO2 with pulse oximeter and demonstrate accurate use of the pulse oximeter.;To learn and demonstrate proper pursed lip breathing techniques or other breathing techniques. ;To learn and demonstrate  proper use of respiratory medications      Long  Term Goals Verbalizes importance of monitoring SPO2 with pulse oximeter and return demonstration;Maintenance of O2 saturations>88%;Exhibits proper breathing techniques, such as pursed lip breathing or other method taught during program session;Compliance with respiratory medication;Demonstrates proper use of MDI's Verbalizes importance of monitoring SPO2 with pulse oximeter and return demonstration;Maintenance of O2 saturations>88%;Exhibits proper breathing techniques, such as pursed lip breathing or other method taught during program session;Compliance with respiratory medication;Demonstrates proper use of MDI's      Comments Reviewed PLB technique with pt.  Talked about how it works and it's importance in  maintaining their exercise saturations. We reviewed PLB technique with pt. She has been practicing her PLB technique while exercising. She states that it is hardest for her to breath when she begins to walk faster. We talked about how PLB works and it's importance in maintaining her exercise saturations.      Goals/Expected Outcomes Short: Become more profiecient at using PLB. Long: Become independent at using PLB. Short: Continue to practice PLB with exercise. Long: Become independent at using PLB.         Oxygen Discharge (Final Oxygen Re-Evaluation):  Oxygen Re-Evaluation - 07/08/24 1129       Program Oxygen Prescription   Program Oxygen Prescription None      Home Oxygen   Home Oxygen Device None    Sleep Oxygen Prescription None    Home Exercise Oxygen Prescription None    Home Resting Oxygen Prescription None    Compliance with Home Oxygen Use Yes      Goals/Expected Outcomes   Short Term Goals To learn and understand importance of maintaining oxygen saturations>88%;To learn and understand importance of monitoring SPO2 with pulse oximeter and demonstrate accurate use of the pulse oximeter.;To learn and demonstrate proper pursed lip  breathing techniques or other breathing techniques. ;To learn and demonstrate proper use of respiratory medications    Long  Term Goals Verbalizes importance of monitoring SPO2 with pulse oximeter and return demonstration;Maintenance of O2 saturations>88%;Exhibits proper breathing techniques, such as pursed lip breathing or other method taught during program session;Compliance with respiratory medication;Demonstrates proper use of MDI's    Comments We reviewed PLB technique with pt. She has been practicing her PLB technique while exercising. She states that it is hardest for her to breath when she begins to walk faster. We talked about how PLB works and it's importance in maintaining her exercise saturations.    Goals/Expected Outcomes Short: Continue to practice PLB with exercise. Long: Become independent at using PLB.          Initial Exercise Prescription:  Initial Exercise Prescription - 04/28/24 1500       Date of Initial Exercise RX and Referring Provider   Date 04/28/24    Referring Provider [name] MD      Oxygen   Maintain Oxygen Saturation 88% or higher      Recumbant Bike   Level 2    RPM 50    Watts 25    Minutes 15    METs 3.13      NuStep   Level 2    SPM 80    Minutes 15    METs 3.13      REL-XR   Level 1    Speed 50    Minutes 15    METs 3.13      T5 Nustep   Level 2    SPM 80    Minutes 15    METs 3.13      Track   Laps 26    Minutes 15    METs 2.41      Prescription Details   Frequency (times per week) 2    Duration Progress to 30 minutes of continuous aerobic without signs/symptoms of physical distress      Intensity   THRR 40-80% of Max Heartrate 117-147    Ratings of Perceived Exertion 11-13    Perceived Dyspnea 0-4      Progression   Progression Continue to progress workloads to maintain intensity without signs/symptoms of physical distress.  Resistance Training   Training Prescription Yes    Weight 4 lb    Reps 10-15           Perform Capillary Blood Glucose checks as needed.  Exercise Prescription Changes:   Exercise Prescription Changes     Row Name 04/28/24 1500 05/18/24 1400 05/31/24 1600 06/17/24 0900 06/29/24 1000     Response to Exercise   Blood Pressure (Admit) 100/70 124/68 132/68 96/52 96/60    Blood Pressure (Exercise) 120/78 120/60 126/68 130/70 --   Blood Pressure (Exit) 92/70 118/62 102/60 118/60 104/54   Heart Rate (Admit) 93 bpm 90 bpm 107 bpm 103 bpm 85 bpm   Heart Rate (Exercise) 117 bpm 98 bpm 106 bpm 108 bpm 97 bpm   Heart Rate (Exit) 88 bpm 89 bpm 89 bpm 92 bpm 82 bpm   Oxygen Saturation (Admit) 96 % 95 % 97 % 95 % 96 %   Oxygen Saturation (Exercise) 91 % 92 % 96 % 91 % 95 %   Oxygen Saturation (Exit) 98 % 93 % 98 % 97 % 96 %   Rating of Perceived Exertion (Exercise) 19 14 15 14 15    Perceived Dyspnea (Exercise) 2 1 2 2 2    Symptoms chest pain 9/10, resolved to 7/10 with rest none none none none   Comments results 1st 2 weeks of exercise sessions -- -- --   Duration -- Progress to 30 minutes of  aerobic without signs/symptoms of physical distress Continue with 30 min of aerobic exercise without signs/symptoms of physical distress. Continue with 30 min of aerobic exercise without signs/symptoms of physical distress. Continue with 30 min of aerobic exercise without signs/symptoms of physical distress.   Intensity THRR New THRR unchanged THRR unchanged THRR unchanged THRR unchanged     Progression   Progression -- Continue to progress workloads to maintain intensity without signs/symptoms of physical distress. Continue to progress workloads to maintain intensity without signs/symptoms of physical distress. Continue to progress workloads to maintain intensity without signs/symptoms of physical distress. Continue to progress workloads to maintain intensity without signs/symptoms of physical distress.   Average METs 3.13 2.37 2.78 2.52 2.55     Resistance Training   Training  Prescription -- Yes Yes Yes Yes   Weight -- 4 lb 4 lb 4 lb 4 lb   Reps -- 10-15 10-15 10-15 10-15     Interval Training   Interval Training -- No No No No     Treadmill   MPH -- -- -- 1.5 1.6   Grade -- -- -- -- 0   Minutes -- -- -- 15 15   METs -- -- -- 2.15 2.23     NuStep   Level -- 2 3 2  --   Minutes -- 15 15 15  --   METs -- 2.2 2.4 2 --     REL-XR   Level -- 2 2 4 5    Minutes -- 15 15 15 15    METs -- 2.7 3.7 -- 3.5     Track   Laps -- 30 30 17 17    Minutes -- 15 15 15 15    METs -- 2.63 2.63 1.92 1.92     Oxygen   Maintain Oxygen Saturation -- 88% or higher 88% or higher 88% or higher 88% or higher    Row Name 07/13/24 1100 07/15/24 0800 07/26/24 1000 08/11/24 1100       Response to Exercise   Blood Pressure (Admit) -- 94/56 104/62 104/60  Blood Pressure (Exit) -- 112/60 104/58 96/56    Heart Rate (Admit) -- 80 bpm 95 bpm 85 bpm    Heart Rate (Exercise) -- 93 bpm 112 bpm 109 bpm    Heart Rate (Exit) -- 85 bpm 83 bpm 89 bpm    Oxygen Saturation (Admit) -- 97 % 99 % 96 %    Oxygen Saturation (Exercise) -- 96 % 90 % 91 %    Oxygen Saturation (Exit) -- 97 % 97 % 95 %    Rating of Perceived Exertion (Exercise) -- 14 15 13     Perceived Dyspnea (Exercise) -- 2 1 3     Symptoms -- none none none    Duration -- Continue with 30 min of aerobic exercise without signs/symptoms of physical distress. Continue with 30 min of aerobic exercise without signs/symptoms of physical distress. Continue with 30 min of aerobic exercise without signs/symptoms of physical distress.    Intensity -- THRR unchanged THRR unchanged THRR unchanged      Progression   Progression -- Continue to progress workloads to maintain intensity without signs/symptoms of physical distress. Continue to progress workloads to maintain intensity without signs/symptoms of physical distress. Continue to progress workloads to maintain intensity without signs/symptoms of physical distress.    Average METs -- 2.28  2.73 3.02      Resistance Training   Training Prescription -- Yes Yes Yes    Weight -- 4 lb 4 lb 4 lb    Reps -- 10-15 10-15 10-15      Interval Training   Interval Training -- No No No      Treadmill   MPH -- 2.1 2.1 2.2    Grade -- 0 0 0    Minutes -- 15 15 15     METs -- 2.61 2.61 2.69      REL-XR   Level -- 4 6 6     Minutes -- 15 15 15     METs -- -- 2.6 3.7      Home Exercise Plan   Plans to continue exercise at Home (comment)  Walking at home on days away from rehab Home (comment)  Walking at home on days away from rehab Home (comment)  Walking at home on days away from rehab Home (comment)  Walking at home on days away from rehab    Frequency Add 1 additional day to program exercise sessions. Add 1 additional day to program exercise sessions. Add 1 additional day to program exercise sessions. Add 1 additional day to program exercise sessions.    Initial Home Exercises Provided 07/13/24 07/13/24 07/13/24 07/13/24      Oxygen   Maintain Oxygen Saturation 88% or higher 88% or higher 88% or higher 88% or higher       Exercise Comments:   Exercise Comments     Row Name 05/04/24 1053           Exercise Comments First full day of exercise!  Patient was oriented to gym and equipment including functions, settings, policies, and procedures.  Patient's individual exercise prescription and treatment plan were reviewed.  All starting workloads were established based on the results of the 6 minute walk test done at initial orientation visit.  The plan for exercise progression was also introduced and progression will be customized based on patient's performance and goals.          Exercise Goals and Review:   Exercise Goals     Row Name 04/28/24 [phone]  Exercise Goals   Increase Physical Activity Yes       Intervention Provide advice, education, support and counseling about physical activity/exercise needs.;Develop an individualized exercise prescription for  aerobic and resistive training based on initial evaluation findings, risk stratification, comorbidities and participant's personal goals.       Expected Outcomes Long Term: Exercising regularly at least 3-5 days a week.;Long Term: Add in home exercise to make exercise part of routine and to increase amount of physical activity.;Short Term: Attend rehab on a regular basis to increase amount of physical activity.       Increase Strength and Stamina Yes       Intervention Provide advice, education, support and counseling about physical activity/exercise needs.;Develop an individualized exercise prescription for aerobic and resistive training based on initial evaluation findings, risk stratification, comorbidities and participant's personal goals.       Expected Outcomes Short Term: Increase workloads from initial exercise prescription for resistance, speed, and METs.;Short Term: Perform resistance training exercises routinely during rehab and add in resistance training at home;Long Term: Improve cardiorespiratory fitness, muscular endurance and strength as measured by increased METs and functional capacity ( )       Able to understand and use rate of perceived exertion (RPE) scale Yes       Intervention Provide education and explanation on how to use RPE scale       Expected Outcomes Short Term: Able to use RPE daily in rehab to express subjective intensity level;Long Term:  Able to use RPE to guide intensity level when exercising independently       Able to understand and use Dyspnea scale Yes       Intervention Provide education and explanation on how to use Dyspnea scale       Expected Outcomes Short Term: Able to use Dyspnea scale daily in rehab to express subjective sense of shortness of breath during exertion;Long Term: Able to use Dyspnea scale to guide intensity level when exercising independently       Knowledge and understanding of Target Heart Rate Range (THRR) Yes       Intervention Provide  education and explanation of THRR including how the numbers were predicted and where they are located for reference       Expected Outcomes Short Term: Able to state/look up THRR;Short Term: Able to use daily as guideline for intensity in rehab;Long Term: Able to use THRR to govern intensity when exercising independently       Able to check pulse independently Yes       Intervention Provide education and demonstration on how to check pulse in carotid and radial arteries.;Review the importance of being able to check your own pulse for safety during independent exercise       Expected Outcomes Short Term: Able to explain why pulse checking is important during independent exercise;Long Term: Able to check pulse independently and accurately       Understanding of Exercise Prescription Yes       Intervention Provide education, explanation, and written materials on patient's individual exercise prescription       Expected Outcomes Short Term: Able to explain program exercise prescription;Long Term: Able to explain home exercise prescription to exercise independently          Exercise Goals Re-Evaluation :  Exercise Goals Re-Evaluation     Row Name 05/04/24 1053 05/18/24 1444 05/31/24 1618 06/17/24 0923 06/29/24 1017     Exercise Goal Re-Evaluation   Exercise Goals Review Increase Physical Activity;Able  to understand and use rate of perceived exertion (RPE) scale;Knowledge and understanding of Target Heart Rate Range (THRR);Understanding of Exercise Prescription;Able to understand and use Dyspnea scale;Able to check pulse independently;Increase Strength and Stamina Increase Physical Activity;Understanding of Exercise Prescription;Increase Strength and Stamina Increase Physical Activity;Understanding of Exercise Prescription;Increase Strength and Stamina Increase Physical Activity;Understanding of Exercise Prescription;Increase Strength and Stamina Increase Physical Activity;Understanding of Exercise  Prescription;Increase Strength and Stamina   Comments Reviewed RPE and dyspnea scale, THR and program prescription with pt today.  Pt voiced understanding and was given a copy of goals to take home. Tierany is off to a good start in the program and completed her first 2 weeks of exercise in this review. She tolerated her exercise prescription well. She worked at level 2 on the T4 nustep and XR. She walked 30 laps on the track. We will continue to monitor her progress in the program. Tanyia is doing well in rehab. She continues to walk 30 laps on the track. She also recently improved to level 3 on the T4 nustep. We will continue to monitor her progress in the program. Gunhild is doing well in rehab. She was recently able to increase from level 2 to 4 on the XR. She was also able to add the treadmill to her current exercise prescription at a speed of 1.5 and no incline. We will continue to monitor her progress in the program. Addasyn is doing well in rehab. She was recently able to increase her speed on the treadmill to 1.6 mph with no incline. She also improved to level 5 on the XR. We will continue to monitor her progress in the program.   Expected Outcomes Short: Use RPE daily to regulate intensity. Long: Follow program prescription in THR. Short: Continue to follow current exercise prescription. Long: Continue exercise to improve strength and stamina. Short: Continue to progressively increase workloads. Long: Continue exercise to improve strength and stamina. Short: Continue to follow exercise prescription, and increase workloads when able. Long: Continue exercise to improve strength and stamina. Short: Continue to progressively increase workloads. Long: Continue exercise to improve strength and stamina.    Row Name 07/13/24 1136 07/15/24 0816 07/26/24 1049 07/27/24 1124 08/11/24 1119     Exercise Goal Re-Evaluation   Exercise Goals Review Increase Physical Activity;Understanding of Exercise  Prescription;Increase Strength and Stamina;Able to understand and use Dyspnea scale;Knowledge and understanding of Target Heart Rate Range (THRR);Able to check pulse independently;Able to understand and use rate of perceived exertion (RPE) scale Increase Physical Activity;Understanding of Exercise Prescription;Increase Strength and Stamina Increase Physical Activity;Understanding of Exercise Prescription;Increase Strength and Stamina Increase Physical Activity;Increase Strength and Stamina;Understanding of Exercise Prescription Increase Physical Activity;Increase Strength and Stamina;Understanding of Exercise Prescription   Comments Reviewed home exercise with pt today from 1125 to 1134.  Pt plans to walk at home for exercise on days away from rehab.  Reviewed THR, pulse, RPE, sign and symptoms, pulse oximetery and when to call 911 or MD.  Also discussed weather considerations and indoor options.  Pt voiced understanding. Mahkayla is doing well in rehab. She increased her workload on the treadmill to a speed of 2.1 mph and no incline. She worked at level 4 on the XR. We will continue to monitor her progress in the program. Brinnley continues to do well in rehab. She was recently able to increase from level 4 to 6 on the XR. She was also able to maintain a treadmill workload of 2. and no incline. We will continue to monitor  her progress in the program. Arieana is doing well at rehab. She is walking at home, both indoor and outdoors. Encouraged her to continue to exercise at home Jaculin continues to do well in rehab. She recently increased her speed on the treadmill to 2.2 mph with no incline. She also continues to work at level 6 on the XR. We will continue to monitor her progress in the program.   Expected Outcomes Short: Begin walking at home on days away from rehab. Long: Continue to exercise independently. Short: Continue to progressively increase treadmill and XR workloads. Long: Continue exercise to improve  strength and stamina. Short: Continue to increase XR workload. Long: Continue exercise to improve strength and stamina. Short: Continue to increase XR workload. Long: Continue exercise to improve strength and stamina. Short: Continue to progressively increase treadmill workload. Long: Continue exercise to improve strength and stamina.      Discharge Exercise Prescription (Final Exercise Prescription Changes):  Exercise Prescription Changes - 08/11/24 1100       Response to Exercise   Blood Pressure (Admit) 104/60    Blood Pressure (Exit) 96/56    Heart Rate (Admit) 85 bpm    Heart Rate (Exercise) 109 bpm    Heart Rate (Exit) 89 bpm    Oxygen Saturation (Admit) 96 %    Oxygen Saturation (Exercise) 91 %    Oxygen Saturation (Exit) 95 %    Rating of Perceived Exertion (Exercise) 13    Perceived Dyspnea (Exercise) 3    Symptoms none    Duration Continue with 30 min of aerobic exercise without signs/symptoms of physical distress.    Intensity THRR unchanged      Progression   Progression Continue to progress workloads to maintain intensity without signs/symptoms of physical distress.    Average METs 3.02      Resistance Training   Training Prescription Yes    Weight 4 lb    Reps 10-15      Interval Training   Interval Training No      Treadmill   MPH 2.2    Grade 0    Minutes 15    METs 2.69      REL-XR   Level 6    Minutes 15    METs 3.7      Home Exercise Plan   Plans to continue exercise at Home (comment)   Walking at home on days away from rehab   Frequency Add 1 additional day to program exercise sessions.    Initial Home Exercises Provided 07/13/24      Oxygen   Maintain Oxygen Saturation 88% or higher          Nutrition:  Target Goals: Understanding of nutrition guidelines, daily intake of sodium 1500mg , cholesterol 200mg , calories 30% from fat and 7% or less from saturated fats, daily to have 5 or more servings of fruits and vegetables.  Education:  Nutrition 1 -Group instruction provided by verbal, written material, interactive activities, discussions, models, and posters to present general guidelines for heart healthy nutrition including macronutrients, label reading, and promoting whole foods over processed counterparts. Education serves as Pensions consultant of discussion of heart healthy eating for all. Written material provided at class time.     Education: Nutrition 2 -Group instruction provided by verbal, written material, interactive activities, discussions, models, and posters to present general guidelines for heart healthy nutrition including sodium, cholesterol, and saturated fat. Providing guidance of habit forming to improve blood pressure, cholesterol, and body weight. Written material provided  at class time.     Biometrics:  Pre Biometrics - 04/28/24 1546       Pre Biometrics   Height 5' 1.5 (1.562 m)    Weight 165 lb 12.8 oz (75.2 kg)    Waist Circumference 42.5 inches    Hip Circumference 42 inches    Waist to Hip Ratio 1.01 %    BMI (Calculated) 30.82    Single Leg Stand 1 seconds           Nutrition Therapy Plan and Nutrition Goals:  Nutrition Therapy & Goals - 04/28/24 1512       Nutrition Therapy   Diet Mediterranean    Protein (specify units) 70-90    Fiber 25 grams    Whole Grain Foods 3 servings    Saturated Fats 15 max. grams    Fruits and Vegetables 5 servings/day    Sodium 2 grams      Personal Nutrition Goals   Nutrition Goal Eat 3 times per day, small frequent meals or nutrient dense snacks (use protein shake if needed)    Personal Goal #2 Eat 15-30gProtein and 30-60gCarbs at each meal.    Personal Goal #3 Drink 16-32oz of water daily    Comments Patient doesn't drink much water, mostly just met dew, about 16oz daily. She doesn't eat breakfast or lunch. Usually just dinner. Spoke with her about the importance of eating more throughout the day. She was not sure she would eat more than she  already was. Suggested a protein shake as a good starting point. Breaking it into two 5.5oz halves if needed. Suggested pairing it with a carb, like fruit or crackers. Provided mediterranean diet handout. Kept goals simple to eat more and drink more water.      Intervention Plan   Intervention Prescribe, educate and counsel regarding individualized specific dietary modifications aiming towards targeted core components such as weight, hypertension, lipid management, diabetes, heart failure and other comorbidities.;Nutrition handout(s) given to patient.    Expected Outcomes Long Term Goal: Adherence to prescribed nutrition plan.;Short Term Goal: A plan has been developed with personal nutrition goals set during dietitian appointment.;Short Term Goal: Understand basic principles of dietary content, such as calories, fat, sodium, cholesterol and nutrients.          Nutrition Assessments:  MEDIFICTS Score Key: >=70 Need to make dietary changes  40-70 Heart Healthy Diet <= 40 Therapeutic Level Cholesterol Diet  Flowsheet Row Pulmonary Rehab from 04/28/2024 in [facility] Cardiac and Pulmonary Rehab  Picture Your Plate Total Score on Admission 52   Picture Your Plate Scores: <59 Unhealthy dietary pattern with much room for improvement. 41-50 Dietary pattern unlikely to meet recommendations for good health and room for improvement. 51-60 More healthful dietary pattern, with some room for improvement.  >60 Healthy dietary pattern, although there may be some specific behaviors that could be improved.   Nutrition Goals Re-Evaluation:  Nutrition Goals Re-Evaluation     Row Name 07/08/24 1108 07/27/24 1117           Goals   Comment Liyah states that she still does not have much of an appetite and is only eating one meal a day. We encouraged her to try and add at least one more nutrient dense snack or meal so she can get enough nutrients. She states that her one meal a day comes at dinner time and  usually is a crockpot dinner that includes meat and one vegetable. Berlinda is eating one meal per day. does  not have much of a appetite. She knows she needs to do better but doesnt feel like eating. Encouraged small snack and picking nutrient dense foods      Expected Outcome Short: Try adding one more nutrient dense snack each day. Long: Continue to practice heart healthy eating patterns discussed with RD. SGT: Eat 2 to 3 times per day LTG: Continue to practice heart healthy eating patterns discussed with RD.         Nutrition Goals Discharge (Final Nutrition Goals Re-Evaluation):  Nutrition Goals Re-Evaluation - 07/27/24 1117       Goals   Comment Corean is eating one meal per day. does not have much of a appetite. She knows she needs to do better but doesnt feel like eating. Encouraged small snack and picking nutrient dense foods    Expected Outcome SGT: Eat 2 to 3 times per day LTG: Continue to practice heart healthy eating patterns discussed with RD.          Psychosocial: Target Goals: Acknowledge presence or absence of significant depression and/or stress, maximize coping skills, provide positive support system. Participant is able to verbalize types and ability to use techniques and skills needed for reducing stress and depression.   Education: Stress, Anxiety, and Depression - Group verbal and visual presentation to define topics covered.  Reviews how body is impacted by stress, anxiety, and depression.  Also discusses healthy ways to reduce stress and to treat/manage anxiety and depression.  Written material provided at class time. Flowsheet Row Pulmonary Rehab from 12/25/2022 in [facility] Cardiac and Pulmonary Rehab  Date 10/30/22  Educator [facility]  Instruction Review Code 1- [employer] Understanding    Education: Sleep Hygiene -Provides group verbal and written instruction about how sleep can affect your health.  Define sleep hygiene, discuss sleep cycles and impact of sleep habits.  Review good sleep hygiene tips.    Initial Review & Psychosocial Screening:  Initial Psych Review & Screening - 04/27/24 1328       Initial Review   Current issues with None Identified      Family Dynamics   Good Support System? Yes      Barriers   Psychosocial barriers to participate in program There are no identifiable barriers or psychosocial needs.      Screening Interventions   Interventions Encouraged to exercise;To provide support and resources with identified psychosocial needs;Provide feedback about the scores to participant    Expected Outcomes Short Term goal: Utilizing psychosocial counselor, staff and physician to assist with identification of specific Stressors or current issues interfering with healing process. Setting desired goal for each stressor or current issue identified.;Long Term Goal: Stressors or current issues are controlled or eliminated.;Short Term goal: Identification and review with participant of any Quality of Life or Depression concerns found by scoring the questionnaire.;Long Term goal: The participant improves quality of Life and PHQ9 Scores as seen by post scores and/or verbalization of changes          Quality of Life Scores:  Scores of 19 and below usually indicate a poorer quality of life in these areas.  A difference of  2-3 points is a clinically meaningful difference.  A difference of 2-3 points in the total score of the Quality of Life Index has been associated with significant improvement in overall quality of life, self-image, physical symptoms, and general health in studies assessing change in quality of life.  PHQ-9: Review Flowsheet  More data exists      07/27/2024 07/08/2024  04/28/2024 12/30/2022 12/04/2022  Depression screen PHQ 2/9  Decreased Interest 3 3 3 3  0  Down, Depressed, Hopeless 1 0 3 3 3   PHQ - 2 Score 4 3 6 6 3   Altered sleeping 3 3 3 3 3   Tired, decreased energy 3 3 3 3 3   Change in appetite 3 3 3 3 3   Feeling bad or  failure about yourself  3 3 3 3 3   Trouble concentrating 3 3 3 3  0  Moving slowly or fidgety/restless 3 3 3 3 3   Suicidal thoughts 0 0 0 0 0  PHQ-9 Score 22 21 24 24 18   Difficult doing work/chores Extremely dIfficult Extremely dIfficult Very difficult Very difficult Extremely dIfficult   Interpretation of Total Score  Total Score Depression Severity:  1-4 = Minimal depression, 5-9 = Mild depression, 10-14 = Moderate depression, 15-19 = Moderately severe depression, 20-27 = Severe depression   Psychosocial Evaluation and Intervention:  Psychosocial Evaluation - 04/27/24 1329       Psychosocial Evaluation & Interventions   Interventions Encouraged to exercise with the program and follow exercise prescription    Comments Royann is coming to pulmonary rehab with Emphysema. She has done the program before but was unable to complete it. She states she could tell that it made a difference before, so she is willing to try again to help her quality of life. Her medications are going well. She mentions needed a sleep study and is waiting for that to be scheduled. She states she doesn't have any concerns and just takes it day by day with her health.    Expected Outcomes Short: attend pulmonary rehab for education and exercise Long: develop and maintain positive self care habits    Continue Psychosocial Services  Follow up required by staff          Psychosocial Re-Evaluation:  Psychosocial Re-Evaluation     Row Name 07/08/24 1118 07/27/24 1114           Psychosocial Re-Evaluation   Current issues with Current Depression;Current Anxiety/Panic;Current Sleep Concerns;Current Stress Concerns Current Sleep Concerns;Current Stress Concerns;Current Anxiety/Panic      Comments Maud states that the majority of her stress comes from her health concerns and taking care of her family. She reports her stress has caused her not to be able to sleep very well and caused a loss of appetite. We reviewed her  PHQ-9 questionnaire today and her score improved some but was still high. She states that she does not really have hobbies to help her relieve stress but does have her faith in the [place]. Teneshia reports she is experiencing stress from health and family. She has anxiety that impacts her sleep. She only gets 4hrs of sleep. She says she does not have coping mechanisms. She says she would prefer to keep these things to herself. Encouraged her to continue to exercise as a way to relief stress and improve mental mood.      Expected Outcomes Short: Talk to doctor about sleep and anxiety. Long: Continue to exercise for mental boost. Short: Talk to doctor about sleep and anxiety. Long: Continue to exercise for mental boost.      Interventions Encouraged to attend Pulmonary Rehabilitation for the exercise Encouraged to attend Pulmonary Rehabilitation for the exercise      Continue Psychosocial Services  Follow up required by staff Follow up required by staff         Psychosocial Discharge (Final Psychosocial Re-Evaluation):  Psychosocial Re-Evaluation - 07/27/24  1114       Psychosocial Re-Evaluation   Current issues with Current Sleep Concerns;Current Stress Concerns;Current Anxiety/Panic    Comments Lycia reports she is experiencing stress from health and family. She has anxiety that impacts her sleep. She only gets 4hrs of sleep. She says she does not have coping mechanisms. She says she would prefer to keep these things to herself. Encouraged her to continue to exercise as a way to relief stress and improve mental mood.    Expected Outcomes Short: Talk to doctor about sleep and anxiety. Long: Continue to exercise for mental boost.    Interventions Encouraged to attend Pulmonary Rehabilitation for the exercise    Continue Psychosocial Services  Follow up required by staff          Education: Education Goals: Education classes will be provided on a weekly basis, covering required topics. Participant  will state understanding/return demonstration of topics presented.  Learning Barriers/Preferences:  Learning Barriers/Preferences - 04/27/24 1307       Learning Barriers/Preferences   Learning Barriers None    Learning Preferences None          General Pulmonary Education Topics:  Infection Prevention: - Provides verbal and written material to individual with discussion of infection control including proper hand washing and proper equipment cleaning during exercise session. Flowsheet Row Pulmonary Rehab from 04/28/2024 in [facility] Cardiac and Pulmonary Rehab  Date 04/28/24  Educator MB  Instruction Review Code 1- Verbalizes Understanding    Falls Prevention: - Provides verbal and written material to individual with discussion of falls prevention and safety. Flowsheet Row Pulmonary Rehab from 04/28/2024 in [facility] Cardiac and Pulmonary Rehab  Date 04/28/24  Educator MB  Instruction Review Code 1- Verbalizes Understanding    Chronic Lung Disease Review: - Group verbal instruction with posters, models, PowerPoint presentations and videos,  to review new updates, new respiratory medications, new advancements in procedures and treatments. Providing information on websites and 800 numbers for continued self-education. Includes information about supplement oxygen, available portable oxygen systems, continuous and intermittent flow rates, oxygen safety, concentrators, and Medicare reimbursement for oxygen. Explanation of Pulmonary Drugs, including class, frequency, complications, importance of spacers, rinsing mouth after steroid MDI's, and proper cleaning methods for nebulizers. Review of basic lung anatomy and physiology related to function, structure, and complications of lung disease. Review of risk factors. Discussion about methods for diagnosing sleep apnea and types of masks and machines for OSA. Includes a review of the use of types of environmental controls: home humidity, furnaces, filters,  dust mite/pet prevention, HEPA vacuums. Discussion about weather changes, air quality and the benefits of nasal washing. Instruction on Warning signs, infection symptoms, calling MD promptly, preventive modes, and value of vaccinations. Review of effective airway clearance, coughing and/or vibration techniques. Emphasizing that all should Create an Action Plan. Written material provided at class time. Flowsheet Row Pulmonary Rehab from 12/25/2022 in [facility] Cardiac and Pulmonary Rehab  Education need identified 09/30/22    AED/CPR: - Group verbal and written instruction with the use of models to demonstrate the basic use of the AED with the basic ABC's of resuscitation.    Tests and Procedures:  - Group verbal and visual presentation and models provide information about basic cardiac anatomy and function. Reviews the testing methods done to diagnose heart disease and the outcomes of the test results. Describes the treatment choices: Medical Management, Angioplasty, or Coronary Bypass Surgery for treating various heart conditions including Myocardial Infarction, Angina, Valve Disease, and Cardiac Arrhythmias.  Written material provided at class time. Flowsheet Row Pulmonary Rehab from 12/25/2022 in [facility] Cardiac and Pulmonary Rehab  Date 12/04/22  Educator SB  Instruction Review Code 1- Verbalizes Understanding    Medication Safety: - Group verbal and visual instruction to review commonly prescribed medications for heart and lung disease. Reviews the medication, class of the drug, and side effects. Includes the steps to properly store meds and maintain the prescription regimen.  Written material given at graduation. Flowsheet Row Pulmonary Rehab from 12/25/2022 in [facility] Cardiac and Pulmonary Rehab  Date 10/09/22  Educator SB  Instruction Review Code 1- Verbalizes Understanding    Other: -Provides group and verbal instruction on various topics (see comments)   Knowledge Questionnaire Score:   Knowledge Questionnaire Score - 05/11/24 1137       Knowledge Questionnaire Score   Pre Score 16/18           Core Components/Risk Factors/Patient Goals at Admission:  Personal Goals and Risk Factors at Admission - 04/28/24 1548       Core Components/Risk Factors/Patient Goals on Admission    Weight Management Yes;Weight Loss    Intervention Weight Management: Develop a combined nutrition and exercise program designed to reach desired caloric intake, while maintaining appropriate intake of nutrient and fiber, sodium and fats, and appropriate energy expenditure required for the weight goal.;Weight Management: Provide education and appropriate resources to help participant work on and attain dietary goals.;Weight Management/Obesity: Establish reasonable short term and long term weight goals.    Admit Weight 165 lb 12.8 oz (75.2 kg)    Goal Weight: Short Term 147 lb 8 oz (66.9 kg)    Goal Weight: Long Term 130 lb (59 kg)    Expected Outcomes Short Term: Continue to assess and modify interventions until short term weight is achieved;Long Term: Adherence to nutrition and physical activity/exercise program aimed toward attainment of established weight goal;Weight Loss: Understanding of general recommendations for a balanced deficit meal plan, which promotes 1-2 lb weight loss per week and includes a negative energy balance of [phone] kcal/d;Understanding recommendations for meals to include 15-35% energy as protein, 25-35% energy from fat, 35-60% energy from carbohydrates, less than 200mg  of dietary cholesterol, 20-35 gm of total fiber daily;Understanding of distribution of calorie intake throughout the day with the consumption of 4-5 meals/snacks    Improve shortness of breath with ADL's Yes    Intervention Provide education, individualized exercise plan and daily activity instruction to help decrease symptoms of SOB with activities of daily living.    Expected Outcomes Short Term: Improve  cardiorespiratory fitness to achieve a reduction of symptoms when performing ADLs;Long Term: Be able to perform more ADLs without symptoms or delay the onset of symptoms    Increase knowledge of respiratory medications and ability to use respiratory devices properly  Yes    Intervention Provide education and demonstration as needed of appropriate use of medications, inhalers, and oxygen therapy.    Expected Outcomes Short Term: Achieves understanding of medications use. Understands that oxygen is a medication prescribed by physician. Demonstrates appropriate use of inhaler and oxygen therapy.;Long Term: Maintain appropriate use of medications, inhalers, and oxygen therapy.    Hypertension Yes    Intervention Provide education on lifestyle modifcations including regular physical activity/exercise, weight management, moderate sodium restriction and increased consumption of fresh fruit, vegetables, and low fat dairy, alcohol moderation, and smoking cessation.;Monitor prescription use compliance.    Expected Outcomes Short Term: Continued assessment and intervention until BP is < 140/61mm HG in  hypertensive participants. < 130/64mm HG in hypertensive participants with diabetes, heart failure or chronic kidney disease.;Long Term: Maintenance of blood pressure at goal levels.          Education:Diabetes - Individual verbal and written instruction to review signs/symptoms of diabetes, desired ranges of glucose level fasting, after meals and with exercise. Acknowledge that pre and post exercise glucose checks will be done for 3 sessions at entry of program. Flowsheet Row Pulmonary Rehab from 12/25/2022 in [facility] Cardiac and Pulmonary Rehab  Date 09/30/22  Educator [facility]  Instruction Review Code 1- Verbalizes Understanding    Know Your Numbers and Heart Failure: - Group verbal and visual instruction to discuss disease risk factors for cardiac and pulmonary disease and treatment options.  Reviews associated  critical values for Overweight/Obesity, Hypertension, Cholesterol, and Diabetes.  Discusses basics of heart failure: signs/symptoms and treatments.  Introduces Heart Failure Zone chart for action plan for heart failure. Written material provided at class time. Flowsheet Row Pulmonary Rehab from 12/25/2022 in [facility] Cardiac and Pulmonary Rehab  Date 12/25/22  Educator [facility]  Instruction Review Code 1- Verbalizes Understanding    Core Components/Risk Factors/Patient Goals Review:   Goals and Risk Factor Review     Row Name 07/08/24 1112 07/27/24 1120           Core Components/Risk Factors/Patient Goals Review   Personal Goals Review Weight Management/Obesity;Improve shortness of breath with ADL's;Hypertension Improve shortness of breath with ADL's      Review Robie states that she would like to lose some weight with a goal of getting down to 115 lb. She weighed in today at 162.7 lb. She is only eating one meal a day and does not understand why she is not losing weight. She states that she was able to breath better when she was previously at a lower weight. She has been checking her BP at home with her personal BP cuff but reports that her readings have been low. She states that her systolic readings are usually in the 90's and her diastolic readings have been in the 50's. We encouraged her to speak with her doctor about her low BP readings. She reports continuing to take all of her medications as prescribed. Reda reports she has seen improvement in breathing since starting the program. Encouraged her to continue to exercise at home and independently of rehab to keep breathing improvement      Expected Outcomes Short: Talk to doctor about low BP readings. Long: Conitnue to manage lifestyle risk factors. Short: continue to exercise post at home. Long: Conitnue to manage lifestyle risk factors.         Core Components/Risk Factors/Patient Goals at Discharge (Final Review):   Goals and Risk Factor  Review - 07/27/24 1120       Core Components/Risk Factors/Patient Goals Review   Personal Goals Review Improve shortness of breath with ADL's    Review Jentri reports she has seen improvement in breathing since starting the program. Encouraged her to continue to exercise at home and independently of rehab to keep breathing improvement    Expected Outcomes Short: continue to exercise post at home. Long: Conitnue to manage lifestyle risk factors.          ITP Comments:  ITP Comments     Row Name 04/27/24 1326 04/28/24 1537 05/04/24 1053 05/26/24 0942 06/23/24 0941   ITP Comments Initial phone call completed. Diagnosis can be found in [facility] 6/12. EP Orientation scheduled for Wednesday 7/2 at 1:30. Completed  and gym orientation for respiratory care services. Initial ITP created and sent for review to Dr. Faud Aleskerov, Medical Director. First full day of exercise!  Patient was oriented to gym and equipment including functions, settings, policies, and procedures.  Patient's individual exercise prescription and treatment plan were reviewed.  All starting workloads were established based on the results of the 6 minute walk test done at initial orientation visit.  The plan for exercise progression was also introduced and progression will be customized based on patient's performance and goals. 30 Day review completed. Medical Director ITP review done, changes made as directed, and signed approval by Medical Director. new to program. 30 Day review completed. Medical Director ITP review done; changes made as directed and signed approval by Medical Director.    Row Name 07/21/24 1406 08/18/24 1029         ITP Comments 30 Day review completed. Medical Director ITP review done; changes made as directed and signed approval by Medical Director. 30 Day review completed. Medical Director ITP review done; changes made as directed and signed approval by Medical Director.         Comments: 30 day review

## 2024-08-19 ENCOUNTER — Encounter: Admitting: Emergency Medicine

## 2024-08-19 DIAGNOSIS — J432 Centrilobular emphysema: Secondary | ICD-10-CM

## 2024-08-19 NOTE — Progress Notes (Signed)
 Daily Session Note  Patient Details  Name: Lauren Escobar MRN: [id number] Date of Birth: 03-23-1966 Referring Provider:   Flowsheet Row Pulmonary Rehab from 04/28/2024 in [facility] Cardiac and Pulmonary Rehab  Referring Provider [name] MD    Encounter Date: 08/19/2024  Check In:  Session Check In - 08/19/24 1124       Check-In   Location [facility]-Cardiac & Pulmonary Rehab    Staff Present Rollene Paterson, MS, Exercise Physiologist;Ryatt Corsino RN,BSN;Joseph [employer] BS, Exercise Physiologist    Virtual Visit No    Medication changes reported     No    Fall or balance concerns reported    No    Tobacco Cessation No Change    Warm-up and Cool-down Performed on first and last piece of equipment    Resistance Training Performed Yes    VAD Patient? No    PAD/SET Patient? No      Pain Assessment   Currently in Pain? No/denies             Social History   Tobacco Use  Smoking Status Former   Current packs/day: 0.00   Average packs/day: 2.8 packs/day for 29.0 years (79.8 ttl pk-yrs)   Types: Cigarettes   Start date: 10/1991   Quit date: 10/2020   Years since quitting: 3.8  Smokeless Tobacco Never    Goals Met:  Proper associated with RPD/PD & O2 Sat Independence with exercise equipment Using PLB without cueing & demonstrates good technique Exercise tolerated well No report of concerns or symptoms today Strength training completed today  Goals Unmet:  Not Applicable  Comments: Pt able to follow exercise prescription today without complaint.  Will continue to monitor for progression.    Dr. Oneil Pinal is Medical Director for [facility] Cardiac Rehabilitation.  Dr. Fuad Aleskerov is Medical Director for [facility] Pulmonary Rehabilitation.

## 2024-08-24 ENCOUNTER — Encounter

## 2024-08-24 DIAGNOSIS — J432 Centrilobular emphysema: Secondary | ICD-10-CM

## 2024-08-24 NOTE — Progress Notes (Signed)
 Daily Session Note  Patient Details  Name: Lauren Escobar MRN: [id number] Date of Birth: Feb 28, 1966 Referring Provider:   Flowsheet Row Pulmonary Rehab from 04/28/2024 in [facility] Cardiac and Pulmonary Rehab  Referring Provider [name] MD    Encounter Date: 08/24/2024  Check In:  Session Check In - 08/24/24 1054       Check-In   Supervising physician immediately available to respond to emergencies See telemetry face sheet for immediately available ER MD    Location [facility]-Cardiac & Pulmonary Rehab    Staff Present Burnard Davenport RN,BSN,MPA;Meredith Tressa RN,BSN;Maxon Conetta BS, Exercise Physiologist;Jason Elnor RDN,LDN    Virtual Visit No    Medication changes reported     No    Fall or balance concerns reported    No    Tobacco Cessation No Change    Warm-up and Cool-down Performed on first and last piece of equipment    Resistance Training Performed Yes    VAD Patient? No    PAD/SET Patient? No      Pain Assessment   Currently in Pain? No/denies             Social History   Tobacco Use  Smoking Status Former   Current packs/day: 0.00   Average packs/day: 2.8 packs/day for 29.0 years (79.8 ttl pk-yrs)   Types: Cigarettes   Start date: 10/1991   Quit date: 10/2020   Years since quitting: 3.8  Smokeless Tobacco Never    Goals Met:  Proper associated with RPD/PD & O2 Sat Independence with exercise equipment Using PLB without cueing & demonstrates good technique Exercise tolerated well No report of concerns or symptoms today Strength training completed today  Goals Unmet:  Not Applicable  Comments: Pt able to follow exercise prescription today without complaint.  Will continue to monitor for progression.    Dr. Oneil Pinal is Medical Director for [facility] Cardiac Rehabilitation.  Dr. Fuad Aleskerov is Medical Director for [facility] Pulmonary Rehabilitation.

## 2024-08-26 ENCOUNTER — Encounter: Admitting: Emergency Medicine

## 2024-08-26 DIAGNOSIS — J432 Centrilobular emphysema: Secondary | ICD-10-CM

## 2024-08-26 NOTE — Progress Notes (Signed)
 Daily Session Note  Patient Details  Name: Lauren Escobar MRN: [id number] Date of Birth: 1965-11-08 Referring Provider:   Flowsheet Row Pulmonary Rehab from 04/28/2024 in [facility] Cardiac and Pulmonary Rehab  Referring Provider [name] MD    Encounter Date: 08/26/2024  Check In:  Session Check In - 08/26/24 1110       Check-In   Supervising physician immediately available to respond to emergencies See telemetry face sheet for immediately available ER MD    Location [facility]-Cardiac & Pulmonary Rehab    Staff Present Fairy Plater RCP,RRT,BSRT;Maxon Conetta BS, Exercise Physiologist;Wilder Kurowski RN,BSN;Margaret Best, MS, Exercise Physiologist;Noah Tickle, BS, Exercise Physiologist    Virtual Visit No    Medication changes reported     No    Fall or balance concerns reported    No    Tobacco Cessation No Change    Warm-up and Cool-down Performed on first and last piece of equipment    Resistance Training Performed Yes    VAD Patient? No    PAD/SET Patient? No      Pain Assessment   Currently in Pain? No/denies             Social History   Tobacco Use  Smoking Status Former   Current packs/day: 0.00   Average packs/day: 2.8 packs/day for 29.0 years (79.8 ttl pk-yrs)   Types: Cigarettes   Start date: 10/1991   Quit date: 10/2020   Years since quitting: 3.8  Smokeless Tobacco Never    Goals Met:  Proper associated with RPD/PD & O2 Sat Independence with exercise equipment Using PLB without cueing & demonstrates good technique Exercise tolerated well No report of concerns or symptoms today Strength training completed today  Goals Unmet:  Not Applicable  Comments: Pt able to follow exercise prescription today without complaint.  Will continue to monitor for progression.    Dr. Oneil Pinal is Medical Director for [facility] Cardiac Rehabilitation.  Dr. Fuad Aleskerov is Medical Director for [facility] Pulmonary Rehabilitation.

## 2024-08-30 ENCOUNTER — Ambulatory Visit
Admission: RE | Admit: 2024-08-30 | Discharge: 2024-08-30 | Disposition: A | Discharge status: Home / Self Care | Source: Ambulatory Visit | Attending: Student in an Organized Health Care Education/Training Program | Admitting: Student in an Organized Health Care Education/Training Program

## 2024-08-30 DIAGNOSIS — Z720 Tobacco use: Secondary | ICD-10-CM | POA: Diagnosis not present

## 2024-08-30 DIAGNOSIS — R0609 Other forms of dyspnea: Secondary | ICD-10-CM | POA: Diagnosis not present

## 2024-08-30 DIAGNOSIS — I10 Essential (primary) hypertension: Secondary | ICD-10-CM | POA: Diagnosis not present

## 2024-08-30 DIAGNOSIS — J432 Centrilobular emphysema: Secondary | ICD-10-CM | POA: Insufficient documentation

## 2024-08-30 DIAGNOSIS — R0600 Dyspnea, unspecified: Secondary | ICD-10-CM | POA: Diagnosis present

## 2024-08-30 LAB — ECHOCARDIOGRAM COMPLETE
AR max vel: 2.67 cm2
AV Area VTI: 3.34 cm2
AV Area mean vel: 2.34 cm2
AV Mean grad: 2 mmHg
AV Peak grad: 2.4 mmHg
Ao pk vel: 0.78 m/s
Area-P 1/2: 6.9 cm2
MV VTI: 2.29 cm2
S' Lateral: 2.46 cm

## 2024-08-30 NOTE — Progress Notes (Signed)
*  PRELIMINARY RESULTS* Echocardiogram 2D Echocardiogram has been performed.  Lauren Escobar 08/30/2024, 10:15 AM

## 2024-08-31 ENCOUNTER — Encounter: Attending: Student in an Organized Health Care Education/Training Program | Admitting: *Deleted

## 2024-08-31 DIAGNOSIS — R0602 Shortness of breath: Secondary | ICD-10-CM | POA: Diagnosis present

## 2024-08-31 DIAGNOSIS — J432 Centrilobular emphysema: Secondary | ICD-10-CM | POA: Diagnosis present

## 2024-08-31 NOTE — Progress Notes (Signed)
 Daily Session Note  Patient Details  Name: Lauren Escobar MRN: [id number] Date of Birth: Dec 05, 1965 Referring Provider:   Flowsheet Row Pulmonary Rehab from 04/28/2024 in [facility] Cardiac and Pulmonary Rehab  Referring Provider [name] MD    Encounter Date: 08/31/2024  Check In:  Session Check In - 08/31/24 1315       Check-In   Supervising physician immediately available to respond to emergencies See telemetry face sheet for immediately available ER MD    Location [facility]-Cardiac & Pulmonary Rehab    Staff Present Ronal [place]ell [place], RN, BSN, MA;Maxon Burnell HECKLE, Exercise Physiologist;Meredith Tressa RN,BSN;Jason Elnor [facility]    Virtual Visit No    Medication changes reported     No    Fall or balance concerns reported    No    Tobacco Cessation No Change    Resistance Training Performed Yes    VAD Patient? No    PAD/SET Patient? No      Pain Assessment   Currently in Pain? No/denies             Social History   Tobacco Use  Smoking Status Former   Current packs/day: 0.00   Average packs/day: 2.8 packs/day for 29.0 years (79.8 ttl pk-yrs)   Types: Cigarettes   Start date: 10/1991   Quit date: 10/2020   Years since quitting: 3.8  Smokeless Tobacco Never    Goals Met:  Independence with exercise equipment Exercise tolerated well No report of concerns or symptoms today  Goals Unmet:  Not Applicable  Comments: Pt able to follow exercise prescription today without complaint.  Will continue to monitor for progression.    Dr. Oneil Pinal is Medical Director for [facility] Cardiac Rehabilitation.  Dr. Fuad Aleskerov is Medical Director for [facility] Pulmonary Rehabilitation.

## 2024-09-02 ENCOUNTER — Encounter: Admitting: Emergency Medicine

## 2024-09-02 VITALS — Ht 61.5 in | Wt 159.9 lb

## 2024-09-02 DIAGNOSIS — J432 Centrilobular emphysema: Secondary | ICD-10-CM | POA: Diagnosis not present

## 2024-09-02 DIAGNOSIS — R0602 Shortness of breath: Secondary | ICD-10-CM

## 2024-09-02 NOTE — Progress Notes (Signed)
 Daily Session Note  Patient Details  Name: Lauren Escobar MRN: [id number] Date of Birth: 1966-07-07 Referring Provider:   Flowsheet Row Pulmonary Rehab from 04/28/2024 in [facility] Cardiac and Pulmonary Rehab  Referring Provider [name] MD    Encounter Date: 09/02/2024  Check In:      Social History   Tobacco Use  Smoking Status Former   Current packs/day: 0.00   Average packs/day: 2.8 packs/day for 29.0 years (79.8 ttl pk-yrs)   Types: Cigarettes   Start date: 10/1991   Quit date: 10/2020   Years since quitting: 3.8  Smokeless Tobacco Never    Goals Met:  Proper associated with RPD/PD & O2 Sat Independence with exercise equipment Exercise tolerated well No report of concerns or symptoms today Strength training completed today  Goals Unmet:  Not Applicable  Comments:   6 Minute Walk     Row Name 04/28/24 1538 09/02/24 1118       6 Minute Walk   Phase Initial Discharge    Distance 1170 feet 1300 feet    Distance % Change -- 11 %    Distance Feet Change -- 130 ft    Walk Time 6 minutes 6 minutes    # of Rest Breaks 0 0    MPH 2.2 2.46    METS 3.13 3.39    RPE 19 13    Perceived Dyspnea  2 1    VO2 Peak -- 11.88    Symptoms Yes (comment) No    Comments Chest pain 9/10 and resolved to a 7/10 at rest --    Resting HR 93 bpm 97 bpm    Resting BP 100/70 104/60    Resting Oxygen Saturation  96 % 96 %    Exercise Oxygen Saturation  during 6 min walk 91 % 96 %    Max Ex. HR 117 bpm 114 bpm    Max Ex. BP 120/78 128/60    2 Minute Post BP 92/70 104/62      Interval HR   1 Minute HR 100 108    2 Minute HR 117 114    3 Minute HR 112 107    4 Minute HR 110 109    5 Minute HR 87 111    6 Minute HR 95 114    2 Minute Post HR 91 95    Interval Heart Rate? Yes Yes      Interval Oxygen   Interval Oxygen? Yes Yes    Baseline Oxygen Saturation % 96 % 96 %    1 Minute Oxygen Saturation % 91 % 96 %    1 Minute Liters of Oxygen 0 L 0 L    2 Minute Oxygen  Saturation % 91 % 96 %    2 Minute Liters of Oxygen 0 L 0 L    3 Minute Oxygen Saturation % 94 % 96 %    3 Minute Liters of Oxygen 0 L 0 L    4 Minute Oxygen Saturation % 94 % 97 %    4 Minute Liters of Oxygen 0 L 0 L    5 Minute Oxygen Saturation % 94 % 97 %    5 Minute Liters of Oxygen 0 L 0 L    6 Minute Oxygen Saturation % 93 % 98 %    6 Minute Liters of Oxygen 0 L 0 L    2 Minute Post Oxygen Saturation % 98 % 98 %    2 Minute Post Liters  of Oxygen 0 L 0 L      Pt able to follow exercise prescription today without complaint.  Will continue to monitor for progression.    Dr. Oneil Pinal is Medical Director for [facility] Cardiac Rehabilitation.  Dr. Fuad Aleskerov is Medical Director for [facility] Pulmonary Rehabilitation.

## 2024-09-02 NOTE — Patient Instructions (Addendum)
 Discharge Patient Instructions  Patient Details  Name: Lauren Escobar MRN: [id number] Date of Birth: 1966/01/23 Referring Provider:  Kandis Stefano Iles, MD  Number of Visits: 36/36  Reason for Discharge:  Patient reached a stable level of exercise. Patient independent in their exercise. Patient has met program and personal goals.  Diagnosis:  Centrilobular emphysema (HCC)  Shortness of breath  Initial Exercise Prescription:  Initial Exercise Prescription - 04/28/24 1500       Date of Initial Exercise RX and Referring Provider   Date 04/28/24    Referring Provider [name] MD      Oxygen   Maintain Oxygen Saturation 88% or higher      Recumbant Bike   Level 2    RPM 50    Watts 25    Minutes 15    METs 3.13      NuStep   Level 2    SPM 80    Minutes 15    METs 3.13      REL-XR   Level 1    Speed 50    Minutes 15    METs 3.13      T5 Nustep   Level 2    SPM 80    Minutes 15    METs 3.13      Track   Laps 26    Minutes 15    METs 2.41      Prescription Details   Frequency (times per week) 2    Duration Progress to 30 minutes of continuous aerobic without signs/symptoms of physical distress      Intensity   THRR 40-80% of Max Heartrate 117-147    Ratings of Perceived Exertion 11-13    Perceived Dyspnea 0-4      Progression   Progression Continue to progress workloads to maintain intensity without signs/symptoms of physical distress.      Resistance Training   Training Prescription Yes    Weight 4 lb    Reps 10-15          Discharge Exercise Prescription (Final Exercise Prescription Changes):  Exercise Prescription Changes - 08/25/24 0700       Response to Exercise   Blood Pressure (Admit) 106/60    Blood Pressure (Exit) 114/62    Heart Rate (Admit) 115 bpm    Heart Rate (Exercise) 139 bpm    Heart Rate (Exit) 130 bpm    Oxygen Saturation (Admit) 98 %    Oxygen Saturation (Exercise) 96 %    Oxygen Saturation (Exit) 97  %    Rating of Perceived Exertion (Exercise) 15    Perceived Dyspnea (Exercise) 2    Symptoms none    Duration Continue with 30 min of aerobic exercise without signs/symptoms of physical distress.    Intensity THRR unchanged      Progression   Progression Continue to progress workloads to maintain intensity without signs/symptoms of physical distress.    Average METs 2.9      Resistance Training   Training Prescription Yes    Weight 4 lb    Reps 10-15      Interval Training   Interval Training No      Treadmill   MPH 2.2    Grade 0    Minutes 15    METs 2.69      REL-XR   Level 8    Minutes 15    METs 3.2      Home Exercise Plan   Plans to continue  exercise at Home (comment)   Walking at home on days away from rehab   Frequency Add 1 additional day to program exercise sessions.    Initial Home Exercises Provided 07/13/24      Oxygen   Maintain Oxygen Saturation 88% or higher         Functional Capacity:  6 Minute Walk     Row Name 04/28/24 1538 09/02/24 1118       6 Minute Walk   Phase Initial Discharge    Distance 1170 feet 1300 feet    Distance % Change -- 11 %    Distance Feet Change -- 130 ft    Walk Time 6 minutes 6 minutes    # of Rest Breaks 0 0    MPH 2.2 2.46    METS 3.13 3.39    RPE 19 13    Perceived Dyspnea  2 1    VO2 Peak -- 11.88    Symptoms Yes (comment) No    Comments Chest pain 9/10 and resolved to a 7/10 at rest --    Resting HR 93 bpm 97 bpm    Resting BP 100/70 104/60    Resting Oxygen Saturation  96 % 96 %    Exercise Oxygen Saturation  during 6 min walk 91 % 96 %    Max Ex. HR 117 bpm 114 bpm    Max Ex. BP 120/78 128/60    2 Minute Post BP 92/70 104/62      Interval HR   1 Minute HR 100 108    2 Minute HR 117 114    3 Minute HR 112 107    4 Minute HR 110 109    5 Minute HR 87 111    6 Minute HR 95 114    2 Minute Post HR 91 95    Interval Heart Rate? Yes Yes      Interval Oxygen   Interval Oxygen? Yes Yes     Baseline Oxygen Saturation % 96 % 96 %    1 Minute Oxygen Saturation % 91 % 96 %    1 Minute Liters of Oxygen 0 L 0 L    2 Minute Oxygen Saturation % 91 % 96 %    2 Minute Liters of Oxygen 0 L 0 L    3 Minute Oxygen Saturation % 94 % 96 %    3 Minute Liters of Oxygen 0 L 0 L    4 Minute Oxygen Saturation % 94 % 97 %    4 Minute Liters of Oxygen 0 L 0 L    5 Minute Oxygen Saturation % 94 % 97 %    5 Minute Liters of Oxygen 0 L 0 L    6 Minute Oxygen Saturation % 93 % 98 %    6 Minute Liters of Oxygen 0 L 0 L    2 Minute Post Oxygen Saturation % 98 % 98 %    2 Minute Post Liters of Oxygen 0 L 0 L      Nutrition & Weight - Outcomes:  Pre Biometrics - 04/28/24 1546       Pre Biometrics   Height 5' 1.5 (1.562 m)    Weight 165 lb 12.8 oz (75.2 kg)    Waist Circumference 42.5 inches    Hip Circumference 42 inches    Waist to Hip Ratio 1.01 %    BMI (Calculated) 30.82    Single Leg Stand 1 seconds  Post Biometrics - 09/02/24 1123        Post  Biometrics   Height 5' 1.5 (1.562 m)    Weight 159 lb 14.4 oz (72.5 kg)    Waist Circumference 41 inches    Hip Circumference 40 inches    Waist to Hip Ratio 1.03 %    BMI (Calculated) 29.73    Single Leg Stand 1 seconds

## 2024-09-07 ENCOUNTER — Encounter

## 2024-09-07 DIAGNOSIS — R0602 Shortness of breath: Secondary | ICD-10-CM

## 2024-09-07 DIAGNOSIS — J432 Centrilobular emphysema: Secondary | ICD-10-CM

## 2024-09-07 NOTE — Progress Notes (Signed)
 Daily Session Note  Patient Details  Name: Lauren Escobar MRN: [id number] Date of Birth: June 25, 1966 Referring Provider:   Flowsheet Row Pulmonary Rehab from 04/28/2024 in [facility] Cardiac and Pulmonary Rehab  Referring Provider [name] MD    Encounter Date: 09/07/2024  Check In:  Session Check In - 09/07/24 1103       Check-In   Supervising physician immediately available to respond to emergencies See telemetry face sheet for immediately available ER MD    Location [facility]-Cardiac & Pulmonary Rehab    Staff Present Selinda Pereyra RDN,LDN;Noah Tickle, BS, Exercise Physiologist;Maxon Burnell HECKLE, Exercise Physiologist;Maisen Klingler RN,BSN,MPA;Mary Godley, RN, DNP, NE-BC    Virtual Visit No    Medication changes reported     No    Fall or balance concerns reported    No    Tobacco Cessation No Change    Warm-up and Cool-down Performed on first and last piece of equipment    Resistance Training Performed Yes    VAD Patient? No    PAD/SET Patient? No      Pain Assessment   Currently in Pain? No/denies             Social History   Tobacco Use  Smoking Status Former   Current packs/day: 0.00   Average packs/day: 2.8 packs/day for 29.0 years (79.8 ttl pk-yrs)   Types: Cigarettes   Start date: 10/1991   Quit date: 10/2020   Years since quitting: 3.8  Smokeless Tobacco Never    Goals Met:  Proper associated with RPD/PD & O2 Sat Independence with exercise equipment Using PLB without cueing & demonstrates good technique Exercise tolerated well No report of concerns or symptoms today Strength training completed today  Goals Unmet:  Not Applicable  Comments: Pt able to follow exercise prescription today without complaint.  Will continue to monitor for progression.    Dr. Oneil Pinal is Medical Director for [facility] Cardiac Rehabilitation.  Dr. Fuad Aleskerov is Medical Director for [facility] Pulmonary Rehabilitation.

## 2024-09-09 ENCOUNTER — Encounter: Admitting: Emergency Medicine

## 2024-09-09 DIAGNOSIS — J432 Centrilobular emphysema: Secondary | ICD-10-CM | POA: Diagnosis not present

## 2024-09-09 NOTE — Progress Notes (Signed)
 Daily Session Note  Patient Details  Name: Lauren Escobar MRN: [id number] Date of Birth: [birth year] Referring Provider:   Flowsheet Row Pulmonary Rehab from 04/28/2024 in [facility] Cardiac and Pulmonary Rehab  Referring Provider [name] MD    Encounter Date: 09/09/2024  Check In:  Session Check In - 09/09/24 1104       Check-In   Supervising physician immediately available to respond to emergencies See telemetry face sheet for immediately available ER MD    Location [facility]-Cardiac & Pulmonary Rehab    Staff Present Leita Franks RN,BSN;[facility] [place], [place], Exercise Physiologist;Jason Elnor RDN,LDN    Virtual Visit No    Medication changes reported     No    Fall or balance concerns reported    No    Tobacco Cessation No Change    Warm-up and Cool-down Performed on first and last piece of equipment    Resistance Training Performed Yes    VAD Patient? No    PAD/SET Patient? No      Pain Assessment   Currently in Pain? No/denies             Social History   Tobacco Use  Smoking Status Former   Current packs/day: 0.00   Average packs/day: 2.8 packs/day for 29.0 years (79.8 ttl pk-yrs)   Types: Cigarettes   Start date: 10/1991   Quit date: 10/2020   Years since quitting: 3.8  Smokeless Tobacco Never    Goals Met:  Proper associated with RPD/PD & O2 Sat Independence with exercise equipment Using PLB without cueing & demonstrates good technique Exercise tolerated well No report of concerns or symptoms today Strength training completed today  Goals Unmet:  Not Applicable  Comments: Pt able to follow exercise prescription today without complaint.  Will continue to monitor for progression.    Dr. Oneil Pinal is Medical Director for [facility] Cardiac Rehabilitation.  Dr. Fuad Aleskerov is Medical Director for [facility] Pulmonary Rehabilitation.

## 2024-09-14 ENCOUNTER — Inpatient Hospital Stay
Admission: EM | Admit: 2024-09-14 | Discharge: 2024-09-17 | DRG: 191 | Disposition: A | Discharge status: Home / Self Care | Attending: Student | Admitting: Student

## 2024-09-14 ENCOUNTER — Other Ambulatory Visit: Payer: Self-pay

## 2024-09-14 ENCOUNTER — Emergency Department

## 2024-09-14 ENCOUNTER — Encounter

## 2024-09-14 DIAGNOSIS — Z79899 Other long term (current) drug therapy: Secondary | ICD-10-CM | POA: Diagnosis not present

## 2024-09-14 DIAGNOSIS — Z885 Allergy status to narcotic agent status: Secondary | ICD-10-CM

## 2024-09-14 DIAGNOSIS — I10 Essential (primary) hypertension: Secondary | ICD-10-CM | POA: Diagnosis present

## 2024-09-14 DIAGNOSIS — Z96612 Presence of left artificial shoulder joint: Secondary | ICD-10-CM | POA: Diagnosis present

## 2024-09-14 DIAGNOSIS — J9601 Acute respiratory failure with hypoxia: Secondary | ICD-10-CM | POA: Diagnosis present

## 2024-09-14 DIAGNOSIS — E119 Type 2 diabetes mellitus without complications: Secondary | ICD-10-CM | POA: Diagnosis present

## 2024-09-14 DIAGNOSIS — Z886 Allergy status to analgesic agent status: Secondary | ICD-10-CM

## 2024-09-14 DIAGNOSIS — D6800 Von Willebrand disease, unspecified: Secondary | ICD-10-CM | POA: Diagnosis present

## 2024-09-14 DIAGNOSIS — J439 Emphysema, unspecified: Secondary | ICD-10-CM | POA: Diagnosis present

## 2024-09-14 DIAGNOSIS — Z87891 Personal history of nicotine dependence: Secondary | ICD-10-CM

## 2024-09-14 DIAGNOSIS — D45 Polycythemia vera: Secondary | ICD-10-CM | POA: Diagnosis present

## 2024-09-14 DIAGNOSIS — K219 Gastro-esophageal reflux disease without esophagitis: Secondary | ICD-10-CM | POA: Diagnosis present

## 2024-09-14 DIAGNOSIS — J441 Chronic obstructive pulmonary disease with (acute) exacerbation: Secondary | ICD-10-CM | POA: Diagnosis present

## 2024-09-14 DIAGNOSIS — Z1152 Encounter for screening for COVID-19: Secondary | ICD-10-CM

## 2024-09-14 DIAGNOSIS — R0602 Shortness of breath: Secondary | ICD-10-CM | POA: Diagnosis present

## 2024-09-14 DIAGNOSIS — Z96652 Presence of left artificial knee joint: Secondary | ICD-10-CM | POA: Diagnosis present

## 2024-09-14 DIAGNOSIS — R079 Chest pain, unspecified: Principal | ICD-10-CM

## 2024-09-14 DIAGNOSIS — Z90710 Acquired absence of both cervix and uterus: Secondary | ICD-10-CM

## 2024-09-14 DIAGNOSIS — E109 Type 1 diabetes mellitus without complications: Secondary | ICD-10-CM

## 2024-09-14 DIAGNOSIS — F32A Depression, unspecified: Secondary | ICD-10-CM | POA: Diagnosis present

## 2024-09-14 DIAGNOSIS — Z888 Allergy status to other drugs, medicaments and biological substances status: Secondary | ICD-10-CM | POA: Diagnosis not present

## 2024-09-14 DIAGNOSIS — Z833 Family history of diabetes mellitus: Secondary | ICD-10-CM

## 2024-09-14 DIAGNOSIS — J432 Centrilobular emphysema: Secondary | ICD-10-CM

## 2024-09-14 DIAGNOSIS — F419 Anxiety disorder, unspecified: Secondary | ICD-10-CM | POA: Diagnosis present

## 2024-09-14 DIAGNOSIS — E785 Hyperlipidemia, unspecified: Secondary | ICD-10-CM | POA: Diagnosis present

## 2024-09-14 DIAGNOSIS — J449 Chronic obstructive pulmonary disease, unspecified: Secondary | ICD-10-CM | POA: Diagnosis present

## 2024-09-14 LAB — RESP PANEL BY RT-PCR (RSV, FLU A&B, COVID)  RVPGX2
Influenza A by PCR: NEGATIVE
Influenza B by PCR: NEGATIVE
Resp Syncytial Virus by PCR: NEGATIVE
SARS Coronavirus 2 by RT PCR: NEGATIVE

## 2024-09-14 LAB — CBC
HCT: 37.9 % (ref 36.0–46.0)
Hemoglobin: 13.5 g/dL (ref 12.0–15.0)
MCH: 36.6 pg — ABNORMAL HIGH (ref 26.0–34.0)
MCHC: 35.6 g/dL (ref 30.0–36.0)
MCV: 102.7 fL — ABNORMAL HIGH (ref 80.0–100.0)
Platelets: 278 K/uL (ref 150–400)
RBC: 3.69 MIL/uL — ABNORMAL LOW (ref 3.87–5.11)
RDW: 12.8 % (ref 11.5–15.5)
WBC: 8.9 K/uL (ref 4.0–10.5)
nRBC: 0 % (ref 0.0–0.2)

## 2024-09-14 LAB — BASIC METABOLIC PANEL WITH GFR
Anion gap: 13 (ref 5–15)
BUN: 11 mg/dL (ref 6–20)
CO2: 22 mmol/L (ref 22–32)
Calcium: 9.4 mg/dL (ref 8.9–10.3)
Chloride: 103 mmol/L (ref 98–111)
Creatinine, Ser: 0.62 mg/dL (ref 0.44–1.00)
GFR, Estimated: >60 mL/min (ref >60)
Glucose, Bld: 103 mg/dL — ABNORMAL HIGH (ref 70–99)
Potassium: 3.7 mmol/L (ref 3.5–5.1)
Sodium: 137 mmol/L (ref 135–145)

## 2024-09-14 LAB — LIPID PANEL
Cholesterol: 183 mg/dL (ref 0–200)
HDL: 46 mg/dL (ref >40)
LDL Cholesterol: 93 mg/dL (ref 0–99)
Total CHOL/HDL Ratio: 4 ratio
Triglycerides: 218 mg/dL — ABNORMAL HIGH (ref <150)
VLDL: 44 mg/dL — ABNORMAL HIGH (ref 0–40)

## 2024-09-14 LAB — TROPONIN T, HIGH SENSITIVITY
Troponin T High Sensitivity: <15 ng/L (ref 0–19)
Troponin T High Sensitivity: <15 ng/L (ref 0–19)
Troponin T High Sensitivity: <15 ng/L (ref 0–19)

## 2024-09-14 LAB — GLUCOSE, CAPILLARY: Glucose-Capillary: 183 mg/dL — ABNORMAL HIGH (ref 70–99)

## 2024-09-14 LAB — D-DIMER, QUANTITATIVE: D-Dimer, Quant: 0.33 ug{FEU}/mL (ref 0.00–0.50)

## 2024-09-14 LAB — TSH: TSH: 1.43 u[IU]/mL (ref 0.350–4.500)

## 2024-09-14 MED ORDER — BISACODYL 5 MG PO TBEC
5.0000 mg | DELAYED_RELEASE_TABLET | Freq: Every day | ORAL | Status: DC | PRN
  Administered 2024-09-16: 5 mg via ORAL
  Filled 2024-09-14: qty 1

## 2024-09-14 MED ORDER — MORPHINE SULFATE (PF) 2 MG/ML IV SOLN
INTRAVENOUS | Status: AC
  Filled 2024-09-14: qty 1

## 2024-09-14 MED ORDER — BUDESON-GLYCOPYRROL-FORMOTEROL 160-9-4.8 MCG/ACT IN AERO
2.0000 | INHALATION_SPRAY | Freq: Two times a day (BID) | RESPIRATORY_TRACT | Status: DC
  Administered 2024-09-15 – 2024-09-17 (×5): 2 via RESPIRATORY_TRACT
  Filled 2024-09-14 (×2): qty 5.9

## 2024-09-14 MED ORDER — ONDANSETRON HCL 4 MG/2ML IJ SOLN: 4.0000 mg | Freq: Four times a day (QID) | INTRAMUSCULAR | Status: DC | PRN

## 2024-09-14 MED ORDER — BUSPIRONE HCL 15 MG PO TABS
7.5000 mg | ORAL_TABLET | Freq: Three times a day (TID) | ORAL | Status: DC
  Administered 2024-09-14 – 2024-09-17 (×8): 7.5 mg via ORAL
  Filled 2024-09-14 (×9): qty 1

## 2024-09-14 MED ORDER — ASPIRIN 81 MG PO TBEC
81.0000 mg | DELAYED_RELEASE_TABLET | Freq: Every day | ORAL | Status: DC
  Administered 2024-09-15 – 2024-09-17 (×3): 81 mg via ORAL
  Filled 2024-09-14 (×3): qty 1

## 2024-09-14 MED ORDER — HYDRALAZINE HCL 20 MG/ML IJ SOLN: 5.0000 mg | Freq: Four times a day (QID) | INTRAMUSCULAR | Status: DC | PRN

## 2024-09-14 MED ORDER — CYCLOBENZAPRINE HCL 10 MG PO TABS
10.0000 mg | ORAL_TABLET | Freq: Every day | ORAL | Status: DC
  Administered 2024-09-15 – 2024-09-16 (×2): 10 mg via ORAL
  Filled 2024-09-14 (×2): qty 1

## 2024-09-14 MED ORDER — METHYLPREDNISOLONE SODIUM SUCC 125 MG IJ SOLR
125.0000 mg | Freq: Once | INTRAMUSCULAR | Status: AC
  Administered 2024-09-14: 125 mg via INTRAVENOUS
  Filled 2024-09-14: qty 2

## 2024-09-14 MED ORDER — ENOXAPARIN SODIUM 40 MG/0.4ML IJ SOSY: 40.0000 mg | PREFILLED_SYRINGE | INTRAMUSCULAR | Status: DC

## 2024-09-14 MED ORDER — INSULIN ASPART 100 UNIT/ML IJ SOLN: 0.0000 [IU] | Freq: Every day | INTRAMUSCULAR | Status: DC

## 2024-09-14 MED ORDER — ALBUTEROL SULFATE (2.5 MG/3ML) 0.083% IN NEBU
10.0000 mg/h | INHALATION_SOLUTION | Freq: Once | RESPIRATORY_TRACT | Status: AC
  Administered 2024-09-14: 10 mg/h via RESPIRATORY_TRACT
  Filled 2024-09-14: qty 9

## 2024-09-14 MED ORDER — PROPRANOLOL HCL 10 MG PO TABS
ORAL_TABLET | ORAL | Status: AC
  Filled 2024-09-14: qty 4

## 2024-09-14 MED ORDER — SODIUM CHLORIDE 0.9 % IV SOLN: INTRAVENOUS | Status: DC

## 2024-09-14 MED ORDER — LORATADINE 10 MG PO TABS
10.0000 mg | ORAL_TABLET | Freq: Every day | ORAL | Status: DC
  Administered 2024-09-15 – 2024-09-17 (×3): 10 mg via ORAL
  Filled 2024-09-14 (×3): qty 1

## 2024-09-14 MED ORDER — ACETAMINOPHEN 325 MG PO TABS: 650.0000 mg | ORAL_TABLET | Freq: Four times a day (QID) | ORAL | Status: DC | PRN

## 2024-09-14 MED ORDER — PANTOPRAZOLE SODIUM 40 MG PO TBEC
40.0000 mg | DELAYED_RELEASE_TABLET | Freq: Every day | ORAL | Status: DC
  Administered 2024-09-15 – 2024-09-17 (×3): 40 mg via ORAL
  Filled 2024-09-14 (×3): qty 1

## 2024-09-14 MED ORDER — SERTRALINE HCL 50 MG PO TABS
100.0000 mg | ORAL_TABLET | Freq: Every day | ORAL | Status: DC
  Administered 2024-09-15 – 2024-09-17 (×3): 100 mg via ORAL
  Filled 2024-09-14 (×3): qty 2

## 2024-09-14 MED ORDER — MORPHINE SULFATE (PF) 2 MG/ML IV SOLN
1.0000 mg | Freq: Four times a day (QID) | INTRAVENOUS | Status: DC | PRN
  Administered 2024-09-14 – 2024-09-16 (×4): 1 mg via INTRAVENOUS
  Filled 2024-09-14 (×3): qty 1

## 2024-09-14 MED ORDER — ACETAMINOPHEN 650 MG RE SUPP: 650.0000 mg | Freq: Four times a day (QID) | RECTAL | Status: DC | PRN

## 2024-09-14 MED ORDER — HYDROCODONE-ACETAMINOPHEN 5-325 MG PO TABS
1.0000 | ORAL_TABLET | ORAL | Status: DC | PRN
  Administered 2024-09-15 – 2024-09-17 (×7): 2 via ORAL
  Filled 2024-09-14 (×7): qty 2

## 2024-09-14 MED ORDER — HYDROXYUREA 500 MG PO CAPS
500.0000 mg | ORAL_CAPSULE | Freq: Two times a day (BID) | ORAL | Status: DC
  Administered 2024-09-14 – 2024-09-17 (×6): 500 mg via ORAL
  Filled 2024-09-14 (×8): qty 1

## 2024-09-14 MED ORDER — ATORVASTATIN CALCIUM 20 MG PO TABS
20.0000 mg | ORAL_TABLET | Freq: Every day | ORAL | Status: DC
  Administered 2024-09-15 – 2024-09-16 (×2): 20 mg via ORAL
  Filled 2024-09-14 (×2): qty 1

## 2024-09-14 MED ORDER — INSULIN ASPART 100 UNIT/ML IJ SOLN
0.0000 [IU] | Freq: Three times a day (TID) | INTRAMUSCULAR | Status: DC
  Administered 2024-09-15 (×2): 4 [IU] via SUBCUTANEOUS
  Administered 2024-09-15 – 2024-09-16 (×2): 3 [IU] via SUBCUTANEOUS
  Administered 2024-09-16: 4 [IU] via SUBCUTANEOUS
  Filled 2024-09-14 (×2): qty 3
  Filled 2024-09-14 (×3): qty 4

## 2024-09-14 MED ORDER — IPRATROPIUM-ALBUTEROL 0.5-2.5 (3) MG/3ML IN SOLN
3.0000 mL | Freq: Four times a day (QID) | RESPIRATORY_TRACT | Status: DC
  Administered 2024-09-15 (×2): 3 mL via RESPIRATORY_TRACT
  Filled 2024-09-14: qty 3

## 2024-09-14 MED ORDER — KETOROLAC TROMETHAMINE 30 MG/ML IJ SOLN
15.0000 mg | Freq: Once | INTRAMUSCULAR | Status: AC
  Administered 2024-09-14: 15 mg via INTRAVENOUS
  Filled 2024-09-14: qty 1

## 2024-09-14 MED ORDER — ADULT MULTIVITAMIN LIQUID CH
Freq: Every day | ORAL | Status: DC
  Administered 2024-09-15 – 2024-09-17 (×3): 15 mL via ORAL
  Filled 2024-09-14 (×3): qty 15

## 2024-09-14 MED ORDER — SODIUM CHLORIDE 0.9% FLUSH
3.0000 mL | Freq: Two times a day (BID) | INTRAVENOUS | Status: DC
  Administered 2024-09-14 – 2024-09-17 (×6): 3 mL via INTRAVENOUS

## 2024-09-14 MED ORDER — ENOXAPARIN SODIUM 40 MG/0.4ML IJ SOSY
PREFILLED_SYRINGE | INTRAMUSCULAR | Status: DC
Start: 2024-09-14 — End: 2024-09-14
  Filled 2024-09-14: qty 0.4

## 2024-09-14 MED ORDER — ISOSORBIDE MONONITRATE ER 30 MG PO TB24
30.0000 mg | ORAL_TABLET | Freq: Every day | ORAL | Status: DC
  Administered 2024-09-15 – 2024-09-17 (×3): 30 mg via ORAL
  Filled 2024-09-14 (×3): qty 1

## 2024-09-14 MED ORDER — FOLIC ACID 1 MG PO TABS
1.0000 mg | ORAL_TABLET | Freq: Every day | ORAL | Status: DC
  Administered 2024-09-15 – 2024-09-17 (×3): 1 mg via ORAL
  Filled 2024-09-14 (×3): qty 1

## 2024-09-14 MED ORDER — IPRATROPIUM-ALBUTEROL 0.5-2.5 (3) MG/3ML IN SOLN
6.0000 mL | Freq: Once | RESPIRATORY_TRACT | Status: AC
  Administered 2024-09-14: 6 mL via RESPIRATORY_TRACT
  Filled 2024-09-14: qty 6

## 2024-09-14 MED ORDER — SENNOSIDES-DOCUSATE SODIUM 8.6-50 MG PO TABS: 1.0000 | ORAL_TABLET | Freq: Every evening | ORAL | Status: DC | PRN

## 2024-09-14 MED ORDER — METHYLPREDNISOLONE SODIUM SUCC 125 MG IJ SOLR
80.0000 mg | INTRAMUSCULAR | Status: DC
  Administered 2024-09-15: 80 mg via INTRAVENOUS
  Filled 2024-09-14: qty 2

## 2024-09-14 MED ORDER — BENZONATATE 100 MG PO CAPS
100.0000 mg | ORAL_CAPSULE | Freq: Three times a day (TID) | ORAL | Status: DC | PRN
Start: 2024-09-14 — End: 2024-09-17
  Administered 2024-09-17: 100 mg via ORAL
  Filled 2024-09-14: qty 1

## 2024-09-14 MED ORDER — PROPRANOLOL HCL 10 MG PO TABS
40.0000 mg | ORAL_TABLET | Freq: Two times a day (BID) | ORAL | Status: DC
  Administered 2024-09-14 – 2024-09-17 (×6): 40 mg via ORAL
  Filled 2024-09-14 (×5): qty 4

## 2024-09-14 MED ORDER — MAGNESIUM SULFATE 2 GM/50ML IV SOLN
2.0000 g | Freq: Once | INTRAVENOUS | Status: AC
  Administered 2024-09-14: 2 g via INTRAVENOUS
  Filled 2024-09-14: qty 50

## 2024-09-14 MED ORDER — ONDANSETRON HCL 4 MG PO TABS: 4.0000 mg | ORAL_TABLET | Freq: Four times a day (QID) | ORAL | Status: DC | PRN

## 2024-09-14 NOTE — ED Triage Notes (Signed)
 Pt arrives via [facility] from home with c/o centralized CP that is radiating to their lower back on the left side that started this morning. Pt is also SOB and endorsing numbness and tingling in their arms and hands. Pt is A&Ox4 during triage.

## 2024-09-14 NOTE — ED Triage Notes (Signed)
 Ems from [place] drew.  Woke up this am and had chest pain center chest down left arm.  No hx cardiac.  Vss IV.  4 asa, 2 nitrosprays.

## 2024-09-14 NOTE — Progress Notes (Signed)
 PHARMACIST - PHYSICIAN COMMUNICATION   CONCERNING: Methylprednisolone  IV    Current order: Methylprednisolone  IV 40 mg IV q12h     DESCRIPTION: Per [facility] Protocol:   IV methylprednisolone  will be converted to either a q12h or q24h frequency with the same total daily dose (TDD).  Ordered Dose: 1 to 125 mg TDD; convert to: TDD q24h.  Ordered Dose: 126 to 250 mg TDD; convert to: TDD div q12h.  Ordered Dose: >250 mg TDD; DAW.  Order has been adjusted to: Methylprednisolone  IV 80mg  q24h   Kayla JULIANNA Blew , PharmD, BCPS Clinical Pharmacist  09/14/2024 8:58 PM

## 2024-09-14 NOTE — H&P (Addendum)
 History and Physical   [facility] [place] @ [facility] Admission History and Physical [employer], D.O.    Patient Name: Lauren Escobar MR#: [id number] Date of Birth: 07-Nov-1965 Date of Admission: 09/14/2024  Referring MD/NP/PA: Dr. Jossie Primary Care Physician: Lauren Stefano Iles, MD  Chief Complaint:  Chief Complaint  Patient presents with   Chest Pain    HPI: Lauren Escobar is a 58 y.o. female with a known history of anxiety, asthma, COPD, CKD, DM, depression, HTN, HLD, vonWillebrand's, polycythemia vera  presents to the emergency department for evaluation of SOB and central chest pain.  Patient was in a usual state of health until this morning she reports that she woke up and had sudden onset of centralized chest pain that radiates to the left side associated with shortness of breath numbness and tingling in her hands.  Symptoms were refractory to home nebulizer treatments.  At present she reports continued left upper back pain and shortness of breath.  She received 4 aspirin  and 2 sprays of nitroglycerin  via EMS.  Of note patient was lysed in August 2025 with metapneumovirus  Patient denies fevers/chills, weakness, dizziness,  N/V/C/D, abdominal pain, dysuria/frequency, changes in mental status.    Otherwise there has been no change in status. Patient has been taking medication as prescribed and there has been no recent change in medication or diet.  No recent antibiotics.  There has been no recent illness, travel or sick contacts.    EMS/ED Course: Patient received Toradol , methylprednisolone , magnesium , albuterol  and DuoNeb. Medical admission has been requested for further management of COPD exacerbation, chest pain rule out ACS.  Review of Systems:  CONSTITUTIONAL: No fever/chills, fatigue, weakness, weight gain/loss, headache. EYES: No blurry or double vision. ENT: No tinnitus, postnasal drip, redness or soreness of the oropharynx. RESPIRATORY:  Positive cough, dyspnea, wheeze.  No hemoptysis.  CARDIOVASCULAR: Positive chest pain, palpitations, syncope, orthopnea. No lower extremity edema.  GASTROINTESTINAL: No nausea, vomiting, abdominal pain, diarrhea, constipation.  No hematemesis, melena or hematochezia. GENITOURINARY: No dysuria, frequency, hematuria. ENDOCRINE: No polyuria or nocturia. No heat or cold intolerance. HEMATOLOGY: No anemia, bruising, bleeding. INTEGUMENTARY: No rashes, ulcers, lesions. MUSCULOSKELETAL: No arthritis, gout. NEUROLOGIC: No numbness, tingling, ataxia, seizure-type activity, weakness. PSYCHIATRIC: No anxiety, depression, insomnia.   Past Medical History:  Diagnosis Date   Abdominal pain    Abnormal mammogram    Anxiety    Asthma    Bleeding disorder    Blood dyscrasia    polycythemia vera   Chronic kidney disease    COPD (chronic obstructive pulmonary disease) (HCC)    Cough    Depression    Diabetes mellitus without complication (HCC)    Fatigue    Headache 2000   Horizontal nystagmus    age 63   Hyperglycemia    Hypertension    Low back pain    Major depressive disorder    Nasal lesion    Neck pain    Snoring    SOB (shortness of breath)    Tobacco use    UTI (urinary tract infection)    Von Willebrand's disease [facility])     Past Surgical History:  Procedure Laterality Date   ABDOMINAL HYSTERECTOMY  2002   oophorectomy   ABDOMINAL HYSTERECTOMY     APPENDECTOMY     BLADDER SURGERY     BREAST BIOPSY Left    NEG   CARDIAC CATHETERIZATION     CARDIAC CATHETERIZATION N/A 07/17/2016   Procedure: Left Heart Cath and Coronary  Angiography;  Surgeon: Wolm JINNY Rhyme, MD;  Location: [facility] INVASIVE CV LAB;  Service: Cardiovascular;  Laterality: N/A;   COLONOSCOPY WITH PROPOFOL  N/A 07/27/2015   Procedure: COLONOSCOPY WITH PROPOFOL ;  Surgeon: Lamar ONEIDA Holmes, MD;  Location: [facility] ENDOSCOPY;  Service: Endoscopy;  Laterality: N/A;   COLONOSCOPY WITH PROPOFOL  N/A 05/31/2019   Procedure:  COLONOSCOPY WITH PROPOFOL ;  Surgeon: Unk Corinn Skiff, MD;  Location: [facility] ENDOSCOPY;  Service: Gastroenterology;  Laterality: N/A;   ESOPHAGOGASTRODUODENOSCOPY N/A 06/18/2017   Procedure: ESOPHAGOGASTRODUODENOSCOPY (EGD);  Surgeon: Unk Corinn Skiff, MD;  Location: [facility] ENDOSCOPY;  Service: Gastroenterology;  Laterality: N/A;   ESOPHAGOGASTRODUODENOSCOPY (EGD) WITH PROPOFOL  N/A 05/31/2019   Procedure: ESOPHAGOGASTRODUODENOSCOPY (EGD) WITH PROPOFOL ;  Surgeon: Unk Corinn Skiff, MD;  Location: [facility] ENDOSCOPY;  Service: Gastroenterology;  Laterality: N/A;   REPLACEMENT TOTAL KNEE Left    SHOULDER SURGERY Left    x 2     reports that she quit smoking about 3 years ago. Her smoking use included cigarettes. She started smoking about 32 years ago. She has a 79.8 pack-year smoking history. She has never used smokeless tobacco. She reports that she does not drink alcohol and does not use drugs.  Allergies  Allergen Reactions   Aspirin      Able to tolerate low dose   Pregabalin Other (See Comments)    Unsteady gate, causing falls   Trazodone Other (See Comments)    weakness   Tramadol Rash    Family History  Problem Relation Age of Onset   Cancer Father        prostate   Stroke Father    Diabetes Father    Cancer Mother        bladder   Breast cancer Maternal Grandmother     Prior to Admission medications   Medication Sig Start Date End Date Taking? Authorizing Provider  albuterol  (PROVENTIL ) (2.5 MG/3ML) 0.083% nebulizer solution Take 3 mLs (2.5 mg total) by nebulization every 6 (six) hours as needed for wheezing or shortness of breath. 07/04/23   [name], MD  ARIPiprazole  (ABILIFY ) 2 MG tablet Take 2 mg by mouth daily. Patient not taking: Reported on 07/26/2024 07/24/23   [provider]  aspirin  EC 81 MG tablet Take 81 mg by mouth daily. 05/21/17 05/21/26  [provider]  atorvastatin  (LIPITOR) 20 MG tablet Take 20 mg by mouth at bedtime. 04/21/19   [provider]  benzonatate  (TESSALON ) 100 MG capsule Take 1 capsule (100 mg total) by mouth 3 (three) times daily as needed for cough. 04/13/24   Laurita Pillion, MD  busPIRone  (BUSPAR ) 7.5 MG tablet Take 7.5 mg by mouth 3 (three) times daily.    [provider]  cetirizine (ZYRTEC) 10 MG tablet Take 10 mg by mouth daily. 04/27/19   [provider]  esomeprazole (NEXIUM) 40 MG capsule Take 40 mg by mouth daily.  08/07/14   [provider]  Fluticasone -Umeclidin-Vilant (TRELEGY ELLIPTA ) 200-62.5-25 MCG/ACT AEPB Inhale 1 puff into the lungs daily. 04/08/24   [name], MD  folic acid  (FOLVITE ) 1 MG tablet Take 1 mg by mouth daily. 05/01/19   [provider]  hydroxyurea  (HYDREA ) 500 MG capsule Take 500 mg by mouth 2 (two) times daily. May take with food to minimize GI side effects.    [provider]  ipratropium-albuterol  (DUONEB) 0.5-2.5 (3) MG/3ML SOLN Take 3 mLs by nebulization every 6 (six) hours for 7 days. 04/13/24 07/26/24  Laurita Pillion, MD  isosorbide  mononitrate (IMDUR ) 30 MG 24  hr tablet Take 30 mg by mouth daily. 05/01/19   [provider]  mirtazapine  (REMERON ) 15 MG tablet Take 15 mg by mouth at bedtime. 11/05/23   [provider]  Multiple Vitamins-Minerals (MULTIVIT/MULTIMINERAL ADULT PO) Take 1 tablet by mouth daily.    [provider]  propranolol  (INDERAL ) 40 MG tablet Take 40 mg by mouth 2 (two) times daily. 05/24/19   [provider]    Physical Exam: Vitals:   09/14/24 1437 09/14/24 1442 09/14/24 1906  BP: 106/80  (!) 96/50  Pulse: (!) 114  (!) 106  Resp: 17  (!) 24  Temp: 97.7 F (36.5 C)  97.8 F (36.6 C)  TempSrc: Oral  Oral  SpO2: 100%  95%  Weight:  72.6 kg   Height:  4' 11 (1.499 m)     GENERAL: 58 y.o.-year-old white female patient, well-developed, well-nourished lying in the bed in mild dress HEENT: Head atraumatic, normocephalic. Pupils equal. Mucus membranes moist.  Edentulous NECK:  Supple. No JVD. CHEST: Normal breath sounds bilaterally. No wheezing, rales, rhonchi or crackles.  She is using accessory muscles to breathe, pursed lip breathing CARDIOVASCULAR: S1, S2 normal. No murmurs, rubs, or gallops. Cap refill <2 seconds. Pulses intact distally.  ABDOMEN: Soft, nondistended, nontender. No rebound, guarding, rigidity. Normoactive bowel sounds present in all four quadrants.  EXTREMITIES: No pedal edema, cyanosis, or clubbing. No calf tenderness or Homan's sign.  NEUROLOGIC: The patient is alert and oriented x 3. Cranial nerves II through XII are grossly intact with no focal sensorimotor deficit. PSYCHIATRIC:  Normal affect, mood, thought content. SKIN: Warm, dry, and intact without obvious rash, lesion, or ulcer.    Labs on Admission:  CBC: Recent Labs  Lab 09/14/24 1445  WBC 8.9  HGB 13.5  HCT 37.9  MCV 102.7*  PLT 278   Basic Metabolic Panel: Recent Labs  Lab 09/14/24 1445  NA 137  K 3.7  CL 103  CO2 22  GLUCOSE 103*  BUN 11  CREATININE 0.62  CALCIUM  9.4   GFR: Estimated Creatinine Clearance: 65.7 mL/min (by C-G formula based on SCr of 0.62 mg/dL). Liver Function Tests: No results for input(s): AST, ALT, ALKPHOS, BILITOT, PROT, ALBUMIN in the last 168 hours. No results for input(s): LIPASE, AMYLASE in the last 168 hours. No results for input(s): AMMONIA in the last 168 hours. Coagulation Profile: No results for input(s): INR, PROTIME in the last 168 hours. Cardiac Enzymes: No results for input(s): CKTOTAL, CKMB, CKMBINDEX, TROPONINI in the last 168 hours. BNP (last 3 results) No results for input(s): PROBNP in the last 8760 hours. HbA1C: No results for input(s): HGBA1C in the last 72 hours. CBG: No results for input(s): GLUCAP in the last 168 hours. Lipid Profile: No results for input(s): CHOL, HDL, LDLCALC, TRIG, CHOLHDL, LDLDIRECT in the last 72 hours. Thyroid Function Tests: No results  for input(s): TSH, T4TOTAL, FREET4, T3FREE, THYROIDAB in the last 72 hours. Anemia Panel: No results for input(s): VITAMINB12, FOLATE, FERRITIN, TIBC, IRON, RETICCTPCT in the last 72 hours. Urine analysis:    Component Value Date/Time   COLORURINE YELLOW (A) 03/11/2021 1758   APPEARANCEUR CLOUDY (A) 03/11/2021 1758   APPEARANCEUR Hazy 06/10/2014 1142   LABSPEC 1.028 03/11/2021 1758   LABSPEC 1.018 06/10/2014 1142   PHURINE 5.0 03/11/2021 1758   GLUCOSEU NEGATIVE 03/11/2021 1758   GLUCOSEU Negative 06/10/2014 1142   HGBUR LARGE (A) 03/11/2021 1758   BILIRUBINUR NEGATIVE 03/11/2021 1758   BILIRUBINUR Negative 06/10/2014 1142   KETONESUR  NEGATIVE 03/11/2021 1758   PROTEINUR 100 (A) 03/11/2021 1758   NITRITE NEGATIVE 03/11/2021 1758   LEUKOCYTESUR LARGE (A) 03/11/2021 1758   LEUKOCYTESUR 1+ 06/10/2014 1142   Sepsis Labs: @LABRCNTIP (procalcitonin:4,lacticidven:4) )No results found for this or any previous visit (from the past 240 hours).   Radiological Exams on Admission: DG Chest 2 View Result Date: 09/14/2024 EXAM: 2 VIEW(S) XRAY OF THE CHEST 09/14/2024 03:05:15 PM COMPARISON: Comparison 04/12/2024. CLINICAL HISTORY: CP. FINDINGS: LUNGS AND PLEURA: No focal pulmonary opacity. No pleural effusion. No pneumothorax. HEART AND MEDIASTINUM: No acute abnormality of the cardiac and mediastinal silhouettes. BONES AND SOFT TISSUES: No acute osseous abnormality. IMPRESSION: 1. No acute cardiopulmonary process. Electronically signed by: Lynwood Seip MD 09/14/2024 03:26 PM EST RP Workstation: [id number]    EKG: Sinus tach at 119 bpm with normal axis and nonspecific ST-T wave changes.   Assessment/Plan  This is a 58 y.o. female with a history of anxiety, asthma, COPD, CKD, DM, depression, HTN, HLD, vonWillebrand's now being admitted with:  Acute COPD exacerbation,  -Admit inpatient telemetry - Continue IV Solu-Medrol  - DuoNebs and as needed albuterol  - Check  respiratory panel and sputum culture -Continue Trelegy, cetirizine -Check D-dimer and if positive check CTA given increased work of breathing - Trial CPAP for work of breathing   Chest pain, rule out ACS - Trend troponins, check lipids and TSH. - Continue aspirin , Imdur , propranolol , atorvastatin  - Recent echo done on November 3 revealed LVEF of 55 to 60% with mild LVH and grade 1 diastolic dysfunction  HTN - Continue propranolol , Imdur  with hold parameters   GERD - Stable, continue PPI   Anxiety/depression -Continue buspirone  and sertraline  History of polycythemia vera - Continue hydroxyurea  - Follow up with hematology as outpatient  History of Von Willebrands - SCDs and early ambulation for DVT prophylaxis.    Admission status: IP tele IV Fluids: LR Diet/Nutrition: Heart healthy carb controlled Consults called: None  DVT Px: SCDs and early ambulation. Code Status: Full Code  Disposition Plan: To home in 1-2 days  All the records are reviewed and case discussed with ED provider. Management plans discussed with the patient and/or family who express understanding and agree with plan of care.  Karas Pickerill D.O. on 09/14/2024 at 8:28 PM CC: Primary care physician; Lauren Stefano Iles, MD   09/14/2024, 8:28 PM

## 2024-09-14 NOTE — ED Provider Notes (Signed)
 [facility] Provider Note   Event Date/Time   First MD Initiated Contact with Patient 09/14/24 1629     (approximate) History  Chest Pain  HPI Lauren Escobar is a 58 y.o. female with a past medical history of hypertension, CKD, von Willebrand's disease, type 2 diabetes, continued tobacco abuse, and COPD with emphysema who presents complaining of of sharp central substernal chest pain with associated increased shortness of breath that began this morning.  Patient has been using her at home nebulizer treatments and inhalers on time and as prescribed without any improvement of her symptoms.  Patient in obvious respiratory distress upon this interview.  Patient denies any recent travel or sick contacts ROS: Patient currently denies any vision changes, tinnitus, difficulty speaking, facial droop, sore throat, abdominal pain, nausea/vomiting/diarrhea, dysuria, or weakness/numbness/paresthesias in any extremity   Physical Exam  Triage Vital Signs: ED Triage Vitals  Encounter Vitals Group     BP 09/14/24 1437 106/80     Girls Systolic BP Percentile --      Girls Diastolic BP Percentile --      Boys Systolic BP Percentile --      Boys Diastolic BP Percentile --      Pulse Rate 09/14/24 1437 (!) 114     Resp 09/14/24 1437 17     Temp 09/14/24 1437 97.7 F (36.5 C)     Temp Source 09/14/24 1437 Oral     SpO2 09/14/24 1437 100 %     Weight 09/14/24 1442 160 lb (72.6 kg)     Height 09/14/24 1442 4' 11 (1.499 m)     Head Circumference --      Peak Flow --      Pain Score 09/14/24 1440 8     Pain Loc --      Pain Education --      Exclude from Growth Chart --    Most recent vital signs: Vitals:   09/14/24 1437 09/14/24 1906  BP: 106/80 (!) 96/50  Pulse: (!) 114 (!) 106  Resp: 17 (!) 24  Temp: 97.7 F (36.5 C) 97.8 F (36.6 C)  SpO2: 100% 95%   General: Awake, oriented x4. CV:  Good peripheral perfusion. Resp:  Increased effort.  Tachypneic.  Auto  PEEPing.  Mild end expiratory wheezing Abd:  No distention. Other:  Middle-aged obese Caucasian female resting in stretcher in mild respiratory distress ED Results / Procedures / Treatments  Labs (all labs ordered are listed, but only abnormal results are displayed) Labs Reviewed  BASIC METABOLIC PANEL WITH GFR - Abnormal; Notable for the following components:      Result Value   Glucose, Bld 103 (*)    All other components within normal limits  CBC - Abnormal; Notable for the following components:   RBC 3.69 (*)    MCV 102.7 (*)    MCH 36.6 (*)    All other components within normal limits  RESP PANEL BY RT-PCR (RSV, FLU A&B, COVID)  [id number]  TROPONIN T, HIGH SENSITIVITY  TROPONIN T, HIGH SENSITIVITY   EKG ED ECG REPORT I, Artist MARLA Kerns, the attending physician, personally viewed and interpreted this ECG. Date: 09/14/2024 EKG Time: 1448 Rate: 119 Rhythm: Tachycardic sinus rhythm QRS Axis: normal Intervals: normal ST/T Wave abnormalities: normal Narrative Interpretation: Tachycardic sinus rhythm.  No evidence of acute ischemia RADIOLOGY ED MD interpretation: 2 view chest x-ray interpreted by me shows no evidence of acute abnormalities including no pneumonia, pneumothorax, or widened mediastinum -  All radiology independently interpreted and agree with radiology assessment Official radiology report(s): DG Chest 2 View Result Date: 09/14/2024 EXAM: 2 VIEW(S) XRAY OF THE CHEST 09/14/2024 03:05:15 PM COMPARISON: Comparison 04/12/2024. CLINICAL HISTORY: CP. FINDINGS: LUNGS AND PLEURA: No focal pulmonary opacity. No pleural effusion. No pneumothorax. HEART AND MEDIASTINUM: No acute abnormality of the cardiac and mediastinal silhouettes. BONES AND SOFT TISSUES: No acute osseous abnormality. IMPRESSION: 1. No acute cardiopulmonary process. Electronically signed by: Lynwood Seip MD 09/14/2024 03:26 PM EST RP Workstation: [id number]   PROCEDURES: Critical Care performed: Yes, see critical  care procedure note(s) .1-3 Lead EKG Interpretation  Performed by: Jossie Artist POUR, MD Authorized by: Jossie Artist POUR, MD     Interpretation: abnormal     ECG rate:  111   ECG rate assessment: tachycardic     Rhythm: sinus tachycardia     Ectopy: none     Conduction: normal   CRITICAL CARE Performed by: Elisavet Buehrer K Milik Gilreath  Total critical care time: 33 minutes  Critical care time was exclusive of separately billable procedures and treating other patients.  Critical care was necessary to treat or prevent imminent or life-threatening deterioration.  Critical care was time spent personally by me on the following activities: development of treatment plan with patient and/or surrogate as well as nursing, discussions with consultants, evaluation of patient's response to treatment, examination of patient, obtaining history from patient or surrogate, ordering and performing treatments and interventions, ordering and review of laboratory studies, ordering and review of radiographic studies, pulse oximetry and re-evaluation of patient's condition.  MEDICATIONS ORDERED IN ED: Medications  albuterol  (PROVENTIL ) (2.5 MG/3ML) 0.083% nebulizer solution (has no administration in time range)  ipratropium-albuterol  (DUONEB) 0.5-2.5 (3) MG/3ML nebulizer solution 6 mL (6 mLs Nebulization Given 09/14/24 1822)  methylPREDNISolone  sodium succinate (SOLU-MEDROL ) 125 mg/2 mL injection 125 mg (125 mg Intravenous Given 09/14/24 1822)  magnesium  sulfate IVPB 2 g 50 mL (0 g Intravenous Stopped 09/14/24 1933)  ketorolac  (TORADOL ) 30 MG/ML injection 15 mg (15 mg Intravenous Given 09/14/24 2004)   IMPRESSION / MDM / ASSESSMENT AND PLAN / ED COURSE  I reviewed the triage vital signs and the nursing notes.                             The patient is on the cardiac monitor to evaluate for evidence of arrhythmia and/or significant heart rate changes. Patient's presentation is most consistent with acute presentation with  potential threat to life or bodily function. Patient is a 58 year old female with the above-stated past medical history that presents complaining of chest pain and worsening shortness of breath that began this morning despite taking her home doses of nebulizer treatments and inhalers for her COPD DDx: COPD exacerbation, ACS, pneumonia, pneumothorax Plan: CBC, BMP, troponin, chest x-ray, EKG  Despite multiple breathing treatments, steroids, IV magnesium , and supplemental oxygen, patient has continued to be in mild respiratory distress including tachypnea.  Patient is been placed on 2 L nasal cannula with mild improvement of her respiratory status.  Given the need for supplemental oxygenation at this time, patient will require admission to the internal medicine service for further evaluation and management  Dispo: Admit to medicine   FINAL CLINICAL IMPRESSION(S) / ED DIAGNOSES   Final diagnoses:  Chest pain, unspecified type  Shortness of breath  COPD with acute exacerbation (HCC)   Rx / DC Orders   ED Discharge Orders     None  Note:  This document was prepared using Dragon voice recognition software and may include unintentional dictation errors.   Rasheida Broden K, MD 09/14/24 2030

## 2024-09-15 ENCOUNTER — Other Ambulatory Visit (HOSPITAL_COMMUNITY): Payer: Self-pay

## 2024-09-15 ENCOUNTER — Telehealth (HOSPITAL_COMMUNITY): Payer: Self-pay | Admitting: Pharmacy Technician

## 2024-09-15 DIAGNOSIS — J432 Centrilobular emphysema: Secondary | ICD-10-CM

## 2024-09-15 DIAGNOSIS — J441 Chronic obstructive pulmonary disease with (acute) exacerbation: Secondary | ICD-10-CM | POA: Diagnosis not present

## 2024-09-15 LAB — BASIC METABOLIC PANEL WITH GFR
Anion gap: 12 (ref 5–15)
BUN: 19 mg/dL (ref 6–20)
CO2: 21 mmol/L — ABNORMAL LOW (ref 22–32)
Calcium: 9.2 mg/dL (ref 8.9–10.3)
Chloride: 104 mmol/L (ref 98–111)
Creatinine, Ser: 0.72 mg/dL (ref 0.44–1.00)
GFR, Estimated: >60 mL/min (ref >60)
Glucose, Bld: 162 mg/dL — ABNORMAL HIGH (ref 70–99)
Potassium: 4.3 mmol/L (ref 3.5–5.1)
Sodium: 137 mmol/L (ref 135–145)

## 2024-09-15 LAB — CBC
HCT: 35.5 % — ABNORMAL LOW (ref 36.0–46.0)
Hemoglobin: 12.5 g/dL (ref 12.0–15.0)
MCH: 36.7 pg — ABNORMAL HIGH (ref 26.0–34.0)
MCHC: 35.2 g/dL (ref 30.0–36.0)
MCV: 104.1 fL — ABNORMAL HIGH (ref 80.0–100.0)
Platelets: 267 K/uL (ref 150–400)
RBC: 3.41 MIL/uL — ABNORMAL LOW (ref 3.87–5.11)
RDW: 13.2 % (ref 11.5–15.5)
WBC: 7.6 K/uL (ref 4.0–10.5)
nRBC: 0 % (ref 0.0–0.2)

## 2024-09-15 LAB — GLUCOSE, CAPILLARY
Glucose-Capillary: 156 mg/dL — ABNORMAL HIGH (ref 70–99)
Glucose-Capillary: 136 mg/dL — ABNORMAL HIGH (ref 70–99)
Glucose-Capillary: 168 mg/dL — ABNORMAL HIGH (ref 70–99)
Glucose-Capillary: 112 mg/dL — ABNORMAL HIGH (ref 70–99)

## 2024-09-15 LAB — HEMOGLOBIN A1C
Hgb A1c MFr Bld: 5.1 % (ref 4.8–5.6)
Mean Plasma Glucose: 99.67 mg/dL

## 2024-09-15 LAB — PHOSPHORUS: Phosphorus: 3.5 mg/dL (ref 2.5–4.6)

## 2024-09-15 LAB — MAGNESIUM: Magnesium: 2.7 mg/dL — ABNORMAL HIGH (ref 1.7–2.4)

## 2024-09-15 LAB — FOLATE: Folate: >20 ng/mL (ref >5.9)

## 2024-09-15 LAB — VITAMIN B12: Vitamin B-12: 367 pg/mL (ref 180–914)

## 2024-09-15 LAB — TROPONIN T, HIGH SENSITIVITY: Troponin T High Sensitivity: <15 ng/L (ref 0–19)

## 2024-09-15 MED ORDER — ZOLPIDEM TARTRATE 5 MG PO TABS
5.0000 mg | ORAL_TABLET | Freq: Every day | ORAL | Status: AC
  Administered 2024-09-15: 5 mg via ORAL
  Filled 2024-09-15: qty 1

## 2024-09-15 MED ORDER — VITAMIN B-12 1000 MCG PO TABS
500.0000 ug | ORAL_TABLET | Freq: Every day | ORAL | Status: DC
  Administered 2024-09-15 – 2024-09-17 (×3): 500 ug via ORAL
  Filled 2024-09-15 (×3): qty 1

## 2024-09-15 MED ORDER — IPRATROPIUM-ALBUTEROL 0.5-2.5 (3) MG/3ML IN SOLN
3.0000 mL | Freq: Two times a day (BID) | RESPIRATORY_TRACT | Status: DC
  Administered 2024-09-15 – 2024-09-16 (×2): 3 mL via RESPIRATORY_TRACT
  Filled 2024-09-15 (×2): qty 3

## 2024-09-15 MED ORDER — ENOXAPARIN SODIUM 40 MG/0.4ML IJ SOSY
40.0000 mg | PREFILLED_SYRINGE | Freq: Every evening | INTRAMUSCULAR | Status: DC
  Administered 2024-09-15 – 2024-09-16 (×2): 40 mg via SUBCUTANEOUS
  Filled 2024-09-15 (×2): qty 0.4

## 2024-09-15 MED ORDER — METHYLPREDNISOLONE SODIUM SUCC 40 MG IJ SOLR
40.0000 mg | INTRAMUSCULAR | Status: DC
  Administered 2024-09-16 – 2024-09-17 (×2): 40 mg via INTRAVENOUS
  Filled 2024-09-15 (×2): qty 1

## 2024-09-15 NOTE — Progress Notes (Signed)
 Pulmonary Individual Treatment Plan  Patient Details  Name: Lauren Escobar MRN: [id number] Date of Birth: 1966/10/01 Referring Provider:   Flowsheet Row Pulmonary Rehab from 04/28/2024 in [facility] Cardiac and Pulmonary Rehab  Referring Provider [name] MD    Initial Encounter Date:  Flowsheet Row Pulmonary Rehab from 04/28/2024 in [facility] Cardiac and Pulmonary Rehab  Date 04/28/24    Visit Diagnosis: Centrilobular emphysema (HCC)  Patient's Home Medications on Admission: No current facility-administered medications for this visit. No current outpatient medications on file.  Facility-Administered Medications Ordered in Other Visits:    0.9 %  sodium chloride  infusion, , Intravenous, Continuous, Hugelmeyer, Alexis, DO, Last Rate: 75 mL/hr at 09/14/24 2200, New Bag at 09/14/24 2200   acetaminophen  (TYLENOL ) tablet 650 mg, 650 mg, Oral, Q6H PRN **OR** acetaminophen  (TYLENOL ) suppository 650 mg, 650 mg, Rectal, Q6H PRN, Hugelmeyer, Alexis, DO   aspirin  EC tablet 81 mg, 81 mg, Oral, Daily, Hugelmeyer, Alexis, DO, 81 mg at 09/15/24 9178   atorvastatin  (LIPITOR) tablet 20 mg, 20 mg, Oral, QHS, Hugelmeyer, Alexis, DO   benzonatate  (TESSALON ) capsule 100 mg, 100 mg, Oral, TID PRN, Hugelmeyer, Alexis, DO   bisacodyl (DULCOLAX) EC tablet 5 mg, 5 mg, Oral, Daily PRN, Hugelmeyer, Alexis, DO   budesonide -glycopyrrolate -formoterol  (BREZTRI ) 160-9-4.8 MCG/ACT inhaler 2 puff, 2 puff, Inhalation, BID, Hugelmeyer, Alexis, DO, 2 puff at 09/15/24 0857   busPIRone  (BUSPAR ) tablet 7.5 mg, 7.5 mg, Oral, TID, Hugelmeyer, Alexis, DO, 7.5 mg at 09/15/24 9178   cyclobenzaprine  (FLEXERIL ) tablet 10 mg, 10 mg, Oral, QHS, Hugelmeyer, Alexis, DO   folic acid  (FOLVITE ) tablet 1 mg, 1 mg, Oral, Daily, Hugelmeyer, Alexis, DO, 1 mg at 09/15/24 0820   hydrALAZINE  (APRESOLINE ) injection 5 mg, 5 mg, Intravenous, Q6H PRN, Hugelmeyer, Alexis, DO   HYDROcodone -acetaminophen  (NORCO/VICODIN) 5-325 MG per tablet 1-2 tablet, 1-2  tablet, Oral, Q4H PRN, Hugelmeyer, Alexis, DO, 2 tablet at 09/15/24 0518   hydroxyurea  (HYDREA ) capsule 500 mg, 500 mg, Oral, BID, Hugelmeyer, Alexis, DO, 500 mg at 09/15/24 9178   insulin  aspart (novoLOG ) injection 0-20 Units, 0-20 Units, Subcutaneous, TID WC, Hugelmeyer, Alexis, DO, 4 Units at 09/15/24 0831   insulin  aspart (novoLOG ) injection 0-5 Units, 0-5 Units, Subcutaneous, QHS, Hugelmeyer, Alexis, DO   ipratropium-albuterol  (DUONEB) 0.5-2.5 (3) MG/3ML nebulizer solution 3 mL, 3 mL, Nebulization, BID, Von Bellis, MD   isosorbide  mononitrate (IMDUR ) 24 hr tablet 30 mg, 30 mg, Oral, Daily, Hugelmeyer, Alexis, DO, 30 mg at 09/15/24 9178   loratadine  (CLARITIN ) tablet 10 mg, 10 mg, Oral, Daily, Hugelmeyer, Alexis, DO, 10 mg at 09/15/24 9178   methylPREDNISolone  sodium succinate (SOLU-MEDROL ) 125 mg/2 mL injection 80 mg, 80 mg, Intravenous, Q24H, Hugelmeyer, Alexis, DO, 80 mg at 09/15/24 0518   morphine  (PF) 2 MG/ML injection 1 mg, 1 mg, Intravenous, Q6H PRN, Hugelmeyer, Alexis, DO, 1 mg at 09/15/24 9185   multivitamin liquid, , Oral, Daily, Hugelmeyer, Alexis, DO, 15 mL at 09/15/24 0827   ondansetron  (ZOFRAN ) tablet 4 mg, 4 mg, Oral, Q6H PRN **OR** ondansetron  (ZOFRAN ) injection 4 mg, 4 mg, Intravenous, Q6H PRN, Hugelmeyer, Alexis, DO   pantoprazole  (PROTONIX ) EC tablet 40 mg, 40 mg, Oral, Daily, Hugelmeyer, Alexis, DO, 40 mg at 09/15/24 9178   propranolol  (INDERAL ) tablet 40 mg, 40 mg, Oral, BID, Hugelmeyer, Alexis, DO, 40 mg at 09/15/24 0820   senna-docusate (Senokot-S) tablet 1 tablet, 1 tablet, Oral, QHS PRN, Hugelmeyer, Alexis, DO   sertraline (ZOLOFT) tablet 100 mg, 100 mg, Oral, Daily, Hugelmeyer, Alexis, DO, 100 mg at 09/15/24 [phone]  sodium chloride  flush (NS) 0.9 % injection 3 mL, 3 mL, Intravenous, Q12H, Hugelmeyer, Alexis, DO, 3 mL at 09/15/24 0827  Past Medical History: Past Medical History:  Diagnosis Date   Abdominal pain    Abnormal mammogram    Anxiety    Asthma     Bleeding disorder    Blood dyscrasia    polycythemia vera   Chronic kidney disease    COPD (chronic obstructive pulmonary disease) (HCC)    Cough    Depression    Diabetes mellitus without complication (HCC)    Fatigue    Headache 2000   Horizontal nystagmus    age 89   Hyperglycemia    Hypertension    Low back pain    Major depressive disorder    Nasal lesion    Neck pain    Snoring    SOB (shortness of breath)    Tobacco use    UTI (urinary tract infection)    Von Willebrand's disease (HCC)     Tobacco Use: Social History   Tobacco Use  Smoking Status Former   Current packs/day: 0.00   Average packs/day: 2.8 packs/day for 29.0 years (79.8 ttl pk-yrs)   Types: Cigarettes   Start date: 10/1991   Quit date: 10/2020   Years since quitting: 3.8  Smokeless Tobacco Never    Labs: Review Flowsheet  More data exists      Latest Ref Rng & Units 06/16/2017 10/15/2022 01/07/2024 04/12/2024 09/14/2024  Labs for ITP Cardiac and Pulmonary Rehab  Cholestrol 0 - 200 mg/dL - 824  - - 816   LDL (calc) 0 - 99 mg/dL - 92  - - 93   HDL-C >59 mg/dL - 49  - - 46   Trlycerides <150 mg/dL - 827  - - 781   Hemoglobin A1c 4.8 - 5.6 % 5.5  6.1  - - 5.1   Bicarbonate 20.0 - 28.0 mmol/L - 23.4  26.5  27.3  -  Acid-base deficit 0.0 - 2.0 mmol/L - 0.7  - - -  O2 Saturation % - 84.1  58.3  38  -     Pulmonary Assessment Scores:  Pulmonary Assessment Scores     Row Name 04/28/24 1546 09/02/24 1125 09/07/24 1110     ADL UCSD   ADL Phase Entry -- Exit   SOB Score total 78 -- 65   Rest 3 -- 1   Walk 4 -- 5   Stairs 5 -- 5   Bath 3 -- 4   Dress 3 -- 2   Shop 4 -- 2     CAT Score   CAT Score 29 -- 23     mMRC Score   mMRC Score 4 2 --      UCSD: Self-administered rating of dyspnea associated with activities of daily living (ADLs) 6-point scale (0 = not at all to 5 = maximal or unable to do because of breathlessness)  Scoring Scores range from 0 to 120.  Minimally important  difference is 5 units  CAT: CAT can identify the health impairment of COPD patients and is better correlated with disease progression.  CAT has a scoring range of zero to 40. The CAT score is classified into four groups of low (less than 10), medium (10 - 20), high (21-30) and very high (31-40) based on the impact level of disease on health status. A CAT score over 10 suggests significant symptoms.  A worsening CAT score could be explained by an  exacerbation, poor medication adherence, poor inhaler technique, or progression of COPD or comorbid conditions.  CAT MCID is 2 points  mMRC: mMRC (Modified Medical Research Council) Dyspnea Scale is used to assess the degree of baseline functional disability in patients of respiratory disease due to dyspnea. No minimal important difference is established. A decrease in score of 1 point or greater is considered a positive change.   Pulmonary Function Assessment:   Exercise Target Goals: Exercise Program Goal: Individual exercise prescription set using results from initial 6 min walk test and THRR while considering  patient's activity barriers and safety.   Exercise Prescription Goal: Initial exercise prescription builds to 30-45 minutes a day of aerobic activity, 2-3 days per week.  Home exercise guidelines will be given to patient during program as part of exercise prescription that the participant will acknowledge.  Education: Aerobic Exercise: - Group verbal and visual presentation on the components of exercise prescription. Introduces F.I.T.T principle from ACSM for exercise prescriptions.  Reviews F.I.T.T. principles of aerobic exercise including progression. Written material provided at class time.   Education: Resistance Exercise: - Group verbal and visual presentation on the components of exercise prescription. Introduces F.I.T.T principle from ACSM for exercise prescriptions  Reviews F.I.T.T. principles of resistance exercise including  progression. Written material provided at class time. Flowsheet Row Pulmonary Rehab from 12/25/2022 in [facility] Cardiac and Pulmonary Rehab  Date 11/20/22  Educator [facility]  Instruction Review Code 1- Verbalizes Understanding     Education: Exercise & Equipment Safety: - Individual verbal instruction and demonstration of equipment use and safety with use of the equipment. Flowsheet Row Pulmonary Rehab from 04/28/2024 in [facility] Cardiac and Pulmonary Rehab  Date 04/28/24  Educator [facility]  Instruction Review Code 1- Verbalizes Understanding    Education: Exercise Physiology & General Exercise Guidelines: - Group verbal and written instruction with models to review the exercise physiology of the cardiovascular system and associated critical values. Provides general exercise guidelines with specific guidelines to those with heart or lung disease.    Education: Flexibility, Balance, Mind/Body Relaxation: - Group verbal and visual presentation with interactive activity on the components of exercise prescription. Introduces F.I.T.T principle from ACSM for exercise prescriptions. Reviews F.I.T.T. principles of flexibility and balance exercise training including progression. Also discusses the mind body connection.  Reviews various relaxation techniques to help reduce and manage stress (i.e. Deep breathing, progressive muscle relaxation, and visualization). Balance handout provided to take home. Written material provided at class time. Flowsheet Row Pulmonary Rehab from 12/25/2022 in [facility] Cardiac and Pulmonary Rehab  Date 11/20/22  Educator [facility]  Instruction Review Code 1- Verbalizes Understanding    Activity Barriers & Risk Stratification:  Activity Barriers & Cardiac Risk Stratification - 04/28/24 1542       Activity Barriers & Cardiac Risk Stratification   Activity Barriers Left Knee Replacement;Back Problems;Shortness of Breath;Balance Concerns          6 Minute Walk:  6 Minute Walk     Row Name  04/28/24 1538 09/02/24 1118       6 Minute Walk   Phase Initial Discharge    Distance 1170 feet 1300 feet    Distance % Change -- 11 %    Distance Feet Change -- 130 ft    Walk Time 6 minutes 6 minutes    # of Rest Breaks 0 0    MPH 2.2 2.46    METS 3.13 3.39    RPE 19 13    Perceived Dyspnea  2 1  VO2 Peak -- 11.88    Symptoms Yes (comment) No    Comments Chest pain 9/10 and resolved to a 7/10 at rest --    Resting HR 93 bpm 97 bpm    Resting BP 100/70 104/60    Resting Oxygen Saturation  96 % 96 %    Exercise Oxygen Saturation  during 6 min walk 91 % 96 %    Max Ex. HR 117 bpm 114 bpm    Max Ex. BP 120/78 128/60    2 Minute Post BP 92/70 104/62      Interval HR   1 Minute HR 100 108    2 Minute HR 117 114    3 Minute HR 112 107    4 Minute HR 110 109    5 Minute HR 87 111    6 Minute HR 95 114    2 Minute Post HR 91 95    Interval Heart Rate? Yes Yes      Interval Oxygen   Interval Oxygen? Yes Yes    Baseline Oxygen Saturation % 96 % 96 %    1 Minute Oxygen Saturation % 91 % 96 %    1 Minute Liters of Oxygen 0 L 0 L    2 Minute Oxygen Saturation % 91 % 96 %    2 Minute Liters of Oxygen 0 L 0 L    3 Minute Oxygen Saturation % 94 % 96 %    3 Minute Liters of Oxygen 0 L 0 L    4 Minute Oxygen Saturation % 94 % 97 %    4 Minute Liters of Oxygen 0 L 0 L    5 Minute Oxygen Saturation % 94 % 97 %    5 Minute Liters of Oxygen 0 L 0 L    6 Minute Oxygen Saturation % 93 % 98 %    6 Minute Liters of Oxygen 0 L 0 L    2 Minute Post Oxygen Saturation % 98 % 98 %    2 Minute Post Liters of Oxygen 0 L 0 L      Oxygen Initial Assessment:  Oxygen Initial Assessment - 04/27/24 1304       Home Oxygen   Home Oxygen Device None    Sleep Oxygen Prescription None    Home Exercise Oxygen Prescription None    Home Resting Oxygen Prescription None    Compliance with Home Oxygen Use Yes      Intervention   Short Term Goals To learn and understand importance of maintaining  oxygen saturations>88%;To learn and understand importance of monitoring SPO2 with pulse oximeter and demonstrate accurate use of the pulse oximeter.;To learn and demonstrate proper pursed lip breathing techniques or other breathing techniques. ;To learn and demonstrate proper use of respiratory medications    Long  Term Goals Verbalizes importance of monitoring SPO2 with pulse oximeter and return demonstration;Maintenance of O2 saturations>88%;Exhibits proper breathing techniques, such as pursed lip breathing or other method taught during program session;Compliance with respiratory medication;Demonstrates proper use of MDI's          Oxygen Re-Evaluation:  Oxygen Re-Evaluation     Row Name 05/04/24 1056 07/08/24 1129 08/31/24 1110         Program Oxygen Prescription   Program Oxygen Prescription None None None       Home Oxygen   Home Oxygen Device None None None     Sleep Oxygen Prescription None None None     Home  Exercise Oxygen Prescription None None None     Home Resting Oxygen Prescription None None None     Compliance with Home Oxygen Use Yes Yes Yes       Goals/Expected Outcomes   Short Term Goals To learn and understand importance of maintaining oxygen saturations>88%;To learn and understand importance of monitoring SPO2 with pulse oximeter and demonstrate accurate use of the pulse oximeter.;To learn and demonstrate proper pursed lip breathing techniques or other breathing techniques. ;To learn and demonstrate proper use of respiratory medications To learn and understand importance of maintaining oxygen saturations>88%;To learn and understand importance of monitoring SPO2 with pulse oximeter and demonstrate accurate use of the pulse oximeter.;To learn and demonstrate proper pursed lip breathing techniques or other breathing techniques. ;To learn and demonstrate proper use of respiratory medications To learn and understand importance of maintaining oxygen saturations>88%     Long   Term Goals Verbalizes importance of monitoring SPO2 with pulse oximeter and return demonstration;Maintenance of O2 saturations>88%;Exhibits proper breathing techniques, such as pursed lip breathing or other method taught during program session;Compliance with respiratory medication;Demonstrates proper use of MDI's Verbalizes importance of monitoring SPO2 with pulse oximeter and return demonstration;Maintenance of O2 saturations>88%;Exhibits proper breathing techniques, such as pursed lip breathing or other method taught during program session;Compliance with respiratory medication;Demonstrates proper use of MDI's Exhibits proper breathing techniques, such as pursed lip breathing or other method taught during program session     Comments Reviewed PLB technique with pt.  Talked about how it works and it's importance in maintaining their exercise saturations. We reviewed PLB technique with pt. She has been practicing her PLB technique while exercising. She states that it is hardest for her to breath when she begins to walk faster. We talked about how PLB works and it's importance in maintaining her exercise saturations. Reviewed PLB technique, she verbalized understanding.     Goals/Expected Outcomes Short: Become more profiecient at using PLB. Long: Become independent at using PLB. Short: Continue to practice PLB with exercise. Long: Become independent at using PLB. STG: Continue to practice PLB with exercise. LTG: Become independent at using PLB.        Oxygen Discharge (Final Oxygen Re-Evaluation):  Oxygen Re-Evaluation - 08/31/24 1110       Program Oxygen Prescription   Program Oxygen Prescription None      Home Oxygen   Home Oxygen Device None    Sleep Oxygen Prescription None    Home Exercise Oxygen Prescription None    Home Resting Oxygen Prescription None    Compliance with Home Oxygen Use Yes      Goals/Expected Outcomes   Short Term Goals To learn and understand importance of maintaining  oxygen saturations>88%    Long  Term Goals Exhibits proper breathing techniques, such as pursed lip breathing or other method taught during program session    Comments Reviewed PLB technique, she verbalized understanding.    Goals/Expected Outcomes STG: Continue to practice PLB with exercise. LTG: Become independent at using PLB.          Initial Exercise Prescription:  Initial Exercise Prescription - 04/28/24 1500       Date of Initial Exercise RX and Referring Provider   Date 04/28/24    Referring Provider [name] MD      Oxygen   Maintain Oxygen Saturation 88% or higher      Recumbant Bike   Level 2    RPM 50    Watts 25    Minutes 15  METs 3.13      NuStep   Level 2    SPM 80    Minutes 15    METs 3.13      REL-XR   Level 1    Speed 50    Minutes 15    METs 3.13      T5 Nustep   Level 2    SPM 80    Minutes 15    METs 3.13      Track   Laps 26    Minutes 15    METs 2.41      Prescription Details   Frequency (times per week) 2    Duration Progress to 30 minutes of continuous aerobic without signs/symptoms of physical distress      Intensity   THRR 40-80% of Max Heartrate 117-147    Ratings of Perceived Exertion 11-13    Perceived Dyspnea 0-4      Progression   Progression Continue to progress workloads to maintain intensity without signs/symptoms of physical distress.      Resistance Training   Training Prescription Yes    Weight 4 lb    Reps 10-15          Perform Capillary Blood Glucose checks as needed.  Exercise Prescription Changes:   Exercise Prescription Changes     Row Name 04/28/24 1500 05/18/24 1400 05/31/24 1600 06/17/24 0900 06/29/24 1000     Response to Exercise   Blood Pressure (Admit) 100/70 124/68 132/68 96/52 96/60    Blood Pressure (Exercise) 120/78 120/60 126/68 130/70 --   Blood Pressure (Exit) 92/70 118/62 102/60 118/60 104/54   Heart Rate (Admit) 93 bpm 90 bpm 107 bpm 103 bpm 85 bpm   Heart Rate  (Exercise) 117 bpm 98 bpm 106 bpm 108 bpm 97 bpm   Heart Rate (Exit) 88 bpm 89 bpm 89 bpm 92 bpm 82 bpm   Oxygen Saturation (Admit) 96 % 95 % 97 % 95 % 96 %   Oxygen Saturation (Exercise) 91 % 92 % 96 % 91 % 95 %   Oxygen Saturation (Exit) 98 % 93 % 98 % 97 % 96 %   Rating of Perceived Exertion (Exercise) 19 14 15 14 15    Perceived Dyspnea (Exercise) 2 1 2 2 2    Symptoms chest pain 9/10, resolved to 7/10 with rest none none none none   Comments results 1st 2 weeks of exercise sessions -- -- --   Duration -- Progress to 30 minutes of  aerobic without signs/symptoms of physical distress Continue with 30 min of aerobic exercise without signs/symptoms of physical distress. Continue with 30 min of aerobic exercise without signs/symptoms of physical distress. Continue with 30 min of aerobic exercise without signs/symptoms of physical distress.   Intensity THRR New THRR unchanged THRR unchanged THRR unchanged THRR unchanged     Progression   Progression -- Continue to progress workloads to maintain intensity without signs/symptoms of physical distress. Continue to progress workloads to maintain intensity without signs/symptoms of physical distress. Continue to progress workloads to maintain intensity without signs/symptoms of physical distress. Continue to progress workloads to maintain intensity without signs/symptoms of physical distress.   Average METs 3.13 2.37 2.78 2.52 2.55     Resistance Training   Training Prescription -- Yes Yes Yes Yes   Weight -- 4 lb 4 lb 4 lb 4 lb   Reps -- 10-15 10-15 10-15 10-15     Interval Training   Interval Training -- No No No  No     Treadmill   MPH -- -- -- 1.5 1.6   Grade -- -- -- -- 0   Minutes -- -- -- 15 15   METs -- -- -- 2.15 2.23     NuStep   Level -- 2 3 2  --   Minutes -- 15 15 15  --   METs -- 2.2 2.4 2 --     REL-XR   Level -- 2 2 4 5    Minutes -- 15 15 15 15    METs -- 2.7 3.7 -- 3.5     Track   Laps -- 30 30 17 17    Minutes -- 15  15 15 15    METs -- 2.63 2.63 1.92 1.92     Oxygen   Maintain Oxygen Saturation -- 88% or higher 88% or higher 88% or higher 88% or higher    Row Name 07/13/24 1100 07/15/24 0800 07/26/24 1000 08/11/24 1100 08/25/24 0700     Response to Exercise   Blood Pressure (Admit) -- 94/56 104/62 104/60 106/60   Blood Pressure (Exit) -- 112/60 104/58 96/56 114/62   Heart Rate (Admit) -- 80 bpm 95 bpm 85 bpm 115 bpm   Heart Rate (Exercise) -- 93 bpm 112 bpm 109 bpm 139 bpm   Heart Rate (Exit) -- 85 bpm 83 bpm 89 bpm 130 bpm   Oxygen Saturation (Admit) -- 97 % 99 % 96 % 98 %   Oxygen Saturation (Exercise) -- 96 % 90 % 91 % 96 %   Oxygen Saturation (Exit) -- 97 % 97 % 95 % 97 %   Rating of Perceived Exertion (Exercise) -- 14 15 13 15    Perceived Dyspnea (Exercise) -- 2 1 3 2    Symptoms -- none none none none   Duration -- Continue with 30 min of aerobic exercise without signs/symptoms of physical distress. Continue with 30 min of aerobic exercise without signs/symptoms of physical distress. Continue with 30 min of aerobic exercise without signs/symptoms of physical distress. Continue with 30 min of aerobic exercise without signs/symptoms of physical distress.   Intensity -- THRR unchanged THRR unchanged THRR unchanged THRR unchanged     Progression   Progression -- Continue to progress workloads to maintain intensity without signs/symptoms of physical distress. Continue to progress workloads to maintain intensity without signs/symptoms of physical distress. Continue to progress workloads to maintain intensity without signs/symptoms of physical distress. Continue to progress workloads to maintain intensity without signs/symptoms of physical distress.   Average METs -- 2.28 2.73 3.02 2.9     Resistance Training   Training Prescription -- Yes Yes Yes Yes   Weight -- 4 lb 4 lb 4 lb 4 lb   Reps -- 10-15 10-15 10-15 10-15     Interval Training   Interval Training -- No No No No     Treadmill   MPH --  2.1 2.1 2.2 2.2   Grade -- 0 0 0 0   Minutes -- 15 15 15 15    METs -- 2.61 2.61 2.69 2.69     REL-XR   Level -- 4 6 6 8    Minutes -- 15 15 15 15    METs -- -- 2.6 3.7 3.2     Home Exercise Plan   Plans to continue exercise at Home (comment)  Walking at home on days away from rehab Home (comment)  Walking at home on days away from rehab Home (comment)  Walking at home on days away from rehab Home (  comment)  Walking at home on days away from rehab Home (comment)  Walking at home on days away from rehab   Frequency Add 1 additional day to program exercise sessions. Add 1 additional day to program exercise sessions. Add 1 additional day to program exercise sessions. Add 1 additional day to program exercise sessions. Add 1 additional day to program exercise sessions.   Initial Home Exercises Provided 07/13/24 07/13/24 07/13/24 07/13/24 07/13/24     Oxygen   Maintain Oxygen Saturation 88% or higher 88% or higher 88% or higher 88% or higher 88% or higher    Row Name 09/09/24 1100             Response to Exercise   Blood Pressure (Admit) 104/60       Blood Pressure (Exit) 108/68       Heart Rate (Admit) 97 bpm       Heart Rate (Exercise) 126 bpm       Heart Rate (Exit) 105 bpm       Oxygen Saturation (Admit) 98 %       Oxygen Saturation (Exercise) 93 %       Oxygen Saturation (Exit) 98 %       Rating of Perceived Exertion (Exercise) 15       Perceived Dyspnea (Exercise) 3       Symptoms none       Duration Continue with 30 min of aerobic exercise without signs/symptoms of physical distress.       Intensity THRR unchanged         Progression   Progression Continue to progress workloads to maintain intensity without signs/symptoms of physical distress.       Average METs 3.1         Resistance Training   Training Prescription Yes       Weight 4 lb       Reps 10-15         Interval Training   Interval Training No         Treadmill   MPH 2.4       Grade 0       Minutes 15        METs 2.84         REL-XR   Level 6       Minutes 15       METs 4.3         Home Exercise Plan   Plans to continue exercise at Home (comment)  Walking at home on days away from rehab       Frequency Add 1 additional day to program exercise sessions.       Initial Home Exercises Provided 07/13/24         Oxygen   Maintain Oxygen Saturation 88% or higher          Exercise Comments:   Exercise Comments     Row Name 05/04/24 1053           Exercise Comments First full day of exercise!  Patient was oriented to gym and equipment including functions, settings, policies, and procedures.  Patient's individual exercise prescription and treatment plan were reviewed.  All starting workloads were established based on the results of the 6 minute walk test done at initial orientation visit.  The plan for exercise progression was also introduced and progression will be customized based on patient's performance and goals.          Exercise Goals and Review:  Exercise Goals     Row Name 04/28/24 1545             Exercise Goals   Increase Physical Activity Yes       Intervention Provide advice, education, support and counseling about physical activity/exercise needs.;Develop an individualized exercise prescription for aerobic and resistive training based on initial evaluation findings, risk stratification, comorbidities and participant's personal goals.       Expected Outcomes Long Term: Exercising regularly at least 3-5 days a week.;Long Term: Add in home exercise to make exercise part of routine and to increase amount of physical activity.;Short Term: Attend rehab on a regular basis to increase amount of physical activity.       Increase Strength and Stamina Yes       Intervention Provide advice, education, support and counseling about physical activity/exercise needs.;Develop an individualized exercise prescription for aerobic and resistive training based on initial evaluation  findings, risk stratification, comorbidities and participant's personal goals.       Expected Outcomes Short Term: Increase workloads from initial exercise prescription for resistance, speed, and METs.;Short Term: Perform resistance training exercises routinely during rehab and add in resistance training at home;Long Term: Improve cardiorespiratory fitness, muscular endurance and strength as measured by increased METs and functional capacity ( )       Able to understand and use rate of perceived exertion (RPE) scale Yes       Intervention Provide education and explanation on how to use RPE scale       Expected Outcomes Short Term: Able to use RPE daily in rehab to express subjective intensity level;Long Term:  Able to use RPE to guide intensity level when exercising independently       Able to understand and use Dyspnea scale Yes       Intervention Provide education and explanation on how to use Dyspnea scale       Expected Outcomes Short Term: Able to use Dyspnea scale daily in rehab to express subjective sense of shortness of breath during exertion;Long Term: Able to use Dyspnea scale to guide intensity level when exercising independently       Knowledge and understanding of Target Heart Rate Range (THRR) Yes       Intervention Provide education and explanation of THRR including how the numbers were predicted and where they are located for reference       Expected Outcomes Short Term: Able to state/look up THRR;Short Term: Able to use daily as guideline for intensity in rehab;Long Term: Able to use THRR to govern intensity when exercising independently       Able to check pulse independently Yes       Intervention Provide education and demonstration on how to check pulse in carotid and radial arteries.;Review the importance of being able to check your own pulse for safety during independent exercise       Expected Outcomes Short Term: Able to explain why pulse checking is important during  independent exercise;Long Term: Able to check pulse independently and accurately       Understanding of Exercise Prescription Yes       Intervention Provide education, explanation, and written materials on patient's individual exercise prescription       Expected Outcomes Short Term: Able to explain program exercise prescription;Long Term: Able to explain home exercise prescription to exercise independently          Exercise Goals Re-Evaluation :  Exercise Goals Re-Evaluation     Row Name 05/04/24 1053 05/18/24  1444 05/31/24 1618 06/17/24 0923 06/29/24 1017     Exercise Goal Re-Evaluation   Exercise Goals Review Increase Physical Activity;Able to understand and use rate of perceived exertion (RPE) scale;Knowledge and understanding of Target Heart Rate Range (THRR);Understanding of Exercise Prescription;Able to understand and use Dyspnea scale;Able to check pulse independently;Increase Strength and Stamina Increase Physical Activity;Understanding of Exercise Prescription;Increase Strength and Stamina Increase Physical Activity;Understanding of Exercise Prescription;Increase Strength and Stamina Increase Physical Activity;Understanding of Exercise Prescription;Increase Strength and Stamina Increase Physical Activity;Understanding of Exercise Prescription;Increase Strength and Stamina   Comments Reviewed RPE and dyspnea scale, THR and program prescription with pt today.  Pt voiced understanding and was given a copy of goals to take home. Mayumi is off to a good start in the program and completed her first 2 weeks of exercise in this review. She tolerated her exercise prescription well. She worked at level 2 on the T4 nustep and XR. She walked 30 laps on the track. We will continue to monitor her progress in the program. Evalin is doing well in rehab. She continues to walk 30 laps on the track. She also recently improved to level 3 on the T4 nustep. We will continue to monitor her progress in the program.  Logann is doing well in rehab. She was recently able to increase from level 2 to 4 on the XR. She was also able to add the treadmill to her current exercise prescription at a speed of 1.5 and no incline. We will continue to monitor her progress in the program. Naylah is doing well in rehab. She was recently able to increase her speed on the treadmill to 1.6 mph with no incline. She also improved to level 5 on the XR. We will continue to monitor her progress in the program.   Expected Outcomes Short: Use RPE daily to regulate intensity. Long: Follow program prescription in THR. Short: Continue to follow current exercise prescription. Long: Continue exercise to improve strength and stamina. Short: Continue to progressively increase workloads. Long: Continue exercise to improve strength and stamina. Short: Continue to follow exercise prescription, and increase workloads when able. Long: Continue exercise to improve strength and stamina. Short: Continue to progressively increase workloads. Long: Continue exercise to improve strength and stamina.    Row Name 07/13/24 1136 07/15/24 0816 07/26/24 1049 07/27/24 1124 08/11/24 1119     Exercise Goal Re-Evaluation   Exercise Goals Review Increase Physical Activity;Understanding of Exercise Prescription;Increase Strength and Stamina;Able to understand and use Dyspnea scale;Knowledge and understanding of Target Heart Rate Range (THRR);Able to check pulse independently;Able to understand and use rate of perceived exertion (RPE) scale Increase Physical Activity;Understanding of Exercise Prescription;Increase Strength and Stamina Increase Physical Activity;Understanding of Exercise Prescription;Increase Strength and Stamina Increase Physical Activity;Increase Strength and Stamina;Understanding of Exercise Prescription Increase Physical Activity;Increase Strength and Stamina;Understanding of Exercise Prescription   Comments Reviewed home exercise with pt today from 1125 to  1134.  Pt plans to walk at home for exercise on days away from rehab.  Reviewed THR, pulse, RPE, sign and symptoms, pulse oximetery and when to call 911 or MD.  Also discussed weather considerations and indoor options.  Pt voiced understanding. Elmer is doing well in rehab. She increased her workload on the treadmill to a speed of 2.1 mph and no incline. She worked at level 4 on the XR. We will continue to monitor her progress in the program. Paiton continues to do well in rehab. She was recently able to increase from level 4 to 6  on the XR. She was also able to maintain a treadmill workload of 2. and no incline. We will continue to monitor her progress in the program. Kesi is doing well at rehab. She is walking at home, both indoor and outdoors. Encouraged her to continue to exercise at home Shuna continues to do well in rehab. She recently increased her speed on the treadmill to 2.2 mph with no incline. She also continues to work at level 6 on the XR. We will continue to monitor her progress in the program.   Expected Outcomes Short: Begin walking at home on days away from rehab. Long: Continue to exercise independently. Short: Continue to progressively increase treadmill and XR workloads. Long: Continue exercise to improve strength and stamina. Short: Continue to increase XR workload. Long: Continue exercise to improve strength and stamina. Short: Continue to increase XR workload. Long: Continue exercise to improve strength and stamina. Short: Continue to progressively increase treadmill workload. Long: Continue exercise to improve strength and stamina.    Row Name 08/25/24 0725 08/31/24 1109 09/09/24 1106         Exercise Goal Re-Evaluation   Exercise Goals Review Increase Physical Activity;Increase Strength and Stamina;Understanding of Exercise Prescription Increase Physical Activity;Increase Strength and Stamina;Understanding of Exercise Prescription Increase Physical Activity;Increase Strength  and Stamina;Understanding of Exercise Prescription     Comments Christiane is doing well in rehab. She was recently able to increase from level 6 to 8 on the XR. She was also able to maintain her treadmill workload at a speed of 2. and no incline. We will continue to monitor her progress in the program. Ximenna is doing well at rehab, she is walking indoors and outdoors when not at rehab. Encouraged her to continue to exercise outside of rehab. Trana continues to do well in rehab. She recently completed her post-6MWT and was able to improve by 159ft. She was also able to increase her speed on the treadmill from 2.2 to 2.72mph. We will continue to monitor her progress in the program.     Expected Outcomes Short: Continue to increase workloads when able, increase treadmill incline to 1%. Long: Continue exercise to improve strength and stamina. TTG: Increase workload as able. LTG: Continue exercise to improve strength and stamina. Short: Graduate. Long: Continue exercise to improve strength and stamina.        Discharge Exercise Prescription (Final Exercise Prescription Changes):  Exercise Prescription Changes - 09/09/24 1100       Response to Exercise   Blood Pressure (Admit) 104/60    Blood Pressure (Exit) 108/68    Heart Rate (Admit) 97 bpm    Heart Rate (Exercise) 126 bpm    Heart Rate (Exit) 105 bpm    Oxygen Saturation (Admit) 98 %    Oxygen Saturation (Exercise) 93 %    Oxygen Saturation (Exit) 98 %    Rating of Perceived Exertion (Exercise) 15    Perceived Dyspnea (Exercise) 3    Symptoms none    Duration Continue with 30 min of aerobic exercise without signs/symptoms of physical distress.    Intensity THRR unchanged      Progression   Progression Continue to progress workloads to maintain intensity without signs/symptoms of physical distress.    Average METs 3.1      Resistance Training   Training Prescription Yes    Weight 4 lb    Reps 10-15      Interval Training   Interval  Training No      Treadmill  MPH 2.4    Grade 0    Minutes 15    METs 2.84      REL-XR   Level 6    Minutes 15    METs 4.3      Home Exercise Plan   Plans to continue exercise at Home (comment)   Walking at home on days away from rehab   Frequency Add 1 additional day to program exercise sessions.    Initial Home Exercises Provided 07/13/24      Oxygen   Maintain Oxygen Saturation 88% or higher          Nutrition:  Target Goals: Understanding of nutrition guidelines, daily intake of sodium 1500mg , cholesterol 200mg , calories 30% from fat and 7% or less from saturated fats, daily to have 5 or more servings of fruits and vegetables.  Education: Nutrition 1 -Group instruction provided by verbal, written material, interactive activities, discussions, models, and posters to present general guidelines for heart healthy nutrition including macronutrients, label reading, and promoting whole foods over processed counterparts. Education serves as pensions consultant of discussion of heart healthy eating for all. Written material provided at class time.     Education: Nutrition 2 -Group instruction provided by verbal, written material, interactive activities, discussions, models, and posters to present general guidelines for heart healthy nutrition including sodium, cholesterol, and saturated fat. Providing guidance of habit forming to improve blood pressure, cholesterol, and body weight. Written material provided at class time.     Biometrics:  Pre Biometrics - 04/28/24 1546       Pre Biometrics   Height 5' 1.5 (1.562 m)    Weight 165 lb 12.8 oz (75.2 kg)    Waist Circumference 42.5 inches    Hip Circumference 42 inches    Waist to Hip Ratio 1.01 %    BMI (Calculated) 30.82    Single Leg Stand 1 seconds          Post Biometrics - 09/02/24 1123        Post  Biometrics   Height 5' 1.5 (1.562 m)    Weight 159 lb 14.4 oz (72.5 kg)    Waist Circumference 41 inches    Hip  Circumference 40 inches    Waist to Hip Ratio 1.03 %    BMI (Calculated) 29.73    Single Leg Stand 1 seconds          Nutrition Therapy Plan and Nutrition Goals:  Nutrition Therapy & Goals - 04/28/24 1512       Nutrition Therapy   Diet Mediterranean    Protein (specify units) 70-90    Fiber 25 grams    Whole Grain Foods 3 servings    Saturated Fats 15 max. grams    Fruits and Vegetables 5 servings/day    Sodium 2 grams      Personal Nutrition Goals   Nutrition Goal Eat 3 times per day, small frequent meals or nutrient dense snacks (use protein shake if needed)    Personal Goal #2 Eat 15-30gProtein and 30-60gCarbs at each meal.    Personal Goal #3 Drink 16-32oz of water daily    Comments Patient doesn't drink much water, mostly just met dew, about 16oz daily. She doesn't eat breakfast or lunch. Usually just dinner. Spoke with her about the importance of eating more throughout the day. She was not sure she would eat more than she already was. Suggested a protein shake as a good starting point. Breaking it into two 5.5oz halves if needed. Suggested  pairing it with a carb, like fruit or crackers. Provided mediterranean diet handout. Kept goals simple to eat more and drink more water.      Intervention Plan   Intervention Prescribe, educate and counsel regarding individualized specific dietary modifications aiming towards targeted core components such as weight, hypertension, lipid management, diabetes, heart failure and other comorbidities.;Nutrition handout(s) given to patient.    Expected Outcomes Long Term Goal: Adherence to prescribed nutrition plan.;Short Term Goal: A plan has been developed with personal nutrition goals set during dietitian appointment.;Short Term Goal: Understand basic principles of dietary content, such as calories, fat, sodium, cholesterol and nutrients.          Nutrition Assessments:  MEDIFICTS Score Key: >=70 Need to make dietary changes  40-70 Heart  Healthy Diet <= 40 Therapeutic Level Cholesterol Diet  Flowsheet Row Pulmonary Rehab from 09/07/2024 in [facility]  Picture Your Plate Total Score on Admission 52  Picture Your Plate Total Score on Discharge 50   Picture Your Plate Scores: <59 Unhealthy dietary pattern with much room for improvement. 41-50 Dietary pattern unlikely to meet recommendations for good health and room for improvement. 51-60 More healthful dietary pattern, with some room for improvement.  >60 Healthy dietary pattern, although there may be some specific behaviors that could be improved.   Nutrition Goals Re-Evaluation:  Nutrition Goals Re-Evaluation     Row Name 07/08/24 1108 07/27/24 1117 08/31/24 1116         Goals   Comment Chiquetta states that she still does not have much of an appetite and is only eating one meal a day. We encouraged her to try and add at least one more nutrient dense snack or meal so she can get enough nutrients. She states that her one meal a day comes at dinner time and usually is a crockpot dinner that includes meat and one vegetable. Hennie is eating one meal per day. does not have much of a appetite. She knows she needs to do better but doesnt feel like eating. Encouraged small snack and picking nutrient dense foods Kabrina is still eating one meal day. Reviewed importance of eating more meals and snacks throughout the day.     Expected Outcome Short: Try adding one more nutrient dense snack each day. Long: Continue to practice heart healthy eating patterns discussed with RD. SGT: Eat 2 to 3 times per day LTG: Continue to practice heart healthy eating patterns discussed with RD. SGT: Eat 2 to 3 times per day LTG: Continue to practice heart healthy eating patterns discussed with RD.        Nutrition Goals Discharge (Final Nutrition Goals Re-Evaluation):  Nutrition Goals Re-Evaluation - 08/31/24 1116       Goals   Comment Ermel is still eating one meal day.  Reviewed importance of eating more meals and snacks throughout the day.    Expected Outcome SGT: Eat 2 to 3 times per day LTG: Continue to practice heart healthy eating patterns discussed with RD.          Psychosocial: Target Goals: Acknowledge presence or absence of significant depression and/or stress, maximize coping skills, provide positive support system. Participant is able to verbalize types and ability to use techniques and skills needed for reducing stress and depression.   Education: Stress, Anxiety, and Depression - Group verbal and visual presentation to define topics covered.  Reviews how body is impacted by stress, anxiety, and depression.  Also discusses healthy ways to reduce stress and  to treat/manage anxiety and depression.  Written material provided at class time. Flowsheet Row Pulmonary Rehab from 12/25/2022 in [facility]  Date 10/30/22  Educator [facility]  Instruction Review Code 1- [employer] Understanding    Education: Sleep Hygiene -Provides group verbal and written instruction about how sleep can affect your health.  Define sleep hygiene, discuss sleep cycles and impact of sleep habits. Review good sleep hygiene tips.    Initial Review & Psychosocial Screening:  Initial Psych Review & Screening - 04/27/24 1328       Initial Review   Current issues with None Identified      Family Dynamics   Good Support System? Yes      Barriers   Psychosocial barriers to participate in program There are no identifiable barriers or psychosocial needs.      Screening Interventions   Interventions Encouraged to exercise;To provide support and resources with identified psychosocial needs;Provide feedback about the scores to participant    Expected Outcomes Short Term goal: Utilizing psychosocial counselor, staff and physician to assist with identification of specific Stressors or current issues interfering with healing process. Setting desired goal for each  stressor or current issue identified.;Long Term Goal: Stressors or current issues are controlled or eliminated.;Short Term goal: Identification and review with participant of any Quality of Life or Depression concerns found by scoring the questionnaire.;Long Term goal: The participant improves quality of Life and PHQ9 Scores as seen by post scores and/or verbalization of changes          Quality of Life Scores:  Scores of 19 and below usually indicate a poorer quality of life in these areas.  A difference of  2-3 points is a clinically meaningful difference.  A difference of 2-3 points in the total score of the Quality of Life Index has been associated with significant improvement in overall quality of life, self-image, physical symptoms, and general health in studies assessing change in quality of life.  PHQ-9: Review Flowsheet  More data exists      08/31/2024 07/27/2024 07/08/2024 04/28/2024 12/30/2022  Depression screen PHQ 2/9  Decreased Interest 3 3 3 3 3   Down, Depressed, Hopeless 1 1 0 3 3  PHQ - 2 Score 4 4 3 6 6   Altered sleeping 3 3 3 3 3   Tired, decreased energy 3 3 3 3 3   Change in appetite 3 3 3 3 3   Feeling bad or failure about yourself  3 3 3 3 3   Trouble concentrating 3 3 3 3 3   Moving slowly or fidgety/restless 3 3 3 3 3   Suicidal thoughts 0 0 0 0 0  PHQ-9 Score 22  22  21  24  24    Difficult doing work/chores Very difficult Extremely dIfficult Extremely dIfficult Very difficult Very difficult    Details       Data saved with a previous flowsheet row definition        Interpretation of Total Score  Total Score Depression Severity:  1-4 = Minimal depression, 5-9 = Mild depression, 10-14 = Moderate depression, 15-19 = Moderately severe depression, 20-27 = Severe depression   Psychosocial Evaluation and Intervention:  Psychosocial Evaluation - 04/27/24 1329       Psychosocial Evaluation & Interventions   Interventions Encouraged to exercise with the program and  follow exercise prescription    Comments Dorcas is coming to pulmonary rehab with Emphysema. She has done the program before but was unable to complete it. She states she could  tell that it made a difference before, so she is willing to try again to help her quality of life. Her medications are going well. She mentions needed a sleep study and is waiting for that to be scheduled. She states she doesn't have any concerns and just takes it day by day with her health.    Expected Outcomes Short: attend pulmonary rehab for education and exercise Long: develop and maintain positive self care habits    Continue Psychosocial Services  Follow up required by staff          Psychosocial Re-Evaluation:  Psychosocial Re-Evaluation     Row Name 07/08/24 1118 07/27/24 1114 08/31/24 1112         Psychosocial Re-Evaluation   Current issues with Current Depression;Current Anxiety/Panic;Current Sleep Concerns;Current Stress Concerns Current Sleep Concerns;Current Stress Concerns;Current Anxiety/Panic Current Sleep Concerns;Current Anxiety/Panic;Current Stress Concerns     Comments Mannat states that the majority of her stress comes from her health concerns and taking care of her family. She reports her stress has caused her not to be able to sleep very well and caused a loss of appetite. We reviewed her PHQ-9 questionnaire today and her score improved some but was still high. She states that she does not really have hobbies to help her relieve stress but does have [place]. Bela reports she is experiencing stress from health and family. She has anxiety that impacts her sleep. She only gets 4hrs of sleep. She says she does not have coping mechanisms. She says she would prefer to keep these things to herself. Encouraged her to continue to exercise as a way to relief stress and improve mental mood. Viha struggles with sleep, only getting around 3 hours per night. She struggles with anxiety, depression  or stress daily. She does not like to talk about it with others.     Expected Outcomes Short: Talk to doctor about sleep and anxiety. Long: Continue to exercise for mental boost. Short: Talk to doctor about sleep and anxiety. Long: Continue to exercise for mental boost. STG: continue to attend rehab. LTG: continue to exercise for mental boost     Interventions Encouraged to attend Pulmonary Rehabilitation for the exercise Encouraged to attend Pulmonary Rehabilitation for the exercise Encouraged to attend Pulmonary Rehabilitation for the exercise     Continue Psychosocial Services  Follow up required by staff Follow up required by staff Follow up required by staff        Psychosocial Discharge (Final Psychosocial Re-Evaluation):  Psychosocial Re-Evaluation - 08/31/24 1112       Psychosocial Re-Evaluation   Current issues with Current Sleep Concerns;Current Anxiety/Panic;Current Stress Concerns    Comments Christana struggles with sleep, only getting around 3 hours per night. She struggles with anxiety, depression or stress daily. She does not like to talk about it with others.    Expected Outcomes STG: continue to attend rehab. LTG: continue to exercise for mental boost    Interventions Encouraged to attend Pulmonary Rehabilitation for the exercise    Continue Psychosocial Services  Follow up required by staff          Education: Education Goals: Education classes will be provided on a weekly basis, covering required topics. Participant will state understanding/return demonstration of topics presented.  Learning Barriers/Preferences:  Learning Barriers/Preferences - 04/27/24 1307       Learning Barriers/Preferences   Learning Barriers None    Learning Preferences None          General  Pulmonary Education Topics:  Infection Prevention: - Provides verbal and written material to individual with discussion of infection control including proper hand washing and proper equipment  cleaning during exercise session. Flowsheet Row Pulmonary Rehab from 04/28/2024 in [facility] Cardiac and Pulmonary Rehab  Date 04/28/24  Educator MB  Instruction Review Code 1- Verbalizes Understanding    Falls Prevention: - Provides verbal and written material to individual with discussion of falls prevention and safety. Flowsheet Row Pulmonary Rehab from 04/28/2024 in [facility] Cardiac and Pulmonary Rehab  Date 04/28/24  Educator MB  Instruction Review Code 1- Verbalizes Understanding    Chronic Lung Disease Review: - Group verbal instruction with posters, models, PowerPoint presentations and videos,  to review new updates, new respiratory medications, new advancements in procedures and treatments. Providing information on websites and 800 numbers for continued self-education. Includes information about supplement oxygen, available portable oxygen systems, continuous and intermittent flow rates, oxygen safety, concentrators, and Medicare reimbursement for oxygen. Explanation of Pulmonary Drugs, including class, frequency, complications, importance of spacers, rinsing mouth after steroid MDI's, and proper cleaning methods for nebulizers. Review of basic lung anatomy and physiology related to function, structure, and complications of lung disease. Review of risk factors. Discussion about methods for diagnosing sleep apnea and types of masks and machines for OSA. Includes a review of the use of types of environmental controls: home humidity, furnaces, filters, dust mite/pet prevention, HEPA vacuums. Discussion about weather changes, air quality and the benefits of nasal washing. Instruction on Warning signs, infection symptoms, calling MD promptly, preventive modes, and value of vaccinations. Review of effective airway clearance, coughing and/or vibration techniques. Emphasizing that all should Create an Action Plan. Written material provided at class time. Flowsheet Row Pulmonary Rehab from 12/25/2022 in [facility]  Cardiac and Pulmonary Rehab  Education need identified 09/30/22    AED/CPR: - Group verbal and written instruction with the use of models to demonstrate the basic use of the AED with the basic ABC's of resuscitation.    Tests and Procedures:  - Group verbal and visual presentation and models provide information about basic cardiac anatomy and function. Reviews the testing methods done to diagnose heart disease and the outcomes of the test results. Describes the treatment choices: Medical Management, Angioplasty, or Coronary Bypass Surgery for treating various heart conditions including Myocardial Infarction, Angina, Valve Disease, and Cardiac Arrhythmias.  Written material provided at class time. Flowsheet Row Pulmonary Rehab from 12/25/2022 in [facility] Cardiac and Pulmonary Rehab  Date 12/04/22  Educator SB  Instruction Review Code 1- Verbalizes Understanding    Medication Safety: - Group verbal and visual instruction to review commonly prescribed medications for heart and lung disease. Reviews the medication, class of the drug, and side effects. Includes the steps to properly store meds and maintain the prescription regimen.  Written material given at graduation. Flowsheet Row Pulmonary Rehab from 12/25/2022 in [facility] Cardiac and Pulmonary Rehab  Date 10/09/22  Educator SB  Instruction Review Code 1- Verbalizes Understanding    Other: -Provides group and verbal instruction on various topics (see comments)   Knowledge Questionnaire Score:  Knowledge Questionnaire Score - 09/07/24 1108       Knowledge Questionnaire Score   Pre Score 16/18    Post Score 12/18           Core Components/Risk Factors/Patient Goals at Admission:  Personal Goals and Risk Factors at Admission - 04/28/24 1548       Core Components/Risk Factors/Patient Goals on Admission    Weight Management Yes;Weight  Loss    Intervention Weight Management: Develop a combined nutrition and exercise program designed to  reach desired caloric intake, while maintaining appropriate intake of nutrient and fiber, sodium and fats, and appropriate energy expenditure required for the weight goal.;Weight Management: Provide education and appropriate resources to help participant work on and attain dietary goals.;Weight Management/Obesity: Establish reasonable short term and long term weight goals.    Admit Weight 165 lb 12.8 oz (75.2 kg)    Goal Weight: Short Term 147 lb 8 oz (66.9 kg)    Goal Weight: Long Term 130 lb (59 kg)    Expected Outcomes Short Term: Continue to assess and modify interventions until short term weight is achieved;Long Term: Adherence to nutrition and physical activity/exercise program aimed toward attainment of established weight goal;Weight Loss: Understanding of general recommendations for a balanced deficit meal plan, which promotes 1-2 lb weight loss per week and includes a negative energy balance of [phone] kcal/d;Understanding recommendations for meals to include 15-35% energy as protein, 25-35% energy from fat, 35-60% energy from carbohydrates, less than 200mg  of dietary cholesterol, 20-35 gm of total fiber daily;Understanding of distribution of calorie intake throughout the day with the consumption of 4-5 meals/snacks    Improve shortness of breath with ADL's Yes    Intervention Provide education, individualized exercise plan and daily activity instruction to help decrease symptoms of SOB with activities of daily living.    Expected Outcomes Short Term: Improve cardiorespiratory fitness to achieve a reduction of symptoms when performing ADLs;Long Term: Be able to perform more ADLs without symptoms or delay the onset of symptoms    Increase knowledge of respiratory medications and ability to use respiratory devices properly  Yes    Intervention Provide education and demonstration as needed of appropriate use of medications, inhalers, and oxygen therapy.    Expected Outcomes Short Term: Achieves  understanding of medications use. Understands that oxygen is a medication prescribed by physician. Demonstrates appropriate use of inhaler and oxygen therapy.;Long Term: Maintain appropriate use of medications, inhalers, and oxygen therapy.    Hypertension Yes    Intervention Provide education on lifestyle modifcations including regular physical activity/exercise, weight management, moderate sodium restriction and increased consumption of fresh fruit, vegetables, and low fat dairy, alcohol moderation, and smoking cessation.;Monitor prescription use compliance.    Expected Outcomes Short Term: Continued assessment and intervention until BP is < 140/55mm HG in hypertensive participants. < 130/24mm HG in hypertensive participants with diabetes, heart failure or chronic kidney disease.;Long Term: Maintenance of blood pressure at goal levels.          Education:Diabetes - Individual verbal and written instruction to review signs/symptoms of diabetes, desired ranges of glucose level fasting, after meals and with exercise. Acknowledge that pre and post exercise glucose checks will be done for 3 sessions at entry of program. Flowsheet Row Pulmonary Rehab from 12/25/2022 in [facility] Cardiac and Pulmonary Rehab  Date 09/30/22  Educator [facility]  Instruction Review Code 1- Verbalizes Understanding    Know Your Numbers and Heart Failure: - Group verbal and visual instruction to discuss disease risk factors for cardiac and pulmonary disease and treatment options.  Reviews associated critical values for Overweight/Obesity, Hypertension, Cholesterol, and Diabetes.  Discusses basics of heart failure: signs/symptoms and treatments.  Introduces Heart Failure Zone chart for action plan for heart failure. Written material provided at class time. Flowsheet Row Pulmonary Rehab from 12/25/2022 in [facility] Cardiac and Pulmonary Rehab  Date 12/25/22  Educator [facility]  Instruction Review Code 1- Verbalizes Understanding  Core  Components/Risk Factors/Patient Goals Review:   Goals and Risk Factor Review     Row Name 07/08/24 1112 07/27/24 1120 08/31/24 1117         Core Components/Risk Factors/Patient Goals Review   Personal Goals Review Weight Management/Obesity;Improve shortness of breath with ADL's;Hypertension Improve shortness of breath with ADL's Improve shortness of breath with ADL's     Review Dalexa states that she would like to lose some weight with a goal of getting down to 115 lb. She weighed in today at 162.7 lb. She is only eating one meal a day and does not understand why she is not losing weight. She states that she was able to breath better when she was previously at a lower weight. She has been checking her BP at home with her personal BP cuff but reports that her readings have been low. She states that her systolic readings are usually in the 90's and her diastolic readings have been in the 50's. We encouraged her to speak with her doctor about her low BP readings. She reports continuing to take all of her medications as prescribed. Anarosa reports she has seen improvement in breathing since starting the program. Encouraged her to continue to exercise at home and independently of rehab to keep breathing improvement Charlaine reports she has seen some improvements in breathing during ADLs, but mostly feels the rehab has helped prevent it from becoming worse.     Expected Outcomes Short: Talk to doctor about low BP readings. Long: Conitnue to manage lifestyle risk factors. Short: continue to exercise post at home. Long: Conitnue to manage lifestyle risk factors. STG: Continue to exercise at home and independently. LTG: Continue to manage lifestyle risk factors.        Core Components/Risk Factors/Patient Goals at Discharge (Final Review):   Goals and Risk Factor Review - 08/31/24 1117       Core Components/Risk Factors/Patient Goals Review   Personal Goals Review Improve shortness of breath with ADL's     Review Allante reports she has seen some improvements in breathing during ADLs, but mostly feels the rehab has helped prevent it from becoming worse.    Expected Outcomes STG: Continue to exercise at home and independently. LTG: Continue to manage lifestyle risk factors.          ITP Comments:  ITP Comments     Row Name 04/27/24 1326 04/28/24 1537 05/04/24 1053 05/26/24 0942 06/23/24 0941   ITP Comments Initial phone call completed. Diagnosis can be found in [facility] 6/12. EP Orientation scheduled for Wednesday 7/2 at 1:30. Completed and gym orientation for respiratory care services. Initial ITP created and sent for review to Dr. Faud Aleskerov, Medical Director. First full day of exercise!  Patient was oriented to gym and equipment including functions, settings, policies, and procedures.  Patient's individual exercise prescription and treatment plan were reviewed.  All starting workloads were established based on the results of the 6 minute walk test done at initial orientation visit.  The plan for exercise progression was also introduced and progression will be customized based on patient's performance and goals. 30 Day review completed. Medical Director ITP review done, changes made as directed, and signed approval by Medical Director. new to program. 30 Day review completed. Medical Director ITP review done; changes made as directed and signed approval by Medical Director.    Row Name 07/21/24 1406 08/18/24 1029 09/15/24 1143       ITP Comments 30 Day review completed. Medical Director  ITP review done; changes made as directed and signed approval by Medical Director. 30 Day review completed. Medical Director ITP review done; changes made as directed and signed approval by Medical Director. 30 Day review completed. Medical Director ITP review done, changes made as directed, and signed approval by Medical Director.        Comments: 30 day review ITP

## 2024-09-15 NOTE — Plan of Care (Signed)
  Problem: Education: Goal: Knowledge of General Education information will improve Description: Including pain rating scale, medication(s)/side effects and non-pharmacologic comfort measures Outcome: Progressing   Problem: Clinical Measurements: Goal: Ability to maintain clinical measurements within normal limits will improve Outcome: Progressing Goal: Will remain free from infection Outcome: Progressing Goal: Diagnostic test results will improve Outcome: Progressing Goal: Respiratory complications will improve Outcome: Progressing Goal: Cardiovascular complication will be avoided Outcome: Progressing   Problem: Activity: Goal: Risk for activity intolerance will decrease Outcome: Progressing   Problem: Nutrition: Goal: Adequate nutrition will be maintained Outcome: Progressing   Problem: Pain Managment: Goal: General experience of comfort will improve and/or be controlled Outcome: Progressing   Problem: Safety: Goal: Ability to remain free from injury will improve Outcome: Progressing

## 2024-09-15 NOTE — Plan of Care (Signed)
  Problem: Clinical Measurements: Goal: Ability to maintain clinical measurements within normal limits will improve Outcome: Progressing Goal: Will remain free from infection Outcome: Progressing Goal: Diagnostic test results will improve Outcome: Progressing Goal: Respiratory complications will improve Outcome: Progressing Goal: Cardiovascular complication will be avoided Outcome: Progressing   Problem: Activity: Goal: Risk for activity intolerance will decrease Outcome: Progressing   Problem: Nutrition: Goal: Adequate nutrition will be maintained Outcome: Progressing   Problem: Coping: Goal: Level of anxiety will decrease Outcome: Progressing   Problem: Elimination: Goal: Will not experience complications related to bowel motility Outcome: Progressing Goal: Will not experience complications related to urinary retention Outcome: Progressing   Problem: Pain Managment: Goal: General experience of comfort will improve and/or be controlled Outcome: Progressing   Problem: Safety: Goal: Ability to remain free from injury will improve Outcome: Progressing

## 2024-09-15 NOTE — Discharge Instructions (Signed)

## 2024-09-15 NOTE — Progress Notes (Signed)
 [facility] Progress Note  Patient: Lauren Escobar    FMW:[id number]  DOA: 09/14/2024     Date of Service: the patient was seen and examined on 09/15/2024  Chief Complaint  Patient presents with   Chest Pain   Brief hospital course: Lauren Escobar is a 58 y.o. female with a known history of anxiety, asthma, COPD, CKD, DM, depression, HTN, HLD, vonWillebrand's, polycythemia vera  presents to the emergency department for evaluation of SOB and central chest pain.  Patient was in a usual state of health until this morning she reports that she woke up and had sudden onset of centralized chest pain that radiates to the left side associated with shortness of breath numbness and tingling in her hands.  Symptoms were refractory to home nebulizer treatments.  At present she reports continued left upper back pain and shortness of breath.   She received 4 aspirin  and 2 sprays of nitroglycerin  via EMS.   Of note patient was lysed in August 2025 with metapneumovirus   Patient denies fevers/chills, weakness, dizziness,  N/V/C/D, abdominal pain, dysuria/frequency, changes in mental status.    Otherwise there has been no change in status. Patient has been taking medication as prescribed and there has been no recent change in medication or diet.  No recent antibiotics.  There has been no recent illness, travel or sick contacts.     EMS/ED Course: Patient received Toradol , methylprednisolone , magnesium , albuterol  and DuoNeb. Medical admission has been requested for further management of COPD exacerbation, chest pain rule out ACS.     Assessment and Plan:  # Acute hypoxic respiratory failure # Acute COPD exacerbation,  - s/p IV Solu-Medrol  80 mg, decreased to 40 mg IV daily - DuoNebs and as needed albuterol  --D-dimer negative, less likely PE --Negative RSV/flu and COVID Follow respiratory viral panel -Continue Trelegy, cetirizine - Continue CPAP as needed for work of breathing Continue  supplemental O2 inhalation and gradually wean off    # Chest pain, rule out ACS - Trend troponins, check lipids and TSH. - Continue aspirin , Imdur , propranolol , atorvastatin  - Recent echo done on November 3 revealed LVEF of 55 to 60% with mild LVH and grade 1 diastolic dysfunction   # HTN - Continue propranolol , Imdur  with holding parameters   # GERD - Stable, continue PPI   # Anxiety/depression -Continue buspirone  and sertraline   # History of polycythemia vera - Continue hydroxyurea  - Follow up with hematology as outpatient   # History of Von Willebrands - SCDs and early ambulation for DVT prophylaxis.   # Vitamin B12 level 367, goal >400.  Started vitamin B12 oral supplement.  Follow-up with PCP to repeat vitamin B12 level after 3 to 6 months.    Body mass index is 32.32 kg/m.  Interventions:  Diet: Heart healthy/carb modified diet DVT Prophylaxis: Subcutaneous Lovenox    Advance goals of care discussion: Full code  Family Communication: family was not present at bedside, at the time of interview.  The pt provided permission to discuss medical plan with the family. Opportunity was given to ask question and all questions were answered satisfactorily.   Disposition:  Pt is from Home, admitted with resp failure, copd, still has sob, which precludes a safe discharge. Discharge to home, when stable, may need few days to improve.  Subjective: No significant events overnight, patient is feeling improvement in the shortness of breath, still has dry cough, no phlegm production.  Denies any chest pain hypertension, no nasal complaints.  Physical Exam: General:  NAD, lying comfortably Appear in no distress, affect appropriate Eyes: PERRLA ENT: Oral Mucosa Clear, moist  Neck: no JVD,  Cardiovascular: S1 and S2 Present, no Murmur,  Respiratory: good respiratory effort, Bilateral Air entry equal and Decreased, no Crackles, mild occasional wheezes Abdomen: Bowel Sound present,  Soft and no tenderness,  Skin: no rashes Extremities: no Pedal edema, no calf tenderness Neurologic: without any new focal findings Gait not checked due to patient safety concerns  Vitals:   09/15/24 0518 09/15/24 0723 09/15/24 0749 09/15/24 0859  BP: 104/67  114/72   Pulse: 78  75   Resp:   (!) 25   Temp:   97.7 F (36.5 C)   TempSrc:      SpO2:  99% 100% 97%  Weight:      Height:        Intake/Output Summary (Last 24 hours) at 09/15/2024 1656 Last data filed at 09/15/2024 0300 Gross per 24 hour  Intake 375 ml  Output --  Net 375 ml   Filed Weights   09/14/24 1442  Weight: 72.6 kg    Data Reviewed: I have personally reviewed and interpreted daily labs, tele strips, imagings as discussed above. I reviewed all nursing notes, pharmacy notes, vitals, pertinent old records I have discussed plan of care as described above with RN and patient/family.  CBC: Recent Labs  Lab 09/14/24 1445 09/15/24 0537  WBC 8.9 7.6  HGB 13.5 12.5  HCT 37.9 35.5*  MCV 102.7* 104.1*  PLT 278 267   Basic Metabolic Panel: Recent Labs  Lab 09/14/24 1445 09/15/24 0537  NA 137 137  K 3.7 4.3  CL 103 104  CO2 22 21*  GLUCOSE 103* 162*  BUN 11 19  CREATININE 0.62 0.72  CALCIUM  9.4 9.2  MG  --  2.7*  PHOS  --  3.5    Studies: No results found.  Scheduled Meds:  aspirin  EC  81 mg Oral Daily   atorvastatin   20 mg Oral QHS   budesonide -glycopyrrolate -formoterol   2 puff Inhalation BID   busPIRone   7.5 mg Oral TID   cyclobenzaprine   10 mg Oral QHS   folic acid   1 mg Oral Daily   hydroxyurea   500 mg Oral BID   insulin  aspart  0-20 Units Subcutaneous TID WC   insulin  aspart  0-5 Units Subcutaneous QHS   ipratropium-albuterol   3 mL Nebulization BID   isosorbide  mononitrate  30 mg Oral Daily   loratadine   10 mg Oral Daily   methylPREDNISolone  (SOLU-MEDROL ) injection  80 mg Intravenous Q24H   multivitamin   Oral Daily   pantoprazole   40 mg Oral Daily   propranolol   40 mg Oral BID    sertraline  100 mg Oral Daily   sodium chloride  flush  3 mL Intravenous Q12H   Continuous Infusions:  sodium chloride  75 mL/hr at 09/15/24 1538   PRN Meds: acetaminophen  **OR** acetaminophen , benzonatate , bisacodyl, hydrALAZINE , HYDROcodone -acetaminophen , morphine  injection, ondansetron  **OR** ondansetron  (ZOFRAN ) IV, senna-docusate  Time spent: 35 minutes  Author: ELVAN SOR. MD [facility] 09/15/2024 4:56 PM  To reach On-call, see care teams to locate the attending and reach out to them via [email or web address]. If 7PM-7AM, please contact night-coverage If you still have difficulty reaching the attending provider, please page the [facility] (Director on Call) for [facility] on amion for assistance.

## 2024-09-15 NOTE — Telephone Encounter (Signed)
 Patient Product/process Development Scientist completed.    The patient is insured through [facility]. Patient has Medicare and is not eligible for a copay card, but may be able to apply for patient assistance or Medicare RX Payment Plan (Patient Must reach out to their plan, if eligible for payment plan), if available.    Ran test claim for [employer]  and the current 30 day co-pay is $0.00.   This test claim was processed through [facility]- copay amounts may vary at other pharmacies due to pharmacy/plan contracts, or as the patient moves through the different stages of their insurance plan.     Reyes Sharps, CPHT Pharmacy Technician Patient Advocate Specialist Lead [facility] Patient Advocate Team Direct Number: [phone]  Fax: [phone]

## 2024-09-16 ENCOUNTER — Encounter

## 2024-09-16 DIAGNOSIS — J441 Chronic obstructive pulmonary disease with (acute) exacerbation: Secondary | ICD-10-CM | POA: Diagnosis not present

## 2024-09-16 LAB — GLUCOSE, CAPILLARY
Glucose-Capillary: 97 mg/dL (ref 70–99)
Glucose-Capillary: 143 mg/dL — ABNORMAL HIGH (ref 70–99)
Glucose-Capillary: 159 mg/dL — ABNORMAL HIGH (ref 70–99)
Glucose-Capillary: 99 mg/dL (ref 70–99)

## 2024-09-16 LAB — BASIC METABOLIC PANEL WITH GFR
Anion gap: 7 (ref 5–15)
BUN: 17 mg/dL (ref 6–20)
CO2: 24 mmol/L (ref 22–32)
Calcium: 8.5 mg/dL — ABNORMAL LOW (ref 8.9–10.3)
Chloride: 110 mmol/L (ref 98–111)
Creatinine, Ser: 0.61 mg/dL (ref 0.44–1.00)
GFR, Estimated: >60 mL/min (ref >60)
Glucose, Bld: 104 mg/dL — ABNORMAL HIGH (ref 70–99)
Potassium: 3.9 mmol/L (ref 3.5–5.1)
Sodium: 140 mmol/L (ref 135–145)

## 2024-09-16 LAB — CBC
HCT: 34.1 % — ABNORMAL LOW (ref 36.0–46.0)
Hemoglobin: 11.5 g/dL — ABNORMAL LOW (ref 12.0–15.0)
MCH: 36.4 pg — ABNORMAL HIGH (ref 26.0–34.0)
MCHC: 33.7 g/dL (ref 30.0–36.0)
MCV: 107.9 fL — ABNORMAL HIGH (ref 80.0–100.0)
Platelets: 251 K/uL (ref 150–400)
RBC: 3.16 MIL/uL — ABNORMAL LOW (ref 3.87–5.11)
RDW: 13.6 % (ref 11.5–15.5)
WBC: 11.6 K/uL — ABNORMAL HIGH (ref 4.0–10.5)
nRBC: 0 % (ref 0.0–0.2)

## 2024-09-16 LAB — PHOSPHORUS: Phosphorus: 3 mg/dL (ref 2.5–4.6)

## 2024-09-16 LAB — MAGNESIUM: Magnesium: 2.1 mg/dL (ref 1.7–2.4)

## 2024-09-16 MED ORDER — HYDROXYZINE HCL 25 MG PO TABS
25.0000 mg | ORAL_TABLET | Freq: Three times a day (TID) | ORAL | Status: AC
  Administered 2024-09-16 (×2): 25 mg via ORAL
  Filled 2024-09-16 (×2): qty 1

## 2024-09-16 MED ORDER — GUAIFENESIN ER 600 MG PO TB12
600.0000 mg | ORAL_TABLET | Freq: Two times a day (BID) | ORAL | Status: DC
  Administered 2024-09-16 – 2024-09-17 (×2): 600 mg via ORAL
  Filled 2024-09-16 (×2): qty 1

## 2024-09-16 MED ORDER — HYDROCOD POLI-CHLORPHE POLI ER 10-8 MG/5ML PO SUER
5.0000 mL | Freq: Two times a day (BID) | ORAL | Status: DC | PRN
  Administered 2024-09-16 – 2024-09-17 (×2): 5 mL via ORAL
  Filled 2024-09-16 (×2): qty 5

## 2024-09-16 MED ORDER — IPRATROPIUM-ALBUTEROL 0.5-2.5 (3) MG/3ML IN SOLN: 3.0000 mL | Freq: Four times a day (QID) | RESPIRATORY_TRACT | Status: DC | PRN

## 2024-09-16 NOTE — Care Management Important Message (Signed)
 Important Message  Patient Details  Name: Lauren Escobar MRN: [id number] Date of Birth: 28-Aug-1966   Important Message Given:  Yes - Medicare IM     [name] 09/16/2024, 3:06 PM

## 2024-09-16 NOTE — Progress Notes (Signed)
 [facility] Progress Note  Patient: Lauren Escobar    FMW:[id number]  DOA: 09/14/2024     Date of Service: the patient was seen and examined on 09/16/2024  Chief Complaint  Patient presents with   Chest Pain   Brief hospital course: Lauren Escobar is a 58 y.o. female with a known history of anxiety, asthma, COPD, CKD, DM, depression, HTN, HLD, vonWillebrand's, polycythemia vera  presents to the emergency department for evaluation of SOB and central chest pain.  Patient was in a usual state of health until this morning she reports that she woke up and had sudden onset of centralized chest pain that radiates to the left side associated with shortness of breath numbness and tingling in her hands.  Symptoms were refractory to home nebulizer treatments.  At present she reports continued left upper back pain and shortness of breath.   She received 4 aspirin  and 2 sprays of nitroglycerin  via EMS.   Of note patient was lysed in August 2025 with metapneumovirus   Patient denies fevers/chills, weakness, dizziness,  N/V/C/D, abdominal pain, dysuria/frequency, changes in mental status.    Otherwise there has been no change in status. Patient has been taking medication as prescribed and there has been no recent change in medication or diet.  No recent antibiotics.  There has been no recent illness, travel or sick contacts.     EMS/ED Course: Patient received Toradol , methylprednisolone , magnesium , albuterol  and DuoNeb. Medical admission has been requested for further management of COPD exacerbation, chest pain rule out ACS.     Assessment and Plan:  # Acute hypoxic respiratory failure # Acute COPD exacerbation,  - s/p IV Solu-Medrol  80 mg, decreased to 40 mg IV daily - DuoNebs and as needed albuterol  --D-dimer negative, less likely PE --Negative RSV/flu and COVID --Respiratory viral panel was ordered on admission but was never sent.  Unknown reason  -Continue Trelegy,  cetirizine --DuoNeb every 6 hourly --Mucinex  600 mg p.o. twice daily, Tussionex as needed for persistent cough - Continue CPAP as needed for work of breathing Continue supplemental O2 inhalation and gradually wean off    # Chest pain most likely musculoskeletal,  ACS ruled out. Troponin remained negative Lipid profile, triglyceride 218, LDL 93 and TSH 1.43 wnl - Continue aspirin , Imdur , propranolol , atorvastatin  - Recent echo done on November 3 revealed LVEF of 55 to 60% with mild LVH and grade 1 diastolic dysfunction   # HTN - Continue propranolol , Imdur  with holding parameters   # GERD - Stable, continue PPI   # Anxiety/depression -Continue buspirone  and sertraline 11/20 Atarax 25 mg 3 times daily x 2 doses ordered   # History of polycythemia vera - Continue hydroxyurea  - Follow up with hematology as outpatient   # History of Von Willebrands - SCDs and early ambulation for DVT prophylaxis.   # Vitamin B12 level 367, goal >400.  Started vitamin B12 oral supplement.  Follow-up with PCP to repeat vitamin B12 level after 3 to 6 months.    Body mass index is 32.32 kg/m.  Interventions:  Diet: Heart healthy/carb modified diet DVT Prophylaxis: Subcutaneous Lovenox    Advance goals of care discussion: Full code  Family Communication: family was not present at bedside, at the time of interview.  The pt provided permission to discuss medical plan with the family. Opportunity was given to ask question and all questions were answered satisfactorily.   Disposition:  Pt is from Home, admitted with resp failure, copd, still has sob, which precludes a  safe discharge. Discharge to home, when stable, may need few days to improve.  Subjective: No significant events overnight, patient's shortness of breath is improving, still has cough but no phlegm.  Denied any other complaints.  Physical Exam: General: NAD, lying comfortably Appear in no distress, affect appropriate Eyes:  PERRLA ENT: Oral Mucosa Clear, moist  Neck: no JVD,  Cardiovascular: S1 and S2 Present, no Murmur,  Respiratory: good respiratory effort, Bilateral Air entry equal and Decreased, no Crackles, mild occasional wheezes Abdomen: Bowel Sound present, Soft and no tenderness,  Skin: no rashes Extremities: no Pedal edema, no calf tenderness Neurologic: without any new focal findings Gait not checked due to patient safety concerns  Vitals:   09/15/24 2136 09/16/24 0508 09/16/24 0808 09/16/24 1620  BP: 115/66 106/71 114/70 107/83  Pulse: 67 70 64 72  Resp:  16  20  Temp:  97.9 F (36.6 C) 98.2 F (36.8 C) 97.9 F (36.6 C)  TempSrc:   Oral   SpO2:  98% 98% 100%  Weight:      Height:        Intake/Output Summary (Last 24 hours) at 09/16/2024 1637 Last data filed at 09/16/2024 1300 Gross per 24 hour  Intake 240 ml  Output --  Net 240 ml   Filed Weights   09/14/24 1442  Weight: 72.6 kg    Data Reviewed: I have personally reviewed and interpreted daily labs, tele strips, imagings as discussed above. I reviewed all nursing notes, pharmacy notes, vitals, pertinent old records I have discussed plan of care as described above with RN and patient/family.  CBC: Recent Labs  Lab 09/14/24 1445 09/15/24 0537 09/16/24 0617  WBC 8.9 7.6 11.6*  HGB 13.5 12.5 11.5*  HCT 37.9 35.5* 34.1*  MCV 102.7* 104.1* 107.9*  PLT 278 267 251   Basic Metabolic Panel: Recent Labs  Lab 09/14/24 1445 09/15/24 0537 09/16/24 0617  NA 137 137 140  K 3.7 4.3 3.9  CL 103 104 110  CO2 22 21* 24  GLUCOSE 103* 162* 104*  BUN 11 19 17   CREATININE 0.62 0.72 0.61  CALCIUM  9.4 9.2 8.5*  MG  --  2.7* 2.1  PHOS  --  3.5 3.0    Studies: No results found.  Scheduled Meds:  aspirin  EC  81 mg Oral Daily   atorvastatin   20 mg Oral QHS   budesonide -glycopyrrolate -formoterol   2 puff Inhalation BID   busPIRone   7.5 mg Oral TID   vitamin B-12  500 mcg Oral Daily   cyclobenzaprine   10 mg Oral QHS    enoxaparin  (LOVENOX ) injection  40 mg Subcutaneous QPM   folic acid   1 mg Oral Daily   hydroxyurea   500 mg Oral BID   hydrOXYzine  25 mg Oral TID   insulin  aspart  0-20 Units Subcutaneous TID WC   insulin  aspart  0-5 Units Subcutaneous QHS   isosorbide  mononitrate  30 mg Oral Daily   loratadine   10 mg Oral Daily   methylPREDNISolone  (SOLU-MEDROL ) injection  40 mg Intravenous Q24H   multivitamin   Oral Daily   pantoprazole   40 mg Oral Daily   propranolol   40 mg Oral BID   sertraline  100 mg Oral Daily   sodium chloride  flush  3 mL Intravenous Q12H   Continuous Infusions:   PRN Meds: acetaminophen  **OR** acetaminophen , benzonatate , bisacodyl, hydrALAZINE , HYDROcodone -acetaminophen , ipratropium-albuterol , morphine  injection, ondansetron  **OR** ondansetron  (ZOFRAN ) IV, senna-docusate  Time spent: 35 minutes  Author: ELVAN SOR. MD [facility] 09/16/2024 4:37  PM  To reach On-call, see care teams to locate the attending and reach out to them via [email or web address]. If 7PM-7AM, please contact night-coverage If you still have difficulty reaching the attending provider, please page the [facility] (Director on Call) for [facility] on amion for assistance.

## 2024-09-16 NOTE — Plan of Care (Signed)
  Problem: Education: Goal: Knowledge of General Education information will improve Description: Including pain rating scale, medication(s)/side effects and non-pharmacologic comfort measures Outcome: Progressing   Problem: Clinical Measurements: Goal: Ability to maintain clinical measurements within normal limits will improve Outcome: Progressing Goal: Will remain free from infection Outcome: Progressing Goal: Diagnostic test results will improve Outcome: Progressing Goal: Respiratory complications will improve Outcome: Progressing Goal: Cardiovascular complication will be avoided Outcome: Progressing   Problem: Activity: Goal: Risk for activity intolerance will decrease Outcome: Progressing   Problem: Nutrition: Goal: Adequate nutrition will be maintained Outcome: Progressing   Problem: Coping: Goal: Level of anxiety will decrease Outcome: Progressing   Problem: Pain Managment: Goal: General experience of comfort will improve and/or be controlled Outcome: Progressing   Problem: Safety: Goal: Ability to remain free from injury will improve Outcome: Progressing

## 2024-09-16 NOTE — Progress Notes (Signed)
 Mobility Specialist - Progress Note  ON 2 L OF O2 Pre-mobility: HR-69, SpO2-99%  During mobility: HR-78,  SpO2-99%  Post-mobility: HR-90, SPO2-98%   09/16/24 1620  Therapy Vitals  Temp 97.9 F (36.6 C)  Pulse Rate 72  Resp 20  BP 107/83  Patient Position (if appropriate) Lying  Oxygen Therapy  SpO2 100 %  Mobility  Activity Ambulated with assistance;Stood at bedside;Dangled on edge of bed  Level of Assistance Standby assist, set-up cues, supervision of patient - no hands on  Assistive Device None (O2 Tank)  Distance Ambulated (ft) 40 ft  Range of Motion/Exercises Active Assistive;All extremities  Activity Response Tolerated fair  Mobility visit 1 Mobility  Mobility Specialist Start Time (ACUTE ONLY) 1559  Mobility Specialist Stop Time (ACUTE ONLY) 1616  Mobility Specialist Time Calculation (min) (ACUTE ONLY) 17 min   Pt was supine in bed on O2 @ 3L with guest in the bed upon entry. Pt agreed to mobility. Pt did describe LB pain. Pt was able to get to the EOB independently with bed features. Pt is able to STS independently with no AD. Pt ambulated well throughout activity. Pt did need recovery breaks to control breathing pattern. Pt O2 vitals remained WNL. After activity pt returned to the room back in bed on O2 @ 3L with needs in reach and guest in the room. Nurse was in the room upon exit.  Lauren Escobar Mobility Specialist 09/16/24, 4:30 PM

## 2024-09-16 NOTE — Progress Notes (Signed)
 Nurse tech ambulated patient in hallway on room air. Patient remained at 99-100% during ambulation test. MD notified.

## 2024-09-16 NOTE — Plan of Care (Signed)

## 2024-09-17 ENCOUNTER — Other Ambulatory Visit: Payer: Self-pay

## 2024-09-17 LAB — CBC
HCT: 35.4 % — ABNORMAL LOW (ref 36.0–46.0)
Hemoglobin: 11.9 g/dL — ABNORMAL LOW (ref 12.0–15.0)
MCH: 36.8 pg — ABNORMAL HIGH (ref 26.0–34.0)
MCHC: 33.6 g/dL (ref 30.0–36.0)
MCV: 109.6 fL — ABNORMAL HIGH (ref 80.0–100.0)
Platelets: 224 K/uL (ref 150–400)
RBC: 3.23 MIL/uL — ABNORMAL LOW (ref 3.87–5.11)
RDW: 13.1 % (ref 11.5–15.5)
WBC: 8.4 K/uL (ref 4.0–10.5)
nRBC: 0 % (ref 0.0–0.2)

## 2024-09-17 LAB — BASIC METABOLIC PANEL WITH GFR
Anion gap: 11 (ref 5–15)
BUN: 13 mg/dL (ref 6–20)
CO2: 24 mmol/L (ref 22–32)
Calcium: 8.5 mg/dL — ABNORMAL LOW (ref 8.9–10.3)
Chloride: 106 mmol/L (ref 98–111)
Creatinine, Ser: 0.63 mg/dL (ref 0.44–1.00)
GFR, Estimated: >60 mL/min (ref >60)
Glucose, Bld: 91 mg/dL (ref 70–99)
Potassium: 3.6 mmol/L (ref 3.5–5.1)
Sodium: 141 mmol/L (ref 135–145)

## 2024-09-17 LAB — MAGNESIUM: Magnesium: 1.9 mg/dL (ref 1.7–2.4)

## 2024-09-17 LAB — PHOSPHORUS: Phosphorus: 3.3 mg/dL (ref 2.5–4.6)

## 2024-09-17 LAB — GLUCOSE, CAPILLARY: Glucose-Capillary: 105 mg/dL — ABNORMAL HIGH (ref 70–99)

## 2024-09-17 MED ORDER — CYANOCOBALAMIN 500 MCG PO TABS
500.0000 ug | ORAL_TABLET | Freq: Every day | ORAL | 2 refills | Status: AC
  Filled 2024-09-17: qty 30, 30d supply, fill #0

## 2024-09-17 MED ORDER — PREDNISONE 20 MG PO TABS
ORAL_TABLET | ORAL | 0 refills | Status: AC
  Filled 2024-09-17: qty 5, 4d supply, fill #0

## 2024-09-17 MED ORDER — DEXTROMETHORPHAN-GUAIFENESIN 20-200 MG/20ML PO LIQD
10.0000 mL | Freq: Four times a day (QID) | ORAL | 0 refills | Status: AC | PRN
  Filled 2024-09-17: qty 236, 4d supply, fill #0

## 2024-09-17 MED ORDER — ALBUTEROL SULFATE HFA 108 (90 BASE) MCG/ACT IN AERS
2.0000 | INHALATION_SPRAY | Freq: Four times a day (QID) | RESPIRATORY_TRACT | 2 refills | Status: AC | PRN
  Filled 2024-09-17: qty 18, 25d supply, fill #0

## 2024-09-17 MED ORDER — OXYCODONE HCL 5 MG PO TABS
5.0000 mg | ORAL_TABLET | Freq: Two times a day (BID) | ORAL | 0 refills | Status: AC | PRN
  Filled 2024-09-17: qty 10, 5d supply, fill #0

## 2024-09-17 NOTE — Discharge Summary (Signed)
 [facility] Discharge Summary   Patient: Lauren Escobar FMW:[id number]  PCP: Kandis Stefano Iles, MD  Date of admission: 09/14/2024   Date of discharge: 09/17/2024 09/17/2024     Discharge Diagnoses:  Principal Problem:   COPD (chronic obstructive pulmonary disease) (HCC) Active Problems:   Acute respiratory failure with hypoxia (HCC)   Admitted From: Home Disposition:  Home   Recommendations for Outpatient Follow-up:  Follow-up with PCP in 1 week Follow-up with pulmonary in 1 to 2 weeks for PFTs Repeat vitamin B12 level in between 3 to 6 months Follow up LABS/TEST:  as above   Follow-up Information     Kandis Stefano Iles, MD. Krista on 09/28/2024.   Specialty: Family Medicine Why: Go at 2:00pm. Contact information: [address] [place] [address] [phone]         [name], MD. Go on 09/29/2024.   Specialty: Pulmonary Disease Why: Go at 11:30am. Contact information: [address] [place] [address] [phone]                Diet recommendation: Cardiac diet  Activity: The patient is advised to gradually reintroduce usual activities, as tolerated  Discharge Condition: stable  Code Status: Full code   History of present illness: As per the H and P dictated on admission.  Hospital Course:  Lauren Escobar is a 58 y.o. female with a known history of anxiety, asthma, COPD, CKD, DM, depression, HTN, HLD, vonWillebrand's, polycythemia vera  presents to the emergency department for evaluation of SOB and central chest pain.  Patient was in a usual state of health until this morning she reports that she woke up and had sudden onset of centralized chest pain that radiates to the left side associated with shortness of breath numbness and tingling in her hands.  Symptoms were refractory to home nebulizer treatments.  At present she reports continued left upper back pain and shortness of breath.   She received 4  aspirin  and 2 sprays of nitroglycerin  via EMS.   Of note patient was lysed in August 2025 with metapneumovirus   Patient denies fevers/chills, weakness, dizziness,  N/V/C/D, abdominal pain, dysuria/frequency, changes in mental status.    Otherwise there has been no change in status. Patient has been taking medication as prescribed and there has been no recent change in medication or diet.  No recent antibiotics.  There has been no recent illness, travel or sick contacts.     EMS/ED Course: Patient received Toradol , methylprednisolone , magnesium , albuterol  and DuoNeb. Medical admission has been requested for further management of COPD exacerbation, chest pain rule out ACS.    Assessment and Plan:   # Acute hypoxic respiratory failure: Resolved # Acute COPD exacerbation,  s/p IV Solu-Medrol  80 mg, decreased to 40 mg IV daily DuoNeb every 6 hourly and continued Trelegy inhaler and cetirizine D-dimer negative, less likely PE, Negative RSV/flu and COVID --Respiratory viral panel was ordered on admission but was never sent.  Unknown reason.   S/p Mucinex  600 mg p.o. twice daily, Tussionex prn for persistent cough - s/p CPAP prn for work of breathing.  Improved Supplemental O2 in edition gradually weaned off, currently patient is saturating well on room air. Patient was discharged on prednisone  tapering dose.  Advised to continue Trelegy Ellipta  inhaler and albuterol  inhaler prescribed.  She can also use albuterol  nebulizer at home. Follow-up with PCP and pulmonary as an outpatient.  # Chest pain most likely musculoskeletal,  ACS ruled out. Troponin  remained negative Lipid profile, triglyceride 218, LDL 93 and TSH 1.43 wnl - Continue aspirin , Imdur , propranolol , atorvastatin  - Recent echo done on November 3 revealed LVEF of 55 to 60% with mild LVH and grade 1 diastolic dysfunction 11/21 complaining of back pain which is chronic for past few months.  Requesting opiates oxycodone  was given for  as needed use   # HTN: Continue propranolol  and Imdur .  Monitor BP at home and follow with PCP.   # GERD: Stable, continue PPI # Anxiety/depression: -Continue buspirone  and sertraline  11/20 Atarax  25 mg 3 times daily x 2 doses given   # History of polycythemia vera: Continue hydroxyurea  - Follow up with hematology as outpatient   # History of Von Willebrands: s/p SCDs and early ambulation for DVT prophylaxis.    # Vitamin B12 level 367, goal >400.  Started vitamin B12 oral supplement.  Follow-up with PCP to repeat vitamin B12 level after 3 to 6 months.   Body mass index is 32.32 kg/m.  Nutrition Interventions:  Pain control  - [place]  Controlled Substance Reporting System database could not be reviewed as website was not working - Oxycodone  5 mg 10 pills given for as needed use for - Patient was instructed, not to drive, operate heavy machinery, perform activities at heights, swimming or participation in water activities or provide baby sitting services while on Pain, Sleep and Anxiety Medications; until her outpatient Physician has advised to do so again.  - Also recommended to not to take more than prescribed Pain, Sleep and Anxiety Medications.  Patient was ambulatory without any assistance.  On the day of the discharge the patient's vitals were stable, and no other acute medical condition were reported by patient. the patient was felt safe to be discharge at Home.  Consultants: None Procedures: None  Discharge Exam: General: Appear in no distress, Oral Mucosa Clear, moist. Cardiovascular: S1 and S2 Present, no Murmur, Respiratory: normal respiratory effort, Bilateral Air entry present and no Crackles, no wheezes Abdomen: Bowel Sound present, Soft and no tenderness. Extremities: no Pedal edema, no calf tenderness Neurology: alert and oriented to time, place, and person affect appropriate.  Filed Weights   09/14/24 1442  Weight: 72.6 kg   Vitals:   09/17/24  0427 09/17/24 0754  BP: 121/69 103/67  Pulse: 64 61  Resp: 17 20  Temp: (P) 97.7 F (36.5 C) 97.7 F (36.5 C)  SpO2: 100% 96%    DISCHARGE MEDICATION: Allergies as of 09/17/2024       Reactions   Aspirin     Able to tolerate low dose   Pregabalin Other (See Comments)   Unsteady gate, causing falls   Trazodone Other (See Comments)   weakness   Tramadol Rash        Medication List     STOP taking these medications    ARIPiprazole  2 MG tablet Commonly known as: ABILIFY    benzonatate  100 MG capsule Commonly known as: TESSALON    mirtazapine  15 MG tablet Commonly known as: REMERON        TAKE these medications    albuterol  (2.5 MG/3ML) 0.083% nebulizer solution Commonly known as: PROVENTIL  Take 3 mLs (2.5 mg total) by nebulization every 6 (six) hours as needed for wheezing or shortness of breath. What changed: Another medication with the same name was added. Make sure you understand how and when to take each.   albuterol  108 (90 Base) MCG/ACT inhaler Commonly known as: VENTOLIN  HFA Inhale 2 puffs into the lungs every 6 (six)  hours as needed for wheezing or shortness of breath. What changed: You were already taking a medication with the same name, and this prescription was added. Make sure you understand how and when to take each.   aspirin  EC 81 MG tablet Take 81 mg by mouth daily.   atorvastatin  20 MG tablet Commonly known as: LIPITOR Take 20 mg by mouth at bedtime.   busPIRone  7.5 MG tablet Commonly known as: BUSPAR  Take 7.5 mg by mouth 3 (three) times daily.   cetirizine 10 MG tablet Commonly known as: ZYRTEC Take 10 mg by mouth daily.   cyclobenzaprine  10 MG tablet Commonly known as: FLEXERIL  Take 10 mg by mouth at bedtime.   folic acid  1 MG tablet Commonly known as: FOLVITE  Take 1 mg by mouth daily.   FT Tussin DM Adult 20-200 MG/20ML Liqd Generic drug: Dextromethorphan -guaiFENesin  Take 20 mLs by mouth every 6 (six) hours as needed.    hydroxyurea  500 MG capsule Commonly known as: HYDREA  Take 500 mg by mouth 2 (two) times daily. May take with food to minimize GI side effects.   ipratropium-albuterol  0.5-2.5 (3) MG/3ML Soln Commonly known as: DUONEB Take 3 mLs by nebulization every 6 (six) hours for 7 days.   isosorbide  mononitrate 30 MG 24 hr tablet Commonly known as: IMDUR  Take 30 mg by mouth daily.   MULTIVIT/MULTIMINERAL ADULT PO Take 1 tablet by mouth daily.   NexIUM 40 MG capsule Generic drug: esomeprazole Take 40 mg by mouth daily.   oxyCODONE  5 MG immediate release tablet Commonly known as: Oxy IR/ROXICODONE  Take 1 tablet (5 mg total) by mouth 2 (two) times daily as needed for up to 5 days.   predniSONE  20 MG tablet Commonly known as: DELTASONE  Take 2 tablets (40 mg total) by mouth daily with breakfast for 1 day, THEN 1.5 tablets (30 mg total) daily with breakfast for 1 day, THEN 1 tablet (20 mg total) daily with breakfast for 1 day, THEN 0.5 tablets (10 mg total) daily with breakfast for 1 day. Start taking on: September 17, 2024   propranolol  40 MG tablet Commonly known as: INDERAL  Take 40 mg by mouth 2 (two) times daily.   sertraline  100 MG tablet Commonly known as: ZOLOFT  Take 100 mg by mouth daily. What changed: Another medication with the same name was removed. Continue taking this medication, and follow the directions you see here.   Trelegy Ellipta  200-62.5-25 MCG/ACT Aepb Generic drug: Fluticasone -Umeclidin-Vilant Inhale 1 puff into the lungs daily.   triamcinolone ointment 0.1 % Commonly known as: KENALOG Apply 1 Application topically 2 (two) times daily as needed.   vitamin B-12 500 MCG tablet Commonly known as: CYANOCOBALAMIN  Take 1 tablet (500 mcg total) by mouth daily. Start taking on: September 18, 2024       Allergies  Allergen Reactions   Aspirin      Able to tolerate low dose   Pregabalin Other (See Comments)    Unsteady gate, causing falls   Trazodone Other (See  Comments)    weakness   Tramadol Rash   Discharge Instructions     Call MD for:  difficulty breathing, headache or visual disturbances   Complete by: As directed    Call MD for:  extreme fatigue   Complete by: As directed    Call MD for:  persistant dizziness or light-headedness   Complete by: As directed    Call MD for:  persistant nausea and vomiting   Complete by: As directed    Call MD  for:  severe uncontrolled pain   Complete by: As directed    Call MD for:  temperature >100.4   Complete by: As directed    Diet - low sodium heart healthy   Complete by: As directed    Diet Carb Modified   Complete by: As directed    Discharge instructions   Complete by: As directed    Follow-up with PCP in 1 week Follow-up with pulmonary in 1 to 2 weeks for PFTs Repeat vitamin B12 level in between 3 to 6 months   Increase activity slowly   Complete by: As directed        The results of significant diagnostics from this hospitalization (including imaging, microbiology, ancillary and laboratory) are listed below for reference.    Significant Diagnostic Studies: DG Chest 2 View Result Date: 09/14/2024 EXAM: 2 VIEW(S) XRAY OF THE CHEST 09/14/2024 03:05:15 PM COMPARISON: Comparison 04/12/2024. CLINICAL HISTORY: CP. FINDINGS: LUNGS AND PLEURA: No focal pulmonary opacity. No pleural effusion. No pneumothorax. HEART AND MEDIASTINUM: No acute abnormality of the cardiac and mediastinal silhouettes. BONES AND SOFT TISSUES: No acute osseous abnormality. IMPRESSION: 1. No acute cardiopulmonary process. Electronically signed by: Lynwood Seip MD 09/14/2024 03:26 PM EST RP Workstation: [id number]   ECHOCARDIOGRAM COMPLETE Result Date: 08/30/2024    ECHOCARDIOGRAM REPORT   Patient Name:   Lauren Escobar Date of Exam: 08/30/2024 Medical Rec #:  [id number]         Height:       61.5 in Accession #:    [id number]        Weight:       163.0 lb Date of Birth:  1966-08-27        BSA:          1.742 m Patient  Age:    58 years          BP:           116/84 mmHg Patient Gender: F                 HR:           91 bpm. Exam Location:  [facility] Procedure: 2D Echo, Cardiac Doppler and Color Doppler (Both Spectral and Color            Flow Doppler were utilized during procedure). Indications:     Dyspnea R06.00                  centrilobular emphysema J43.2  History:         Patient has no prior history of Echocardiogram examinations.                  COPD; Risk Factors:Hypertension. Tobacco use.  Sonographer:     Christopher Furnace Referring Phys:  [id number] KHABIB DGAYLI Diagnosing Phys: Deatrice Cage MD  Sonographer Comments: Image acquisition challenging due to COPD. IMPRESSIONS  1. Left ventricular ejection fraction, by estimation, is 55 to 60%. The left ventricle has normal function. The left ventricle has no regional wall motion abnormalities. There is mild left ventricular hypertrophy. Left ventricular diastolic parameters are consistent with Grade I diastolic dysfunction (impaired relaxation).  2. Right ventricular systolic function is normal. The right ventricular size is normal. Tricuspid regurgitation signal is inadequate for assessing PA pressure.  3. The mitral valve is normal in structure. No evidence of mitral valve regurgitation. No evidence of mitral stenosis.  4. The aortic valve is normal in structure. Aortic valve regurgitation is not visualized. No  aortic stenosis is present.  5. The inferior vena cava is normal in size with greater than 50% respiratory variability, suggesting right atrial pressure of 3 mmHg. FINDINGS  Left Ventricle: Left ventricular ejection fraction, by estimation, is 55 to 60%. The left ventricle has normal function. The left ventricle has no regional wall motion abnormalities. The left ventricular internal cavity size was normal in size. There is  mild left ventricular hypertrophy. Left ventricular diastolic parameters are consistent with Grade I diastolic dysfunction (impaired relaxation). Right  Ventricle: The right ventricular size is normal. No increase in right ventricular wall thickness. Right ventricular systolic function is normal. Tricuspid regurgitation signal is inadequate for assessing PA pressure. Left Atrium: Left atrial size was normal in size. Right Atrium: Right atrial size was normal in size. Pericardium: There is no evidence of pericardial effusion. Mitral Valve: The mitral valve is normal in structure. No evidence of mitral valve regurgitation. No evidence of mitral valve stenosis. MV peak gradient, 5.1 mmHg. The mean mitral valve gradient is 2.0 mmHg. Tricuspid Valve: The tricuspid valve is normal in structure. Tricuspid valve regurgitation is not demonstrated. No evidence of tricuspid stenosis. Aortic Valve: The aortic valve is normal in structure. Aortic valve regurgitation is not visualized. No aortic stenosis is present. Aortic valve mean gradient measures 2.0 mmHg. Aortic valve peak gradient measures 2.4 mmHg. Aortic valve area, by VTI measures 3.34 cm. Pulmonic Valve: The pulmonic valve was normal in structure. Pulmonic valve regurgitation is not visualized. No evidence of pulmonic stenosis. Aorta: The aortic root is normal in size and structure. Venous: The inferior vena cava is normal in size with greater than 50% respiratory variability, suggesting right atrial pressure of 3 mmHg. IAS/Shunts: No atrial level shunt detected by color flow Doppler.  LEFT VENTRICLE PLAX 2D LVIDd:         3.57 cm   Diastology LVIDs:         2.46 cm   LV e' medial:    4.45 cm/s LV PW:         1.23 cm   LV E/e' medial:  12.1 LV IVS:        1.29 cm   LV e' lateral:   10.90 cm/s LVOT diam:     2.00 cm   LV E/e' lateral: 4.9 LV SV:         42 LV SV Index:   24 LVOT Area:     3.14 cm LV IVRT:       79 msec  RIGHT VENTRICLE RV Basal diam:  3.32 cm RV Mid diam:    3.58 cm RV S prime:     8.90 cm/s TAPSE (M-mode): 1.6 cm LEFT ATRIUM           Index        RIGHT ATRIUM           Index LA diam:      2.80 cm  1.61 cm/m   RA Area:     10.60 cm LA Vol (A2C): 22.4 ml 12.86 ml/m  RA Volume:   21.70 ml  12.46 ml/m LA Vol (A4C): 10.8 ml 6.20 ml/m  AORTIC VALVE AV Area (Vmax):    2.67 cm AV Area (Vmean):   2.34 cm AV Area (VTI):     3.34 cm AV Vmax:           77.50 cm/s AV Vmean:          61.200 cm/s AV VTI:  0.126 m AV Peak Grad:      2.4 mmHg AV Mean Grad:      2.0 mmHg LVOT Vmax:         65.80 cm/s LVOT Vmean:        45.600 cm/s LVOT VTI:          0.134 m LVOT/AV VTI ratio: 1.06  AORTA Ao Root diam: 2.80 cm MITRAL VALVE               TRICUSPID VALVE MV Area (PHT): 6.90 cm    TR Peak grad:   6.0 mmHg MV Area VTI:   2.29 cm    TR Vmax:        122.00 cm/s MV Peak grad:  5.1 mmHg MV Mean grad:  2.0 mmHg    SHUNTS MV Vmax:       1.13 m/s    Systemic VTI:  0.13 m MV Vmean:      71.5 cm/s   Systemic Diam: 2.00 cm MV Decel Time: 110 msec MV E velocity: 53.80 cm/s MV A velocity: 44.15 cm/s MV E/A ratio:  1.22 Deatrice Cage MD Electronically signed by Deatrice Cage MD Signature Date/Time: 08/30/2024/1:09:23 PM    Final     Microbiology: Recent Results (from the past 240 hours)  Resp panel by RT-PCR (RSV, Flu A&B, Covid) Anterior Nasal Swab     Status: None   Collection Time: 09/14/24  8:53 PM   Specimen: Anterior Nasal Swab  Result Value Ref Range Status   SARS Coronavirus 2 by RT PCR NEGATIVE NEGATIVE Final    Comment: (NOTE) SARS-CoV-2 target nucleic acids are NOT DETECTED.  The SARS-CoV-2 RNA is generally detectable in upper respiratory specimens during the acute phase of infection. The lowest concentration of SARS-CoV-2 viral copies this assay can detect is 138 copies/mL. A negative result does not preclude SARS-Cov-2 infection and should not be used as the sole basis for treatment or other patient management decisions. A negative result may occur with  improper specimen collection/handling, submission of specimen other than nasopharyngeal swab, presence of viral mutation(s) within  the areas targeted by this assay, and inadequate number of viral copies(<138 copies/mL). A negative result must be combined with clinical observations, patient history, and epidemiological information. The expected result is Negative.  Fact Sheet for Patients:  bloggercourse.com  Fact Sheet for Healthcare Providers:  seriousbroker.it  This test is no t yet approved or cleared by the [place]  FDA and  has been authorized for detection and/or diagnosis of SARS-CoV-2 by FDA under an Emergency Use Authorization (EUA). This EUA will remain  in effect (meaning this test can be used) for the duration of the COVID-19 declaration under Section 564(b)(1) of the Act, 21 U.S.C.section 360bbb-3(b)(1), unless the authorization is terminated  or revoked sooner.       Influenza A by PCR NEGATIVE NEGATIVE Final   Influenza B by PCR NEGATIVE NEGATIVE Final    Comment: (NOTE) The Xpert Xpress SARS-CoV-2/FLU/RSV plus assay is intended as an aid in the diagnosis of influenza from Nasopharyngeal swab specimens and should not be used as a sole basis for treatment. Nasal washings and aspirates are unacceptable for Xpert Xpress SARS-CoV-2/FLU/RSV testing.  Fact Sheet for Patients: bloggercourse.com  Fact Sheet for Healthcare Providers: seriousbroker.it  This test is not yet approved or cleared by the [place]  FDA and has been authorized for detection and/or diagnosis of SARS-CoV-2 by FDA under an Emergency Use Authorization (EUA). This EUA will remain in effect (meaning  this test can be used) for the duration of the COVID-19 declaration under Section 564(b)(1) of the Act, 21 U.S.C. section 360bbb-3(b)(1), unless the authorization is terminated or revoked.     Resp Syncytial Virus by PCR NEGATIVE NEGATIVE Final    Comment: (NOTE) Fact Sheet for  Patients: bloggercourse.com  Fact Sheet for Healthcare Providers: seriousbroker.it  This test is not yet approved or cleared by the [place]  FDA and has been authorized for detection and/or diagnosis of SARS-CoV-2 by FDA under an Emergency Use Authorization (EUA). This EUA will remain in effect (meaning this test can be used) for the duration of the COVID-19 declaration under Section 564(b)(1) of the Act, 21 U.S.C. section 360bbb-3(b)(1), unless the authorization is terminated or revoked.  Performed at [facility], [address] [place], [place] [address]      Labs: CBC: Recent Labs  Lab 09/14/24 1445 09/15/24 0537 09/16/24 0617 09/17/24 0417  WBC 8.9 7.6 11.6* 8.4  HGB 13.5 12.5 11.5* 11.9*  HCT 37.9 35.5* 34.1* 35.4*  MCV 102.7* 104.1* 107.9* 109.6*  PLT 278 267 251 224   Basic Metabolic Panel: Recent Labs  Lab 09/14/24 1445 09/15/24 0537 09/16/24 0617 09/17/24 0417  NA 137 137 140 141  K 3.7 4.3 3.9 3.6  CL 103 104 110 106  CO2 22 21* 24 24  GLUCOSE 103* 162* 104* 91  BUN 11 19 17 13   CREATININE 0.62 0.72 0.61 0.63  CALCIUM  9.4 9.2 8.5* 8.5*  MG  --  2.7* 2.1 1.9  PHOS  --  3.5 3.0 3.3   Liver Function Tests: No results for input(s): AST, ALT, ALKPHOS, BILITOT, PROT, ALBUMIN in the last 168 hours. No results for input(s): LIPASE, AMYLASE in the last 168 hours. No results for input(s): AMMONIA in the last 168 hours. Cardiac Enzymes: No results for input(s): CKTOTAL, CKMB, CKMBINDEX, TROPONINI in the last 168 hours. BNP (last 3 results) Recent Labs    04/12/24 0723  BNP 191.8*   CBG: Recent Labs  Lab 09/16/24 0810 09/16/24 1145 09/16/24 1617 09/16/24 2120 09/17/24 0755  GLUCAP 97 143* 159* 99 105*    Time spent: 35 minutes  Signed:  Elvan Sor  [facility] 09/17/2024  4:57 PM

## 2024-09-17 NOTE — Plan of Care (Signed)
  Problem: Clinical Measurements: Goal: Ability to maintain clinical measurements within normal limits will improve Outcome: Progressing Goal: Will remain free from infection Outcome: Progressing Goal: Diagnostic test results will improve Outcome: Progressing Goal: Respiratory complications will improve Outcome: Progressing Goal: Cardiovascular complication will be avoided Outcome: Progressing   Problem: Activity: Goal: Risk for activity intolerance will decrease Outcome: Progressing   Problem: Nutrition: Goal: Adequate nutrition will be maintained Outcome: Progressing   Problem: Coping: Goal: Level of anxiety will decrease Outcome: Progressing   Problem: Pain Managment: Goal: General experience of comfort will improve and/or be controlled Outcome: Progressing   Problem: Safety: Goal: Ability to remain free from injury will improve Outcome: Progressing   Problem: Skin Integrity: Goal: Risk for impaired skin integrity will decrease Outcome: Progressing   Problem: Skin Integrity: Goal: Risk for impaired skin integrity will decrease Outcome: Progressing   Problem: Tissue Perfusion: Goal: Adequacy of tissue perfusion will improve Outcome: Progressing

## 2024-09-17 NOTE — TOC CM/SW Note (Signed)
 Transition of Care [facility]) - Inpatient Brief Assessment   Patient Details  Name: LAKINA MCINTIRE MRN: [id number] Date of Birth: 1966-05-08  Transition of Care [facility]) CM/SW Contact:    Corean ONEIDA Haddock, RN Phone Number: 09/17/2024, 11:10 AM   Clinical Narrative:    Transition of Care [facility]) Screening Note   Patient Details  Name: HELAINE YACKEL Date of Birth: 08-04-1966   Transition of Care [facility]) CM/SW Contact:    Corean ONEIDA Haddock, RN Phone Number: 09/17/2024, 11:10 AM    Transition of Care Department [facility]) has reviewed patient and no TOC needs have been identified at this time.. If new patient transition needs arise, please place a TOC consult.   Transition of Care Asessment: Insurance and Status: Insurance coverage has been reviewed Patient has primary care physician: Yes     Prior/Current Home Services: No current home services Social Drivers of Health Review: SDOH reviewed no interventions necessary Readmission risk has been reviewed: Yes Transition of care needs: no transition of care needs at this time

## 2024-09-21 ENCOUNTER — Encounter

## 2024-09-21 DIAGNOSIS — J432 Centrilobular emphysema: Secondary | ICD-10-CM | POA: Diagnosis present

## 2024-09-21 DIAGNOSIS — R0602 Shortness of breath: Secondary | ICD-10-CM | POA: Diagnosis present

## 2024-09-21 NOTE — Progress Notes (Addendum)
 Daily Session Note  Patient Details  Name: Lauren Escobar MRN: [id number] Date of Birth: 03-31-1966 Referring Provider:   Flowsheet Row Pulmonary Rehab from 04/28/2024 in [facility] Cardiac and Pulmonary Rehab  Referring Provider [name] MD    Encounter Date: 09/21/2024  Check In:  Session Check In - 09/21/24 1058       Check-In   Supervising physician immediately available to respond to emergencies See telemetry face sheet for immediately available ER MD    Location [facility]-Cardiac & Pulmonary Rehab    Staff Present Burnard Davenport RN,BSN,MPA;Maxon Conetta BS, Exercise Physiologist;Noah Tickle, BS, Exercise Physiologist;Margaret Best, MS, Exercise Physiologist;Jason Elnor RDN,LDN    Virtual Visit No    Medication changes reported     Yes    Comments added prednisone  and B12    Fall or balance concerns reported    No    Tobacco Cessation No Change    Warm-up and Cool-down Performed on first and last piece of equipment    Resistance Training Performed Yes    VAD Patient? No    PAD/SET Patient? No      Pain Assessment   Currently in Pain? No/denies             Social History   Tobacco Use  Smoking Status Former   Current packs/day: 0.00   Average packs/day: 2.8 packs/day for 29.0 years (79.8 ttl pk-yrs)   Types: Cigarettes   Start date: 10/1991   Quit date: 10/2020   Years since quitting: 3.9  Smokeless Tobacco Never    Goals Met:  Proper associated with RPD/PD & O2 Sat Independence with exercise equipment Using PLB without cueing & demonstrates good technique Exercise tolerated well No report of concerns or symptoms today Strength training completed today  Goals Unmet:  Not Applicable  Comments: Pt able to follow exercise prescription today without complaint.  Will continue to monitor for progression.    Dr. Oneil Pinal is Medical Director for [facility] Cardiac Rehabilitation.  Dr. Fuad Aleskerov is Medical Director for [facility] Pulmonary  Rehabilitation.

## 2024-09-27 MED FILL — HYDROXYUREA 500 MG CAPSULE: ORAL | 30 days supply | Qty: 60 | Fill #1

## 2024-09-28 ENCOUNTER — Encounter

## 2024-09-28 DIAGNOSIS — J432 Centrilobular emphysema: Secondary | ICD-10-CM | POA: Diagnosis present

## 2024-09-28 NOTE — Progress Notes (Signed)
 Daily Session Note  Patient Details  Name: Lauren Escobar MRN: [id number] Date of Birth: 06/20/1966 Referring Provider:   Flowsheet Row Pulmonary Rehab from 04/28/2024 in [facility] Cardiac and Pulmonary Rehab  Referring Provider [name] MD    Encounter Date: 09/28/2024  Check In:  Session Check In - 09/28/24 1054       Check-In   Supervising physician immediately available to respond to emergencies See telemetry face sheet for immediately available ER MD    Location [facility]-Cardiac & Pulmonary Rehab    Staff Present Burnard Davenport RN,BSN,MPA;Maxon Conetta BS, Exercise Physiologist;Margaret Best, MS, Exercise Physiologist;Noah Tickle, BS, Exercise Physiologist;Jason Elnor RDN,LDN    Virtual Visit No    Medication changes reported     No    Fall or balance concerns reported    No    Tobacco Cessation No Change    Warm-up and Cool-down Performed on first and last piece of equipment    Resistance Training Performed Yes    VAD Patient? No    PAD/SET Patient? No      Pain Assessment   Currently in Pain? No/denies             Social History   Tobacco Use  Smoking Status Former   Current packs/day: 0.00   Average packs/day: 2.8 packs/day for 29.0 years (79.8 ttl pk-yrs)   Types: Cigarettes   Start date: 10/1991   Quit date: 10/2020   Years since quitting: 3.9  Smokeless Tobacco Never    Goals Met:  Proper associated with RPD/PD & O2 Sat Independence with exercise equipment Using PLB without cueing & demonstrates good technique Exercise tolerated well No report of concerns or symptoms today Strength training completed today  Goals Unmet:  Not Applicable  Comments: Pt able to follow exercise prescription today without complaint.  Will continue to monitor for progression.    Dr. Oneil Pinal is Medical Director for [facility] Cardiac Rehabilitation.  Dr. Fuad Aleskerov is Medical Director for [facility] Pulmonary Rehabilitation.

## 2024-09-29 ENCOUNTER — Encounter: Payer: Self-pay | Admitting: Student in an Organized Health Care Education/Training Program

## 2024-09-29 ENCOUNTER — Ambulatory Visit: Admitting: Student in an Organized Health Care Education/Training Program

## 2024-09-29 VITALS — BP 124/74 | HR 107 | Temp 97.6°F | Ht 59.0 in | Wt 157.8 lb

## 2024-09-29 DIAGNOSIS — R079 Chest pain, unspecified: Secondary | ICD-10-CM

## 2024-09-29 DIAGNOSIS — J42 Unspecified chronic bronchitis: Secondary | ICD-10-CM

## 2024-09-29 DIAGNOSIS — Z87891 Personal history of nicotine dependence: Secondary | ICD-10-CM

## 2024-09-29 DIAGNOSIS — J432 Centrilobular emphysema: Secondary | ICD-10-CM

## 2024-09-29 DIAGNOSIS — R911 Solitary pulmonary nodule: Secondary | ICD-10-CM

## 2024-09-29 DIAGNOSIS — F17200 Nicotine dependence, unspecified, uncomplicated: Secondary | ICD-10-CM

## 2024-09-29 MED ORDER — ENSIFENTRINE 3 MG/2.5ML IN SUSP: 3.0000 mg | Freq: Two times a day (BID) | RESPIRATORY_TRACT | Status: AC

## 2024-09-29 NOTE — Addendum Note (Signed)
 Addended by: VICCI EVALENE DEL on: 09/29/2024 11:57 AM   Modules accepted: Orders

## 2024-09-29 NOTE — Patient Instructions (Addendum)
  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 We reviewed your current treatments and made some adjustments to better manage your symptoms.  YOUR PLAN: -CHRONIC OBSTRUCTIVE PULMONARY DISEASE WITH CHRONIC BRONCHITIS: COPD with chronic bronchitis is a long-term lung condition that causes breathing difficulties and persistent cough. You should continue taking Trelegy daily, and we have added encephentron nebulizer treatment twice daily. Please complete a pulmonary function test in 10 days and continue with your pulmonary rehabilitation.  -CHEST PAIN: Your chest pain, which worsens with breathing and certain positions, is being evaluated for both cardiac and pulmonary causes. An echocardiogram was reassuring, but due to your smoking history and age, you have been referred to a cardiologist for further evaluation.  -RIGHT LUNG PULMONARY NODULE: A small 5 mm nodule was found in your right lung during a CT scan in October. This nodule is being monitored, and you are scheduled for a follow-up CT scan in April.  INSTRUCTIONS: Please complete the pulmonary function test in 10 days and continue with your pulmonary rehabilitation. Follow up with the cardiologist for further evaluation of your chest pain. Your next CT scan to monitor the lung nodule is scheduled for April.                      Contains text generated by Abridge.                                 Contains text generated by Abridge.

## 2024-09-29 NOTE — Progress Notes (Signed)
 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 Presents for follow up, last PFT's 06/2023 with FEV1 of 1.5L (68% predicted) and ratio of 0.86 consistent with PRiSM . Imaging showed emphysema. She has a repeat PFT coming up. Peak eosinophil count was 800 in 2014 and COPD/Asthma overlap is on the differential.  She is treated with triple therapy (ICS/LABA/LAMA) with Trelegy 200 which we will continue. She has enrolled in pulmonary rehab and has attended sessions which have been helpful. Her echocardiogram was re-assuring, and chest CT for lung cancer screening showed a small nodule that will need to be followed up in 6 months. PFT's are pending and scheduled for next week.  Her current treatment with Trelegy is effective and will be continued. I will add Ohtuvayre  (Ensifentrine ) to her treatment regimen.  - Continue Trelegy daily. - Added Ensifentrine  nebulizer treatment twice daily. - Complete pulmonary function test - Continue pulmonary rehabilitation. - Ensifentrine  3 MG/2.5ML SUSP; Inhale 3 mg into the lungs in the morning and at bedtime.  #Chest pain  Chest pain associated with breathing and positional changes. Echocardiogram was reassuring. Referral to cardiology for further CAD evaluation due to smoking history and age.  - Ambulatory referral to Cardiology  #Left lung pulmonary nodule #History of Tobacco Use  History of smoking, quit 2022. Enrolled in our lung cancer screening program that identified a small 5 mm nodule on CT scan in October. Surveillance recommended and repeat is ordered in April.  - Scheduled follow-up CT scan in April. - Continue lung cancer screening    Return in about 5 months (around 02/27/2025).  Belva November, MD [facility]  I spent 32 minutes caring for this patient today, including  preparing to see the patient, obtaining a medical history , reviewing a separately obtained history, performing a medically appropriate examination and/or evaluation, counseling and educating the patient/family/caregiver, ordering medications, tests, or procedures, documenting clinical information in the electronic health record, and independently interpreting results (not separately reported/billed) and communicating results to the patient/family/caregiver  End of visit medications:  Meds ordered this encounter  Medications   Ensifentrine  3 MG/2.5ML SUSP    Sig: Inhale 3 mg into the lungs in the morning and at bedtime.     Current Outpatient Medications:    albuterol  (PROVENTIL ) (2.5 MG/3ML) 0.083% nebulizer solution, Take 3 mLs (2.5 mg total) by nebulization every 6 (six) hours as needed for wheezing or shortness of breath., Disp: 75 mL, Rfl: 12   albuterol  (VENTOLIN  HFA) 108 (90 Base) MCG/ACT inhaler, Inhale 2 puffs into the lungs every 6 (six) hours as needed for wheezing or shortness of breath., Disp: 18 g, Rfl: 2   aspirin  EC 81 MG tablet, Take 81 mg by mouth daily., Disp: , Rfl:    atorvastatin  (LIPITOR) 20 MG tablet, Take 20 mg by mouth at bedtime., Disp: , Rfl:    busPIRone  (BUSPAR ) 7.5 MG tablet, Take 7.5 mg by mouth 3 (three) times daily., Disp: , Rfl:    cetirizine (ZYRTEC) 10 MG tablet, Take 10 mg by mouth daily., Disp: , Rfl:    cyanocobalamin  (VITAMIN B12) 500 MCG tablet, Take 1 tablet (500 mcg total) by mouth daily., Disp: 30 tablet, Rfl: 2   cyclobenzaprine  (FLEXERIL ) 10 MG tablet, Take 10 mg by mouth at bedtime., Disp: , Rfl:    Ensifentrine  3 MG/2.5ML SUSP, Inhale 3 mg into the lungs in the  morning and at bedtime., Disp: , Rfl:    esomeprazole (NEXIUM) 40 MG capsule, Take 40 mg by mouth daily. , Disp: , Rfl:    Fluticasone -Umeclidin-Vilant (TRELEGY ELLIPTA ) 200-62.5-25 MCG/ACT AEPB, Inhale 1 puff into the lungs daily., Disp: 60 each, Rfl: 11   folic acid  (FOLVITE ) 1 MG  tablet, Take 1 mg by mouth daily., Disp: , Rfl:    hydroxyurea  (HYDREA ) 500 MG capsule, Take 500 mg by mouth 2 (two) times daily. May take with food to minimize GI side effects., Disp: , Rfl:    ipratropium-albuterol  (DUONEB) 0.5-2.5 (3) MG/3ML SOLN, Take 3 mLs by nebulization every 6 (six) hours for 7 days., Disp: 90 mL, Rfl: 0   Multiple Vitamins-Minerals (MULTIVIT/MULTIMINERAL ADULT PO), Take 1 tablet by mouth daily., Disp: , Rfl:    sertraline  (ZOLOFT ) 100 MG tablet, Take 100 mg by mouth daily., Disp: , Rfl:    triamcinolone ointment (KENALOG) 0.1 %, Apply 1 Application topically 2 (two) times daily as needed., Disp: , Rfl:    Dextromethorphan -guaiFENesin  20-200 MG/20ML LIQD, Take 20 mLs by mouth every 6 (six) hours as needed. (Patient not taking: Reported on 09/29/2024), Disp: 236 mL, Rfl: 0   isosorbide  mononitrate (IMDUR ) 30 MG 24 hr tablet, Take 30 mg by mouth daily. (Patient not taking: Reported on 09/29/2024), Disp: , Rfl:    oxyCODONE  (OXY IR/ROXICODONE ) 5 MG immediate release tablet, Take 1 tablet (5 mg total) by mouth 2 (two) times daily as needed for up to 5 days. (Patient not taking: Reported on 09/29/2024), Disp: 10 tablet, Rfl: 0   propranolol  (INDERAL ) 40 MG tablet, Take 40 mg by mouth 2 (two) times daily. (Patient not taking: Reported on 09/29/2024), Disp: , Rfl:    Subjective:   PATIENT ID: Lauren Escobar: female DOB: [birth year], MRN: [id number]  Chief Complaint  Patient presents with   Medical Management of Chronic Issues    Shortness of breath on exertion. Dry cough. C/O back and chest pain, with breathing.     HPI  Discussed the use of AI scribe software for clinical note transcription with the patient, who gave verbal consent to proceed.  History of Present Illness Lauren Escobar is a 58 year old female who presents for follow-up of COPD.  Return Visit 04/08/2024:   She's had a recent COPD exacerbation since our last visit, treated with antibiotics and  steroids. She was admitted between 01/07/2024 and 01/10/2024 for said COPD exacerbation. She continues to experience residual dyspnea as well as cough. She was confused with her inhaler therapy, and has only used the Spiriva  and has not used her Breo Ellipta  recently. She is also reporting feeling tired in the morning, multiple nocturnal awakenings, and snoring. She is concerned for sleep apnea.   Prior to this, she's been followed with us  in clinic for symptoms of exertional dyspnea and cough for which we prescribed Breo Ellipta  and Spiriva . The prior exacerbation was in July of 2024.   She's not on any new medications, and denies any alcohol or illicit drug use. She has no fevers, no stiffness, and no flushing. There has been no change to symptoms otherwise.   She denies any fevers, chills, hemoptysis, productive sputum, night sweats, weight loss, abdominal pain, chest pain, or chest tightness.  She has gained weight over the past couple of years (between 15 and 20 pounds) and this has coincided with worsening symptoms.  Her other providers include hematology at [facility] where she is managed for polycythemia vera.  She  was getting yearly CT scans for lung cancer screening at [facility].  Review of the record does note that the CT mentions emphysema but otherwise normal lung parenchyma.   Return Visit 07/26/2024:    She continues to experience respiratory symptoms, with shortness of breath most notable. She reports a minimal wheeze and cough. She did have a sore throat last week that has since resolved. She is compliant with her trelegy and is using her albuterol  and nebulizers.She was unable to get her TTE nor her low dose CT.  Return Visit 09/29/2024:  She experiences persistent chest and back pain, which is exacerbated by breathing and lying down. She also has significant shortness of breath during exercise and activity. Her breathing pattern is variable, described as 'up and down'.  In October, a CT scan  revealed a very small 5 mm nodule. An ultrasound of her heart was performed. She was hospitalized from November 18 to November 20 for chest pain and breathing issues, during which she was treated for a COPD exacerbation. She was discharged on these medications and continues to use Trelegy along with all her inhalers, which she reports help her condition.  She has a history of using a lidocaine  patch for pain management, but it did not provide relief. She has been living with pain for years and has been referred to a pain clinic, but she declined treatment due to her blood disorder.  She is currently on Trelegy once a day and is undergoing pulmonary rehabilitation, which she finds somewhat helpful. She is also trying to avoid the need for oxygen therapy.   She used to work at [employer] but has not done so for many years and she is currently on disability.  She used to smoke (3 packs a day for at least 20 years) but quit 2023 after her friend came down with cancer.   Ancillary information including prior medications, full medical/surgical/family/social histories, and PFTs (when available) are listed below and have been reviewed.   Review of Systems  Constitutional:  Negative for chills, fever and weight loss.  Respiratory:  Positive for cough and shortness of breath. Negative for hemoptysis, sputum production and wheezing.   Cardiovascular:  Positive for chest pain.     Objective:   Vitals:   09/29/24 1108  BP: 124/74  Pulse: (!) 107  Temp: 97.6 F (36.4 C)  TempSrc: Temporal  SpO2: 100%  Weight: 157 lb 12.8 oz (71.6 kg)  Height: 4' 11 (1.499 m)   100% on RA  BMI Readings from Last 3 Encounters:  09/29/24 31.87 kg/m  09/14/24 32.32 kg/m  09/02/24 29.72 kg/m   Wt Readings from Last 3 Encounters:  09/29/24 157 lb 12.8 oz (71.6 kg)  09/14/24 160 lb (72.6 kg)  09/02/24 159 lb 14.4 oz (72.5 kg)     Physical Exam Constitutional:      Appearance: Normal appearance.   Cardiovascular:     Rate and Rhythm: Normal rate and regular rhythm.     Pulses: Normal pulses.     Heart sounds: Normal heart sounds.  Pulmonary:     Effort: Pulmonary effort is normal.     Breath sounds: No wheezing or rales.  Neurological:     General: No focal deficit present.     Mental Status: She is alert and oriented to person, place, and time. Mental status is at baseline.       Ancillary Information    Past Medical History:  Diagnosis Date   Abdominal pain  Abnormal mammogram    Anxiety    Asthma    Bleeding disorder    Blood dyscrasia    polycythemia vera   Chronic kidney disease    COPD (chronic obstructive pulmonary disease) (HCC)    Cough    Depression    Diabetes mellitus without complication (HCC)    Fatigue    Headache 2000   Horizontal nystagmus    age 22   Hyperglycemia    Hypertension    Low back pain    Major depressive disorder    Nasal lesion    Neck pain    Snoring    SOB (shortness of breath)    Tobacco use    UTI (urinary tract infection)    Von Willebrand's disease (HCC)      Family History  Problem Relation Age of Onset   Cancer Father        prostate   Stroke Father    Diabetes Father    Cancer Mother        bladder   Breast cancer Maternal Grandmother      Past Surgical History:  Procedure Laterality Date   ABDOMINAL HYSTERECTOMY  2002   oophorectomy   ABDOMINAL HYSTERECTOMY     APPENDECTOMY     BLADDER SURGERY     BREAST BIOPSY Left    NEG   CARDIAC CATHETERIZATION     CARDIAC CATHETERIZATION N/A 07/17/2016   Procedure: Left Heart Cath and Coronary Angiography;  Surgeon: Wolm JINNY Rhyme, MD;  Location: [facility] INVASIVE CV LAB;  Service: Cardiovascular;  Laterality: N/A;   COLONOSCOPY WITH PROPOFOL  N/A 07/27/2015   Procedure: COLONOSCOPY WITH PROPOFOL ;  Surgeon: Lamar ONEIDA Holmes, MD;  Location: [facility] ENDOSCOPY;  Service: Endoscopy;  Laterality: N/A;   COLONOSCOPY WITH PROPOFOL  N/A 05/31/2019   Procedure: COLONOSCOPY  WITH PROPOFOL ;  Surgeon: Unk Corinn Skiff, MD;  Location: [facility] ENDOSCOPY;  Service: Gastroenterology;  Laterality: N/A;   ESOPHAGOGASTRODUODENOSCOPY N/A 06/18/2017   Procedure: ESOPHAGOGASTRODUODENOSCOPY (EGD);  Surgeon: Unk Corinn Skiff, MD;  Location: [facility] ENDOSCOPY;  Service: Gastroenterology;  Laterality: N/A;   ESOPHAGOGASTRODUODENOSCOPY (EGD) WITH PROPOFOL  N/A 05/31/2019   Procedure: ESOPHAGOGASTRODUODENOSCOPY (EGD) WITH PROPOFOL ;  Surgeon: Unk Corinn Skiff, MD;  Location: [facility] ENDOSCOPY;  Service: Gastroenterology;  Laterality: N/A;   REPLACEMENT TOTAL KNEE Left    SHOULDER SURGERY Left    x 2    Social History   Socioeconomic History   Marital status: Single    Spouse name: Not on file   Number of children: Not on file   Years of education: Not on file   Highest education level: Not on file  Occupational History    Comment: disabled  Tobacco Use   Smoking status: Former    Current packs/day: 0.00    Average packs/day: 2.8 packs/day for 29.0 years (79.8 ttl pk-yrs)    Types: Cigarettes    Start date: 10/1991    Quit date: 10/2020    Years since quitting: 3.9   Smokeless tobacco: Never  Vaping Use   Vaping status: Never Used  Substance and Sexual Activity   Alcohol use: No    Alcohol/week: 0.0 standard drinks of alcohol   Drug use: No   Sexual activity: Not on file  Other Topics Concern   Not on file  Social History Narrative   single   Social Drivers of Health   Financial Resource Strain: Not on file  Food Insecurity: No Food Insecurity (09/14/2024)   Hunger Vital Sign    Worried  About Running Out of Food in the Last Year: Never true    Ran Out of Food in the Last Year: Never true  Transportation Needs: No Transportation Needs (09/14/2024)   PRAPARE - Administrator, Civil Service (Medical): No    Lack of Transportation (Non-Medical): No  Physical Activity: Not on file  Stress: Not on file  Social Connections: Moderately Isolated  (04/13/2024)   Social Connection and Isolation Panel    Frequency of Communication with Friends and Family: Twice a week    Frequency of Social Gatherings with Friends and Family: Never    Attends Religious Services: 1 to 4 times per year    Active Member of Clubs or Organizations: No    Attends Banker Meetings: Never    Marital Status: Married  Catering Manager Violence: Not At Risk (09/14/2024)   Humiliation, Afraid, Rape, and Kick questionnaire    Fear of Current or Ex-Partner: No    Emotionally Abused: No    Physically Abused: No    Sexually Abused: No     Allergies  Allergen Reactions   Aspirin      Able to tolerate low dose   Pregabalin Other (See Comments)    Unsteady gate, causing falls   Trazodone Other (See Comments)    weakness   Tramadol Rash     CBC    Component Value Date/Time   WBC 8.4 09/17/2024 0417   RBC 3.23 (L) 09/17/2024 0417   HGB 11.9 (L) 09/17/2024 0417   HGB 11.5 (L) 02/22/2015 0851   HCT 35.4 (L) 09/17/2024 0417   HCT 35.9 02/22/2015 0851   PLT 224 09/17/2024 0417   PLT 454 (H) 02/22/2015 0851   MCV 109.6 (H) 09/17/2024 0417   MCV 63 (L) 02/22/2015 0851   MCH 36.8 (H) 09/17/2024 0417   MCHC 33.6 09/17/2024 0417   RDW 13.1 09/17/2024 0417   RDW 18.6 (H) 02/22/2015 0851   LYMPHSABS 0.6 (L) 10/15/2022 0531   LYMPHSABS 2.0 02/22/2015 0851   MONOABS 0.6 10/15/2022 0531   MONOABS 0.4 02/22/2015 0851   EOSABS 0.1 10/15/2022 0531   EOSABS 0.4 02/22/2015 0851   BASOSABS 0.1 10/15/2022 0531   BASOSABS 0.1 02/22/2015 0851    Pulmonary Functions Testing Results:    Latest Ref Rng & Units 07/25/2022    2:02 PM  PFT Results  FVC-Pre L 1.58   FVC-Predicted Pre % 56   FVC-Post L 1.83   FVC-Predicted Post % 64   Pre FEV1/FVC % % 86   Post FEV1/FCV % % 82   FEV1-Pre L 1.35   FEV1-Predicted Pre % 61   FEV1-Post L 1.50   DLCO uncorrected ml/min/mmHg 14.12   DLCO UNC% % 81   DLVA Predicted % 93   TLC L 4.81   TLC % Predicted %  111   RV % Predicted % 142     Outpatient Medications Prior to Visit  Medication Sig Dispense Refill   albuterol  (PROVENTIL ) (2.5 MG/3ML) 0.083% nebulizer solution Take 3 mLs (2.5 mg total) by nebulization every 6 (six) hours as needed for wheezing or shortness of breath. 75 mL 12   albuterol  (VENTOLIN  HFA) 108 (90 Base) MCG/ACT inhaler Inhale 2 puffs into the lungs every 6 (six) hours as needed for wheezing or shortness of breath. 18 g 2   aspirin  EC 81 MG tablet Take 81 mg by mouth daily.     atorvastatin  (LIPITOR) 20 MG tablet Take 20 mg by mouth at  bedtime.     busPIRone  (BUSPAR ) 7.5 MG tablet Take 7.5 mg by mouth 3 (three) times daily.     cetirizine (ZYRTEC) 10 MG tablet Take 10 mg by mouth daily.     cyanocobalamin  (VITAMIN B12) 500 MCG tablet Take 1 tablet (500 mcg total) by mouth daily. 30 tablet 2   cyclobenzaprine  (FLEXERIL ) 10 MG tablet Take 10 mg by mouth at bedtime.     esomeprazole (NEXIUM) 40 MG capsule Take 40 mg by mouth daily.      Fluticasone -Umeclidin-Vilant (TRELEGY ELLIPTA ) 200-62.5-25 MCG/ACT AEPB Inhale 1 puff into the lungs daily. 60 each 11   folic acid  (FOLVITE ) 1 MG tablet Take 1 mg by mouth daily.     hydroxyurea  (HYDREA ) 500 MG capsule Take 500 mg by mouth 2 (two) times daily. May take with food to minimize GI side effects.     ipratropium-albuterol  (DUONEB) 0.5-2.5 (3) MG/3ML SOLN Take 3 mLs by nebulization every 6 (six) hours for 7 days. 90 mL 0   Multiple Vitamins-Minerals (MULTIVIT/MULTIMINERAL ADULT PO) Take 1 tablet by mouth daily.     sertraline  (ZOLOFT ) 100 MG tablet Take 100 mg by mouth daily.     triamcinolone ointment (KENALOG) 0.1 % Apply 1 Application topically 2 (two) times daily as needed.     Dextromethorphan -guaiFENesin  20-200 MG/20ML LIQD Take 20 mLs by mouth every 6 (six) hours as needed. (Patient not taking: Reported on 09/29/2024) 236 mL 0   isosorbide  mononitrate (IMDUR ) 30 MG 24 hr tablet Take 30 mg by mouth daily. (Patient not taking:  Reported on 09/29/2024)     oxyCODONE  (OXY IR/ROXICODONE ) 5 MG immediate release tablet Take 1 tablet (5 mg total) by mouth 2 (two) times daily as needed for up to 5 days. (Patient not taking: Reported on 09/29/2024) 10 tablet 0   propranolol  (INDERAL ) 40 MG tablet Take 40 mg by mouth 2 (two) times daily. (Patient not taking: Reported on 09/29/2024)     No facility-administered medications prior to visit.

## 2024-09-30 ENCOUNTER — Encounter: Admitting: Emergency Medicine

## 2024-09-30 DIAGNOSIS — J432 Centrilobular emphysema: Secondary | ICD-10-CM | POA: Diagnosis not present

## 2024-09-30 NOTE — Progress Notes (Signed)
 Daily Session Note  Patient Details  Name: Lauren Escobar MRN: [id number] Date of Birth: [birth year] Referring Provider:   Flowsheet Row Pulmonary Rehab from 04/28/2024 in [facility]  Referring Provider [name] MD    Encounter Date: 09/30/2024  Check In:  Session Check In - 09/30/24 1152       Check-In   Supervising physician immediately available to respond to emergencies See telemetry face sheet for immediately available ER MD    Location [facility]-Cardiac & Pulmonary Rehab    Staff Present Fairy Plater RCP,RRT,BSRT;Laureen Delores, BS, RRT, CPFT;Jason Elnor RDN,LDN;Lillith Mcneff RN,BSN    Virtual Visit No    Medication changes reported     No    Fall or balance concerns reported    No    Tobacco Cessation No Change    Warm-up and Cool-down Performed on first and last piece of equipment    Resistance Training Performed Yes    VAD Patient? No    PAD/SET Patient? No      Pain Assessment   Currently in Pain? No/denies             Social History   Tobacco Use  Smoking Status Former   Current packs/day: 0.00   Average packs/day: 2.8 packs/day for 29.0 years (79.8 ttl pk-yrs)   Types: Cigarettes   Start date: 10/1991   Quit date: 10/2020   Years since quitting: 3.9  Smokeless Tobacco Never    Goals Met:  Proper associated with RPD/PD & O2 Sat Independence with exercise equipment Using PLB without cueing & demonstrates good technique Exercise tolerated well No report of concerns or symptoms today Strength training completed today  Goals Unmet:  Not Applicable  Comments: Pt able to follow exercise prescription today without complaint.  Will continue to monitor for progression.    Dr. Oneil Pinal is Medical Director for [facility] Cardiac Rehabilitation.  Dr. Fuad Aleskerov is Medical Director for [facility] Pulmonary Rehabilitation.

## 2024-10-01 ENCOUNTER — Ambulatory Visit: Attending: Cardiology | Admitting: Cardiology

## 2024-10-01 ENCOUNTER — Encounter: Payer: Self-pay | Admitting: Cardiology

## 2024-10-01 VITALS — BP 90/62 | HR 91 | Ht 59.0 in | Wt 159.0 lb

## 2024-10-01 DIAGNOSIS — R079 Chest pain, unspecified: Secondary | ICD-10-CM

## 2024-10-01 DIAGNOSIS — E7800 Pure hypercholesterolemia, unspecified: Secondary | ICD-10-CM | POA: Diagnosis not present

## 2024-10-01 NOTE — Patient Instructions (Signed)
 Medication Instructions:  Your physician recommends that you continue on your current medications as directed. Please refer to the Current Medication list given to you today.   *If you need a refill on your cardiac medications before your next appointment, please call your pharmacy*  Lab Work: No labs ordered today  If you have labs (blood work) drawn today and your tests are completely normal, you will receive your results only by: MyChart Message (if you have MyChart) OR A paper copy in the mail If you have any lab test that is abnormal or we need to change your treatment, we will call you to review the results.  Testing/Procedures: Your provider has ordered a Lexiscan/ Exercise Myoview Stress test. This will take place at [facility]. Please report to the [facility] medical mall entrance. The volunteers at the first desk will direct you where to go.  [facility] MYOVIEW  Your provider has ordered a Stress Test with nuclear imaging. The purpose of this test is to evaluate the blood supply to your heart muscle. This procedure is referred to as a Non-Invasive Stress Test. This is because other than having an IV started in your vein, nothing is inserted or invades your body. Cardiac stress tests are done to find areas of poor blood flow to the heart by determining the extent of coronary artery disease (CAD). Some patients exercise on a treadmill, which naturally increases the blood flow to your heart, while others who are unable to walk on a treadmill due to physical limitations will have a pharmacologic/chemical stress agent called Lexiscan . This medicine will mimic walking on a treadmill by temporarily increasing your coronary blood flow.   Please note: these test may take anywhere between 2-4 hours to complete  How to prepare for your Myoview test:  Nothing to eat for 6 hours prior to the test No caffeine for 24 hours prior to test No smoking 24 hours prior to test. Your medication may be taken with  water.  If your doctor stopped a medication because of this test, do not take that medication. Ladies, please do not wear dresses.  Skirts or pants are appropriate. Please wear a short sleeve shirt. No perfume, cologne or lotion. Wear comfortable walking shoes. No heels!   PLEASE NOTIFY THE OFFICE AT LEAST 24 HOURS IN ADVANCE IF YOU ARE UNABLE TO KEEP YOUR APPOINTMENT.  [phone] AND  PLEASE NOTIFY NUCLEAR MEDICINE AT [facility] AT LEAST 24 HOURS IN ADVANCE IF YOU ARE UNABLE TO KEEP YOUR APPOINTMENT. [phone]   Follow-Up: At [facility], you and your health needs are our priority.  As part of our continuing mission to provide you with exceptional heart care, our providers are all part of one team.  This team includes your primary Cardiologist (physician) and Advanced Practice Providers or APPs (Physician Assistants and Nurse Practitioners) who all work together to provide you with the care you need, when you need it.  Your next appointment:   3 month(s)  Provider:   You may see Dr Darliss or one of the following Advanced Practice Providers on your designated Care Team:   Lonni Meager, NP Lesley Maffucci, PA-C Bernardino Bring, PA-C Cadence Langdon, PA-C Tylene Lunch, NP Barnie Hila, NP    We recommend signing up for the patient portal called MyChart.  Sign up information is provided on this After Visit Summary.  MyChart is used to connect with patients for Virtual Visits (Telemedicine).  Patients are able to view lab/test results, encounter notes, upcoming appointments,  etc.  Non-urgent messages can be sent to your provider as well.   To learn more about what you can do with MyChart, go to [email or web address].

## 2024-10-01 NOTE — Progress Notes (Signed)
 Cardiology Office Note:    Date:  10/01/2024   ID:  Lauren Escobar, DOB [birth year], MRN [id number]  PCP:  Kandis Stefano Iles, MD   [facility] Providers Cardiologist:  None     Referring MD: [name], MD   Chief Complaint  Patient presents with   New Patient (Initial Visit)    New  pt has complaints of chest pain, chest pressure or SOB, X 1 month ago medication reviewed verbally with patient    History of Present Illness:    Lauren Escobar is a 58 y.o. female with a hx of hyperlipidemia, former smoker x 20+ years, COPD presenting with chest pain.  Endorses chest pain over the past month or so.  Was previously on Imdur  and propranolol  but this was stopped due to low BP.  Symptoms of chest discomfort associated with movements.  Pushing on her chest causes discomfort.  Has a history of reflux, takes Nexium for this.  Current chest discomfort is different from her typical reflux symptoms.  Sister had a heart attack in her 63s.  Echocardiogram 08/2024 EF 55 to 60%, impaired laxation. Left heart cath 06/2016 no CAD.  Past Medical History:  Diagnosis Date   Abdominal pain    Abnormal mammogram    Anxiety    Asthma    Bleeding disorder    Blood dyscrasia    polycythemia vera   Chronic kidney disease    COPD (chronic obstructive pulmonary disease) (HCC)    Cough    Depression    Diabetes mellitus without complication (HCC)    Fatigue    Headache 2000   Horizontal nystagmus    age 60   Hyperglycemia    Hypertension    Low back pain    Major depressive disorder    Nasal lesion    Neck pain    Snoring    SOB (shortness of breath)    Tobacco use    UTI (urinary tract infection)    Von Willebrand's disease (HCC)     Past Surgical History:  Procedure Laterality Date   ABDOMINAL HYSTERECTOMY  2002   oophorectomy   ABDOMINAL HYSTERECTOMY     APPENDECTOMY     BLADDER SURGERY     BREAST BIOPSY Left    NEG   CARDIAC CATHETERIZATION     CARDIAC  CATHETERIZATION N/A 07/17/2016   Procedure: Left Heart Cath and Coronary Angiography;  Surgeon: Wolm JINNY Rhyme, MD;  Location: [facility] INVASIVE CV LAB;  Service: Cardiovascular;  Laterality: N/A;   COLONOSCOPY WITH PROPOFOL  N/A 07/27/2015   Procedure: COLONOSCOPY WITH PROPOFOL ;  Surgeon: Lamar ONEIDA Holmes, MD;  Location: [facility] ENDOSCOPY;  Service: Endoscopy;  Laterality: N/A;   COLONOSCOPY WITH PROPOFOL  N/A 05/31/2019   Procedure: COLONOSCOPY WITH PROPOFOL ;  Surgeon: Unk Corinn Skiff, MD;  Location: [facility] ENDOSCOPY;  Service: Gastroenterology;  Laterality: N/A;   ESOPHAGOGASTRODUODENOSCOPY N/A 06/18/2017   Procedure: ESOPHAGOGASTRODUODENOSCOPY (EGD);  Surgeon: Unk Corinn Skiff, MD;  Location: [facility] ENDOSCOPY;  Service: Gastroenterology;  Laterality: N/A;   ESOPHAGOGASTRODUODENOSCOPY (EGD) WITH PROPOFOL  N/A 05/31/2019   Procedure: ESOPHAGOGASTRODUODENOSCOPY (EGD) WITH PROPOFOL ;  Surgeon: Unk Corinn Skiff, MD;  Location: [facility] ENDOSCOPY;  Service: Gastroenterology;  Laterality: N/A;   REPLACEMENT TOTAL KNEE Left    SHOULDER SURGERY Left    x 2    Current Medications: Current Meds  Medication Sig   albuterol  (PROVENTIL ) (2.5 MG/3ML) 0.083% nebulizer solution Take 3 mLs (2.5 mg total) by nebulization every 6 (six) hours as needed for wheezing  or shortness of breath.   albuterol  (VENTOLIN  HFA) 108 (90 Base) MCG/ACT inhaler Inhale 2 puffs into the lungs every 6 (six) hours as needed for wheezing or shortness of breath.   aspirin  EC 81 MG tablet Take 81 mg by mouth daily.   atorvastatin  (LIPITOR) 20 MG tablet Take 20 mg by mouth at bedtime.   busPIRone  (BUSPAR ) 7.5 MG tablet Take 7.5 mg by mouth 3 (three) times daily.   cetirizine (ZYRTEC) 10 MG tablet Take 10 mg by mouth daily.   cyanocobalamin  (VITAMIN B12) 500 MCG tablet Take 1 tablet (500 mcg total) by mouth daily.   cyclobenzaprine  (FLEXERIL ) 10 MG tablet Take 10 mg by mouth at bedtime.   Dextromethorphan -guaiFENesin  20-200 MG/20ML LIQD Take 20  mLs by mouth every 6 (six) hours as needed.   Ensifentrine  3 MG/2.5ML SUSP Inhale 3 mg into the lungs in the morning and at bedtime.   esomeprazole (NEXIUM) 40 MG capsule Take 40 mg by mouth daily.    Fluticasone -Umeclidin-Vilant (TRELEGY ELLIPTA ) 200-62.5-25 MCG/ACT AEPB Inhale 1 puff into the lungs daily.   folic acid  (FOLVITE ) 1 MG tablet Take 1 mg by mouth daily.   hydroxyurea  (HYDREA ) 500 MG capsule Take 500 mg by mouth 2 (two) times daily. May take with food to minimize GI side effects.   ipratropium-albuterol  (DUONEB) 0.5-2.5 (3) MG/3ML SOLN Take 3 mLs by nebulization every 6 (six) hours for 7 days.   isosorbide  mononitrate (IMDUR ) 30 MG 24 hr tablet Take 30 mg by mouth daily.   Multiple Vitamins-Minerals (MULTIVIT/MULTIMINERAL ADULT PO) Take 1 tablet by mouth daily.   propranolol  (INDERAL ) 40 MG tablet Take 40 mg by mouth 2 (two) times daily.   sertraline  (ZOLOFT ) 100 MG tablet Take 100 mg by mouth daily.   triamcinolone ointment (KENALOG) 0.1 % Apply 1 Application topically 2 (two) times daily as needed.     Allergies:   Aspirin , Pregabalin, Trazodone, and Tramadol   Social History   Socioeconomic History   Marital status: Single    Spouse name: Not on file   Number of children: Not on file   Years of education: Not on file   Highest education level: Not on file  Occupational History    Comment: disabled  Tobacco Use   Smoking status: Former    Current packs/day: 0.00    Average packs/day: 2.8 packs/day for 29.0 years (79.8 ttl pk-yrs)    Types: Cigarettes    Start date: 10/1991    Quit date: 10/2020    Years since quitting: 3.9   Smokeless tobacco: Never  Vaping Use   Vaping status: Never Used  Substance and Sexual Activity   Alcohol use: No    Alcohol/week: 0.0 standard drinks of alcohol   Drug use: No   Sexual activity: Not on file  Other Topics Concern   Not on file  Social History Narrative   single   Social Drivers of Health   Financial Resource Strain:  Not on file  Food Insecurity: No Food Insecurity (09/14/2024)   Hunger Vital Sign    Worried About Running Out of Food in the Last Year: Never true    Ran Out of Food in the Last Year: Never true  Transportation Needs: No Transportation Needs (09/14/2024)   PRAPARE - Administrator, Civil Service (Medical): No    Lack of Transportation (Non-Medical): No  Physical Activity: Not on file  Stress: Not on file  Social Connections: Moderately Isolated (04/13/2024)   Social Connection and  Isolation Panel    Frequency of Communication with Friends and Family: Twice a week    Frequency of Social Gatherings with Friends and Family: Never    Attends Religious Services: 1 to 4 times per year    Active Member of [employer] or Organizations: No    Attends Engineer, Structural: Never    Marital Status: Married     Family History: The patient's family history includes Breast cancer in her maternal grandmother; Cancer in her father and mother; Diabetes in her father; Heart attack in her maternal grandmother; Heart disease in her sister; Hypertension in her sister; Stroke in her father.  ROS:   Please see the history of present illness.     All other systems reviewed and are negative.  EKGs/Labs/Other Studies Reviewed:    The following studies were reviewed today:  EKG Interpretation Date/Time:  Friday October 01 2024 10:58:19 EST Ventricular Rate:  91 PR Interval:  158 QRS Duration:  80 QT Interval:  354 QTC Calculation: 435 R Axis:   27  Text Interpretation: Normal sinus rhythm Normal ECG Confirmed by Darliss Rogue ([id number]) on 10/01/2024 11:30:26 AM    Recent Labs: 04/09/2024: ALT 39 04/12/2024: B Natriuretic Peptide 191.8 09/14/2024: TSH 1.430 09/17/2024: BUN 13; Creatinine, Ser 0.63; Hemoglobin 11.9; Magnesium  1.9; Platelets 224; Potassium 3.6; Sodium 141  Recent Lipid Panel    Component Value Date/Time   CHOL 183 09/14/2024 2231   CHOL 109 01/01/2014 0540    TRIG 218 (H) 09/14/2024 2231   TRIG 175 01/01/2014 0540   HDL 46 09/14/2024 2231   HDL 34 (L) 01/01/2014 0540   CHOLHDL 4.0 09/14/2024 2231   VLDL 44 (H) 09/14/2024 2231   VLDL 35 01/01/2014 0540   LDLCALC 93 09/14/2024 2231   LDLCALC 40 01/01/2014 0540     Risk Assessment/Calculations:             Physical Exam:    VS:  BP 90/62 (BP Location: Left Arm, Patient Position: Sitting)   Pulse 91   Ht 4' 11 (1.499 m)   Wt 159 lb (72.1 kg)   SpO2 98%   BMI 32.11 kg/m     Wt Readings from Last 3 Encounters:  10/01/24 159 lb (72.1 kg)  09/29/24 157 lb 12.8 oz (71.6 kg)  09/14/24 160 lb (72.6 kg)     GEN:  Well nourished, well developed in no acute distress HEENT: Normal NECK: No JVD; No carotid bruits CARDIAC: RRR, no murmurs, rubs, gallops RESPIRATORY:  Clear to auscultation without rales, wheezing or rhonchi  ABDOMEN: Soft, non-tender, non-distended MUSCULOSKELETAL:  No edema; chest wall tenderness on palpation SKIN: Warm and dry NEUROLOGIC:  Alert and oriented x 3 PSYCHIATRIC:  Normal affect   ASSESSMENT:    1. Chest pain of uncertain etiology   2. Pure hypercholesterolemia    PLAN:    In order of problems listed above:  Chest pain, symptoms very atypical, most likely noncardiac.  Chest wall tenderness and epigastric area reproducible with palpation suggesting musculoskeletal etiology.  Echocardiogram with normal EF.  Left heart cath 2017 no CAD.  Obtain Lexiscan Myoview due to family history, risk factors. Hyperlipidemia, continue Lipitor 20 mg daily.  Follow-up in 3 months     Informed Consent   Shared Decision Making/Informed Consent The risks [chest pain, shortness of breath, cardiac arrhythmias, dizziness, blood pressure fluctuations, myocardial infarction, stroke/transient ischemic attack, nausea, vomiting, allergic reaction, radiation exposure, metallic taste sensation and life-threatening complications (estimated to be 1 in 10,000)],  benefits (risk  stratification, diagnosing coronary artery disease, treatment guidance) and alternatives of a nuclear stress test were discussed in detail with Lauren Escobar and she agrees to proceed.      Medication Adjustments/Labs and Tests Ordered: Current medicines are reviewed at length with the patient today.  Concerns regarding medicines are outlined above.  Orders Placed This Encounter  Procedures   NM Myocar Multi W/Spect W/Wall Motion / EF   EKG 12-Lead   No orders of the defined types were placed in this encounter.   Patient Instructions  Medication Instructions:  Your physician recommends that you continue on your current medications as directed. Please refer to the Current Medication list given to you today.   *If you need a refill on your cardiac medications before your next appointment, please call your pharmacy*  Lab Work: No labs ordered today  If you have labs (blood work) drawn today and your tests are completely normal, you will receive your results only by: MyChart Message (if you have MyChart) OR A paper copy in the mail If you have any lab test that is abnormal or we need to change your treatment, we will call you to review the results.  Testing/Procedures: Your provider has ordered a Lexiscan/ Exercise Myoview Stress test. This will take place at [facility]. Please report to the [facility] medical mall entrance. The volunteers at the first desk will direct you where to go.  [facility] MYOVIEW  Your provider has ordered a Stress Test with nuclear imaging. The purpose of this test is to evaluate the blood supply to your heart muscle. This procedure is referred to as a Non-Invasive Stress Test. This is because other than having an IV started in your vein, nothing is inserted or invades your body. Cardiac stress tests are done to find areas of poor blood flow to the heart by determining the extent of coronary artery disease (CAD). Some patients exercise on a treadmill, which naturally increases  the blood flow to your heart, while others who are unable to walk on a treadmill due to physical limitations will have a pharmacologic/chemical stress agent called Lexiscan . This medicine will mimic walking on a treadmill by temporarily increasing your coronary blood flow.   Please note: these test may take anywhere between 2-4 hours to complete  How to prepare for your Myoview test:  Nothing to eat for 6 hours prior to the test No caffeine for 24 hours prior to test No smoking 24 hours prior to test. Your medication may be taken with water.  If your doctor stopped a medication because of this test, do not take that medication. Ladies, please do not wear dresses.  Skirts or pants are appropriate. Please wear a short sleeve shirt. No perfume, cologne or lotion. Wear comfortable walking shoes. No heels!   PLEASE NOTIFY THE OFFICE AT LEAST 24 HOURS IN ADVANCE IF YOU ARE UNABLE TO KEEP YOUR APPOINTMENT.  [phone] AND  PLEASE NOTIFY NUCLEAR MEDICINE AT [facility] AT LEAST 24 HOURS IN ADVANCE IF YOU ARE UNABLE TO KEEP YOUR APPOINTMENT. [phone]   Follow-Up: At [facility], you and your health needs are our priority.  As part of our continuing mission to provide you with exceptional heart care, our providers are all part of one team.  This team includes your primary Cardiologist (physician) and Advanced Practice Providers or APPs (Physician Assistants and Nurse Practitioners) who all work together to provide you with the care you need, when you need it.  Your next  appointment:   3 month(s)  Provider:   You may see Dr Darliss or one of the following Advanced Practice Providers on your designated Care Team:   Lonni Meager, NP Lesley Maffucci, PA-C Bernardino Bring, PA-C Cadence Norge, PA-C Tylene Lunch, NP Barnie Hila, NP    We recommend signing up for the patient portal called MyChart.  Sign up information is provided on this After Visit Summary.  MyChart is used to  connect with patients for Virtual Visits (Telemedicine).  Patients are able to view lab/test results, encounter notes, upcoming appointments, etc.  Non-urgent messages can be sent to your provider as well.   To learn more about what you can do with MyChart, go to [email or web address].             Signed, Redell Darliss, MD  10/01/2024 12:09 PM    [facility]

## 2024-10-05 ENCOUNTER — Telehealth: Payer: Self-pay

## 2024-10-05 ENCOUNTER — Encounter

## 2024-10-05 DIAGNOSIS — J432 Centrilobular emphysema: Secondary | ICD-10-CM

## 2024-10-05 NOTE — Progress Notes (Signed)
 Pulmonary Individual Treatment Plan  Patient Details  Name: Lauren Escobar MRN: [id number] Date of Birth: [birth year] Referring Provider:   Flowsheet Row Pulmonary Rehab from 04/28/2024 in [facility] Cardiac and Pulmonary Rehab  Referring Provider [name] MD    Initial Encounter Date:  Flowsheet Row Pulmonary Rehab from 04/28/2024 in [facility] Cardiac and Pulmonary Rehab  Date 04/28/24    Visit Diagnosis: Centrilobular emphysema (HCC)  Patient's Home Medications on Admission:  Current Outpatient Medications:    albuterol  (PROVENTIL ) (2.5 MG/3ML) 0.083% nebulizer solution, Take 3 mLs (2.5 mg total) by nebulization every 6 (six) hours as needed for wheezing or shortness of breath., Disp: 75 mL, Rfl: 12   albuterol  (VENTOLIN  HFA) 108 (90 Base) MCG/ACT inhaler, Inhale 2 puffs into the lungs every 6 (six) hours as needed for wheezing or shortness of breath., Disp: 18 g, Rfl: 2   aspirin  EC 81 MG tablet, Take 81 mg by mouth daily., Disp: , Rfl:    atorvastatin  (LIPITOR) 20 MG tablet, Take 20 mg by mouth at bedtime., Disp: , Rfl:    busPIRone  (BUSPAR ) 7.5 MG tablet, Take 7.5 mg by mouth 3 (three) times daily., Disp: , Rfl:    cetirizine (ZYRTEC) 10 MG tablet, Take 10 mg by mouth daily., Disp: , Rfl:    cyanocobalamin  (VITAMIN B12) 500 MCG tablet, Take 1 tablet (500 mcg total) by mouth daily., Disp: 30 tablet, Rfl: 2   cyclobenzaprine  (FLEXERIL ) 10 MG tablet, Take 10 mg by mouth at bedtime., Disp: , Rfl:    Dextromethorphan -guaiFENesin  20-200 MG/20ML LIQD, Take 20 mLs by mouth every 6 (six) hours as needed., Disp: 236 mL, Rfl: 0   Ensifentrine  3 MG/2.5ML SUSP, Inhale 3 mg into the lungs in the morning and at bedtime., Disp: , Rfl:    esomeprazole (NEXIUM) 40 MG capsule, Take 40 mg by mouth daily. , Disp: , Rfl:    Fluticasone -Umeclidin-Vilant (TRELEGY ELLIPTA ) 200-62.5-25 MCG/ACT AEPB, Inhale 1 puff into the lungs daily., Disp: 60 each, Rfl: 11   folic acid  (FOLVITE ) 1 MG tablet, Take 1 mg by mouth  daily., Disp: , Rfl:    hydroxyurea  (HYDREA ) 500 MG capsule, Take 500 mg by mouth 2 (two) times daily. May take with food to minimize GI side effects., Disp: , Rfl:    ipratropium-albuterol  (DUONEB) 0.5-2.5 (3) MG/3ML SOLN, Take 3 mLs by nebulization every 6 (six) hours for 7 days., Disp: 90 mL, Rfl: 0   isosorbide  mononitrate (IMDUR ) 30 MG 24 hr tablet, Take 30 mg by mouth daily., Disp: , Rfl:    Multiple Vitamins-Minerals (MULTIVIT/MULTIMINERAL ADULT PO), Take 1 tablet by mouth daily., Disp: , Rfl:    propranolol  (INDERAL ) 40 MG tablet, Take 40 mg by mouth 2 (two) times daily., Disp: , Rfl:    sertraline  (ZOLOFT ) 100 MG tablet, Take 100 mg by mouth daily., Disp: , Rfl:    triamcinolone ointment (KENALOG) 0.1 %, Apply 1 Application topically 2 (two) times daily as needed., Disp: , Rfl:   Past Medical History: Past Medical History:  Diagnosis Date   Abdominal pain    Abnormal mammogram    Anxiety    Asthma    Bleeding disorder    Blood dyscrasia    polycythemia vera   Chronic kidney disease    COPD (chronic obstructive pulmonary disease) (HCC)    Cough    Depression    Diabetes mellitus without complication (HCC)    Fatigue    Headache 2000   Horizontal nystagmus    age 58  Hyperglycemia    Hypertension    Low back pain    Major depressive disorder    Nasal lesion    Neck pain    Snoring    SOB (shortness of breath)    Tobacco use    UTI (urinary tract infection)    Von Willebrand's disease (HCC)     Tobacco Use: Social History   Tobacco Use  Smoking Status Former   Current packs/day: 0.00   Average packs/day: 2.8 packs/day for 29.0 years (79.8 ttl pk-yrs)   Types: Cigarettes   Start date: 10/1991   Quit date: 10/2020   Years since quitting: 3.9  Smokeless Tobacco Never    Labs: Review Flowsheet  More data exists      Latest Ref Rng & Units 06/16/2017 10/15/2022 01/07/2024 04/12/2024 09/14/2024  Labs for ITP Cardiac and Pulmonary Rehab  Cholestrol 0 - 200  mg/dL - 824  - - 816   LDL (calc) 0 - 99 mg/dL - 92  - - 93   HDL-C >59 mg/dL - 49  - - 46   Trlycerides <150 mg/dL - 827  - - 781   Hemoglobin A1c 4.8 - 5.6 % 5.5  6.1  - - 5.1   Bicarbonate 20.0 - 28.0 mmol/L - 23.4  26.5  27.3  -  Acid-base deficit 0.0 - 2.0 mmol/L - 0.7  - - -  O2 Saturation % - 84.1  58.3  38  -     Pulmonary Assessment Scores:  Pulmonary Assessment Scores     Row Name 04/28/24 1546 09/02/24 1125 09/07/24 1110     ADL UCSD   ADL Phase Entry -- Exit   SOB Score total 78 -- 65   Rest 3 -- 1   Walk 4 -- 5   Stairs 5 -- 5   Bath 3 -- 4   Dress 3 -- 2   Shop 4 -- 2     CAT Score   CAT Score 29 -- 23     mMRC Score   mMRC Score 4 2 --      UCSD: Self-administered rating of dyspnea associated with activities of daily living (ADLs) 6-point scale (0 = not at all to 5 = maximal or unable to do because of breathlessness)  Scoring Scores range from 0 to 120.  Minimally important difference is 5 units  CAT: CAT can identify the health impairment of COPD patients and is better correlated with disease progression.  CAT has a scoring range of zero to 40. The CAT score is classified into four groups of low (less than 10), medium (10 - 20), high (21-30) and very high (31-40) based on the impact level of disease on health status. A CAT score over 10 suggests significant symptoms.  A worsening CAT score could be explained by an exacerbation, poor medication adherence, poor inhaler technique, or progression of COPD or comorbid conditions.  CAT MCID is 2 points  mMRC: mMRC (Modified Medical Research Council) Dyspnea Scale is used to assess the degree of baseline functional disability in patients of respiratory disease due to dyspnea. No minimal important difference is established. A decrease in score of 1 point or greater is considered a positive change.   Pulmonary Function Assessment:   Exercise Target Goals: Exercise Program Goal: Individual exercise  prescription set using results from initial 6 min walk test and THRR while considering  patient's activity barriers and safety.   Exercise Prescription Goal: Initial exercise prescription builds to 30-45  minutes a day of aerobic activity, 2-3 days per week.  Home exercise guidelines will be given to patient during program as part of exercise prescription that the participant will acknowledge.  Education: Aerobic Exercise: - Group verbal and visual presentation on the components of exercise prescription. Introduces F.I.T.T principle from ACSM for exercise prescriptions.  Reviews F.I.T.T. principles of aerobic exercise including progression. Written material provided at class time.   Education: Resistance Exercise: - Group verbal and visual presentation on the components of exercise prescription. Introduces F.I.T.T principle from ACSM for exercise prescriptions  Reviews F.I.T.T. principles of resistance exercise including progression. Written material provided at class time. Flowsheet Row Pulmonary Rehab from 12/25/2022 in [facility] Cardiac and Pulmonary Rehab  Date 11/20/22  Educator [facility]  Instruction Review Code 1- Verbalizes Understanding     Education: Exercise & Equipment Safety: - Individual verbal instruction and demonstration of equipment use and safety with use of the equipment. Flowsheet Row Pulmonary Rehab from 04/28/2024 in [facility] Cardiac and Pulmonary Rehab  Date 04/28/24  Educator [facility]  Instruction Review Code 1- Verbalizes Understanding    Education: Exercise Physiology & General Exercise Guidelines: - Group verbal and written instruction with models to review the exercise physiology of the cardiovascular system and associated critical values. Provides general exercise guidelines with specific guidelines to those with heart or lung disease.    Education: Flexibility, Balance, Mind/Body Relaxation: - Group verbal and visual presentation with interactive activity on the components of  exercise prescription. Introduces F.I.T.T principle from ACSM for exercise prescriptions. Reviews F.I.T.T. principles of flexibility and balance exercise training including progression. Also discusses the mind body connection.  Reviews various relaxation techniques to help reduce and manage stress (i.e. Deep breathing, progressive muscle relaxation, and visualization). Balance handout provided to take home. Written material provided at class time. Flowsheet Row Pulmonary Rehab from 12/25/2022 in [facility] Cardiac and Pulmonary Rehab  Date 11/20/22  Educator [facility]  Instruction Review Code 1- Verbalizes Understanding    Activity Barriers & Risk Stratification:  Activity Barriers & Cardiac Risk Stratification - 04/28/24 1542       Activity Barriers & Cardiac Risk Stratification   Activity Barriers Left Knee Replacement;Back Problems;Shortness of Breath;Balance Concerns          6 Minute Walk:  6 Minute Walk     Row Name 04/28/24 1538 09/02/24 1118       6 Minute Walk   Phase Initial Discharge    Distance 1170 feet 1300 feet    Distance % Change -- 11 %    Distance Feet Change -- 130 ft    Walk Time 6 minutes 6 minutes    # of Rest Breaks 0 0    MPH 2.2 2.46    METS 3.13 3.39    RPE 19 13    Perceived Dyspnea  2 1    VO2 Peak -- 11.88    Symptoms Yes (comment) No    Comments Chest pain 9/10 and resolved to a 7/10 at rest --    Resting HR 93 bpm 97 bpm    Resting BP 100/70 104/60    Resting Oxygen Saturation  96 % 96 %    Exercise Oxygen Saturation  during 6 min walk 91 % 96 %    Max Ex. HR 117 bpm 114 bpm    Max Ex. BP 120/78 128/60    2 Minute Post BP 92/70 104/62      Interval HR   1 Minute HR 100 108  2 Minute HR 117 114    3 Minute HR 112 107    4 Minute HR 110 109    5 Minute HR 87 111    6 Minute HR 95 114    2 Minute Post HR 91 95    Interval Heart Rate? Yes Yes      Interval Oxygen   Interval Oxygen? Yes Yes    Baseline Oxygen Saturation % 96 % 96 %    1 Minute  Oxygen Saturation % 91 % 96 %    1 Minute Liters of Oxygen 0 L 0 L    2 Minute Oxygen Saturation % 91 % 96 %    2 Minute Liters of Oxygen 0 L 0 L    3 Minute Oxygen Saturation % 94 % 96 %    3 Minute Liters of Oxygen 0 L 0 L    4 Minute Oxygen Saturation % 94 % 97 %    4 Minute Liters of Oxygen 0 L 0 L    5 Minute Oxygen Saturation % 94 % 97 %    5 Minute Liters of Oxygen 0 L 0 L    6 Minute Oxygen Saturation % 93 % 98 %    6 Minute Liters of Oxygen 0 L 0 L    2 Minute Post Oxygen Saturation % 98 % 98 %    2 Minute Post Liters of Oxygen 0 L 0 L      Oxygen Initial Assessment:  Oxygen Initial Assessment - 04/27/24 1304       Home Oxygen   Home Oxygen Device None    Sleep Oxygen Prescription None    Home Exercise Oxygen Prescription None    Home Resting Oxygen Prescription None    Compliance with Home Oxygen Use Yes      Intervention   Short Term Goals To learn and understand importance of maintaining oxygen saturations>88%;To learn and understand importance of monitoring SPO2 with pulse oximeter and demonstrate accurate use of the pulse oximeter.;To learn and demonstrate proper pursed lip breathing techniques or other breathing techniques. ;To learn and demonstrate proper use of respiratory medications    Long  Term Goals Verbalizes importance of monitoring SPO2 with pulse oximeter and return demonstration;Maintenance of O2 saturations>88%;Exhibits proper breathing techniques, such as pursed lip breathing or other method taught during program session;Compliance with respiratory medication;Demonstrates proper use of MDI's          Oxygen Re-Evaluation:  Oxygen Re-Evaluation     Row Name 05/04/24 1056 07/08/24 1129 08/31/24 1110         Program Oxygen Prescription   Program Oxygen Prescription None None None       Home Oxygen   Home Oxygen Device None None None     Sleep Oxygen Prescription None None None     Home Exercise Oxygen Prescription None None None     Home  Resting Oxygen Prescription None None None     Compliance with Home Oxygen Use Yes Yes Yes       Goals/Expected Outcomes   Short Term Goals To learn and understand importance of maintaining oxygen saturations>88%;To learn and understand importance of monitoring SPO2 with pulse oximeter and demonstrate accurate use of the pulse oximeter.;To learn and demonstrate proper pursed lip breathing techniques or other breathing techniques. ;To learn and demonstrate proper use of respiratory medications To learn and understand importance of maintaining oxygen saturations>88%;To learn and understand importance of monitoring SPO2 with pulse oximeter and demonstrate  accurate use of the pulse oximeter.;To learn and demonstrate proper pursed lip breathing techniques or other breathing techniques. ;To learn and demonstrate proper use of respiratory medications To learn and understand importance of maintaining oxygen saturations>88%     Long  Term Goals Verbalizes importance of monitoring SPO2 with pulse oximeter and return demonstration;Maintenance of O2 saturations>88%;Exhibits proper breathing techniques, such as pursed lip breathing or other method taught during program session;Compliance with respiratory medication;Demonstrates proper use of MDI's Verbalizes importance of monitoring SPO2 with pulse oximeter and return demonstration;Maintenance of O2 saturations>88%;Exhibits proper breathing techniques, such as pursed lip breathing or other method taught during program session;Compliance with respiratory medication;Demonstrates proper use of MDI's Exhibits proper breathing techniques, such as pursed lip breathing or other method taught during program session     Comments Reviewed PLB technique with pt.  Talked about how it works and it's importance in maintaining their exercise saturations. We reviewed PLB technique with pt. She has been practicing her PLB technique while exercising. She states that it is hardest for her to  breath when she begins to walk faster. We talked about how PLB works and it's importance in maintaining her exercise saturations. Reviewed PLB technique, she verbalized understanding.     Goals/Expected Outcomes Short: Become more profiecient at using PLB. Long: Become independent at using PLB. Short: Continue to practice PLB with exercise. Long: Become independent at using PLB. STG: Continue to practice PLB with exercise. LTG: Become independent at using PLB.        Oxygen Discharge (Final Oxygen Re-Evaluation):  Oxygen Re-Evaluation - 08/31/24 1110       Program Oxygen Prescription   Program Oxygen Prescription None      Home Oxygen   Home Oxygen Device None    Sleep Oxygen Prescription None    Home Exercise Oxygen Prescription None    Home Resting Oxygen Prescription None    Compliance with Home Oxygen Use Yes      Goals/Expected Outcomes   Short Term Goals To learn and understand importance of maintaining oxygen saturations>88%    Long  Term Goals Exhibits proper breathing techniques, such as pursed lip breathing or other method taught during program session    Comments Reviewed PLB technique, she verbalized understanding.    Goals/Expected Outcomes STG: Continue to practice PLB with exercise. LTG: Become independent at using PLB.          Initial Exercise Prescription:  Initial Exercise Prescription - 04/28/24 1500       Date of Initial Exercise RX and Referring Provider   Date 04/28/24    Referring Provider [name] MD      Oxygen   Maintain Oxygen Saturation 88% or higher      Recumbant Bike   Level 2    RPM 50    Watts 25    Minutes 15    METs 3.13      NuStep   Level 2    SPM 80    Minutes 15    METs 3.13      REL-XR   Level 1    Speed 50    Minutes 15    METs 3.13      T5 Nustep   Level 2    SPM 80    Minutes 15    METs 3.13      Track   Laps 26    Minutes 15    METs 2.41      Prescription Details   Frequency (times  per week) 2     Duration Progress to 30 minutes of continuous aerobic without signs/symptoms of physical distress      Intensity   THRR 40-80% of Max Heartrate 117-147    Ratings of Perceived Exertion 11-13    Perceived Dyspnea 0-4      Progression   Progression Continue to progress workloads to maintain intensity without signs/symptoms of physical distress.      Resistance Training   Training Prescription Yes    Weight 4 lb    Reps 10-15          Perform Capillary Blood Glucose checks as needed.  Exercise Prescription Changes:   Exercise Prescription Changes     Row Name 04/28/24 1500 05/18/24 1400 05/31/24 1600 06/17/24 0900 06/29/24 1000     Response to Exercise   Blood Pressure (Admit) 100/70 124/68 132/68 96/52 96/60    Blood Pressure (Exercise) 120/78 120/60 126/68 130/70 --   Blood Pressure (Exit) 92/70 118/62 102/60 118/60 104/54   Heart Rate (Admit) 93 bpm 90 bpm 107 bpm 103 bpm 85 bpm   Heart Rate (Exercise) 117 bpm 98 bpm 106 bpm 108 bpm 97 bpm   Heart Rate (Exit) 88 bpm 89 bpm 89 bpm 92 bpm 82 bpm   Oxygen Saturation (Admit) 96 % 95 % 97 % 95 % 96 %   Oxygen Saturation (Exercise) 91 % 92 % 96 % 91 % 95 %   Oxygen Saturation (Exit) 98 % 93 % 98 % 97 % 96 %   Rating of Perceived Exertion (Exercise) 19 14 15 14 15    Perceived Dyspnea (Exercise) 2 1 2 2 2    Symptoms chest pain 9/10, resolved to 7/10 with rest none none none none   Comments results 1st 2 weeks of exercise sessions -- -- --   Duration -- Progress to 30 minutes of  aerobic without signs/symptoms of physical distress Continue with 30 min of aerobic exercise without signs/symptoms of physical distress. Continue with 30 min of aerobic exercise without signs/symptoms of physical distress. Continue with 30 min of aerobic exercise without signs/symptoms of physical distress.   Intensity THRR New THRR unchanged THRR unchanged THRR unchanged THRR unchanged     Progression   Progression -- Continue to progress  workloads to maintain intensity without signs/symptoms of physical distress. Continue to progress workloads to maintain intensity without signs/symptoms of physical distress. Continue to progress workloads to maintain intensity without signs/symptoms of physical distress. Continue to progress workloads to maintain intensity without signs/symptoms of physical distress.   Average METs 3.13 2.37 2.78 2.52 2.55     Resistance Training   Training Prescription -- Yes Yes Yes Yes   Weight -- 4 lb 4 lb 4 lb 4 lb   Reps -- 10-15 10-15 10-15 10-15     Interval Training   Interval Training -- No No No No     Treadmill   MPH -- -- -- 1.5 1.6   Grade -- -- -- -- 0   Minutes -- -- -- 15 15   METs -- -- -- 2.15 2.23     NuStep   Level -- 2 3 2  --   Minutes -- 15 15 15  --   METs -- 2.2 2.4 2 --     REL-XR   Level -- 2 2 4 5    Minutes -- 15 15 15 15    METs -- 2.7 3.7 -- 3.5     Track   Laps -- 30 30 17  17   Minutes -- 15 15 15 15    METs -- 2.63 2.63 1.92 1.92     Oxygen   Maintain Oxygen Saturation -- 88% or higher 88% or higher 88% or higher 88% or higher    Row Name 07/13/24 1100 07/15/24 0800 07/26/24 1000 08/11/24 1100 08/25/24 0700     Response to Exercise   Blood Pressure (Admit) -- 94/56 104/62 104/60 106/60   Blood Pressure (Exit) -- 112/60 104/58 96/56 114/62   Heart Rate (Admit) -- 80 bpm 95 bpm 85 bpm 115 bpm   Heart Rate (Exercise) -- 93 bpm 112 bpm 109 bpm 139 bpm   Heart Rate (Exit) -- 85 bpm 83 bpm 89 bpm 130 bpm   Oxygen Saturation (Admit) -- 97 % 99 % 96 % 98 %   Oxygen Saturation (Exercise) -- 96 % 90 % 91 % 96 %   Oxygen Saturation (Exit) -- 97 % 97 % 95 % 97 %   Rating of Perceived Exertion (Exercise) -- 14 15 13 15    Perceived Dyspnea (Exercise) -- 2 1 3 2    Symptoms -- none none none none   Duration -- Continue with 30 min of aerobic exercise without signs/symptoms of physical distress. Continue with 30 min of aerobic exercise without signs/symptoms of physical  distress. Continue with 30 min of aerobic exercise without signs/symptoms of physical distress. Continue with 30 min of aerobic exercise without signs/symptoms of physical distress.   Intensity -- THRR unchanged THRR unchanged THRR unchanged THRR unchanged     Progression   Progression -- Continue to progress workloads to maintain intensity without signs/symptoms of physical distress. Continue to progress workloads to maintain intensity without signs/symptoms of physical distress. Continue to progress workloads to maintain intensity without signs/symptoms of physical distress. Continue to progress workloads to maintain intensity without signs/symptoms of physical distress.   Average METs -- 2.28 2.73 3.02 2.9     Resistance Training   Training Prescription -- Yes Yes Yes Yes   Weight -- 4 lb 4 lb 4 lb 4 lb   Reps -- 10-15 10-15 10-15 10-15     Interval Training   Interval Training -- No No No No     Treadmill   MPH -- 2.1 2.1 2.2 2.2   Grade -- 0 0 0 0   Minutes -- 15 15 15 15    METs -- 2.61 2.61 2.69 2.69     REL-XR   Level -- 4 6 6 8    Minutes -- 15 15 15 15    METs -- -- 2.6 3.7 3.2     Home Exercise Plan   Plans to continue exercise at Home (comment)  Walking at home on days away from rehab Home (comment)  Walking at home on days away from rehab Home (comment)  Walking at home on days away from rehab Home (comment)  Walking at home on days away from rehab Home (comment)  Walking at home on days away from rehab   Frequency Add 1 additional day to program exercise sessions. Add 1 additional day to program exercise sessions. Add 1 additional day to program exercise sessions. Add 1 additional day to program exercise sessions. Add 1 additional day to program exercise sessions.   Initial Home Exercises Provided 07/13/24 07/13/24 07/13/24 07/13/24 07/13/24     Oxygen   Maintain Oxygen Saturation 88% or higher 88% or higher 88% or higher 88% or higher 88% or higher    Row Name 09/09/24  1100 09/27/24  1100           Response to Exercise   Blood Pressure (Admit) 104/60 106/60      Blood Pressure (Exit) 108/68 108/56      Heart Rate (Admit) 97 bpm 85 bpm      Heart Rate (Exercise) 126 bpm 123 bpm      Heart Rate (Exit) 105 bpm 81 bpm      Oxygen Saturation (Admit) 98 % 95 %      Oxygen Saturation (Exercise) 93 % 92 %      Oxygen Saturation (Exit) 98 % 98 %      Rating of Perceived Exertion (Exercise) 15 14      Perceived Dyspnea (Exercise) 3 2      Symptoms none none      Duration Continue with 30 min of aerobic exercise without signs/symptoms of physical distress. Continue with 30 min of aerobic exercise without signs/symptoms of physical distress.      Intensity THRR unchanged THRR unchanged        Progression   Progression Continue to progress workloads to maintain intensity without signs/symptoms of physical distress. Continue to progress workloads to maintain intensity without signs/symptoms of physical distress.      Average METs 3.1 3.17        Resistance Training   Training Prescription Yes Yes      Weight 4 lb 4 lb      Reps 10-15 10-15        Interval Training   Interval Training No No        Treadmill   MPH 2.4 2.4      Grade 0 0      Minutes 15 15      METs 2.84 2.84        REL-XR   Level 6 7      Minutes 15 15      METs 4.3 4        Home Exercise Plan   Plans to continue exercise at Home (comment)  Walking at home on days away from rehab Home (comment)  Walking at home on days away from rehab      Frequency Add 1 additional day to program exercise sessions. Add 1 additional day to program exercise sessions.      Initial Home Exercises Provided 07/13/24 07/13/24        Oxygen   Maintain Oxygen Saturation 88% or higher 88% or higher         Exercise Comments:   Exercise Comments     Row Name 05/04/24 1053 10/05/24 1101         Exercise Comments First full day of exercise!  Patient was oriented to gym and equipment including  functions, settings, policies, and procedures.  Patient's individual exercise prescription and treatment plan were reviewed.  All starting workloads were established based on the results of the 6 minute walk test done at initial orientation visit.  The plan for exercise progression was also introduced and progression will be customized based on patient's performance and goals. Eh graduated today from  rehab with 36 sessions completed.  Details of the patient's exercise prescription and what She needs to do in order to continue the prescription and progress were discussed with patient.  Patient was given a copy of prescription and goals.  Patient verbalized understanding. Earsie plans to continue to exercise by walking at home.         Exercise Goals and Review:  Exercise Goals     Row Name 04/28/24 1545             Exercise Goals   Increase Physical Activity Yes       Intervention Provide advice, education, support and counseling about physical activity/exercise needs.;Develop an individualized exercise prescription for aerobic and resistive training based on initial evaluation findings, risk stratification, comorbidities and participant's personal goals.       Expected Outcomes Long Term: Exercising regularly at least 3-5 days a week.;Long Term: Add in home exercise to make exercise part of routine and to increase amount of physical activity.;Short Term: Attend rehab on a regular basis to increase amount of physical activity.       Increase Strength and Stamina Yes       Intervention Provide advice, education, support and counseling about physical activity/exercise needs.;Develop an individualized exercise prescription for aerobic and resistive training based on initial evaluation findings, risk stratification, comorbidities and participant's personal goals.       Expected Outcomes Short Term: Increase workloads from initial exercise prescription for resistance, speed, and METs.;Short Term:  Perform resistance training exercises routinely during rehab and add in resistance training at home;Long Term: Improve cardiorespiratory fitness, muscular endurance and strength as measured by increased METs and functional capacity ( )       Able to understand and use rate of perceived exertion (RPE) scale Yes       Intervention Provide education and explanation on how to use RPE scale       Expected Outcomes Short Term: Able to use RPE daily in rehab to express subjective intensity level;Long Term:  Able to use RPE to guide intensity level when exercising independently       Able to understand and use Dyspnea scale Yes       Intervention Provide education and explanation on how to use Dyspnea scale       Expected Outcomes Short Term: Able to use Dyspnea scale daily in rehab to express subjective sense of shortness of breath during exertion;Long Term: Able to use Dyspnea scale to guide intensity level when exercising independently       Knowledge and understanding of Target Heart Rate Range (THRR) Yes       Intervention Provide education and explanation of THRR including how the numbers were predicted and where they are located for reference       Expected Outcomes Short Term: Able to state/look up THRR;Short Term: Able to use daily as guideline for intensity in rehab;Long Term: Able to use THRR to govern intensity when exercising independently       Able to check pulse independently Yes       Intervention Provide education and demonstration on how to check pulse in carotid and radial arteries.;Review the importance of being able to check your own pulse for safety during independent exercise       Expected Outcomes Short Term: Able to explain why pulse checking is important during independent exercise;Long Term: Able to check pulse independently and accurately       Understanding of Exercise Prescription Yes       Intervention Provide education, explanation, and written materials on patient's  individual exercise prescription       Expected Outcomes Short Term: Able to explain program exercise prescription;Long Term: Able to explain home exercise prescription to exercise independently          Exercise Goals Re-Evaluation :  Exercise Goals Re-Evaluation     Row Name 05/04/24 1053 05/18/24  1444 05/31/24 1618 06/17/24 0923 06/29/24 1017     Exercise Goal Re-Evaluation   Exercise Goals Review Increase Physical Activity;Able to understand and use rate of perceived exertion (RPE) scale;Knowledge and understanding of Target Heart Rate Range (THRR);Understanding of Exercise Prescription;Able to understand and use Dyspnea scale;Able to check pulse independently;Increase Strength and Stamina Increase Physical Activity;Understanding of Exercise Prescription;Increase Strength and Stamina Increase Physical Activity;Understanding of Exercise Prescription;Increase Strength and Stamina Increase Physical Activity;Understanding of Exercise Prescription;Increase Strength and Stamina Increase Physical Activity;Understanding of Exercise Prescription;Increase Strength and Stamina   Comments Reviewed RPE and dyspnea scale, THR and program prescription with pt today.  Pt voiced understanding and was given a copy of goals to take home. Jadalynn is off to a good start in the program and completed her first 2 weeks of exercise in this review. She tolerated her exercise prescription well. She worked at level 2 on the T4 nustep and XR. She walked 30 laps on the track. We will continue to monitor her progress in the program. Bryer is doing well in rehab. She continues to walk 30 laps on the track. She also recently improved to level 3 on the T4 nustep. We will continue to monitor her progress in the program. Queena is doing well in rehab. She was recently able to increase from level 2 to 4 on the XR. She was also able to add the treadmill to her current exercise prescription at a speed of 1.5 and no incline. We will  continue to monitor her progress in the program. Joia is doing well in rehab. She was recently able to increase her speed on the treadmill to 1.6 mph with no incline. She also improved to level 5 on the XR. We will continue to monitor her progress in the program.   Expected Outcomes Short: Use RPE daily to regulate intensity. Long: Follow program prescription in THR. Short: Continue to follow current exercise prescription. Long: Continue exercise to improve strength and stamina. Short: Continue to progressively increase workloads. Long: Continue exercise to improve strength and stamina. Short: Continue to follow exercise prescription, and increase workloads when able. Long: Continue exercise to improve strength and stamina. Short: Continue to progressively increase workloads. Long: Continue exercise to improve strength and stamina.    Row Name 07/13/24 1136 07/15/24 0816 07/26/24 1049 07/27/24 1124 08/11/24 1119     Exercise Goal Re-Evaluation   Exercise Goals Review Increase Physical Activity;Understanding of Exercise Prescription;Increase Strength and Stamina;Able to understand and use Dyspnea scale;Knowledge and understanding of Target Heart Rate Range (THRR);Able to check pulse independently;Able to understand and use rate of perceived exertion (RPE) scale Increase Physical Activity;Understanding of Exercise Prescription;Increase Strength and Stamina Increase Physical Activity;Understanding of Exercise Prescription;Increase Strength and Stamina Increase Physical Activity;Increase Strength and Stamina;Understanding of Exercise Prescription Increase Physical Activity;Increase Strength and Stamina;Understanding of Exercise Prescription   Comments Reviewed home exercise with pt today from 1125 to 1134.  Pt plans to walk at home for exercise on days away from rehab.  Reviewed THR, pulse, RPE, sign and symptoms, pulse oximetery and when to call 911 or MD.  Also discussed weather considerations and indoor  options.  Pt voiced understanding. Nivea is doing well in rehab. She increased her workload on the treadmill to a speed of 2.1 mph and no incline. She worked at level 4 on the XR. We will continue to monitor her progress in the program. Sarita continues to do well in rehab. She was recently able to increase from level 4 to 6  on the XR. She was also able to maintain a treadmill workload of 2. and no incline. We will continue to monitor her progress in the program. Kerrington is doing well at rehab. She is walking at home, both indoor and outdoors. Encouraged her to continue to exercise at home Chamari continues to do well in rehab. She recently increased her speed on the treadmill to 2.2 mph with no incline. She also continues to work at level 6 on the XR. We will continue to monitor her progress in the program.   Expected Outcomes Short: Begin walking at home on days away from rehab. Long: Continue to exercise independently. Short: Continue to progressively increase treadmill and XR workloads. Long: Continue exercise to improve strength and stamina. Short: Continue to increase XR workload. Long: Continue exercise to improve strength and stamina. Short: Continue to increase XR workload. Long: Continue exercise to improve strength and stamina. Short: Continue to progressively increase treadmill workload. Long: Continue exercise to improve strength and stamina.    Row Name 08/25/24 0725 08/31/24 1109 09/09/24 1106 09/27/24 1102       Exercise Goal Re-Evaluation   Exercise Goals Review Increase Physical Activity;Increase Strength and Stamina;Understanding of Exercise Prescription Increase Physical Activity;Increase Strength and Stamina;Understanding of Exercise Prescription Increase Physical Activity;Increase Strength and Stamina;Understanding of Exercise Prescription Increase Physical Activity;Increase Strength and Stamina;Understanding of Exercise Prescription    Comments Rossy is doing well in rehab. She was  recently able to increase from level 6 to 8 on the XR. She was also able to maintain her treadmill workload at a speed of 2. and no incline. We will continue to monitor her progress in the program. Jensine is doing well at rehab, she is walking indoors and outdoors when not at rehab. Encouraged her to continue to exercise outside of rehab. Takisha continues to do well in rehab. She recently completed her post-6MWT and was able to improve by 148ft. She was also able to increase her speed on the treadmill from 2.2 to 2.42mph. We will continue to monitor her progress in the program. Judyann is doing well in the program. She was recently able to increase from level 6 to 7 on the XR, and was able to maintain her speed on the treadmill at 2.2mph. We will continue to monitor her progress in the program.    Expected Outcomes Short: Continue to increase workloads when able, increase treadmill incline to 1%. Long: Continue exercise to improve strength and stamina. TTG: Increase workload as able. LTG: Continue exercise to improve strength and stamina. Short: Graduate. Long: Continue exercise to improve strength and stamina. Short: Graduate. Long: Continue exercise to improve strength and stamina.       Discharge Exercise Prescription (Final Exercise Prescription Changes):  Exercise Prescription Changes - 09/27/24 1100       Response to Exercise   Blood Pressure (Admit) 106/60    Blood Pressure (Exit) 108/56    Heart Rate (Admit) 85 bpm    Heart Rate (Exercise) 123 bpm    Heart Rate (Exit) 81 bpm    Oxygen Saturation (Admit) 95 %    Oxygen Saturation (Exercise) 92 %    Oxygen Saturation (Exit) 98 %    Rating of Perceived Exertion (Exercise) 14    Perceived Dyspnea (Exercise) 2    Symptoms none    Duration Continue with 30 min of aerobic exercise without signs/symptoms of physical distress.    Intensity THRR unchanged      Progression   Progression Continue to  progress workloads to maintain intensity  without signs/symptoms of physical distress.    Average METs 3.17      Resistance Training   Training Prescription Yes    Weight 4 lb    Reps 10-15      Interval Training   Interval Training No      Treadmill   MPH 2.4    Grade 0    Minutes 15    METs 2.84      REL-XR   Level 7    Minutes 15    METs 4      Home Exercise Plan   Plans to continue exercise at Home (comment)   Walking at home on days away from rehab   Frequency Add 1 additional day to program exercise sessions.    Initial Home Exercises Provided 07/13/24      Oxygen   Maintain Oxygen Saturation 88% or higher          Nutrition:  Target Goals: Understanding of nutrition guidelines, daily intake of sodium 1500mg , cholesterol 200mg , calories 30% from fat and 7% or less from saturated fats, daily to have 5 or more servings of fruits and vegetables.  Education: Nutrition 1 -Group instruction provided by verbal, written material, interactive activities, discussions, models, and posters to present general guidelines for heart healthy nutrition including macronutrients, label reading, and promoting whole foods over processed counterparts. Education serves as pensions consultant of discussion of heart healthy eating for all. Written material provided at class time.     Education: Nutrition 2 -Group instruction provided by verbal, written material, interactive activities, discussions, models, and posters to present general guidelines for heart healthy nutrition including sodium, cholesterol, and saturated fat. Providing guidance of habit forming to improve blood pressure, cholesterol, and body weight. Written material provided at class time.     Biometrics:  Pre Biometrics - 04/28/24 1546       Pre Biometrics   Height 5' 1.5 (1.562 m)    Weight 165 lb 12.8 oz (75.2 kg)    Waist Circumference 42.5 inches    Hip Circumference 42 inches    Waist to Hip Ratio 1.01 %    BMI (Calculated) 30.82    Single Leg Stand 1  seconds          Post Biometrics - 09/02/24 1123        Post  Biometrics   Height 5' 1.5 (1.562 m)    Weight 159 lb 14.4 oz (72.5 kg)    Waist Circumference 41 inches    Hip Circumference 40 inches    Waist to Hip Ratio 1.03 %    BMI (Calculated) 29.73    Single Leg Stand 1 seconds          Nutrition Therapy Plan and Nutrition Goals:  Nutrition Therapy & Goals - 04/28/24 1512       Nutrition Therapy   Diet Mediterranean    Protein (specify units) 70-90    Fiber 25 grams    Whole Grain Foods 3 servings    Saturated Fats 15 max. grams    Fruits and Vegetables 5 servings/day    Sodium 2 grams      Personal Nutrition Goals   Nutrition Goal Eat 3 times per day, small frequent meals or nutrient dense snacks (use protein shake if needed)    Personal Goal #2 Eat 15-30gProtein and 30-60gCarbs at each meal.    Personal Goal #3 Drink 16-32oz of water daily    Comments Patient doesn't drink  much water, mostly just met dew, about 16oz daily. She doesn't eat breakfast or lunch. Usually just dinner. Spoke with her about the importance of eating more throughout the day. She was not sure she would eat more than she already was. Suggested a protein shake as a good starting point. Breaking it into two 5.5oz halves if needed. Suggested pairing it with a carb, like fruit or crackers. Provided mediterranean diet handout. Kept goals simple to eat more and drink more water.      Intervention Plan   Intervention Prescribe, educate and counsel regarding individualized specific dietary modifications aiming towards targeted core components such as weight, hypertension, lipid management, diabetes, heart failure and other comorbidities.;Nutrition handout(s) given to patient.    Expected Outcomes Long Term Goal: Adherence to prescribed nutrition plan.;Short Term Goal: A plan has been developed with personal nutrition goals set during dietitian appointment.;Short Term Goal: Understand basic principles of  dietary content, such as calories, fat, sodium, cholesterol and nutrients.          Nutrition Assessments:  MEDIFICTS Score Key: >=70 Need to make dietary changes  40-70 Heart Healthy Diet <= 40 Therapeutic Level Cholesterol Diet  Flowsheet Row Pulmonary Rehab from 09/07/2024 in [facility] Card[facility] Pulmonary Re[facility]  Picture Your Plate Total Score on Admission 52  Picture Your Plate Total Score on Discharge 50   Picture Your Plate Scores: <59 Unhealthy dietary pattern with much room for improvement. 41-50 Dietary pattern unlikely to meet recommendations for good health and room for improvement. 51-60 More healthful dietary pattern, with some room for improvement.  >60 Healthy dietary pattern, although there may be some specific behaviors that could be improved.   Nutrition Goals Re-Evaluation:  Nutrition Goals Re-Evaluation     Row Name 07/08/24 1108 07/27/24 1117 08/31/24 1116         Goals   Comment Ciaira states that she still does not have much of an appetite and is only eating one meal a day. We encouraged her to try and add at least one more nutrient dense snack or meal so she can get enough nutrients. She states that her one meal a day comes at dinner time and usually is a crockpot dinner that includes meat and one vegetable. Lavaeh is eating one meal per day. does not have much of a appetite. She knows she needs to do better but doesnt feel like eating. Encouraged small snack and picking nutrient dense foods Jolie is still eating one meal day. Reviewed importance of eating more meals and snacks throughout the day.     Expected Outcome Short: Try adding one more nutrient dense snack each day. Long: Continue to practice heart healthy eating patterns discussed with RD. SGT: Eat 2 to 3 times per day LTG: Continue to practice heart healthy eating patterns discussed with RD. SGT: Eat 2 to 3 times per day LTG: Continue to practice heart healthy eating patterns discussed with RD.         Nutrition Goals Discharge (Final Nutrition Goals Re-Evaluation):  Nutrition Goals Re-Evaluation - 08/31/24 1116       Goals   Comment Crystal is still eating one meal day. Reviewed importance of eating more meals and snacks throughout the day.    Expected Outcome SGT: Eat 2 to 3 times per day LTG: Continue to practice heart healthy eating patterns discussed with RD.          Psychosocial: Target Goals: Acknowledge presence or absence of significant depression and/or stress, maximize coping skills,  provide positive support system. Participant is able to verbalize types and ability to use techniques and skills needed for reducing stress and depression.   Education: Stress, Anxiety, and Depression - Group verbal and visual presentation to define topics covered.  Reviews how body is impacted by stress, anxiety, and depression.  Also discusses healthy ways to reduce stress and to treat/manage anxiety and depression.  Written material provided at class time. Flowsheet Row Pulmonary Rehab from 12/25/2022 in [facility] Cardiac and Pulmonary Rehab  Date 10/30/22  Educator [facility]  Instruction Review Code 1- [employer] Understanding    Education: Sleep Hygiene -Provides group verbal and written instruction about how sleep can affect your health.  Define sleep hygiene, discuss sleep cycles and impact of sleep habits. Review good sleep hygiene tips.    Initial Review & Psychosocial Screening:  Initial Psych Review & Screening - 04/27/24 1328       Initial Review   Current issues with None Identified      Family Dynamics   Good Support System? Yes      Barriers   Psychosocial barriers to participate in program There are no identifiable barriers or psychosocial needs.      Screening Interventions   Interventions Encouraged to exercise;To provide support and resources with identified psychosocial needs;Provide feedback about the scores to participant    Expected Outcomes Short Term goal:  Utilizing psychosocial counselor, staff and physician to assist with identification of specific Stressors or current issues interfering with healing process. Setting desired goal for each stressor or current issue identified.;Long Term Goal: Stressors or current issues are controlled or eliminated.;Short Term goal: Identification and review with participant of any Quality of Life or Depression concerns found by scoring the questionnaire.;Long Term goal: The participant improves quality of Life and PHQ9 Scores as seen by post scores and/or verbalization of changes          Quality of Life Scores:  Scores of 19 and below usually indicate a poorer quality of life in these areas.  A difference of  2-3 points is a clinically meaningful difference.  A difference of 2-3 points in the total score of the Quality of Life Index has been associated with significant improvement in overall quality of life, self-image, physical symptoms, and general health in studies assessing change in quality of life.  PHQ-9: Review Flowsheet  More data exists      10/05/2024 08/31/2024 07/27/2024 07/08/2024 04/28/2024  Depression screen PHQ 2/9  Decreased Interest 0 3 3 3 3   Down, Depressed, Hopeless 0 1 1 0 3  PHQ - 2 Score 0 4 4 3 6   Altered sleeping 3 3 3 3 3   Tired, decreased energy 3 3 3 3 3   Change in appetite 3 3 3 3 3   Feeling bad or failure about yourself  3 3 3 3 3   Trouble concentrating 3 3 3 3 3   Moving slowly or fidgety/restless 3 3 3 3 3   Suicidal thoughts 0 0 0 0 0  PHQ-9 Score 18 22  22  21  24    Difficult doing work/chores Very difficult Very difficult Extremely dIfficult Extremely dIfficult Very difficult    Details       Data saved with a previous flowsheet row definition        Interpretation of Total Score  Total Score Depression Severity:  1-4 = Minimal depression, 5-9 = Mild depression, 10-14 = Moderate depression, 15-19 = Moderately severe depression, 20-27 = Severe depression    Psychosocial Evaluation  and Intervention:  Psychosocial Evaluation - 04/27/24 1329       Psychosocial Evaluation & Interventions   Interventions Encouraged to exercise with the program and follow exercise prescription    Comments Anaiza is coming to pulmonary rehab with Emphysema. She has done the program before but was unable to complete it. She states she could tell that it made a difference before, so she is willing to try again to help her quality of life. Her medications are going well. She mentions needed a sleep study and is waiting for that to be scheduled. She states she doesn't have any concerns and just takes it day by day with her health.    Expected Outcomes Short: attend pulmonary rehab for education and exercise Long: develop and maintain positive self care habits    Continue Psychosocial Services  Follow up required by staff          Psychosocial Re-Evaluation:  Psychosocial Re-Evaluation     Row Name 07/08/24 1118 07/27/24 1114 08/31/24 1112         Psychosocial Re-Evaluation   Current issues with Current Depression;Current Anxiety/Panic;Current Sleep Concerns;Current Stress Concerns Current Sleep Concerns;Current Stress Concerns;Current Anxiety/Panic Current Sleep Concerns;Current Anxiety/Panic;Current Stress Concerns     Comments Delphia states that the majority of her stress comes from her health concerns and taking care of her family. She reports her stress has caused her not to be able to sleep very well and caused a loss of appetite. We reviewed her PHQ-9 questionnaire today and her score improved some but was still high. She states that she does not really have hobbies to help her relieve stress but does have her faith in the [place]. Brenlyn reports she is experiencing stress from health and family. She has anxiety that impacts her sleep. She only gets 4hrs of sleep. She says she does not have coping mechanisms. She says she would prefer to keep these things to herself.  Encouraged her to continue to exercise as a way to relief stress and improve mental mood. Lysa struggles with sleep, only getting around 3 hours per night. She struggles with anxiety, depression or stress daily. She does not like to talk about it with others.     Expected Outcomes Short: Talk to doctor about sleep and anxiety. Long: Continue to exercise for mental boost. Short: Talk to doctor about sleep and anxiety. Long: Continue to exercise for mental boost. STG: continue to attend rehab. LTG: continue to exercise for mental boost     Interventions Encouraged to attend Pulmonary Rehabilitation for the exercise Encouraged to attend Pulmonary Rehabilitation for the exercise Encouraged to attend Pulmonary Rehabilitation for the exercise     Continue Psychosocial Services  Follow up required by staff Follow up required by staff Follow up required by staff        Psychosocial Discharge (Final Psychosocial Re-Evaluation):  Psychosocial Re-Evaluation - 08/31/24 1112       Psychosocial Re-Evaluation   Current issues with Current Sleep Concerns;Current Anxiety/Panic;Current Stress Concerns    Comments Vaneta struggles with sleep, only getting around 3 hours per night. She struggles with anxiety, depression or stress daily. She does not like to talk about it with others.    Expected Outcomes STG: continue to attend rehab. LTG: continue to exercise for mental boost    Interventions Encouraged to attend Pulmonary Rehabilitation for the exercise    Continue Psychosocial Services  Follow up required by staff          Education: Education  Goals: Education classes will be provided on a weekly basis, covering required topics. Participant will state understanding/return demonstration of topics presented.  Learning Barriers/Preferences:  Learning Barriers/Preferences - 04/27/24 1307       Learning Barriers/Preferences   Learning Barriers None    Learning Preferences None          General  Pulmonary Education Topics:  Infection Prevention: - Provides verbal and written material to individual with discussion of infection control including proper hand washing and proper equipment cleaning during exercise session. Flowsheet Row Pulmonary Rehab from 04/28/2024 in [facility] Cardiac and Pulmonary Rehab  Date 04/28/24  Educator MB  Instruction Review Code 1- Verbalizes Understanding    Falls Prevention: - Provides verbal and written material to individual with discussion of falls prevention and safety. Flowsheet Row Pulmonary Rehab from 04/28/2024 in [facility] Cardiac and Pulmonary Rehab  Date 04/28/24  Educator MB  Instruction Review Code 1- Verbalizes Understanding    Chronic Lung Disease Review: - Group verbal instruction with posters, models, PowerPoint presentations and videos,  to review new updates, new respiratory medications, new advancements in procedures and treatments. Providing information on websites and 800 numbers for continued self-education. Includes information about supplement oxygen, available portable oxygen systems, continuous and intermittent flow rates, oxygen safety, concentrators, and Medicare reimbursement for oxygen. Explanation of Pulmonary Drugs, including class, frequency, complications, importance of spacers, rinsing mouth after steroid MDI's, and proper cleaning methods for nebulizers. Review of basic lung anatomy and physiology related to function, structure, and complications of lung disease. Review of risk factors. Discussion about methods for diagnosing sleep apnea and types of masks and machines for OSA. Includes a review of the use of types of environmental controls: home humidity, furnaces, filters, dust mite/pet prevention, HEPA vacuums. Discussion about weather changes, air quality and the benefits of nasal washing. Instruction on Warning signs, infection symptoms, calling MD promptly, preventive modes, and value of vaccinations. Review of effective airway  clearance, coughing and/or vibration techniques. Emphasizing that all should Create an Action Plan. Written material provided at class time. Flowsheet Row Pulmonary Rehab from 12/25/2022 in [facility] Cardiac and Pulmonary Rehab  Education need identified 09/30/22    AED/CPR: - Group verbal and written instruction with the use of models to demonstrate the basic use of the AED with the basic ABC's of resuscitation.    Tests and Procedures:  - Group verbal and visual presentation and models provide information about basic cardiac anatomy and function. Reviews the testing methods done to diagnose heart disease and the outcomes of the test results. Describes the treatment choices: Medical Management, Angioplasty, or Coronary Bypass Surgery for treating various heart conditions including Myocardial Infarction, Angina, Valve Disease, and Cardiac Arrhythmias.  Written material provided at class time. Flowsheet Row Pulmonary Rehab from 12/25/2022 in [facility] Cardiac and Pulmonary Rehab  Date 12/04/22  Educator SB  Instruction Review Code 1- Verbalizes Understanding    Medication Safety: - Group verbal and visual instruction to review commonly prescribed medications for heart and lung disease. Reviews the medication, class of the drug, and side effects. Includes the steps to properly store meds and maintain the prescription regimen.  Written material given at graduation. Flowsheet Row Pulmonary Rehab from 12/25/2022 in [facility] Cardiac and Pulmonary Rehab  Date 10/09/22  Educator SB  Instruction Review Code 1- Verbalizes Understanding    Other: -Provides group and verbal instruction on various topics (see comments)   Knowledge Questionnaire Score:  Knowledge Questionnaire Score - 09/07/24 1108  Knowledge Questionnaire Score   Pre Score 16/18    Post Score 12/18           Core Components/Risk Factors/Patient Goals at Admission:  Personal Goals and Risk Factors at Admission - 04/28/24 1548        Core Components/Risk Factors/Patient Goals on Admission    Weight Management Yes;Weight Loss    Intervention Weight Management: Develop a combined nutrition and exercise program designed to reach desired caloric intake, while maintaining appropriate intake of nutrient and fiber, sodium and fats, and appropriate energy expenditure required for the weight goal.;Weight Management: Provide education and appropriate resources to help participant work on and attain dietary goals.;Weight Management/Obesity: Establish reasonable short term and long term weight goals.    Admit Weight 165 lb 12.8 oz (75.2 kg)    Goal Weight: Short Term 147 lb 8 oz (66.9 kg)    Goal Weight: Long Term 130 lb (59 kg)    Expected Outcomes Short Term: Continue to assess and modify interventions until short term weight is achieved;Long Term: Adherence to nutrition and physical activity/exercise program aimed toward attainment of established weight goal;Weight Loss: Understanding of general recommendations for a balanced deficit meal plan, which promotes 1-2 lb weight loss per week and includes a negative energy balance of [phone] kcal/d;Understanding recommendations for meals to include 15-35% energy as protein, 25-35% energy from fat, 35-60% energy from carbohydrates, less than 200mg  of dietary cholesterol, 20-35 gm of total fiber daily;Understanding of distribution of calorie intake throughout the day with the consumption of 4-5 meals/snacks    Improve shortness of breath with ADL's Yes    Intervention Provide education, individualized exercise plan and daily activity instruction to help decrease symptoms of SOB with activities of daily living.    Expected Outcomes Short Term: Improve cardiorespiratory fitness to achieve a reduction of symptoms when performing ADLs;Long Term: Be able to perform more ADLs without symptoms or delay the onset of symptoms    Increase knowledge of respiratory medications and ability to use respiratory  devices properly  Yes    Intervention Provide education and demonstration as needed of appropriate use of medications, inhalers, and oxygen therapy.    Expected Outcomes Short Term: Achieves understanding of medications use. Understands that oxygen is a medication prescribed by physician. Demonstrates appropriate use of inhaler and oxygen therapy.;Long Term: Maintain appropriate use of medications, inhalers, and oxygen therapy.    Hypertension Yes    Intervention Provide education on lifestyle modifcations including regular physical activity/exercise, weight management, moderate sodium restriction and increased consumption of fresh fruit, vegetables, and low fat dairy, alcohol moderation, and smoking cessation.;Monitor prescription use compliance.    Expected Outcomes Short Term: Continued assessment and intervention until BP is < 140/55mm HG in hypertensive participants. < 130/48mm HG in hypertensive participants with diabetes, heart failure or chronic kidney disease.;Long Term: Maintenance of blood pressure at goal levels.          Education:Diabetes - Individual verbal and written instruction to review signs/symptoms of diabetes, desired ranges of glucose level fasting, after meals and with exercise. Acknowledge that pre and post exercise glucose checks will be done for 3 sessions at entry of program. Flowsheet Row Pulmonary Rehab from 12/25/2022 in [facility] Cardiac and Pulmonary Rehab  Date 09/30/22  Educator [facility]  Instruction Review Code 1- Verbalizes Understanding    Know Your Numbers and Heart Failure: - Group verbal and visual instruction to discuss disease risk factors for cardiac and pulmonary disease and treatment options.  Reviews associated critical  values for Overweight/Obesity, Hypertension, Cholesterol, and Diabetes.  Discusses basics of heart failure: signs/symptoms and treatments.  Introduces Heart Failure Zone chart for action plan for heart failure. Written material provided at class  time. Flowsheet Row Pulmonary Rehab from 12/25/2022 in [facility] Cardiac and Pulmonary Rehab  Date 12/25/22  Educator [facility]  Instruction Review Code 1- Verbalizes Understanding    Core Components/Risk Factors/Patient Goals Review:   Goals and Risk Factor Review     Row Name 07/08/24 1112 07/27/24 1120 08/31/24 1117         Core Components/Risk Factors/Patient Goals Review   Personal Goals Review Weight Management/Obesity;Improve shortness of breath with ADL's;Hypertension Improve shortness of breath with ADL's Improve shortness of breath with ADL's     Review Virgen states that she would like to lose some weight with a goal of getting down to 115 lb. She weighed in today at 162.7 lb. She is only eating one meal a day and does not understand why she is not losing weight. She states that she was able to breath better when she was previously at a lower weight. She has been checking her BP at home with her personal BP cuff but reports that her readings have been low. She states that her systolic readings are usually in the 90's and her diastolic readings have been in the 50's. We encouraged her to speak with her doctor about her low BP readings. She reports continuing to take all of her medications as prescribed. Candy reports she has seen improvement in breathing since starting the program. Encouraged her to continue to exercise at home and independently of rehab to keep breathing improvement Elaysia reports she has seen some improvements in breathing during ADLs, but mostly feels the rehab has helped prevent it from becoming worse.     Expected Outcomes Short: Talk to doctor about low BP readings. Long: Conitnue to manage lifestyle risk factors. Short: continue to exercise post at home. Long: Conitnue to manage lifestyle risk factors. STG: Continue to exercise at home and independently. LTG: Continue to manage lifestyle risk factors.        Core Components/Risk Factors/Patient Goals at Discharge (Final  Review):   Goals and Risk Factor Review - 08/31/24 1117       Core Components/Risk Factors/Patient Goals Review   Personal Goals Review Improve shortness of breath with ADL's    Review Lateesha reports she has seen some improvements in breathing during ADLs, but mostly feels the rehab has helped prevent it from becoming worse.    Expected Outcomes STG: Continue to exercise at home and independently. LTG: Continue to manage lifestyle risk factors.          ITP Comments:  ITP Comments     Row Name 04/27/24 1326 04/28/24 1537 05/04/24 1053 05/26/24 0942 06/23/24 0941   ITP Comments Initial phone call completed. Diagnosis can be found in [facility] 6/12. EP Orientation scheduled for Wednesday 7/2 at 1:30. Completed and gym orientation for respiratory care services. Initial ITP created and sent for review to Dr. Faud Aleskerov, Medical Director. First full day of exercise!  Patient was oriented to gym and equipment including functions, settings, policies, and procedures.  Patient's individual exercise prescription and treatment plan were reviewed.  All starting workloads were established based on the results of the 6 minute walk test done at initial orientation visit.  The plan for exercise progression was also introduced and progression will be customized based on patient's performance and goals. 30 Day review completed.  Medical Director ITP review done, changes made as directed, and signed approval by Medical Director. new to program. 30 Day review completed. Medical Director ITP review done; changes made as directed and signed approval by Medical Director.    Row Name 07/21/24 1406 08/18/24 1029 09/15/24 1143       ITP Comments 30 Day review completed. Medical Director ITP review done; changes made as directed and signed approval by Medical Director. 30 Day review completed. Medical Director ITP review done; changes made as directed and signed approval by Medical Director. 30 Day review completed.  Medical Director ITP review done, changes made as directed, and signed approval by Medical Director.        Comments: discharge ITP

## 2024-10-05 NOTE — Progress Notes (Signed)
 Daily Session Note  Patient Details  Name: Lauren Escobar MRN: [id number] Date of Birth: [birth year] Referring Provider:   Flowsheet Row Pulmonary Rehab from 04/28/2024 in [facility] Cardiac and Pulmonary Rehab  Referring Provider [name] MD    Encounter Date: 10/05/2024  Check In:  Session Check In - 10/05/24 1100       Check-In   Supervising physician immediately available to respond to emergencies See telemetry face sheet for immediately available ER MD    Location [facility]-Cardiac & Pulmonary Rehab    Staff Present Burnard Davenport RN,BSN,MPA;Maxon Conetta BS, Exercise Physiologist;Meredith Tressa RN,BSN;Noah Tickle, BS, Exercise Physiologist    Virtual Visit No    Medication changes reported     No    Fall or balance concerns reported    No    Tobacco Cessation No Change    Warm-up and Cool-down Performed on first and last piece of equipment    Resistance Training Performed Yes    VAD Patient? No    PAD/SET Patient? No      Pain Assessment   Currently in Pain? No/denies             Social History   Tobacco Use  Smoking Status Former   Current packs/day: 0.00   Average packs/day: 2.8 packs/day for 29.0 years (79.8 ttl pk-yrs)   Types: Cigarettes   Start date: 10/1991   Quit date: 10/2020   Years since quitting: 3.9  Smokeless Tobacco Never    Goals Met:  Proper associated with RPD/PD & O2 Sat Independence with exercise equipment Using PLB without cueing & demonstrates good technique Exercise tolerated well No report of concerns or symptoms today Strength training completed today  Goals Unmet:  Not Applicable  Comments:  Sharlena graduated today from  rehab with 36 sessions completed.  Details of the patient's exercise prescription and what She needs to do in order to continue the prescription and progress were discussed with patient.  Patient was given a copy of prescription and goals.  Patient verbalized understanding. [name] plans to continue to exercise  by walking at home.    Dr. Oneil Pinal is Medical Director for [facility] Cardiac Rehabilitation.  Dr. Fuad Aleskerov is Medical Director for [facility] Pulmonary Rehabilitation.

## 2024-10-05 NOTE — Progress Notes (Signed)
 Discharge Summary Patient: Lauren Escobar DOB: [birth year]  Consuelo graduated today from  rehab with 36 sessions completed.  Details of the patient's exercise prescription and what She needs to do in order to continue the prescription and progress were discussed with patient.  Patient was given a copy of prescription and goals.  Patient verbalized understanding. Edan plans to continue to exercise by walking at home.   6 Minute Walk     Row Name 04/28/24 1538 09/02/24 1118       6 Minute Walk   Phase Initial Discharge    Distance 1170 feet 1300 feet    Distance % Change -- 11 %    Distance Feet Change -- 130 ft    Walk Time 6 minutes 6 minutes    # of Rest Breaks 0 0    MPH 2.2 2.46    METS 3.13 3.39    RPE 19 13    Perceived Dyspnea  2 1    VO2 Peak -- 11.88    Symptoms Yes (comment) No    Comments Chest pain 9/10 and resolved to a 7/10 at rest --    Resting HR 93 bpm 97 bpm    Resting BP 100/70 104/60    Resting Oxygen Saturation  96 % 96 %    Exercise Oxygen Saturation  during 6 min walk 91 % 96 %    Max Ex. HR 117 bpm 114 bpm    Max Ex. BP 120/78 128/60    2 Minute Post BP 92/70 104/62      Interval HR   1 Minute HR 100 108    2 Minute HR 117 114    3 Minute HR 112 107    4 Minute HR 110 109    5 Minute HR 87 111    6 Minute HR 95 114    2 Minute Post HR 91 95    Interval Heart Rate? Yes Yes      Interval Oxygen   Interval Oxygen? Yes Yes    Baseline Oxygen Saturation % 96 % 96 %    1 Minute Oxygen Saturation % 91 % 96 %    1 Minute Liters of Oxygen 0 L 0 L    2 Minute Oxygen Saturation % 91 % 96 %    2 Minute Liters of Oxygen 0 L 0 L    3 Minute Oxygen Saturation % 94 % 96 %    3 Minute Liters of Oxygen 0 L 0 L    4 Minute Oxygen Saturation % 94 % 97 %    4 Minute Liters of Oxygen 0 L 0 L    5 Minute Oxygen Saturation % 94 % 97 %    5 Minute Liters of Oxygen 0 L 0 L    6 Minute Oxygen Saturation % 93 % 98 %    6 Minute Liters of Oxygen 0 L 0 L    2  Minute Post Oxygen Saturation % 98 % 98 %    2 Minute Post Liters of Oxygen 0 L 0 L

## 2024-10-05 NOTE — Telephone Encounter (Signed)
 Received [employer]  new start paperwork. Completed form and faxed with clinicals and insurance card copy to [facility]   Phone#: [phone] Fax#: [phone]

## 2024-10-06 NOTE — Telephone Encounter (Signed)
 Received fax from [employer] confirming receipt of enrollment form.  Patient ID: [id number]

## 2024-10-07 ENCOUNTER — Ambulatory Visit
Admission: RE | Admit: 2024-10-07 | Discharge: 2024-10-07 | Disposition: A | Discharge status: Home / Self Care | Source: Ambulatory Visit | Attending: Cardiology | Admitting: Cardiology

## 2024-10-07 ENCOUNTER — Ambulatory Visit

## 2024-10-07 DIAGNOSIS — J42 Unspecified chronic bronchitis: Secondary | ICD-10-CM

## 2024-10-07 DIAGNOSIS — R079 Chest pain, unspecified: Secondary | ICD-10-CM | POA: Diagnosis present

## 2024-10-07 LAB — PULMONARY FUNCTION TEST
DL/VA % pred: 114 %
DL/VA: 5.05 ml/min/mmHg/L
DLCO unc % pred: 103 %
DLCO unc: 17.48 ml/min/mmHg
FEF 25-75 Post: 2.88 L/s
FEF 25-75 Pre: 2.52 L/s
FEF2575-%Change-Post: 14 %
FEF2575-%Pred-Post: 136 %
FEF2575-%Pred-Pre: 119 %
FEV1-%Change-Post: 1 %
FEV1-%Pred-Post: 99 %
FEV1-%Pred-Pre: 98 %
FEV1-Post: 2.11 L
FEV1-Pre: 2.09 L
FEV1FVC-%Change-Post: 4 %
FEV1FVC-%Pred-Pre: 105 %
FEV6-%Change-Post: -3 %
FEV6-%Pred-Post: 91 %
FEV6-%Pred-Pre: 94 %
FEV6-Post: 2.43 L
FEV6-Pre: 2.51 L
FEV6FVC-%Change-Post: 0 %
FEV6FVC-%Pred-Post: 103 %
FEV6FVC-%Pred-Pre: 103 %
FVC-%Change-Post: -3 %
FVC-%Pred-Post: 88 %
FVC-%Pred-Pre: 91 %
FVC-Post: 2.43 L
FVC-Pre: 2.52 L
Post FEV1/FVC ratio: 87 %
Post FEV6/FVC ratio: 100 %
Pre FEV1/FVC ratio: 83 %
Pre FEV6/FVC Ratio: 100 %
RV % pred: 83 %
RV: 1.43 L
TLC % pred: 92 %
TLC: 4 L

## 2024-10-07 MED ORDER — TECHNETIUM TC 99M TETROFOSMIN IV KIT
10.0900 | PACK | Freq: Once | INTRAVENOUS | Status: AC | PRN
  Administered 2024-10-07: 10.09 via INTRAVENOUS

## 2024-10-07 MED ORDER — TECHNETIUM TC 99M TETROFOSMIN IV KIT
31.7900 | PACK | Freq: Once | INTRAVENOUS | Status: AC | PRN
  Administered 2024-10-07: 31.79 via INTRAVENOUS

## 2024-10-07 MED ORDER — REGADENOSON 0.4 MG/5ML IV SOLN
0.4000 mg | Freq: Once | INTRAVENOUS | Status: AC
  Administered 2024-10-07: 0.4 mg via INTRAVENOUS
  Filled 2024-10-07: qty 5

## 2024-10-07 NOTE — Progress Notes (Signed)
 Full PFT completed today ? ?

## 2024-10-07 NOTE — Patient Instructions (Signed)
 Full PFT completed today ? ?

## 2024-10-08 ENCOUNTER — Other Ambulatory Visit (HOSPITAL_COMMUNITY)

## 2024-10-08 LAB — NM MYOCAR MULTI W/SPECT W/WALL MOTION / EF
LV dias vol: 56 mL (ref 46–106)
LV sys vol: 20 mL (ref 3.8–5.2)
Nuc Stress EF: 64 %
Peak HR: 121 {beats}/min
Percent HR: 75 %
Rest HR: 96 {beats}/min
Rest Nuclear Isotope Dose: 10.1 mCi
SDS: 0
SRS: 2
SSS: 0
ST Depression (mm): 0 mm
Stress Nuclear Isotope Dose: 31.8 mCi
TID: 0.91

## 2024-10-08 NOTE — Telephone Encounter (Signed)
 Received fax from [employer] with summary of benefits. Referral form for [employer]  received. Rx will be triaged to [facility] (phone: [phone]). Once benefits investigation completed, pharmacy will reach out the patient to schedule shipment. If medication is unaffordable, patient will need to express financial hardship to be referred back to [facility] for patient assistance program pre-screening.   Patient ID: [id number] Pharmacy phone: [facility] (phone: [phone]) [facility] Phone#: [phone]

## 2024-10-08 NOTE — Telephone Encounter (Signed)
 Received fax from [facility] confirming receipt of rx.

## 2024-10-19 ENCOUNTER — Ambulatory Visit: Payer: Self-pay | Admitting: Cardiology

## 2024-10-25 MED FILL — HYDROXYUREA 500 MG CAPSULE: ORAL | 30 days supply | Qty: 60 | Fill #2

## 2024-11-02 ENCOUNTER — Ambulatory Visit
Admit: 2024-11-02 | Discharge: 2024-11-02 | Payer: Medicare (Managed Care) | Attending: Hematology & Oncology | Primary: Hematology & Oncology

## 2024-11-02 ENCOUNTER — Other Ambulatory Visit: Admit: 2024-11-02 | Discharge: 2024-11-02 | Payer: Medicare (Managed Care)

## 2024-11-02 DIAGNOSIS — E611 Iron deficiency: Principal | ICD-10-CM

## 2024-11-02 DIAGNOSIS — D45 Polycythemia vera: Principal | ICD-10-CM

## 2024-11-02 DIAGNOSIS — H8110 Benign paroxysmal vertigo, unspecified ear: Principal | ICD-10-CM

## 2024-11-02 DIAGNOSIS — R110 Nausea: Principal | ICD-10-CM

## 2024-11-02 MED ORDER — SILVER SULFADIAZINE 1 % TOPICAL CREAM
TOPICAL | 1 refills | 0.00000 days | Status: CP
Start: 2024-11-02 — End: 2025-11-02

## 2024-11-05 DIAGNOSIS — R2689 Other abnormalities of gait and mobility: Principal | ICD-10-CM

## 2024-11-05 DIAGNOSIS — G4733 Obstructive sleep apnea (adult) (pediatric): Principal | ICD-10-CM

## 2024-11-05 DIAGNOSIS — J4520 Mild intermittent asthma, uncomplicated: Principal | ICD-10-CM

## 2024-11-05 DIAGNOSIS — D45 Polycythemia vera: Principal | ICD-10-CM

## 2024-11-17 ENCOUNTER — Encounter: Payer: Self-pay | Admitting: Oncology

## 2024-11-17 ENCOUNTER — Inpatient Hospital Stay: Attending: Oncology | Admitting: Oncology

## 2024-11-17 ENCOUNTER — Inpatient Hospital Stay

## 2024-11-17 VITALS — BP 114/79 | HR 103 | Temp 96.7°F | Resp 18 | Wt 158.6 lb

## 2024-11-17 DIAGNOSIS — D45 Polycythemia vera: Secondary | ICD-10-CM | POA: Diagnosis not present

## 2024-11-17 DIAGNOSIS — Z7964 Long term (current) use of myelosuppressive agent: Secondary | ICD-10-CM | POA: Insufficient documentation

## 2024-11-17 DIAGNOSIS — Z7982 Long term (current) use of aspirin: Secondary | ICD-10-CM | POA: Diagnosis not present

## 2024-11-17 DIAGNOSIS — Z803 Family history of malignant neoplasm of breast: Secondary | ICD-10-CM | POA: Insufficient documentation

## 2024-11-17 DIAGNOSIS — D6800 Von Willebrand disease, unspecified: Secondary | ICD-10-CM | POA: Insufficient documentation

## 2024-11-17 DIAGNOSIS — Z87891 Personal history of nicotine dependence: Secondary | ICD-10-CM | POA: Diagnosis not present

## 2024-11-17 DIAGNOSIS — R911 Solitary pulmonary nodule: Secondary | ICD-10-CM | POA: Insufficient documentation

## 2024-11-17 DIAGNOSIS — R42 Dizziness and giddiness: Secondary | ICD-10-CM | POA: Insufficient documentation

## 2024-11-17 DIAGNOSIS — Z8052 Family history of malignant neoplasm of bladder: Secondary | ICD-10-CM | POA: Diagnosis not present

## 2024-11-17 DIAGNOSIS — Z8042 Family history of malignant neoplasm of prostate: Secondary | ICD-10-CM | POA: Insufficient documentation

## 2024-11-17 MED ORDER — HYDROXYUREA 500 MG PO CAPS: 500.0000 mg | ORAL_CAPSULE | Freq: Two times a day (BID) | ORAL | 5 refills | Status: AC

## 2024-11-17 NOTE — Assessment & Plan Note (Signed)
 Previously responded to DDAVP . 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 Check von Willebrand panel at the next visit.

## 2024-11-17 NOTE — Assessment & Plan Note (Signed)
 Recommend 6 months short-term follow-up with CT scan. -April 2026

## 2024-11-17 NOTE — Assessment & Plan Note (Addendum)
 Currently on hydroxyurea  500 mg daily.  Overall she tolerates well.  Continue current regimen.  Refills were sent. Recommend dermatology evaluation due to the increased skin cancer risk from hydroxyurea . Goal of hematocrit <45. Aspirin  81mg  daily.

## 2024-11-17 NOTE — Assessment & Plan Note (Signed)
 Symptoms suggest positional vertigo.  Recommend ENT evaluation.

## 2024-11-17 NOTE — Progress Notes (Signed)
 " Hematology/Oncology Consult note [phone] [phone]        REFERRING PROVIDER: Fleeta Ebbing, Meridee Aloe, MD   CHIEF COMPLAINTS/REASON FOR VISIT:  Evaluation of polycythemia vera, von Willebrand disease, lung nodule, dizziness   ASSESSMENT & PLAN:   Polycythemia vera (HCC) Currently on hydroxyurea  500 mg daily.  Overall she tolerates well.  Continue current regimen.  Refills were sent. Recommend dermatology evaluation due to the increased skin cancer risk from hydroxyurea . Goal of hematocrit <45. Aspirin  81mg  daily.    Von Willebrand's disease [facility]) Previously responded to DDAVP . 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 Check von Willebrand panel at the next visit.  Lung nodule Recommend 6 months short-term follow-up with CT scan. -April 2026  Dizziness Symptoms suggest positional vertigo.  Recommend ENT evaluation.   Orders Placed This Encounter  Procedures   CBC with Differential (Cancer Center Only)    Standing Status:   Future    Expected Date:   02/15/2025    Expiration Date:   05/16/2025   CMP (Cancer Center only)    Standing Status:   Future    Expected Date:   02/15/2025    Expiration Date:   05/16/2025    All questions were answered. The patient knows to call the clinic with any problems, questions or concerns.  Zelphia Cap, MD, PhD [facility] Hematology Oncology 11/17/2024   HISTORY OF PRESENTING ILLNESS:   Lauren Escobar is a  59 y.o.  female with PMH listed below was seen in consultation at the request of  Fleeta Ebbing, Meridee Aloe, MD  for evaluation of polycythemia Olena and Von Willabrand disease.   Patient's previous oncology care was at [facility].  Extensive medical record review was performed by me.    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  She has had excellent control of her counts however developed weight gain.  Switched back to hydroxyurea  500 mg daily in 2020.  She tolerates well. CT scan from 2014 physical 15 showed normal spleen and liver size.  Continues to have chronic fatigue. sun sensitivity,    # Von Willebrand disease At one point she was found to have a low factor VIII level of 20% [result is not available in current EMR, per note von Willebrand's factor activity and antigen levels as low as 20% with correspondingly low factory VIII levels]   . Retesting in 2005 at [facility] showed normal platelet function and normal levels of factor VIII as well as von Willebrand's factor and von Willebrand's activity.  Previous hematologist Dr. Georgina recommend patient to receive DDAVP  for small procedures, and factor VIII (Humate-P) for major procedures. she has had surgeries including hysterectomy with factor support, without significant bleeding   # Psychiatric disease, stated history of major depression; previously treated with Abilify , Wellbutrin , BuSpar , Adderall, Valium ;  Latuda , gabapentin , Topamax , Viibryd , txed by [facility]    # lung nodule  08/04/2024 CT scan of the chest showed 5.4 mm lateral left lower lobe nodule lung RADS 3.  Probably benign.  Short-term follow-up 6 months is recommended.  Enlarged pulmonary trunk.  Aortic atherosclerosis.  Emphysema.  # Dizziness when she lies down, suspect positional vertigo.  She has appointment with ENT.  MEDICAL HISTORY:  Past Medical History:  Diagnosis Date  Abdominal pain    Abnormal mammogram    Anxiety    Asthma    Bleeding disorder    Blood dyscrasia    polycythemia vera   Chronic kidney disease    COPD (chronic obstructive pulmonary disease) (HCC)    Cough    Depression    Diabetes mellitus without complication (HCC)    Fatigue    Headache 2000   Horizontal nystagmus     age 78   Hyperglycemia    Hypertension    Low back pain    Major depressive disorder    Nasal lesion    Neck pain    Snoring    SOB (shortness of breath)    Tobacco use    UTI (urinary tract infection)    Von Willebrand's disease (HCC)     SURGICAL HISTORY: Past Surgical History:  Procedure Laterality Date   ABDOMINAL HYSTERECTOMY  2002   oophorectomy   ABDOMINAL HYSTERECTOMY     APPENDECTOMY     BLADDER SURGERY     BREAST BIOPSY Left    NEG   CARDIAC CATHETERIZATION     CARDIAC CATHETERIZATION N/A 07/17/2016   Procedure: Left Heart Cath and Coronary Angiography;  Surgeon: Wolm JINNY Rhyme, MD;  Location: [facility] INVASIVE CV LAB;  Service: Cardiovascular;  Laterality: N/A;   COLONOSCOPY WITH PROPOFOL  N/A 07/27/2015   Procedure: COLONOSCOPY WITH PROPOFOL ;  Surgeon: Lamar ONEIDA Holmes, MD;  Location: [facility] ENDOSCOPY;  Service: Endoscopy;  Laterality: N/A;   COLONOSCOPY WITH PROPOFOL  N/A 05/31/2019   Procedure: COLONOSCOPY WITH PROPOFOL ;  Surgeon: Unk Corinn Skiff, MD;  Location: [facility] ENDOSCOPY;  Service: Gastroenterology;  Laterality: N/A;   ESOPHAGOGASTRODUODENOSCOPY N/A 06/18/2017   Procedure: ESOPHAGOGASTRODUODENOSCOPY (EGD);  Surgeon: Unk Corinn Skiff, MD;  Location: [facility] ENDOSCOPY;  Service: Gastroenterology;  Laterality: N/A;   ESOPHAGOGASTRODUODENOSCOPY (EGD) WITH PROPOFOL  N/A 05/31/2019   Procedure: ESOPHAGOGASTRODUODENOSCOPY (EGD) WITH PROPOFOL ;  Surgeon: Unk Corinn Skiff, MD;  Location: [facility] ENDOSCOPY;  Service: Gastroenterology;  Laterality: N/A;   REPLACEMENT TOTAL KNEE Left    SHOULDER SURGERY Left    x 2    SOCIAL HISTORY: Social History   Socioeconomic History   Marital status: Single    Spouse name: Not on file   Number of children: Not on file   Years of education: Not on file   Highest education level: Not on file  Occupational History    Comment: disabled  Tobacco Use   Smoking status: Former    Current packs/day: 0.00    Average packs/day: 2.8  packs/day for 29.0 years (79.8 ttl pk-yrs)    Types: Cigarettes    Start date: 10/1991    Quit date: 10/2020    Years since quitting: 4.0   Smokeless tobacco: Never  Vaping Use   Vaping status: Never Used  Substance and Sexual Activity   Alcohol use: No    Alcohol/week: 0.0 standard drinks of alcohol   Drug use: No   Sexual activity: Not on file  Other Topics Concern   Not on file  Social History Narrative   single   Social Drivers of Health   Tobacco Use: Medium Risk (11/17/2024)   Patient History    Smoking Tobacco Use: Former    Smokeless Tobacco Use: Never    Passive Exposure: Not on Actuary Strain: Not on file  Food Insecurity: No Food Insecurity (09/14/2024)   Epic    Worried About Radiation Protection Practitioner of Food in the Last Year: Never true  Ran Out of Food in the Last Year: Never true  Transportation Needs: No Transportation Needs (09/14/2024)   Epic    Lack of Transportation (Medical): No    Lack of Transportation (Non-Medical): No  Physical Activity: Not on file  Stress: Not on file  Social Connections: Moderately Isolated (04/13/2024)   Social Connection and Isolation Panel    Frequency of Communication with Friends and Family: Twice a week    Frequency of Social Gatherings with Friends and Family: Never    Attends Religious Services: 1 to 4 times per year    Active Member of [employer] or Organizations: No    Attends Banker Meetings: Never    Marital Status: Married  Catering Manager Violence: Not At Risk (09/14/2024)   Epic    Fear of Current or Ex-Partner: No    Emotionally Abused: No    Physically Abused: No    Sexually Abused: No  Depression (PHQ2-9): High Risk (10/05/2024)   Depression (PHQ2-9)    PHQ-2 Score: 18  Alcohol Screen: Not on file  Housing: Low Risk (09/14/2024)   Epic    Unable to Pay for Housing in the Last Year: No    Number of Times Moved in the Last Year: 0    Homeless in the Last Year: No  Utilities: Not At Risk  (09/15/2024)   Epic    Threatened with loss of utilities: No  Health Literacy: Not on file    FAMILY HISTORY: Family History  Problem Relation Age of Onset   Cancer Mother        bladder   Cancer Father        prostate   Stroke Father    Diabetes Father    Hypertension Sister    Heart disease Sister    Heart attack Maternal Grandmother    Breast cancer Maternal Grandmother     ALLERGIES:  is allergic to aspirin , pregabalin, trazodone, and tramadol.  MEDICATIONS:  Current Outpatient Medications  Medication Sig Dispense Refill   albuterol  (PROVENTIL ) (2.5 MG/3ML) 0.083% nebulizer solution Take 3 mLs (2.5 mg total) by nebulization every 6 (six) hours as needed for wheezing or shortness of breath. 75 mL 12   albuterol  (VENTOLIN  HFA) 108 (90 Base) MCG/ACT inhaler Inhale 2 puffs into the lungs every 6 (six) hours as needed for wheezing or shortness of breath. 18 g 2   aspirin  EC 81 MG tablet Take 81 mg by mouth daily.     atorvastatin  (LIPITOR) 20 MG tablet Take 20 mg by mouth at bedtime.     busPIRone  (BUSPAR ) 7.5 MG tablet Take 7.5 mg by mouth 3 (three) times daily.     cetirizine (ZYRTEC) 10 MG tablet Take 10 mg by mouth daily.     cyanocobalamin  (VITAMIN B12) 500 MCG tablet Take 1 tablet (500 mcg total) by mouth daily. 30 tablet 2   cyclobenzaprine  (FLEXERIL ) 10 MG tablet Take 10 mg by mouth at bedtime.     esomeprazole (NEXIUM) 40 MG capsule Take 40 mg by mouth daily.      Fluticasone -Umeclidin-Vilant (TRELEGY ELLIPTA ) 200-62.5-25 MCG/ACT AEPB Inhale 1 puff into the lungs daily. 60 each 11   folic acid  (FOLVITE ) 1 MG tablet Take 1 mg by mouth daily.     ipratropium-albuterol  (DUONEB) 0.5-2.5 (3) MG/3ML SOLN Take 3 mLs by nebulization every 6 (six) hours for 7 days. 90 mL 0   isosorbide  mononitrate (IMDUR ) 30 MG 24 hr tablet Take 30 mg by mouth daily.  Multiple Vitamins-Minerals (MULTIVIT/MULTIMINERAL ADULT PO) Take 1 tablet by mouth daily.     sertraline  (ZOLOFT ) 100 MG  tablet Take 100 mg by mouth daily.     Dextromethorphan -guaiFENesin  20-200 MG/20ML LIQD Take 20 mLs by mouth every 6 (six) hours as needed. (Patient not taking: Reported on 11/17/2024) 236 mL 0   Ensifentrine  3 MG/2.5ML SUSP Inhale 3 mg into the lungs in the morning and at bedtime. (Patient not taking: Reported on 11/17/2024)     hydroxyurea  (HYDREA ) 500 MG capsule Take 1 capsule (500 mg total) by mouth 2 (two) times daily. May take with food to minimize GI side effects. 60 capsule 5   propranolol  (INDERAL ) 40 MG tablet Take 40 mg by mouth 2 (two) times daily. (Patient not taking: Reported on 11/17/2024)     triamcinolone ointment (KENALOG) 0.1 % Apply 1 Application topically 2 (two) times daily as needed. (Patient not taking: Reported on 11/17/2024)     No current facility-administered medications for this visit.    Review of Systems  Constitutional:  Positive for fatigue. Negative for appetite change, chills and fever.  HENT:   Negative for hearing loss and voice change.   Eyes:  Negative for eye problems.  Respiratory:  Negative for chest tightness and cough.   Cardiovascular:  Negative for chest pain.  Gastrointestinal:  Negative for abdominal distention, abdominal pain and blood in stool.  Endocrine: Negative for hot flashes.  Genitourinary:  Negative for difficulty urinating and frequency.   Musculoskeletal:  Negative for arthralgias.  Skin:  Negative for itching and rash.  Neurological:  Positive for dizziness. Negative for extremity weakness.  Hematological:  Negative for adenopathy.  Psychiatric/Behavioral:  Negative for confusion.    PHYSICAL EXAMINATION:  Vitals:   11/17/24 1148  BP: 114/79  Pulse: (!) 103  Resp: 18  Temp: (!) 96.7 F (35.9 C)  SpO2: 99%   Filed Weights   11/17/24 1148  Weight: 158 lb 9.6 oz (71.9 kg)    Physical Exam Constitutional:      General: She is not in acute distress. HENT:     Head: Normocephalic and atraumatic.  Eyes:     General: No  scleral icterus. Cardiovascular:     Rate and Rhythm: Normal rate and regular rhythm.  Pulmonary:     Effort: Pulmonary effort is normal. No respiratory distress.     Breath sounds: Normal breath sounds. No wheezing.  Abdominal:     General: Bowel sounds are normal. There is no distension.     Palpations: Abdomen is soft.  Musculoskeletal:        General: No deformity. Normal range of motion.     Cervical back: Normal range of motion and neck supple.  Skin:    General: Skin is warm and dry.     Findings: No erythema or rash.  Neurological:     Mental Status: She is alert and oriented to person, place, and time. Mental status is at baseline.  Psychiatric:        Mood and Affect: Mood normal.     LABORATORY DATA:  I have reviewed the data as listed    Latest Ref Rng & Units 09/17/2024    4:17 AM 09/16/2024    6:17 AM 09/15/2024    5:37 AM  CBC  WBC 4.0 - 10.5 K/uL 8.4  11.6  7.6   Hemoglobin 12.0 - 15.0 g/dL 88.0  88.4  87.4   Hematocrit 36.0 - 46.0 % 35.4  34.1  35.5  Platelets 150 - 400 K/uL 224  251  267       Latest Ref Rng & Units 09/17/2024    4:17 AM 09/16/2024    6:17 AM 09/15/2024    5:37 AM  CMP  Glucose 70 - 99 mg/dL 91  895  837   BUN 6 - 20 mg/dL 13  17  19    Creatinine 0.44 - 1.00 mg/dL 9.36  9.38  9.27   Sodium 135 - 145 mmol/L 141  140  137   Potassium 3.5 - 5.1 mmol/L 3.6  3.9  4.3   Chloride 98 - 111 mmol/L 106  110  104   CO2 22 - 32 mmol/L 24  24  21    Calcium  8.9 - 10.3 mg/dL 8.5  8.5  9.2       RADIOGRAPHIC STUDIES: I have personally reviewed the radiological images as listed and agreed with the findings in the report. NM Myocar Multi W/Spect W/Wall Motion / EF Result Date: 10/08/2024 Pharmacological myocardial perfusion imaging study with no significant  ischemia Normal wall motion, EF estimated at 66% No EKG changes concerning for ischemia at peak stress or in recovery. CT attenuation correction images with no significant coronary  calcification or aortic atherosclerosis Low risk scan Signed, Velinda Lunger, MD, Ph.D [facility]   DG Chest 2 View Result Date: 09/14/2024 EXAM: 2 VIEW(S) XRAY OF THE CHEST 09/14/2024 03:05:15 PM COMPARISON: Comparison 04/12/2024. CLINICAL HISTORY: CP. FINDINGS: LUNGS AND PLEURA: No focal pulmonary opacity. No pleural effusion. No pneumothorax. HEART AND MEDIASTINUM: No acute abnormality of the cardiac and mediastinal silhouettes. BONES AND SOFT TISSUES: No acute osseous abnormality. IMPRESSION: 1. No acute cardiopulmonary process. Electronically signed by: Lynwood Seip MD 09/14/2024 03:26 PM EST RP Workstation: [id number]   ECHOCARDIOGRAM COMPLETE Result Date: 08/30/2024    ECHOCARDIOGRAM REPORT   Patient Name:   Lauren Escobar Date of Exam: 08/30/2024 Medical Rec #:  [id number]         Height:       61.5 in Accession #:    [id number]        Weight:       163.0 lb Date of Birth:  1966/03/03        BSA:          1.742 m Patient Age:    58 years          BP:           116/84 mmHg Patient Gender: F                 HR:           91 bpm. Exam Location:  [facility] Procedure: 2D Echo, Cardiac Doppler and Color Doppler (Both Spectral and Color            Flow Doppler were utilized during procedure). Indications:     Dyspnea R06.00                  centrilobular emphysema J43.2  History:         Patient has no prior history of Echocardiogram examinations.                  COPD; Risk Factors:Hypertension. Tobacco use.  Sonographer:     Christopher Furnace Referring Phys:  [id number] KHABIB DGAYLI Diagnosing Phys: Deatrice Cage MD  Sonographer Comments: Image acquisition challenging due to COPD. IMPRESSIONS  1. Left ventricular ejection fraction, by estimation, is 55 to 60%. The left ventricle has  normal function. The left ventricle has no regional wall motion abnormalities. There is mild left ventricular hypertrophy. Left ventricular diastolic parameters are consistent with Grade I diastolic dysfunction (impaired relaxation).  2.  Right ventricular systolic function is normal. The right ventricular size is normal. Tricuspid regurgitation signal is inadequate for assessing PA pressure.  3. The mitral valve is normal in structure. No evidence of mitral valve regurgitation. No evidence of mitral stenosis.  4. The aortic valve is normal in structure. Aortic valve regurgitation is not visualized. No aortic stenosis is present.  5. The inferior vena cava is normal in size with greater than 50% respiratory variability, suggesting right atrial pressure of 3 mmHg. FINDINGS  Left Ventricle: Left ventricular ejection fraction, by estimation, is 55 to 60%. The left ventricle has normal function. The left ventricle has no regional wall motion abnormalities. The left ventricular internal cavity size was normal in size. There is  mild left ventricular hypertrophy. Left ventricular diastolic parameters are consistent with Grade I diastolic dysfunction (impaired relaxation). Right Ventricle: The right ventricular size is normal. No increase in right ventricular wall thickness. Right ventricular systolic function is normal. Tricuspid regurgitation signal is inadequate for assessing PA pressure. Left Atrium: Left atrial size was normal in size. Right Atrium: Right atrial size was normal in size. Pericardium: There is no evidence of pericardial effusion. Mitral Valve: The mitral valve is normal in structure. No evidence of mitral valve regurgitation. No evidence of mitral valve stenosis. MV peak gradient, 5.1 mmHg. The mean mitral valve gradient is 2.0 mmHg. Tricuspid Valve: The tricuspid valve is normal in structure. Tricuspid valve regurgitation is not demonstrated. No evidence of tricuspid stenosis. Aortic Valve: The aortic valve is normal in structure. Aortic valve regurgitation is not visualized. No aortic stenosis is present. Aortic valve mean gradient measures 2.0 mmHg. Aortic valve peak gradient measures 2.4 mmHg. Aortic valve area, by VTI measures 3.34  cm. Pulmonic Valve: The pulmonic valve was normal in structure. Pulmonic valve regurgitation is not visualized. No evidence of pulmonic stenosis. Aorta: The aortic root is normal in size and structure. Venous: The inferior vena cava is normal in size with greater than 50% respiratory variability, suggesting right atrial pressure of 3 mmHg. IAS/Shunts: No atrial level shunt detected by color flow Doppler.  LEFT VENTRICLE PLAX 2D LVIDd:         3.57 cm   Diastology LVIDs:         2.46 cm   LV e' medial:    4.45 cm/s LV PW:         1.23 cm   LV E/e' medial:  12.1 LV IVS:        1.29 cm   LV e' lateral:   10.90 cm/s LVOT diam:     2.00 cm   LV E/e' lateral: 4.9 LV SV:         42 LV SV Index:   24 LVOT Area:     3.14 cm LV IVRT:       79 msec  RIGHT VENTRICLE RV Basal diam:  3.32 cm RV Mid diam:    3.58 cm RV S prime:     8.90 cm/s TAPSE (M-mode): 1.6 cm LEFT ATRIUM           Index        RIGHT ATRIUM           Index LA diam:      2.80 cm 1.61 cm/m   RA Area:  10.60 cm LA Vol (A2C): 22.4 ml 12.86 ml/m  RA Volume:   21.70 ml  12.46 ml/m LA Vol (A4C): 10.8 ml 6.20 ml/m  AORTIC VALVE AV Area (Vmax):    2.67 cm AV Area (Vmean):   2.34 cm AV Area (VTI):     3.34 cm AV Vmax:           77.50 cm/s AV Vmean:          61.200 cm/s AV VTI:            0.126 m AV Peak Grad:      2.4 mmHg AV Mean Grad:      2.0 mmHg LVOT Vmax:         65.80 cm/s LVOT Vmean:        45.600 cm/s LVOT VTI:          0.134 m LVOT/AV VTI ratio: 1.06  AORTA Ao Root diam: 2.80 cm MITRAL VALVE               TRICUSPID VALVE MV Area (PHT): 6.90 cm    TR Peak grad:   6.0 mmHg MV Area VTI:   2.29 cm    TR Vmax:        122.00 cm/s MV Peak grad:  5.1 mmHg MV Mean grad:  2.0 mmHg    SHUNTS MV Vmax:       1.13 m/s    Systemic VTI:  0.13 m MV Vmean:      71.5 cm/s   Systemic Diam: 2.00 cm MV Decel Time: 110 msec MV E velocity: 53.80 cm/s MV A velocity: 44.15 cm/s MV E/A ratio:  1.22 Deatrice Cage MD Electronically signed by Deatrice Cage MD Signature  Date/Time: 08/30/2024/1:09:23 PM    Final          "

## 2024-12-01 ENCOUNTER — Ambulatory Visit: Admitting: Student in an Organized Health Care Education/Training Program

## 2024-12-13 ENCOUNTER — Ambulatory Visit: Admitting: Student in an Organized Health Care Education/Training Program

## 2024-12-30 ENCOUNTER — Ambulatory Visit: Admitting: Physician Assistant

## 2025-02-23 ENCOUNTER — Inpatient Hospital Stay: Admitting: Oncology

## 2025-02-23 ENCOUNTER — Inpatient Hospital Stay
